# Patient Record
Sex: Male | Born: 1945
Health system: Southern US, Community
[De-identification: ages and names within clinical notes are randomized; demographics above are authoritative.]

## PROBLEM LIST (undated history)

## (undated) DIAGNOSIS — A0472 Enterocolitis due to Clostridium difficile, not specified as recurrent: Secondary | ICD-10-CM

## (undated) DIAGNOSIS — E785 Hyperlipidemia, unspecified: Secondary | ICD-10-CM

## (undated) DIAGNOSIS — K219 Gastro-esophageal reflux disease without esophagitis: Secondary | ICD-10-CM

## (undated) DIAGNOSIS — I739 Peripheral vascular disease, unspecified: Secondary | ICD-10-CM

## (undated) DIAGNOSIS — E039 Hypothyroidism, unspecified: Secondary | ICD-10-CM

## (undated) DIAGNOSIS — Z8619 Personal history of other infectious and parasitic diseases: Secondary | ICD-10-CM

## (undated) DIAGNOSIS — H9319 Tinnitus, unspecified ear: Secondary | ICD-10-CM

## (undated) DIAGNOSIS — R011 Cardiac murmur, unspecified: Secondary | ICD-10-CM

## (undated) DIAGNOSIS — C20 Malignant neoplasm of rectum: Secondary | ICD-10-CM

## (undated) DIAGNOSIS — K420 Umbilical hernia with obstruction, without gangrene: Secondary | ICD-10-CM

## (undated) DIAGNOSIS — M1990 Unspecified osteoarthritis, unspecified site: Secondary | ICD-10-CM

## (undated) DIAGNOSIS — C189 Malignant neoplasm of colon, unspecified: Secondary | ICD-10-CM

## (undated) DIAGNOSIS — D649 Anemia, unspecified: Secondary | ICD-10-CM

## (undated) DIAGNOSIS — K921 Melena: Secondary | ICD-10-CM

## (undated) DIAGNOSIS — B029 Zoster without complications: Secondary | ICD-10-CM

## (undated) DIAGNOSIS — R200 Anesthesia of skin: Secondary | ICD-10-CM

## (undated) DIAGNOSIS — IMO0001 Reserved for inherently not codable concepts without codable children: Secondary | ICD-10-CM

## (undated) DIAGNOSIS — I340 Nonrheumatic mitral (valve) insufficiency: Secondary | ICD-10-CM

## (undated) DIAGNOSIS — H9190 Unspecified hearing loss, unspecified ear: Secondary | ICD-10-CM

## (undated) DIAGNOSIS — I10 Essential (primary) hypertension: Secondary | ICD-10-CM

## (undated) DIAGNOSIS — I8290 Acute embolism and thrombosis of unspecified vein: Secondary | ICD-10-CM

## (undated) DIAGNOSIS — J329 Chronic sinusitis, unspecified: Secondary | ICD-10-CM

## (undated) DIAGNOSIS — IMO0002 Reserved for concepts with insufficient information to code with codable children: Secondary | ICD-10-CM

## (undated) DIAGNOSIS — Z932 Ileostomy status: Secondary | ICD-10-CM

## (undated) DIAGNOSIS — R351 Nocturia: Secondary | ICD-10-CM

## (undated) DIAGNOSIS — I00 Rheumatic fever without heart involvement: Secondary | ICD-10-CM

## (undated) HISTORY — DX: Cardiac murmur, unspecified: R01.1

## (undated) HISTORY — DX: Ileostomy status: Z93.2

## (undated) HISTORY — PX: TONSILLECTOMY: SUR1361

## (undated) HISTORY — DX: Enterocolitis due to Clostridium difficile, not specified as recurrent: A04.72

## (undated) HISTORY — DX: Malignant neoplasm of rectum: C20

## (undated) HISTORY — PX: INGUINAL HERNIA REPAIR: SUR1180

## (undated) HISTORY — DX: Nonrheumatic mitral (valve) insufficiency: I34.0

## (undated) HISTORY — DX: Essential (primary) hypertension: I10

## (undated) HISTORY — DX: Hyperlipidemia, unspecified: E78.5

---

## 2000-06-06 HISTORY — PX: CARPAL TUNNEL RELEASE: SHX101

## 2002-09-11 ENCOUNTER — Ambulatory Visit (HOSPITAL_COMMUNITY): Admission: RE | Admit: 2002-09-11 | Discharge: 2002-09-11 | Payer: Self-pay | Admitting: Cardiology

## 2003-10-08 ENCOUNTER — Ambulatory Visit (HOSPITAL_COMMUNITY): Admission: RE | Admit: 2003-10-08 | Discharge: 2003-10-08 | Payer: Self-pay | Admitting: Cardiology

## 2011-11-15 ENCOUNTER — Other Ambulatory Visit: Payer: Self-pay | Admitting: Cardiology

## 2012-07-11 ENCOUNTER — Encounter: Payer: Self-pay | Admitting: Cardiology

## 2012-07-27 ENCOUNTER — Encounter (INDEPENDENT_AMBULATORY_CARE_PROVIDER_SITE_OTHER): Payer: Self-pay

## 2012-07-27 ENCOUNTER — Encounter (INDEPENDENT_AMBULATORY_CARE_PROVIDER_SITE_OTHER): Payer: Self-pay | Admitting: Surgery

## 2012-07-27 ENCOUNTER — Telehealth: Payer: Self-pay | Admitting: Oncology

## 2012-07-27 ENCOUNTER — Ambulatory Visit (INDEPENDENT_AMBULATORY_CARE_PROVIDER_SITE_OTHER): Payer: Medicare Other | Admitting: Surgery

## 2012-07-27 VITALS — BP 128/78 | HR 80 | Resp 18 | Ht 70.0 in | Wt 187.0 lb

## 2012-07-27 DIAGNOSIS — R152 Fecal urgency: Secondary | ICD-10-CM | POA: Insufficient documentation

## 2012-07-27 DIAGNOSIS — C20 Malignant neoplasm of rectum: Secondary | ICD-10-CM

## 2012-07-27 DIAGNOSIS — I10 Essential (primary) hypertension: Secondary | ICD-10-CM

## 2012-07-27 DIAGNOSIS — R42 Dizziness and giddiness: Secondary | ICD-10-CM | POA: Insufficient documentation

## 2012-07-27 LAB — CBC WITH DIFFERENTIAL/PLATELET
Basophils Absolute: 0.1 10*3/uL (ref 0.0–0.1)
Basophils Relative: 1 % (ref 0–1)
Eosinophils Absolute: 0.5 10*3/uL (ref 0.0–0.7)
Lymphs Abs: 1.4 10*3/uL (ref 0.7–4.0)
MCH: 32.1 pg (ref 26.0–34.0)
MCHC: 34 g/dL (ref 30.0–36.0)
Neutrophils Relative %: 69 % (ref 43–77)
Platelets: 257 10*3/uL (ref 150–400)
RBC: 4.14 MIL/uL — ABNORMAL LOW (ref 4.22–5.81)
RDW: 14 % (ref 11.5–15.5)

## 2012-07-27 MED ORDER — PRAMOXINE HCL 1 % RE FOAM: RECTAL | Status: DC | PRN

## 2012-07-27 NOTE — Patient Instructions (Signed)
Work to get your x-ray and blood studies to help stage the rectal cancer better  We will discuss her case on the GI tumor board with the rest of the specialist.  Meet with the medical and radiation oncology cancer doctors.  Try Proctofoam for the rectal discomfort.  Colorectal Cancer Colorectal cancer is an abnormal growth of tissue (tumor) in the colon or rectum that is cancerous (malignant). Unlike noncancerous (benign) tumors, malignant tumors can spread to other parts of your body. The colon is the large bowel or large intestine. The rectum is the last several inches of the colon. CAUSES  The exact cause of colon cancer is unknown.  RISK FACTORS The majority of patients do not have identifiable risk factors, but the following factors may increase your chances of getting colon cancer:  Age. Most colorectal cancers occur in people older than 50 years.  Having abnormal growths (polyps) on the inner wall of the colon or rectum.  Diabetes.  Being African American.  Family history of hereditary nonpolyposis colon cancer. This condition is caused by changes in the genes that are responsible for repairing mismatched DNA.  Family history of familial adenomatous polyposis (FAP). This is a rare, inherited condition in which hundreds of polyps form in the colon and rectum. It is caused by a change in the APC gene. Unless FAP is treated, it usually leads to colorectal cancer by age 57.  Personal history of cancer. A person who has already had colorectal cancer may develop it a second time. Also, women with a history of ovarian, uterine, or breast cancer are at a somewhat higher risk of developing colorectal cancer.  Inflammatory bowel disease, including ulcerative colitis and Crohn's disease.  Being obese or eating a diet that is high in fat (especially animal fat) and low in fiber, fruits, and vegetables.  Smoking. A person who smokes cigarettes may be at increased risk of developing polyps  and colorectal cancer.  Heavy alcohol use. SYMPTOMS Early colorectal cancer often does not cause symptoms. As the cancer grows, symptoms may include:  Diarrhea.  Constipation.  Feeling like the bowel does not empty completely after a bowel movement.  Blood in the stool.  Stools that are narrower than usual.  Abdominal discomfort, pain, bloating, fullness, or cramps.  Unexplained weight loss.  Constant tiredness.  Nausea and vomiting. DIAGNOSIS  Your caregiver will ask about your medical history. He or she may also perform a number of procedures, such as:  A physical exam.  Blood tests. This may include a routine complete blood count and iron level testing. Your caregiver may also check for carcinoembryonic antigen (CEA) and other substances in the blood. Some people who have colorectal cancer have a high CEA level. These levels may be used to follow the activity of your colon cancer.  Chest X-rays, computed tomography (CT) scans, or magnetic resonance imaging (MRI).  Taking a tissue sample (biopsy) from the colon or rectum. The sample is examined under a microscope to look for cancer cells.  Sigmoidoscopy. With this test, your caregiver can see inside your colon. A thin flexible tube (sigmoidoscope) is placed into your rectum. This device has a light source and a tiny video camera in it. Your caregiver uses the sigmoidoscope to look at the last third of your colon.  Colonoscopy. This test is like sigmoidoscopy, but your caregiver looks at the entire colon. This test usually requires medicine that helps you relax (sedative).  Endorectal ultrasound. With this test, your caregiver can see  how deep a rectal tumor has grown and whether the cancer has spread to lymph nodes or other nearby tissues. A tool (probe) is inserted into the rectum. The probe sends out sound waves to the rectum and nearby tissues, and a computer uses the echoes to create a picture. Your cancer will be staged  to determine its severity and extent. Staging is a careful attempt to find out the size of the tumor, whether the cancer has spread, and if so, to what parts of the body. You may need to have more tests to determine the stage of your cancer. The test results will help determine what treatment plan is best for you. STAGES  Stage 0. The cancer is found only in the innermost lining of the colon or rectum.  Stage I. The cancer has grown into the inner wall of the colon or rectum. The cancer has not yet reached the outer wall of the colon.  Stage II. The cancer extends more deeply into or through the wall of the colon or rectum. It may have invaded nearby tissue, but cancer cells have not spread to the lymph nodes.  Stage III. The cancer has spread to nearby lymph nodes but not to other parts of the body.  Stage IV. The cancer has spread to other parts of the body, such as the liver or lungs. Your caregiver may tell you the detailed stage of your cancer. In that case, the stage will include both a number and a letter. TREATMENT  Depending on the type and stage, colorectal cancer may be treated with surgery, radiation therapy, or chemotherapy. Some patients have a combination of these therapies.  Surgery may be done to remove the polyps from your colon. In early stages, your caregiver may be able to do this during a colonoscopy. In later stages, surgery may be done to remove part of your colon.  Radiation therapy uses high-energy rays to kill cancer cells. This is usually recommended for patients with rectal cancer.  Chemotherapy is the use of drugs to kill cancer cells. Caregivers also give chemotherapy to help reduce pain and other problems caused by colorectal cancer. This may be done even if the cancer is not curable. HOME CARE INSTRUCTIONS   Only take over-the-counter or prescription medicines for pain, discomfort, or fever as directed by your caregiver.  Maintain a healthy diet.  Consider  joining a support group. This may help you learn to cope with the stress of having colorectal cancer.  Seek advice to help you manage treatment side effects.  Keep all follow-up appointments as directed by your caregiver.  Inform your cancer specialist if you are admitted to the hospital. SEEK IMMEDIATE MEDICAL CARE IF:   Your diarrhea or constipation does not go away.  You have alternating constipation and diarrhea.  You have blood in your stools.  Your abdominal pain gets worse.  You lose weight without trying.  You notice new fatigue or weakness.  You develop a fever during chemotherapy treatment. Document Released: 05/23/2005 Document Revised: 08/15/2011 Document Reviewed: 05/10/2011 [facility] Patient Information 2013 [place], [place].  Proctitis Proctitis is the swelling and soreness (inflammation) of the lining of the rectum. The rectum is at the end of the large intestine and is attached to the anus. The inflammation causes pain and discomfort. It may be short-term (acute) or long-lasting (chronic). CAUSES Inflammation in the rectum can be caused by many things, such as:  Sexually transmitted diseases (STDs).  Infection.  Anal-rectal trauma or injury.  Ulcerative colitis or Crohn's disease.  Radiation therapy directed near the rectum.  Antibiotic therapy. SYMPTOMS  Sudden, uncomfortable, and frequent urge to have a bowel movement.  Anal or rectal pain.  Abdominal cramping or pain.  Sensation that the rectum is full.  Rectal bleeding.  Pus or mucus discharge from anus.  Diarrhea or frequent soft, loose stools. DIAGNOSIS Diagnosis may include the following:  A history and physical exam.  An STD test.  Blood tests.  Stool tests.  Rectal culture.  A procedure to evaluate the anal canal (anoscopy).  Procedures to look at part, or the entire large bowel (sigmoidoscopy, colonoscopy). TREATMENT Treatment of proctitis depends on the cause. Reducing  the symptoms of inflammation and eliminating infection are the main goals of treatment. Treatment may include:  Home remedies and lifestyle, such as sitz baths and avoiding food right before bedtime.  Topical ointments, foams, suppositories, or enemas, such as corticosteroids or anti-inflammatories.  Antibiotic or antiviral medicines to treat infection or to control harmful bacteria.  Medicines to control diarrhea, soften stools, and reduce pain.  Medicines to suppress the immune system.  Avoiding the activity that caused rectal trauma.  Nutritional, dietary, or herbal supplements.  Heat or laser therapy for persistent bleeding.  A dilation procedure to enlarge a narrowed rectum.  Surgery, though rare, may be necessary to repair damaged rectal lining. HOME CARE INSTRUCTIONS Only take medicines that are recommended or approved by your caregiver.Do not take anti-diarrhea medicine without your caregiver's approval. SEEK MEDICAL CARE IF:  You often experience one or more of the symptoms noted above.  You keep experiencing symptoms after treatment.  You have questions or concerns about your symptoms or treatment plan. MAKE SURE YOU:  Understand these instructions.  Will watch your condition.  Will get help right away if you are not doing well or get worse. FOR MORE INFORMATION National Institute of Diabetes and Digestive and Kidney Disease (NIDDK): www.digestive.https://bradley.com/ Document Released: 05/12/2011 Document Revised: 08/15/2011 Document Reviewed: 05/12/2011 [facility] Patient Information 2013 [place], [place].  GETTING TO GOOD BOWEL HEALTH. Irregular bowel habits such as constipation and diarrhea can lead to many problems over time.  Having one soft bowel movement a day is the most important way to prevent further problems.  The anorectal canal is designed to handle stretching and feces to safely manage our ability to get rid of solid waste (feces, poop, stool) out of our  body.  BUT, hard constipated stools can act like ripping concrete bricks and diarrhea can be a burning fire to this very sensitive area of our body, causing inflamed hemorrhoids, anal fissures, increasing risk is perirectal abscesses, abdominal pain/bloating, an making irritable bowel worse.     The goal: ONE SOFT BOWEL MOVEMENT A DAY!  To have soft, regular bowel movements:    Drink at least 8 tall glasses of water a day.     Take plenty of fiber.  Fiber is the undigested part of plant food that passes into the colon, acting s "natures broom" to encourage bowel motility and movement.  Fiber can absorb and hold large amounts of water. This results in a larger, bulkier stool, which is soft and easier to pass. Work gradually over several weeks up to 6 servings a day of fiber (25g a day even more if needed) in the form of: o Vegetables -- Root (potatoes, carrots, turnips), leafy green (lettuce, salad greens, celery, spinach), or cooked high residue (cabbage, broccoli, etc) o Fruit -- Fresh (unpeeled skin & pulp), Dried (  prunes, apricots, cherries, etc ),  or stewed ( applesauce)  o Whole grain breads, pasta, etc (whole wheat)  o Bran cereals    Bulking Agents -- This type of water-retaining fiber generally is easily obtained each day by one of the following:  o Psyllium bran -- The psyllium plant is remarkable because its ground seeds can retain so much water. This product is available as Metamucil, Konsyl, Effersyllium, Per Diem Fiber, or the less expensive generic preparation in drug and health food stores. Although labeled a laxative, it really is not a laxative.  o Methylcellulose -- This is another fiber derived from wood which also retains water. It is available as Citrucel. o Polyethylene Glycol - and "artificial" fiber commonly called Miralax or Glycolax.  It is helpful for people with gassy or bloated feelings with regular fiber o Flax Seed - a less gassy fiber than psyllium   No reading or other  relaxing activity while on the toilet. If bowel movements take longer than 5 minutes, you are too constipated   AVOID CONSTIPATION.  High fiber and water intake usually takes care of this.  Sometimes a laxative is needed to stimulate more frequent bowel movements, but    Laxatives are not a good long-term solution as it can wear the colon out. o Osmotics (Milk of Magnesia, Fleets phosphosoda, Magnesium citrate, MiraLax, GoLytely) are safer than  o Stimulants (Senokot, Castor Oil, Dulcolax, Ex Lax)    o Do not take laxatives for more than 7days in a row.    IF SEVERELY CONSTIPATED, try a Bowel Retraining Program: o Do not use laxatives.  o Eat a diet high in roughage, such as bran cereals and leafy vegetables.  o Drink six (6) ounces of prune or apricot juice each morning.  o Eat two (2) large servings of stewed fruit each day.  o Take one (1) heaping tablespoon of a psyllium-based bulking agent twice a day. Use sugar-free sweetener when possible to avoid excessive calories.  o Eat a normal breakfast.  o Set aside 15 minutes after breakfast to sit on the toilet, but do not strain to have a bowel movement.  o If you do not have a bowel movement by the third day, use an enema and repeat the above steps.    Controlling diarrhea o Switch to liquids and simpler foods for a few days to avoid stressing your intestines further. o Avoid dairy products (especially milk & ice cream) for a short time.  The intestines often can lose the ability to digest lactose when stressed. o Avoid foods that cause gassiness or bloating.  Typical foods include beans and other legumes, cabbage, broccoli, and dairy foods.  Every person has some sensitivity to other foods, so listen to our body and avoid those foods that trigger problems for you. o Adding fiber (Citrucel, Metamucil, psyllium, Miralax) gradually can help thicken stools by absorbing excess fluid and retrain the intestines to act more normally.  Slowly increase  the dose over a few weeks.  Too much fiber too soon can backfire and cause cramping & bloating. o Probiotics (such as active yogurt, Align, etc) may help repopulate the intestines and colon with normal bacteria and calm down a sensitive digestive tract.  Most studies show it to be of mild help, though, and such products can be costly. o Medicines:   Bismuth subsalicylate (ex. Kayopectate, Pepto Bismol) every 30 minutes for up to 6 doses can help control diarrhea.  Avoid if pregnant.  Loperamide (Immodium) can slow down diarrhea.  Start with two tablets (4mg  total) first and then try one tablet every 6 hours.  Avoid if you are having fevers or severe pain.  If you are not better or start feeling worse, stop all medicines and call your doctor for advice o Call your doctor if you are getting worse or not better.  Sometimes further testing (cultures, endoscopy, X-ray studies, bloodwork, etc) may be needed to help diagnose and treat the cause of the diarrhea.

## 2012-07-27 NOTE — Telephone Encounter (Signed)
np appt. With Dr. Truett Perna 08/03/12@1 :30 Referring Dr Michaell Cowing Dx- Rectal Mailed np appt. Almira Coaster called pt

## 2012-07-27 NOTE — Progress Notes (Signed)
Subjective:     Patient ID: Tyler Diaz, male   DOB: January 28, 1946, 67 y.o.   MRN: [id number]  HPI  Tyler Diaz  24-Jul-1945 [id number]  Patient Care Team: Horald Pollen, PA as PCP - General (Physician Assistant) Ernestina Penna, MD as Attending Physician (Family Medicine) Ardeth Sportsman, MD as Consulting Physician (Colon and Rectal Surgery) Shirley Friar, MD as Consulting Physician (Gastroenterology)  This patient is a 67 y.o.male who presents today for surgical evaluation at the request of Dr. Bosie Clos.   Reason for visit: Newly diagnosed rectal cancer  Pleasant gentleman with a history of a "leaky valve.  Followed by Dr. Donnie Aho.  Normally has a bowel movement every two days.  Noticed a change in caliber of her stools and rectal bleeding over this past month.  Has been on aspirin and nonsteroidals.  Stop them.  It persisted.  More fecal urgency and discomfort.  Was sent to gastroenterology.  Dr. Bosie Clos performed colonoscopy noted a large bulky mass in the rectum.  15 cm long.  Coming down to the anus.  Biopsy consistent with cancer.  Patient does yard work and is rather active.  Claims to walk 20 minutes without much problem.  Mother died of some unknown abdominal cancer.  Was not resectable do too numerous organs involved (?  Ovarian).  Sister with liver cancer.  No personal nor family history of GI/colon cancer for certain, inflammatory bowel disease, irritable bowel syndrome, allergy such as Celiac Sprue, dietary/dairy problems, colitis, ulcers nor gastritis.  No recent sick contacts/gastroenteritis.  No travel outside the country.  No changes in diet.    Patient Active Problem List  Diagnosis  . Rectal cancer  . HTN (hypertension)  . Vertigo    Past Medical History  Diagnosis Date  . Heart murmur   . HTN (hypertension)   . Headache   . Vertigo     Past Surgical History  Procedure Laterality Date  . Inguinal hernia repair      left side  . Hand surgery  2002     History   Social History  . Marital Status: Married    Spouse Name: N/A    Number of Children: N/A  . Years of Education: N/A   Occupational History  . Not on file.   Social History Main Topics  . Smoking status: Former Smoker    Quit date: 07/27/1972  . Smokeless tobacco: Not on file  . Alcohol Use: No  . Drug Use: No  . Sexually Active: Not on file   Other Topics Concern  . Not on file   Social History Narrative  . No narrative on file    History reviewed. No pertinent family history.  Current Outpatient Prescriptions  Medication Sig Dispense Refill  . hydrochlorothiazide (HYDRODIURIL) 25 MG tablet Take 25 mg by mouth daily.      Marland Kitchen lisinopril (PRINIVIL,ZESTRIL) 20 MG tablet Take 20 mg by mouth daily.      . pravastatin (PRAVACHOL) 20 MG tablet Take 20 mg by mouth daily.       No current facility-administered medications for this visit.     Not on File  BP 128/78  Pulse 80  Resp 18  Ht 5\' 10"  (1.778 m)  Wt 187 lb (84.823 kg)  BMI 26.83 kg/m2  No results found.   Review of Systems  Constitutional: Positive for fatigue. Negative for fever, chills and diaphoresis.  HENT: Positive for hearing loss. Negative for nosebleeds, sore throat,  facial swelling, mouth sores, trouble swallowing and ear discharge.   Eyes: Negative for photophobia, discharge and visual disturbance.  Respiratory: Negative for choking, chest tightness, shortness of breath and stridor.   Cardiovascular: Negative for chest pain and palpitations.  Gastrointestinal: Positive for anal bleeding and rectal pain. Negative for nausea, vomiting, abdominal pain, diarrhea, constipation, blood in stool and abdominal distention.  Endocrine: Negative for cold intolerance and heat intolerance.  Genitourinary: Negative for dysuria, urgency, difficulty urinating and testicular pain.  Musculoskeletal: Positive for arthralgias. Negative for myalgias, back pain and gait problem.  Skin: Negative for color  change, pallor, rash and wound.  Allergic/Immunologic: Negative for environmental allergies and food allergies.  Neurological: Negative for dizziness, speech difficulty, weakness, numbness and headaches.  Hematological: Negative for adenopathy. Bruises/bleeds easily.  Psychiatric/Behavioral: Negative for hallucinations, confusion and agitation.       Objective:   Physical Exam  Constitutional: He is oriented to person, place, and time. He appears well-developed and well-nourished. No distress.  HENT:  Head: Normocephalic.  Mouth/Throat: Oropharynx is clear and moist. No oropharyngeal exudate.  Eyes: Conjunctivae and EOM are normal. Pupils are equal, round, and reactive to light. No scleral icterus.  Neck: Normal range of motion. Neck supple. No tracheal deviation present.  Cardiovascular: Normal rate, regular rhythm and intact distal pulses.   Pulmonary/Chest: Effort normal and breath sounds normal. No respiratory distress.  Abdominal: Soft. He exhibits no distension. There is no tenderness. Hernia confirmed negative in the right inguinal area and confirmed negative in the left inguinal area.  Genitourinary:  Exam done with assistance of male Medical Assistant in the room.  Perianal skin clean with good hygiene.  No pruritis.  Posterior midline anal skin tag.  No other external skin tags / hemorrhoids of significance.  No pilonidal disease.  No fissure.  No abscess/fistula.    Tolerates digital rectal exam.  Normal sphincter tone.   Large bulky tumor involving >50% circumference left anterior to posterior midline.  Somewhat fixed.  3 cm from anal verge.  Within 1 cm of the anal sphincters  Musculoskeletal: Normal range of motion. He exhibits no tenderness.  Lymphadenopathy:    He has no cervical adenopathy.       Right: No inguinal adenopathy present.       Left: No inguinal adenopathy present.  Neurological: He is alert and oriented to person, place, and time. No cranial nerve  deficit. He exhibits normal muscle tone. Coordination normal.  Skin: Skin is warm and dry. No rash noted. He is not diaphoretic. No erythema. No pallor.  Psychiatric: He has a normal mood and affect. His behavior is normal. Judgment and thought content normal.       Assessment:     Large bulky rectal cancer coming close to the sphincters.  Most likely at least T3.     Plan:     I had a long discussion with the patient and his family.  They had many good, appropriate questions.  Try sitz baths and Proctofoam to help with the fecal urgency/proctitis.  Evaluation of local extension:    Given how bulky, uncomfortable/painful, and distal this is, I think an MRI of the pelvis would provide best information.  I do not think he can tolerate an EUS.  Evaluation for distant disease:  CEA, CT chest abdomen pelvis.  GI tumor board evaluation once that information is fair.  Medical and radiation oncology evaluations for neoadjuvant chemoradiation therapy.  He is hoping to have it done up in  more had hospital/[place].  We can coordinate that later.  However, I recommended he get discussion of care at our multidisciplinary conference in [place].  He and his family agree.  This will require proctectomy.  Borderline of sphincter preservation will be possible.  Reasonable to start with neoadjuvant chemoradiation therapy and see if he can be downstaged and provide a little more distance between the sphincters.  Would need at least protective loop ileostomy with coloanal handsewn anastomosis vs. Abdominoperineal resection.  Preoperative ostomy marking will be needed.  The anatomy & physiology of the digestive tract was discussed.  The pathophysiology was discussed.  Natural history risks without surgery was discussed.   I worked to give an overview of the disease and the frequent need to have multispecialty involvement.  I feel the risks of no intervention will lead to serious problems that outweigh the  operative risks; therefore, I recommended a partial proctocolectomy to remove the pathology.  Laparoscopic & open techniques were discussed.  We will work to preserve anal & pelvic floor function without sacrificing cure.  Risks such as bleeding, infection, abscess, leak, reoperation, possible ostomy, hernia, heart attack, death, and other risks were discussed.  I noted a good likelihood this will help address the problem.   Goals of post-operative recovery were discussed as well.  We will work to minimize complications.  An educational handout on the pathology was given as well.  Questions were answered.    The patient expresses understanding & wishes to proceed with surgery.

## 2012-07-28 LAB — COMPREHENSIVE METABOLIC PANEL
ALT: 16 U/L (ref 0–53)
Albumin: 4.3 g/dL (ref 3.5–5.2)
Alkaline Phosphatase: 66 U/L (ref 39–117)
CO2: 28 mEq/L (ref 19–32)
Glucose, Bld: 103 mg/dL — ABNORMAL HIGH (ref 70–99)
Potassium: 4.2 mEq/L (ref 3.5–5.3)
Sodium: 138 mEq/L (ref 135–145)
Total Protein: 6.4 g/dL (ref 6.0–8.3)

## 2012-07-30 ENCOUNTER — Telehealth: Payer: Self-pay | Admitting: *Deleted

## 2012-07-30 ENCOUNTER — Ambulatory Visit (INDEPENDENT_AMBULATORY_CARE_PROVIDER_SITE_OTHER): Payer: Self-pay | Admitting: Surgery

## 2012-07-30 ENCOUNTER — Encounter: Payer: Self-pay | Admitting: Cardiology

## 2012-07-30 ENCOUNTER — Telehealth (INDEPENDENT_AMBULATORY_CARE_PROVIDER_SITE_OTHER): Payer: Self-pay | Admitting: *Deleted

## 2012-07-30 DIAGNOSIS — I340 Nonrheumatic mitral (valve) insufficiency: Secondary | ICD-10-CM | POA: Insufficient documentation

## 2012-07-30 DIAGNOSIS — E781 Pure hyperglyceridemia: Secondary | ICD-10-CM | POA: Insufficient documentation

## 2012-07-30 DIAGNOSIS — E785 Hyperlipidemia, unspecified: Secondary | ICD-10-CM | POA: Insufficient documentation

## 2012-07-30 NOTE — Progress Notes (Signed)
Patient ID: [name], male   DOB: Apr 12, 1946, 67 y.o.   MRN: [id number]    DATE: 07/30/2012         Maanav Dougher              [address]              [place], [place]  [address]      PATIENT: Tyler Diaz, Tyler Diaz ACCOUNT: [id number] Date of Birth: 1945-12-10      MAY PROCEED WITH SURGERY from a cardiac viewpoint on the above named patient. If you have any other concerns please contact us at the number above.  He should be monitored on telemetry following surgery.     Georga Hacking,  M.D.   FACC

## 2012-07-30 NOTE — Telephone Encounter (Signed)
Spoke with patient by phone and confirmed appointments with Dr.Sherrill and Dr. Mitzi Hansen for 08/03/12.  Patient stated he wanted to get the opinions of the physicians in [place], but would be interested in receiving care in [place] at [facility].  Contact names and phone numbers were provided.  Patient was without questions.

## 2012-07-30 NOTE — Progress Notes (Signed)
Patient ID: AIVAN FILLINGIM, male   DOB: [birth year], 67 y.o.   MRN: [id number]  Jeb, Schloemer  Date of visit:  07/11/2012 DOB:  1945-06-29    Age:  67 yrs. Medical record number:  [id number]     Account number:  [id number] Primary Care Provider: Rudi Heap ____________________________ CURRENT DIAGNOSES  1. Mitral Valve-Regurgitation  2. Hypertension-Essential (Benign)  3. Hyperlipidemia ____________________________ ALLERGIES  iodine  Shellfish ____________________________ MEDICATIONS  1. Afrin 0.025%, PRN  2. Centrum Silver tablet, 1 p.o. daily  3. lisinopril 20 mg tablet, 2 p.o. daily  4. pravastatin 20 mg tablet, 1 p.o. daily  5. hydrochlorothiazide 25 mg tablet, 1 p.o. daily ____________________________ CHIEF COMPLAINTS  Followup of Hypertension-Essential (Benign)  Followup of Mitral Valve-Regurgitation ____________________________ HISTORY OF PRESENT ILLNESS  Patient seen for cardiac followup. He tells me he has been having some hematochezia and tenesmus and has seen his primary physician. He is due to have a colonoscopy coming up that is trying to be scheduled. He does note some fatigue but does not have any PND or orthopnea. He does not have claudication. He has been able to do recently vigorous work. He does have a known partially flail posterior mitral valve leaflet.____________________________ PAST HISTORY  Past Medical Illnesses:  hypertension, asthma, rheumatic fever;  Cardiovascular Illnesses:  mitral regurgitation due to partially torn leaflet;  Surgical Procedures:  carpal tunnel release, inguinal herniorrhaphy-left, tonsillectomy;  Cardiology Procedures-Invasive:  no previous interventional or invasive cardiology procedures;  Cardiology Procedures-Noninvasive:  echocardiogram December 2012, echocardiogram February 2014;  LVEF of 60% documented via echocardiogram on 07/11/2012 ____________________________ CARDIO-PULMONARY TEST DATES EKG Date:  11/18/2010;  Echocardiography  Date: 07/11/2012;   ____________________________ SOCIAL HISTORY Alcohol Use:  no alcohol use;  Smoking:  used to smoke but quit Prior to 1980;  Diet:  regular diet without modifications;  Lifestyle:  married;  Exercise:  no regular exercise;  Occupation:  retired Development worker, international aid;  Residence:  lives with wife and son also at home;   ____________________________ REVIEW OF SYSTEMS General:  denies recent weight change, fatique or change in exercise tolerance.  Ears, Nose, Throat, Mouth:  partial hearing loss, tinnitus, seasonal sinusitis  Respiratory:  denies dyspnea, cough, wheezing or hemoptysis.  Cardiovascular:  please review HPI  Abdominal:  rectal bleeding  Genitourinary-Male:  nocturia  Musculoskeletal:  arthritis of the hands, arthritis of the left knee   ____________________________ PHYSICAL EXAMINATION VITAL SIGNS  Blood Pressure:  100/60 Sitting, Left arm, regular cuff  , 104/62 Standing, Left arm and regular cuff   Pulse:  120/min. Weight:  188.00 lbs. Height:  70"BMI: 27  Constitutional:  pleasant white male in no acute distress Skin:  warm and dry to touch, no apparent skin lesions, or masses noted. Head:  normocephalic, balding male hair pattern ENT:  ears, nose and throat reveal no gross abnormalities.  Dentition good. Neck:  supple, no masses, thyromegaly, JVD. Carotid pulses are full and equal bilaterally without bruits. Chest:  normal symmetry, clear to auscultation and percussion. Cardiac:  regular rhythm, grade 3/6 systolic murmur left sternal border radiating to the apex radiating to the base Peripheral Pulses:  the femoral,dorsalis pedis, and posterior tibial pulses are full and equal bilaterally with no bruits auscultated. Extremities & Back:  no deformities, clubbing, cyanosis, erythema or edema observed. Normal muscle strength and tone. Neurological:  no gross motor or sensory deficits noted, affect appropriate, oriented x3. ____________________________ MOST  RECENT LIPID PANEL 11/14/11  CHOL TOTL 165 mg/dl, LDL 99 calc,  HDL 31 mg/dl, TRIGLYCER 161 mg/dl and CHOL/HDL 5.3 (Calc) ____________________________ IMPRESSIONS/PLAN  1. Mitral regurgitation due to partially torn posterior mitral valve leaflet 2. Hypertension controlled 3. Recent hematochezia  Recommendations:  He is in the process of having his hematochezia evaluated. My recommendations are for him to get this out of the way and taken care of. We discussed what to do about his partially flail posterior leaflet. On echocardiogram today his ventricular dimensions are preserved. He is somewhat more fatigued than he was previously and his atrial size is not huge. He would need to have probable surgical repair of this but is really not that symptomatic. He really wanted to get the other thing taken care of first and  we will visit this at his appointment down the road. If he needs surgery to address the hematochezia, he would be an acceptable candidate for this.  ____________________________ TODAYS ORDERS  1. Return Visit: 6 months  2. 12 Lead EKG: 6 months                       ____________________________ Cardiology Physician:  Darden Palmer MD [facility]

## 2012-07-30 NOTE — Telephone Encounter (Signed)
Patient called to state prescription for Pramoxine 1% is $83 and he can not afford prescription.  Spoke to pharmacist who recommended Hydrocortisone 2.5% cream every 4 hours as needed for hemorrhoids.  Use for rectal pain.  Spoke to Prince MD who approved change in prescription.  Patient informed of new prescription and will go to pick up.

## 2012-07-31 ENCOUNTER — Encounter (INDEPENDENT_AMBULATORY_CARE_PROVIDER_SITE_OTHER): Payer: Self-pay

## 2012-08-02 ENCOUNTER — Ambulatory Visit (HOSPITAL_COMMUNITY)
Admission: RE | Admit: 2012-08-02 | Discharge: 2012-08-02 | Disposition: A | Discharge status: Home / Self Care | Payer: Medicare Other | Source: Ambulatory Visit | Attending: Surgery | Admitting: Surgery

## 2012-08-02 ENCOUNTER — Encounter: Payer: Self-pay | Admitting: Radiation Oncology

## 2012-08-02 ENCOUNTER — Encounter (HOSPITAL_COMMUNITY): Payer: Self-pay

## 2012-08-02 ENCOUNTER — Ambulatory Visit (HOSPITAL_COMMUNITY): Payer: Medicare Other

## 2012-08-02 DIAGNOSIS — C20 Malignant neoplasm of rectum: Secondary | ICD-10-CM | POA: Insufficient documentation

## 2012-08-02 DIAGNOSIS — K8020 Calculus of gallbladder without cholecystitis without obstruction: Secondary | ICD-10-CM | POA: Insufficient documentation

## 2012-08-02 DIAGNOSIS — R42 Dizziness and giddiness: Secondary | ICD-10-CM

## 2012-08-02 DIAGNOSIS — I10 Essential (primary) hypertension: Secondary | ICD-10-CM

## 2012-08-02 DIAGNOSIS — Q619 Cystic kidney disease, unspecified: Secondary | ICD-10-CM | POA: Insufficient documentation

## 2012-08-02 DIAGNOSIS — C775 Secondary and unspecified malignant neoplasm of intrapelvic lymph nodes: Secondary | ICD-10-CM | POA: Insufficient documentation

## 2012-08-02 DIAGNOSIS — R911 Solitary pulmonary nodule: Secondary | ICD-10-CM | POA: Insufficient documentation

## 2012-08-02 DIAGNOSIS — D3500 Benign neoplasm of unspecified adrenal gland: Secondary | ICD-10-CM | POA: Insufficient documentation

## 2012-08-02 MED ORDER — IOHEXOL 300 MG/ML  SOLN
100.0000 mL | Freq: Once | INTRAMUSCULAR | Status: AC | PRN
  Administered 2012-08-02: 100 mL via INTRAVENOUS

## 2012-08-02 MED ORDER — GADOBENATE DIMEGLUMINE 529 MG/ML IV SOLN
17.0000 mL | Freq: Once | INTRAVENOUS | Status: AC | PRN
  Administered 2012-08-02: 17 mL via INTRAVENOUS

## 2012-08-02 NOTE — Progress Notes (Signed)
New Consult Rectal Cancer Bx 05/17/13= 15 cm length,  Invasive Adenocarcinoma with extracellular mucin Married,1 son,Retired Scientist, research (physical sciences)  Allergies: Shellfish  No history Radiation No history of a Pacemaker

## 2012-08-03 ENCOUNTER — Ambulatory Visit (HOSPITAL_BASED_OUTPATIENT_CLINIC_OR_DEPARTMENT_OTHER): Payer: Medicare Other | Admitting: Oncology

## 2012-08-03 ENCOUNTER — Ambulatory Visit (HOSPITAL_BASED_OUTPATIENT_CLINIC_OR_DEPARTMENT_OTHER): Payer: Medicare Other

## 2012-08-03 ENCOUNTER — Encounter: Payer: Self-pay | Admitting: Radiation Oncology

## 2012-08-03 ENCOUNTER — Ambulatory Visit
Admission: RE | Admit: 2012-08-03 | Discharge: 2012-08-03 | Disposition: A | Discharge status: Home / Self Care | Payer: Medicare Other | Source: Ambulatory Visit | Attending: Radiation Oncology | Admitting: Radiation Oncology

## 2012-08-03 ENCOUNTER — Other Ambulatory Visit: Payer: Self-pay | Admitting: *Deleted

## 2012-08-03 ENCOUNTER — Encounter: Payer: Self-pay | Admitting: Oncology

## 2012-08-03 VITALS — BP 139/80 | HR 96 | Temp 97.6°F | Resp 18 | Ht 69.0 in | Wt 187.3 lb

## 2012-08-03 VITALS — BP 139/80 | HR 96 | Temp 97.4°F | Resp 18 | Ht 70.75 in | Wt 188.3 lb

## 2012-08-03 DIAGNOSIS — R911 Solitary pulmonary nodule: Secondary | ICD-10-CM

## 2012-08-03 DIAGNOSIS — C20 Malignant neoplasm of rectum: Secondary | ICD-10-CM | POA: Insufficient documentation

## 2012-08-03 DIAGNOSIS — Z79899 Other long term (current) drug therapy: Secondary | ICD-10-CM | POA: Insufficient documentation

## 2012-08-03 DIAGNOSIS — K6289 Other specified diseases of anus and rectum: Secondary | ICD-10-CM

## 2012-08-03 DIAGNOSIS — I10 Essential (primary) hypertension: Secondary | ICD-10-CM | POA: Insufficient documentation

## 2012-08-03 DIAGNOSIS — E785 Hyperlipidemia, unspecified: Secondary | ICD-10-CM | POA: Insufficient documentation

## 2012-08-03 DIAGNOSIS — Z87891 Personal history of nicotine dependence: Secondary | ICD-10-CM | POA: Insufficient documentation

## 2012-08-03 DIAGNOSIS — R970 Elevated carcinoembryonic antigen [CEA]: Secondary | ICD-10-CM

## 2012-08-03 DIAGNOSIS — C775 Secondary and unspecified malignant neoplasm of intrapelvic lymph nodes: Secondary | ICD-10-CM | POA: Insufficient documentation

## 2012-08-03 HISTORY — DX: Rheumatic fever without heart involvement: I00

## 2012-08-03 HISTORY — DX: Chronic sinusitis, unspecified: J32.9

## 2012-08-03 HISTORY — DX: Unspecified osteoarthritis, unspecified site: M19.90

## 2012-08-03 HISTORY — DX: Tinnitus, unspecified ear: H93.19

## 2012-08-03 HISTORY — DX: Unspecified hearing loss, unspecified ear: H91.90

## 2012-08-03 HISTORY — DX: Melena: K92.1

## 2012-08-03 HISTORY — DX: Malignant neoplasm of colon, unspecified: C18.9

## 2012-08-03 MED ORDER — POLYETHYLENE GLYCOL 3350 17 GM/SCOOP PO POWD: 17.0000 g | Freq: Every day | ORAL | Status: DC

## 2012-08-03 MED ORDER — TRAMADOL HCL 50 MG PO TABS: 50.0000 mg | ORAL_TABLET | Freq: Four times a day (QID) | ORAL | Status: DC | PRN

## 2012-08-03 NOTE — Progress Notes (Signed)
Checked in new patient. No financial issues. °

## 2012-08-03 NOTE — Progress Notes (Signed)
See progress note under physician encounter. 

## 2012-08-03 NOTE — Progress Notes (Signed)
[facility] New Patient Consult   Referring MD: Zidan Helget 67 y.o.  1945/08/03    Reason for Referral:  Rectal cancer      HPI: He noticed rectal urgency approximately 4 months ago. He developed rectal bleeding 2 months ago. He was referred to Dr. Bosie Clos and underwent a colonoscopy on 07/18/2012. Nodular mucosa was palpated on rectal exam and a fungating mass lesion was noted in the rectum. The mass measured 15 cm in length and appeared to be near the dentate line. A biopsy was obtained. A sessile polyp was noted in the cecum. A sessile polyp was found in the proximal a sending colon. 2 small polyps were noted in the ascending colon, resection not attempted.  The pathology from the rectum biopsy confirmed invasive adenocarcinoma. The cecal and ascending colon polyps were tubular adenomas.  He was referred to Dr. Michaell Cowing and was noted to have a large tumor in the rectum at 3 cm from the anal verge within 1 cm of the anal sphincters.  He is referred to consider neoadjuvant therapy.  CTs of the chest, abdomen, and pelvis on 08/02/2012 revealed small bilateral pulmonary nodules measuring up to 6 mm. The liver appeared normal. Rectal mass with suspected infiltration of the surrounding perirectal fat. Associated perirectal lymph node metastases. 10 mm short axis gastrohepatic node.  An MRI of the pelvis on 08/02/2012 confirmed a bulky rectal mass abutting the posterior margin of the prostate gland. Perirectal lymphadenopathy was seen bilaterally with the largest node measuring 11 mm.   He complains of rectal pain that is partially relieved with Tylenol.  Past Medical History  Diagnosis Date  . Mitral regurgitation due to partially flail posterior mitral valve leaflet   . Rectal cancer  07/18/2012       . Hyperlipidemia           . Hypertension     essential (benign)  . Rheumatic fever  childhood     hx  . Hearing loss     partial greater on left    . Tinnitus   . Sinusitis     seasonal  .        . Arthritis     hands/knees  .        Marland Kitchen      Past Surgical History  Procedure Laterality Date  . Inguinal hernia repair  1982    left side  . Carpal tunnel release  2002  . Tonsillectomy      as child    Family History  Problem Relation Age of Onset  . Cancer Mother      "stomach "  . Cancer Sister     liver cancer    Current outpatient prescriptions:hydrochlorothiazide (HYDRODIURIL) 25 MG tablet, Take 25 mg by mouth daily., Disp: , Rfl: ;  hydrocortisone 2.5 % cream, , Disp: , Rfl: ;  lisinopril (PRINIVIL,ZESTRIL) 20 MG tablet, Take 20 mg by mouth 2 (two) times daily. , Disp: , Rfl: ;  pravastatin (PRAVACHOL) 20 MG tablet, Take 20 mg by mouth daily., Disp: , Rfl:  polyethylene glycol powder (GLYCOLAX/MIRALAX) powder, Take 17 g by mouth daily. Use 17 g in water and drink every day, Disp: 527 g, Rfl: 1;  traMADol (ULTRAM) 50 MG tablet, Take 1 tablet (50 mg total) by mouth every 6 (six) hours as needed for pain., Disp: 30 tablet, Rfl: 2  Allergies:  Allergies  Allergen Reactions  . Shellfish Allergy  Social History: He lives in [place], he is retired from a Arts development officer. He quit smoking cigarettes 35 years ago. No alcohol use. No transfusion history. No risk factors for HIV or hepatitis.  ROS:   Positives include: Recent "cold ", rectal pain, rectal bleeding, rectal urgency, pruritus at the left lateral chest wall and right leg  A complete ROS was otherwise negative.  Physical Exam:  Blood pressure 139/80, pulse 96, temperature 97.4 F (36.3 C), temperature source Oral, resp. rate 18, height 5' 10.75" (1.797 m), weight 188 lb 4.8 oz (85.412 kg).  HEENT: Oropharynx without visible mass, neck without mass Lungs: Clear bilaterally Cardiac: Regular rate and rhythm, 2/6 systolic murmur heard throughout the precordium and best at the apex Abdomen: No hepatomegaly, nontender, no mass GU: Testes without mass  Vascular: No  leg edema Lymph nodes: No cervical, supra-clavicular, axillary, or inguinal nodes Neurologic: Alert and oriented, the motor exam appears intact in the upper and lower extremities Skin: No rash   LAB:  CBC  Lab Results  Component Value Date   WBC 10.3 07/27/2012   HGB 13.3 07/27/2012   HCT 39.1 07/27/2012   MCV 94.4 07/27/2012   PLT 257 07/27/2012   ANC 7.2  CMP      Component Value Date/Time   NA 138 07/27/2012 1425   K 4.2 07/27/2012 1425   CL 100 07/27/2012 1425   CO2 28 07/27/2012 1425   GLUCOSE 103* 07/27/2012 1425   BUN 23 07/27/2012 1425   CREATININE 1.25 07/27/2012 1425   CALCIUM 9.5 07/27/2012 1425   PROT 6.4 07/27/2012 1425   ALBUMIN 4.3 07/27/2012 1425   AST 13 07/27/2012 1425   ALT 16 07/27/2012 1425   ALKPHOS 66 07/27/2012 1425   BILITOT 0.6 07/27/2012 1425   CEA-12.9  Radiology: As per history of present illness, I reviewed the 08/02/2012 CT images with Mr. Jain and his family    Assessment/Plan:   1. Rectal cancer, locally advanced-clinical stage III disease based on CT/pelvic MRI evaluations 2. Small bilateral lung nodules and mildly enlarged gastrohepatic node-nonspecific 3. Elevated CEA 4. Rectal pain/urgency/bleeding 5. Mitral regurgitation 6. Hypertension 7. History of rheumatic fever   Disposition:   He has been diagnosed with locally advanced rectal cancer. I discussed the diagnosis and treatment options with Mr. Dansereau and his family. He appears to have locally advanced disease based on the CT/MRI staging studies. I recommend neoadjuvant radiation and concurrent Xeloda.  I reviewed the potential toxicities associated with Xeloda including a chance for mucositis, diarrhea, and hematologic toxicity. We discussed the skin rash, hyperpigmentation, and hand/foot syndrome associated with Xeloda.  He is scheduled to see Dr. Mitzi Hansen later today.  Mr. Dubray indicated he would like to receive this treatment in [place]. We will make a referral to Dr. Ubaldo Glassing. Dr. Mitzi Hansen  will arrange for a radiation oncology referral.  We prescribed tramadol to use as needed for pain. He will continue MiraLAX. Mr. Harewood is not scheduled for a followup appointment at the [facility]. I will be glad to see him in the future as needed.  Lauro Manlove 08/03/2012, 5:40 PM

## 2012-08-03 NOTE — Progress Notes (Signed)
Patient here with wife and 2 sons for radiation consultation of newly diagnosed rectal cancer.Plan is for concurrent chemo/radiation (xeloda)Patient want radiation to be done in [place] as this is closer to where he lives.

## 2012-08-04 NOTE — Progress Notes (Signed)
Radiation Oncology         ([phone]) [phone] ________________________________  Name: Tyler Diaz MRN: [id number]  Date: 08/03/2012  DOB: November 08, 1945  JY:NWGNF,AOZHYQ C., PA  Michaell Cowing Salvatore Decent., MD   Lance Bosch, MD   Earma Reading, MD  REFERRING PHYSICIAN: Michaell Cowing Salvatore Decent., MD   DIAGNOSIS: The encounter diagnosis was Rectal cancer.   HISTORY OF PRESENT ILLNESS::Tyler Diaz is a 67 y.o. male who is seen for an initial consultation visit. The patient indicates that he had an episode of rectal urgency approximately 4 months ago. Since that time he has noticed some rectal bleeding and a change in the caliber of his stools. This has been present for the last couple of months. This prompted further workup and the patient underwent a colonoscopy on 07/18/2012. An ulcerated partially obstructing large mass was found within the rectum. This was partially circumferential and the mass measured 15 cm in length. This appeared to be near the dentate line and extend to 15 cm from the anus. Biopsies were performed and these have returned positive for an invasive adenocarcinoma with extracellular mucin. A cecal polyp as well as a colon polyp from the ACE in the: Both did not display malignant features.  The patient's workup has further included a CT scan of the chest abdomen and pelvis. Some small nonspecific subcentimeter bilateral pulmonary nodules were present. A rectal mass was seen with suspected infiltration of the surrounding perirectal fat within the pelvis. There was also associated perirectal nodal metastases measuring up to 10 mm short axis. There was an additional 10 mm short axis nonspecific gastrohepatic node. Further workup has included an MRI scan of the pelvis. This showed a bulky carcinoma involving the distal rectum with a length of approximately 7 cm and extension to the anorectal junction. There is also bilateral peri-rectal lymphadenopathy.  The patient has seen Dr. Michaell Cowing who has examined the  patient and feels that neoadjuvant chemoradiotherapy would be an appropriate consideration. The patient has seen Dr. Truett Perna in medical oncology earlier today and I been asked to see the patient as well in this regard.  The patient is currently taking MiraLax and also has been given some stronger pain medicine. He does complain of some irritation and occasional more severe pain in the pelvic region.   PREVIOUS RADIATION THERAPY: No   PAST MEDICAL HISTORY:  has a past medical history of Mitral regurgitation due to partially flail posterior mitral valve leaflet; Rectal cancer; Bronchitis; Hyperlipidemia; Colon cancer (07/18/12 bx); Hypertension; Rheumatic fever; Hearing loss; Tinnitus; Sinusitis; Rectal bleeding; Arthritis; Hematochezia; and Heart murmur.     PAST SURGICAL HISTORY: Past Surgical History  Procedure Laterality Date  . Inguinal hernia repair  1982    left side  . Carpal tunnel release  2002  . Tonsillectomy      as child     FAMILY HISTORY: family history includes Cancer in his mother and sister.   SOCIAL HISTORY:  reports that he quit smoking about 40 years ago. His smoking use included Cigarettes. He has a 30 pack-year smoking history. He has never used smokeless tobacco. He reports that he does not drink alcohol or use illicit drugs.   ALLERGIES: Shellfish allergy   MEDICATIONS:  Current Outpatient Prescriptions  Medication Sig Dispense Refill  . hydrochlorothiazide (HYDRODIURIL) 25 MG tablet Take 25 mg by mouth daily.      . hydrocortisone 2.5 % cream       . lisinopril (PRINIVIL,ZESTRIL) 20 MG tablet Take 20 mg  by mouth 2 (two) times daily.       . polyethylene glycol powder (GLYCOLAX/MIRALAX) powder Take 17 g by mouth daily. Use 17 g in water and drink every day  527 g  1  . pravastatin (PRAVACHOL) 20 MG tablet Take 20 mg by mouth daily.      . traMADol (ULTRAM) 50 MG tablet Take 1 tablet (50 mg total) by mouth every 6 (six) hours as needed for pain.  30  tablet  2   No current facility-administered medications for this encounter.     REVIEW OF SYSTEMS:  A 15 point review of systems is documented in the electronic medical record. This was obtained by the nursing staff. However, I reviewed this with the patient to discuss relevant findings and make appropriate changes.  Pertinent items are noted in HPI.    PHYSICAL EXAM:  height is 5\' 9"  (1.753 m) and weight is 187 lb 4.8 oz (84.959 kg). His oral temperature is 97.6 F (36.4 C). His blood pressure is 139/80 and his pulse is 96. His respiration is 18.   General: Well-developed, in no acute distress HEENT: Normocephalic, atraumatic; oral cavity clear Neck: Supple without any lymphadenopathy Cardiovascular: Regular rate and rhythm Respiratory: Clear to auscultation bilaterally GI: Soft, nontender, normal bowel sounds Extremities: No edema present Neuro: No focal deficits Rectal: deferred at patient's request    LABORATORY DATA:  Lab Results  Component Value Date   WBC 10.3 07/27/2012   HGB 13.3 07/27/2012   HCT 39.1 07/27/2012   MCV 94.4 07/27/2012   PLT 257 07/27/2012   Lab Results  Component Value Date   NA 138 07/27/2012   K 4.2 07/27/2012   CL 100 07/27/2012   CO2 28 07/27/2012   Lab Results  Component Value Date   ALT 16 07/27/2012   AST 13 07/27/2012   ALKPHOS 66 07/27/2012   BILITOT 0.6 07/27/2012      RADIOGRAPHY: Ct Chest W Contrast  08/02/2012  *RADIOLOGY REPORT*  Clinical Data:  Newly diagnosed rectal cancer  CT CHEST, ABDOMEN AND PELVIS WITH CONTRAST  Technique:  Multidetector CT imaging of the chest, abdomen and pelvis was performed following the standard protocol during bolus administration of intravenous contrast.  Contrast: OMNIPAQUE IOHEXOL 300 MG/ML  SOLN  Comparison:  None.  CT CHEST  Findings:  Small bilateral pulmonary nodules, including: --2 mm nodule in the right upper lobe (series 5/image 16) --4 mm nodule in the posterior right upper lobe (series 5/image  19) --3 mm nodule in the left upper lobe (series 5/image 27) --6 x 5 mm nodule in the right lower lobe (series 5/image 32) --3 mm nodule in the right lower lobe (series 5/image 38)  No pleural effusion or pneumothorax.  The visualized thyroid is unremarkable.  The heart is normal in size.  No pericardial effusion.  Small mediastinal lymph nodes which do not meet pathologic CT size criteria.  No suspicious hilar or axillary lymphadenopathy.  Mild degenerative changes of the thoracic spine.  IMPRESSION: Small bilateral pulmonary nodules, measuring up to 6 mm in the right lower lobe, as described above.  While nonspecific, metastases are not excluded.  Attention on follow-up is suggested.  CT ABDOMEN AND PELVIS  Findings:  Small hiatal hernia.  Liver, spleen, pancreas, and left adrenal gland are within normal limits.  9 mm right adrenal myelolipoma (series 2/image 58).  Numerous gallstones (series 2/image 58), without associated inflammatory changes.  No intrahepatic or extrahepatic ductal dilatation.  7 mm  right renal cortical cyst (series 4/image 13).  Additional right renal sinus cysts. Left kidney is within normal limits.  No hydronephrosis.  No evidence of bowel obstruction.  Normal appendix.  Eccentric rectal mass, measuring at least 2.5 cm in thickness (series 2/image 119), compatible with known rectal cancer. Suspected infiltration of the surrounding perirectal fat (series 2/image 120).  Associated perirectal nodal metastases measuring up to 10 mm short axis (series 2/image 113).  These are within 4 mm of the mesorectal fascia.  10 mm short axis gastrohepatic node (series 2/image 55).  No evidence of abdominal aortic aneurysm.  No abdominopelvic ascites.  Prostatomegaly, measuring 5.9 cm in transverse dimension. Although adjacent to the primary rectal mass, there is no definite direct invasion.  Degenerative changes of the lumbar spine.  IMPRESSION: Rectal mass, corresponding to known rectal cancer, with  suspected infiltration of the surrounding perirectal fat.  Associated perirectal nodal metastases measuring up to 10 mm short axis, within 4 mm of the mesorectal fascia.  10 mm short-axis gastrohepatic node, nonspecific.  Attention on follow-up is suggested.  Cholelithiasis, without associated inflammatory changes.  9 mm right adrenal myelolipoma, benign.   Original Report Authenticated By: Charline Bills, M.D.    Mr Pelvis W Wo Contrast  08/02/2012  *RADIOLOGY REPORT*  Clinical Data: Newly diagnosed rectal carcinoma.  Staging.  MRI PELVIS WITHOUT AND WITH CONTRAST  Technique:  Multiplanar multisequence MR imaging of the pelvis was performed both before and after administration of intravenous contrast.  Contrast: 17mL MULTIHANCE GADOBENATE DIMEGLUMINE 529 MG/ML IV SOLN  Comparison: CT on 08/02/2012  Findings: A bulky rectal soft tissue mass is seen which measures approximately 7 cm in length and extends to the anorectal junction. This mass measures approximately 4.8 x 6.5 cm in maximum AP transverse dimensions and abuts the posterior margin of the prostate gland.  Perirectal lymphadenopathy is seen bilaterally, with largest lymph nodes measuring 11 mm in short axis and abutting the circumferential resection margin in the right lateral and posterolateral perirectal space. No other lymphadenopathy seen within the pelvis.  Mildly enlarged prostate gland is seen.  Seminal vesicles are symmetric. Moderate diverticulosis of the sigmoid colon is noted, however there is no evidence of diverticulitis.  IMPRESSION:  1.  Bulky carcinoma involving the distal rectum, with length of approximately 7 cm and extension to the anorectal junction. 2.  Bilateral perirectal lymphadenopathy, with largest lymph nodes measuring 11 mm and abutting the circumferential resection margin in the right lateral and posterolateral perirectal space. 3.  Mildly enlarged prostate.  The rectal carcinoma abuts the posterior aspect the prostate,  without evidence of invasion. 4.  Moderate sigmoid diverticulosis, without evidence of diverticulitis.   Original Report Authenticated By: Myles Rosenthal, M.D.    Ct Abdomen Pelvis W Contrast  08/02/2012  *RADIOLOGY REPORT*  Clinical Data:  Newly diagnosed rectal cancer  CT CHEST, ABDOMEN AND PELVIS WITH CONTRAST  Technique:  Multidetector CT imaging of the chest, abdomen and pelvis was performed following the standard protocol during bolus administration of intravenous contrast.  Contrast: OMNIPAQUE IOHEXOL 300 MG/ML  SOLN  Comparison:  None.  CT CHEST  Findings:  Small bilateral pulmonary nodules, including: --2 mm nodule in the right upper lobe (series 5/image 16) --4 mm nodule in the posterior right upper lobe (series 5/image 19) --3 mm nodule in the left upper lobe (series 5/image 27) --6 x 5 mm nodule in the right lower lobe (series 5/image 32) --3 mm nodule in the right lower lobe (series 5/image  38)  No pleural effusion or pneumothorax.  The visualized thyroid is unremarkable.  The heart is normal in size.  No pericardial effusion.  Small mediastinal lymph nodes which do not meet pathologic CT size criteria.  No suspicious hilar or axillary lymphadenopathy.  Mild degenerative changes of the thoracic spine.  IMPRESSION: Small bilateral pulmonary nodules, measuring up to 6 mm in the right lower lobe, as described above.  While nonspecific, metastases are not excluded.  Attention on follow-up is suggested.  CT ABDOMEN AND PELVIS  Findings:  Small hiatal hernia.  Liver, spleen, pancreas, and left adrenal gland are within normal limits.  9 mm right adrenal myelolipoma (series 2/image 58).  Numerous gallstones (series 2/image 58), without associated inflammatory changes.  No intrahepatic or extrahepatic ductal dilatation.  7 mm right renal cortical cyst (series 4/image 13).  Additional right renal sinus cysts. Left kidney is within normal limits.  No hydronephrosis.  No evidence of bowel obstruction.  Normal  appendix.  Eccentric rectal mass, measuring at least 2.5 cm in thickness (series 2/image 119), compatible with known rectal cancer. Suspected infiltration of the surrounding perirectal fat (series 2/image 120).  Associated perirectal nodal metastases measuring up to 10 mm short axis (series 2/image 113).  These are within 4 mm of the mesorectal fascia.  10 mm short axis gastrohepatic node (series 2/image 55).  No evidence of abdominal aortic aneurysm.  No abdominopelvic ascites.  Prostatomegaly, measuring 5.9 cm in transverse dimension. Although adjacent to the primary rectal mass, there is no definite direct invasion.  Degenerative changes of the lumbar spine.  IMPRESSION: Rectal mass, corresponding to known rectal cancer, with suspected infiltration of the surrounding perirectal fat.  Associated perirectal nodal metastases measuring up to 10 mm short axis, within 4 mm of the mesorectal fascia.  10 mm short-axis gastrohepatic node, nonspecific.  Attention on follow-up is suggested.  Cholelithiasis, without associated inflammatory changes.  9 mm right adrenal myelolipoma, benign.   Original Report Authenticated By: Charline Bills, M.D.        IMPRESSION: The patient is a pleasant 67 year old male who is presenting with locally advanced rectal cancer. On his workup thus far this appears to represent a least a T3 tumor with nodal involvement. I believe that the patient is a good candidate for neoadjuvant chemoradiotherapy followed by definitive surgical resection. No evidence of distant disease although there were a couple of findings for which further attention was recommended on future scans. The patient has seen Dr. Truett Perna who believes that concurrent xeloda would be appropriate for him. I discussed with the patient a potential 5-1/2 week course of radiotherapy. I did discuss with him in general the side effects and risks of treatment. We discussed in detail the rationale of this treatment in terms of  improvement in local control as well as giving [place] the best chance possible for a more limited surgical procedure. All of the patient's questions were answered.   PLAN: [place] if possible. I certainly think that this would be appropriate for him. I will therefore make a request for the patient to be seen by Dr. Thersa Salt and Dr. Truett Perna is also making a request for the patient to be seen by medical oncology.   The patient knows to contact our office if we can be of any further assistance.    I spent 60 minutes minutes face to face with the patient and more than 50% of that time was spent in counseling and/or  coordination of care.    ________________________________   Radene Gunning, MD, PhD

## 2012-08-04 NOTE — Addendum Note (Signed)
Encounter addended by: Jonna Coup, MD on: 08/04/2012  5:33 PM<BR>     Documentation filed: Problem List, Visit Diagnoses, Flowsheet VN, Notes Section

## 2012-08-06 ENCOUNTER — Telehealth: Payer: Self-pay | Admitting: *Deleted

## 2012-08-06 NOTE — Telephone Encounter (Signed)
Called patient to inform of appt. With Dr. Wynonia Hazard on March 7, arrival time - 10:15 am, spoke with patient's wife Corrie Dandy and she is aware of this appt.

## 2012-08-13 DIAGNOSIS — C20 Malignant neoplasm of rectum: Secondary | ICD-10-CM

## 2012-08-20 ENCOUNTER — Encounter: Payer: Medicare Other | Admitting: Internal Medicine

## 2012-08-20 DIAGNOSIS — C20 Malignant neoplasm of rectum: Secondary | ICD-10-CM

## 2012-08-24 ENCOUNTER — Encounter (INDEPENDENT_AMBULATORY_CARE_PROVIDER_SITE_OTHER): Payer: Self-pay | Admitting: Surgery

## 2012-09-03 ENCOUNTER — Encounter (INDEPENDENT_AMBULATORY_CARE_PROVIDER_SITE_OTHER): Payer: Medicare Other | Admitting: Internal Medicine

## 2012-09-03 DIAGNOSIS — C20 Malignant neoplasm of rectum: Secondary | ICD-10-CM

## 2012-10-01 ENCOUNTER — Encounter (INDEPENDENT_AMBULATORY_CARE_PROVIDER_SITE_OTHER): Payer: Medicare Other | Admitting: Internal Medicine

## 2012-10-01 DIAGNOSIS — C20 Malignant neoplasm of rectum: Secondary | ICD-10-CM

## 2012-10-25 ENCOUNTER — Other Ambulatory Visit (INDEPENDENT_AMBULATORY_CARE_PROVIDER_SITE_OTHER): Payer: Self-pay | Admitting: Surgery

## 2012-10-31 ENCOUNTER — Ambulatory Visit (INDEPENDENT_AMBULATORY_CARE_PROVIDER_SITE_OTHER): Payer: Medicare Other | Admitting: Surgery

## 2012-10-31 ENCOUNTER — Encounter (INDEPENDENT_AMBULATORY_CARE_PROVIDER_SITE_OTHER): Payer: Self-pay | Admitting: Surgery

## 2012-10-31 VITALS — BP 140/78 | HR 96 | Temp 97.4°F | Resp 16 | Ht 70.0 in | Wt 189.0 lb

## 2012-10-31 DIAGNOSIS — C20 Malignant neoplasm of rectum: Secondary | ICD-10-CM

## 2012-10-31 MED ORDER — NEOMYCIN SULFATE 500 MG PO TABS: 1000.0000 mg | ORAL_TABLET | ORAL | Status: DC

## 2012-10-31 MED ORDER — METRONIDAZOLE 500 MG PO TABS: 500.0000 mg | ORAL_TABLET | ORAL | Status: DC

## 2012-10-31 NOTE — Progress Notes (Signed)
Subjective:     Patient ID: Tyler Diaz, male   DOB: 11-03-1945, 67 y.o.   MRN: [id number]  HPI  Tyler Diaz  09/16/1945 [id number]  Patient Care Team: Ernestina Penna, MD as PCP - General (Family Medicine) Ernestina Penna, MD as Attending Physician (Family Medicine) Ardeth Sportsman, MD as Consulting Physician (Colon and Rectal Surgery) Shirley Friar, MD as Consulting Physician (Gastroenterology) Michae Kava, MD as Consulting Physician (Medical Oncology)  This patient is a 67 y.o.male who presents today for surgical evaluation.   Reason for visit: Followup after neoadjuvant chemoradiation therapy for rectal cancer  Patient returns today doing relatively well.  The razor blade burning feeling has faded away.  He completed chemotherapy and radiation therapy on April 24.  No incontinence to flatus or stool.  Some irregular bowel movements.  Alternating between using MiraLAX and Tylenol 3s.  No fevers or chills.  He comes today with his wife.  He is feeling a little stronger.  No new events.  Can walk 20 minutes straight  Patient Active Problem List   Diagnosis Date Noted  . Colon cancer   . Mitral regurgitation due to partially flail posterior mitral valve leaflet 07/30/2012  . Hyperlipidemia   . Rectal cancer 07/27/2012  . Hypertension   . Vertigo     Past Medical History  Diagnosis Date  . Mitral regurgitation due to partially flail posterior mitral valve leaflet   . Rectal cancer   . Bronchitis   . Hyperlipidemia   . Colon cancer 07/18/12 bx    rectum=Invasive adenocarcinoma w/ extracellular mucin,polyp=benign  . Hypertension     essential (benign)  . Rheumatic fever     hx  . Hearing loss     partial greater on left  . Tinnitus   . Sinusitis     seasonal  . Rectal bleeding     last month hx  . Arthritis     hands/knees  . Hematochezia     recent  . Heart murmur     Past Surgical History  Procedure Laterality Date  . Inguinal hernia repair  1982     left side  . Carpal tunnel release  2002  . Tonsillectomy      as child    History   Social History  . Marital Status: Married    Spouse Name: N/A    Number of Children: 2  . Years of Education: N/A   Occupational History  . Retired     Development worker, [employer]   Social History Main Topics  . Smoking status: Former Smoker -- 1.50 packs/day for 20 years    Types: Cigarettes    Quit date: 07/27/1972  . Smokeless tobacco: Never Used  . Alcohol Use: No  . Drug Use: No     Comment: quit smoking 35 years ago  . Sexually Active: Not on file   Other Topics Concern  . 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  Married    Family History  Problem Relation Age of Onset  . Cancer Mother     abdominal ca ?ovarian?  . Cancer Sister     liver cancer    Current Outpatient Prescriptions  Medication Sig Dispense Refill  . acetaminophen-codeine (TYLENOL #3) 300-30 MG per tablet       . hydrochlorothiazide (HYDRODIURIL) 25 MG tablet Take 25 mg by mouth daily.      Marland Kitchen  hydrocortisone 2.5 % cream       . lisinopril (PRINIVIL,ZESTRIL) 20 MG tablet Take 20 mg by mouth 2 (two) times daily.       . polyethylene glycol powder (GLYCOLAX/MIRALAX) powder Take 17 g by mouth daily. Use 17 g in water and drink every day  527 g  1  . pravastatin (PRAVACHOL) 20 MG tablet Take 20 mg by mouth daily.      . promethazine (PHENERGAN) 25 MG tablet       . traMADol (ULTRAM) 50 MG tablet Take 1 tablet (50 mg total) by mouth every 6 (six) hours as needed for pain.  30 tablet  2   No current facility-administered medications for this visit.     Allergies  Allergen Reactions  . Shellfish Allergy     BP 140/78  Pulse 96  Temp(Src) 97.4 F (36.3 C) (Temporal)  Resp 16  Ht 5\' 10"  (1.778 m)  Wt 189 lb (85.73 kg)  BMI 27.12 kg/m2  No results found. Danae Orleans, MD Fri Aug 24, 2012 9:20:35 AM EDT       **ADDENDUM** CREATED: 08/24/2012 09:10:10   TNM stage by MR is T4a, N2b, Mx. As previously noted, the rectal  carcinoma extends all the way to the anorectal junction. The  enlarged perirectal lymph nodes extend to (directly abut) the  circumferential resection margin, so the circumferential resection  margin is 0.  **END ADDENDUM** SIGNED BY: Jonny Ruiz A. Eppie Gibson, M.D.      Study Result    *RADIOLOGY REPORT*  Clinical Data: Newly diagnosed rectal carcinoma. Staging.  MRI PELVIS WITHOUT AND WITH CONTRAST  Technique: Multiplanar multisequence MR imaging of the pelvis was  performed both before and after administration of intravenous  contrast.  Contrast: 17mL MULTIHANCE GADOBENATE DIMEGLUMINE 529 MG/ML IV SOLN  Comparison: CT on 08/02/2012  Findings: A bulky rectal soft tissue mass is seen which measures  approximately 7 cm in length and extends to the anorectal junction.  This mass measures approximately 4.8 x 6.5 cm in maximum AP  transverse dimensions and abuts the posterior margin of the  prostate gland.  Perirectal lymphadenopathy is seen bilaterally, with largest lymph  nodes measuring 11 mm in short axis and abutting the  circumferential resection margin in the right lateral and  posterolateral perirectal space. No other lymphadenopathy seen  within the pelvis.  Mildly enlarged prostate gland is seen. Seminal vesicles are  symmetric. Moderate diverticulosis of the sigmoid colon is noted,  however there is no evidence of diverticulitis.  IMPRESSION:  1. Bulky carcinoma involving the distal rectum, with length of  approximately 7 cm and extension to the anorectal junction.  2. Bilateral perirectal lymphadenopathy, with largest lymph nodes  measuring 11 mm and abutting the circumferential resection margin  in the right lateral and posterolateral perirectal space.  3. Mildly enlarged prostate. The rectal carcinoma abuts the  posterior aspect the prostate, without evidence of invasion.  4. Moderate sigmoid diverticulosis,  without evidence of  diverticulitis.      Review of Systems  Constitutional: Negative for fever, chills and diaphoresis.  HENT: Negative for sore throat, trouble swallowing and neck pain.   Eyes: Negative for photophobia and visual disturbance.  Respiratory: Negative for choking and shortness of breath.   Cardiovascular: Negative for chest pain and palpitations.  Gastrointestinal: Negative for nausea, vomiting, abdominal distention and rectal pain.  Genitourinary: Negative for dysuria, urgency, difficulty urinating and testicular pain.  Musculoskeletal: Negative for myalgias, arthralgias and gait problem.  Skin: Negative for color change and rash.  Neurological: Negative for dizziness, speech difficulty, weakness and numbness.  Hematological: Negative for adenopathy.  Psychiatric/Behavioral: Negative for hallucinations, confusion and agitation.       Objective:   Physical Exam  Constitutional: He is oriented to person, place, and time. He appears well-developed and well-nourished. No distress.  HENT:  Head: Normocephalic.  Mouth/Throat: Oropharynx is clear and moist. No oropharyngeal exudate.  Eyes: Conjunctivae and EOM are normal. Pupils are equal, round, and reactive to light. No scleral icterus.  Neck: Normal range of motion. No tracheal deviation present.  Cardiovascular: Normal rate, normal heart sounds and intact distal pulses.   Pulmonary/Chest: Effort normal. No respiratory distress.  Abdominal: Soft. He exhibits no distension. There is no tenderness. Hernia confirmed negative in the right inguinal area and confirmed negative in the left inguinal area.  Genitourinary:  Exam done with assistance of male Medical Assistant in the room.  Perianal skin clean with good hygiene.  No pruritis.  No external skin tags / hemorrhoids of significance.  No pilonidal disease.  No fissure.  No abscess/fistula.  Tolerates digital anoscopic rectal exam.  Normal sphincter tone.    Bulky  tumor now with distal extention at posterior midline.  Somewhat mobile.  5 cm from anal verge is distal Hoby Kawai margin  Musculoskeletal: Normal range of motion. He exhibits no tenderness.  Neurological: He is alert and oriented to person, place, and time. No cranial nerve deficit. He exhibits normal muscle tone. Coordination normal.  Skin: Skin is warm and dry. No rash noted. He is not diaphoretic.  Psychiatric: He has a normal mood and affect. His behavior is normal.       Assessment:     Rectal cancer very distal status 4 weeks post neoadjuvant chemotherapy radiation therapy with some shrinkage.  Need for proctectomy.     Plan:     I am hopeful that we have a better chance at sphincter preservation.  However I stressed to him I would not sacrifice cure for that.  He and his wife understand.  They understand the need for an ostomy regardless of low anterior resection vs. APR.  Hopefully just temporary loop ileostomy.  I discussed in detail management of that.  I also gave and ostomy skills kit and Information on resources in the community.  We will get preoperative ileostomy marking.  Plan at 10 weeks = mid July.   I discussed the procedure of laparoscopic resection in detail:  The anatomy & physiology of the digestive tract was discussed.  The pathophysiology was discussed.  Natural history risks without surgery was discussed.   I worked to give an overview of the disease and the frequent need to have multispecialty involvement.  I feel the risks of no intervention will lead to serious problems that outweigh the operative risks; therefore, I recommended a partial proctocolectomy to remove the pathology.  Laparoscopic & open techniques were discussed.  We will work to preserve anal & pelvic floor function without sacrificing cure.  Risks such as bleeding, infection, abscess, leak, reoperation, possible ostomy, hernia, heart attack, death, and other risks were discussed.  I noted a good likelihood  this will help address the problem.   Goals of post-operative recovery were discussed as well.  We will work to minimize complications.  An educational handout on the pathology was given as well.  Questions were answered.    The patient expresses understanding & wishes to proceed with surgery.  He has cardiac  clearance from February.  No new event.  I think it still applies.      r

## 2012-10-31 NOTE — Patient Instructions (Addendum)
See the Handout(s) we gave you.  Consider surgery.  Please call our office at [phone] if you wish to schedule surgery or if you have further questions / concerns.   Colorectal Cancer Colorectal cancer is an abnormal growth of tissue (tumor) in the colon or rectum that is cancerous (malignant). Unlike noncancerous (benign) tumors, malignant tumors can spread to other parts of your body. The colon is the large bowel or large intestine. The rectum is the last several inches of the colon. CAUSES  The exact cause of colon cancer is unknown.  RISK FACTORS The majority of patients do not have identifiable risk factors, but the following factors may increase your chances of getting colon cancer:  Age. Most colorectal cancers occur in people older than 50 years.  Having abnormal growths (polyps) on the inner wall of the colon or rectum.  Diabetes.  Being African American.  Family history of hereditary nonpolyposis colon cancer. This condition is caused by changes in the genes that are responsible for repairing mismatched DNA.  Family history of familial adenomatous polyposis (FAP). This is a rare, inherited condition in which hundreds of polyps form in the colon and rectum. It is caused by a change in the APC gene. Unless FAP is treated, it usually leads to colorectal cancer by age 45.  Personal history of cancer. A person who has already had colorectal cancer may develop it a second time. Also, women with a history of ovarian, uterine, or breast cancer are at a somewhat higher risk of developing colorectal cancer.  Inflammatory bowel disease, including ulcerative colitis and Crohn's disease.  Being obese or eating a diet that is high in fat (especially animal fat) and low in fiber, fruits, and vegetables.  Smoking. A person who smokes cigarettes may be at increased risk of developing polyps and colorectal cancer.  Heavy alcohol use. SYMPTOMS Early colorectal cancer often does not  cause symptoms. As the cancer grows, symptoms may include:  Diarrhea.  Constipation.  Feeling like the bowel does not empty completely after a bowel movement.  Blood in the stool.  Stools that are narrower than usual.  Abdominal discomfort, pain, bloating, fullness, or cramps.  Unexplained weight loss.  Constant tiredness.  Nausea and vomiting. DIAGNOSIS  Your caregiver will ask about your medical history. He or she may also perform a number of procedures, such as:  A physical exam.  Blood tests. This may include a routine complete blood count and iron level testing. Your caregiver may also check for carcinoembryonic antigen (CEA) and other substances in the blood. Some people who have colorectal cancer have a high CEA level. These levels may be used to follow the activity of your colon cancer.  Chest X-rays, computed tomography (CT) scans, or magnetic resonance imaging (MRI).  Taking a tissue sample (biopsy) from the colon or rectum. The sample is examined under a microscope to look for cancer cells.  Sigmoidoscopy. With this test, your caregiver can see inside your colon. A thin flexible tube (sigmoidoscope) is placed into your rectum. This device has a light source and a tiny video camera in it. Your caregiver uses the sigmoidoscope to look at the last third of your colon.  Colonoscopy. This test is like sigmoidoscopy, but your caregiver looks at the entire colon. This test usually requires medicine that helps you relax (sedative).  Endorectal ultrasound. With this test, your caregiver can see how deep a rectal tumor has grown and whether the cancer has spread to lymph nodes or  other nearby tissues. A tool (probe) is inserted into the rectum. The probe sends out sound waves to the rectum and nearby tissues, and a computer uses the echoes to create a picture. Your cancer will be staged to determine its severity and extent. Staging is a careful attempt to find out the size of the  tumor, whether the cancer has spread, and if so, to what parts of the body. You may need to have more tests to determine the stage of your cancer. The test results will help determine what treatment plan is best for you. STAGES  Stage 0. The cancer is found only in the innermost lining of the colon or rectum.  Stage I. The cancer has grown into the inner wall of the colon or rectum. The cancer has not yet reached the outer wall of the colon.  Stage II. The cancer extends more deeply into or through the wall of the colon or rectum. It may have invaded nearby tissue, but cancer cells have not spread to the lymph nodes.  Stage III. The cancer has spread to nearby lymph nodes but not to other parts of the body.  Stage IV. The cancer has spread to other parts of the body, such as the liver or lungs. Your caregiver may tell you the detailed stage of your cancer. In that case, the stage will include both a number and a letter. TREATMENT  Depending on the type and stage, colorectal cancer may be treated with surgery, radiation therapy, or chemotherapy. Some patients have a combination of these therapies.  Surgery may be done to remove the polyps from your colon. In early stages, your caregiver may be able to do this during a colonoscopy. In later stages, surgery may be done to remove part of your colon.  Radiation therapy uses high-energy rays to kill cancer cells. This is usually recommended for patients with rectal cancer.  Chemotherapy is the use of drugs to kill cancer cells. Caregivers also give chemotherapy to help reduce pain and other problems caused by colorectal cancer. This may be done even if the cancer is not curable. HOME CARE INSTRUCTIONS   Only take over-the-counter or prescription medicines for pain, discomfort, or fever as directed by your caregiver.  Maintain a healthy diet.  Consider joining a support group. This may help you learn to cope with the stress of having colorectal  cancer.  Seek advice to help you manage treatment side effects.  Keep all follow-up appointments as directed by your caregiver.  Inform your cancer specialist if you are admitted to the hospital. SEEK IMMEDIATE MEDICAL CARE IF:   Your diarrhea or constipation does not go away.  You have alternating constipation and diarrhea.  You have blood in your stools.  Your abdominal pain gets worse.  You lose weight without trying.  You notice new fatigue or weakness.  You develop a fever during chemotherapy treatment. Document Released: 05/23/2005 Document Revised: 08/15/2011 Document Reviewed: 05/10/2011 [facility] Patient Information 2014 [place], [place].   COLON PREP INSTRUCTIONS for lower/distal colectomy:   Obtain what you need at a pharmacy of your choice:      Prescriptions for your oral antibiotics (Neomycin & Metronidazole)     A bottle of Milk of Magnesia    2 Fleet enemas (generic form OK to use)   DAY PRIOR TO SURGERY:    1:00pm    o Take 2 oz (4 tablespoons) Milk of Magnesia. o Take 2 Neomycin 500mg  tablets & 2 Metronidazole 500mg  tablets  3:00pm:    o Take 2 Neomycin 500mg  tablets & 2 Metronidazole 500mg  tablets     Bedtime (~10:00pm)  o Take 2 Neomycin 500mg  tablets & 2 Metronidazole 500mg  tablets    Midnight:  Do not eat or drink anything after midnight the night before your surgery.   MORNING OF PROCEDURE:    Remember to not to drink or eat anything that morning    Upon waking up, take the 2 Fleet enemas.  o Use at least 1 hour before leaving house o Try to retain each enema for 5-10 minutes before expelling it.  This should clean your lower colon sufficiently   If you have questions or problems, please call [facility] [phone] speak to someone in the clinic department at our office    ABDOMINAL SURGERY: POST OP INSTRUCTIONS  1. DIET: Follow a light bland diet the first 24 hours after arrival home, such as soup, liquids,  crackers, etc.  Be sure to include lots of fluids daily.  Avoid fast food or heavy meals as your are more likely to get nauseated.  Eat a low fat the next few days after surgery.   2. Take your usually prescribed home medications unless otherwise directed. 3. PAIN CONTROL: a. Pain is best controlled by a usual combination of three different methods TOGETHER: i. Ice/Heat ii. Over the counter pain medication iii. Prescription pain medication b. Most patients will experience some swelling and bruising around the incisions.  Ice packs or heating pads (30-60 minutes up to 6 times a day) will help. Use ice for the first few days to help decrease swelling and bruising, then switch to heat to help relax tight/sore spots and speed recovery.  Some people prefer to use ice alone, heat alone, alternating between ice & heat.  Experiment to what works for you.  Swelling and bruising can take several weeks to resolve.   c. It is helpful to take an over-the-counter pain medication regularly for the first few weeks.  Choose one of the following that works best for you: i. Naproxen (Aleve, etc)  Two 220mg  tabs twice a day ii. Ibuprofen (Advil, etc) Three 200mg  tabs four times a day (every meal & bedtime) iii. Acetaminophen (Tylenol, etc) 500-650mg  four times a day (every meal & bedtime) d. A  prescription for pain medication (such as oxycodone, hydrocodone, etc) should be given to you upon discharge.  Take your pain medication as prescribed.  i. If you are having problems/concerns with the prescription medicine (does not control pain, nausea, vomiting, rash, itching, etc), please call us [phone] to see if we need to switch you to a different pain medicine that will work better for you and/or control your side effect better. ii. If you need a refill on your pain medication, please contact your pharmacy.  They will contact our office to request authorization. Prescriptions will not be filled after 5 pm or on  week-ends. 4. Avoid getting constipated.  Between the surgery and the pain medications, it is common to experience some constipation.  Increasing fluid intake and taking a fiber supplement (such as Metamucil, Citrucel, FiberCon, MiraLax, etc) 1-2 times a day regularly will usually help prevent this problem from occurring.  A mild laxative (prune juice, Milk of Magnesia, MiraLax, etc) should be taken according to package directions if there are no bowel movements after 48 hours.   5. Watch out for diarrhea.  If you have many loose bowel movements, simplify your diet to bland foods &  liquids for a few days.  Stop any stool softeners and decrease your fiber supplement.  Switching to mild anti-diarrheal medications (Kayopectate, Pepto Bismol) can help.  If this worsens or does not improve, please call us. 6. Wash / shower every day.  You may shower over the incision / wound.  Avoid baths until the skin is fully healed.  Continue to shower over incision(s) after the dressing is off. 7. Remove your waterproof bandages 5 days after surgery.  You may leave the incision open to air.  You may replace a dressing/Band-Aid to cover the incision for comfort if you wish. 8. ACTIVITIES as tolerated:   a. You may resume regular (light) daily activities beginning the next day-such as daily self-care, walking, climbing stairs-gradually increasing activities as tolerated.  If you can walk 30 minutes without difficulty, it is safe to try more intense activity such as jogging, treadmill, bicycling, low-impact aerobics, swimming, etc. b. Save the most intensive and strenuous activity for last such as sit-ups, heavy lifting, contact sports, etc  Refrain from any heavy lifting or straining until you are off narcotics for pain control.   c. DO NOT PUSH THROUGH PAIN.  Let pain be your guide: If it hurts to do something, don't do it.  Pain is your body warning you to avoid that activity for another week until the pain goes  down. d. You may drive when you are no longer taking prescription pain medication, you can comfortably wear a seatbelt, and you can safely maneuver your car and apply brakes. e. Bonita Quin may have sexual intercourse when it is comfortable.  9. FOLLOW UP in our office a. Please call [facility] at [phone] to set up an appointment to see your surgeon in the office for a follow-up appointment approximately 1-2 weeks after your surgery. b. Make sure that you call for this appointment the day you arrive home to insure a convenient appointment time. 10. IF YOU HAVE DISABILITY OR FAMILY LEAVE FORMS, BRING THEM TO THE OFFICE FOR PROCESSING.  DO NOT GIVE THEM TO YOUR DOCTOR.   WHEN TO CALL us [phone]: 1. Poor pain control 2. Reactions / problems with new medications (rash/itching, nausea, etc)  3. Fever over 101.5 F (38.5 C) 4. Inability to urinate 5. Nausea and/or vomiting 6. Worsening swelling or bruising 7. Continued bleeding from incision. 8. Increased pain, redness, or drainage from the incision  The clinic staff is available to answer your questions during regular business hours (8:30am-5pm).  Please don't hesitate to call and ask to speak to one of our nurses for clinical concerns.   A surgeon from [facility] is always on call at the hospitals   If you have a medical emergency, go to the nearest emergency room or call 911.    [facility] [address], [address], [place], [place]  [address] ? MAIN: [phone] ? TOLL FREE: [phone] ? FAX [phone] [email or web address]   Ostomy Support Information  Yes, you've heard that people get along just fine with only one of their eyes, or one of their lungs, or one of their kidneys. But you also know that you have only one intestine and only one bladder, and that leaves you feeling awfully empty, both physically and emotionally: You think no other people go around without part of their  intestine with the ends of their intestines sticking out through their abdominal walls.  Well, you are wrong! There are nearly three quarters of a  million people in the [place] who have an ostomy; people who have had surgery to remove all or part of their colons or bladders. There is even a national association, the [place] [employer] of [place] with over 350 local affiliated support groups that are organized by volunteers who provide peer support and counseling. Cheral Marker has a toll free telephone num-ber, (517)245-0575 and an educational,  interactive website, www.ostomy.org   An ostomy is an opening in the belly (abdominal wall) made by surgery. Ostomates are people who have had this procedure. The opening (stoma) allows the kidney or bowel to discharge waste. An external pouch covers the stoma to collect waste. Pouches are are a simple bag and are odor free. Different companies have disposable or reusable pouches to fit one's lifestyle. An ostomy can either be temporary or permanent.  THERE ARE THREE MAIN TYPES OF OSTOMIES  Colostomy. A colostomy is a surgically created opening in the large intestine (colon).  Ileostomy. An ileostomy is a surgically created opening in the small intestine.  Urostomy. A urostomy is a surgically created opening to divert urine away from the bladder. FREQUENTLY ASKED QUESTIONS   Why haven't you met any of these folks who have an ostomy?  Well, maybe you have! You just did not recognize them because an ostomy doesn't show. It can be kept secret if you wish. Why, maybe some of your best friends, office associates or neighbors have an ostomy ... you never can tell.   People facing ostomy surgery have many quality-of-life questions like:  Will you bulge? Smell? Make noises? Will you feel waste leaving your body? Will you be a captive of the toilet? Will you starve? Be a social outcast? Get/stay married? Have babies? Easily bathe, go swimming, bend over?  OK, let's look  at what you can expect:  Will you bulge?  Remember, without part of the intestine or bladder, and its contents, you should have a flatter tummy than before. You can expect to wear, with little exception, what you wore before surgery ... and this in-cludes tight clothing and bathing suits.  Will you smell?  Today, thanks to modern odor proof pouching systems, you can walk into an ostomy support group meeting and not smell anything that is foul or offensive. And, for those with an ileostomy or colostomy who are concerned about odor when emptying their pouch, there are in-pouch deodorants that can be used to eliminate any waste odors that may exist.  Will you make noises?  Everyone produces gas, especially if they are an air-swallower. But intestinal sounds that occur from time to time are no differ-ent than a gurgling tummy, and quite often your clothing will muffle any sounds.   Will you feel the waste discharges?  For those with a colostomy or ileostomy there might be a slight pressure when waste leaves your body, but understand that the intestines have no nerve endings, so there will be no unpleasant sensations. Those with a urostomy will probably be unaware of any kidney drainage.  Will you be a captive of the toilet?  Immediately post-op you will spend more time in the bathroom than you will after your body recovers from surgery. Every person is different, but on average those with an ileostomy or urostomy may empty their pouches 4 to 6 times a day; a little  less if you have a colostomy. The average wear time between pouch system changes is 3 to 5 days and the changing process should take less than 30  minutes.  Will I need to be on a special diet? Most people return to their normal diet when they have recovered from surgery. Be sure to chew your food well, eat a well-balanced diet and drink plenty of fluids. If you experience problems with a certain food, wait a couple of weeks and try it  again. Will there be odor and noises? Pouching systems are designed to be odor-proof or odor-resistant. There are deodorants that can be used in the pouch. Medications are also available to help reduce odor. Limit gas-producing foods and carbonated beverages. You will experience less gas and fewer noises as you heal from surgery. How much time will it take to care for my ostomy? At first, you may spend a lot of time learning about your ostomy and how to take care of it. As you become more comfortable and skilled at changing the pouching system, it will take very little time to care for it.  Will I be able to return to work? People with ostomies can perform most jobs. As soon as you have healed from surgery, you should be able to return to work. Heavy lifting (more than 10 pounds) may be discouraged.  What about intimacy? Sexual relationships and intimacy are important and fulfilling aspects of your life. They should continue after ostomy surgery. Intimacy-related concerns should be discussed openly between you and your partner.  Can I wear regular clothing? You do not need to wear special clothing. Ostomy pouches are fairly flat and barely noticeable. Elastic undergarments will not hurt the stoma or prevent the ostomy from functioning.  Can I participate in sports? An ostomy should not limit your involvement in sports. Many people with ostomies are runners, skiers, swimmers or participate in other active lifestyles. Talk with your caregiver first before doing heavy physical activity.  Will you starve?  Not if you follow doctor's orders at each stage of your post-op adjustment. There is no such thing as an "ostomy diet". Some people with an ostomy will be able to eat and tolerate anything; others may find diffi-culty with some foods. Each person is an individual and must determine, by trial, what is best for them. A good practice for all is to drink plenty of water.  Will you be a social outcast?   Have you met anyone who has an ostomy and is a social outcast? Why should you be the first? Only your attitude and self image will effect how you are treated. No confi-dent person is an Investment banker, corporate.   PROFESSIONAL HELP  Resources are available if you need help or have questions about your ostomy.    Specially trained nurses called Wound, Ostomy Continence Nurses (WOCN) are available for consultation in most major medical centers.   Consider getting an ostomy consult with Page Spiro at [facility] to help troubleshoot collagen fittings and other issues with your ostomy: [phone]   The [employer] Ostomy Association (UOA) is a group made up of many local chapters throughout the [place]. These local groups hold meetings and provide support to prospective and existing ostomates. They sponsor educational events and have qualified visitors to make personal or telephone visits. Contact the UOA for the chapter nearest you and for other educational publications.  More detailed information can be found in Colostomy Guide, a publication of the [employer] (UOA). Contact UOA at 1-(708)725-1861 or visit their web site at YellowSpecialist.at. The website contains links to other sites, suppliers and resources. Document Released: 05/26/2003 Document Revised: 08/15/2011  Document Reviewed: 09/24/2008 [facility] Patient Information 2013 [place], [place].

## 2012-11-01 NOTE — Telephone Encounter (Signed)
We called this rx refill in for the pt to [facility] pharmacy for 3 refills per Dr Michaell Cowing.

## 2012-11-27 ENCOUNTER — Telehealth (INDEPENDENT_AMBULATORY_CARE_PROVIDER_SITE_OTHER): Payer: Self-pay

## 2012-11-27 NOTE — Telephone Encounter (Signed)
Pre op medications called to [facility] per pt request.  E-prescribe not on record.

## 2012-12-17 ENCOUNTER — Encounter (HOSPITAL_COMMUNITY): Payer: Self-pay | Admitting: Pharmacy Technician

## 2012-12-20 ENCOUNTER — Other Ambulatory Visit (HOSPITAL_COMMUNITY): Payer: Self-pay | Admitting: Surgery

## 2012-12-20 NOTE — Patient Instructions (Addendum)
20 Tyler Diaz  12/20/2012   Your procedure is scheduled on: Thursday 12-27-2012  Report to [facility] at 530 AM.  Call this number if you have problems the morning of surgery [phone]   Remember:   Do not eat food or drink liquids :After Midnight.     Take these medicines the morning of surgery with A SIP OF WATER: tyneol # 3 if needed                                SEE [facility] PREPARING FOR SURGERY SHEET   Do not wear jewelry, make-up or nail polish.  Do not wear lotions, powders, or perfumes. You may wear deodorant.   Men may shave face and neck.  Do not bring valuables to the hospital. [facility] IS NOT RESPONSIBLE FOR VALUEABLES.  Contacts, dentures or bridgework may not be worn into surgery.  Leave suitcase in the car. After surgery it may be brought to your room.  For patients admitted to the hospital, checkout time is 11:00 AM the day of discharge.   Patients discharged the day of surgery will not be allowed to drive home.  Name and phone number of your driver:  Special Instructions: N/A   Please read over the following fact sheets that you were given: MRSA Information, blood fact sheet  Call Cain Sieve RN pre op nurse if needed [phone]    FAILURE TO FOLLOW THESE INSTRUCTIONS MAY RESULT IN THE CANCELLATION OF YOUR SURGERY.  PATIENT SIGNATURE___________________________________________  NURSE SIGNATURE_____________________________________________

## 2012-12-21 ENCOUNTER — Encounter: Payer: Self-pay | Admitting: *Deleted

## 2012-12-21 ENCOUNTER — Encounter (HOSPITAL_COMMUNITY)
Admission: RE | Admit: 2012-12-21 | Discharge: 2012-12-21 | Disposition: A | Discharge status: Home / Self Care | Payer: Medicare Other | Source: Ambulatory Visit | Attending: Surgery | Admitting: Surgery

## 2012-12-21 ENCOUNTER — Encounter (HOSPITAL_COMMUNITY): Payer: Self-pay

## 2012-12-21 DIAGNOSIS — C20 Malignant neoplasm of rectum: Secondary | ICD-10-CM | POA: Insufficient documentation

## 2012-12-21 DIAGNOSIS — Z01812 Encounter for preprocedural laboratory examination: Secondary | ICD-10-CM | POA: Insufficient documentation

## 2012-12-21 HISTORY — DX: Hypothyroidism, unspecified: E03.9

## 2012-12-21 LAB — BASIC METABOLIC PANEL WITH GFR
BUN: 22 mg/dL (ref 6–23)
CO2: 28 meq/L (ref 19–32)
Calcium: 9.6 mg/dL (ref 8.4–10.5)
Chloride: 102 meq/L (ref 96–112)
Creatinine, Ser: 0.96 mg/dL (ref 0.50–1.35)
GFR calc Af Amer: >90 mL/min
GFR calc non Af Amer: 83 mL/min — ABNORMAL LOW
Glucose, Bld: 98 mg/dL (ref 70–99)
Potassium: 4.2 meq/L (ref 3.5–5.1)
Sodium: 139 meq/L (ref 135–145)

## 2012-12-21 LAB — CBC
HCT: 37.5 % — ABNORMAL LOW (ref 39.0–52.0)
Hemoglobin: 12.6 g/dL — ABNORMAL LOW (ref 13.0–17.0)
MCH: 33.4 pg (ref 26.0–34.0)
MCHC: 33.6 g/dL (ref 30.0–36.0)
MCV: 99.5 fL (ref 78.0–100.0)
Platelets: 220 K/uL (ref 150–400)
RBC: 3.77 MIL/uL — ABNORMAL LOW (ref 4.22–5.81)
RDW: 13.8 % (ref 11.5–15.5)
WBC: 6.4 K/uL (ref 4.0–10.5)

## 2012-12-21 NOTE — Progress Notes (Signed)
12/21/12 1109  OBSTRUCTIVE SLEEP APNEA  Have you ever been diagnosed with sleep apnea through a sleep study? No  Do you snore loudly (loud enough to be heard through closed doors)?  1  Do you often feel tired, fatigued, or sleepy during the daytime? 1  Has anyone observed you stop breathing during your sleep? 1  Do you have, or are you being treated for high blood pressure? 1  BMI more than 35 kg/m2? 0  Age over 69 years old? 1  Gender: 1  Obstructive Sleep Apnea Score 6  Score 4 or greater  Results sent to PCP

## 2012-12-21 NOTE — Consult Note (Signed)
WOC consult for preoperative stoma site marking. Thorough discussing with pt, wife and his son. He has been told by his surgeon he may have an ileostomy or a colostomy and this may be temporary or permanent based on the surgeons findings once in the surgery.   I have explained briefly the difference and that we would go over in more detail after the surgery once we know what he has.  I have shown the patient the different pouching opinions.   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  He has a wife at the beside and a son who seem very supportive. Answered some question on the coverage of ostomy supplies by his insurance.   Viewed abdomen with the patient lying, sitting, and standing. He wears his pants very low, therefore I was forced to go above his belt line for the stoma sites. Marked two sites one on the right and one on the left within his visual field, even though he does have to lift up on his belly a bit when sitting to see, I think this will allow him to still place the lower portion of the pouch into his pants.  Marks placed within his rectus muscle and avoided and abdominal contour issues.   Covered sites with thin film to keep intact until surgery July 24. I have explained the role of the WOC nurse and that my partner would probably be the one to see him since she is traditionally on the [facility] campus.  Marked for colostomy 3cm below and 4 cm to the left of the umbilicus Marked for ilesotomy 2cm below and 6cm to the right of the umblicus.  Provided pt with marking pen, educational DVD and materials, as well as some examples of pouches.  Saidah Kempton [place] RN,CWOCN [phone]

## 2012-12-26 MED ORDER — BUPIVACAINE 0.25 % ON-Q PUMP DUAL CATH 300 ML
300.0000 mL | INJECTION | Status: DC
  Filled 2012-12-26: qty 300

## 2012-12-26 MED ORDER — DEXTROSE 5 % IV SOLN
2.0000 g | INTRAVENOUS | Status: AC
  Administered 2012-12-27: 2 g via INTRAVENOUS
  Filled 2012-12-26 (×2): qty 2

## 2012-12-26 MED ORDER — GENTAMICIN SULFATE 40 MG/ML IJ SOLN
INTRAMUSCULAR | Status: DC
  Filled 2012-12-26: qty 6

## 2012-12-27 ENCOUNTER — Inpatient Hospital Stay (HOSPITAL_COMMUNITY): Payer: Medicare Other | Admitting: Anesthesiology

## 2012-12-27 ENCOUNTER — Encounter (HOSPITAL_COMMUNITY): Payer: Self-pay | Admitting: Anesthesiology

## 2012-12-27 ENCOUNTER — Encounter (HOSPITAL_COMMUNITY)
Admission: RE | Disposition: A | Discharge status: Home Health | Payer: Self-pay | Source: Ambulatory Visit | Attending: Surgery

## 2012-12-27 ENCOUNTER — Inpatient Hospital Stay (HOSPITAL_COMMUNITY)
Admission: RE | Admit: 2012-12-27 | Discharge: 2013-01-03 | DRG: 330 | Disposition: A | Discharge status: Home Health | Payer: Medicare Other | Source: Ambulatory Visit | Attending: Surgery | Admitting: Surgery

## 2012-12-27 ENCOUNTER — Encounter (HOSPITAL_COMMUNITY): Payer: Self-pay | Admitting: *Deleted

## 2012-12-27 DIAGNOSIS — C20 Malignant neoplasm of rectum: Secondary | ICD-10-CM | POA: Insufficient documentation

## 2012-12-27 DIAGNOSIS — K420 Umbilical hernia with obstruction, without gangrene: Secondary | ICD-10-CM

## 2012-12-27 DIAGNOSIS — M129 Arthropathy, unspecified: Secondary | ICD-10-CM | POA: Diagnosis present

## 2012-12-27 DIAGNOSIS — I059 Rheumatic mitral valve disease, unspecified: Secondary | ICD-10-CM | POA: Diagnosis present

## 2012-12-27 DIAGNOSIS — K4090 Unilateral inguinal hernia, without obstruction or gangrene, not specified as recurrent: Secondary | ICD-10-CM | POA: Diagnosis present

## 2012-12-27 DIAGNOSIS — K4030 Unilateral inguinal hernia, with obstruction, without gangrene, not specified as recurrent: Secondary | ICD-10-CM | POA: Diagnosis present

## 2012-12-27 DIAGNOSIS — I10 Essential (primary) hypertension: Secondary | ICD-10-CM | POA: Diagnosis present

## 2012-12-27 DIAGNOSIS — K560 Paralytic ileus: Secondary | ICD-10-CM | POA: Diagnosis not present

## 2012-12-27 DIAGNOSIS — C772 Secondary and unspecified malignant neoplasm of intra-abdominal lymph nodes: Secondary | ICD-10-CM | POA: Diagnosis present

## 2012-12-27 DIAGNOSIS — N5089 Other specified disorders of the male genital organs: Secondary | ICD-10-CM | POA: Diagnosis not present

## 2012-12-27 DIAGNOSIS — R12 Heartburn: Secondary | ICD-10-CM | POA: Diagnosis not present

## 2012-12-27 DIAGNOSIS — R42 Dizziness and giddiness: Secondary | ICD-10-CM | POA: Diagnosis not present

## 2012-12-27 DIAGNOSIS — Z87891 Personal history of nicotine dependence: Secondary | ICD-10-CM

## 2012-12-27 DIAGNOSIS — Z01812 Encounter for preprocedural laboratory examination: Secondary | ICD-10-CM

## 2012-12-27 DIAGNOSIS — C189 Malignant neoplasm of colon, unspecified: Secondary | ICD-10-CM

## 2012-12-27 DIAGNOSIS — N4889 Other specified disorders of penis: Secondary | ICD-10-CM | POA: Diagnosis not present

## 2012-12-27 DIAGNOSIS — E785 Hyperlipidemia, unspecified: Secondary | ICD-10-CM | POA: Diagnosis present

## 2012-12-27 DIAGNOSIS — R011 Cardiac murmur, unspecified: Secondary | ICD-10-CM | POA: Diagnosis present

## 2012-12-27 DIAGNOSIS — K4020 Bilateral inguinal hernia, without obstruction or gangrene, not specified as recurrent: Secondary | ICD-10-CM

## 2012-12-27 HISTORY — PX: UMBILICAL HERNIA REPAIR: SHX196

## 2012-12-27 HISTORY — PX: ILEOSTOMY: SHX1783

## 2012-12-27 HISTORY — PX: LAPAROSCOPIC LOW ANTERIOR RESCECTION WITH COLOANAL ANASTOMOSIS: SHX5903

## 2012-12-27 HISTORY — PX: LAPAROSCOPIC LOW ANTERIOR RESECTION: SHX5904

## 2012-12-27 LAB — TYPE AND SCREEN: ABO/RH(D): A POS

## 2012-12-27 SURGERY — RESECTION, RECTUM, LOW ANTERIOR, LAPAROSCOPIC
Anesthesia: General | Site: Abdomen | Wound class: Contaminated

## 2012-12-27 MED ORDER — DIPHENHYDRAMINE HCL 12.5 MG/5ML PO ELIX: 12.5000 mg | ORAL_SOLUTION | Freq: Four times a day (QID) | ORAL | Status: DC | PRN

## 2012-12-27 MED ORDER — KCL IN DEXTROSE-NACL 40-5-0.9 MEQ/L-%-% IV SOLN
INTRAVENOUS | Status: DC
  Administered 2012-12-27: 19:00:00 via INTRAVENOUS
  Administered 2012-12-28: 50 mL via INTRAVENOUS
  Administered 2012-12-29: 15:00:00 via INTRAVENOUS
  Filled 2012-12-27 (×7): qty 1000

## 2012-12-27 MED ORDER — SODIUM CHLORIDE 0.9 % IV SOLN
INTRAVENOUS | Status: DC | PRN
  Administered 2012-12-27: 10:00:00 via INTRAPERITONEAL

## 2012-12-27 MED ORDER — MIDAZOLAM HCL 5 MG/5ML IJ SOLN
INTRAMUSCULAR | Status: DC | PRN
  Administered 2012-12-27: 2 mg via INTRAVENOUS

## 2012-12-27 MED ORDER — LIDOCAINE HCL (PF) 2 % IJ SOLN
INTRAMUSCULAR | Status: DC | PRN
  Administered 2012-12-27: 80 mg

## 2012-12-27 MED ORDER — GLYCOPYRROLATE 0.2 MG/ML IJ SOLN
INTRAMUSCULAR | Status: DC | PRN
  Administered 2012-12-27: .6 mg via INTRAVENOUS

## 2012-12-27 MED ORDER — BUPIVACAINE 0.25 % ON-Q PUMP DUAL CATH 300 ML
INJECTION | Status: DC | PRN
  Administered 2012-12-27: 300 mL

## 2012-12-27 MED ORDER — ALVIMOPAN 12 MG PO CAPS
12.0000 mg | ORAL_CAPSULE | Freq: Once | ORAL | Status: AC
  Administered 2012-12-27: 12 mg via ORAL
  Filled 2012-12-27: qty 1

## 2012-12-27 MED ORDER — DEXTROSE 5 % IV SOLN
2.0000 g | Freq: Two times a day (BID) | INTRAVENOUS | Status: AC
  Administered 2012-12-27: 2 g via INTRAVENOUS
  Filled 2012-12-27: qty 2

## 2012-12-27 MED ORDER — BUPIVACAINE LIPOSOME 1.3 % IJ SUSP
INTRAMUSCULAR | Status: DC | PRN
  Administered 2012-12-27: 20 mL

## 2012-12-27 MED ORDER — LACTATED RINGERS IV BOLUS (SEPSIS): 1000.0000 mL | Freq: Three times a day (TID) | INTRAVENOUS | Status: AC | PRN

## 2012-12-27 MED ORDER — LISINOPRIL 10 MG PO TABS
10.0000 mg | ORAL_TABLET | Freq: Every day | ORAL | Status: DC
  Filled 2012-12-27 (×2): qty 1

## 2012-12-27 MED ORDER — FENTANYL CITRATE 0.05 MG/ML IJ SOLN
INTRAMUSCULAR | Status: DC | PRN
  Administered 2012-12-27 (×2): 50 ug via INTRAVENOUS

## 2012-12-27 MED ORDER — SACCHAROMYCES BOULARDII 250 MG PO CAPS
250.0000 mg | ORAL_CAPSULE | Freq: Two times a day (BID) | ORAL | Status: DC
  Administered 2012-12-27 – 2013-01-03 (×14): 250 mg via ORAL
  Filled 2012-12-27 (×15): qty 1

## 2012-12-27 MED ORDER — HEPARIN SODIUM (PORCINE) 5000 UNIT/ML IJ SOLN
5000.0000 [IU] | Freq: Once | INTRAMUSCULAR | Status: AC
  Administered 2012-12-27: 5000 [IU] via SUBCUTANEOUS
  Filled 2012-12-27: qty 1

## 2012-12-27 MED ORDER — METOPROLOL TARTRATE 1 MG/ML IV SOLN
5.0000 mg | Freq: Four times a day (QID) | INTRAVENOUS | Status: DC | PRN
  Filled 2012-12-27: qty 5

## 2012-12-27 MED ORDER — DIPHENHYDRAMINE HCL 50 MG/ML IJ SOLN
12.5000 mg | Freq: Four times a day (QID) | INTRAMUSCULAR | Status: DC | PRN
  Administered 2012-12-31: 12.5 mg via INTRAVENOUS
  Filled 2012-12-27: qty 1

## 2012-12-27 MED ORDER — CISATRACURIUM BESYLATE (PF) 10 MG/5ML IV SOLN
INTRAVENOUS | Status: DC | PRN
  Administered 2012-12-27 (×2): 2 mg via INTRAVENOUS
  Administered 2012-12-27: 4 mg via INTRAVENOUS
  Administered 2012-12-27: 10 mg via INTRAVENOUS
  Administered 2012-12-27 (×3): 4 mg via INTRAVENOUS
  Administered 2012-12-27: 6 mg via INTRAVENOUS
  Administered 2012-12-27: 4 mg via INTRAVENOUS

## 2012-12-27 MED ORDER — ACETAMINOPHEN 500 MG PO TABS
1000.0000 mg | ORAL_TABLET | Freq: Three times a day (TID) | ORAL | Status: DC
  Administered 2012-12-27 – 2013-01-03 (×21): 1000 mg via ORAL
  Filled 2012-12-27 (×26): qty 2

## 2012-12-27 MED ORDER — LACTATED RINGERS IV SOLN: INTRAVENOUS | Status: DC

## 2012-12-27 MED ORDER — BUPIVACAINE-EPINEPHRINE 0.25% -1:200000 IJ SOLN
INTRAMUSCULAR | Status: DC | PRN
  Administered 2012-12-27: 15 mL

## 2012-12-27 MED ORDER — HEPARIN SODIUM (PORCINE) 5000 UNIT/ML IJ SOLN
5000.0000 [IU] | Freq: Three times a day (TID) | INTRAMUSCULAR | Status: DC
  Administered 2012-12-28 – 2013-01-03 (×19): 5000 [IU] via SUBCUTANEOUS
  Filled 2012-12-27 (×22): qty 1

## 2012-12-27 MED ORDER — RINGERS IRRIGATION IR SOLN
Status: DC | PRN
  Administered 2012-12-27: 3000 mL

## 2012-12-27 MED ORDER — BUPIVACAINE ON-Q PAIN PUMP (FOR ORDER SET NO CHG)
INJECTION | Status: DC
  Filled 2012-12-27: qty 1

## 2012-12-27 MED ORDER — ONDANSETRON HCL 4 MG/2ML IJ SOLN
INTRAMUSCULAR | Status: DC | PRN
  Administered 2012-12-27: 4 mg via INTRAVENOUS

## 2012-12-27 MED ORDER — 0.9 % SODIUM CHLORIDE (POUR BTL) OPTIME
TOPICAL | Status: DC | PRN
  Administered 2012-12-27: 2000 mL

## 2012-12-27 MED ORDER — BIOTENE DRY MOUTH MT LIQD
15.0000 mL | Freq: Two times a day (BID) | OROMUCOSAL | Status: DC
  Administered 2012-12-27 – 2013-01-03 (×12): 15 mL via OROMUCOSAL

## 2012-12-27 MED ORDER — ZOLPIDEM TARTRATE 5 MG PO TABS: 5.0000 mg | ORAL_TABLET | Freq: Every evening | ORAL | Status: DC | PRN

## 2012-12-27 MED ORDER — PROMETHAZINE HCL 25 MG/ML IJ SOLN: 6.2500 mg | INTRAMUSCULAR | Status: DC | PRN

## 2012-12-27 MED ORDER — ALUM & MAG HYDROXIDE-SIMETH 200-200-20 MG/5ML PO SUSP
30.0000 mL | Freq: Four times a day (QID) | ORAL | Status: DC | PRN
  Administered 2012-12-29: 30 mL via ORAL
  Filled 2012-12-27: qty 30

## 2012-12-27 MED ORDER — HYDROMORPHONE HCL PF 1 MG/ML IJ SOLN
INTRAMUSCULAR | Status: DC | PRN
  Administered 2012-12-27 (×2): 1 mg via INTRAVENOUS

## 2012-12-27 MED ORDER — PNEUMOCOCCAL VAC POLYVALENT 25 MCG/0.5ML IJ INJ
0.5000 mL | INJECTION | INTRAMUSCULAR | Status: DC | PRN
  Filled 2012-12-27 (×2): qty 0.5

## 2012-12-27 MED ORDER — ALVIMOPAN 12 MG PO CAPS
12.0000 mg | ORAL_CAPSULE | Freq: Two times a day (BID) | ORAL | Status: DC
  Administered 2012-12-28 (×2): 12 mg via ORAL
  Filled 2012-12-27 (×4): qty 1

## 2012-12-27 MED ORDER — LIP MEDEX EX OINT
1.0000 "application " | TOPICAL_OINTMENT | Freq: Two times a day (BID) | CUTANEOUS | Status: DC
  Administered 2012-12-27 – 2013-01-03 (×13): 1 via TOPICAL
  Filled 2012-12-27 (×2): qty 7

## 2012-12-27 MED ORDER — KETOROLAC TROMETHAMINE 30 MG/ML IJ SOLN
INTRAMUSCULAR | Status: DC | PRN
  Administered 2012-12-27: 30 mg via INTRAVENOUS

## 2012-12-27 MED ORDER — PROPOFOL 10 MG/ML IV BOLUS
INTRAVENOUS | Status: DC | PRN
  Administered 2012-12-27: 150 mg via INTRAVENOUS

## 2012-12-27 MED ORDER — SUFENTANIL CITRATE 50 MCG/ML IV SOLN
INTRAVENOUS | Status: DC | PRN
  Administered 2012-12-27 (×3): 5 ug via INTRAVENOUS
  Administered 2012-12-27: 10 ug via INTRAVENOUS
  Administered 2012-12-27 (×8): 5 ug via INTRAVENOUS
  Administered 2012-12-27 (×2): 10 ug via INTRAVENOUS
  Administered 2012-12-27: 5 ug via INTRAVENOUS
  Administered 2012-12-27: 10 ug via INTRAVENOUS

## 2012-12-27 MED ORDER — ADULT MULTIVITAMIN W/MINERALS CH
1.0000 | ORAL_TABLET | Freq: Every day | ORAL | Status: DC
  Administered 2012-12-28 – 2013-01-03 (×7): 1 via ORAL
  Filled 2012-12-27 (×8): qty 1

## 2012-12-27 MED ORDER — MAGIC MOUTHWASH
15.0000 mL | Freq: Four times a day (QID) | ORAL | Status: DC | PRN
  Filled 2012-12-27: qty 15

## 2012-12-27 MED ORDER — HYDROMORPHONE HCL PF 1 MG/ML IJ SOLN
0.2500 mg | INTRAMUSCULAR | Status: DC | PRN
  Administered 2012-12-27 (×2): 0.5 mg via INTRAVENOUS

## 2012-12-27 MED ORDER — BUPIVACAINE LIPOSOME 1.3 % IJ SUSP
20.0000 mL | Freq: Once | INTRAMUSCULAR | Status: DC
  Filled 2012-12-27: qty 20

## 2012-12-27 MED ORDER — TRAMADOL HCL 50 MG PO TABS: 50.0000 mg | ORAL_TABLET | Freq: Four times a day (QID) | ORAL | Status: DC | PRN

## 2012-12-27 MED ORDER — PROMETHAZINE HCL 25 MG/ML IJ SOLN
6.2500 mg | Freq: Four times a day (QID) | INTRAMUSCULAR | Status: DC | PRN
  Administered 2012-12-30 – 2012-12-31 (×2): 6.25 mg via INTRAVENOUS
  Filled 2012-12-27 (×2): qty 1

## 2012-12-27 MED ORDER — LACTATED RINGERS IV SOLN
INTRAVENOUS | Status: DC | PRN
  Administered 2012-12-27: 07:00:00 via INTRAVENOUS

## 2012-12-27 MED ORDER — HYDROMORPHONE HCL PF 1 MG/ML IJ SOLN
0.5000 mg | INTRAMUSCULAR | Status: DC | PRN
  Administered 2012-12-27 – 2012-12-28 (×3): 1 mg via INTRAVENOUS
  Administered 2012-12-28 (×2): 2 mg via INTRAVENOUS
  Administered 2012-12-28: 1 mg via INTRAVENOUS
  Administered 2012-12-28: 2 mg via INTRAVENOUS
  Administered 2012-12-29: 1 mg via INTRAVENOUS
  Administered 2012-12-29 (×4): 2 mg via INTRAVENOUS
  Administered 2012-12-29 – 2012-12-30 (×3): 1 mg via INTRAVENOUS
  Administered 2012-12-30: 2 mg via INTRAVENOUS
  Administered 2012-12-30: 1 mg via INTRAVENOUS
  Administered 2012-12-30 – 2012-12-31 (×4): 2 mg via INTRAVENOUS
  Filled 2012-12-27: qty 1
  Filled 2012-12-27: qty 2
  Filled 2012-12-27 (×3): qty 1
  Filled 2012-12-27: qty 2
  Filled 2012-12-27: qty 1
  Filled 2012-12-27 (×2): qty 2
  Filled 2012-12-27: qty 1
  Filled 2012-12-27: qty 2
  Filled 2012-12-27: qty 1
  Filled 2012-12-27: qty 2
  Filled 2012-12-27: qty 1
  Filled 2012-12-27: qty 2
  Filled 2012-12-27: qty 1
  Filled 2012-12-27 (×2): qty 2
  Filled 2012-12-27: qty 1
  Filled 2012-12-27 (×2): qty 2
  Filled 2012-12-27: qty 1

## 2012-12-27 MED ORDER — NEOSTIGMINE METHYLSULFATE 1 MG/ML IJ SOLN
INTRAMUSCULAR | Status: DC | PRN
  Administered 2012-12-27: 4 mg via INTRAVENOUS

## 2012-12-27 MED ORDER — LACTATED RINGERS IV SOLN
INTRAVENOUS | Status: DC | PRN
  Administered 2012-12-27 (×2): via INTRAVENOUS

## 2012-12-27 SURGICAL SUPPLY — 107 items
APPLIER CLIP ROT 10 11.4 M/L (STAPLE)
APR CLP MED LRG 11.4X10 (STAPLE)
BLADE HEX COATED 2.75 (ELECTRODE) ×6 IMPLANT
BLADE SURG 15 STRL LF DISP TIS (BLADE) ×6 IMPLANT
BLADE SURG 15 STRL SS (BLADE) ×8
BLADE SURG ROTATE 9660 (MISCELLANEOUS) ×2 IMPLANT
CANISTER SUCTION 2500CC (MISCELLANEOUS) ×4 IMPLANT
CATH KIT ON Q 7.5IN SLV (PAIN MANAGEMENT) ×4 IMPLANT
CELLS DAT CNTRL 66122 CELL SVR (MISCELLANEOUS) IMPLANT
CHLORAPREP W/TINT 26ML (MISCELLANEOUS) ×4 IMPLANT
CLIP APPLIE ROT 10 11.4 M/L (STAPLE) IMPLANT
CLOTH BEACON ORANGE TIMEOUT ST (SAFETY) ×4 IMPLANT
COUNTER NEEDLE 20 DBL MAG RED (NEEDLE) ×2 IMPLANT
COVER SURGICAL LIGHT HANDLE (MISCELLANEOUS) ×2 IMPLANT
DECANTER SPIKE VIAL GLASS SM (MISCELLANEOUS) ×6 IMPLANT
DISSECTOR BLUNT TIP ENDO 5MM (MISCELLANEOUS) IMPLANT
DRAIN CHANNEL RND F F (WOUND CARE) ×2 IMPLANT
DRAPE LAPAROSCOPIC ABDOMINAL (DRAPES) ×2 IMPLANT
DRAPE LAPAROTOMY T 102X78X121 (DRAPES) IMPLANT
DRAPE LG THREE QUARTER DISP (DRAPES) ×4 IMPLANT
DRAPE SURG IRRIG POUCH 19X23 (DRAPES) ×2 IMPLANT
DRAPE UTILITY XL STRL (DRAPES) ×6 IMPLANT
DRAPE WARM FLUID 44X44 (DRAPE) ×4 IMPLANT
DRSG OPSITE POSTOP 4X8 (GAUZE/BANDAGES/DRESSINGS) ×2 IMPLANT
DRSG PAD ABDOMINAL 8X10 ST (GAUZE/BANDAGES/DRESSINGS) IMPLANT
DRSG TEGADERM 2-3/8X2-3/4 SM (GAUZE/BANDAGES/DRESSINGS) IMPLANT
DRSG TEGADERM 4X4.75 (GAUZE/BANDAGES/DRESSINGS) IMPLANT
ELECT REM PT RETURN 9FT ADLT (ELECTROSURGICAL) ×4
ELECTRODE REM PT RTRN 9FT ADLT (ELECTROSURGICAL) ×3 IMPLANT
EVACUATOR SILICONE 100CC (DRAIN) ×2 IMPLANT
GAUZE SPONGE 4X4 16PLY XRAY LF (GAUZE/BANDAGES/DRESSINGS) ×2 IMPLANT
GLOVE BIOGEL PI IND STRL 6.5 (GLOVE) ×2 IMPLANT
GLOVE BIOGEL PI IND STRL 7.0 (GLOVE) ×6 IMPLANT
GLOVE BIOGEL PI INDICATOR 6.5 (GLOVE)
GLOVE BIOGEL PI INDICATOR 7.0 (GLOVE) ×4
GLOVE ECLIPSE 8.0 STRL XLNG CF (GLOVE) ×10 IMPLANT
GLOVE INDICATOR 8.0 STRL GRN (GLOVE) ×8 IMPLANT
GOWN STRL NON-REIN LRG LVL3 (GOWN DISPOSABLE) ×4 IMPLANT
GOWN STRL REIN XL XLG (GOWN DISPOSABLE) ×16 IMPLANT
KIT BASIN OR (CUSTOM PROCEDURE TRAY) ×6 IMPLANT
LEGGING LITHOTOMY PAIR STRL (DRAPES) ×2 IMPLANT
LIGASURE IMPACT 36 18CM CVD LR (INSTRUMENTS) IMPLANT
NDL SAFETY ECLIPSE 18X1.5 (NEEDLE) IMPLANT
NEEDLE HYPO 18GX1.5 SHARP (NEEDLE)
NEEDLE HYPO 22GX1.5 SAFETY (NEEDLE) ×4 IMPLANT
NS IRRIG 1000ML POUR BTL (IV SOLUTION) ×8 IMPLANT
PACK BASIC VI WITH GOWN DISP (CUSTOM PROCEDURE TRAY) ×2 IMPLANT
PACK GENERAL/GYN (CUSTOM PROCEDURE TRAY) ×2 IMPLANT
PENCIL BUTTON HOLSTER BLD 10FT (ELECTRODE) ×6 IMPLANT
POUCH OSTOMY 2 PC DRNBL 2.25 (WOUND CARE) ×1 IMPLANT
POUCH OSTOMY DRNBL 2 1/4 (WOUND CARE) ×4
RETRACTOR LONE STAR DISPOSABLE (INSTRUMENTS) ×6 IMPLANT
RETRACTOR STAY HOOK 5MM (MISCELLANEOUS) ×2 IMPLANT
RETRACTOR WND ALEXIS 18 MED (MISCELLANEOUS) IMPLANT
RTRCTR WOUND ALEXIS 18CM MED (MISCELLANEOUS)
SCALPEL HARMONIC ACE (MISCELLANEOUS) IMPLANT
SCISSORS LAP 5X35 DISP (ENDOMECHANICALS) ×2 IMPLANT
SEALER TISSUE G2 CVD JAW 35 (ENDOMECHANICALS) ×1 IMPLANT
SEALER TISSUE G2 CVD JAW 45CM (ENDOMECHANICALS) ×1
SET IRRIG TUBING LAPAROSCOPIC (IRRIGATION / IRRIGATOR) ×2 IMPLANT
SHEARS HARMONIC 9CM CVD (BLADE) ×2 IMPLANT
SLEEVE ENDOPATH XCEL 5M (ENDOMECHANICALS) ×2 IMPLANT
SLEEVE SURGEON STRL (DRAPES) ×2 IMPLANT
SLEEVE XCEL OPT CAN 5 100 (ENDOMECHANICALS) ×8 IMPLANT
SPONGE GAUZE 4X4 12PLY (GAUZE/BANDAGES/DRESSINGS) ×2 IMPLANT
SPONGE LAP 18X18 X RAY DECT (DISPOSABLE) ×4 IMPLANT
SPONGE SURGIFOAM ABS GEL 12-7 (HEMOSTASIS) IMPLANT
STAPLER PROXIMATE 75MM BLUE (STAPLE) ×2 IMPLANT
STAPLER VISISTAT 35W (STAPLE) ×4 IMPLANT
SUCTION POOLE TIP (SUCTIONS) ×4 IMPLANT
SUT CHROMIC 2 0 SH (SUTURE) IMPLANT
SUT CHROMIC 3 0 SH 27 (SUTURE) IMPLANT
SUT PDS AB 1 CTX 36 (SUTURE) ×4 IMPLANT
SUT PROLENE 0 CT 2 (SUTURE) ×2 IMPLANT
SUT PROLENE 2 0 CT2 30 (SUTURE) IMPLANT
SUT PROLENE 2 0 KS (SUTURE) IMPLANT
SUT SILK 2 0 (SUTURE) ×4
SUT SILK 2 0 SH (SUTURE) ×2 IMPLANT
SUT SILK 2 0 SH CR/8 (SUTURE) ×4 IMPLANT
SUT SILK 2-0 18XBRD TIE 12 (SUTURE) ×3 IMPLANT
SUT SILK 3 0 (SUTURE) ×4
SUT SILK 3 0 SH CR/8 (SUTURE) ×4 IMPLANT
SUT SILK 3-0 18XBRD TIE 12 (SUTURE) ×3 IMPLANT
SUT VIC AB 2-0 SH 18 (SUTURE) ×6 IMPLANT
SUT VIC AB 2-0 SH 27 (SUTURE)
SUT VIC AB 2-0 SH 27X BRD (SUTURE) IMPLANT
SUT VIC AB 2-0 UR6 27 (SUTURE) ×4 IMPLANT
SUT VIC AB 3-0 SH 18 (SUTURE) ×2 IMPLANT
SUT VIC AB 4-0 SH 18 (SUTURE) IMPLANT
SUT VICRYL 0 UR6 27IN ABS (SUTURE) ×4 IMPLANT
SYR BULB IRRIGATION 50ML (SYRINGE) ×4 IMPLANT
SYS LAPSCP GELPORT 120MM (MISCELLANEOUS) ×4
SYSTEM LAPSCP GELPORT 120MM (MISCELLANEOUS) ×1 IMPLANT
TAPE UMBILICAL COTTON 1/8X30 (MISCELLANEOUS) ×4 IMPLANT
TOWEL OR 17X26 10 PK STRL BLUE (TOWEL DISPOSABLE) ×8 IMPLANT
TRAY FOLEY CATH 14FRSI W/METER (CATHETERS) ×2 IMPLANT
TRAY LAP CHOLE (CUSTOM PROCEDURE TRAY) ×4 IMPLANT
TROCAR XCEL 12X100 BLDLESS (ENDOMECHANICALS) IMPLANT
TROCAR XCEL BLUNT TIP 100MML (ENDOMECHANICALS) ×2 IMPLANT
TROCAR XCEL NON-BLD 11X100MML (ENDOMECHANICALS) IMPLANT
TROCAR XCEL NON-BLD 5MMX100MML (ENDOMECHANICALS) ×6 IMPLANT
TROCAR Z-THREAD BLADED 5X100MM (TROCAR) ×2 IMPLANT
TUBING FILTER THERMOFLATOR (ELECTROSURGICAL) ×4 IMPLANT
TUNNELER SHEATH ON-Q 16GX12 DP (PAIN MANAGEMENT) ×4 IMPLANT
WATER STERILE IRR 1000ML POUR (IV SOLUTION) ×2 IMPLANT
YANKAUER SUCT BULB TIP 10FT TU (MISCELLANEOUS) ×6 IMPLANT
YANKAUER SUCT BULB TIP NO VENT (SUCTIONS) ×4 IMPLANT

## 2012-12-27 NOTE — H&P (Signed)
Tyler Diaz  Sep 25, 1945 [id number]  CARE TEAM:  PCP: Rudi Heap, MD  Outpatient Care Team: Patient Care Team: Ernestina Penna, MD as PCP - General (Family Medicine) Ernestina Penna, MD as Attending Physician (Family Medicine) Ardeth Sportsman, MD as Consulting Physician (Colon and Rectal Surgery) Shirley Friar, MD as Consulting Physician (Gastroenterology) Michae Kava, MD as Consulting Physician (Medical Oncology)  Inpatient Treatment Team: Treatment Team: Attending Provider: Ardeth Sportsman, MD  This patient is a 67 y.o.male who presents today for surgical evaluation.   Reason for visit: Followup after neoadjuvant chemoradiation therapy for rectal cancer   Patient returns today doing relatively well. The razor blade burning feeling remains gone. He completed chemotherapy and radiation therapy on April 24. No incontinence to flatus or stool. Some irregular bowel movements. No fevers or chills.  He is feeling a little stronger. No new events. Can walk better.    Past Medical History  Diagnosis Date  . Mitral regurgitation due to partially flail posterior mitral valve leaflet   . Rectal cancer   . Hyperlipidemia   . Colon cancer 07/18/12 bx    rectum=Invasive adenocarcinoma w/ extracellular mucin,polyp=benign  . Hypertension     essential (benign)  . Rheumatic fever as child    hx  . Hearing loss     partial greater on left  . Tinnitus     left ear  . Sinusitis     seasonal  . Rectal bleeding     last month hx  . Arthritis     hands/knees  . Hematochezia     recent  . Heart murmur   . Bronchitis   . Hypothyroidism as child    hx of    Past Surgical History  Procedure Laterality Date  . Carpal tunnel release Right 2002  . Tonsillectomy      as child  . Inguinal hernia repair  1982    left side    History   Social History  . Marital Status: Married    Spouse Name: N/A    Number of Children: 2  . Years of Education: N/A   Occupational History   . Retired     Development worker, international aid   Social History Main Topics  . Smoking status: Former Smoker -- 1.50 packs/day for 20 years    Types: Cigarettes    Quit date: 07/27/1972  . Smokeless tobacco: Never Used  . Alcohol Use: No  . Drug Use: No     Comment: quit smoking 35 years ago  . Sexually Active: Not on file   Other Topics Concern  . 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  Married    Family History  Problem Relation Age of Onset  . Cancer Mother     abdominal ca ?ovarian?  . Cancer Sister     liver cancer    Current Facility-Administered Medications  Medication Dose Route Frequency Provider Last Rate Last Dose  . bupivacaine 0.25 % ON-Q pump DUAL CATH 300 mL  300 mL Other Continuous Ardeth Sportsman, MD      . cefoTEtan (CEFOTAN) 2 g in dextrose 5 % 50 mL IVPB  2 g Intravenous On Call to OR Ardeth Sportsman, MD      . clindamycin (CLEOCIN) 900 mg, gentamicin (GARAMYCIN) 240 mg in sodium chloride 0.9 % 1,000 mL for intraperitoneal lavage   Intraperitoneal To OR Ardeth Sportsman,  MD         Allergies  Allergen Reactions  . Shrimp (Shellfish Allergy) Other (See Comments)    Whelps and sick     BP 131/75  Pulse 94  Temp(Src) 98 F (36.7 C) (Oral)  Resp 20  SpO2 99% Review of Systems  Constitutional: Negative for fever, chills and diaphoresis.  HENT: Negative for sore throat, trouble swallowing and neck pain.  Eyes: Negative for photophobia and visual disturbance.  Respiratory: Negative for choking and shortness of breath.  Cardiovascular: Negative for chest pain and palpitations.  Gastrointestinal: Negative for nausea, vomiting, abdominal distention and rectal pain.  Genitourinary: Negative for dysuria, urgency, difficulty urinating and testicular pain.  Musculoskeletal: Negative for myalgias, arthralgias and gait problem.  Skin: Negative for color change and rash.  Neurological: Negative for dizziness,  speech difficulty, weakness and numbness.  Hematological: Negative for adenopathy.  Psychiatric/Behavioral: Negative for hallucinations, confusion and agitation.   Objective:   Physical Exam  Constitutional: He is oriented to person, place, and time. He appears well-developed and well-nourished. No distress.  HENT:  Head: Normocephalic.  Mouth/Throat: Oropharynx is clear and moist. No oropharyngeal exudate.  Eyes: Conjunctivae and EOM are normal. Pupils are equal, round, and reactive to light. No scleral icterus.  Neck: Normal range of motion. No tracheal deviation present.  Cardiovascular: Normal rate, normal heart sounds and intact distal pulses.  Pulmonary/Chest: Effort normal. No respiratory distress.  Abdominal: Soft. He exhibits no distension. There is no tenderness. Hernia confirmed negative in the right inguinal area and confirmed negative in the left inguinal area.  Musculoskeletal: Normal range of motion. He exhibits no tenderness.  Neurological: He is alert and oriented to person, place, and time. No cranial nerve deficit. He exhibits normal muscle tone. Coordination normal.  Skin: Skin is warm and dry. No rash noted. He is not diaphoretic.  Psychiatric: He has a normal mood and affect. His behavior is normal.    Results:   Labs: No results found for this or any previous visit (from the past 48 hour(s)).  Imaging / Studies: No results found.  Medications / Allergies: per chart  Antibiotics: Anti-infectives   Start     Dose/Rate Route Frequency Ordered Stop   12/27/12 0600  cefoTEtan (CEFOTAN) 2 g in dextrose 5 % 50 mL IVPB     2 g 100 mL/hr over 30 Minutes Intravenous On call to O.R. 12/26/12 1902 12/28/12 0559   12/26/12 1915  clindamycin (CLEOCIN) 900 mg, gentamicin (GARAMYCIN) 240 mg in sodium chloride 0.9 % 1,000 mL for intraperitoneal lavage      Intraperitoneal To Surgery 12/26/12 1902 12/27/12 1915      Assessment  [name]  67 y.o. male  Day of  Surgery  Procedure(s): LAPAROSCOPIC (POSSIBLE OPEN) LOW ANTERIOR RESECTION, OSTOMY, POSSIBLE APR   RIGID PROCTOSCOPY  Problem List:  Principal Problem:   Rectal cancer   Assessment:   Rectal cancer very distal status 11 weeks post neoadjuvant chemotherapy radiation therapy with some shrinkage. Need for proctectomy.   Plan:   I am hopeful that we have a better chance at sphincter preservation. However I stressed to him I would not sacrifice cure for that.  He understands the need for an ostomy regardless of low anterior resection vs. APR. Hopefully just temporary loop ileostomy.  I discussed the procedure of laparoscopic resection in detail:   The anatomy & physiology of the digestive tract was discussed.  The pathophysiology was discussed.  Natural history risks without surgery  was discussed.   I worked to give an overview of the disease and the frequent need to have multispecialty involvement.  I feel the risks of no intervention will lead to serious problems that outweigh the operative risks; therefore, I recommended a partial proctocolectomy to remove the pathology.  Laparoscopic & open techniques were discussed.  We will work to preserve anal & pelvic floor function without sacrificing cure.  Risks such as bleeding, infection, abscess, leak, reoperation, possible ostomy, hernia, heart attack, death, and other risks were discussed.  I noted a good likelihood this will help address the problem.   Goals of post-operative recovery were discussed as well.  We will work to minimize complications.  An educational handout on the pathology was given as well.  Questions were answered.    The patient expresses understanding & wishes to proceed with surgery.        Ardeth Sportsman, M.D., F.A.C.S. Gastrointestinal and Minimally Invasive Surgery [facility] [address] [place], [place] [address] [phone] Main / Paging   12/27/2012

## 2012-12-27 NOTE — Op Note (Addendum)
12/27/2012  1:22 PM  PATIENT:  Tyler Diaz  67 y.o. male  Patient Care Team: Ernestina Penna, MD as PCP - General (Family Medicine) Ernestina Penna, MD as Attending Physician (Family Medicine) Ardeth Sportsman, MD as Consulting Physician (Colon and Rectal Surgery) Shirley Friar, MD as Consulting Physician (Gastroenterology) Michae Kava, MD as Consulting Physician (Medical Oncology)  PRE-OPERATIVE DIAGNOSIS:  very distal rectal cancer  POST-OPERATIVE DIAGNOSIS:  very distal rectal cancer Umbilical incarcerated hernia LIH - incarcerated RIH   PROCEDURE:  Procedure(s): LAPAROSCOPIC LOW ANTERIOR RESECTION,  COLO-ANAL ANASTOMOSIS,  DIVERTING LOOP ILEOSTOMY, SPLENIC LEXURE MOBILIZATION,  PRIMARY INCARCERATED UMBILICAL HERNIA REPAIR  SURGEON:  Surgeon(s): Ardeth Sportsman, MD Romie Levee, MD - Asst  ANESTHESIA:   local and general  EBL:  Total I/O In: 4000 [I.V.:4000] Out: 930 [Urine:280; Blood:650]  Delay start of Pharmacological VTE agent (>24hrs) due to surgical blood loss or risk of bleeding:  no  DRAINS: (19Fr) Blake drain(s) in the pelvis   SPECIMEN:  Source of Specimen:  rectosigmoid colon - silk distal margin  DISPOSITION OF SPECIMEN:  PATHOLOGY  COUNTS:  YES  PLAN OF CARE: Admit to inpatient   PATIENT DISPOSITION:  PACU - hemodynamically stable.  INDICATION:    Pleasant gentleman with very low rectal cancer. He underwent neoadjuvant chemotherapy therapy and radiation. He's had some tumor shrinkage. I offered sphincter sparing surgery with possible need for abdominal perineal resection. Ultimately to treat the cancer, I recommended segmental resection:  The anatomy & physiology of the digestive tract was discussed.  The pathophysiology was discussed.  Natural history risks without surgery was discussed.   I worked to give an overview of the disease and the frequent need to have multispecialty involvement.  I feel the risks of no intervention will lead  to serious problems that outweigh the operative risks; therefore, I recommended a partial proctocolectomy to remove the pathology.  Laparoscopic & open techniques were discussed.  We will work to preserve anal & pelvic floor function without sacrificing cure.  Risks such as bleeding, infection, abscess, leak, reoperation, possible ostomy, hernia, heart attack, death, and other risks were discussed.  I noted a good likelihood this will help address the problem.   Goals of post-operative recovery were discussed as well.  We will work to minimize complications.  An educational handout on the pathology was given as well.  Questions were answered.    The patient expresses understanding & wishes to proceed with surgery.   OR FINDINGS:   Patient had Shrinkage and scarring of his rectal cancer. It involved the distal and mid rectum. More circumferential proximally. More distally, it seemed more prominent in the left lateral and posterior lateral aspect. The most distal margin around 5:00, 2 cm from the anal verge.  No obvious metastatic disease on visceral parietal peritoneum or liver.  He had a 1 cm umbilical hernia incarcerated with omentum.  He had a moderate indirect left inguinal hernia incarcerated with sigmoid colon epiploica appendages but not colon. He had a small indirect right inguinal hernia not incarcerated.  The anastomosis rests Close to the anal verge  DESCRIPTION:   Informed consent was confirmed.  The patient underwent general anaesthesia without difficulty.  The patient was positioned appropriately.  VTE prevention in place.  The patient's abdomen was clipped, prepped, & draped in a sterile fashion.  Surgical timeout confirmed our plan.  The patient was positioned in reverse Trendelenburg.  Abdominal entry was gained using optical entry technique  in the right upper abdomen.  Entry was clean.  I induced carbon dioxide insufflation.  Camera inspection revealed no injury.  Extra ports were  carefully placed under direct laparoscopic visualization.  I reduce some omentum out of an incarcerated umbilical hernia.  I reflected the greater omentum and the upper abdomen the small bowel in the upper abdomen. I scored the base of peritoneum of the right side of the mesentery of the left colon from the ligament of Treitz to the peritoneal rectal reflection.   The patient had The sigmoid colon with a moderate volume of the lateral and anterior suprapubic adhesions. These were carefully freed off.  Reduced out some epiploic appendages off a moderate left indirect inguinal hernia.  We noticed a smaller right inguinal hernia that was not incarcerated.  I elevated the sigmoid mesentery and got into the retro-mesenteric plane. We were able to identify the left ureter and gonadal vessels. We kept those posterior within the retroperitoneum and elevated the left colon mesentery off that.   I skeletonized the lymph nodes off the inferior mesenteric artery pedicle.  I went down to its takeoff from the aorta.  I isolated the inferior mesenteric vein off of the ligament of Treitz just cephalad to that as well.  After confirming the left ureter was out of the way, I went ahead and ligated the inferior mesenteric artery pedicle with bipolar EnSeal just near its takeoff from the aorta.  I did ligate the inferior mesenteric vein in a similar fashion.  We ensured hemostasis.  I then focused on pelvic dissection. Elevated the proximal reason rectum off the sacral promontory. I worked to preserve the nerves.   I continued distally and got into the avascular plane posterior to the mesorectum. This allowed me to help mobilize the rectum as well by freeing the mesorectum off the sacrum.  I mobilized along the lateral walls as well.   I mobilized the peritoneal coverings to the peritoneal reflection on both the right and left sides of the rectum.  I could see the right and left ureters and stayed away from them.  Continued  dissection to free the posterior mesorectum off the sacrum and coccyx all the way down to the levators. I primarily used cautery and careful blunt dissection. Used focused bipolar with the Enseal as well.  With that, I could help free the lateral stalk attachments. Indigo more anteriorly. He actually had a little more fat in the plane between the prostate and rectum. We carefully skeletonized and freed that off.    I placed a GelPort has a wound protector through a Pfannenstiel incision in the suprapubic region, taking care to avoid bladder injury.  We palpated to isolate the final few bands and then transected them laparoscopically until we had good circumferential dissection down to the levators all the way around.  We then did splenic flexure mobilization. I mobilized the proximal descending colon in a lateral to medial fashion freeing it off the left kidney and inferior pancreatic rim. Came anteriorly and freed the greater omentum off this as well as there were some adhesions to the spleen and we wished not to irritate that. We came around all the way to the mid transverse colon to get good mobilization. The middle colic on the left branch was the only thing remaining holding things out. I could get the splenic flexure to reach down to the suprapubic region. We felt that was good splenic flexure mobilization  I then transitioned down to the  perineum. I placed a 2-0 silk pursestring stitch distal to the margin and 1 cm. I then transected through the anoderm in the posterior circumference. Unable to come over the coccyx and enter into the true pelvis. I then was able to free off the proximal and anal canal off the pelvis to help complete transection. I did use a scalpel to remove the left posterior most distal margin of anoderm. We sent that off for frozen. He came back negative. We decided to proceed with sphincter sparing surgery and coloanal anastomosis.  Dr. Maisie Fus Found a spot in the descending colon  that would reach well. We transected back with a stapler. We took the mesentery really, incorporating the IMA and IMV pedicles with the specimen. We sent the specimen off. I brought up a Babcock clamp through the anus and she attached the descending colon to that. We brought that down carefully to make sure the mesentery came down straight and not twisted. It would reach in and into fashion. I therefore proceeded to do an end descending colon to anoderm anastomosis. I used 2-0 Vicryl horizontal interrupted mattress stitches to get a result. I did incorporate bites of the levators and sphincters with that to help secure it. I did at x12 stitches. Mucosa had some duskiness but was viable. Dr. Maisie Fus placed a drain as noted above with the tip down in the very distal pelvis.  She found the he'll cecal region and brought up a loop of terminal ileum through a cruciate incision in the right paramedian region at a premarked area by the ostomy nurses. Brought that up as a loop. The a few Vicryl stitches to the fascia to help hold it in place.  She did a final irrigation of antibiotic irrigation (clindamycin/gentamicin) these. Inspection revealed no bleeding. We removed all ports and the wound protector the GelPort.  We changed gown and gloves.  We aspirated the antibiotic irrigation.  Hemostasis was good.   Ureters & bowel uninjured.  The anastomosis looked healthy.  I placed On-Q catheter and sheaths into the preperitoneal space under direct palpation.  I removed CO2 gas out through the ports.  I closed the 5mm port sites using Monocryl stitch and sterile dressing.  I closed the Pfannenstiel wound using a 0 Vicryl vertical peritoneal closure and a #1 PDS transverse anterior rectal fascial closure. I closed the umbilical hernia through the supraumbilical curvilinear incision used to place a port. I used a 0 Vicryl interrupted transverse stitches to good result the  I closed the skin with some interrupted Monocryl stitches.  I placed antibiotic-soaked wicks into the closure at the corners & centrally x4 between those areas. I placed a sterile dressing.  OnQ catheters placed & sheaths peeled away.  Sterile dressings were placed.    I matured the loop ileostomy using 3-0 Vicryl interrupted stitches to good result. Finger could easily intubate the cephalad and caudad limbs. We placed an ostomy appliance.  Patient is being extubated go to recovery room. I discussed postop care with the patient in detail the office & in the holding area. Instructions are written. I'm about to locate family and discuss it with them as well.

## 2012-12-27 NOTE — Transfer of Care (Signed)
Immediate Anesthesia Transfer of Care Note  Patient: Tyler Diaz  Procedure(s) Performed: Procedure(s): LAPAROSCOPIC LOW ANTERIOR RESECTION, COLO-ANAL ANASTOMOSIS, DIVERTING LOOP ILEOSTOMY,SPLENIC LEXURE MOBILIZATION, PRIMARY INCARCERATED UMBILICAL HERNIA REPAIR (N/A)  Patient Location: PACU  Anesthesia Type:General  Level of Consciousness: awake and patient cooperative  Airway & Oxygen Therapy: Patient Spontanous Breathing and Patient connected to face mask oxygen  Post-op Assessment: Report given to PACU RN, Post -op Vital signs reviewed and stable and Patient moving all extremities X 4  Post vital signs: Reviewed and stable  Complications: No apparent anesthesia complications

## 2012-12-27 NOTE — Anesthesia Preprocedure Evaluation (Signed)
Anesthesia Evaluation  Patient identified by MRN, date of birth, ID band Patient awake    Reviewed: Allergy & Precautions, H&P , NPO status , Patient's Chart, lab work & pertinent test results  Airway Mallampati: II TM Distance: >3 FB Neck ROM: Full    Dental  (+) Teeth Intact and Dental Advisory Given   Pulmonary neg pulmonary ROS, asthma , former smoker,  breath sounds clear to auscultation  Pulmonary exam normal       Cardiovascular hypertension, Pt. on medications negative cardio ROS  + Valvular Problems/Murmurs MR Rhythm:Regular Rate:Normal + Systolic murmurs    Neuro/Psych negative neurological ROS  negative psych ROS   GI/Hepatic negative GI ROS, Neg liver ROS, Neoplasm rectum   Endo/Other  Hypothyroidism   Renal/GU negative Renal ROS  negative genitourinary   Musculoskeletal negative musculoskeletal ROS (+)   Abdominal   Peds  Hematology negative hematology ROS (+)   Anesthesia Other Findings   Reproductive/Obstetrics                           Anesthesia Physical Anesthesia Plan  ASA: III  Anesthesia Plan: General   Post-op Pain Management:    Induction: Intravenous  Airway Management Planned: Oral ETT  Additional Equipment:   Intra-op Plan:   Post-operative Plan: Extubation in OR  Informed Consent: I have reviewed the patients History and Physical, chart, labs and discussed the procedure including the risks, benefits and alternatives for the proposed anesthesia with the patient or authorized representative who has indicated his/her understanding and acceptance.   Dental advisory given  Plan Discussed with: CRNA  Anesthesia Plan Comments:         Anesthesia Quick Evaluation

## 2012-12-28 ENCOUNTER — Encounter (HOSPITAL_COMMUNITY): Payer: Self-pay | Admitting: Surgery

## 2012-12-28 LAB — CBC
HCT: 28.8 % — ABNORMAL LOW (ref 39.0–52.0)
MCHC: 33 g/dL (ref 30.0–36.0)
Platelets: 175 10*3/uL (ref 150–400)
RDW: 14.1 % (ref 11.5–15.5)
WBC: 12.7 10*3/uL — ABNORMAL HIGH (ref 4.0–10.5)

## 2012-12-28 LAB — BASIC METABOLIC PANEL
BUN: 17 mg/dL (ref 6–23)
Calcium: 8.2 mg/dL — ABNORMAL LOW (ref 8.4–10.5)
Creatinine, Ser: 1.11 mg/dL (ref 0.50–1.35)
GFR calc Af Amer: 76 mL/min — ABNORMAL LOW (ref >90)
GFR calc non Af Amer: 66 mL/min — ABNORMAL LOW (ref >90)

## 2012-12-28 MED ORDER — FERROUS SULFATE 325 (65 FE) MG PO TABS
325.0000 mg | ORAL_TABLET | Freq: Three times a day (TID) | ORAL | Status: DC
  Administered 2012-12-28 – 2012-12-30 (×9): 325 mg via ORAL
  Filled 2012-12-28 (×13): qty 1

## 2012-12-28 MED ORDER — METOPROLOL TARTRATE 12.5 MG HALF TABLET
12.5000 mg | ORAL_TABLET | Freq: Two times a day (BID) | ORAL | Status: DC
  Administered 2012-12-28 – 2012-12-30 (×6): 12.5 mg via ORAL
  Filled 2012-12-28 (×8): qty 1

## 2012-12-28 MED ORDER — SIMVASTATIN 10 MG PO TABS
10.0000 mg | ORAL_TABLET | Freq: Every day | ORAL | Status: DC
  Administered 2012-12-28 – 2012-12-30 (×3): 10 mg via ORAL
  Filled 2012-12-28 (×4): qty 1

## 2012-12-28 MED ORDER — BUPIVACAINE ON-Q PAIN PUMP (FOR ORDER SET NO CHG)
INJECTION | Status: AC
  Filled 2012-12-28: qty 1

## 2012-12-28 MED ORDER — BISMUTH SUBSALICYLATE 262 MG/15ML PO SUSP
30.0000 mL | Freq: Three times a day (TID) | ORAL | Status: DC | PRN
  Filled 2012-12-28: qty 236

## 2012-12-28 MED ORDER — MAGNESIUM SULFATE 40 MG/ML IJ SOLN
2.0000 g | Freq: Once | INTRAMUSCULAR | Status: AC
  Administered 2012-12-28: 2 g via INTRAVENOUS
  Filled 2012-12-28: qty 50

## 2012-12-28 MED ORDER — LISINOPRIL 5 MG PO TABS
5.0000 mg | ORAL_TABLET | Freq: Every day | ORAL | Status: DC
  Administered 2012-12-28 – 2013-01-01 (×5): 5 mg via ORAL
  Filled 2012-12-28 (×5): qty 1

## 2012-12-28 MED ORDER — LOPERAMIDE HCL 2 MG PO CAPS: 2.0000 mg | ORAL_CAPSULE | Freq: Three times a day (TID) | ORAL | Status: DC | PRN

## 2012-12-28 MED ORDER — BUPIVACAINE 0.25 % ON-Q PUMP DUAL CATH 300 ML
300.0000 mL | INJECTION | Status: DC
  Filled 2012-12-28: qty 300

## 2012-12-28 MED ORDER — HYDROCORTISONE 2.5 % RE CREA
1.0000 "application " | TOPICAL_CREAM | Freq: Four times a day (QID) | RECTAL | Status: DC | PRN
  Filled 2012-12-28: qty 28.35

## 2012-12-28 NOTE — Progress Notes (Signed)
ROSHAD HACK [id number] [birth year]  CARE TEAM:  PCP: Rudi Heap, MD  Outpatient Care Team: Patient Care Team: Ernestina Penna, MD as PCP - General (Family Medicine) Ernestina Penna, MD as Attending Physician (Family Medicine) Ardeth Sportsman, MD as Consulting Physician (Colon and Rectal Surgery) Shirley Friar, MD as Consulting Physician (Gastroenterology) Michae Kava, MD as Consulting Physician (Medical Oncology)  Inpatient Treatment Team: Treatment Team: Attending Provider: Ardeth Sportsman, MD; Registered Nurse: Georgeanne Nim Debrew, RN   Subjective:  Sore Pain medicines help but trying to wait for every 3-4 hours Using Sitz baths Wife & RN in room  Objective:  Vital signs:  Filed Vitals:   12/27/12 1746 12/27/12 2154 12/28/12 0209 12/28/12 0512  BP: 119/71 128/67 126/69 114/69  Pulse: 92 103 106 108  Temp: 97.4 F (36.3 C) 97.9 F (36.6 C) 97.5 F (36.4 C) 97.7 F (36.5 C)  TempSrc: Oral Oral Oral Oral  Resp: 16 18 18 18   Height:      Weight:      SpO2: 99% 99% 99% 100%    Last BM Date: 12/27/12  Intake/Output   Yesterday:  07/24 0701 - 07/25 0700 In: 6107.5 [I.V.:6107.5] Out: 2010 [Urine:860; Drains:500; Blood:650] This shift:  Total I/O In: 957.5 [I.V.:957.5] Out: 630 [Urine:450; Drains:180]  Bowel function:  Flatus: n  BM: n  Drain: Serosanguinous  Physical Exam:  General: Pt awake/alert/oriented x4 in no acute distress Eyes: PERRL, normal EOM.  Sclera clear.  No icterus Neuro: CN II-XII intact w/o focal sensory/motor deficits. Lymph: No head/neck/groin lymphadenopathy Psych:  No delerium/psychosis/paranoia HENT: Normocephalic, Mucus membranes moist.  No thrush Neck: Supple, No tracheal deviation Chest: No chest wall pain w good excursion CV:  Pulses intact.  Regular rhythm MS: Normal AROM mjr joints.  No obvious deformity Abdomen: Soft.  Nondistended.  Mildly tender at incisions only.  No evidence of peritonitis.  No  incarcerated hernias.  Ileostomy dusky pink Ext:  SCDs BLE.  No mjr edema.  No cyanosis Skin: No petechiae / purpura   Problem List:   Principal Problem:   Rectal cancer   Assessment  Tyler Diaz  67 y.o. male  1 Day Post-Op  Procedure(s): LAPAROSCOPIC LOW ANTERIOR RESECTION, COLO-ANAL ANASTOMOSIS, DIVERTING LOOP ILEOSTOMY,SPLENIC FLEXURE MOBILIZATION, PRIMARY INCARCERATED UMBILICAL HERNIA REPAIR LAPAROSCOPIC LOW ANTERIOR RESCECTION WITH COLOANAL ANASTOMOSIS DIVERTING LOOP ILEOSTOMY PRIMARY REPAIR INCARCERATED UMBILICAL HERNIA  Sore but stable  Plan:  -use Dilaudid more often - d/w pt -cont ice/tylenol/OnQ/sitz baths -ileostomy training -f/u path -follow HTN -VTE prophylaxis- SCDs, etc -mobilize as tolerated to help recovery  Ardeth Sportsman, M.D., F.A.C.S. Gastrointestinal and Minimally Invasive Surgery [facility] [address] [place], [place] [address] [phone] Main / Paging   12/28/2012   Results:   Labs: Results for orders placed during the hospital encounter of 12/27/12 (from the past 48 hour(s))  BASIC METABOLIC PANEL     Status: Abnormal   Collection Time    12/28/12  4:21 AM      Result Value Range   Sodium 136  135 - 145 mEq/L   Potassium 4.8  3.5 - 5.1 mEq/L   Chloride 103  96 - 112 mEq/L   CO2 25  19 - 32 mEq/L   Glucose, Bld 128 (*) 70 - 99 mg/dL   BUN 17  6 - 23 mg/dL   Creatinine, Ser 3.87  0.50 - 1.35 mg/dL   Calcium 8.2 (*) 8.4 - 10.5  mg/dL   GFR calc non Af Amer 66 (*) >90 mL/min   GFR calc Af Amer 76 (*) >90 mL/min   Comment:            The eGFR has been calculated     using the CKD EPI equation.     This calculation has not been     validated in all clinical     situations.     eGFR's persistently     <90 mL/min signify     possible Chronic Kidney Disease.  CBC     Status: Abnormal   Collection Time    12/28/12  4:21 AM      Result Value Range   WBC 12.7 (*) 4.0 - 10.5 K/uL   RBC  2.90 (*) 4.22 - 5.81 MIL/uL   Hemoglobin 9.5 (*) 13.0 - 17.0 g/dL   HCT 40.9 (*) 81.1 - 91.4 %   MCV 99.3  78.0 - 100.0 fL   MCH 32.8  26.0 - 34.0 pg   MCHC 33.0  30.0 - 36.0 g/dL   RDW 78.2  95.6 - 21.3 %   Platelets 175  150 - 400 K/uL  MAGNESIUM     Status: None   Collection Time    12/28/12  4:21 AM      Result Value Range   Magnesium 1.5  1.5 - 2.5 mg/dL    Imaging / Studies: No results found.  Medications / Allergies: per chart  Antibiotics: Anti-infectives   Start     Dose/Rate Route Frequency Ordered Stop   12/27/12 2000  cefoTEtan (CEFOTAN) 2 g in dextrose 5 % 50 mL IVPB     2 g 100 mL/hr over 30 Minutes Intravenous Every 12 hours 12/27/12 1515 12/27/12 2148   12/27/12 1025  clindamycin (CLEOCIN) 900 mg, gentamicin (GARAMYCIN) 240 mg in sodium chloride 0.9 % 1,000 mL for intraperitoneal lavage  Status:  Discontinued       As needed 12/27/12 1025 12/27/12 1331   12/27/12 0600  cefoTEtan (CEFOTAN) 2 g in dextrose 5 % 50 mL IVPB     2 g 100 mL/hr over 30 Minutes Intravenous On call to O.R. 12/26/12 1902 12/27/12 0800   12/26/12 1915  clindamycin (CLEOCIN) 900 mg, gentamicin (GARAMYCIN) 240 mg in sodium chloride 0.9 % 1,000 mL for intraperitoneal lavage  Status:  Discontinued      Intraperitoneal To Surgery 12/26/12 1902 12/27/12 1452

## 2012-12-28 NOTE — Anesthesia Postprocedure Evaluation (Signed)
Anesthesia Post Note  Patient: Tyler Diaz  Procedure(s) Performed: Procedure(s) (LRB): LAPAROSCOPIC LOW ANTERIOR RESECTION, COLO-ANAL ANASTOMOSIS, DIVERTING LOOP ILEOSTOMY,SPLENIC FLEXURE MOBILIZATION, PRIMARY INCARCERATED UMBILICAL HERNIA REPAIR (N/A) LAPAROSCOPIC LOW ANTERIOR RESCECTION WITH COLOANAL ANASTOMOSIS (N/A) DIVERTING LOOP ILEOSTOMY (N/A) PRIMARY REPAIR INCARCERATED UMBILICAL HERNIA  Anesthesia type: General  Patient location: PACU  Post pain: Pain level controlled  Post assessment: Post-op Vital signs reviewed  Last Vitals:  Filed Vitals:   12/28/12 1003  BP: 134/65  Pulse: 105  Temp: 36.9 C  Resp: 20    Post vital signs: Reviewed  Level of consciousness: sedated  Complications: No apparent anesthesia complications

## 2012-12-29 LAB — CREATININE, SERUM: Creatinine, Ser: 1.04 mg/dL (ref 0.50–1.35)

## 2012-12-29 LAB — POTASSIUM: Potassium: 4.6 mEq/L (ref 3.5–5.1)

## 2012-12-29 LAB — HEMOGLOBIN: Hemoglobin: 8.4 g/dL — ABNORMAL LOW (ref 13.0–17.0)

## 2012-12-29 NOTE — Consult Note (Signed)
WOC ostomy consult: First post op visit.  Wife present Stoma type/location: RLQ ileostomy Stomal assessment/size: round, moist1 and 1/2 inches, dark red, functioning, slightly budded, os at 12 o'clock Peristomal assessment: intact in the immediate parastomal area, medical adhesive related skin injury (MARSI) from 2-3 o'clock and from 7-11 o'clock. Unroofed blister reveals, partial thickness tissue injury. Treatment options for stomal/peristomal skin: I will dust the affected areas with pectin powder and seal with no-sting barrier film. Output Thin green/brown stool Ostomy pouching: 2pc. Skin barrier (cut to avoid denuded areas of MARSI) [place] #[id number], Pouch #234 Education provided: Patient and wife instructed on pouch emptying; patient accompanied this writer into bathroom for demonstration of emptying and cleaning bottom of tail closure with toilet paper "wicks".  Assisted back to bed for demonstration of pouch removal, stoma sizing, preparation of pouching system and application of pouching system.  Patient able to give return demonstration of pouch closure.  Noted to still be somewhat weak and tentative, but asking appropriate questions.  I will continue teaching over the next few days, but recommend consideration of [facility] services for continued support during the early days at home as stomas changes size and patient returns to activities of daily living.  If you agree, please order. I will follow along with you. Thanks, Ladona Mow, MSN, RN, [facility], CWOCN [phone])

## 2012-12-29 NOTE — Progress Notes (Signed)
2 Days Post-Op  Subjective: Coughing up phlegm but maxing out IS.  Ambulatory and tolerating full liquids without nausea or vomiting  Objective: Vital signs in last 24 hours: Temp:  [98.3 F (36.8 C)-99.3 F (37.4 C)] 98.4 F (36.9 C) (07/26 0533) Pulse Rate:  [73-111] 110 (07/26 0533) Resp:  [18-20] 20 (07/26 0533) BP: (120-136)/(58-68) 126/66 mmHg (07/26 0533) SpO2:  [95 %-98 %] 98 % (07/26 0533) FiO2 (%):  [2 %] 2 % (07/25 1003) Last BM Date: 12/29/12  Intake/Output from previous day: 07/25 0701 - 07/26 0700 In: 1733.3 [P.O.:480; I.V.:1253.3] Out: 1870 [Urine:1150; Drains:145; Stool:575] Intake/Output this shift: Total I/O In: -  Out: 200 [Stool:200]  General appearance: alert, cooperative and no distress Resp: nonlabored, max IS and good effort Cardio: tachy GI: soft, appropriate tenderness, he does have some mild distension, slight erythema around one of the lower wounds ?bruising, JP thin bloody drainage, no peritoneal signs.  ostomy functioning well  Lab Results:   Recent Labs  12/28/12 0421 12/29/12 0426  WBC 12.7*  --   HGB 9.5* 8.4*  HCT 28.8*  --   PLT 175  --    BMET  Recent Labs  12/28/12 0421 12/29/12 0426  NA 136  --   K 4.8 4.6  CL 103  --   CO2 25  --   GLUCOSE 128*  --   BUN 17  --   CREATININE 1.11 1.04  CALCIUM 8.2*  --    PT/INR No results found for this basename: LABPROT, INR,  in the last 72 hours ABG No results found for this basename: PHART, PCO2, PO2, HCO3,  in the last 72 hours  Studies/Results: No results found.  Anti-infectives: Anti-infectives   Start     Dose/Rate Route Frequency Ordered Stop   12/27/12 2000  cefoTEtan (CEFOTAN) 2 g in dextrose 5 % 50 mL IVPB     2 g 100 mL/hr over 30 Minutes Intravenous Every 12 hours 12/27/12 1515 12/27/12 2148   12/27/12 1025  clindamycin (CLEOCIN) 900 mg, gentamicin (GARAMYCIN) 240 mg in sodium chloride 0.9 % 1,000 mL for intraperitoneal lavage  Status:  Discontinued       As  needed 12/27/12 1025 12/27/12 1331   12/27/12 0600  cefoTEtan (CEFOTAN) 2 g in dextrose 5 % 50 mL IVPB     2 g 100 mL/hr over 30 Minutes Intravenous On call to O.R. 12/26/12 1902 12/27/12 0800   12/26/12 1915  clindamycin (CLEOCIN) 900 mg, gentamicin (GARAMYCIN) 240 mg in sodium chloride 0.9 % 1,000 mL for intraperitoneal lavage  Status:  Discontinued      Intraperitoneal To Surgery 12/26/12 1902 12/27/12 1452      Assessment/Plan: s/p Procedure(s): LAPAROSCOPIC LOW ANTERIOR RESECTION, COLO-ANAL ANASTOMOSIS, DIVERTING LOOP ILEOSTOMY,SPLENIC FLEXURE MOBILIZATION, PRIMARY INCARCERATED UMBILICAL HERNIA REPAIR (N/A) LAPAROSCOPIC LOW ANTERIOR RESCECTION WITH COLOANAL ANASTOMOSIS (N/A) DIVERTING LOOP ILEOSTOMY (N/A) PRIMARY REPAIR INCARCERATED UMBILICAL HERNIA continue to mobilize and pulmonary toilet.  He looks pretty good today but I do not plan on advancing diet because he has some mild distension though his ostomy is working well.    LOS: 2 days    Lodema Pilot DAVID 12/29/2012

## 2012-12-30 LAB — CREATININE, SERUM
GFR calc Af Amer: 79 mL/min — ABNORMAL LOW (ref >90)
GFR calc non Af Amer: 68 mL/min — ABNORMAL LOW (ref >90)

## 2012-12-30 MED ORDER — HYDROCODONE-ACETAMINOPHEN 5-325 MG PO TABS
1.0000 | ORAL_TABLET | ORAL | Status: DC | PRN
  Filled 2012-12-30: qty 1

## 2012-12-30 NOTE — Progress Notes (Signed)
3 Days Post-Op  Subjective: No nausea or vomiting.  Tolerating full liquids. Pain about the same  Objective: Vital signs in last 24 hours: Temp:  [98.1 F (36.7 C)-98.2 F (36.8 C)] 98.1 F (36.7 C) (07/26 2225) Pulse Rate:  [98-111] 98 (07/27 1031) Resp:  [18] 18 (07/26 2225) BP: (110-120)/(62-74) 120/70 mmHg (07/27 1031) SpO2:  [96 %-97 %] 96 % (07/26 2225) Weight:  [193 lb 2 oz (87.6 kg)] 193 lb 2 oz (87.6 kg) (07/27 0650) Last BM Date: 12/29/12 (r quad colosomty)  Intake/Output from previous day: 07/26 0701 - 07/27 0700 In: 1396.7 [P.O.:240; I.V.:1156.7] Out: 1990 [Urine:1100; Drains:90; Stool:800] Intake/Output this shift: Total I/O In: -  Out: 100 [Urine:100]  General appearance: alert, cooperative and no distress Resp: nonlabored Cardio: normal rate GI: soft, appropriate tenderness, nondistended today, wounds okay, wicks removed, ONQ empty and removed.  he has some penile and scrotal swelling, JP ss. Ostomy working well, skin lateral to ileostomy irritated from prior blistering but no infection.  Lab Results:   Recent Labs  12/28/12 0421 12/29/12 0426 12/30/12 0406  WBC 12.7*  --   --   HGB 9.5* 8.4* 8.2*  HCT 28.8*  --   --   PLT 175  --   --    BMET  Recent Labs  12/28/12 0421 12/29/12 0426 12/30/12 0406  NA 136  --   --   K 4.8 4.6 4.7  CL 103  --   --   CO2 25  --   --   GLUCOSE 128*  --   --   BUN 17  --   --   CREATININE 1.11 1.04 1.08  CALCIUM 8.2*  --   --    PT/INR No results found for this basename: LABPROT, INR,  in the last 72 hours ABG No results found for this basename: PHART, PCO2, PO2, HCO3,  in the last 72 hours  Studies/Results: No results found.  Anti-infectives: Anti-infectives   Start     Dose/Rate Route Frequency Ordered Stop   12/27/12 2000  cefoTEtan (CEFOTAN) 2 g in dextrose 5 % 50 mL IVPB     2 g 100 mL/hr over 30 Minutes Intravenous Every 12 hours 12/27/12 1515 12/27/12 2148   12/27/12 1025  clindamycin  (CLEOCIN) 900 mg, gentamicin (GARAMYCIN) 240 mg in sodium chloride 0.9 % 1,000 mL for intraperitoneal lavage  Status:  Discontinued       As needed 12/27/12 1025 12/27/12 1331   12/27/12 0600  cefoTEtan (CEFOTAN) 2 g in dextrose 5 % 50 mL IVPB     2 g 100 mL/hr over 30 Minutes Intravenous On call to O.R. 12/26/12 1902 12/27/12 0800   12/26/12 1915  clindamycin (CLEOCIN) 900 mg, gentamicin (GARAMYCIN) 240 mg in sodium chloride 0.9 % 1,000 mL for intraperitoneal lavage  Status:  Discontinued      Intraperitoneal To Surgery 12/26/12 1902 12/27/12 1452      Assessment/Plan: s/p Procedure(s): LAPAROSCOPIC LOW ANTERIOR RESECTION, COLO-ANAL ANASTOMOSIS, DIVERTING LOOP ILEOSTOMY,SPLENIC FLEXURE MOBILIZATION, PRIMARY INCARCERATED UMBILICAL HERNIA REPAIR (N/A) LAPAROSCOPIC LOW ANTERIOR RESCECTION WITH COLOANAL ANASTOMOSIS (N/A) DIVERTING LOOP ILEOSTOMY (N/A) PRIMARY REPAIR INCARCERATED UMBILICAL HERNIA He looks okay.  not much change from yesterday but continues to tolerate full liquids.  ostomy working well.  ONQ removed today.  scrotal and penile swelling from fluid but able to void . Will try advancing diet.  Ostomy teaching and wound care.    LOS: 3 days    Tyler Diaz DAVID 12/30/2012

## 2012-12-31 ENCOUNTER — Encounter (INDEPENDENT_AMBULATORY_CARE_PROVIDER_SITE_OTHER): Payer: Self-pay | Admitting: Surgery

## 2012-12-31 DIAGNOSIS — I10 Essential (primary) hypertension: Secondary | ICD-10-CM

## 2012-12-31 DIAGNOSIS — D649 Anemia, unspecified: Secondary | ICD-10-CM

## 2012-12-31 LAB — CBC
HCT: 26.5 % — ABNORMAL LOW (ref 39.0–52.0)
MCHC: 32.5 g/dL (ref 30.0–36.0)
MCV: 100.4 fL — ABNORMAL HIGH (ref 78.0–100.0)
RDW: 14.3 % (ref 11.5–15.5)

## 2012-12-31 LAB — COMPREHENSIVE METABOLIC PANEL
Albumin: 2.3 g/dL — ABNORMAL LOW (ref 3.5–5.2)
BUN: 12 mg/dL (ref 6–23)
Chloride: 101 mEq/L (ref 96–112)
Creatinine, Ser: 1.05 mg/dL (ref 0.50–1.35)
Total Bilirubin: 0.5 mg/dL (ref 0.3–1.2)
Total Protein: 5 g/dL — ABNORMAL LOW (ref 6.0–8.3)

## 2012-12-31 LAB — CREATININE, SERUM
Creatinine, Ser: 0.99 mg/dL (ref 0.50–1.35)
GFR calc non Af Amer: 82 mL/min — ABNORMAL LOW (ref >90)

## 2012-12-31 LAB — HEMOGLOBIN: Hemoglobin: 9.2 g/dL — ABNORMAL LOW (ref 13.0–17.0)

## 2012-12-31 MED ORDER — LACTATED RINGERS IV BOLUS (SEPSIS)
1000.0000 mL | Freq: Once | INTRAVENOUS | Status: AC
  Administered 2012-12-31: 1000 mL via INTRAVENOUS

## 2012-12-31 MED ORDER — LACTATED RINGERS IV SOLN: INTRAVENOUS | Status: DC

## 2012-12-31 MED ORDER — FENTANYL CITRATE 0.05 MG/ML IJ SOLN
25.0000 ug | INTRAMUSCULAR | Status: DC | PRN
  Administered 2012-12-31 – 2013-01-01 (×9): 50 ug via INTRAVENOUS
  Filled 2012-12-31 (×9): qty 2

## 2012-12-31 MED ORDER — PROMETHAZINE HCL 25 MG/ML IJ SOLN: 12.5000 mg | Freq: Four times a day (QID) | INTRAMUSCULAR | Status: DC | PRN

## 2012-12-31 MED ORDER — KCL IN DEXTROSE-NACL 20-5-0.45 MEQ/L-%-% IV SOLN
INTRAVENOUS | Status: DC
  Administered 2012-12-31: 22:00:00 via INTRAVENOUS
  Administered 2012-12-31: 1000 mL via INTRAVENOUS
  Filled 2012-12-31 (×3): qty 1000

## 2012-12-31 MED ORDER — METOPROLOL TARTRATE 1 MG/ML IV SOLN
5.0000 mg | Freq: Four times a day (QID) | INTRAVENOUS | Status: DC
  Administered 2012-12-31 – 2013-01-02 (×8): 5 mg via INTRAVENOUS
  Filled 2012-12-31 (×13): qty 5

## 2012-12-31 MED ORDER — ONDANSETRON HCL 4 MG/2ML IJ SOLN
4.0000 mg | Freq: Four times a day (QID) | INTRAMUSCULAR | Status: DC
  Administered 2012-12-31 – 2013-01-03 (×12): 4 mg via INTRAVENOUS
  Filled 2012-12-31 (×13): qty 2

## 2012-12-31 MED ORDER — PROMETHAZINE HCL 25 MG/ML IJ SOLN
6.2500 mg | Freq: Four times a day (QID) | INTRAMUSCULAR | Status: DC | PRN
  Administered 2012-12-31: 12.5 mg via INTRAVENOUS
  Filled 2012-12-31: qty 1

## 2012-12-31 MED ORDER — METOCLOPRAMIDE HCL 5 MG/ML IJ SOLN
5.0000 mg | Freq: Four times a day (QID) | INTRAMUSCULAR | Status: DC | PRN
  Administered 2012-12-31: 10 mg via INTRAVENOUS
  Filled 2012-12-31: qty 2

## 2012-12-31 NOTE — Progress Notes (Signed)
INITIAL NUTRITION ASSESSMENT  DOCUMENTATION CODES Per approved criteria  -Not Applicable   INTERVENTION: - Diet advancement per MD - Used teach back method to educate pt and wife on diet for ileostomy, handouts and RD contact information provided, both expressed understanding - expect good compliance - Will continue to monitor   NUTRITION DIAGNOSIS: Altered GI function related to nausea/vomiting as evidenced by pt report.   Goal: Advance diet as tolerated to low fiber diet  Monitor:  Weights, labs, diet advancement, nausea/vomiting  Reason for Assessment: Consult for education   67 y.o. male  Admitting Dx: Rectal cancer  ASSESSMENT: Pt with rectal CA s/p chemoradiation. POD# 4 low anterior resection, colo-anal anastomosis, diverting loop ileostomy, umbilical hernia repair. Noted pt with nausea/vomiting since earlier this morning and c/o nausea still, notified RN. Current diet order is NPO. Labs and medications reviewed. Sodium slightly low. Pt was eating well PTA, 3 meals/day with good appetite and stable weight.   Had detailed discussion with pt and wife regarding diet for new ileostomy. Discussed importance of low fiber foods for the first 6 weeks and importance of adequate hydration. Encouraged small frequent meals. Sample meal plan discussed. Foods that cause odors as well as foods that can thin/thicken stool discussed.    Height: Ht Readings from Last 1 Encounters:  12/27/12 5\' 10"  (1.778 m)    Weight: Wt Readings from Last 1 Encounters:  12/30/12 193 lb 2 oz (87.6 kg)    Ideal Body Weight: 166 lb  % Ideal Body Weight: 116%  Wt Readings from Last 10 Encounters:  12/30/12 193 lb 2 oz (87.6 kg)  12/30/12 193 lb 2 oz (87.6 kg)  12/21/12 187 lb (84.823 kg)  10/31/12 189 lb (85.73 kg)  08/03/12 187 lb 4.8 oz (84.959 kg)  08/03/12 188 lb 4.8 oz (85.412 kg)  07/27/12 187 lb (84.823 kg)    Usual Body Weight: 187 lb  % Usual Body Weight: 103%  BMI:  Body mass  index is 27.71 kg/(m^2).  Estimated Nutritional Needs: Kcal: 1800-2000 Protein: 85-105g Fluid: 1.8-2L/day  Skin: Abdominal and perineum incision, edema in scrotum and penis per WOC RN  Diet Order: NPO  EDUCATION NEEDS: -Education needs addressed - educated pt and wife on diet for new ileostomy    Intake/Output Summary (Last 24 hours) at 12/31/12 1341 Last data filed at 12/31/12 1005  Gross per 24 hour  Intake    270 ml  Output   2325 ml  Net  -2055 ml    Last BM: 7/28 from ileostomy  Labs:   Recent Labs Lab 12/28/12 0421  12/30/12 0406 12/31/12 0350 12/31/12 0820  NA 136  --   --   --  134*  K 4.8  < > 4.7 4.4 4.3  CL 103  --   --   --  101  CO2 25  --   --   --  25  BUN 17  --   --   --  12  CREATININE 1.11  < > 1.08 0.99 1.05  CALCIUM 8.2*  --   --   --  9.0  MG 1.5  --   --   --   --   GLUCOSE 128*  --   --   --  99  < > = values in this interval not displayed.  CBG (last 3)  No results found for this basename: GLUCAP,  in the last 72 hours  Scheduled Meds: . acetaminophen  1,000 mg Oral TID  .  antiseptic oral rinse  15 mL Mouth Rinse BID  . heparin subcutaneous  5,000 Units Subcutaneous Q8H  . lip balm  1 application Topical BID  . lisinopril  5 mg Oral Daily  . metoprolol  5 mg Intravenous Q6H  . multivitamin with minerals  1 tablet Oral Daily  . ondansetron  4 mg Intravenous Q6H  . saccharomyces boulardii  250 mg Oral BID    Continuous Infusions: . dextrose 5 % and 0.45 % NaCl with KCl 20 mEq/L 1,000 mL (12/31/12 1033)    Past Medical History  Diagnosis Date  . Mitral regurgitation due to partially flail posterior mitral valve leaflet   . Rectal cancer   . Hyperlipidemia   . Colon cancer 07/18/12 bx    rectum=Invasive adenocarcinoma w/ extracellular mucin,polyp=benign  . Hypertension     essential (benign)  . Rheumatic fever as child    hx  . Hearing loss     partial greater on left  . Tinnitus     left ear  . Sinusitis     seasonal   . Rectal bleeding     last month hx  . Arthritis     hands/knees  . Hematochezia     recent  . Heart murmur   . Bronchitis   . Hypothyroidism as child    hx of    Past Surgical History  Procedure Laterality Date  . Carpal tunnel release Right 2002  . Tonsillectomy      as child  . Inguinal hernia repair  1982    left side  . Laparoscopic low anterior resection N/A 12/27/2012    Procedure: LAPAROSCOPIC LOW ANTERIOR RESECTION, COLO-ANAL ANASTOMOSIS, DIVERTING LOOP ILEOSTOMY,SPLENIC FLEXURE MOBILIZATION, PRIMARY INCARCERATED UMBILICAL HERNIA REPAIR;  Surgeon: Ardeth Sportsman, MD;  Location: [facility];  Service: General;  Laterality: N/A;  . Laparoscopic low anterior rescection with coloanal anastomosis N/A 12/27/2012    Procedure: LAPAROSCOPIC LOW ANTERIOR RESCECTION WITH COLOANAL ANASTOMOSIS;  Surgeon: Ardeth Sportsman, MD;  Location: [facility];  Service: General;  Laterality: N/A;  . Ileostomy N/A 12/27/2012    Procedure: DIVERTING LOOP ILEOSTOMY;  Surgeon: Ardeth Sportsman, MD;  Location: [facility];  Service: General;  Laterality: N/A;  . Umbilical hernia repair  12/27/2012    Procedure: PRIMARY REPAIR INCARCERATED UMBILICAL HERNIA;  Surgeon: Ardeth Sportsman, MD;  Location: [facility];  Service: General;;     Levon Hedger MS, RD, LDN [phone] Pager [phone] After Hours Pager

## 2012-12-31 NOTE — Care Management Note (Signed)
    Page 1 of 2   01/02/2013     1:14:33 PM   CARE MANAGEMENT NOTE 01/02/2013  Patient:  Tyler Diaz, Tyler Diaz   Account Number:  [id number]  Date Initiated:  12/28/2012  Documentation initiated by:  Lorenda Ishihara  Subjective/Objective Assessment:   67 yo male admitted s/p LAR, ileostomy, hernia. PTA lived at home with spouse.     Action/Plan:   Home when stable   Anticipated DC Date:  01/03/2013   Anticipated DC Plan:  HOME W HOME HEALTH SERVICES      DC Planning Services  CM consult      [facility] Choice  HOME HEALTH   Choice offered to / List presented to:  C-3 Spouse        HH arranged  HH-1 RN      [facility] agency  [facility]   Status of service:  Completed, signed off Medicare Important Message given?   (If response is "NO", the following Medicare IM given date fields will be blank) Date Medicare IM given:   Date Additional Medicare IM given:    Discharge Disposition:  HOME W HOME HEALTH SERVICES  Per UR Regulation:  Reviewed for med. necessity/level of care/duration of stay  If discussed at Long Length of Stay Meetings, dates discussed:    Comments:  01-02-13 Lorenda Ishihara RN CM 1300 Follow up with patient and spouse regarding [facility] choice. Patient request [facility]. Contacted [facility] to arrange RN for ostomy care. Will also need IVF 3x/week for the next 4 weeks. [facility] will assist with this as well, arranged with Pam from [facility]. Patient will need PICC placed prior to d/c.  12-31-12 Lorenda Ishihara RN CM 1200 Spoke with patient and wife at bedside. Discussed likely need for [facility] RN to assist with ostomy care. Provided wife with a list for choice. Will f/u with her later.

## 2012-12-31 NOTE — Consult Note (Signed)
WOC ostomy consult  Abbreviated visit today as patient is feeling tired and ill from events of the early morning (nausea and vomiting, very edematous scrotum and penis).  He has been up in the chair for several hours and is going to get back in the bed momentarily.  He is independent with pouch emptying now and for his convenience, I have changed only the pouch (not the skin barrier today.  I will return either later today or tomorrow am to change the skin barrier.  Blistered area is improving and I will be able to cover with pouching system with next pouch change.  Patient established with [facility] post acute sampling program today. Thanks, Ladona Mow, MSN, RN, [facility], CWOCN [phone])

## 2012-12-31 NOTE — Progress Notes (Signed)
Pt with c/o nausea beginning around 2200, but dozed off; called RN for Phenergan at 2am. Administered IV Phenergan, and pt vomited shortly after administration. Large amount of watery but bright green fluid. Applied cool wet cloth to pts forehead, changed gown/linen, and encouraged pt to allow IV med to kick in. Currently, one hour later, pt without complaints of n/v. Will continue to monitor. Tambra Muller [place], [place] 3:09 AM 12/31/2012

## 2012-12-31 NOTE — Progress Notes (Addendum)
Tyler Diaz [id number] 1946/01/27  CARE TEAM:  PCP: Tyler Heap, MD  Outpatient Care Team: Patient Care Team: Tyler Penna, MD as PCP - General (Family Medicine) Tyler Sportsman, MD as Consulting Physician (Colon and Rectal Surgery) Tyler Friar, MD as Consulting Physician (Gastroenterology) Tyler Kava, MD as Consulting Physician (Medical Oncology) Tyler Bosch, MD as Consulting Physician (Radiation Oncology)  Inpatient Treatment Team: Treatment Team: Attending Provider: Ardeth Sportsman, MD; Registered Nurse: Tyler Bellows, RN; Technician: Tyler Diaz, NT; Registered Nurse: Tyler Axon, RN; Technician: Tyler Diaz, NT; Registered Nurse: Tyler Lynder Parents, RN   Subjective:  Vomited last night Feels distended & nauseated Pain controlled - only brief nausea w Dilaudid Walked a little Wife in room  Objective:  Vital signs:  Filed Vitals:   12/30/12 1031 12/30/12 1400 12/30/12 2010 12/31/12 0538  BP: 120/70 116/73 141/72 142/76  Pulse: 98 94 99 108  Temp:  97.8 F (36.6 C) 97.8 F (36.6 C) 98.7 F (37.1 C)  TempSrc:  Oral Oral Oral  Resp:  18 16 18   Height:      Weight:      SpO2:  97% 99% 98%    Last BM Date: 12/31/12 (ostomy)  Intake/Output   Yesterday:  07/27 0701 - 07/28 0700 In: 480 [P.O.:480] Out: 1985 [Urine:1300; Emesis/NG output:350; Drains:145; Stool:190] This shift:     Bowel function:  Flatus: In ileostomy bag  BM: Thick succus in bag  Drain: Seroous  Physical Exam:  General: Pt awake/alert/oriented x4 in mild distress.  Tired but not toxic Eyes: PERRL, normal EOM.  Sclera clear.  No icterus Neuro: CN II-XII intact w/o focal sensory/motor deficits. Lymph: No head/neck/groin lymphadenopathy Psych:  No delerium/psychosis/paranoia HENT: Normocephalic, Mucus membranes moist.  No thrush Neck: Supple, No tracheal deviation Chest: No chest wall pain w good excursion CV:  Pulses intact.   Regular rhythm MS: Normal AROM mjr joints.  No obvious deformity Abdomen: Soft.  Moderately distended.  Mildly tender at incisions only.  No evidence of peritonitis.  No incarcerated hernias.  Ileostomy dusky pink.  Incisions closed GU: Scrotal edema moderate Ext:  SCDs BLE.  1-2+ edema.  No cyanosis Skin: No petechiae / purpura   Problem List:   Principal Problem:   Rectal cancer - distal rT4a,rN2brM0 by MRI Active Problems:   Hypertension   Vertigo   Assessment  Tyler Diaz  67 y.o. male  4 Days Post-Op   LAPAROSCOPIC LOW ANTERIOR RESCECTION WITH COLOANAL ANASTOMOSIS  DIVERTING LOOP ILEOSTOMY  PRIMARY REPAIR INCARCERATED UMBILICAL HERNIA   Ileus but stable  Plan:  -aggressive nausea control -PT/OT eval - get him up more -cont ice/tylenol/sitz baths.  Switch to fentanyl for pain -ileostomy training -stop PRN antidiarrheals & iron until better -check CBC -f/u path -improve HTN control - IV meds for now -VTE prophylaxis- SCDs, hepatin -mobilize as tolerated to help recovery  Tyler Diaz, M.D., F.A.C.S. Gastrointestinal and Minimally Invasive [facility] [address], [address] [place], [place] [address] [phone] Main / Paging   12/31/2012   Results:   Labs: Results for orders placed during the hospital encounter of 12/27/12 (from the past 48 hour(s))  HEMOGLOBIN     Status: Abnormal   Collection Time    12/30/12  4:06 AM      Result Value Range   Hemoglobin 8.2 (*) 13.0 - 17.0 g/dL  POTASSIUM     Status: None   Collection  Time    12/30/12  4:06 AM      Result Value Range   Potassium 4.7  3.5 - 5.1 mEq/L  CREATININE, SERUM     Status: Abnormal   Collection Time    12/30/12  4:06 AM      Result Value Range   Creatinine, Ser 1.08  0.50 - 1.35 mg/dL   GFR calc non Af Amer 68 (*) >90 mL/min   GFR calc Af Amer 79 (*) >90 mL/min   Comment:            The eGFR has been calculated     using the CKD EPI  equation.     This calculation has not been     validated in all clinical     situations.     eGFR's persistently     <90 mL/min signify     possible Chronic Kidney Disease.  HEMOGLOBIN     Status: Abnormal   Collection Time    12/31/12  3:50 AM      Result Value Range   Hemoglobin 9.2 (*) 13.0 - 17.0 g/dL  POTASSIUM     Status: None   Collection Time    12/31/12  3:50 AM      Result Value Range   Potassium 4.4  3.5 - 5.1 mEq/L  CREATININE, SERUM     Status: Abnormal   Collection Time    12/31/12  3:50 AM      Result Value Range   Creatinine, Ser 0.99  0.50 - 1.35 mg/dL   GFR calc non Af Amer 82 (*) >90 mL/min   GFR calc Af Amer >90  >90 mL/min   Comment:            The eGFR has been calculated     using the CKD EPI equation.     This calculation has not been     validated in all clinical     situations.     eGFR's persistently     <90 mL/min signify     possible Chronic Kidney Disease.    Imaging / Studies: No results found.  Medications / Allergies: per chart  Antibiotics: Anti-infectives   Start     Dose/Rate Route Frequency Ordered Stop   12/27/12 2000  cefoTEtan (CEFOTAN) 2 g in dextrose 5 % 50 mL IVPB     2 g 100 mL/hr over 30 Minutes Intravenous Every 12 hours 12/27/12 1515 12/27/12 2148   12/27/12 1025  clindamycin (CLEOCIN) 900 mg, gentamicin (GARAMYCIN) 240 mg in sodium chloride 0.9 % 1,000 mL for intraperitoneal lavage  Status:  Discontinued       As needed 12/27/12 1025 12/27/12 1331   12/27/12 0600  cefoTEtan (CEFOTAN) 2 g in dextrose 5 % 50 mL IVPB     2 g 100 mL/hr over 30 Minutes Intravenous On call to O.R. 12/26/12 1902 12/27/12 0800   12/26/12 1915  clindamycin (CLEOCIN) 900 mg, gentamicin (GARAMYCIN) 240 mg in sodium chloride 0.9 % 1,000 mL for intraperitoneal lavage  Status:  Discontinued      Intraperitoneal To Surgery 12/26/12 1902 12/27/12 1452

## 2013-01-01 ENCOUNTER — Telehealth (INDEPENDENT_AMBULATORY_CARE_PROVIDER_SITE_OTHER): Payer: Self-pay

## 2013-01-01 LAB — CBC
MCV: 99.3 fL (ref 78.0–100.0)
Platelets: 288 10*3/uL (ref 150–400)
RBC: 3 MIL/uL — ABNORMAL LOW (ref 4.22–5.81)
WBC: 9.9 10*3/uL (ref 4.0–10.5)

## 2013-01-01 LAB — BASIC METABOLIC PANEL
CO2: 26 mEq/L (ref 19–32)
Chloride: 100 mEq/L (ref 96–112)
Creatinine, Ser: 1.16 mg/dL (ref 0.50–1.35)
Potassium: 3.9 mEq/L (ref 3.5–5.1)

## 2013-01-01 MED ORDER — OXYCODONE HCL 5 MG PO TABS
5.0000 mg | ORAL_TABLET | ORAL | Status: DC | PRN
  Administered 2013-01-01 – 2013-01-02 (×2): 5 mg via ORAL
  Administered 2013-01-02 – 2013-01-03 (×4): 10 mg via ORAL
  Filled 2013-01-01: qty 1
  Filled 2013-01-01 (×4): qty 2
  Filled 2013-01-01: qty 1

## 2013-01-01 MED ORDER — TRAMADOL HCL 50 MG PO TABS
50.0000 mg | ORAL_TABLET | Freq: Four times a day (QID) | ORAL | Status: DC | PRN
  Administered 2013-01-01: 50 mg via ORAL
  Administered 2013-01-02 – 2013-01-03 (×2): 100 mg via ORAL
  Filled 2013-01-01: qty 2
  Filled 2013-01-01 (×3): qty 1

## 2013-01-01 MED ORDER — ENSURE COMPLETE PO LIQD
237.0000 mL | Freq: Three times a day (TID) | ORAL | Status: DC
  Administered 2013-01-01 – 2013-01-03 (×6): 237 mL via ORAL

## 2013-01-01 MED ORDER — LISINOPRIL 5 MG PO TABS
5.0000 mg | ORAL_TABLET | Freq: Every day | ORAL | Status: DC
  Filled 2013-01-01: qty 1

## 2013-01-01 MED ORDER — FAMOTIDINE 20 MG PO TABS
20.0000 mg | ORAL_TABLET | Freq: Every day | ORAL | Status: DC
  Administered 2013-01-01 – 2013-01-03 (×3): 20 mg via ORAL
  Filled 2013-01-01 (×3): qty 1

## 2013-01-01 MED ORDER — MORPHINE SULFATE 2 MG/ML IJ SOLN: 2.0000 mg | INTRAMUSCULAR | Status: DC | PRN

## 2013-01-01 NOTE — Progress Notes (Signed)
Patient assisted to bathroom to empty colostomy pouch.  Noted patients colostomy bag leaking and coming apart from the skin.  Emptied colostomy bag and assisted patient back to bed.  Surrounding area cleaned with warm water and soap.  Right side of bag is a red, skin tear area without drainage noted, cleaned area with water and saline.  Patted dry, applied new wafer and appliance and cut wafer away from red area.  Applied powder to red area.  Ostomy intact and wnl to straight drain.

## 2013-01-01 NOTE — Progress Notes (Signed)
PT Cancellation Note  Patient Details Name: Tyler Diaz MRN: [id number] DOB: 1946-03-06   Cancelled Treatment:    Reason Eval/Treat Not Completed: PT screened, no needs identified, will sign off   Rada Hay 01/01/2013, 4:42 PM

## 2013-01-01 NOTE — Progress Notes (Signed)
Patients penis and scrotum remain swollen.  Patient complains of stream with urination difficult and burning at times.  Patients UOP wnl.  Elevated scrotum and penis on towel, ice pack applied.  Bladder scanned patient to ensure patient not retaining urine.  Bladder scanned showed 87cc urine.  Patient assisted to bathroom to attempt urination.  Patient instructed to call if difficulty and note left for MD to evaluate area this am.  Patient and wife verbalized understanding. Call light within reach.

## 2013-01-01 NOTE — Progress Notes (Signed)
Tyler Diaz [id number] 19-Aug-1945  CARE TEAM:  PCP: Rudi Heap, MD  Outpatient Care Team: Patient Care Team: Ernestina Penna, MD as PCP - General (Family Medicine) Ardeth Sportsman, MD as Consulting Physician (Colon and Rectal Surgery) Shirley Friar, MD as Consulting Physician (Gastroenterology) Michae Kava, MD as Consulting Physician (Medical Oncology) Lance Bosch, MD as Consulting Physician (Radiation Oncology)  Inpatient Treatment Team: Treatment Team: Attending Provider: Ardeth Sportsman, MD; Registered Nurse: Jonathon Bellows, RN; Technician: Lynden Ang, NT; Registered Nurse: Sydell Axon, RN; Technician: Oren Bracket [place], NT; Dietitian: Lavena Bullion, RD; Registered Nurse: Dina Rich, RN; Technician: Burnard Bunting, [place]; Physical Therapist: Rada Hay, PT   Subjective:  Less nauseated Heartburn less but present - uses Pepcid at home Pain controlled but loopy on fentanyl per wife Walked more in hallways Still w significant penile/scrotal edema Wife in room  Objective:  Vital signs:  Filed Vitals:   12/31/12 1621 12/31/12 2136 01/01/13 0619 01/01/13 0622  BP: 121/75 131/73 117/78   Pulse:  102 100   Temp:  97.9 F (36.6 C) 98 F (36.7 C)   TempSrc:  Oral Oral   Resp:  18 18   Height:      Weight:    189 lb 3.2 oz (85.821 kg)  SpO2:  98% 94%     Last BM Date: 12/31/12 (rt side colostomy)  Intake/Output   Yesterday:  07/28 0701 - 07/29 0700 In: 1602.5 [P.O.:120; I.V.:1482.5] Out: 4380 [Urine:1525; Drains:205; Stool:2650] This shift:     Bowel function:  Flatus: In ileostomy bag  BM: >2L succus in bag  Drain: Serous  Physical Exam:  General: Pt awake/alert/oriented x4 in mild distress.  More alert/calm - not toxic Eyes: PERRL, normal EOM.  Sclera clear.  No icterus Neuro: CN II-XII intact w/o focal sensory/motor deficits. Lymph: No head/neck/groin lymphadenopathy Psych:  No  delerium/psychosis/paranoia HENT: Normocephalic, Mucus membranes moist.  No thrush Neck: Supple, No tracheal deviation Chest: No chest wall pain w good excursion CV:  Pulses intact.  Regular rhythm MS: Normal AROM mjr joints.  No obvious deformity Abdomen: Soft.  Much less distended.  Mildly tender at incisions only.  No evidence of peritonitis.  No incarcerated hernias.  Ileostomy less dusky & more pink.  Incisions closed GU: Scrotal edema severe Ext:  SCDs BLE. No edema.  No cyanosis Skin: No petechiae / purpura   Problem List:   Principal Problem:   Rectal cancer - distal ypT3ypN1bM0. Active Problems:   Hypertension   Vertigo   Assessment  Tyler Diaz  67 y.o. male  5 Days Post-Op   LAPAROSCOPIC LOW ANTERIOR RESCECTION WITH COLOANAL ANASTOMOSIS  DIVERTING LOOP ILEOSTOMY  PRIMARY REPAIR INCARCERATED UMBILICAL HERNIA   Ileus resolving  Plan:  -retry PO control -PT/OT eval - get him up more -cont ice/tylenol/sitz baths.  Switch to morphine for pain.  Try tramadol PRN -ileostomy training -allow -I&O for now to allow swelling to decrease.  Follow off IVFs but watch lytes closely -no PRN antidiarrheals & iron until better -check CBC -f/u path -HTN control - IV meds for now.  Hold lisinopril to PRN -VTE prophylaxis- SCDs, hepatin -mobilize as tolerated to help recovery  Ardeth Sportsman, M.D., F.A.C.S. Gastrointestinal and Minimally Invasive Surgery [facility] [address], [address] [place], [place] [address] [phone] Main / Paging   01/01/2013   Results:   Labs: Results for orders placed during the hospital  encounter of 12/27/12 (from the past 48 hour(s))  HEMOGLOBIN     Status: Abnormal   Collection Time    12/31/12  3:50 AM      Result Value Range   Hemoglobin 9.2 (*) 13.0 - 17.0 g/dL  POTASSIUM     Status: None   Collection Time    12/31/12  3:50 AM      Result Value Range   Potassium 4.4  3.5 - 5.1 mEq/L   CREATININE, SERUM     Status: Abnormal   Collection Time    12/31/12  3:50 AM      Result Value Range   Creatinine, Ser 0.99  0.50 - 1.35 mg/dL   GFR calc non Af Amer 82 (*) >90 mL/min   GFR calc Af Amer >90  >90 mL/min   Comment:            The eGFR has been calculated     using the CKD EPI equation.     This calculation has not been     validated in all clinical     situations.     eGFR's persistently     <90 mL/min signify     possible Chronic Kidney Disease.  CBC     Status: Abnormal   Collection Time    12/31/12  8:20 AM      Result Value Range   WBC 11.1 (*) 4.0 - 10.5 K/uL   RBC 2.64 (*) 4.22 - 5.81 MIL/uL   Hemoglobin 8.6 (*) 13.0 - 17.0 g/dL   HCT 19.1 (*) 47.8 - 29.5 %   MCV 100.4 (*) 78.0 - 100.0 fL   MCH 32.6  26.0 - 34.0 pg   MCHC 32.5  30.0 - 36.0 g/dL   RDW 62.1  30.8 - 65.7 %   Platelets 198  150 - 400 K/uL  COMPREHENSIVE METABOLIC PANEL     Status: Abnormal   Collection Time    12/31/12  8:20 AM      Result Value Range   Sodium 134 (*) 135 - 145 mEq/L   Potassium 4.3  3.5 - 5.1 mEq/L   Chloride 101  96 - 112 mEq/L   CO2 25  19 - 32 mEq/L   Glucose, Bld 99  70 - 99 mg/dL   BUN 12  6 - 23 mg/dL   Creatinine, Ser 8.46  0.50 - 1.35 mg/dL   Calcium 9.0  8.4 - 96.2 mg/dL   Total Protein 5.0 (*) 6.0 - 8.3 g/dL   Albumin 2.3 (*) 3.5 - 5.2 g/dL   AST 15  0 - 37 U/L   ALT 11  0 - 53 U/L   Alkaline Phosphatase 44  39 - 117 U/L   Total Bilirubin 0.5  0.3 - 1.2 mg/dL   GFR calc non Af Amer 70 (*) >90 mL/min   GFR calc Af Amer 82 (*) >90 mL/min   Comment:            The eGFR has been calculated     using the CKD EPI equation.     This calculation has not been     validated in all clinical     situations.     eGFR's persistently     <90 mL/min signify     possible Chronic Kidney Disease.  CBC     Status: Abnormal   Collection Time    01/01/13  4:01 AM      Result Value Range  WBC 9.9  4.0 - 10.5 K/uL   RBC 3.00 (*) 4.22 - 5.81 MIL/uL   Hemoglobin  9.8 (*) 13.0 - 17.0 g/dL   HCT 16.1 (*) 09.6 - 04.5 %   MCV 99.3  78.0 - 100.0 fL   MCH 32.7  26.0 - 34.0 pg   MCHC 32.9  30.0 - 36.0 g/dL   RDW 40.9  81.1 - 91.4 %   Platelets 288  150 - 400 K/uL   Comment: RESULT REPEATED AND VERIFIED     DELTA CHECK NOTED  BASIC METABOLIC PANEL     Status: Abnormal   Collection Time    01/01/13  4:01 AM      Result Value Range   Sodium 137  135 - 145 mEq/L   Potassium 3.9  3.5 - 5.1 mEq/L   Chloride 100  96 - 112 mEq/L   CO2 26  19 - 32 mEq/L   Glucose, Bld 109 (*) 70 - 99 mg/dL   BUN 11  6 - 23 mg/dL   Creatinine, Ser 7.82  0.50 - 1.35 mg/dL   Calcium 9.5  8.4 - 95.6 mg/dL   GFR calc non Af Amer 62 (*) >90 mL/min   GFR calc Af Amer 72 (*) >90 mL/min   Comment:            The eGFR has been calculated     using the CKD EPI equation.     This calculation has not been     validated in all clinical     situations.     eGFR's persistently     <90 mL/min signify     possible Chronic Kidney Disease.    Imaging / Studies: No results found.  Medications / Allergies: per chart  Antibiotics: Anti-infectives   Start     Dose/Rate Route Frequency Ordered Stop   12/27/12 2000  cefoTEtan (CEFOTAN) 2 g in dextrose 5 % 50 mL IVPB     2 g 100 mL/hr over 30 Minutes Intravenous Every 12 hours 12/27/12 1515 12/27/12 2148   12/27/12 1025  clindamycin (CLEOCIN) 900 mg, gentamicin (GARAMYCIN) 240 mg in sodium chloride 0.9 % 1,000 mL for intraperitoneal lavage  Status:  Discontinued       As needed 12/27/12 1025 12/27/12 1331   12/27/12 0600  cefoTEtan (CEFOTAN) 2 g in dextrose 5 % 50 mL IVPB     2 g 100 mL/hr over 30 Minutes Intravenous On call to O.R. 12/26/12 1902 12/27/12 0800   12/26/12 1915  clindamycin (CLEOCIN) 900 mg, gentamicin (GARAMYCIN) 240 mg in sodium chloride 0.9 % 1,000 mL for intraperitoneal lavage  Status:  Discontinued      Intraperitoneal To Surgery 12/26/12 1902 12/27/12 1452

## 2013-01-01 NOTE — Telephone Encounter (Signed)
Called to add pt to GI cancer conference for 01/02/13 per Dr Michaell Cowing request.

## 2013-01-02 MED ORDER — LISINOPRIL 10 MG PO TABS: 10.0000 mg | ORAL_TABLET | Freq: Every day | ORAL | Status: DC

## 2013-01-02 MED ORDER — LOPERAMIDE HCL 2 MG PO CAPS: 2.0000 mg | ORAL_CAPSULE | Freq: Three times a day (TID) | ORAL | Status: DC | PRN

## 2013-01-02 MED ORDER — LISINOPRIL 20 MG PO TABS: 20.0000 mg | ORAL_TABLET | Freq: Two times a day (BID) | ORAL | Status: DC

## 2013-01-02 MED ORDER — MORPHINE SULFATE 2 MG/ML IJ SOLN: 2.0000 mg | INTRAMUSCULAR | Status: DC | PRN

## 2013-01-02 MED ORDER — FERROUS SULFATE 325 (65 FE) MG PO TABS: 325.0000 mg | ORAL_TABLET | Freq: Two times a day (BID) | ORAL | Status: DC

## 2013-01-02 MED ORDER — LOPERAMIDE HCL 2 MG PO CAPS
2.0000 mg | ORAL_CAPSULE | Freq: Every day | ORAL | Status: DC
  Administered 2013-01-02: 2 mg via ORAL
  Filled 2013-01-02 (×2): qty 1

## 2013-01-02 MED ORDER — LISINOPRIL 10 MG PO TABS
10.0000 mg | ORAL_TABLET | Freq: Every day | ORAL | Status: DC
  Administered 2013-01-02 – 2013-01-03 (×2): 10 mg via ORAL
  Filled 2013-01-02 (×2): qty 1

## 2013-01-02 MED ORDER — FERROUS SULFATE 325 (65 FE) MG PO TABS
325.0000 mg | ORAL_TABLET | Freq: Every day | ORAL | Status: DC
  Administered 2013-01-02 – 2013-01-03 (×2): 325 mg via ORAL
  Filled 2013-01-02 (×3): qty 1

## 2013-01-02 NOTE — Progress Notes (Signed)
Tyler Diaz [id number] 1945-06-27  CARE TEAM:  PCP: Rudi Heap, MD  Outpatient Care Team: Patient Care Team: Ernestina Penna, MD as PCP - General (Family Medicine) Ardeth Sportsman, MD as Consulting Physician (Colon and Rectal Surgery) Shirley Friar, MD as Consulting Physician (Gastroenterology) Michae Kava, MD as Consulting Physician (Medical Oncology) Lance Bosch, MD as Consulting Physician (Radiation Oncology)  Inpatient Treatment Team: Treatment Team: Attending Provider: Ardeth Sportsman, MD; Technician: Lynden Ang, NT; Registered Nurse: Sydell Axon, RN; Technician: Oren Bracket [place], NT; Dietitian: Lavena Bullion, RD; Registered Nurse: Dina Rich, RN   Subjective:  No nausea but appetite poor Walked more in hallways Much less penile/scrotal edema Wife in room  Objective:  Vital signs:  Filed Vitals:   01/01/13 0622 01/01/13 1459 01/01/13 2244 01/02/13 0620  BP:  120/58 119/51 125/75  Pulse:  93 90 98  Temp:  99.5 F (37.5 C) 99 F (37.2 C) 98.1 F (36.7 C)  TempSrc:  Oral Oral Oral  Resp:  16 16 16   Height:      Weight: 189 lb 3.2 oz (85.821 kg)     SpO2:  98% 96% 96%    Last BM Date: 01/01/13  Intake/Output   Yesterday:  07/29 0701 - 07/30 0700 In: 1176.3 [P.O.:960; I.V.:216.3] Out: 1065 [Drains:165; Stool:900] This shift:  Total I/O In: 480 [P.O.:480] Out: 375 [Drains:25; Stool:350]  Bowel function:  Flatus: In ileostomy bag  BM: 1.2L succus yest.  syrupy  Drain: Serous  Physical Exam:  General: Pt awake/alert/oriented x4 in mild distress.  Alert, talkative Eyes: PERRL, normal EOM.  Sclera clear.  No icterus Neuro: CN II-XII intact w/o focal sensory/motor deficits. Lymph: No head/neck/groin lymphadenopathy Psych:  No delerium/psychosis/paranoia HENT: Normocephalic, Mucus membranes moist.  No thrush Neck: Supple, No tracheal deviation Chest: No chest wall pain w good excursion CV:  Pulses  intact.  Regular rhythm MS: Normal AROM mjr joints.  No obvious deformity Abdomen: Soft.  Much less distended.  Mildly tender at incisions only.  No evidence of peritonitis.  No incarcerated hernias.  Ileostomy pink.  Incisions closed GU: Scrotal edema milder - much improved Ext:  SCDs BLE. No edema.  No cyanosis Skin: No petechiae / purpura   Problem List:   Principal Problem:   Rectal cancer - distal ypT3ypN1bM0. Active Problems:   Hypertension   Vertigo   Assessment  Tyler Diaz  67 y.o. male  6 Days Post-Op   LAPAROSCOPIC LOW ANTERIOR RESCECTION WITH COLOANAL ANASTOMOSIS  DIVERTING LOOP ILEOSTOMY  PRIMARY REPAIR INCARCERATED UMBILICAL HERNIA   Ileus resolving  Plan:  -Adv diet -PT/OT eval - get him up more -cont ice/tylenol/sitz baths.  Tramadol/Morphine for pain.  -ileostomy training -allow -I&O for now to allow swelling to decrease.  Follow off IVFs but watch lytes closely.  I think he would benefit from home IV crystalloid infusions three times a week to avoid severe dehydration/renal failure/readmission.  I will see if this can be set up at home.  Has not had luck with the cancer center doing that. -slowly restart PRN antidiarrheals & iron until better -path with negative margins -HTN control 1/2 dose lisinopril for now -VTE prophylaxis- SCDs, hepatin -mobilize as tolerated to help recovery  D/C patient from hospital when patient meets criteria (anticipate in 1-2 day(s)):  Tolerating oral intake well Ambulating in walkways Adequate pain control without IV medications Urinating  Having flatus Ostomy training coordinated   Ardeth Sportsman, M.D., F.A.C.S.  Gastrointestinal and Minimally Invasive Surgery [facility] [address], [address] [place], [place] [address] [phone] Main / Paging   01/02/2013   Results:   Labs: Results for orders placed during the hospital encounter of 12/27/12 (from the past 48 hour(s))   CBC     Status: Abnormal   Collection Time    12/31/12  8:20 AM      Result Value Range   WBC 11.1 (*) 4.0 - 10.5 K/uL   RBC 2.64 (*) 4.22 - 5.81 MIL/uL   Hemoglobin 8.6 (*) 13.0 - 17.0 g/dL   HCT 82.9 (*) 56.2 - 13.0 %   MCV 100.4 (*) 78.0 - 100.0 fL   MCH 32.6  26.0 - 34.0 pg   MCHC 32.5  30.0 - 36.0 g/dL   RDW 86.5  78.4 - 69.6 %   Platelets 198  150 - 400 K/uL  COMPREHENSIVE METABOLIC PANEL     Status: Abnormal   Collection Time    12/31/12  8:20 AM      Result Value Range   Sodium 134 (*) 135 - 145 mEq/L   Potassium 4.3  3.5 - 5.1 mEq/L   Chloride 101  96 - 112 mEq/L   CO2 25  19 - 32 mEq/L   Glucose, Bld 99  70 - 99 mg/dL   BUN 12  6 - 23 mg/dL   Creatinine, Ser 2.95  0.50 - 1.35 mg/dL   Calcium 9.0  8.4 - 28.4 mg/dL   Total Protein 5.0 (*) 6.0 - 8.3 g/dL   Albumin 2.3 (*) 3.5 - 5.2 g/dL   AST 15  0 - 37 U/L   ALT 11  0 - 53 U/L   Alkaline Phosphatase 44  39 - 117 U/L   Total Bilirubin 0.5  0.3 - 1.2 mg/dL   GFR calc non Af Amer 70 (*) >90 mL/min   GFR calc Af Amer 82 (*) >90 mL/min   Comment:            The eGFR has been calculated     using the CKD EPI equation.     This calculation has not been     validated in all clinical     situations.     eGFR's persistently     <90 mL/min signify     possible Chronic Kidney Disease.  CBC     Status: Abnormal   Collection Time    01/01/13  4:01 AM      Result Value Range   WBC 9.9  4.0 - 10.5 K/uL   RBC 3.00 (*) 4.22 - 5.81 MIL/uL   Hemoglobin 9.8 (*) 13.0 - 17.0 g/dL   HCT 13.2 (*) 44.0 - 10.2 %   MCV 99.3  78.0 - 100.0 fL   MCH 32.7  26.0 - 34.0 pg   MCHC 32.9  30.0 - 36.0 g/dL   RDW 72.5  36.6 - 44.0 %   Platelets 288  150 - 400 K/uL   Comment: RESULT REPEATED AND VERIFIED     DELTA CHECK NOTED  BASIC METABOLIC PANEL     Status: Abnormal   Collection Time    01/01/13  4:01 AM      Result Value Range   Sodium 137  135 - 145 mEq/L   Potassium 3.9  3.5 - 5.1 mEq/L   Chloride 100  96 - 112 mEq/L   CO2 26   19 - 32 mEq/L   Glucose, Bld  109 (*) 70 - 99 mg/dL   BUN 11  6 - 23 mg/dL   Creatinine, Ser 6.29  0.50 - 1.35 mg/dL   Calcium 9.5  8.4 - 52.8 mg/dL   GFR calc non Af Amer 62 (*) >90 mL/min   GFR calc Af Amer 72 (*) >90 mL/min   Comment:            The eGFR has been calculated     using the CKD EPI equation.     This calculation has not been     validated in all clinical     situations.     eGFR's persistently     <90 mL/min signify     possible Chronic Kidney Disease.  POTASSIUM     Status: None   Collection Time    01/02/13  3:37 AM      Result Value Range   Potassium 3.9  3.5 - 5.1 mEq/L  CREATININE, SERUM     Status: Abnormal   Collection Time    01/02/13  3:37 AM      Result Value Range   Creatinine, Ser 1.17  0.50 - 1.35 mg/dL   GFR calc non Af Amer 62 (*) >90 mL/min   GFR calc Af Amer 72 (*) >90 mL/min   Comment:            The eGFR has been calculated     using the CKD EPI equation.     This calculation has not been     validated in all clinical     situations.     eGFR's persistently     <90 mL/min signify     possible Chronic Kidney Disease.    Imaging / Studies: No results found.  Medications / Allergies: per chart  Antibiotics: Anti-infectives   Start     Dose/Rate Route Frequency Ordered Stop   12/27/12 2000  cefoTEtan (CEFOTAN) 2 g in dextrose 5 % 50 mL IVPB     2 g 100 mL/hr over 30 Minutes Intravenous Every 12 hours 12/27/12 1515 12/27/12 2148   12/27/12 1025  clindamycin (CLEOCIN) 900 mg, gentamicin (GARAMYCIN) 240 mg in sodium chloride 0.9 % 1,000 mL for intraperitoneal lavage  Status:  Discontinued       As needed 12/27/12 1025 12/27/12 1331   12/27/12 0600  cefoTEtan (CEFOTAN) 2 g in dextrose 5 % 50 mL IVPB     2 g 100 mL/hr over 30 Minutes Intravenous On call to O.R. 12/26/12 1902 12/27/12 0800   12/26/12 1915  clindamycin (CLEOCIN) 900 mg, gentamicin (GARAMYCIN) 240 mg in sodium chloride 0.9 % 1,000 mL for intraperitoneal lavage  Status:   Discontinued      Intraperitoneal To Surgery 12/26/12 1902 12/27/12 1452

## 2013-01-02 NOTE — Progress Notes (Signed)
[facility]  Patient Status:   New pt to [facility] services.  [facility] is providing the following services:   Pt will have HH RN and [facility] services for IV hydration at home (LR 1000 ml IV Q Mon, Wed, Fri) starting 01/04/13.  [facility] hospital team will follow pt and support DC to home.  If patient discharges after hours, please call [phone].   Sedalia Muta 01/02/2013, 1:49 PM

## 2013-01-02 NOTE — Consult Note (Signed)
WOC ostomy consult: Followup and discharge teaching Stoma type/location: RUQ ileostomy Stomal assessment/size: 1 and 1/2 inches, moist, dark red, round. Functioning os at 12 o'clock Peristomal assessment: intact, clear in the immediate peristomal area.  Previously noted blister is reepithelialized, but fragile. Treatment options for stomal/peristomal skin: old crusted material removed.  Light dusting of pectin powder applied ad topped with a no-sting barrier film. Output soft brown stool.  Thickening as patient's diet increases. Ostomy pouching: 2pc. 2 and 1/4 inch system applied.  Demonstrated removal, sizing and tracing of stom pattern onto back to skin barrier.  Wife observing how to cut skin barrier.  Wife and younger son observing how to place pouching system.  A spare pouching system is provided ready for use as well as several supplies to get started. Education provided: Diet tips reviewed.  Emptying routine reviewed, patient is independent in this skill.  Pouch change schedule reviewed and it is advised that next pouch change occur in the presence of the [facility], if possible.  Patient is ready for discharge to his home and the care of competent caregivers under the supervision of his surgeon. Thanks, Ladona Mow, MSN, RN, [facility], CWOCN [phone])

## 2013-01-03 DIAGNOSIS — K420 Umbilical hernia with obstruction, without gangrene: Secondary | ICD-10-CM

## 2013-01-03 DIAGNOSIS — K4020 Bilateral inguinal hernia, without obstruction or gangrene, not specified as recurrent: Secondary | ICD-10-CM

## 2013-01-03 HISTORY — DX: Umbilical hernia with obstruction, without gangrene: K42.0

## 2013-01-03 LAB — CREATININE, SERUM: GFR calc non Af Amer: 64 mL/min — ABNORMAL LOW (ref >90)

## 2013-01-03 MED ORDER — LISINOPRIL 20 MG PO TABS: 20.0000 mg | ORAL_TABLET | Freq: Every day | ORAL | Status: DC

## 2013-01-03 MED ORDER — SODIUM CHLORIDE 0.9 % IJ SOLN
10.0000 mL | INTRAMUSCULAR | Status: DC | PRN
  Administered 2013-01-03: 10 mL

## 2013-01-03 MED ORDER — TRAMADOL HCL 50 MG PO TABS: 50.0000 mg | ORAL_TABLET | Freq: Four times a day (QID) | ORAL | Status: DC | PRN

## 2013-01-03 MED ORDER — HEPARIN SOD (PORK) LOCK FLUSH 100 UNIT/ML IV SOLN
250.0000 [IU] | INTRAVENOUS | Status: AC | PRN
  Administered 2013-01-03: 500 [IU]

## 2013-01-03 MED ORDER — OXYCODONE HCL 5 MG PO TABS: 5.0000 mg | ORAL_TABLET | ORAL | Status: DC | PRN

## 2013-01-03 NOTE — Progress Notes (Signed)
Peripherally Inserted Central Catheter/Midline Placement  The IV Nurse has discussed with the patient and/or persons authorized to consent for the patient, the purpose of this procedure and the potential benefits and risks involved with this procedure.  The benefits include less needle sticks, lab draws from the catheter and patient may be discharged home with the catheter.  Risks include, but not limited to, infection, bleeding, blood clot (thrombus formation), and puncture of an artery; nerve damage and irregular heat beat.  Alternatives to this procedure were also discussed.  PICC/Midline Placement Documentation        Tyler Diaz 01/03/2013, 12:50 PM

## 2013-01-03 NOTE — Discharge Summary (Signed)
Physician Discharge Summary  Patient ID: Tyler Diaz MRN: [id number] DOB/AGE: [birth year] 67 y.o.  Admit date: 12/27/2012 Discharge date: 01/03/2013  Admission Diagnoses: Principal Problem:   Rectal cancer - distal  Active Problems:   Hypertension   Vertigo   Umbilical hernia, incarcerated    Bilateral inguinal hernia (BIH)  Discharge Diagnoses:  Principal Problem:   Rectal cancer - distal s/p lap LAR/coloanal July 2014 - [id number]. Active Problems:   Hypertension   Vertigo   Umbilical hernia, incarcerated - s/p primary repair Jult 2014   Bilateral inguinal hernia (BIH)   Discharged Condition: good  Hospital Course: Pleasant gentleman it was bulky distal rectal cancer.  Underwent neoadjuvant chemotherapy radiation therapy.  Underwent surgical resection the day of surgery.  Postoperatively, the patient was placed on an anti-ileus protocol.  Initially bowel opened up but then he developed an ileus a few days later.  Rehydrated.  Ileus resolved.The patient mobilized and advanced to a solid diet gradually.  Pain was well-controlled and transitioned off IV medications.    By the time of discharge, the patient was walking well the hallways, eating food well, having flatus.  Pain was-controlled on an oral regimen.  He received ileostomy care training.  Home health has been set up for IV fluids three times a week to avoid dehydration as well as ostomy care/training.  Based on meeting DC criteria and recovering well, I felt it was safe for the patient to be discharged home with close followup.  Instructions were discussed in detail.  They are written as well.     Consults: wound ostomy care nursing  Significant Diagnostic Studies:   Results for orders placed during the hospital encounter of 12/27/12 (from the past 72 hour(s))  CBC     Status: Abnormal   Collection Time    12/31/12  8:20 AM      Result Value Range   WBC 11.1 (*) 4.0 - 10.5 K/uL   RBC 2.64 (*) 4.22 - 5.81 MIL/uL    Hemoglobin 8.6 (*) 13.0 - 17.0 g/dL   HCT 65.7 (*) 84.6 - 96.2 %   MCV 100.4 (*) 78.0 - 100.0 fL   MCH 32.6  26.0 - 34.0 pg   MCHC 32.5  30.0 - 36.0 g/dL   RDW 95.2  84.1 - 32.4 %   Platelets 198  150 - 400 K/uL  COMPREHENSIVE METABOLIC PANEL     Status: Abnormal   Collection Time    12/31/12  8:20 AM      Result Value Range   Sodium 134 (*) 135 - 145 mEq/L   Potassium 4.3  3.5 - 5.1 mEq/L   Chloride 101  96 - 112 mEq/L   CO2 25  19 - 32 mEq/L   Glucose, Bld 99  70 - 99 mg/dL   BUN 12  6 - 23 mg/dL   Creatinine, Ser 4.01  0.50 - 1.35 mg/dL   Calcium 9.0  8.4 - 02.7 mg/dL   Total Protein 5.0 (*) 6.0 - 8.3 g/dL   Albumin 2.3 (*) 3.5 - 5.2 g/dL   AST 15  0 - 37 U/L   ALT 11  0 - 53 U/L   Alkaline Phosphatase 44  39 - 117 U/L   Total Bilirubin 0.5  0.3 - 1.2 mg/dL   GFR calc non Af Amer 70 (*) >90 mL/min   GFR calc Af Amer 82 (*) >90 mL/min   Comment:  The eGFR has been calculated     using the CKD EPI equation.     This calculation has not been     validated in all clinical     situations.     eGFR's persistently     <90 mL/min signify     possible Chronic Kidney Disease.  CBC     Status: Abnormal   Collection Time    01/01/13  4:01 AM      Result Value Range   WBC 9.9  4.0 - 10.5 K/uL   RBC 3.00 (*) 4.22 - 5.81 MIL/uL   Hemoglobin 9.8 (*) 13.0 - 17.0 g/dL   HCT 16.1 (*) 09.6 - 04.5 %   MCV 99.3  78.0 - 100.0 fL   MCH 32.7  26.0 - 34.0 pg   MCHC 32.9  30.0 - 36.0 g/dL   RDW 40.9  81.1 - 91.4 %   Platelets 288  150 - 400 K/uL   Comment: RESULT REPEATED AND VERIFIED     DELTA CHECK NOTED  BASIC METABOLIC PANEL     Status: Abnormal   Collection Time    01/01/13  4:01 AM      Result Value Range   Sodium 137  135 - 145 mEq/L   Potassium 3.9  3.5 - 5.1 mEq/L   Chloride 100  96 - 112 mEq/L   CO2 26  19 - 32 mEq/L   Glucose, Bld 109 (*) 70 - 99 mg/dL   BUN 11  6 - 23 mg/dL   Creatinine, Ser 7.82  0.50 - 1.35 mg/dL   Calcium 9.5  8.4 - 95.6 mg/dL   GFR  calc non Af Amer 62 (*) >90 mL/min   GFR calc Af Amer 72 (*) >90 mL/min   Comment:            The eGFR has been calculated     using the CKD EPI equation.     This calculation has not been     validated in all clinical     situations.     eGFR's persistently     <90 mL/min signify     possible Chronic Kidney Disease.  POTASSIUM     Status: None   Collection Time    01/02/13  3:37 AM      Result Value Range   Potassium 3.9  3.5 - 5.1 mEq/L  CREATININE, SERUM     Status: Abnormal   Collection Time    01/02/13  3:37 AM      Result Value Range   Creatinine, Ser 1.17  0.50 - 1.35 mg/dL   GFR calc non Af Amer 62 (*) >90 mL/min   GFR calc Af Amer 72 (*) >90 mL/min   Comment:            The eGFR has been calculated     using the CKD EPI equation.     This calculation has not been     validated in all clinical     situations.     eGFR's persistently     <90 mL/min signify     possible Chronic Kidney Disease.  POTASSIUM     Status: None   Collection Time    01/03/13  3:43 AM      Result Value Range   Potassium 4.3  3.5 - 5.1 mEq/L  CREATININE, SERUM     Status: Abnormal   Collection Time    01/03/13  3:43 AM  Result Value Range   Creatinine, Ser 1.13  0.50 - 1.35 mg/dL   GFR calc non Af Amer 64 (*) >90 mL/min   GFR calc Af Amer 75 (*) >90 mL/min   Comment:            The eGFR has been calculated     using the CKD EPI equation.     This calculation has not been     validated in all clinical     situations.     eGFR's persistently     <90 mL/min signify     possible Chronic Kidney Disease.     Treatments:   POST-OPERATIVE DIAGNOSIS: very distal rectal cancer  Umbilical incarcerated hernia  LIH - incarcerated  RIH  PROCEDURE: Procedure(s):  LAPAROSCOPIC LOW ANTERIOR RESECTION,  COLO-ANAL ANASTOMOSIS,  DIVERTING LOOP ILEOSTOMY,  SPLENIC LEXURE MOBILIZATION,  PRIMARY INCARCERATED UMBILICAL HERNIA REPAIR  SURGEON: Surgeon(s):  Ardeth Sportsman, MD  Romie Levee, MD - Asst       Discharge Exam: Blood pressure 143/68, pulse 104, temperature 98 F (36.7 C), temperature source Oral, resp. rate 18, height 5\' 10"  (1.778 m), weight 189 lb 3.2 oz (85.821 kg), SpO2 99.00%.  General: Pt awake/alert/oriented x4 in mild distress. Alert, talkative  Eyes: PERRL, normal EOM. Sclera clear. No icterus  Neuro: CN II-XII intact w/o focal sensory/motor deficits.  Lymph: No head/neck/groin lymphadenopathy  Psych: No delerium/psychosis/paranoia  HENT: Normocephalic, Mucus membranes moist. No thrush  Neck: Supple, No tracheal deviation  Chest: No chest wall pain w good excursion  CV: Pulses intact. Regular rhythm  MS: Normal AROM mjr joints. No obvious deformity  Abdomen: Soft. Nondistended. Mildly tender at incisions only. No evidence of peritonitis. No incarcerated hernias. Ileostomy pink. Incisions closed  GU: Scrotal edema mild - much improved  Ext: SCDs BLE. No edema. No cyanosis  Skin: No petechiae / purpura      Disposition: Final discharge disposition not confirmed  Discharge Orders   Future Appointments Provider Department Dept Phone   01/14/2013 10:45 AM Ardeth Sportsman, MD [facility], [place] [phone]   Future Orders Complete By Expires     Call MD for:  extreme fatigue  As directed     Call MD for:  hives  As directed     Call MD for:  persistant nausea and vomiting  As directed     Call MD for:  redness, tenderness, or signs of infection (pain, swelling, redness, odor or green/yellow discharge around incision site)  As directed     Call MD for:  severe uncontrolled pain  As directed     Call MD for:  As directed     Comments:      Temperature > 101.18F    Diet - low sodium heart healthy  As directed     Discharge instructions  As directed     Comments:      Please see discharge instruction sheets.  Also refer to handout given an office.  Please call our office if you have any questions or concerns [phone]     Discharge wound care:  As directed     Comments:      If you have closed incisions, shower and bathe over these incisions with soap and water every day.  Remove all surgical dressings on postoperative day #3.  You do not need to replace dressings over the closed incisions unless you feel more comfortable with a Band-Aid covering it.   If you  have an open wound that requires packing, please see wound care instructions.  In general, remove all dressings, wash wound with soap and water and then replace with saline moistened gauze.  Do the dressing change at least every day.  Please call our office [phone] if you have further questions.    Driving Restrictions  As directed     Comments:      No driving until off narcotics and can safely swerve away without pain during an emergency    Increase activity slowly  As directed     Comments:      Walk an hour a day.  Use 20-30 minute walks.  When you can walk 30 minutes without difficulty, increase to low impact/moderate activities such as biking, jogging, swimming, sexual activity..  Eventually can increase to unrestricted activity when not feeling pain.  If you feel pain: STOP!Marland Kitchen   Let pain protect you from overdoing it.  Use ice/heat/over-the-counter pain medications to help minimize his soreness.  Use pain prescriptions as needed to remain active.  It is better to take extra pain medications and be more active than to stay bedridden to avoid all pain medications.    Lifting restrictions  As directed     Comments:      Avoid heavy lifting initially.  Do not push through pain.  You have no specific weight limit.  Coughing and sneezing or four more stressful to your incision than any lifting you will do. Pain will protect you from injury.  Therefore, avoid intense activity until off all narcotic pain medications.  Coughing and sneezing or four more stressful to your incision than any lifting he will do.    May shower / Bathe  As directed     May walk up  steps  As directed     Sexual Activity Restrictions  As directed     Comments:      Sexual activity as tolerated.  Do not push through pain.  Pain will protect you from injury.    Walk with assistance  As directed     Comments:      Walk over an hour a day.  May use a walker/cane/companion to help with balance and stamina.        Medication List    STOP taking these medications       hydrochlorothiazide 25 MG tablet  Commonly known as:  HYDRODIURIL     polyethylene glycol powder powder  Commonly known as:  GLYCOLAX/MIRALAX      TAKE these medications       acetaminophen 500 MG tablet  Commonly known as:  TYLENOL  Take 500 mg by mouth every 6 (six) hours as needed for pain.     acetaminophen-codeine 300-30 MG per tablet  Commonly known as:  TYLENOL #3  Take 1 tablet by mouth every 6 (six) hours as needed.     ferrous sulfate 325 (65 FE) MG tablet  Take 1 tablet (325 mg total) by mouth 2 (two) times daily at 8 am and 10 pm.     hydrocortisone 2.5 % rectal cream  Commonly known as:  ANUSOL-HC  Place 1 application rectally 3 (three) times daily as needed for hemorrhoids.     lisinopril 20 MG tablet  Commonly known as:  PRINIVIL,ZESTRIL  Take 1 tablet (20 mg total) by mouth daily.     loperamide 2 MG capsule  Commonly known as:  IMODIUM  Take 1-2 capsules (2-4 mg total) by mouth every  8 (eight) hours as needed for diarrhea or loose stools (Use if >2 BM every 8 hours).     multivitamin with minerals Tabs  Take 1 tablet by mouth daily.     pravastatin 20 MG tablet  Commonly known as:  PRAVACHOL  Take 20 mg by mouth every morning.     promethazine 25 MG tablet  Commonly known as:  PHENERGAN  Take 25 mg by mouth every 6 (six) hours as needed for nausea.     traMADol 50 MG tablet  Commonly known as:  ULTRAM  Take 1 tablet (50 mg total) by mouth every 6 (six) hours as needed for pain.           Follow-up Information   Follow up with Laverne Hursey C., MD. Schedule  an appointment as soon as possible for a visit in 2 weeks.   Contact information:   [address] [place] [address] [phone]       Signed: Ardeth Sportsman. 01/03/2013, 7:50 AM

## 2013-01-03 NOTE — Progress Notes (Signed)
Thanks for the help.

## 2013-01-07 ENCOUNTER — Other Ambulatory Visit: Payer: Self-pay | Admitting: *Deleted

## 2013-01-07 ENCOUNTER — Telehealth (INDEPENDENT_AMBULATORY_CARE_PROVIDER_SITE_OTHER): Payer: Self-pay | Admitting: General Surgery

## 2013-01-07 NOTE — Telephone Encounter (Signed)
Spoke with pt's wife to see how her husband is doing.  She explained that he has good and bad days but overall he is doing well.  She explained he was hooked up to IV today and is still urinating.  They are also giving him ensure, boost, and gatorade to keep him well hydrated.  I informed her to call me if she sees that anything is changing.  She explained she would.  I also reminded her of their appt on 01/14/13 w/ Dr. Michaell Cowing.

## 2013-01-08 ENCOUNTER — Telehealth: Payer: Self-pay | Admitting: *Deleted

## 2013-01-08 ENCOUNTER — Telehealth: Payer: Self-pay | Admitting: Oncology

## 2013-01-08 NOTE — Telephone Encounter (Signed)
Added appt for 8/15 @ 3:30pm per 8/4 pof. Not able to reach pt or lm. Schedule mailed [facility] informed.

## 2013-01-08 NOTE — Telephone Encounter (Signed)
Spoke with patient and his wife by phone.  They stated things are going pretty well today.  He is eating and drinking and feeling stronger each day.  Patient would like to see Dr. Truett Perna for next steps regarding treatment.  He is aware of appointments with Dr. Truett Perna and Dr. Michaell Cowing.  He has the phone numbers of both offices and verbalized understanding to call for any problems.

## 2013-01-08 NOTE — Telephone Encounter (Signed)
Confirmed appointment with Dr. Truett Perna for 01/18/13.

## 2013-01-14 ENCOUNTER — Ambulatory Visit (INDEPENDENT_AMBULATORY_CARE_PROVIDER_SITE_OTHER): Payer: Medicare Other | Admitting: Surgery

## 2013-01-14 ENCOUNTER — Encounter (INDEPENDENT_AMBULATORY_CARE_PROVIDER_SITE_OTHER): Payer: Self-pay | Admitting: Surgery

## 2013-01-14 VITALS — BP 102/60 | HR 104 | Resp 14 | Ht 70.0 in | Wt 181.2 lb

## 2013-01-14 DIAGNOSIS — Z932 Ileostomy status: Secondary | ICD-10-CM

## 2013-01-14 DIAGNOSIS — D630 Anemia in neoplastic disease: Secondary | ICD-10-CM

## 2013-01-14 DIAGNOSIS — K420 Umbilical hernia with obstruction, without gangrene: Secondary | ICD-10-CM

## 2013-01-14 DIAGNOSIS — I10 Essential (primary) hypertension: Secondary | ICD-10-CM

## 2013-01-14 DIAGNOSIS — C20 Malignant neoplasm of rectum: Secondary | ICD-10-CM

## 2013-01-14 HISTORY — DX: Ileostomy status: Z93.2

## 2013-01-14 LAB — CBC
HCT: 30.3 % — ABNORMAL LOW (ref 39.0–52.0)
Hemoglobin: 10.1 g/dL — ABNORMAL LOW (ref 13.0–17.0)
MCH: 31.8 pg (ref 26.0–34.0)
MCHC: 33.3 g/dL (ref 30.0–36.0)
MCV: 95.3 fL (ref 78.0–100.0)
RDW: 14.6 % (ref 11.5–15.5)

## 2013-01-14 LAB — BASIC METABOLIC PANEL
BUN: 21 mg/dL (ref 6–23)
Creat: 1.13 mg/dL (ref 0.50–1.35)

## 2013-01-14 MED ORDER — HYDROCORTISONE 2.5 % RE CREA: TOPICAL_CREAM | Freq: Two times a day (BID) | RECTAL | Status: DC

## 2013-01-14 NOTE — Progress Notes (Signed)
Thanks for the help.

## 2013-01-14 NOTE — Progress Notes (Signed)
Subjective:     Patient ID: Tyler Diaz, male   DOB: 04/25/1946, 67 y.o.   MRN: [id number]  HPI  Tyler Diaz  Oct 27, 1945 [id number]  Patient Care Team: Ernestina Penna, MD as PCP - General (Family Medicine) Ardeth Sportsman, MD as Consulting Physician (Colon and Rectal Surgery) Shirley Friar, MD as Consulting Physician (Gastroenterology) Michae Kava, MD as Consulting Physician (Medical Oncology) Lance Bosch, MD as Consulting Physician (Radiation Oncology) Cira Rue, RN as Registered Nurse (Oncology)  This patient is a 67 y.o.male who presents today for surgical evaluation DOS: 12/27/2012   POST-OPERATIVE DIAGNOSIS: very distal rectal cancer  Umbilical incarcerated hernia  LIH - incarcerated  RIH  PROCEDURE: Procedure(s):  LAPAROSCOPIC LOW ANTERIOR RESECTION,  COLO-ANAL ANASTOMOSIS,  DIVERTING LOOP ILEOSTOMY,  SPLENIC LEXURE MOBILIZATION,  PRIMARY INCARCERATED UMBILICAL HERNIA REPAIR  SURGEON: Surgeon(s):  Ardeth Sportsman, MD  Romie Levee, MD - Asst   The patient comes in today feeling stronger.  Appetite better.  Emptying bag six times a day.  Consistency usually like "cow manure".  No severe diarrhea.   Using Ensure.  Still getting IV fluid infusion three times a week.  Urinating fine.  No lightheadedness or dizziness.  A couple episodes of weeks on the seal of the ileostomy but otherwise rather tolerable.  Energy level coming back.  He comes today with his wife and son.  Still has perianal soreness but that is improved as well.  No bleeding.  No fevers or chills.  No nausea or vomiting.  Patient Active Problem List   Diagnosis Date Noted  . Ileostomy - diverting loop - in place [id number] 01/14/2013  . Anemia in neoplastic disease 01/14/2013  . Umbilical hernia, incarcerated - s/p primary repair Jult 2014 01/03/2013  . Bilateral inguinal hernia (BIH) 01/03/2013  . Mitral regurgitation due to partially flail posterior mitral valve leaflet 07/30/2012  .  Hyperlipidemia   . Rectal cancer - distal s/p lap LAR/coloanal July 2014 - [id number]. 07/27/2012  . Hypertension   . Vertigo     Past Medical History  Diagnosis Date  . Mitral regurgitation due to partially flail posterior mitral valve leaflet   . Rectal cancer   . Hyperlipidemia   . Colon cancer 07/18/12 bx    rectum=Invasive adenocarcinoma w/ extracellular mucin,polyp=benign  . Hypertension     essential (benign)  . Rheumatic fever as child    hx  . Hearing loss     partial greater on left  . Tinnitus     left ear  . Sinusitis     seasonal  . Rectal bleeding     last month hx  . Arthritis     hands/knees  . Hematochezia     recent  . Heart murmur   . Bronchitis   . Hypothyroidism as child    hx of    Past Surgical History  Procedure Laterality Date  . Carpal tunnel release Right 2002  . Tonsillectomy      as child  . Inguinal hernia repair  1982    left side  . Laparoscopic low anterior resection N/A 12/27/2012    Procedure: LAPAROSCOPIC LOW ANTERIOR RESECTION, COLO-ANAL ANASTOMOSIS, DIVERTING LOOP ILEOSTOMY,SPLENIC FLEXURE MOBILIZATION, PRIMARY INCARCERATED UMBILICAL HERNIA REPAIR;  Surgeon: Ardeth Sportsman, MD;  Location: [facility];  Service: General;  Laterality: N/A;  . Laparoscopic low anterior rescection with coloanal anastomosis N/A 12/27/2012    Procedure: LAPAROSCOPIC LOW ANTERIOR RESCECTION WITH COLOANAL ANASTOMOSIS;  Surgeon: Salvatore Decent.  Aolanis Crispen, MD;  Location: [facility];  Service: General;  Laterality: N/A;  . Ileostomy N/A 12/27/2012    Procedure: DIVERTING LOOP ILEOSTOMY;  Surgeon: Ardeth Sportsman, MD;  Location: [facility];  Service: General;  Laterality: N/A;  . Umbilical hernia repair  12/27/2012    Procedure: PRIMARY REPAIR INCARCERATED UMBILICAL HERNIA;  Surgeon: Ardeth Sportsman, MD;  Location: [facility];  Service: General;;    History   Social History  . Marital Status: Married    Spouse Name: N/A    Number of Children: 2  . Years of Education: N/A    Occupational History  . Retired     Development worker, international aid   Social History Main Topics  . Smoking status: Former Smoker -- 1.50 packs/day for 20 years    Types: Cigarettes    Quit date: 07/27/1972  . Smokeless tobacco: Never Used  . Alcohol Use: No  . Drug Use: No     Comment: quit smoking 35 years ago  . Sexually Active: Not on file   Other Topics Concern  . 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  Married    Family History  Problem Relation Age of Onset  . Cancer Mother     abdominal ca ?ovarian?  . Cancer Sister     liver cancer    Current Outpatient Prescriptions  Medication Sig Dispense Refill  . acetaminophen (TYLENOL) 500 MG tablet Take 500 mg by mouth every 6 (six) hours as needed for pain.      Marland Kitchen acetaminophen-codeine (TYLENOL #3) 300-30 MG per tablet Take 1 tablet by mouth every 6 (six) hours as needed.       . ferrous sulfate 325 (65 FE) MG tablet Take 1 tablet (325 mg total) by mouth 2 (two) times daily at 8 am and 10 pm.  40 tablet  2  . hydrocortisone 2.5 % cream       . lactated ringers infusion       . lisinopril (PRINIVIL,ZESTRIL) 20 MG tablet Take 1 tablet (20 mg total) by mouth daily.  30 tablet  1  . loperamide (IMODIUM) 2 MG capsule Take 1-2 capsules (2-4 mg total) by mouth every 8 (eight) hours as needed for diarrhea or loose stools (Use if >2 BM every 8 hours).  40 capsule  2  . Multiple Vitamin (MULTIVITAMIN WITH MINERALS) TABS Take 1 tablet by mouth daily.      Marland Kitchen neomycin (MYCIFRADIN) 500 MG tablet       . polyethylene glycol powder (GLYCOLAX/MIRALAX) powder       . pravastatin (PRAVACHOL) 20 MG tablet Take 20 mg by mouth every morning.       . promethazine (PHENERGAN) 25 MG tablet Take 25 mg by mouth every 6 (six) hours as needed for nausea.       . traMADol (ULTRAM) 50 MG tablet       . hydrocortisone (PROCTOZONE-HC) 2.5 % rectal cream Place rectally 2 (two) times daily.  30 g  0   No  current facility-administered medications for this visit.     Allergies  Allergen Reactions  . Shrimp (Shellfish Allergy) Other (See Comments)    Whelps and sick    BP 102/60  Pulse 104  Resp 14  Ht 5\' 10"  (1.778 m)  Wt 181 lb 3.2 oz (82.192 kg)  BMI 26 kg/m2  No results found.   Review of Systems  Constitutional: Negative for  fever, chills, diaphoresis, activity change and fatigue.  HENT: Negative for sore throat, trouble swallowing and neck pain.   Eyes: Negative for photophobia and visual disturbance.  Respiratory: Negative for choking and shortness of breath.   Cardiovascular: Negative for chest pain and palpitations.  Gastrointestinal: Positive for rectal pain. Negative for nausea, vomiting, constipation, blood in stool, abdominal distention and anal bleeding.  Genitourinary: Negative for dysuria, urgency, frequency, difficulty urinating and testicular pain.  Musculoskeletal: Negative for myalgias, arthralgias and gait problem.  Skin: Negative for color change and rash.  Neurological: Negative for dizziness, tremors, syncope, speech difficulty, light-headedness and numbness.  Hematological: Negative for adenopathy.  Psychiatric/Behavioral: Negative for hallucinations, confusion and agitation. The patient is not nervous/anxious.        Objective:   Physical Exam  Constitutional: He is oriented to person, place, and time. He appears well-developed and well-nourished. No distress.  HENT:  Head: Normocephalic.  Mouth/Throat: Oropharynx is clear and moist. No oropharyngeal exudate.  Eyes: Conjunctivae and EOM are normal. Pupils are equal, round, and reactive to light. No scleral icterus.  Neck: Normal range of motion. No tracheal deviation present.  Cardiovascular: Normal rate, normal heart sounds and intact distal pulses.   Pulmonary/Chest: Effort normal. No respiratory distress.  Abdominal: Soft. He exhibits no distension. There is no tenderness. Hernia confirmed  negative in the right inguinal area and confirmed negative in the left inguinal area.    Incisions clean with normal healing ridges.  No hernias  Genitourinary:     Musculoskeletal: Normal range of motion. He exhibits no tenderness.  Neurological: He is alert and oriented to person, place, and time. No cranial nerve deficit. He exhibits normal muscle tone. Coordination normal.  Skin: Skin is warm and dry. No rash noted. He is not diaphoretic.  Psychiatric: He has a normal mood and affect. His behavior is normal.       Assessment:     Recovering rather well only 2-1/2 weeks status post laparoscopic.  Low anterior resection with coloanal handsewn anastomosis and diverting loop ileostomy for bulky ypT3ypN0 cancer     Plan:     Continue three times a week IV fluid infusions to avoid dehydration.  Check electrolytes.  Continue iron for anemia.  Check CBC.  Keep appointment with medical oncology.  He is due to see Dr. Truett Perna later this week.  Keep appointment with cardiology.  Lower lisinopril down to 10 mg (half pill) a day.  Eventually, will need to increase lisinopril back up to 20 BID.  Continue to hold the diuretic.  Increase activity as tolerated to regular activity.  Low impact exercise such as walking an hour a day at least ideal.  Do not push through pain.  Ostomy care.  They have the video/care box.  They have contact information for [facility] medical supply if needed.  Diet as tolerated.  Low fat high fiber diet ideal.  Bowel regimen with 30 g fiber a day and fiber supplement as needed to avoid problems.  Continue bowel regimen with iron and Imodium when necessary.  Return to clinic in 2-3 weeks.  Instructions discussed.  Followup with primary care physician for other health issues as would normally be done.  Questions answered.  The patient expressed understanding and appreciation

## 2013-01-14 NOTE — Patient Instructions (Addendum)
Please keep your appointment with medical oncology (Dr. Mancel Bale)  Please keep the appointment with your cardiologist, Dr. Donnie Aho.  Be sure to remind him your blood pressure.  Take half of a lisinopril a day.  Please get blood work to make sure your anemia is improved and not having issues with renal failure or electrolyte abnormalities.  Call us for the results in a few days.  Ostomy Support Information  Yes, you've heard that people get along just fine with only one of their eyes, or one of their lungs, or one of their kidneys. But you also know that you have only one intestine and only one bladder, and that leaves you feeling awfully empty, both physically and emotionally: You think no other people go around without part of their intestine with the ends of their intestines sticking out through their abdominal walls.  Well, you are wrong! There are nearly three quarters of a million people in the [place] who have an ostomy; people who have had surgery to remove all or part of their colons or bladders. There is even a national association, the [place] [employer] [place] with over 350 local affiliated support groups that are organized by volunteers who provide peer support and counseling. Cheral Marker has a toll free telephone num-ber, [phone] and an educational,  interactive website, www.ostomy.org   An ostomy is an opening in the belly (abdominal wall) made by surgery. Ostomates are people who have had this procedure. The opening (stoma) allows the kidney or bowel to discharge waste. An external pouch covers the stoma to collect waste. Pouches are are a simple bag and are odor free. Different companies have disposable or reusable pouches to fit one's lifestyle. An ostomy can either be temporary or permanent.  THERE ARE THREE MAIN TYPES OF OSTOMIES  Colostomy. A colostomy is a surgically created opening in the large intestine (colon).  Ileostomy. An ileostomy is a surgically created opening  in the small intestine.  Urostomy. A urostomy is a surgically created opening to divert urine away from the bladder. FREQUENTLY ASKED QUESTIONS   Why haven't you met any of these folks who have an ostomy?  Well, maybe you have! You just did not recognize them because an ostomy doesn't show. It can be kept secret if you wish. Why, maybe some of your best friends, office associates or neighbors have an ostomy ... you never can tell.   People facing ostomy surgery have many quality-of-life questions like:  Will you bulge? Smell? Make noises? Will you feel waste leaving your body? Will you be a captive of the toilet? Will you starve? Be a social outcast? Get/stay married? Have babies? Easily bathe, go swimming, bend over?  OK, let's look at what you can expect:  Will you bulge?  Remember, without part of the intestine or bladder, and its contents, you should have a flatter tummy than before. You can expect to wear, with little exception, what you wore before surgery ... and this in-cludes tight clothing and bathing suits.  Will you smell?  Today, thanks to modern odor proof pouching systems, you can walk into an ostomy support group meeting and not smell anything that is foul or offensive. And, for those with an ileostomy or colostomy who are concerned about odor when emptying their pouch, there are in-pouch deodorants that can be used to eliminate any waste odors that may exist.  Will you make noises?  Everyone produces gas, especially if they are an air-swallower. But intestinal sounds  that occur from time to time are no differ-ent than a gurgling tummy, and quite often your clothing will muffle any sounds.   Will you feel the waste discharges?  For those with a colostomy or ileostomy there might be a slight pressure when waste leaves your body, but understand that the intestines have no nerve endings, so there will be no unpleasant sensations. Those with a urostomy will probably be unaware of any  kidney drainage.  Will you be a captive of the toilet?  Immediately post-op you will spend more time in the bathroom than you will after your body recovers from surgery. Every person is different, but on average those with an ileostomy or urostomy may empty their pouches 4 to 6 times a day; a little  less if you have a colostomy. The average wear time between pouch system changes is 3 to 5 days and the changing process should take less than 30 minutes.  Will I need to be on a special diet? Most people return to their normal diet when they have recovered from surgery. Be sure to chew your food well, eat a well-balanced diet and drink plenty of fluids. If you experience problems with a certain food, wait a couple of weeks and try it again. Will there be odor and noises? Pouching systems are designed to be odor-proof or odor-resistant. There are deodorants that can be used in the pouch. Medications are also available to help reduce odor. Limit gas-producing foods and carbonated beverages. You will experience less gas and fewer noises as you heal from surgery. How much time will it take to care for my ostomy? At first, you may spend a lot of time learning about your ostomy and how to take care of it. As you become more comfortable and skilled at changing the pouching system, it will take very little time to care for it.  Will I be able to return to work? People with ostomies can perform most jobs. As soon as you have healed from surgery, you should be able to return to work. Heavy lifting (more than 10 pounds) may be discouraged.  What about intimacy? Sexual relationships and intimacy are important and fulfilling aspects of your life. They should continue after ostomy surgery. Intimacy-related concerns should be discussed openly between you and your partner.  Can I wear regular clothing? You do not need to wear special clothing. Ostomy pouches are fairly flat and barely noticeable. Elastic undergarments  will not hurt the stoma or prevent the ostomy from functioning.  Can I participate in sports? An ostomy should not limit your involvement in sports. Many people with ostomies are runners, skiers, swimmers or participate in other active lifestyles. Talk with your caregiver first before doing heavy physical activity.  Will you starve?  Not if you follow doctor's orders at each stage of your post-op adjustment. There is no such thing as an "ostomy diet". Some people with an ostomy will be able to eat and tolerate anything; others may find diffi-culty with some foods. Each person is an individual and must determine, by trial, what is best for them. A good practice for all is to drink plenty of water.  Will you be a social outcast?  Have you met anyone who has an ostomy and is a social outcast? Why should you be the first? Only your attitude and self image will effect how you are treated. No confi-dent person is an Investment banker, corporate.   PROFESSIONAL HELP  Resources are available if you  need help or have questions about your ostomy.    Specially trained nurses called Wound, Ostomy Continence Nurses (WOCN) are available for consultation in most major medical centers.   Consider getting an ostomy consult with Page Spiro at [facility] to help troubleshoot collagen fittings and other issues with your ostomy: [phone]   The [employer] Association (UOA) is a group made up of many local chapters throughout the [place]. These local groups hold meetings and provide support to prospective and existing ostomates. They sponsor educational events and have qualified visitors to make personal or telephone visits. Contact the UOA for the chapter nearest you and for other educational publications.  More detailed information can be found in Colostomy Guide, a publication of the [employer] (UOA). Contact UOA at 1-727 081 9199 or visit their web site at YellowSpecialist.at. The website contains  links to other sites, suppliers and resources. Document Released: 05/26/2003 Document Revised: 08/15/2011 Document Reviewed: 09/24/2008 [facility] Patient Information 2013 [place], [place].

## 2013-01-15 ENCOUNTER — Encounter (INDEPENDENT_AMBULATORY_CARE_PROVIDER_SITE_OTHER): Payer: Self-pay

## 2013-01-15 ENCOUNTER — Telehealth (INDEPENDENT_AMBULATORY_CARE_PROVIDER_SITE_OTHER): Payer: Self-pay

## 2013-01-15 NOTE — Telephone Encounter (Signed)
Called Tyler Diaz to notify him that his labs are back and are normal per Dr Michaell Cowing. The Tyler Diaz advised that his hgb is a 10 which means the anemia improved but the Tyler Diaz needs to continue the iron supplements for 2 more months per Dr Michaell Cowing. The Tyler Diaz asked about using an ice pad over the picc line site b/c it was a little swollen today and I agreed to the ice pad. The Tyler Diaz understands.

## 2013-01-16 ENCOUNTER — Telehealth (INDEPENDENT_AMBULATORY_CARE_PROVIDER_SITE_OTHER): Payer: Self-pay | Admitting: General Surgery

## 2013-01-16 NOTE — Telephone Encounter (Signed)
Herbert Seta, nurse with [facility], called and was given a verbal order for weekly PICC line care [facility].

## 2013-01-18 ENCOUNTER — Ambulatory Visit (HOSPITAL_BASED_OUTPATIENT_CLINIC_OR_DEPARTMENT_OTHER): Payer: Medicare Other | Admitting: Oncology

## 2013-01-18 ENCOUNTER — Telehealth (INDEPENDENT_AMBULATORY_CARE_PROVIDER_SITE_OTHER): Payer: Self-pay | Admitting: General Surgery

## 2013-01-18 ENCOUNTER — Other Ambulatory Visit: Payer: Self-pay | Admitting: *Deleted

## 2013-01-18 ENCOUNTER — Telehealth: Payer: Self-pay | Admitting: Oncology

## 2013-01-18 VITALS — BP 122/69 | HR 112 | Temp 97.2°F | Resp 18 | Ht 70.0 in | Wt 182.9 lb

## 2013-01-18 DIAGNOSIS — I10 Essential (primary) hypertension: Secondary | ICD-10-CM

## 2013-01-18 DIAGNOSIS — I82401 Acute embolism and thrombosis of unspecified deep veins of right lower extremity: Secondary | ICD-10-CM

## 2013-01-18 DIAGNOSIS — C20 Malignant neoplasm of rectum: Secondary | ICD-10-CM

## 2013-01-18 DIAGNOSIS — I82609 Acute embolism and thrombosis of unspecified veins of unspecified upper extremity: Secondary | ICD-10-CM

## 2013-01-18 MED ORDER — ENOXAPARIN SODIUM 80 MG/0.8ML ~~LOC~~ SOLN
80.0000 mg | Freq: Once | SUBCUTANEOUS | Status: AC
  Administered 2013-01-18: 80 mg via SUBCUTANEOUS
  Filled 2013-01-18: qty 0.8

## 2013-01-18 NOTE — Progress Notes (Signed)
DVT RUE probable per Dr. Truett Perna : Will give Lovenox 80 mg today and 120 mg to be given on 8/16 and 8/17.

## 2013-01-18 NOTE — Telephone Encounter (Signed)
Pt's wife called to ask for husband's lab results from 01/14/13 be forwarded to Dr. Donnie Aho, at [facility] [phone].  This was done (including CBC and BMP.)

## 2013-01-18 NOTE — Patient Instructions (Addendum)
Enoxaparin injection What is this medicine? ENOXAPARIN (ee nox a PA rin) is used after knee, hip, or abdominal surgeries to prevent blood clotting. It is also used to treat existing blood clots in the lungs or in the veins. This medicine may be used for other purposes; ask your health care provider or pharmacist if you have questions. What should I tell my health care provider before I take this medicine? They need to know if you have any of these conditions: -bleeding disorders, hemorrhage, or hemophilia -infection of the heart or heart valves -kidney or liver disease -previous stroke -prosthetic heart valve -recent surgery or delivery of a baby -ulcer in the stomach or intestine, diverticulitis, or other bowel disease -an unusual or allergic reaction to enoxaparin, heparin, pork or pork products, other medicines, foods, dyes, or preservatives -pregnant or trying to get pregnant -breast-feeding How should I use this medicine? This medicine is for injection under the skin. It is usually given by a health-care professional. You or a family member may be trained on how to give the injections. If you are to give yourself injections, make sure you understand how to use the syringe, measure the dose if necessary, and give the injection. To avoid bruising, do not rub the site where this medicine has been injected. Do not take your medicine more often than directed. Do not stop taking except on the advice of your doctor or health care professional. Make sure you receive a puncture-resistant container to dispose of the needles and syringes once you have finished with them. Do not reuse these items. Return the container to your doctor or health care professional for proper disposal. Talk to your pediatrician regarding the use of this medicine in children. Special care may be needed. Overdosage: If you think you have taken too much of this medicine contact a poison control center or emergency  room at once. NOTE: This medicine is only for you. Do not share this medicine with others. What if I miss a dose? If you miss a dose, take it as soon as you can. If it is almost time for your next dose, take only that dose. Do not take double or extra doses. What may interact with this medicine? Do not take this medicine with any of the following medications: -aspirin and aspirin-like medicines -heparin -mifepristone -warfarin This medicine may also interact with the following medications: -cilostazol -clopidogrel -dipyridamole -NSAIDs, medicines for pain and inflammation, like ibuprofen or naproxen -sulfinpyrazone -ticlopidine This list may not describe all possible interactions. Give your health care provider a list of all the medicines, herbs, non-prescription drugs, or dietary supplements you use. Also tell them if you smoke, drink alcohol, or use illegal drugs. Some items may interact with your medicine. What should I watch for while using this medicine? Visit your doctor or health care professional for regular checks on your progress. Your condition will be monitored carefully while you are receiving this medicine. Contact your doctor or health care professional and seek emergency treatment if you develop increased difficulty in breathing, chest pain, dizziness, shortness of breath, swelling in the legs or arms, abdominal pain, decreased vision, pain when walking, or pain and warmth of the arms or legs. These can be signs that your condition has gotten worse. Monitor your skin closely for easy bruising or red spots, which can be signs of bleeding. If you notice easy bruising or minor bleeding from the nose, gums/teeth, in your urine, or stool, contact your  doctor or health care professional right away. The dose of your medicine may need to be changed. If you are going to have surgery, tell your doctor or health care professional that you are taking this medicine. Try to avoid injury while  you are using this medicine. Be careful when brushing or flossing your teeth, shaving, cutting your fingernails or toenails, or when using sharp objects. Report any injuries to your doctor or health care professional. What side effects may I notice from receiving this medicine? Side effects that you should report to your doctor or health care professional as soon as possible: -allergic reactions like skin rash, itching or hives, swelling of the face, lips, or tongue -black, tarry stools -breathing problems -dark urine -feeling faint or lightheaded, falls -fever -heavy menstrual bleeding -unusual bruising or bleeding Side effects that usually do not require medical attention (report to your doctor or health care professional if they continue or are bothersome): -pain or irritation at the injection site This list may not describe all possible side effects. Call your doctor for medical advice about side effects. You may report side effects to FDA at 1-800-FDA-1088. Where should I keep my medicine? Keep out of the reach of children. Store at room temperature between 15 and 30 degrees C (59 and 86 degrees F). Do not freeze. If your injections have been specially prepared, you may need to store them in the refrigerator. Ask your pharmacist. Throw away any unused medicine after the expiration date. NOTE: This sheet is a summary. It may not cover all possible information. If you have questions about this medicine, talk to your doctor, pharmacist, or health care provider.  2013, [employer]vier/[employer] Standard. (08/03/2007 4:47:37 PM)  Deep Vein Thrombosis A deep vein thrombosis (DVT) is a blood clot that develops in a deep vein. A DVT is a clot in the deep, larger veins of the leg, arm, or pelvis. These are more dangerous than clots that might form in veins near the surface of the body. A DVT can lead to complications if the clot breaks off and travels in the bloodstream to the lungs.  A DVT can damage the  valves in your leg veins, so that instead of flowing upwards, the blood pools in the lower leg. This is called post-thrombotic syndrome, and can result in pain, swelling, discoloration, and sores on the leg. Once identified, a DVT can be treated. It can also be prevented in some circumstances. Once you have had a DVT, you may be at increased risk for a DVT in the future. CAUSES Blood clots form in a vein for different reasons. Usually several things contribute to blood clots. Contributing factors include:  The flow of blood slows down.  The inside of the vein is damaged in some way.  The person has a condition that makes blood clot more easily. Some people are more likely than others to develop blood clots. That is because they have more factors that make clots likely. These are called risk factors. Risk factors include:   Older age, especially over 79 years old.  Having a history of blood clots. This means you have had one before. Or, it means that someone else in your family has had blood clots. You may have a genetic tendency to form clots.  Having major or lengthy surgery. This is especially true for surgery on the hip, knee, or belly (abdomen). Hip surgery is particularly high risk.  Breaking a hip or leg.  Sitting or lying still for a  long time. This includes long distance travel, paralysis, or recovery from an illness or surgery.  Cancer, or cancer treatment.  Having a long, thin tube (catheter) placed inside a vein during a medical procedure.  Being overweight (obese).  Pregnancy and childbirth. Hormone changes make the blood clot more easily during pregnancy. The fetus puts pressure on the veins of the pelvis. There is also risk of injury to veins during delivery or a caesarean. The risk is at its highest just after childbirth.  Medicines with the male hormone estrogen. This includes birth control pills and hormone replacement therapy.  Smoking.  Other circulation or  heart problems. SYMPTOMS When a clot forms, it can either partially or totally block the blood flow in that vein. Symptoms of a DVT can include:  Swelling of the leg or arm, especially if one side is much worse.  Warmth and redness of the leg or arm, especially if one side is much worse.  Pain in an arm or leg. If the clot is in the leg, symptoms may be more noticeable or worse when standing or walking. The symptoms of a DVT that has traveled to the lungs (pulmonary embolism, PE) usually start suddenly, and include:  Shortness of breath.  Coughing.  Coughing up blood or blood-tinged phlegm.  Chest pain. The chest pain is often worse with deep breaths.  Rapid heartbeat. Anyone with these symptoms should get emergency medical treatment right away. Call your local emergency services (911 in [place] if you have these symptoms. DIAGNOSIS If a DVT is suspected, your caregiver will take a full medical history and carry out a physical exam. Tests that also may be required include:  Blood tests, including studies of the clotting properties of the blood.  Ultrasonography to see if you have clots in your legs or lungs.  X-rays to show the flow of blood when dye is injected into the veins (venography).  Studies of your lungs, if you have any chest symptoms. PREVENTION  Exercise the legs regularly. Take a brisk 30 minute walk every day.  Maintain a weight that is appropriate for your height.  Avoid sitting or lying in bed for long periods of time without moving your legs.  Women, particularly those over the age of 24, should consider the risks and benefits of taking estrogen medicines, including birth control pills.  Do not smoke, especially if you take estrogen medicines.  Long distance travel can increase your risk of DVT. You should exercise your legs by walking or pumping the muscles every hour.  In-hospital prevention:  Many of the risk factors above relate to situations that  exist with hospitalization, either for illness, injury, or elective surgery.  Your caregiver will assess you for the need for venous thromboembolism prophylaxis when you are admitted to the hospital. If you are having surgery, your surgeon will assess you the day of or day after surgery.  Prevention may include medical and nonmedical measures. TREATMENT Treatment for DVT helps prevent death and disability. The most common treatment for DVT is blood thinning (anticoagulant) medicine, which reduces the blood's tendency to clot. Anticoagulants can stop new blood clots from forming and old ones from growing. They cannot dissolve existing clots. Your body does this by itself over time. Anticoagulants can be given by mouth, by intravenous (IV) access, or by injection. Your caregiver will determine the best program for you.  Heparin or related medicines (low molecular weight heparin) are usually the first treatment for a blood clot. They act  quickly. However, they cannot be taken orally.  Heparin can cause a fall in a component of blood that stops bleeding and forms blood clots (platelets). You will be monitored with blood tests to be sure this does not occur.  Warfarin is an anticoagulant that can be swallowed (taken orally). It takes a few days to start working, so usually heparin or related medicines are used in combination. Once warfarin is working, heparin is usually stopped.  Less commonly, clot dissolving drugs (thrombolytics) are used to dissolve a DVT. They carry a high risk of bleeding, so they are used mainly in severe cases, where a life or limb is threatened.  Very rarely, a blood clot in the leg needs to be removed surgically.  If you are unable to take anticoagulants, your caregiver may arrange for you to have a filter placed in a main vein in your belly (abdomen). This filter prevents clots from traveling to your lungs. HOME CARE INSTRUCTIONS  Take all medicines prescribed by your  caregiver. Follow the directions carefully.  Warfarin. Most people will continue taking warfarin after hospital discharge. Your caregiver will advise you on the length of treatment (usually 3 6 months, sometimes lifelong).  Too much and too little warfarin are both dangerous. Too much warfarin increases the risk of bleeding. Too little warfarin continues to allow the risk for blood clots. While taking warfarin, you will need to have regular blood tests to measure your blood clotting time. These blood tests usually include both the prothrombin time (PT) and international normalized ratio (INR) tests. The PT and INR results allow your caregiver to adjust your dose of warfarin. The dose can change for many reasons. It is critically important that you take warfarin exactly as prescribed, and that you have your PT and INR levels drawn exactly as directed.  Many foods, especially foods high in vitamin K can interfere with warfarin and affect the PT and INR results. Foods high in vitamin K include spinach, kale, broccoli, cabbage, collard and turnip greens, brussels sprouts, peas, cauliflower, seaweed, and parsley as well as beef and pork liver, green tea, and soybean oil. You should eat a consistent amount of foods high in vitamin K. Avoid major changes in your diet, or notify your caregiver before changing your diet. Arrange a visit with a dietitian to answer your questions.  Many medicines can interfere with warfarin and affect the PT and INR results. You must tell your caregiver about any and all medicines you take, this includes all vitamins and supplements. Be especially cautious with aspirin and anti-inflammatory medicines. Ask your caregiver before taking these. Do not take or discontinue any prescribed or over-the-counter medicine except on the advice of your caregiver or pharmacist.  Warfarin can have side effects, primarily excessive bruising or bleeding. You will need to hold pressure over cuts for  longer than usual. Your caregiver or pharmacist will discuss other potential side effects.  Alcohol can change the body's ability to handle warfarin. It is best to avoid alcoholic drinks or consume only very small amounts while taking warfarin. Notify your caregiver if you change your alcohol intake.  Notify your dentist or other caregivers before procedures.  Activity. Ask your caregiver how soon you can go back to normal activities. It is important to stay active to prevent blood clots. If you are on anticoagulant medicine, avoid contact sports.  Exercise. It is very important to exercise. This is especially important while traveling, sitting or standing for long periods of time. Exercise  your legs by walking or by pumping the muscles frequently. Take frequent walks.  Compression stockings. These are tight elastic stockings that apply pressure to the lower legs. This pressure can help keep the blood in the legs from clotting. You may need to wear compressions stockings at home to help prevent a DVT.  Smoking. If you smoke, quit. Ask your caregiver for help with quitting smoking.  Learn as much as you can about DVT. Knowing more about the condition should help you keep it from coming back.  Wear a medical alert bracelet or carry a medical alert card. SEEK MEDICAL CARE IF:  You notice a rapid heartbeat.  You feel weaker or more tired than usual.  You feel faint.  You notice increased bruising.  You feel your symptoms are not getting better in the time expected.  You believe you are having side effects of medicine. SEEK IMMEDIATE MEDICAL CARE IF:  You have chest pain.  You have trouble breathing.  You have new or increased swelling or pain in one leg.  You cough up blood.  You notice blood in vomit, in a bowel movement, or in urine. MAKE SURE YOU:  Understand these instructions.  Will watch your condition.  Will get help right away if you are not doing well or get  worse. Document Released: 05/23/2005 Document Revised: 02/15/2012 Document Reviewed: 07/15/2010 [facility] Patient Information 2014 [place], [place].  It is probable that you have a blood clot in your right arm : Give Lovenox 120 mg subcutaneous to abdomen on 8/16 and again on 8/17. You will be scheduled for for venous doppler on Monday. Return to [facility] on Saturday for injection.

## 2013-01-18 NOTE — Telephone Encounter (Signed)
gave pt appt for injections, and MD for 8/18 , emailed Susan Moore regarding chemo on 8/27

## 2013-01-18 NOTE — Progress Notes (Signed)
Educated and witnessed pt's son, Vonna Kotyk given the lovenox injection to patient in the abdomen.  Pt tolerated well.  Educational information and supplies (sharps container, gauze, bandaids) given to patient.  SLJ

## 2013-01-18 NOTE — Progress Notes (Signed)
Met with patient and family to assess for barriers to care.  They had questions re: ostomy care and supplies.  His son stated he has been caring for his father's ostomy.  He would like further information.  This RN provided him with information from [place] on supplies and the toll free customer care number.  This RN also gave name, number, and directions for [employer]. They will make an appointment to see her next week when they come back to [place].  Patient denied other barriers to care at present time.  Will continue to follow as needed.

## 2013-01-19 ENCOUNTER — Ambulatory Visit (HOSPITAL_BASED_OUTPATIENT_CLINIC_OR_DEPARTMENT_OTHER): Payer: Medicare Other

## 2013-01-19 VITALS — BP 103/65 | HR 109 | Temp 97.8°F

## 2013-01-19 DIAGNOSIS — I82B19 Acute embolism and thrombosis of unspecified subclavian vein: Secondary | ICD-10-CM

## 2013-01-19 DIAGNOSIS — I82629 Acute embolism and thrombosis of deep veins of unspecified upper extremity: Secondary | ICD-10-CM

## 2013-01-19 DIAGNOSIS — I82A19 Acute embolism and thrombosis of unspecified axillary vein: Secondary | ICD-10-CM

## 2013-01-19 MED ORDER — ENOXAPARIN SODIUM 120 MG/0.8ML ~~LOC~~ SOLN
120.0000 mg | Freq: Once | SUBCUTANEOUS | Status: AC
  Administered 2013-01-19: 120 mg via SUBCUTANEOUS

## 2013-01-19 NOTE — Progress Notes (Signed)
Cancer Center    OFFICE PROGRESS NOTE   INTERVAL HISTORY:   I saw Mr. Laurel when he was diagnosed with locally advanced rectal cancer earlier this year. He completed neoadjuvant Xeloda and radiation at the [facility]. He reports tolerating the neoadjuvant therapy well.  He was taken to the operating room by Dr. Michaell Cowing on 12/27/2012 for a laparoscopic low anterior resection and coloanal anastomosis. A diverting loop ileostomy was created.tumor was noted at the distal and mid rectum. No metastatic disease on the peritoneum or liver. The pathology confirmed a well differentiated adenocarcinoma extending into the perirectal connective tissue. 3 lymph nodes contained metastatic carcinoma.there were also 2 tumor deposits  Identified. The tumor returned microsatellite stable with no loss of mismatch repair proteins. The resection margins were negative.  He is recovering from surgery. He is receiving 3 days per week IV fluids via a right arm PICC. The right arm has become swollen and painful over the past week.   Objective:  Vital signs in last 24 hours:  Blood pressure 122/69, pulse 112, temperature 97.2 F (36.2 C), temperature source Oral, resp. rate 18, height 5\' 10"  (1.778 m), weight 182 lb 14.4 oz (82.963 kg).    HEENT: neck without mass Lymphatics: no cervical, supraclavicular, axillary, or inguinal nodes Resp: lungs clear bilaterally Cardio: regular rate and rhythm GI: no hepatomegaly, right lower quadrant ileostomy, healed surgical incisions Vascular: the right arm is edematous. There is venous engorgement over the right anterior chest wall with slight swelling in the right low neck/supraclavicular fossa. Trace edema at the right lower leg.   Portacath/PICC-without erythema  Lab Results:  Lab Results  Component Value Date   WBC 9.6 01/14/2013   HGB 10.1* 01/14/2013   HCT 30.3* 01/14/2013   MCV 95.3 01/14/2013   PLT 341 01/14/2013      Medications: I  have reviewed the patient's current medications.  Assessment/Plan:  1.stage III rectal cancer, presentation with a locally advanced rectal tumor-CT/MRI evidence of perirectal lymphadenopathy, status post neoadjuvant Xeloda and radiation -Status post a low anterior resection and coloanal anastomosis 07/24/2014with the pathology confirming a ypT3,ypN1b tumor, microsatellite stable  2. Small bilateral lung nodules and a mildly enlarged gastrohepatic node on the initial staging CT scans 08/02/2012  3. Elevated pretreatment CEA  4. mitral regurgitation  5. hypertension  6. History of rheumatic fever Disposition:  7. Pain and swelling at the right arm, trace edema the right lower leg-probable right upper extremity DVT    Mr. Woolever underwent a low anterior resection on 12/27/2012. He has been diagnosed with stage III rectal cancer. I discussed the pathology report and adjuvant treatment options with Mr. Volkert and his family. He is at high-risk of developing recurrent colon cancer based on the initial clinical staging and pathologic findings. I recommend adjuvant chemotherapy.  We discussed the data supporting a benefit with oxaliplatin given in addition to 5-fluorouracil in patients with resected stage III colon cancer. He understands the lack of data to support the use of oxaliplatin in patients with rectal cancer. However he has a high risk of developing recurrent rectal cancer based on the multiple involved lymph nodes and tumor deposits. I recommend adjuvant CAPOX.  We reviewed the potential toxicities associated with this regimen including the chance for nausea/vomiting, mucositis, diarrhea, and hematologic toxicity. We discussed the skin rash, hyperpigmentation, and hand/foot syndrome associated with capecitabine. We reviewed the various types of neuropathy seen with oxaliplatin.  He agrees to proceed with adjuvant CAPOX.  Mr. Schalk appears to have a right upper extremity deep vein  thrombosis today. He will be started on Lovenox anticoagulation and we will arrange for Doppler evaluation as of the right upper extremity and right leg. Mr. Latner will return for an office visit and  Cycle 1 of CAPOX on 01/30/2013.the plan is to deliver 5 cycles of adjuvant CAPOX.  We will also refer him for a restaging CT of the chest to follow up on the lung nodules. We will obtain a CEA when he returns for Lovenox next week.  Approximately 45 minutes were spent with the patient and his family today. The majority of the time was spent in counseling/coordination of care.   Thornton Papas, MD  01/19/2013  9:37 AM

## 2013-01-20 ENCOUNTER — Other Ambulatory Visit: Payer: Self-pay | Admitting: Oncology

## 2013-01-21 ENCOUNTER — Other Ambulatory Visit (HOSPITAL_BASED_OUTPATIENT_CLINIC_OR_DEPARTMENT_OTHER): Payer: Medicare Other | Admitting: Lab

## 2013-01-21 ENCOUNTER — Ambulatory Visit (HOSPITAL_BASED_OUTPATIENT_CLINIC_OR_DEPARTMENT_OTHER): Payer: Medicare Other

## 2013-01-21 ENCOUNTER — Ambulatory Visit (HOSPITAL_COMMUNITY)
Admission: RE | Admit: 2013-01-21 | Discharge: 2013-01-21 | Disposition: A | Discharge status: Home / Self Care | Payer: Medicare Other | Source: Ambulatory Visit | Attending: Oncology | Admitting: Oncology

## 2013-01-21 ENCOUNTER — Other Ambulatory Visit: Payer: Self-pay | Admitting: *Deleted

## 2013-01-21 ENCOUNTER — Telehealth: Payer: Self-pay | Admitting: *Deleted

## 2013-01-21 ENCOUNTER — Ambulatory Visit (HOSPITAL_COMMUNITY): Payer: Medicare Other

## 2013-01-21 DIAGNOSIS — C20 Malignant neoplasm of rectum: Secondary | ICD-10-CM

## 2013-01-21 DIAGNOSIS — M79609 Pain in unspecified limb: Secondary | ICD-10-CM

## 2013-01-21 DIAGNOSIS — I82401 Acute embolism and thrombosis of unspecified deep veins of right lower extremity: Secondary | ICD-10-CM

## 2013-01-21 DIAGNOSIS — M7989 Other specified soft tissue disorders: Secondary | ICD-10-CM

## 2013-01-21 DIAGNOSIS — I82409 Acute embolism and thrombosis of unspecified deep veins of unspecified lower extremity: Secondary | ICD-10-CM

## 2013-01-21 DIAGNOSIS — R609 Edema, unspecified: Secondary | ICD-10-CM | POA: Insufficient documentation

## 2013-01-21 LAB — COMPREHENSIVE METABOLIC PANEL (CC13)
ALT: 107 U/L — ABNORMAL HIGH (ref 0–55)
AST: 40 U/L — ABNORMAL HIGH (ref 5–34)
Alkaline Phosphatase: 144 U/L (ref 40–150)
Creatinine: 0.9 mg/dL (ref 0.7–1.3)
Total Bilirubin: 0.3 mg/dL (ref 0.20–1.20)

## 2013-01-21 LAB — CBC WITH DIFFERENTIAL/PLATELET
BASO%: 1 % (ref 0.0–2.0)
Basophils Absolute: 0.1 10*3/uL (ref 0.0–0.1)
EOS%: 5 % (ref 0.0–7.0)
HGB: 10.2 g/dL — ABNORMAL LOW (ref 13.0–17.1)
MCH: 32.1 pg (ref 27.2–33.4)
RBC: 3.17 10*6/uL — ABNORMAL LOW (ref 4.20–5.82)
RDW: 14.2 % (ref 11.0–14.6)
lymph#: 0.7 10*3/uL — ABNORMAL LOW (ref 0.9–3.3)

## 2013-01-21 LAB — CEA: CEA: 1.1 ng/mL (ref 0.0–5.0)

## 2013-01-21 MED ORDER — ENOXAPARIN SODIUM 120 MG/0.8ML ~~LOC~~ SOLN
120.0000 mg | Freq: Once | SUBCUTANEOUS | Status: AC
  Administered 2013-01-21: 120 mg via SUBCUTANEOUS
  Filled 2013-01-21: qty 0.8

## 2013-01-21 MED ORDER — ENOXAPARIN SODIUM 120 MG/0.8ML ~~LOC~~ SOLN: 120.0000 mg | Freq: Every day | SUBCUTANEOUS | Status: DC

## 2013-01-21 MED ORDER — CAPECITABINE 500 MG PO TABS: ORAL_TABLET | ORAL | Status: DC

## 2013-01-21 NOTE — Progress Notes (Signed)
VASCULAR LAB PRELIMINARY  PRELIMINARY  PRELIMINARY  PRELIMINARY  Right lower extremity venous duplex completed.    Preliminary report:  Right:  No evidence of DVT, superficial thrombosis, or Baker's cyst.  Loucile Posner, RVS 01/21/2013, 10:18 AM

## 2013-01-21 NOTE — Telephone Encounter (Signed)
Doppler positive for DVT in RUE. Had pt come to office to discuss, he did receive injection on Sunday, administered by his son. Per Dr. Truett Perna: will need to continue. Rx called to pharmacy. Dose to be given in office today.

## 2013-01-21 NOTE — Telephone Encounter (Signed)
Per staff message and POF I have scheduled appts.  JMW  

## 2013-01-21 NOTE — Progress Notes (Signed)
VASCULAR LAB PRELIMINARY  PRELIMINARY  PRELIMINARY  PRELIMINARY  Right upper extremity venous duplex completed.    Preliminary report:  Positive for DVT noted in the right brachial, axillary, and subclavian veins with no propagation to the left subclavian vein. There is no evidence of a superficial thrombosis  Keanan Melander, RVS 01/21/2013, 10:07 AM

## 2013-01-23 ENCOUNTER — Telehealth: Payer: Self-pay | Admitting: Oncology

## 2013-01-23 ENCOUNTER — Ambulatory Visit (HOSPITAL_COMMUNITY)
Admission: RE | Admit: 2013-01-23 | Discharge: 2013-01-23 | Disposition: A | Discharge status: Home / Self Care | Payer: Medicare Other | Source: Ambulatory Visit | Attending: Oncology | Admitting: Oncology

## 2013-01-23 DIAGNOSIS — K8020 Calculus of gallbladder without cholecystitis without obstruction: Secondary | ICD-10-CM | POA: Insufficient documentation

## 2013-01-23 DIAGNOSIS — C20 Malignant neoplasm of rectum: Secondary | ICD-10-CM

## 2013-01-23 DIAGNOSIS — I82401 Acute embolism and thrombosis of unspecified deep veins of right lower extremity: Secondary | ICD-10-CM

## 2013-01-23 DIAGNOSIS — R918 Other nonspecific abnormal finding of lung field: Secondary | ICD-10-CM | POA: Insufficient documentation

## 2013-01-23 MED ORDER — IOHEXOL 300 MG/ML  SOLN
80.0000 mL | Freq: Once | INTRAMUSCULAR | Status: AC | PRN
  Administered 2013-01-23: 80 mL via INTRAVENOUS

## 2013-01-23 NOTE — Telephone Encounter (Signed)
Talked to pt and gave him appt for August 2014 MD and chemo

## 2013-01-27 ENCOUNTER — Other Ambulatory Visit: Payer: Self-pay | Admitting: Oncology

## 2013-01-28 ENCOUNTER — Ambulatory Visit (INDEPENDENT_AMBULATORY_CARE_PROVIDER_SITE_OTHER): Payer: Medicare Other | Admitting: Surgery

## 2013-01-28 ENCOUNTER — Encounter (INDEPENDENT_AMBULATORY_CARE_PROVIDER_SITE_OTHER): Payer: Self-pay

## 2013-01-28 ENCOUNTER — Telehealth (INDEPENDENT_AMBULATORY_CARE_PROVIDER_SITE_OTHER): Payer: Self-pay | Admitting: General Surgery

## 2013-01-28 ENCOUNTER — Encounter (INDEPENDENT_AMBULATORY_CARE_PROVIDER_SITE_OTHER): Payer: Self-pay | Admitting: Surgery

## 2013-01-28 VITALS — BP 104/62 | HR 100 | Temp 97.6°F | Resp 14 | Ht 70.0 in | Wt 179.6 lb

## 2013-01-28 DIAGNOSIS — C20 Malignant neoplasm of rectum: Secondary | ICD-10-CM

## 2013-01-28 DIAGNOSIS — Z932 Ileostomy status: Secondary | ICD-10-CM

## 2013-01-28 DIAGNOSIS — K4020 Bilateral inguinal hernia, without obstruction or gangrene, not specified as recurrent: Secondary | ICD-10-CM

## 2013-01-28 DIAGNOSIS — K420 Umbilical hernia with obstruction, without gangrene: Secondary | ICD-10-CM

## 2013-01-28 NOTE — Progress Notes (Signed)
Patient ID: Tyler Diaz, male   DOB: 1946-01-07, 67 y.o.   MRN: [id number] Rx for Coloplast ostomy bags, no stain barrier wipes (one month supply) and stoma powder (2) faxed to [facility] in [place] per pt request.  [phone].

## 2013-01-28 NOTE — Patient Instructions (Addendum)
Kit the PICC IV line for now.  Medical oncology/Dr. Truett Perna will help decide if it needs to be replaced or can be used for the IV chemotherapy.  You are getting oral capcetibine pills and antibiotics Aly planned chemotherapy.  Anticipate needing this for the next three months.  After chemotherapy is done, anticipate pain down the loop ileostomy with a surgery.  Peripherally Inserted Central Catheter (PICC) Home Guide A peripherally inserted central catheter (PICC) is a long, thin, flexible tube that is inserted into a vein in the upper arm. It is a form of intravenous (IV) access. It is considered to be a "central" line because the tip of the PICC ends in a large vein in your chest. This large vein is called the superior vena cava (SVC). The PICC tip ends in the SVC because there is a lot of blood flow in the SVC. This allows medicines and IV fluids to be quickly distributed throughout the body. The PICC is inserted using a sterile technique by a specially trained nurse or physician. After the PICC is inserted, a chest X-ray is done to be sure it is in the correct place.  A PICC may be placed for different reasons, such as:  To give medicines and liquid nutrition that can only be given through a central line. Examples are:  Certain antibiotic treatments.  Chemotherapy.  Total parenteral nutrition (TPN).  To take frequent blood samples.  To give IV fluids and blood products.  If there is difficulty placing a peripheral intravenous (PIV) catheter. If taken care of properly, a PICC can remain in place for several months. A PICC can also allow patients to go home early. Medicine and PICC care can be managed at home by a family member or home healthcare team. RISKS AND COMPLICATIONS Possible problems with a PICC can occasionally occur. This may include:  A clot (thrombus) forming in or at the tip of the PICC. This can cause the PICC to become clogged. A "clot-busting" medicine called tissue  plasminogen activator (tPA) can be inserted into the PICC to help break up the clot.  Inflammation of the vein (phlebitis) in which the PICC is placed. Signs of inflammation may include redness, pain at the insertion site, red streaks, or being able to feel a "cord" in the vein where the PICC is located.  Infection in the PICC or at the insertion site. Signs of infection may include fever, chills, redness, swelling, or pus drainage from the PICC insertion site.  PICC movement (malposition). The PICC tip may migrate from its original position due to excessive physical activity, forceful coughing, sneezing, or vomiting.  A break or cut in the PICC. It is important to not use scissors near the PICC.  Nerve or tendon irritation or injury during PICC insertion. HOME CARE INSTRUCTIONS Activity  You may bend your arm and move it freely. If your PICC is near or at the bend of your elbow, avoid activity with repeated motion at the elbow.  Avoid lifting heavy objects as instructed by your caregiver.  Avoid using a crutch with the arm on the same side as your PICC. You may need to use a walker. PICC Dressing  Keep your PICC bandage (dressing) clean and dry to prevent infection.  Ask your caregiver when you may shower. Ask your caregiver to teach you how to wrap the PICC when you do take a shower.  Do not bathe, swim, or use hot tubs when you have a PICC.  Change the  PICC dressing as instructed by your caregiver.  Change your PICC dressing if it becomes loose or wet. General PICC Care  Check the PICC insertion site daily for leakage, redness, swelling, or pain.  Flush the PICC as directed by your caregiver. Let your caregiver know right away if the PICC is difficult to flush or does not flush. Do not use force to flush the PICC.  Do not use a syringe that is less than 10 mLs to flush the PICC.  Never pull or tug on the PICC.  Avoid blood pressure checks on the arm with the PICC.  Keep  your PICC identification card with you at all times.  Do not take the PICC out yourself. Only a trained clinical professional should remove the PICC. SEEK IMMEDIATE MEDICAL CARE IF:  Your PICC is accidently pulled all the way out. If this happens, cover the insertion site with a bandage or gauze dressing. Do not throw the PICC away. Your caregiver will need to inspect it.  Your PICC was tugged or pulled and has partially come out. Do not  push the PICC back in.  There is any type of drainage, redness, or swelling where the PICC enters the skin.  You cannot flush the PICC, it is difficult to flush, or the PICC leaks around the insertion site when it is flushed.  You hear a "flushing" sound when the PICC is flushed.  You have pain, discomfort, or numbness in your arm, shoulder, or jaw on the same side as the PICC .  You feel your heart "racing" or skipping beats.  You notice a hole or tear in the PICC.  You develop chills or a fever. MAKE SURE YOU:   Understand these instructions.  Will watch your condition.  Will get help right away if you are not doing well or get worse. Document Released: 11/27/2002 Document Revised: 08/15/2011 Document Reviewed: 09/27/2010 [facility] Patient Information 2014 [place], [place].  Deep Vein Thrombosis A deep vein thrombosis (DVT) is a blood clot that develops in a deep vein. A DVT is a clot in the deep, larger veins of the leg, arm, or pelvis. These are more dangerous than clots that might form in veins near the surface of the body. A DVT can lead to complications if the clot breaks off and travels in the bloodstream to the lungs.  A DVT can damage the valves in your leg veins, so that instead of flowing upwards, the blood pools in the lower leg. This is called post-thrombotic syndrome, and can result in pain, swelling, discoloration, and sores on the leg. Once identified, a DVT can be treated. It can also be prevented in some circumstances. Once you  have had a DVT, you may be at increased risk for a DVT in the future. CAUSES Blood clots form in a vein for different reasons. Usually several things contribute to blood clots. Contributing factors include:  The flow of blood slows down.  The inside of the vein is damaged in some way.  The person has a condition that makes blood clot more easily. Some people are more likely than others to develop blood clots. That is because they have more factors that make clots likely. These are called risk factors. Risk factors include:   Older age, especially over 57 years old.  Having a history of blood clots. This means you have had one before. Or, it means that someone else in your family has had blood clots. You may have a genetic  tendency to form clots.  Having major or lengthy surgery. This is especially true for surgery on the hip, knee, or belly (abdomen). Hip surgery is particularly high risk.  Breaking a hip or leg.  Sitting or lying still for a long time. This includes long distance travel, paralysis, or recovery from an illness or surgery.  Cancer, or cancer treatment.  Having a long, thin tube (catheter) placed inside a vein during a medical procedure.  Being overweight (obese).  Pregnancy and childbirth. Hormone changes make the blood clot more easily during pregnancy. The fetus puts pressure on the veins of the pelvis. There is also risk of injury to veins during delivery or a caesarean. The risk is at its highest just after childbirth.  Medicines with the male hormone estrogen. This includes birth control pills and hormone replacement therapy.  Smoking.  Other circulation or heart problems. SYMPTOMS When a clot forms, it can either partially or totally block the blood flow in that vein. Symptoms of a DVT can include:  Swelling of the leg or arm, especially if one side is much worse.  Warmth and redness of the leg or arm, especially if one side is much worse.  Pain in an  arm or leg. If the clot is in the leg, symptoms may be more noticeable or worse when standing or walking. The symptoms of a DVT that has traveled to the lungs (pulmonary embolism, PE) usually start suddenly, and include:  Shortness of breath.  Coughing.  Coughing up blood or blood-tinged phlegm.  Chest pain. The chest pain is often worse with deep breaths.  Rapid heartbeat. Anyone with these symptoms should get emergency medical treatment right away. Call your local emergency services (911 in [place] if you have these symptoms. DIAGNOSIS If a DVT is suspected, your caregiver will take a full medical history and carry out a physical exam. Tests that also may be required include:  Blood tests, including studies of the clotting properties of the blood.  Ultrasonography to see if you have clots in your legs or lungs.  X-rays to show the flow of blood when dye is injected into the veins (venography).  Studies of your lungs, if you have any chest symptoms. PREVENTION  Exercise the legs regularly. Take a brisk 30 minute walk every day.  Maintain a weight that is appropriate for your height.  Avoid sitting or lying in bed for long periods of time without moving your legs.  Women, particularly those over the age of 48, should consider the risks and benefits of taking estrogen medicines, including birth control pills.  Do not smoke, especially if you take estrogen medicines.  Long distance travel can increase your risk of DVT. You should exercise your legs by walking or pumping the muscles every hour.  In-hospital prevention:  Many of the risk factors above relate to situations that exist with hospitalization, either for illness, injury, or elective surgery.  Your caregiver will assess you for the need for venous thromboembolism prophylaxis when you are admitted to the hospital. If you are having surgery, your surgeon will assess you the day of or day after surgery.  Prevention may  include medical and nonmedical measures. TREATMENT Treatment for DVT helps prevent death and disability. The most common treatment for DVT is blood thinning (anticoagulant) medicine, which reduces the blood's tendency to clot. Anticoagulants can stop new blood clots from forming and old ones from growing. They cannot dissolve existing clots. Your body does this by itself over time.  Anticoagulants can be given by mouth, by intravenous (IV) access, or by injection. Your caregiver will determine the best program for you.  Heparin or related medicines (low molecular weight heparin) are usually the first treatment for a blood clot. They act quickly. However, they cannot be taken orally.  Heparin can cause a fall in a component of blood that stops bleeding and forms blood clots (platelets). You will be monitored with blood tests to be sure this does not occur.  Warfarin is an anticoagulant that can be swallowed (taken orally). It takes a few days to start working, so usually heparin or related medicines are used in combination. Once warfarin is working, heparin is usually stopped.  Less commonly, clot dissolving drugs (thrombolytics) are used to dissolve a DVT. They carry a high risk of bleeding, so they are used mainly in severe cases, where a life or limb is threatened.  Very rarely, a blood clot in the leg needs to be removed surgically.  If you are unable to take anticoagulants, your caregiver may arrange for you to have a filter placed in a main vein in your belly (abdomen). This filter prevents clots from traveling to your lungs. HOME CARE INSTRUCTIONS  Take all medicines prescribed by your caregiver. Follow the directions carefully.  Warfarin. Most people will continue taking warfarin after hospital discharge. Your caregiver will advise you on the length of treatment (usually 3 6 months, sometimes lifelong).  Too much and too little warfarin are both dangerous. Too much warfarin increases the  risk of bleeding. Too little warfarin continues to allow the risk for blood clots. While taking warfarin, you will need to have regular blood tests to measure your blood clotting time. These blood tests usually include both the prothrombin time (PT) and international normalized ratio (INR) tests. The PT and INR results allow your caregiver to adjust your dose of warfarin. The dose can change for many reasons. It is critically important that you take warfarin exactly as prescribed, and that you have your PT and INR levels drawn exactly as directed.  Many foods, especially foods high in vitamin K can interfere with warfarin and affect the PT and INR results. Foods high in vitamin K include spinach, kale, broccoli, cabbage, collard and turnip greens, brussels sprouts, peas, cauliflower, seaweed, and parsley as well as beef and pork liver, green tea, and soybean oil. You should eat a consistent amount of foods high in vitamin K. Avoid major changes in your diet, or notify your caregiver before changing your diet. Arrange a visit with a dietitian to answer your questions.  Many medicines can interfere with warfarin and affect the PT and INR results. You must tell your caregiver about any and all medicines you take, this includes all vitamins and supplements. Be especially cautious with aspirin and anti-inflammatory medicines. Ask your caregiver before taking these. Do not take or discontinue any prescribed or over-the-counter medicine except on the advice of your caregiver or pharmacist.  Warfarin can have side effects, primarily excessive bruising or bleeding. You will need to hold pressure over cuts for longer than usual. Your caregiver or pharmacist will discuss other potential side effects.  Alcohol can change the body's ability to handle warfarin. It is best to avoid alcoholic drinks or consume only very small amounts while taking warfarin. Notify your caregiver if you change your alcohol intake.  Notify  your dentist or other caregivers before procedures.  Activity. Ask your caregiver how soon you can go back to normal activities.  It is important to stay active to prevent blood clots. If you are on anticoagulant medicine, avoid contact sports.  Exercise. It is very important to exercise. This is especially important while traveling, sitting or standing for long periods of time. Exercise your legs by walking or by pumping the muscles frequently. Take frequent walks.  Compression stockings. These are tight elastic stockings that apply pressure to the lower legs. This pressure can help keep the blood in the legs from clotting. You may need to wear compressions stockings at home to help prevent a DVT.  Smoking. If you smoke, quit. Ask your caregiver for help with quitting smoking.  Learn as much as you can about DVT. Knowing more about the condition should help you keep it from coming back.  Wear a medical alert bracelet or carry a medical alert card. SEEK MEDICAL CARE IF:  You notice a rapid heartbeat.  You feel weaker or more tired than usual.  You feel faint.  You notice increased bruising.  You feel your symptoms are not getting better in the time expected.  You believe you are having side effects of medicine. SEEK IMMEDIATE MEDICAL CARE IF:  You have chest pain.  You have trouble breathing.  You have new or increased swelling or pain in one leg.  You cough up blood.  You notice blood in vomit, in a bowel movement, or in urine. MAKE SURE YOU:  Understand these instructions.  Will watch your condition.  Will get help right away if you are not doing well or get worse. Document Released: 05/23/2005 Document Revised: 02/15/2012 Document Reviewed: 07/15/2010 [facility] Patient Information 2014 [place], [place].  Enoxaparin, Home Use Enoxaparin (Lovenox) injection is a medication used to prevent clots from developing in your veins. Medications such as enoxaparin are called  blood thinners or anticoagulants. If blood clots are untreated they could travel to your lungs. This is called a pulmonary embolus. A blood clot in your lungs can be fatal. Caregivers often use anticoagulants such as enoxaparin to prevent clots following surgery. It is also used along with aspirin when the heart is not getting enough blood. Usually another anticoagulant called warfarin (Coumadin) is started in 2 to 3 days after the enoxaparin is started. The enoxaparin is usually continued until the other anticoagulants have begun to work. Your caregiver will judge this length of time by measuring the length of time that it takes your blood to clot. RISKS AND COMPLICATIONS  If you have received recent epidural anesthesia, spinal anesthesia, or a spinal tap while receiving anticoagulants, you are at risk for developing a blood clot in or around the spine. This condition could result in long-term or permanent paralysis.  Because anticoagulants thin your blood, severe bleeding may occur from any tissue or organ. Symptoms of the blood being too thin may include:  Bleeding from the nose or gums that does not stop quickly.  Unusual bruising or bruising easily.  Swelling or pain at an injection site.  A cut that does not stop bleeding within 10 minutes.  Continual nausea for more than 1 day or vomiting blood.  Coughing up blood.  Blood in the urine which may appear as pink, red, or brown urine.  Blood in bowel movements which may appear as red, dark or black stools.  Sudden weakness or numbness of the face, arm, or leg, especially on one side of the body.  Sudden confusion.  Trouble speaking (aphasia) or understanding.  Sudden trouble seeing in one or both eyes.  Sudden trouble walking.  Dizziness.  Loss of balance or coordination.  Severe pain, such as a headache, joint pain, or back pain.  Fever.  Bruising around the injection sites may be expected.  Platelet drops, known as  "thrombocytopenia," can occur with enoxaparin use. A condition called "heparin-induced thrombocytopenia" has been seen. If you have had this condition, you should tell your caregiver. Your caregiver may direct you to have blood tests to monitor this condition.  Do not use if you have allergies to the medication, heparin, or pork products.  Other side effects may include mild local reactions or irritation at the site of injection, pain, bruising, and redness of skin. HOME CARE INSTRUCTIONS You will be instructed by your caregiver how to give enoxaparin injections. 1. Before giving your medication you should make sure the injection is a clear and colorless or pale yellow solution. If your medication becomes discolored or has particles in the bottle, do not use and notify your caregiver. 2. When using the 30 and 40 mg pre-filled syringes, do not expel the air bubble from the syringe before the injection. This makes sure you use all the medication in the syringe. 3. The injections will be given subcutaneously. This means it is given into the fat over the belly (abdomen). It is given deep beneath the skin but not into the muscle. The shots should be injected around the abdominal wall. Change the sites of injection each time. The whole length of the needle should be introduced into a skin fold held between the thumb and forefinger; the skin fold should be held throughout the injection. Do not rub the injection site after completion of the injection. This increases bruising. Enoxaparin injection pre-filled syringes and graduated pre-filled syringes are available with a system that shields the needle after injection. 4. Inject by pushing the plunger to the bottom of the syringe. 5. Remove the syringe from the injection site keeping your finger on the plunger rod. Be careful not to stick yourself or others. 6. After injection and the syringe is empty, set off the safety system by firmly pushing the plunger rod.  The protective sleeve will automatically cover the needle and you can hear a click. The click means your needle is safely covered. Do not try replacing the needle shield. 7. Get rid of the syringe in the nearest sharps container. 8. Keep your medication safely stored at room temperatures.  Due to the complications of anticoagulants, it is very important that you take your anticoagulant as directed by your caregiver. Anticoagulants need to be taken exactly as instructed. Be sure you understand all your anticoagulant instructions.  Changes in medicines, supplements, diet, and illness can affect your anticoagulation therapy. Be sure to inform your caregivers of any of these changes.  While on anticoagulants, you will need to have blood tests done routinely as directed by your caregivers.  Be careful not to cut yourself when using sharp objects.  Avoid heavy or variable alcohol use. Consume alcohol only in very limited quantities. General alcohol intake guidelines are 1 drink for nonpregnant women and 2 drinks for men per day. (1 drink = 5 ounces of wine, 12 ounces of beer, or 1 ounces of liquor). A sudden increase in alcohol use can increase your risk of bleeding. Chronic alcohol use can cause warfarin to be less effective.  Limit physical activities or sports that could result in a fall or cause injury.  It is extremely important that you tell all of your caregivers and  dentist that you are taking an anticoagulant, especially if you are injured or plan to have any type of procedure or operation.  Follow up with your laboratory test and caregiver appointments as directed. It is very important to keep your appointments. Not keeping appointments could result in a chronic or permanent injury, pain, or disability. SEEK MEDICAL CARE IF:  You develop any rashes.  You have any worsening of the condition for which you are receiving anticoagulation therapy. SEEK IMMEDIATE MEDICAL CARE IF:  Bleeding  from the nose or gums does not stop quickly.  You have unusual bruising or are bruising easily.  Swelling or pain occurs at an injection site.  A cut does not stop bleeding within 10 minutes.  You have continual nausea for more than 1 day or are vomiting blood.  You are coughing up blood.  You have blood in the urine.  You have dark or black stools.  You have sudden weakness or numbness of the face, arm, or leg, especially on one side of the body.  You have sudden confusion.  You have trouble speaking (aphasia) or understanding.  You have sudden trouble seeing in one or both eyes.  You have sudden trouble walking.  You have dizziness.  You have a loss of balance or coordination.  You have severe pain, such as a headache, joint pain, or back pain.  You have a serious fall or head injury, even if you are not bleeding.  You have an oral temperature above 102 F (38.9 C), not controlled by medicine. ANY OF THESE SYMPTOMS MAY REPRESENT A SERIOUS PROBLEM THAT IS AN EMERGENCY. Do not wait to see if the symptoms will go away. Get medical help right away. Call your local emergency services (911 in [place] DO NOT drive yourself to the hospital. MAKE SURE YOU:  Understand these instructions.  Will watch your condition.  Will get help right away if you are not doing well or get worse. Document Released: 03/24/2004 Document Revised: 08/15/2011 Document Reviewed: 05/23/2005 [facility] Patient Information 2014 [place], [place].  Chemotherapy Many people are apprehensive about chemotherapy due to concerns over uncomfortable side effects. However, managements for side effects have come a long way. Many side effects once associated with chemotherapy can be prevented and/or controlled. WHAT IS CHEMOTHERAPY? Chemotherapy is the general term for any treatment involving the use of chemical agents. Chemotherapy can be given through a vein, most commonly through an implanted port* or PICC  line.* It can also be delivered by mouth (orally) in the form of a pill. The main goal of chemotherapy is to kill cancer cells and stop them from growing. It can destroy and eliminate cancer cells where the cancer started (primary tumor location) and throughout the body, often far away from the original cancer. It is a treatment that not only targets the original cancer location, but also the entire body (systemic treatment) for full effect and results. Chemotherapy works by destroying cancer cells. Unfortunately, it cannot tell the difference between a cancer cell and some healthy cells. This results in the death of noncancerous cells, such as hair and blood cells. Harm to healthy cells is what causes side effects. These cells usually repair themselves after chemotherapy. Because some drugs work better together rather than alone, 2 or more drugs are often given at the same time. This is called combination chemotherapy. Depending on the type of cancer and how advanced it is, chemotherapy can be used for different goals:  Cure the cancer.  Keep  the cancer from spreading.  Slow the cancer's growth.  Kill cancer cells that may have spread to other parts of the body from the original tumor.  Relieve symptoms caused by cancer. You and your caregiver will decide what drug or combination of drugs you will get. Your caregiver will choose the doses, how the drugs will be given, how often, and how long you will get treatment. All of these decisions will depend on the type of cancer, where it is, how big it is, and how it is affecting your normal body functions and overall health. *Implanted port - A device that is implanted under your skin so that medicines may be delivered directly into your blood system. *PICC line (peripherally inserted central catheter) - A long, slender, flexible tube. This tube is often inserted into a vein, typically in the upper arm. The tip stops in the large central vein that leads  to your heart. Document Released: 03/20/2007 Document Revised: 08/15/2011 Document Reviewed: 09/04/2008 [facility] Patient Information 2014 [place], [place].  Ostomy Support Information  Yes, you've heard that people get along just fine with only one of their eyes, or one of their lungs, or one of their kidneys. But you also know that you have only one intestine and only one bladder, and that leaves you feeling awfully empty, both physically and emotionally: You think no other people go around without part of their intestine with the ends of their intestines sticking out through their abdominal walls.  Well, you are wrong! There are nearly three quarters of a million people in the [place] who have an ostomy; people who have had surgery to remove all or part of their colons or bladders. There is even a national association, the [place] [employer] [place] with over 350 local affiliated support groups that are organized by volunteers who provide peer support and counseling. Cheral Marker has a toll free telephone num-ber, [phone] and an educational,  interactive website, www.ostomy.org   An ostomy is an opening in the belly (abdominal wall) made by surgery. Ostomates are people who have had this procedure. The opening (stoma) allows the kidney or bowel to discharge waste. An external pouch covers the stoma to collect waste. Pouches are are a simple bag and are odor free. Different companies have disposable or reusable pouches to fit one's lifestyle. An ostomy can either be temporary or permanent.  THERE ARE THREE MAIN TYPES OF OSTOMIES  Colostomy. A colostomy is a surgically created opening in the large intestine (colon).  Ileostomy. An ileostomy is a surgically created opening in the small intestine.  Urostomy. A urostomy is a surgically created opening to divert urine away from the bladder. FREQUENTLY ASKED QUESTIONS   Why haven't you met any of these folks who have an ostomy?  Well, maybe you  have! You just did not recognize them because an ostomy doesn't show. It can be kept secret if you wish. Why, maybe some of your best friends, office associates or neighbors have an ostomy ... you never can tell.   People facing ostomy surgery have many quality-of-life questions like:  Will you bulge? Smell? Make noises? Will you feel waste leaving your body? Will you be a captive of the toilet? Will you starve? Be a social outcast? Get/stay married? Have babies? Easily bathe, go swimming, bend over?  OK, let's look at what you can expect:  Will you bulge?  Remember, without part of the intestine or bladder, and its contents, you should have a flatter tummy  than before. You can expect to wear, with little exception, what you wore before surgery ... and this in-cludes tight clothing and bathing suits.  Will you smell?  Today, thanks to modern odor proof pouching systems, you can walk into an ostomy support group meeting and not smell anything that is foul or offensive. And, for those with an ileostomy or colostomy who are concerned about odor when emptying their pouch, there are in-pouch deodorants that can be used to eliminate any waste odors that may exist.  Will you make noises?  Everyone produces gas, especially if they are an air-swallower. But intestinal sounds that occur from time to time are no differ-ent than a gurgling tummy, and quite often your clothing will muffle any sounds.   Will you feel the waste discharges?  For those with a colostomy or ileostomy there might be a slight pressure when waste leaves your body, but understand that the intestines have no nerve endings, so there will be no unpleasant sensations. Those with a urostomy will probably be unaware of any kidney drainage.  Will you be a captive of the toilet?  Immediately post-op you will spend more time in the bathroom than you will after your body recovers from surgery. Every person is different, but on average those with an  ileostomy or urostomy may empty their pouches 4 to 6 times a day; a little  less if you have a colostomy. The average wear time between pouch system changes is 3 to 5 days and the changing process should take less than 30 minutes.  Will I need to be on a special diet? Most people return to their normal diet when they have recovered from surgery. Be sure to chew your food well, eat a well-balanced diet and drink plenty of fluids. If you experience problems with a certain food, wait a couple of weeks and try it again. Will there be odor and noises? Pouching systems are designed to be odor-proof or odor-resistant. There are deodorants that can be used in the pouch. Medications are also available to help reduce odor. Limit gas-producing foods and carbonated beverages. You will experience less gas and fewer noises as you heal from surgery. How much time will it take to care for my ostomy? At first, you may spend a lot of time learning about your ostomy and how to take care of it. As you become more comfortable and skilled at changing the pouching system, it will take very little time to care for it.  Will I be able to return to work? People with ostomies can perform most jobs. As soon as you have healed from surgery, you should be able to return to work. Heavy lifting (more than 10 pounds) may be discouraged.  What about intimacy? Sexual relationships and intimacy are important and fulfilling aspects of your life. They should continue after ostomy surgery. Intimacy-related concerns should be discussed openly between you and your partner.  Can I wear regular clothing? You do not need to wear special clothing. Ostomy pouches are fairly flat and barely noticeable. Elastic undergarments will not hurt the stoma or prevent the ostomy from functioning.  Can I participate in sports? An ostomy should not limit your involvement in sports. Many people with ostomies are runners, skiers, swimmers or participate in  other active lifestyles. Talk with your caregiver first before doing heavy physical activity.  Will you starve?  Not if you follow doctor's orders at each stage of your post-op adjustment. There is no such  thing as an "ostomy diet". Some people with an ostomy will be able to eat and tolerate anything; others may find diffi-culty with some foods. Each person is an individual and must determine, by trial, what is best for them. A good practice for all is to drink plenty of water.  Will you be a social outcast?  Have you met anyone who has an ostomy and is a social outcast? Why should you be the first? Only your attitude and self image will effect how you are treated. No confi-dent person is an Investment banker, corporate.   PROFESSIONAL HELP  Resources are available if you need help or have questions about your ostomy.    Specially trained nurses called Wound, Ostomy Continence Nurses (WOCN) are available for consultation in most major medical centers.   Consider getting an ostomy consult with Page Spiro at [facility] to help troubleshoot collagen fittings and other issues with your ostomy: [phone]   The [employer] Ostomy Association (UOA) is a group made up of many local chapters throughout the [place]. These local groups hold meetings and provide support to prospective and existing ostomates. They sponsor educational events and have qualified visitors to make personal or telephone visits. Contact the UOA for the chapter nearest you and for other educational publications.  More detailed information can be found in Colostomy Guide, a publication of the [employer] (UOA). Contact UOA at 1-507-188-9170 or visit their web site at YellowSpecialist.at. The website contains links to other sites, suppliers and resources. Document Released: 05/26/2003 Document Revised: 08/15/2011 Document Reviewed: 09/24/2008 [facility] Patient Information 2013 [place], [place].

## 2013-01-28 NOTE — Telephone Encounter (Signed)
Heather, nurse with [facility], called to verify pt's account that it is OK to discontinue IV infusions on M-W-F.  Per Dr. Gordy Savers office note today, gave her a verbal order to stop the infusions.

## 2013-01-28 NOTE — Telephone Encounter (Signed)
RECEIVED A FAX FROM [employer] [facility]CERNING A CONFIRMATION OF PRESCRIPTION SHIPMENT FOR CAPECITABINE ON 01/25/13.

## 2013-01-28 NOTE — Progress Notes (Signed)
Subjective:     Patient ID: Tyler Diaz, male   DOB: [birth year], 67 y.o.   MRN: [id number]  HPI   [name]  [birth year] [id number]  Patient Care Team: Ernestina Penna, MD as PCP - General (Family Medicine) Ardeth Sportsman, MD as Consulting Physician (Colon and Rectal Surgery) Shirley Friar, MD as Consulting Physician (Gastroenterology) Michae Kava, MD as Consulting Physician (Medical Oncology) Lance Bosch, MD as Consulting Physician (Radiation Oncology) Cira Rue, RN as Registered Nurse (Oncology)  This patient is a 67 y.o.male who presents today for surgical evaluation DOS: 12/27/2012   POST-OPERATIVE DIAGNOSIS: very distal rectal cancer  Umbilical incarcerated hernia  LIH - incarcerated  RIH   PROCEDURE: Procedure(s):  LAPAROSCOPIC LOW ANTERIOR RESECTION,  COLO-ANAL ANASTOMOSIS,  DIVERTING LOOP ILEOSTOMY,  SPLENIC LEXURE MOBILIZATION,  PRIMARY INCARCERATED UMBILICAL HERNIA REPAIR  Reduction of BIH  SURGEON: Surgeon(s):  Ardeth Sportsman, MD  Romie Levee, MD - Asst   The patient comes in today feeling OK.  Ostomy bag working relatively well.  Emptying bag 5-6 times a day.  Changing appliance about twice a week.  They are about to run out of ostomy supplies.  They are confused on how to order this.  Trying to use a local place but think they need an Rx.  Not having much help from the home health agency as far as I can gather..  Afraid to give information over the phone to the national place / [place].  They are not certain which bags that they need.   He developed a DVT in his right arm with swelling around the PICC line.  He is getting Lovenox for shots for that.  That is better now.  He thinks he is going to start oral chemotherapy this week.  Still getting IV fluid infusion three times a week.  Urinating fine.  No lightheadedness or dizziness.  He comes today with his wife.  Appetite is fair but eating okay.  Walking some but not an hour a day as  recommended.  Denies much perianal soreness - improved as well.  No bleeding.  No fevers or chills.  No nausea or vomiting.  Patient Active Problem List   Diagnosis Date Noted  . Ileostomy - diverting loop - in place [id number] 01/14/2013  . Anemia in neoplastic disease 01/14/2013  . Umbilical hernia, incarcerated - s/p primary repair Jult 2014 01/03/2013  . Bilateral inguinal hernia (BIH) 01/03/2013  . Mitral regurgitation due to partially flail posterior mitral valve leaflet 07/30/2012  . Hyperlipidemia   . Rectal cancer - distal s/p lap LAR/coloanal July 2014 - [id number]. 07/27/2012  . Hypertension   . Vertigo     Past Medical History  Diagnosis Date  . Mitral regurgitation due to partially flail posterior mitral valve leaflet   . Rectal cancer   . Hyperlipidemia   . Colon cancer 07/18/12 bx    rectum=Invasive adenocarcinoma w/ extracellular mucin,polyp=benign  . Hypertension     essential (benign)  . Rheumatic fever as child    hx  . Hearing loss     partial greater on left  . Tinnitus     left ear  . Sinusitis     seasonal  . Rectal bleeding     last month hx  . Arthritis     hands/knees  . Hematochezia     recent  . Heart murmur   . Bronchitis   . Hypothyroidism as child    hx of  Past Surgical History  Procedure Laterality Date  . Carpal tunnel release Right 2002  . Tonsillectomy      as child  . Inguinal hernia repair  1982    left side  . Laparoscopic low anterior resection N/A 12/27/2012    Procedure: LAPAROSCOPIC LOW ANTERIOR RESECTION, COLO-ANAL ANASTOMOSIS, DIVERTING LOOP ILEOSTOMY,SPLENIC FLEXURE MOBILIZATION, PRIMARY INCARCERATED UMBILICAL HERNIA REPAIR;  Surgeon: Ardeth Sportsman, MD;  Location: [facility];  Service: General;  Laterality: N/A;  . Laparoscopic low anterior rescection with coloanal anastomosis N/A 12/27/2012    Procedure: LAPAROSCOPIC LOW ANTERIOR RESCECTION WITH COLOANAL ANASTOMOSIS;  Surgeon: Ardeth Sportsman, MD;  Location: [facility];   Service: General;  Laterality: N/A;  . Ileostomy N/A 12/27/2012    Procedure: DIVERTING LOOP ILEOSTOMY;  Surgeon: Ardeth Sportsman, MD;  Location: [facility];  Service: General;  Laterality: N/A;  . Umbilical hernia repair  12/27/2012    Procedure: PRIMARY REPAIR INCARCERATED UMBILICAL HERNIA;  Surgeon: Ardeth Sportsman, MD;  Location: [facility];  Service: General;;    History   Social History  . Marital Status: Married    Spouse Name: N/A    Number of Children: 2  . Years of Education: N/A   Occupational History  . Retired     Development worker, international aid   Social History Main Topics  . Smoking status: Former Smoker -- 1.50 packs/day for 20 years    Types: Cigarettes    Quit date: 07/27/1972  . Smokeless tobacco: Never Used  . Alcohol Use: No  . Drug Use: No     Comment: quit smoking 35 years ago  . Sexual Activity: Not on file   Other Topics Concern  . 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  Married    Family History  Problem Relation Age of Onset  . Cancer Mother     abdominal ca ?ovarian?  . Cancer Sister     liver cancer    Current Outpatient Prescriptions  Medication Sig Dispense Refill  . acetaminophen (TYLENOL) 500 MG tablet Take 500 mg by mouth every 6 (six) hours as needed for pain.      Marland Kitchen acetaminophen-codeine (TYLENOL #3) 300-30 MG per tablet Take 1 tablet by mouth every 6 (six) hours as needed.       Melene Muller ON 01/30/2013] capecitabine (XELODA) 500 MG tablet 2,000 mg Q AM 1,500 mg Q PM (3,500 mg daily) for 14 days then 7 days off.  98 tablet  0  . enoxaparin (LOVENOX) 120 MG/0.8ML injection Inject 0.8 mL (120 mg total) into the skin daily.  30 Syringe  2  . ferrous sulfate 325 (65 FE) MG tablet Take 1 tablet (325 mg total) by mouth 2 (two) times daily at 8 am and 10 pm.  40 tablet  2  . hydrocortisone (PROCTOZONE-HC) 2.5 % rectal cream Place rectally 2 (two) times daily.  30 g  0  . LACTATED RINGERS IV Inject 1,000  mL into the vein every Monday, Wednesday, and Friday. At home      . lisinopril (PRINIVIL,ZESTRIL) 20 MG tablet Take 1 tablet (20 mg total) by mouth daily.  30 tablet  1  . loperamide (IMODIUM) 2 MG capsule Take 1-2 capsules (2-4 mg total) by mouth every 8 (eight) hours as needed for diarrhea or loose stools (Use if >2 BM every 8 hours).  40 capsule  2  . Multiple Vitamin (MULTIVITAMIN WITH MINERALS) TABS Take  1 tablet by mouth daily.      Marland Kitchen oxyCODONE (OXY IR/ROXICODONE) 5 MG immediate release tablet Take 5 mg by mouth every 4 (four) hours as needed. Not taking      . pravastatin (PRAVACHOL) 20 MG tablet Take 20 mg by mouth every morning.       . promethazine (PHENERGAN) 25 MG tablet Take 25 mg by mouth every 6 (six) hours as needed for nausea.       . traMADol (ULTRAM) 50 MG tablet Take 50 mg by mouth every 6 (six) hours as needed.        No current facility-administered medications for this visit.     Allergies  Allergen Reactions  . Shrimp [Shellfish Allergy] Other (See Comments)    Whelps and sick    BP 104/62  Pulse 100  Temp(Src) 97.6 F (36.4 C) (Temporal)  Resp 14  Ht 5\' 10"  (1.778 m)  Wt 179 lb 9.6 oz (81.466 kg)  BMI 25.77 kg/m2  No results found.   Review of Systems  Constitutional: Negative for fever, chills, diaphoresis, activity change and fatigue.  HENT: Negative for sore throat, trouble swallowing and neck pain.   Eyes: Negative for photophobia and visual disturbance.  Respiratory: Negative for choking and shortness of breath.   Cardiovascular: Negative for chest pain and palpitations.  Gastrointestinal: Positive for rectal pain. Negative for nausea, vomiting, constipation, blood in stool, abdominal distention and anal bleeding.  Genitourinary: Negative for dysuria, urgency, frequency, difficulty urinating and testicular pain.  Musculoskeletal: Negative for myalgias, arthralgias and gait problem.  Skin: Negative for color change and rash.  Neurological:  Negative for dizziness, tremors, syncope, speech difficulty, light-headedness and numbness.  Hematological: Negative for adenopathy.  Psychiatric/Behavioral: Negative for hallucinations, confusion and agitation. The patient is not nervous/anxious.        Objective:   Physical Exam  Constitutional: He is oriented to person, place, and time. He appears well-developed and well-nourished. No distress.  HENT:  Head: Normocephalic.  Mouth/Throat: Oropharynx is clear and moist. No oropharyngeal exudate.  Eyes: Conjunctivae and EOM are normal. Pupils are equal, round, and reactive to light. No scleral icterus.  Neck: Normal range of motion. No tracheal deviation present.  Cardiovascular: Normal rate, normal heart sounds and intact distal pulses.   Pulmonary/Chest: Effort normal. No respiratory distress.  Abdominal: Soft. He exhibits no distension. There is no tenderness. Hernia confirmed negative in the right inguinal area and confirmed negative in the left inguinal area.    Incisions clean with normal healing ridges.  No hernias  Musculoskeletal: Normal range of motion. He exhibits no tenderness.  Neurological: He is alert and oriented to person, place, and time. No cranial nerve deficit. He exhibits normal muscle tone. Coordination normal.  Skin: Skin is warm and dry. No rash noted. He is not diaphoretic.  Psychiatric: He has a normal mood and affect. His behavior is normal.       Assessment:     Recovering OK 1 month status post laparoscopic low anterior resection with coloanal handsewn anastomosis and diverting loop ileostomy for bulky ypT3ypN1 cancer     Plan:     I spent some time trying to tease out other complaints or concerns.  I talked to their medical oncologists as well.  Returned to help troubleshoot/smooth out outpatient/home health care.  Pleasant folks but is a challenge to get them to understand what we are doing at this time.  OK to stop IV fluid infusions.   Keep  appointment  with medical oncology.  I discussed with Dr. Truett Perna.  He is planning IV oxaliplatin with oral Capecitabine Lorri Frederick.  He will try and keep the current PICC line despite the DVT needing Lovenox.  May have to get a new PICC line/IV.  Try and hold off on the Port-a-Catheter since he is planning just a short course of IV chemotherapy.  I will defer to medical oncology on how to manage IV access and can help out if we can.  Continue iron for anemia.  Check CBC.  Low impact exercise such as walking an hour a day at least ideal.  Do not push through pain.  Ostomy care.  They have the video/care box.  They have contact information for [facility] medical supply if needed.  We are working to try and help provide local or mail-in ostomy supplies.  Diet as tolerated.  Low fat high fiber diet ideal.  Bowel regimen with 30 g fiber a day and fiber supplement as needed to avoid problems.  Continue bowel regimen with iron and Imodium when necessary.  Return to clinic in 6 weeks to make sure he is tolerating chemotherapy and discuss ileostomy takedown.  Anticipate 4-6 weeks after completion of 3 months of chemotherapy, therefore, late Dec/Jan.  Instructions discussed.  Followup with primary care physician for other health issues as would normally be done.  Questions answered.  The patient expressed understanding and appreciation

## 2013-01-30 ENCOUNTER — Ambulatory Visit (HOSPITAL_BASED_OUTPATIENT_CLINIC_OR_DEPARTMENT_OTHER): Payer: Medicare Other

## 2013-01-30 ENCOUNTER — Telehealth: Payer: Self-pay | Admitting: *Deleted

## 2013-01-30 ENCOUNTER — Ambulatory Visit (HOSPITAL_BASED_OUTPATIENT_CLINIC_OR_DEPARTMENT_OTHER): Payer: Medicare Other | Admitting: Oncology

## 2013-01-30 ENCOUNTER — Telehealth: Payer: Self-pay | Admitting: Oncology

## 2013-01-30 ENCOUNTER — Other Ambulatory Visit: Payer: Self-pay | Admitting: *Deleted

## 2013-01-30 VITALS — BP 123/64 | HR 113 | Temp 98.1°F | Resp 18 | Ht 70.0 in | Wt 178.9 lb

## 2013-01-30 DIAGNOSIS — I82409 Acute embolism and thrombosis of unspecified deep veins of unspecified lower extremity: Secondary | ICD-10-CM

## 2013-01-30 DIAGNOSIS — R918 Other nonspecific abnormal finding of lung field: Secondary | ICD-10-CM

## 2013-01-30 DIAGNOSIS — Z5111 Encounter for antineoplastic chemotherapy: Secondary | ICD-10-CM

## 2013-01-30 DIAGNOSIS — C20 Malignant neoplasm of rectum: Secondary | ICD-10-CM

## 2013-01-30 DIAGNOSIS — R970 Elevated carcinoembryonic antigen [CEA]: Secondary | ICD-10-CM

## 2013-01-30 MED ORDER — NORMAL SALINE FLUSH 0.9 % IV SOLN: 10.0000 mL | INTRAVENOUS | Status: DC

## 2013-01-30 MED ORDER — HEPARIN SOD (PORK) LOCK FLUSH 100 UNIT/ML IV SOLN
500.0000 [IU] | Freq: Once | INTRAVENOUS | Status: AC | PRN
  Administered 2013-01-30: 500 [IU]
  Filled 2013-01-30: qty 5

## 2013-01-30 MED ORDER — ONDANSETRON 8 MG/50ML IVPB (CHCC)
8.0000 mg | Freq: Once | INTRAVENOUS | Status: AC
  Administered 2013-01-30: 8 mg via INTRAVENOUS

## 2013-01-30 MED ORDER — DEXTROSE 5 % IV SOLN
100.0000 mg/m2 | Freq: Once | INTRAVENOUS | Status: AC
  Administered 2013-01-30: 200 mg via INTRAVENOUS
  Filled 2013-01-30: qty 40

## 2013-01-30 MED ORDER — DEXAMETHASONE SODIUM PHOSPHATE 10 MG/ML IJ SOLN
10.0000 mg | Freq: Once | INTRAMUSCULAR | Status: AC
  Administered 2013-01-30: 10 mg via INTRAVENOUS

## 2013-01-30 MED ORDER — DEXTROSE 5 % IV SOLN
Freq: Once | INTRAVENOUS | Status: AC
  Administered 2013-01-30: 10:00:00 via INTRAVENOUS

## 2013-01-30 MED ORDER — HEPARIN LOCK FLUSH 100 UNIT/ML IV SOLN: 250.0000 [IU] | INTRAVENOUS | Status: DC

## 2013-01-30 MED ORDER — SODIUM CHLORIDE 0.9 % IJ SOLN
10.0000 mL | INTRAMUSCULAR | Status: DC | PRN
  Administered 2013-01-30: 10 mL
  Filled 2013-01-30: qty 10

## 2013-01-30 NOTE — Telephone Encounter (Signed)
Per staff message and POF I have scheduled appts.  JMW  

## 2013-01-30 NOTE — Patient Instructions (Signed)
[facility] Discharge Instructions for Patients Receiving Chemotherapy  Today you received the following chemotherapy agents Oxaliplatin  To help prevent nausea and vomiting after your treatment, we encourage you to take your nausea medication as prescribed.  If you develop nausea and vomiting that is not controlled by your nausea medication, call the clinic.   BELOW ARE SYMPTOMS THAT SHOULD BE REPORTED IMMEDIATELY:  *FEVER GREATER THAN 100.5 F  *CHILLS WITH OR WITHOUT FEVER  NAUSEA AND VOMITING THAT IS NOT CONTROLLED WITH YOUR NAUSEA MEDICATION  *UNUSUAL SHORTNESS OF BREATH  *UNUSUAL BRUISING OR BLEEDING  TENDERNESS IN MOUTH AND THROAT WITH OR WITHOUT PRESENCE OF ULCERS  *URINARY PROBLEMS  *BOWEL PROBLEMS  UNUSUAL RASH Items with * indicate a potential emergency and should be followed up as soon as possible.  Feel free to call the clinic you have any questions or concerns. The clinic phone number is [phone].    Oxaliplatin Injection What is this medicine? OXALIPLATIN (ox AL i PLA tin) is a chemotherapy drug. It targets fast dividing cells, like cancer cells, and causes these cells to die. This medicine is used to treat cancers of the colon and rectum, and many other cancers. This medicine may be used for other purposes; ask your health care provider or pharmacist if you have questions. What should I tell my health care provider before I take this medicine? They need to know if you have any of these conditions: -kidney disease -an unusual or allergic reaction to oxaliplatin, other chemotherapy, other medicines, foods, dyes, or preservatives -pregnant or trying to get pregnant -breast-feeding How should I use this medicine? This drug is given as an infusion into a vein. It is administered in a hospital or clinic by a specially trained health care professional. Talk to your pediatrician regarding the use of this medicine in children. Special care may be  needed. Overdosage: If you think you have taken too much of this medicine contact a poison control center or emergency room at once. NOTE: This medicine is only for you. Do not share this medicine with others. What if I miss a dose? It is important not to miss a dose. Call your doctor or health care professional if you are unable to keep an appointment. What may interact with this medicine? -medicines to increase blood counts like filgrastim, pegfilgrastim, sargramostim -probenecid -some antibiotics like amikacin, gentamicin, neomycin, polymyxin B, streptomycin, tobramycin -zalcitabine Talk to your doctor or health care professional before taking any of these medicines: -acetaminophen -aspirin -ibuprofen -ketoprofen -naproxen This list may not describe all possible interactions. Give your health care provider a list of all the medicines, herbs, non-prescription drugs, or dietary supplements you use. Also tell them if you smoke, drink alcohol, or use illegal drugs. Some items may interact with your medicine. What should I watch for while using this medicine? Your condition will be monitored carefully while you are receiving this medicine. You will need important blood work done while you are taking this medicine. This medicine can make you more sensitive to cold. Do not drink cold drinks or use ice. Cover exposed skin before coming in contact with cold temperatures or cold objects. When out in cold weather wear warm clothing and cover your mouth and nose to warm the air that goes into your lungs. Tell your doctor if you get sensitive to the cold. This drug may make you feel generally unwell. This is not uncommon, as chemotherapy can affect healthy cells as well as cancer cells.  Report any side effects. Continue your course of treatment even though you feel ill unless your doctor tells you to stop. In some cases, you may be given additional medicines to help with side effects. Follow all  directions for their use. Call your doctor or health care professional for advice if you get a fever, chills or sore throat, or other symptoms of a cold or flu. Do not treat yourself. This drug decreases your body's ability to fight infections. Try to avoid being around people who are sick. This medicine may increase your risk to bruise or bleed. Call your doctor or health care professional if you notice any unusual bleeding. Be careful brushing and flossing your teeth or using a toothpick because you may get an infection or bleed more easily. If you have any dental work done, tell your dentist you are receiving this medicine. Avoid taking products that contain aspirin, acetaminophen, ibuprofen, naproxen, or ketoprofen unless instructed by your doctor. These medicines may hide a fever. Do not become pregnant while taking this medicine. Women should inform their doctor if they wish to become pregnant or think they might be pregnant. There is a potential for serious side effects to an unborn child. Talk to your health care professional or pharmacist for more information. Do not breast-feed an infant while taking this medicine. Call your doctor or health care professional if you get diarrhea. Do not treat yourself. What side effects may I notice from receiving this medicine? Side effects that you should report to your doctor or health care professional as soon as possible: -allergic reactions like skin rash, itching or hives, swelling of the face, lips, or tongue -low blood counts - This drug may decrease the number of white blood cells, red blood cells and platelets. You may be at increased risk for infections and bleeding. -signs of infection - fever or chills, cough, sore throat, pain or difficulty passing urine -signs of decreased platelets or bleeding - bruising, pinpoint red spots on the skin, black, tarry stools, nosebleeds -signs of decreased red blood cells - unusually weak or tired, fainting  spells, lightheadedness -breathing problems -chest pain, pressure -cough -diarrhea -jaw tightness -mouth sores -nausea and vomiting -pain, swelling, redness or irritation at the injection site -pain, tingling, numbness in the hands or feet -problems with balance, talking, walking -redness, blistering, peeling or loosening of the skin, including inside the mouth -trouble passing urine or change in the amount of urine Side effects that usually do not require medical attention (report to your doctor or health care professional if they continue or are bothersome): -changes in vision -constipation -hair loss -loss of appetite -metallic taste in the mouth or changes in taste -stomach pain This list may not describe all possible side effects. Call your doctor for medical advice about side effects. You may report side effects to FDA at 1-800-FDA-1088. Where should I keep my medicine? This drug is given in a hospital or clinic and will not be stored at home. NOTE: This sheet is a summary. It may not cover all possible information. If you have questions about this medicine, talk to your doctor, pharmacist, or health care provider.  2013, [employer]. (12/18/2007 5:22:47 PM)

## 2013-01-30 NOTE — Progress Notes (Signed)
   [facility]    OFFICE PROGRESS NOTE   INTERVAL HISTORY:   He returns as scheduled. The right arm discomfort has improved. A Doppler of the right upper extremity on 01/21/2013 confirmed a deep vein thrombosis involving the right subclavian, right axillary, and right brachial veins. A right leg Doppler was negative.  He is maintained on daily Lovenox. He has received capecitabine and is scheduled to begin the first cycle of CAPOX today.  Objective:  Vital signs in last 24 hours:  Blood pressure 123/64, pulse 113, temperature 98.1 F (36.7 C), temperature source Oral, resp. rate 18, height 5\' 10"  (1.778 m), weight 178 lb 14.4 oz (81.149 kg), SpO2 99.00%.    Resp: Lungs clear bilaterally Cardio: Regular rate and rhythm GI: No hepatomegaly, nontender, healed surgical incisions, right lower quadrant ileostomy Vascular: No leg edema, no right arm edema or erythema  Skin: Small ecchymoses over the abdominal wall   Portacath/PICC-without erythema  Lab Results:  Lab Results  Component Value Date   WBC 8.2 01/21/2013   HGB 10.2* 01/21/2013   HCT 30.0* 01/21/2013   MCV 94.7 01/21/2013   PLT 322 01/21/2013   ANC 5.9 CEA 1.1 Creatinine 0.9, alk phosphatase 144, AST 40, ALT 107, bilirubin 0.3  Medications: I have reviewed the patient's current medications.  Assessment/Plan: 1.stage III rectal cancer, presentation with a locally advanced rectal tumor-CT/MRI evidence of perirectal lymphadenopathy, status post neoadjuvant Xeloda and radiation  -Status post a low anterior resection and coloanal anastomosis 07/24/2014with the pathology confirming a ypT3,ypN1b tumor, microsatellite stable  2. Small bilateral lung nodules and a mildly enlarged gastrohepatic node on the initial staging CT scans 08/02/2012  -CT chest on 01/23/2013 with stable bilateral pulmonary nodules 3. Elevated pretreatment CEA  4. mitral regurgitation  5. hypertension  6. History of rheumatic fever    Disposition:  7. Pain and swelling at the right arm on 01/18/2013 with a Doppler confirming a right upper treatment a deep vein thrombosis, daily Lovenox started on 01/18/2013    Disposition:  The right upper extremity appears improved after starting Lovenox. He will continue daily Lovenox. We will arrange for PICC care since he is no longer receiving home IV fluids.  Mr. Hemstreet will begin a first cycle of adjuvant CAPOX today. He will return for an office visit in 3 weeks.   Thornton Papas, MD  01/30/2013  10:23 AM

## 2013-01-30 NOTE — Progress Notes (Signed)
Called [facility] : They will not be able to provide PICC flush supplies after his IVF orders expire today. Will need to be ordered through his local pharmacy. Weekly dressing changes to be done by [facility].  Ordered saline/heparin flushes to determine out of pocket cost for patient. Will call patient with this information and determine schedule for dressing changes.

## 2013-01-30 NOTE — Progress Notes (Signed)
Takes 8-9 Tylenol # 3/day for pain. Apprehensive about taking oxycodone due to past dose caused "bad dreams". Ostomy output controlled with 3-4 Imodium/day. Eating well. Says his swelling in right arm has decreased. Son is administering the Lovenox daily. Reports his daily home infusion of IV fluids will complete on Friday.

## 2013-01-30 NOTE — Telephone Encounter (Signed)
Pt aware of appt on september 2014, lab Md and chemo

## 2013-01-31 ENCOUNTER — Telehealth: Payer: Self-pay | Admitting: *Deleted

## 2013-01-31 DIAGNOSIS — C20 Malignant neoplasm of rectum: Secondary | ICD-10-CM

## 2013-01-31 MED ORDER — HEPARIN LOCK FLUSH 100 UNIT/ML IV SOLN: 250.0000 [IU] | INTRAVENOUS | Status: DC

## 2013-01-31 MED ORDER — NORMAL SALINE FLUSH 0.9 % IV SOLN: 10.0000 mL | INTRAVENOUS | Status: DC

## 2013-01-31 NOTE — Telephone Encounter (Signed)
Called patient to follow up on 1st time Oxaliplatin. Pt denies any N/V or diarrhea. States he tolerated it well. Denies fever. Will call if has any problems.

## 2013-01-31 NOTE — Telephone Encounter (Signed)
[facility] not able to fill the saline/heparin scripts. Left message with family to see if he wants this forwarded to [facility] or [facility] and they have not responded. Called son and he suggests trying [facility] since this is where he gets his ostomy supplies. E-scribed the flushes to this pharmacy. Will check on cost tomorrow. Last PICC line dressing change was on 01/28/13 according to the son by [facility] nurse.

## 2013-02-01 ENCOUNTER — Telehealth: Payer: Self-pay | Admitting: *Deleted

## 2013-02-01 NOTE — Telephone Encounter (Addendum)
Home health nurse called to report patient is scheduled for one last visit on 02/04/13 and she will change his PICC dressing and cap and flush. She will check with her supervisor to see if they can continue to service him weekly for the dressing change and will call back. If so, she will write order for weekly care. Reviewed to flush with 10 cc NS and 2.5 cc heparin (100u/ml) on MWF only. Return call from [facility]. Will only be allowed to see him last time on Monday for dressing change. After this, his charge would be $150 each visit and he would have to purchase his own dressing supplies also. Any future home health needs or questions call her supervisor, Laney Potash at [phone] ext [phone]. Wife notified.

## 2013-02-01 NOTE — Telephone Encounter (Addendum)
PICC supplies at [facility] priced at $77.70 for #12 syringes of heparin & saline (1 month supply),after insurance.  Sent request to [facility] outpatient pharmacy requesting price comparison at patient request. According to wife, he still has #9 syringes on hand of saline/heparin. Made her aware he only needs to flush on MWF now since he is not getting IV fluids. Documentation shows he does not have a Power PICC. Made patient's wife aware he needs to have PICC dressing changed with cap change weekly by RN (son is not qualified to do this). They are asking to have dressing changed at [facility] clinic on week he is not at [facility] to save him driving. Left voice mail with nursing line [facility] Torrie) making this request. 1st dressing change due 02/04/13, but could have him come in today or on 02/05/13. Requested call back. Per [facility] pharmacy: 1 month supplies cost total of $48.55 there. Wife prefers to use this pharmacy for his PICC supplies. Will e-scribe couple days prior to his 9/17 office visit to pick up that day. Faxed request to [facility] requesting dressing/cap change there on 9/8 & 9/15.

## 2013-02-01 NOTE — Telephone Encounter (Signed)
Tyler Diaz with [facility] clinic called to report they are willing to do his weekly dressing changes when not in [place] clinic. They will call him for time on 9/8 and 9/15.

## 2013-02-03 ENCOUNTER — Telehealth (INDEPENDENT_AMBULATORY_CARE_PROVIDER_SITE_OTHER): Payer: Self-pay | Admitting: General Surgery

## 2013-02-03 NOTE — Telephone Encounter (Signed)
He called saying that he was "feeling bad".  He vomited once yesterday but none today.  He has been taking 60 oz of water today and 2 ensures and he says that he has been drinking about as much as is coming from his ostomy.  He says that he is urinating well and denies fevers or chills or distension.  He is complaining of back pain but not abdominal pain.  I explained that I could not be sure what was causing his symptoms without evaluating him and that I could only do that if he came to the ER.  I also suggested that he contact the on call oncologist to see if this could be side effect from his chemo. He said that he was not feeling bad enough to come to the ER but agreed to do that if this continued or worsened.

## 2013-02-05 ENCOUNTER — Telehealth: Payer: Self-pay | Admitting: Oncology

## 2013-02-05 ENCOUNTER — Telehealth: Payer: Self-pay | Admitting: *Deleted

## 2013-02-05 ENCOUNTER — Encounter: Payer: Self-pay | Admitting: General Practice

## 2013-02-05 ENCOUNTER — Ambulatory Visit (INDEPENDENT_AMBULATORY_CARE_PROVIDER_SITE_OTHER): Payer: Medicare Other | Admitting: General Practice

## 2013-02-05 ENCOUNTER — Telehealth: Payer: Self-pay | Admitting: Family Medicine

## 2013-02-05 VITALS — BP 115/70 | HR 115 | Temp 97.4°F | Ht 70.0 in | Wt 176.0 lb

## 2013-02-05 DIAGNOSIS — B029 Zoster without complications: Secondary | ICD-10-CM

## 2013-02-05 MED ORDER — VALACYCLOVIR HCL 1 G PO TABS: 1000.0000 mg | ORAL_TABLET | Freq: Three times a day (TID) | ORAL | Status: DC

## 2013-02-05 NOTE — Telephone Encounter (Signed)
Rash on side. Appt scheduled for this afternoon. Patient aware.

## 2013-02-05 NOTE — Progress Notes (Signed)
  Subjective:    Patient ID: Tyler Diaz, male    DOB: 1945-07-21, 67 y.o.   MRN: [id number]  Rash This is a new problem. The current episode started in the past 7 days. The problem has been gradually worsening since onset. The affected locations include the back and abdomen (right back and abdomen area). The rash is characterized by redness and swelling. He was exposed to nothing. Pertinent negatives include no congestion, cough, fever or shortness of breath. Past treatments include nothing (Lotion).  Patient denies pain from rash, but reports taking narcotic for pain     Review of Systems  Constitutional: Negative for fever and chills.  HENT: Negative for congestion.   Respiratory: Negative for cough, chest tightness and shortness of breath.   Cardiovascular: Negative for chest pain.  Gastrointestinal: Negative for abdominal pain.       Colostomy noted to right abdomen  Skin: Positive for rash.       Right back to right abdomen raised rash  Neurological: Negative for dizziness, weakness and headaches.       Objective:   Physical Exam  Constitutional: He is oriented to person, place, and time. He appears well-developed.  Cardiovascular: Normal rate, regular rhythm and normal heart sounds.   Pulmonary/Chest: Effort normal and breath sounds normal.  Neurological: He is alert and oriented to person, place, and time.  Skin: Skin is warm and dry. Rash noted. There is erythema.  Erythematous, clustered maculopapular, vesicle rash noted to right mid back to right abdomen  Psychiatric: He has a normal mood and affect.          Assessment & Plan:  1. Shingles rash - valACYclovir (VALTREX) 1000 MG tablet; Take 1 tablet (1,000 mg total) by mouth every 8 (eight) hours.  Dispense: 21 tablet; Refill: 0 -information sheet on shingles provided and discussed -discussed prevention of spreading shingles -discussed low blood pressure and importance of checking blood pressure at home,  maintaining diary -discussed seeking emergency medical treatment if blood pressure low or symptomatic -RTO if shingles rash worsens or unresolved -Patient and wife verbalized understanding -Coralie Keens, FNP-C

## 2013-02-05 NOTE — Telephone Encounter (Signed)
Faxed pt medical records to Dr. Dondra Prader

## 2013-02-05 NOTE — Patient Instructions (Addendum)
Shingles Shingles (herpes zoster) is an infection that is caused by the same virus that causes chickenpox (varicella). The infection causes a painful skin rash and fluid-filled blisters, which eventually break open, crust over, and heal. It may occur in any area of the body, but it usually affects only one side of the body or face. The pain of shingles usually lasts about 1 month. However, some people with shingles may develop long-term (chronic) pain in the affected area of the body. Shingles often occurs many years after the person had chickenpox. It is more common:  In people older than 50 years.  In people with weakened immune systems, such as those with HIV, AIDS, or cancer.  In people taking medicines that weaken the immune system, such as transplant medicines.  In people under great stress. CAUSES  Shingles is caused by the varicella zoster virus (VZV), which also causes chickenpox. After a person is infected with the virus, it can remain in the person's body for years in an inactive state (dormant). To cause shingles, the virus reactivates and breaks out as an infection in a nerve root. The virus can be spread from person to person (contagious) through contact with open blisters of the shingles rash. It will only spread to people who have not had chickenpox. When these people are exposed to the virus, they may develop chickenpox. They will not develop shingles. Once the blisters scab over, the person is no longer contagious and cannot spread the virus to others. SYMPTOMS  Shingles shows up in stages. The initial symptoms may be pain, itching, and tingling in an area of the skin. This pain is usually described as burning, stabbing, or throbbing.In a few days or weeks, a painful red rash will appear in the area where the pain, itching, and tingling were felt. The rash is usually on one side of the body in a band or belt-like pattern. Then, the rash usually turns into fluid-filled blisters. They  will scab over and dry up in approximately 2 3 weeks. Flu-like symptoms may also occur with the initial symptoms, the rash, or the blisters. These may include:  Fever.  Chills.  Headache.  Upset stomach. DIAGNOSIS  Your caregiver will perform a skin exam to diagnose shingles. Skin scrapings or fluid samples may also be taken from the blisters. This sample will be examined under a microscope or sent to a lab for further testing. TREATMENT  There is no specific cure for shingles. Your caregiver will likely prescribe medicines to help you manage the pain, recover faster, and avoid long-term problems. This may include antiviral drugs, anti-inflammatory drugs, and pain medicines. HOME CARE INSTRUCTIONS   Take a cool bath or apply cool compresses to the area of the rash or blisters as directed. This may help with the pain and itching.   Only take over-the-counter or prescription medicines as directed by your caregiver.   Rest as directed by your caregiver.  Keep your rash and blisters clean with mild soap and cool water or as directed by your caregiver.  Do not pick your blisters or scratch your rash. Apply an anti-itch cream or numbing creams to the affected area as directed by your caregiver.  Keep your shingles rash covered with a loose bandage (dressing).  Avoid skin contact with:  Babies.   Pregnant women.   Children with eczema.   Elderly people with transplants.   People with chronic illnesses, such as leukemia or AIDS.   Wear loose-fitting clothing to help ease   the pain of material rubbing against the rash.  Keep all follow-up appointments with your caregiver.If the area involved is on your face, you may receive a referral for follow-up to a specialist, such as an eye doctor (ophthalmologist) or an ear, nose, and throat (ENT) doctor. Keeping all follow-up appointments will help you avoid eye complications, chronic pain, or disability.  SEEK IMMEDIATE MEDICAL  CARE IF:   You have facial pain, pain around the eye area, or loss of feeling on one side of your face.  You have ear pain or ringing in your ear.  You have loss of taste.  Your pain is not relieved with prescribed medicines.   Your redness or swelling spreads.   You have more pain and swelling.  Your condition is worsening or has changed.   You have a feveror persistent symptoms for more than 2 3 days.  You have a fever and your symptoms suddenly get worse. MAKE SURE YOU:  Understand these instructions.  Will watch your condition.  Will get help right away if you are not doing well or get worse. Document Released: 05/23/2005 Document Revised: 02/15/2012 Document Reviewed: 01/05/2012 [facility] Patient Information 2014 [place], [place].  Fall Prevention and Home Safety Falls cause injuries and can affect all age groups. It is possible to use preventive measures to significantly decrease the likelihood of falls. There are many simple measures which can make your home safer and prevent falls. OUTDOORS  Repair cracks and edges of walkways and driveways.  Remove high doorway thresholds.  Trim shrubbery on the main path into your home.  Have good outside lighting.  Clear walkways of tools, rocks, debris, and clutter.  Check that handrails are not broken and are securely fastened. Both sides of steps should have handrails.  Have leaves, snow, and ice cleared regularly.  Use sand or salt on walkways during winter months.  In the garage, clean up grease or oil spills. BATHROOM  Install night lights.  Install grab bars by the toilet and in the tub and shower.  Use non-skid mats or decals in the tub or shower.  Place a plastic non-slip stool in the shower to sit on, if needed.  Keep floors dry and clean up all water on the floor immediately.  Remove soap buildup in the tub or shower on a regular basis.  Secure bath mats with non-slip, double-sided rug  tape.  Remove throw rugs and tripping hazards from the floors. BEDROOMS  Install night lights.  Make sure a bedside light is easy to reach.  Do not use oversized bedding.  Keep a telephone by your bedside.  Have a firm chair with side arms to use for getting dressed.  Remove throw rugs and tripping hazards from the floor. KITCHEN  Keep handles on pots and pans turned toward the center of the stove. Use back burners when possible.  Clean up spills quickly and allow time for drying.  Avoid walking on wet floors.  Avoid hot utensils and knives.  Position shelves so they are not too high or low.  Place commonly used objects within easy reach.  If necessary, use a sturdy step stool with a grab bar when reaching.  Keep electrical cables out of the way.  Do not use floor polish or wax that makes floors slippery. If you must use wax, use non-skid floor wax.  Remove throw rugs and tripping hazards from the floor. STAIRWAYS  Never leave objects on stairs.  Place handrails on both sides of  stairways and use them. Fix any loose handrails. Make sure handrails on both sides of the stairways are as long as the stairs.  Check carpeting to make sure it is firmly attached along stairs. Make repairs to worn or loose carpet promptly.  Avoid placing throw rugs at the top or bottom of stairways, or properly secure the rug with carpet tape to prevent slippage. Get rid of throw rugs, if possible.  Have an electrician put in a light switch at the top and bottom of the stairs. OTHER FALL PREVENTION TIPS  Wear low-heel or rubber-soled shoes that are supportive and fit well. Wear closed toe shoes.  When using a stepladder, make sure it is fully opened and both spreaders are firmly locked. Do not climb a closed stepladder.  Add color or contrast paint or tape to grab bars and handrails in your home. Place contrasting color strips on first and last steps.  Learn and use mobility aids as  needed. Install an electrical emergency response system.  Turn on lights to avoid dark areas. Replace light bulbs that burn out immediately. Get light switches that glow.  Arrange furniture to create clear pathways. Keep furniture in the same place.  Firmly attach carpet with non-skid or double-sided tape.  Eliminate uneven floor surfaces.  Select a carpet pattern that does not visually hide the edge of steps.  Be aware of all pets. OTHER HOME SAFETY TIPS  Set the water temperature for 120 F (48.8 C).  Keep emergency numbers on or near the telephone.  Keep smoke detectors on every level of the home and near sleeping areas. Document Released: 05/13/2002 Document Revised: 11/22/2011 Document Reviewed: 08/12/2011 [facility] Patient Information 2014 [place], [place].

## 2013-02-05 NOTE — Telephone Encounter (Signed)
Returned call to pt, he reports he is taking 6-8 Tylenol 500 mg daily for his abdominal pain. Recommended he try Tramadol to see if this is more effective. He agrees to try this. He reports after speaking with home health RN his stools are slightly more firm. Understands to take Imodium PRN. "Whelps" on R side remain unchanged. Denies itching or nerve pain in this area. Does not seem to correlate with injection sites. Had one episode of emesis on 8/30. Nausea otherwise relieved with Phenergan.  Reviewed above with Dr. Truett Perna: Recommend he see PCP to evaluate rash. Returned call to pt, he states he will call Dr. Kathi Der office to be worked in for rash.

## 2013-02-05 NOTE — Telephone Encounter (Addendum)
PT. HAS "WHELPS AROUND HIS SIDE" AND HIS ANKLES HAVE "BROKEN OUT". PT.'S STOMACH IS HURTING ALL THE TIME AT A SCALE OF EIGHT. HE TOOK TYLENOL #3 WHICH BRINGS THE PAIN TO A FOUR. PT. IS TAKING FLUIDS [64 OUNCES IN THE PAST 24 HOURS] BUT IS NOT EATING. HE VOMITED ONCE ON Saturday. PT. HAS HAD DIARRHEA IN HIS COLOSTOMY. HE TOOK IMODIUM WHICH HAS NOT HELPED UNTIL THIS MORNING THE STOOL IS FIRM. NO FEVER. THIS NOTE TO DR.SHERRILL'S NURSE, TANYA WHITLOCK,RN.

## 2013-02-06 ENCOUNTER — Telehealth: Payer: Self-pay | Admitting: *Deleted

## 2013-02-06 MED ORDER — ALPRAZOLAM 0.25 MG PO TABS: 0.2500 mg | ORAL_TABLET | Freq: Three times a day (TID) | ORAL | Status: DC | PRN

## 2013-02-06 NOTE — Telephone Encounter (Signed)
Call from pt's wife. She reports pt has been started on Valtrex for shingles. States pt is very anxious and unable to eat because of this. Pt is requesting "something for his nerves". Wife reports the colostomy, PICC line and Lovenox injections seem to be overwhelming for pt, he is asking for something to calm him down. Will review with MD.

## 2013-02-06 NOTE — Telephone Encounter (Signed)
Returned call to Mrs. Keeler, informed her Rx for Xanax has been called to pharmacy. Teaching reviewed to include: Do not drive while taking this medicine. She voiced understanding. Asks if pt should resume IVF through [facility]? Recommended he push PO fluids, call office if appetite does not improve over next day or two. She voiced understanding.

## 2013-02-08 ENCOUNTER — Telehealth: Payer: Self-pay | Admitting: *Deleted

## 2013-02-08 ENCOUNTER — Other Ambulatory Visit: Payer: Self-pay | Admitting: *Deleted

## 2013-02-08 MED ORDER — ONDANSETRON HCL 8 MG PO TABS: 8.0000 mg | ORAL_TABLET | Freq: Three times a day (TID) | ORAL | Status: DC | PRN

## 2013-02-08 NOTE — Telephone Encounter (Signed)
Returned call to pt's wife, she reports pt took Phenergan 45 min prior to eating half a sandwich and vomited. Seems to be getting weaker. Denies diarrhea. Xanax called in on 9/3 has helped his anxiety, pt's main complaint is that he feels weak. Pt and wife request to resume IV Fluids at home. Reviewed with Dr. Truett Perna, orders received. Called [facility] [place] with orders to resume IV Fluids. Normal saline 1 liter over 4 hours daily. Zofran Rx sent to pharmacy.

## 2013-02-08 NOTE — Telephone Encounter (Signed)
Spoke with Barrie Dunker, PharmD at [facility]. Dx code for Zofran is chemo induce nausea and vomiting. She stated insurance approved Zofran without prior auth.

## 2013-02-08 NOTE — Addendum Note (Signed)
Addended by: Caleb Popp on: 02/08/2013 02:14 PM   Modules accepted: Orders

## 2013-02-08 NOTE — Telephone Encounter (Signed)
PT. HAS HAD 48 OUNCES OF FLUIDS IN THE PAST 24 HOURS. HE IS EATING VERY LITTLE. PT.'S WIFE MENTIONED IV FLUIDS SINCE HER HUSBAND IS SO WEAK. THIS NOTE TO DR.SHERRILL'S NURSE, TANYA WHITLOCK,RN.

## 2013-02-08 NOTE — Telephone Encounter (Signed)
Prior authorization request received from [facility] in [place], [place]. [phone]) f or zofran.  Pharmacist reports this will not go through under [facility] part [facility].  Given diagnosis code for [facility].  Rectal cancer code 154.1 given but came up as an "invalid code".    Request to Managed Care.

## 2013-02-08 NOTE — Telephone Encounter (Signed)
Chemo-induced nausea and vomiting code given to pharmacist and this order went through per collaborative nurse.

## 2013-02-08 NOTE — Telephone Encounter (Signed)
[facility] Help Desk number is [phone].

## 2013-02-11 ENCOUNTER — Other Ambulatory Visit: Payer: Self-pay | Admitting: *Deleted

## 2013-02-11 DIAGNOSIS — C20 Malignant neoplasm of rectum: Secondary | ICD-10-CM

## 2013-02-11 NOTE — Telephone Encounter (Signed)
THIS REFILL REQUEST FOR CAPECITABINE WAS GIVEN TO DR.SHERRILL'S NURSE, AMY HORTON,RN. 

## 2013-02-12 ENCOUNTER — Telehealth (INDEPENDENT_AMBULATORY_CARE_PROVIDER_SITE_OTHER): Payer: Self-pay

## 2013-02-12 ENCOUNTER — Other Ambulatory Visit: Payer: Self-pay | Admitting: General Practice

## 2013-02-12 DIAGNOSIS — C20 Malignant neoplasm of rectum: Secondary | ICD-10-CM

## 2013-02-12 MED ORDER — CAPECITABINE 500 MG PO TABS: ORAL_TABLET | ORAL | Status: DC

## 2013-02-12 NOTE — Addendum Note (Signed)
Addended by: Arvilla Meres on: 02/12/2013 11:21 AM   Modules accepted: Orders

## 2013-02-12 NOTE — Telephone Encounter (Signed)
Called pt to check on him per the request of Dr Michaell Cowing. The pt stated that he has not been feeling well at all b/c he was diagnosed with shingles a few weeks a go. The pt has had lots of blisters and pain with the shingles. The pt is on medicine for the shingles and just now today starting to feel a little better. The pt has had no appetite but today he feels he a little hungrier. The pt has been drinking plenty of liquids along with getting 7 days of IV fluids per Dr Truett Perna. The pt was trying to get his Tylenol 3 filled thru Dr Vickii Penna office but he is no longer there and was wondering if your would refill the Tylenol 3. Please advise.

## 2013-02-13 MED ORDER — ACETAMINOPHEN-CODEINE #3 300-30 MG PO TABS: 1.0000 | ORAL_TABLET | Freq: Four times a day (QID) | ORAL | Status: DC | PRN

## 2013-02-13 NOTE — Telephone Encounter (Signed)
Called pt to notify him that we did ok the refill of the Tylenol #3 per Dr Michaell Cowing. I called the script into [facility] and left it on the voicemail for Tylenol 3 #40 x 1Rf per Dr Michaell Cowing. The pt understands.

## 2013-02-13 NOTE — Telephone Encounter (Signed)
It is okay to fill his Tylenol No. 3 up to 90 days postoperatively.  Then, I would feel better if his primary care physician manage that if this is a chronic issue.Tyler Diaz  1-2 by mouth every 6 hours when necessary pain.  #40 with one refill.

## 2013-02-19 ENCOUNTER — Other Ambulatory Visit: Payer: Self-pay | Admitting: Oncology

## 2013-02-20 ENCOUNTER — Ambulatory Visit (HOSPITAL_BASED_OUTPATIENT_CLINIC_OR_DEPARTMENT_OTHER): Payer: Medicare Other | Admitting: Nurse Practitioner

## 2013-02-20 ENCOUNTER — Other Ambulatory Visit: Payer: Self-pay | Admitting: *Deleted

## 2013-02-20 ENCOUNTER — Ambulatory Visit (HOSPITAL_BASED_OUTPATIENT_CLINIC_OR_DEPARTMENT_OTHER): Payer: Medicare Other

## 2013-02-20 ENCOUNTER — Other Ambulatory Visit (HOSPITAL_BASED_OUTPATIENT_CLINIC_OR_DEPARTMENT_OTHER): Payer: Medicare Other | Admitting: Lab

## 2013-02-20 ENCOUNTER — Encounter: Payer: Self-pay | Admitting: *Deleted

## 2013-02-20 ENCOUNTER — Ambulatory Visit: Payer: Medicare Other | Admitting: Nutrition

## 2013-02-20 VITALS — BP 108/67 | HR 121 | Temp 98.5°F | Resp 20 | Ht 70.0 in | Wt 174.7 lb

## 2013-02-20 DIAGNOSIS — C20 Malignant neoplasm of rectum: Secondary | ICD-10-CM

## 2013-02-20 DIAGNOSIS — Z5111 Encounter for antineoplastic chemotherapy: Secondary | ICD-10-CM

## 2013-02-20 LAB — CBC WITH DIFFERENTIAL/PLATELET
BASO%: 0.7 % (ref 0.0–2.0)
LYMPH%: 19 % (ref 14.0–49.0)
MONO#: 1.4 10*3/uL — ABNORMAL HIGH (ref 0.1–0.9)
MONO%: 19.1 % — ABNORMAL HIGH (ref 0.0–14.0)
NEUT#: 4 10*3/uL (ref 1.5–6.5)
RBC: 3.44 10*6/uL — ABNORMAL LOW (ref 4.20–5.82)
RDW: 18.9 % — ABNORMAL HIGH (ref 11.0–14.6)
WBC: 7.3 10*3/uL (ref 4.0–10.3)
lymph#: 1.4 10*3/uL (ref 0.9–3.3)
nRBC: 0 % (ref 0–0)

## 2013-02-20 LAB — COMPREHENSIVE METABOLIC PANEL (CC13)
ALT: 39 U/L (ref 0–55)
AST: 30 U/L (ref 5–34)
Alkaline Phosphatase: 81 U/L (ref 40–150)
BUN: 14.5 mg/dL (ref 7.0–26.0)
Calcium: 9.7 mg/dL (ref 8.4–10.4)
Chloride: 99 mEq/L (ref 98–109)
Creatinine: 0.9 mg/dL (ref 0.7–1.3)

## 2013-02-20 MED ORDER — SODIUM CHLORIDE 0.9 % IJ SOLN
10.0000 mL | INTRAMUSCULAR | Status: DC | PRN
  Administered 2013-02-20: 10 mL
  Filled 2013-02-20: qty 10

## 2013-02-20 MED ORDER — HEPARIN SOD (PORK) LOCK FLUSH 100 UNIT/ML IV SOLN
250.0000 [IU] | Freq: Once | INTRAVENOUS | Status: AC
  Administered 2013-02-20: 250 [IU] via INTRAVENOUS
  Filled 2013-02-20: qty 5

## 2013-02-20 MED ORDER — DEXTROSE 5 % IV SOLN
Freq: Once | INTRAVENOUS | Status: AC
  Administered 2013-02-20: 13:00:00 via INTRAVENOUS

## 2013-02-20 MED ORDER — DEXAMETHASONE SODIUM PHOSPHATE 10 MG/ML IJ SOLN
INTRAMUSCULAR | Status: AC
  Filled 2013-02-20: qty 1

## 2013-02-20 MED ORDER — PALONOSETRON HCL INJECTION 0.25 MG/5ML
INTRAVENOUS | Status: AC
  Filled 2013-02-20: qty 5

## 2013-02-20 MED ORDER — OXALIPLATIN CHEMO INJECTION 100 MG/20ML
100.0000 mg/m2 | Freq: Once | INTRAVENOUS | Status: AC
  Administered 2013-02-20: 200 mg via INTRAVENOUS
  Filled 2013-02-20: qty 40

## 2013-02-20 MED ORDER — HYDROCODONE-ACETAMINOPHEN 5-325 MG PO TABS: 1.0000 | ORAL_TABLET | ORAL | Status: DC | PRN

## 2013-02-20 MED ORDER — PALONOSETRON HCL INJECTION 0.25 MG/5ML
0.2500 mg | Freq: Once | INTRAVENOUS | Status: AC
  Administered 2013-02-20: 0.25 mg via INTRAVENOUS

## 2013-02-20 MED ORDER — DEXAMETHASONE SODIUM PHOSPHATE 10 MG/ML IJ SOLN
10.0000 mg | Freq: Once | INTRAMUSCULAR | Status: AC
  Administered 2013-02-20: 10 mg via INTRAVENOUS

## 2013-02-20 MED ORDER — HEPARIN SOD (PORK) LOCK FLUSH 100 UNIT/ML IV SOLN
500.0000 [IU] | Freq: Once | INTRAVENOUS | Status: AC | PRN
  Filled 2013-02-20: qty 5

## 2013-02-20 NOTE — Progress Notes (Signed)
OFFICE PROGRESS NOTE  Interval history:  Tyler Diaz is a 67 year old man with stage III rectal cancer on active treatment with adjuvant CAPOX. He completed cycle 1 beginning 01/30/2013. He is seen today prior to proceeding with cycle 2.  He had some nausea with 2 episodes of vomiting. No mouth sores. No hand or foot pain or redness. He has noted no increase in baseline watery output from the ostomy. The diarrhea is controlled with Imodium. He avoided cold exposure following the oxaliplatin. He occasionally notes tingling in the right hand. Appetite is marginal. He feels he is taking in enough fluids by mouth. He is taking IV fluids every day via the PICC. Energy level is poor. He continues daily Lovenox injections.  He was diagnosed with shingles at the right abdomen/back since his last visit. He completed a one-week course of valacyclovir. He is having pain associated with the shingles. Tylenol #3 has been partially effective. He would like to try a different pain medication.   Objective: Blood pressure 108/67, pulse 121, temperature 98.5 F (36.9 C), temperature source Oral, resp. rate 20, height 5\' 10"  (1.778 m), weight 174 lb 11.2 oz (79.243 kg).  Oropharynx is without ulceration. Mild white coating over tongue. Mucous membranes appear moist. Lungs are clear. Regular cardiac rhythm. Question faint systolic murmur. Abdomen is soft and nontender. No hepatomegaly. Ileostomy; collection bag is empty. Extremities without edema. Right upper extremity PICC site is without erythema. Palms are without erythema. Healing zoster rash at the right abdomen/back.  Lab Results: Lab Results  Component Value Date   WBC 7.3 02/20/2013   HGB 11.1* 02/20/2013   HCT 33.4* 02/20/2013   MCV 97.1 02/20/2013   PLT 200 02/20/2013    Chemistry:    Chemistry      Component Value Date/Time   NA 139 01/21/2013 0857   NA 135 01/14/2013 1155   K 4.5 01/21/2013 0857   K 4.8 01/14/2013 1155   CL 99 01/14/2013 1155   CO2 27  01/21/2013 0857   CO2 26 01/14/2013 1155   BUN 14.0 01/21/2013 0857   BUN 21 01/14/2013 1155   CREATININE 0.9 01/21/2013 0857   CREATININE 1.13 01/14/2013 1155   CREATININE 1.13 01/03/2013 0343      Component Value Date/Time   CALCIUM 9.6 01/21/2013 0857   CALCIUM 9.4 01/14/2013 1155   ALKPHOS 144 01/21/2013 0857   ALKPHOS 44 12/31/2012 0820   AST 40* 01/21/2013 0857   AST 15 12/31/2012 0820   ALT 107* 01/21/2013 0857   ALT 11 12/31/2012 0820   BILITOT 0.30 01/21/2013 0857   BILITOT 0.5 12/31/2012 0820       Studies/Results: Ct Chest W Contrast  01/23/2013   CLINICAL DATA:  Rectal cancer.  EXAM: CT CHEST WITH CONTRAST  TECHNIQUE: Multidetector CT imaging of the chest was performed during intravenous contrast administration.  CONTRAST:  80mL OMNIPAQUE IOHEXOL 300 MG/ML  SOLN  COMPARISON:  08/02/2012  FINDINGS: Multiple small pulmonary nodules as follows:  Right upper lobe 2 mm nodule on image 21, stable.  3 mm posterior right upper lobe nodule on image 23, stable.  3 mm nodule in the peripheral left upper lobe on image 31, stable.  8 mm right lower lobe nodule on image 36. I suspect this apparent slight change in size is related to scan planes. I feel this is unchanged.  3 mm posterior medial right lower lobe nodule on image 42, stable.  Scarring in the left lower lobe at the left lung  base on image 50 is stable. Probable scarring in the right middle lobe anteriorly on image 38, stable. No new or enlarging pulmonary nodules. No pleural effusions.  Heart is normal size. Aorta is normal caliber. No coronary artery calcifications. No mediastinal, hilar, or axillary adenopathy. Right PICC line is in place with the tip in the SVC. Visualized thyroid and chest wall soft tissues unremarkable.  Imaging into the upper abdomen shows no acute findings. Multiple gallstones fill the gallbladder.  No acute or focal bony abnormality.  IMPRESSION: Multiple small scattered bilateral pulmonary nodules. The short-term stability  is encouraging, but additional follow-up is recommended. Recommend attention on followup imaging in 12-18 months.  Cholelithiasis.   Electronically Signed   By: Charlett Nose   On: 01/23/2013 12:15    Medications: I have reviewed the patient's current medications.  Assessment/Plan:  1. Stage III rectal cancer, presenting with a locally advanced rectal tumor.   CT/MRI evidence of perirectal lymphadenopathy.   Status post neoadjuvant Xeloda and radiation.   Status post low anterior resection and coloanal anastomosis 12/27/2012 with pathology confirming a ypT3, ypN1b tumor, microsatellite stable.   Initiation of adjuvant CAPOX chemotherapy 01/30/2013. 2. Small bilateral lung nodules and a mildly enlarged gastrohepatic node on the initial staging CT scans 08/02/2012.   CT chest on 01/23/2013 with stable bilateral pulmonary nodules. 3. Elevated pretreatment CEA.   Repeat CEA 1.1 on 01/21/2013. 4. Mitral regurgitation. 5. Hypertension. 6. History of rheumatic fever. 7. Pain and swelling at the right arm on 01/18/2013.  Doppler confirmed a right upper extremity deep vein thrombosis.   Daily Lovenox started on 01/18/2013. 8. Acute herpes zoster affecting right thoracic dermatomes September 2014. He completed a course of valacyclovir. 9. Pain related to herpes zoster. 10. Delayed nausea following cycle 1 CAPOX.  Disposition-he appears stable. Following cycle 1 CAPOX he had delayed nausea. Plan to proceed with cycle 2 adjuvant CAPOX today as scheduled with Aloxi to be added to the premedication regimen.  He has pain related to an acute herpes zoster infection. He was given a prescription for Vicodin 5/325 one tablet every 4 hours as needed for pain. He will contact the office if this is not effective.  He will return for a followup visit and cycle 3 CAPOX in 3 weeks. He will contact the office in the interim as outlined above or with any other problems.   Plan reviewed with Dr.  Truett Perna.   Lonna Cobb ANP/GNP-BC

## 2013-02-20 NOTE — Progress Notes (Signed)
Patient is a 67 year old male diagnosed with rectal cancer.  He is a patient of Dr. Truett Perna.  Past medical history includes hyperlipidemia, hypothyroidism, and arthritis.  Medications include Xeloda, ferrous sulfate, Imodium, multivitamin, and Phenergan.  Labs include albumin 2.8.  Height: 5 feet 10 inches. Weight: 174.7 pounds. Usual body weight 192 pounds per patient. BMI: 25.07.    Patient is one month status post lap, low anterior resection.  He does have an ileostomy.  He denies problems with his ileostomy.  He reports he drinks a lot of Gatorade and a lot of fluids.  He is consuming small amounts of food throughout the day.  He is avoiding foods such as corn and cabbage, that potentially could cause a blockage.  He has good baseline knowledge of nutrition education.  Nutrition diagnosis: Unintended weight loss related to diagnosis of rectal cancer and associated treatments as evidenced by 9% weight loss from usual body weight.  Intervention: I reinforced the importance of adequate calories and protein in small amounts throughout the day.  I've encouraged patient to chew his food well and avoid foods that could potentially cause blockage of his ileostomy.  We have discussed problematic foods that may cause more gas.  I have educated him on high fiber foods that should be well tolerated. I have provided fact sheets for him.  Teach back method used.  Contact information given.  Questions were answered.  Monitoring, evaluation, goals: Patient will tolerate adequate calories and protein to minimize further weight loss throughout treatment.  He will tolerate oral diet to avoid blockage.  Next visit: Wednesday, October 8, during chemotherapy.

## 2013-02-21 ENCOUNTER — Encounter: Payer: Self-pay | Admitting: *Deleted

## 2013-02-21 NOTE — Progress Notes (Signed)
RECEIVED A FAX FROM [employer] CONCERNING A CONFIRMATION OF PRESCRIPTION SHIPMENT FOR CAPECITABINE ON 02/20/13. 

## 2013-02-22 ENCOUNTER — Telehealth: Payer: Self-pay | Admitting: *Deleted

## 2013-02-22 NOTE — Telephone Encounter (Signed)
sw pt wife gv appts for 03/13/13 w/ labs @ 11:45am, ov @ 12:15pm, tx to follow and Beff will come in the tx area. gve appts for 04/03/13. i emailed MW to added the tx's....td

## 2013-02-22 NOTE — Telephone Encounter (Signed)
Message from pt's wife reporting Lovenox for this month will be $1,000 this month. They are unable to afford this. Clarified with [employer], pt is in his "donut hole" and has to pay a percentage of cost for Lovenox. Request to managed care to see if medication can be obtained with pt assistance program. Requested managed care check coverage for Xarelto in case pt is unable to continue on Lovenox, per [facility]-- will need Rx to check coverage. There may be an assistance program for this drug.

## 2013-02-22 NOTE — Telephone Encounter (Signed)
Per staff message and POF I have scheduled appts. I also adjusted 10/2 appt JMW

## 2013-02-22 NOTE — Telephone Encounter (Addendum)
Was notified by managed care department that free drug is no longer available through the company for insured-only for uninsured. She will call her insurance company to determine if the next fill will be less. If not, this will be a financial hardship on them. MD will be made aware of issue. Provided #7 samples of Lovenox 120 mg and wife will pick these up today or tomorrow.

## 2013-02-22 NOTE — Telephone Encounter (Signed)
Unable to afford this #30 day supply of enoxaparin 120 mg. Out of pocket cost = [phone]. Insurance paid $800.00. Did not have to pay anything last month. Will not be able to afford this. Will see if pharmacy has some samples to use and get managed care to begin enrollment process in patient assistance program.

## 2013-02-22 NOTE — Telephone Encounter (Signed)
And emailed MW to adjust tx for 10/8...td

## 2013-02-25 ENCOUNTER — Telehealth (INDEPENDENT_AMBULATORY_CARE_PROVIDER_SITE_OTHER): Payer: Self-pay

## 2013-02-25 ENCOUNTER — Telehealth: Payer: Self-pay | Admitting: *Deleted

## 2013-02-25 ENCOUNTER — Other Ambulatory Visit: Payer: Self-pay | Admitting: *Deleted

## 2013-02-25 DIAGNOSIS — C20 Malignant neoplasm of rectum: Secondary | ICD-10-CM

## 2013-02-25 DIAGNOSIS — I82409 Acute embolism and thrombosis of unspecified deep veins of unspecified lower extremity: Secondary | ICD-10-CM

## 2013-02-25 DIAGNOSIS — I82401 Acute embolism and thrombosis of unspecified deep veins of right lower extremity: Secondary | ICD-10-CM

## 2013-02-25 MED ORDER — ENOXAPARIN SODIUM 120 MG/0.8ML ~~LOC~~ SOLN: 120.0000 mg | Freq: Every day | SUBCUTANEOUS | Status: DC

## 2013-02-25 MED ORDER — RIVAROXABAN 20 MG PO TABS: 20.0000 mg | ORAL_TABLET | Freq: Every day | ORAL | Status: DC

## 2013-02-25 NOTE — Telephone Encounter (Signed)
Made patient/wife aware that MD wants him to stay on blood thinner for at least 3 months-to complete his chemo treatments w/PICC line. He decided to stay with the Lovenox and pay the $800 out of pocket for this. Notified Psychologist, forensic.

## 2013-02-25 NOTE — Telephone Encounter (Signed)
Patient cost for Xarelto is $139/month. Made patient aware. He is not sure which way to go--pay the $800 for injection to get through the donut hole or get the pill. Asking how long will he be on blood thinner? This will help him with his decision.

## 2013-02-25 NOTE — Telephone Encounter (Signed)
Tonya from [facility] called stating patient is having trouble with ostomy with the rash he has. She is requesting a prescription for an Eakin ring be faxed to [facility] pharmacy [phone] attn Tan. Tan will call patient once prescription is received.

## 2013-02-25 NOTE — Progress Notes (Signed)
Per Dr. Truett Perna : May try Xarelto 20 mg daily to see if this is more affordable for him. Script sent to pharmacy to be processed.

## 2013-02-25 NOTE — Telephone Encounter (Signed)
Make it so

## 2013-02-26 ENCOUNTER — Other Ambulatory Visit (HOSPITAL_COMMUNITY): Payer: Self-pay | Admitting: Surgery

## 2013-02-26 NOTE — Telephone Encounter (Signed)
Make it so

## 2013-02-26 NOTE — Telephone Encounter (Signed)
Faxed to [facility] [phone] attn:Tan per Dr Renaldo Harrison. Rx for Eakin ring for ileostomy/stoma.

## 2013-02-27 ENCOUNTER — Telehealth (INDEPENDENT_AMBULATORY_CARE_PROVIDER_SITE_OTHER): Payer: Self-pay | Admitting: General Surgery

## 2013-02-27 ENCOUNTER — Other Ambulatory Visit: Payer: Self-pay | Admitting: Oncology

## 2013-02-27 DIAGNOSIS — C20 Malignant neoplasm of rectum: Secondary | ICD-10-CM

## 2013-02-27 NOTE — Telephone Encounter (Signed)
Son  Cower) call for order to be placed for the following ostomy supplies to [facility] in [place] [phone]):  (1) 9" Marlen ultramax convex pouches #53500 1 box of 5;  (2) Welland adhesive remover #(MO) WAD 050 1 box of 50; (3) Welland skin barrier #(MO) WBF 050.  Called pharmacy and they are unable to order/get these specific brands for her.  Discussed with son, who is going to check with [facility] in [place] for these brands.  He may call [facility] back and fill their needs with what they supplied previously and have them call us for the Rx refill.

## 2013-03-01 ENCOUNTER — Other Ambulatory Visit: Payer: Self-pay | Admitting: Oncology

## 2013-03-03 ENCOUNTER — Other Ambulatory Visit: Payer: Self-pay | Admitting: Oncology

## 2013-03-04 ENCOUNTER — Other Ambulatory Visit: Payer: Self-pay | Admitting: Nurse Practitioner

## 2013-03-04 ENCOUNTER — Telehealth (INDEPENDENT_AMBULATORY_CARE_PROVIDER_SITE_OTHER): Payer: Self-pay | Admitting: *Deleted

## 2013-03-04 NOTE — Telephone Encounter (Signed)
Tyler Diaz with [facility] called to update [place] that they are extending chemo.  So they wanted to let Dr. Michaell Cowing know that they will be extending the order for PICC dressing changes for another 60 days to cover for the extension.  Explained that a message will be sent to him to give him this update.

## 2013-03-05 ENCOUNTER — Other Ambulatory Visit: Payer: Self-pay | Admitting: *Deleted

## 2013-03-05 ENCOUNTER — Encounter: Payer: Self-pay | Admitting: *Deleted

## 2013-03-05 DIAGNOSIS — C20 Malignant neoplasm of rectum: Secondary | ICD-10-CM

## 2013-03-05 MED ORDER — HYDROCODONE-ACETAMINOPHEN 5-325 MG PO TABS: 1.0000 | ORAL_TABLET | Freq: Four times a day (QID) | ORAL | Status: DC | PRN

## 2013-03-05 MED ORDER — ALPRAZOLAM 0.5 MG PO TABS: ORAL_TABLET | ORAL | Status: DC

## 2013-03-05 NOTE — Progress Notes (Signed)
RECEIVED A FAX FROM [facility] CONCERNING A PRIOR AUTHORIZATION FOR ONDANSETRON. THIS REQUEST WAS PLACED IN THE MANAGED CARE BIN. 

## 2013-03-06 ENCOUNTER — Encounter: Payer: Self-pay | Admitting: Oncology

## 2013-03-06 NOTE — Progress Notes (Signed)
Faxed ondansetron pa form to [employer] °

## 2013-03-07 ENCOUNTER — Encounter: Payer: Self-pay | Admitting: Oncology

## 2013-03-07 NOTE — Progress Notes (Signed)
[employer] approved ondansetron 8mg  from 03/06/13-03/06/14

## 2013-03-10 ENCOUNTER — Other Ambulatory Visit: Payer: Self-pay | Admitting: Oncology

## 2013-03-11 ENCOUNTER — Ambulatory Visit (INDEPENDENT_AMBULATORY_CARE_PROVIDER_SITE_OTHER): Payer: Medicare Other | Admitting: Surgery

## 2013-03-11 ENCOUNTER — Encounter (INDEPENDENT_AMBULATORY_CARE_PROVIDER_SITE_OTHER): Payer: Self-pay | Admitting: Surgery

## 2013-03-11 VITALS — BP 104/64 | HR 108 | Temp 97.4°F | Resp 14 | Ht 70.0 in | Wt 167.6 lb

## 2013-03-11 DIAGNOSIS — B029 Zoster without complications: Secondary | ICD-10-CM

## 2013-03-11 DIAGNOSIS — D630 Anemia in neoplastic disease: Secondary | ICD-10-CM

## 2013-03-11 DIAGNOSIS — C20 Malignant neoplasm of rectum: Secondary | ICD-10-CM

## 2013-03-11 DIAGNOSIS — Z932 Ileostomy status: Secondary | ICD-10-CM

## 2013-03-11 HISTORY — DX: Zoster without complications: B02.9

## 2013-03-11 MED ORDER — ACETAMINOPHEN-CODEINE #3 300-30 MG PO TABS: 1.0000 | ORAL_TABLET | Freq: Four times a day (QID) | ORAL | Status: DC | PRN

## 2013-03-11 MED ORDER — FERROUS SULFATE 325 (65 FE) MG PO TABS: 325.0000 mg | ORAL_TABLET | Freq: Three times a day (TID) | ORAL | Status: DC

## 2013-03-11 NOTE — Patient Instructions (Addendum)
Increase iron to three times a day.  Continue Imodium two pills four times a day to control diarrhea.  Take extra 1Liter bag of crystalloid IV fluids today to avoid dehydration.  Keep appointment to see medical oncology / Dr. Truett Perna on Wednesday.  They are planning to order blood work.  GETTING TO GOOD BOWEL HEALTH. Irregular bowel habits such as constipation and diarrhea can lead to many problems over time.  Having one soft bowel movement a day is the most important way to prevent further problems.  The anorectal canal is designed to handle stretching and feces to safely manage our ability to get rid of solid waste (feces, poop, stool) out of our body.  BUT, hard constipated stools can act like ripping concrete bricks and diarrhea can be a burning fire to this very sensitive area of our body, causing inflamed hemorrhoids, anal fissures, increasing risk is perirectal abscesses, abdominal pain/bloating, an making irritable bowel worse.     The goal: ONE SOFT BOWEL MOVEMENT A DAY!  To have soft, regular bowel movements:    Drink at least 8 tall glasses of water a day.     Take plenty of fiber.  Fiber is the undigested part of plant food that passes into the colon, acting s "natures broom" to encourage bowel motility and movement.  Fiber can absorb and hold large amounts of water. This results in a larger, bulkier stool, which is soft and easier to pass. Work gradually over several weeks up to 6 servings a day of fiber (25g a day even more if needed) in the form of: o Vegetables -- Root (potatoes, carrots, turnips), leafy green (lettuce, salad greens, celery, spinach), or cooked high residue (cabbage, broccoli, etc) o Fruit -- Fresh (unpeeled skin & pulp), Dried (prunes, apricots, cherries, etc ),  or stewed ( applesauce)  o Whole grain breads, pasta, etc (whole wheat)  o Bran cereals    Bulking Agents -- This type of water-retaining fiber generally is easily obtained each day by one of the following:   o Psyllium bran -- The psyllium plant is remarkable because its ground seeds can retain so much water. This product is available as Metamucil, Konsyl, Effersyllium, Per Diem Fiber, or the less expensive generic preparation in drug and health food stores. Although labeled a laxative, it really is not a laxative.  o Methylcellulose -- This is another fiber derived from wood which also retains water. It is available as Citrucel. o Polyethylene Glycol - and "artificial" fiber commonly called Miralax or Glycolax.  It is helpful for people with gassy or bloated feelings with regular fiber o Flax Seed - a less gassy fiber than psyllium   No reading or other relaxing activity while on the toilet. If bowel movements take longer than 5 minutes, you are too constipated   AVOID CONSTIPATION.  High fiber and water intake usually takes care of this.  Sometimes a laxative is needed to stimulate more frequent bowel movements, but    Laxatives are not a good long-term solution as it can wear the colon out. o Osmotics (Milk of Magnesia, Fleets phosphosoda, Magnesium citrate, MiraLax, GoLytely) are safer than  o Stimulants (Senokot, Castor Oil, Dulcolax, Ex Lax)    o Do not take laxatives for more than 7days in a row.    IF SEVERELY CONSTIPATED, try a Bowel Retraining Program: o Do not use laxatives.  o Eat a diet high in roughage, such as bran cereals and leafy vegetables.  o Drink six (  6) ounces of prune or apricot juice each morning.  o Eat two (2) large servings of stewed fruit each day.  o Take one (1) heaping tablespoon of a psyllium-based bulking agent twice a day. Use sugar-free sweetener when possible to avoid excessive calories.  o Eat a normal breakfast.  o Set aside 15 minutes after breakfast to sit on the toilet, but do not strain to have a bowel movement.  o If you do not have a bowel movement by the third day, use an enema and repeat the above steps.    Controlling diarrhea o Switch to liquids and  simpler foods for a few days to avoid stressing your intestines further. o Avoid dairy products (especially milk & ice cream) for a short time.  The intestines often can lose the ability to digest lactose when stressed. o Avoid foods that cause gassiness or bloating.  Typical foods include beans and other legumes, cabbage, broccoli, and dairy foods.  Every person has some sensitivity to other foods, so listen to our body and avoid those foods that trigger problems for you. o Adding fiber (Citrucel, Metamucil, psyllium, Miralax) gradually can help thicken stools by absorbing excess fluid and retrain the intestines to act more normally.  Slowly increase the dose over a few weeks.  Too much fiber too soon can backfire and cause cramping & bloating. o Probiotics (such as active yogurt, Align, etc) may help repopulate the intestines and colon with normal bacteria and calm down a sensitive digestive tract.  Most studies show it to be of mild help, though, and such products can be costly. o Medicines:   Bismuth subsalicylate (ex. Kayopectate, Pepto Bismol) every 30 minutes for up to 6 doses can help control diarrhea.  Avoid if pregnant.   Loperamide (Immodium) can slow down diarrhea.  Start with two tablets (4mg  total) 6 hours.  Avoid if you are having fevers or severe pain.  If you are not better or start feeling worse, stop all medicines and call your doctor for advice o Call your doctor if you are getting worse or not better.  Sometimes further testing (cultures, endoscopy, X-ray studies, bloodwork, etc) may be needed to help diagnose and treat the cause of the diarrhea.  Ostomy Support Information  Yes, you've heard that people get along just fine with only one of their eyes, or one of their lungs, or one of their kidneys. But you also know that you have only one intestine and only one bladder, and that leaves you feeling awfully empty, both physically and emotionally: You think no other people go around  without part of their intestine with the ends of their intestines sticking out through their abdominal walls.  Well, you are wrong! There are nearly three quarters of a million people in the [place] who have an ostomy; people who have had surgery to remove all or part of their colons or bladders. There is even a national association, the [place] [employer] of [place] with over 350 local affiliated support groups that are organized by volunteers who provide peer support and counseling. Cheral Marker has a toll free telephone num-ber, [phone] and an educational,  interactive website, www.ostomy.org   An ostomy is an opening in the belly (abdominal wall) made by surgery. Ostomates are people who have had this procedure. The opening (stoma) allows the kidney or bowel to discharge waste. An external pouch covers the stoma to collect waste. Pouches are are a simple bag and are odor free. Different companies have  disposable or reusable pouches to fit one's lifestyle. An ostomy can either be temporary or permanent.  THERE ARE THREE MAIN TYPES OF OSTOMIES  Colostomy. A colostomy is a surgically created opening in the large intestine (colon).  Ileostomy. An ileostomy is a surgically created opening in the small intestine.  Urostomy. A urostomy is a surgically created opening to divert urine away from the bladder. FREQUENTLY ASKED QUESTIONS   Why haven't you met any of these folks who have an ostomy?  Well, maybe you have! You just did not recognize them because an ostomy doesn't show. It can be kept secret if you wish. Why, maybe some of your best friends, office associates or neighbors have an ostomy ... you never can tell.   People facing ostomy surgery have many quality-of-life questions like:  Will you bulge? Smell? Make noises? Will you feel waste leaving your body? Will you be a captive of the toilet? Will you starve? Be a social outcast? Get/stay married? Have babies? Easily bathe, go swimming, bend  over?  OK, let's look at what you can expect:  Will you bulge?  Remember, without part of the intestine or bladder, and its contents, you should have a flatter tummy than before. You can expect to wear, with little exception, what you wore before surgery ... and this in-cludes tight clothing and bathing suits.  Will you smell?  Today, thanks to modern odor proof pouching systems, you can walk into an ostomy support group meeting and not smell anything that is foul or offensive. And, for those with an ileostomy or colostomy who are concerned about odor when emptying their pouch, there are in-pouch deodorants that can be used to eliminate any waste odors that may exist.  Will you make noises?  Everyone produces gas, especially if they are an air-swallower. But intestinal sounds that occur from time to time are no differ-ent than a gurgling tummy, and quite often your clothing will muffle any sounds.   Will you feel the waste discharges?  For those with a colostomy or ileostomy there might be a slight pressure when waste leaves your body, but understand that the intestines have no nerve endings, so there will be no unpleasant sensations. Those with a urostomy will probably be unaware of any kidney drainage.  Will you be a captive of the toilet?  Immediately post-op you will spend more time in the bathroom than you will after your body recovers from surgery. Every person is different, but on average those with an ileostomy or urostomy may empty their pouches 4 to 6 times a day; a little  less if you have a colostomy. The average wear time between pouch system changes is 3 to 5 days and the changing process should take less than 30 minutes.  Will I need to be on a special diet? Most people return to their normal diet when they have recovered from surgery. Be sure to chew your food well, eat a well-balanced diet and drink plenty of fluids. If you experience problems with a certain food, wait a couple of  weeks and try it again. Will there be odor and noises? Pouching systems are designed to be odor-proof or odor-resistant. There are deodorants that can be used in the pouch. Medications are also available to help reduce odor. Limit gas-producing foods and carbonated beverages. You will experience less gas and fewer noises as you heal from surgery. How much time will it take to care for my ostomy? At first, you may  spend a lot of time learning about your ostomy and how to take care of it. As you become more comfortable and skilled at changing the pouching system, it will take very little time to care for it.  Will I be able to return to work? People with ostomies can perform most jobs. As soon as you have healed from surgery, you should be able to return to work. Heavy lifting (more than 10 pounds) may be discouraged.  What about intimacy? Sexual relationships and intimacy are important and fulfilling aspects of your life. They should continue after ostomy surgery. Intimacy-related concerns should be discussed openly between you and your partner.  Can I wear regular clothing? You do not need to wear special clothing. Ostomy pouches are fairly flat and barely noticeable. Elastic undergarments will not hurt the stoma or prevent the ostomy from functioning.  Can I participate in sports? An ostomy should not limit your involvement in sports. Many people with ostomies are runners, skiers, swimmers or participate in other active lifestyles. Talk with your caregiver first before doing heavy physical activity.  Will you starve?  Not if you follow doctor's orders at each stage of your post-op adjustment. There is no such thing as an "ostomy diet". Some people with an ostomy will be able to eat and tolerate anything; others may find diffi-culty with some foods. Each person is an individual and must determine, by trial, what is best for them. A good practice for all is to drink plenty of water.  Will you be a  social outcast?  Have you met anyone who has an ostomy and is a social outcast? Why should you be the first? Only your attitude and self image will effect how you are treated. No confi-dent person is an Investment banker, corporate.   PROFESSIONAL HELP  Resources are available if you need help or have questions about your ostomy.    Specially trained nurses called Wound, Ostomy Continence Nurses (WOCN) are available for consultation in most major medical centers.   Consider getting an ostomy consult with Page Spiro at [facility] to help troubleshoot collagen fittings and other issues with your ostomy: [phone]   The [employer] Ostomy Association (UOA) is a group made up of many local chapters throughout the [place]. These local groups hold meetings and provide support to prospective and existing ostomates. They sponsor educational events and have qualified visitors to make personal or telephone visits. Contact the UOA for the chapter nearest you and for other educational publications.  More detailed information can be found in Colostomy Guide, a publication of the [employer] (UOA). Contact UOA at 1-(937) 662-3163 or visit their web site at YellowSpecialist.at. The website contains links to other sites, suppliers and resources. Document Released: 05/26/2003 Document Revised: 08/15/2011 Document Reviewed: 09/24/2008 [facility] Patient Information 2013 [place], [place].

## 2013-03-11 NOTE — Progress Notes (Signed)
Subjective:     Patient ID: Tyler Diaz, male   DOB: 1946-02-27, 67 y.o.   MRN: [id number]  HPI  Tyler Diaz  04-21-1946 [id number]  Patient Care Team: Ernestina Penna, MD as PCP - General (Family Medicine) Ardeth Sportsman, MD as Consulting Physician (Colon and Rectal Surgery) Shirley Friar, MD as Consulting Physician (Gastroenterology) Lance Bosch, MD as Consulting Physician (Radiation Oncology) Cira Rue, RN as Registered Nurse (Oncology) Ladene Artist, MD as Consulting Physician (Oncology)  Procedure (Date: 12/27/2012):  POST-OPERATIVE DIAGNOSIS: very distal rectal cancer  Umbilical incarcerated hernia  LIH - incarcerated  RIH   PROCEDURE: Procedure(s):  LAPAROSCOPIC LOW ANTERIOR RESECTION,  COLO-ANAL ANASTOMOSIS,  DIVERTING LOOP ILEOSTOMY,  SPLENIC LEXURE MOBILIZATION,  PRIMARY INCARCERATED UMBILICAL HERNIA REPAIR   This patient returns for surgical re-evaluation.  He has been feeling worse the past two days.  Increase diarrhea.  Or tired.  He can still get out and walk but does not have as much energy.  No nausea or vomiting.  Tolerating solids.  No fevers or chills.  Using Imodium and iron rather regularly.  Right arm swelling much improved status post Lovenox for DVT in PICC line.  Getting IV fluid infusions several times a week.  Developed sharp back and flank pain and found to have shingles/zoster.  On antiviral.  Needing narcotic pain medicines.  Been trying hydrocodone and tramadol.  They do not work as welTylenol 3 seems to be the best tolerated, but almost run out.  Has been trying either,  More coming out of the ileostomy.  Urinating okay.  Feels a little weak and shaky at times.  Not dizziness.  No syncope.  l.  Son concerned.  Due to see medical oncology in two days.    Patient Active Problem List   Diagnosis Date Noted  . Herpes zoster - R flank - with severe pain 03/11/2013  . Ileostomy - diverting loop - in place [id number] 01/14/2013  . Anemia in  neoplastic disease 01/14/2013  . Umbilical hernia, incarcerated - s/p primary repair July 2014 01/03/2013  . Bilateral inguinal hernia (BIH) 01/03/2013  . Mitral regurgitation due to partially flail posterior mitral valve leaflet 07/30/2012  . Hyperlipidemia   . Rectal cancer - distal s/p lap LAR/coloanal July 2014 - [id number]. 07/27/2012  . Hypertension   . Vertigo     Past Medical History  Diagnosis Date  . Mitral regurgitation due to partially flail posterior mitral valve leaflet   . Rectal cancer   . Hyperlipidemia   . Colon cancer 07/18/12 bx    rectum=Invasive adenocarcinoma w/ extracellular mucin,polyp=benign  . Hypertension     essential (benign)  . Rheumatic fever as child    hx  . Hearing loss     partial greater on left  . Tinnitus     left ear  . Sinusitis     seasonal  . Rectal bleeding     last month hx  . Arthritis     hands/knees  . Hematochezia     recent  . Heart murmur   . Bronchitis   . Hypothyroidism as child    hx of    Past Surgical History  Procedure Laterality Date  . Carpal tunnel release Right 2002  . Tonsillectomy      as child  . Inguinal hernia repair  1982    left side  . Laparoscopic low anterior resection N/A 12/27/2012    Procedure: LAPAROSCOPIC LOW ANTERIOR RESECTION, COLO-ANAL  ANASTOMOSIS, DIVERTING LOOP ILEOSTOMY,SPLENIC FLEXURE MOBILIZATION, PRIMARY INCARCERATED UMBILICAL HERNIA REPAIR;  Surgeon: Ardeth Sportsman, MD;  Location: [facility];  Service: General;  Laterality: N/A;  . Laparoscopic low anterior rescection with coloanal anastomosis N/A 12/27/2012    Procedure: LAPAROSCOPIC LOW ANTERIOR RESCECTION WITH COLOANAL ANASTOMOSIS;  Surgeon: Ardeth Sportsman, MD;  Location: [facility];  Service: General;  Laterality: N/A;  . Ileostomy N/A 12/27/2012    Procedure: DIVERTING LOOP ILEOSTOMY;  Surgeon: Ardeth Sportsman, MD;  Location: [facility];  Service: General;  Laterality: N/A;  . Umbilical hernia repair  12/27/2012    Procedure: PRIMARY  REPAIR INCARCERATED UMBILICAL HERNIA;  Surgeon: Ardeth Sportsman, MD;  Location: [facility];  Service: General;;    History   Social History  . Marital Status: Married    Spouse Name: N/A    Number of Children: 2  . Years of Education: N/A   Occupational History  . Retired     Development worker, international aid   Social History Main Topics  . Smoking status: Former Smoker -- 1.50 packs/day for 20 years    Types: Cigarettes    Quit date: 07/27/1972  . Smokeless tobacco: Never Used  . Alcohol Use: No  . Drug Use: No     Comment: quit smoking 35 years ago  . Sexual Activity: Not on file   Other Topics Concern  . 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  Married    Family History  Problem Relation Age of Onset  . Cancer Mother     abdominal ca ?ovarian?  . Cancer Sister     liver cancer    Current Outpatient Prescriptions  Medication Sig Dispense Refill  . acetaminophen (TYLENOL) 500 MG tablet Take 500 mg by mouth every 6 (six) hours as needed for pain.      Marland Kitchen acetaminophen-codeine (TYLENOL #3) 300-30 MG per tablet Take 1-2 tablets by mouth every 6 (six) hours as needed.  50 tablet  1  . ALPRAZolam (XANAX) 0.5 MG tablet TAKE ONE TABLET TWICE DAILY PRN  30 tablet  0  . capecitabine (XELODA) 500 MG tablet 2,000 mg Q AM 1,500 mg Q PM (3,500 mg daily) for 14 days then 7 days off.  98 tablet  1  . enoxaparin (LOVENOX) 120 MG/0.8ML injection Inject 0.8 mLs (120 mg total) into the skin daily.  30 Syringe  2  . ferrous sulfate 325 (65 FE) MG tablet Take 1 tablet (325 mg total) by mouth 3 (three) times daily with meals.  60 tablet  2  . Heparin Lock Flush (HEPARIN FLUSH, PORCINE,) 100 UNIT/ML injection 2.5 mLs (250 Units total) by Intracatheter route every Monday, Wednesday, and Friday. Use 1/2 of 5 ml syringe and flush PICC line every MWF  12 Syringe  2  . hydrocortisone (PROCTOZONE-HC) 2.5 % rectal cream Place rectally 2 (two) times daily.  30  g  0  . LACTATED RINGERS IV Inject 1,000 mLs into the vein daily. At home      . lisinopril (PRINIVIL,ZESTRIL) 20 MG tablet Take 1 tablet (20 mg total) by mouth daily.  30 tablet  1  . loperamide (IMODIUM) 2 MG capsule TAKE ONE TO TWO CAPSULES BY MOUTH EVERY 8 HOURS AS NEEDED FOR DIARRHEA OR LOOSE STOOLS (USE IF >2 BOWEL MOVEMENTS EVERY 8 HOURS)  40 capsule  2  . Multiple Vitamin (MULTIVITAMIN WITH MINERALS) TABS Take 1 tablet by mouth daily.      Marland Kitchen  ondansetron (ZOFRAN) 8 MG tablet Take 1 tablet (8 mg total) by mouth every 8 (eight) hours as needed for nausea.  30 tablet  2  . OXALIPLATIN IV Inject into the vein.      . pravastatin (PRAVACHOL) 20 MG tablet Take 20 mg by mouth every morning.       . promethazine (PHENERGAN) 25 MG tablet Take 25 mg by mouth every 6 (six) hours as needed for nausea.       . Sodium Chloride Flush (NORMAL SALINE FLUSH) 0.9 % SOLN Inject 10 mLs into the vein every Monday, Wednesday, and Friday. Inject into PICC line  12 Syringe  3  . valACYclovir (VALTREX) 1000 MG tablet        No current facility-administered medications for this visit.     Allergies  Allergen Reactions  . Shrimp [Shellfish Allergy] Other (See Comments)    Whelps and sick    BP 104/64  Pulse 108  Temp(Src) 97.4 F (36.3 C) (Temporal)  Resp 14  Ht 5\' 10"  (1.778 m)  Wt 167 lb 9.6 oz (76.023 kg)  BMI 24.05 kg/m2  No results found.   Review of Systems  Constitutional: Positive for activity change and fatigue. Negative for fever, chills, diaphoresis, appetite change and unexpected weight change.  HENT: Negative for sore throat, trouble swallowing and neck pain.   Eyes: Negative for photophobia and visual disturbance.  Respiratory: Negative for choking and shortness of breath.   Cardiovascular: Negative for chest pain, palpitations and leg swelling.  Gastrointestinal: Positive for diarrhea. Negative for nausea, vomiting, abdominal pain, constipation, blood in stool, abdominal distention,  anal bleeding and rectal pain.  Genitourinary: Negative for dysuria, urgency, frequency, enuresis, difficulty urinating and testicular pain.  Musculoskeletal: Positive for myalgias and back pain. Negative for arthralgias and gait problem.  Skin: Positive for wound. Negative for color change and rash.  Neurological: Negative for dizziness, speech difficulty, weakness and numbness.  Hematological: Negative for adenopathy.  Psychiatric/Behavioral: Negative for hallucinations, confusion and agitation.       Objective:   Physical Exam  Constitutional: He is oriented to person, place, and time. He appears well-developed and well-nourished. No distress.  HENT:  Head: Normocephalic.  Mouth/Throat: Oropharynx is clear and moist. No oropharyngeal exudate.  Eyes: Conjunctivae and EOM are normal. Pupils are equal, round, and reactive to light. No scleral icterus.  Neck: Normal range of motion. No tracheal deviation present.  Cardiovascular: Normal rate, normal heart sounds and intact distal pulses.   Pulmonary/Chest: Effort normal. No respiratory distress.  Abdominal: Soft. He exhibits no distension. There is no tenderness. There is no rigidity, no rebound, no guarding, no tenderness at McBurney's point and negative Murphy's sign. No hernia. Hernia confirmed negative in the right inguinal area and confirmed negative in the left inguinal area.    Incisions clean with normal healing ridges.  No hernias  Musculoskeletal: Normal range of motion. He exhibits no tenderness.  Neurological: He is alert and oriented to person, place, and time. No cranial nerve deficit or sensory deficit. He exhibits normal muscle tone. Coordination normal. GCS eye subscore is 4. GCS verbal subscore is 5. GCS motor subscore is 6.  Had mild tremor of left arm, and then resolved.  Skin: Skin is warm and dry. No rash noted. He is not diaphoretic.     Psychiatric: His behavior is normal. Judgment and thought content normal. His  mood appears not anxious. His affect is not labile. His speech is not slurred. Cognition and memory  are normal. He exhibits a depressed mood. He is communicative.       Assessment:     Struggling with numerous issues     Plan:     I long discussion with the patient.  Also with his son in the room.  Discussed with the medical oncologist as well.  Questions answered.  They expressed understanding and appreciation.  Continue treatment for shingles.  Continue pain control for this.  This seems to be the main source of pain.  Hydrocodone does not agree with him.  Tramadol does not work.  He does prefer if the Tylenol 3, but is running out.  I renewed some.  Diarrhea of uncertain etiology.  Perhaps related to keep capecitabine oral chemotherapy.  Increase Imodium to two q. A.c./each bedtime.  Increase iron to 3 times a day.  Consider pectin-based Tx (Pepto/kayopectate as well.  Is getting markedly worse, switch to paregoric.  He did not struggled like this at the beginning stages of his ileostomy, supple full is just a temporary annoyance.  Follow closely  Increase oral fluid intake.  Increase IV fluid bolus.  Give extra liter IVF bolus today.  I discussed the patient with his medical oncologist, Dr. Truett Perna.  They are planning to see him in two days.  Will obtain blood work.  If the patient has worsening, call us again.  May require readmission.  He does not look that toxic to me, Blood certainly his recovery has stagnated.  Continue chemotherapy with capecitobene / oxalipatin as tolerated.  Soon to startl third course of five.  He may not be able to tolerate it.  I would wait several weeks after completion of chemotherapy for attempting ileostomy takedown.  He could benefit from pelvic floor PT/training, but will hold off on that until recovers from more recent episode.

## 2013-03-13 ENCOUNTER — Encounter (INDEPENDENT_AMBULATORY_CARE_PROVIDER_SITE_OTHER): Payer: Self-pay

## 2013-03-13 ENCOUNTER — Other Ambulatory Visit: Payer: Self-pay | Admitting: *Deleted

## 2013-03-13 ENCOUNTER — Other Ambulatory Visit (HOSPITAL_BASED_OUTPATIENT_CLINIC_OR_DEPARTMENT_OTHER): Payer: Medicare Other | Admitting: Lab

## 2013-03-13 ENCOUNTER — Encounter: Payer: Self-pay | Admitting: *Deleted

## 2013-03-13 ENCOUNTER — Ambulatory Visit (HOSPITAL_BASED_OUTPATIENT_CLINIC_OR_DEPARTMENT_OTHER): Payer: Medicare Other | Admitting: Nurse Practitioner

## 2013-03-13 ENCOUNTER — Encounter: Payer: Medicare Other | Admitting: Nutrition

## 2013-03-13 ENCOUNTER — Ambulatory Visit (HOSPITAL_BASED_OUTPATIENT_CLINIC_OR_DEPARTMENT_OTHER): Payer: Medicare Other

## 2013-03-13 ENCOUNTER — Ambulatory Visit: Payer: Medicare Other | Admitting: Nutrition

## 2013-03-13 VITALS — BP 98/58 | HR 107 | Temp 97.5°F | Resp 18 | Ht 70.0 in | Wt 172.6 lb

## 2013-03-13 DIAGNOSIS — C20 Malignant neoplasm of rectum: Secondary | ICD-10-CM

## 2013-03-13 DIAGNOSIS — R197 Diarrhea, unspecified: Secondary | ICD-10-CM

## 2013-03-13 DIAGNOSIS — I82409 Acute embolism and thrombosis of unspecified deep veins of unspecified lower extremity: Secondary | ICD-10-CM

## 2013-03-13 DIAGNOSIS — R911 Solitary pulmonary nodule: Secondary | ICD-10-CM

## 2013-03-13 DIAGNOSIS — Z5111 Encounter for antineoplastic chemotherapy: Secondary | ICD-10-CM

## 2013-03-13 DIAGNOSIS — I10 Essential (primary) hypertension: Secondary | ICD-10-CM

## 2013-03-13 LAB — CBC WITH DIFFERENTIAL/PLATELET
Basophils Absolute: 0.1 10*3/uL (ref 0.0–0.1)
EOS%: 5.7 % (ref 0.0–7.0)
Eosinophils Absolute: 0.4 10*3/uL (ref 0.0–0.5)
HCT: 29.1 % — ABNORMAL LOW (ref 38.4–49.9)
HGB: 10.1 g/dL — ABNORMAL LOW (ref 13.0–17.1)
NEUT#: 3.4 10*3/uL (ref 1.5–6.5)
NEUT%: 54.6 % (ref 39.0–75.0)
RDW: 24.7 % — ABNORMAL HIGH (ref 11.0–14.6)
WBC: 6.2 10*3/uL (ref 4.0–10.3)
lymph#: 0.9 10*3/uL (ref 0.9–3.3)

## 2013-03-13 LAB — COMPREHENSIVE METABOLIC PANEL (CC13)
ALT: 41 U/L (ref 0–55)
AST: 32 U/L (ref 5–34)
Albumin: 3.4 g/dL — ABNORMAL LOW (ref 3.5–5.0)
Anion Gap: 9 mEq/L (ref 3–11)
CO2: 21 mEq/L — ABNORMAL LOW (ref 22–29)
Calcium: 9.3 mg/dL (ref 8.4–10.4)
Chloride: 102 mEq/L (ref 98–109)
Creatinine: 1.1 mg/dL (ref 0.7–1.3)
Potassium: 4.4 mEq/L (ref 3.5–5.1)
Sodium: 133 mEq/L — ABNORMAL LOW (ref 136–145)
Total Bilirubin: 0.42 mg/dL (ref 0.20–1.20)
Total Protein: 6.5 g/dL (ref 6.4–8.3)

## 2013-03-13 MED ORDER — DIPHENOXYLATE-ATROPINE 2.5-0.025 MG PO TABS: 1.0000 | ORAL_TABLET | Freq: Four times a day (QID) | ORAL | Status: DC | PRN

## 2013-03-13 MED ORDER — DEXAMETHASONE SODIUM PHOSPHATE 10 MG/ML IJ SOLN
INTRAMUSCULAR | Status: AC
  Filled 2013-03-13: qty 1

## 2013-03-13 MED ORDER — PALONOSETRON HCL INJECTION 0.25 MG/5ML
INTRAVENOUS | Status: AC
  Filled 2013-03-13: qty 5

## 2013-03-13 MED ORDER — PALONOSETRON HCL INJECTION 0.25 MG/5ML
0.2500 mg | Freq: Once | INTRAVENOUS | Status: AC
  Administered 2013-03-13: 0.25 mg via INTRAVENOUS

## 2013-03-13 MED ORDER — SODIUM CHLORIDE 0.9 % IJ SOLN
10.0000 mL | INTRAMUSCULAR | Status: DC | PRN
  Administered 2013-03-13: 10 mL
  Filled 2013-03-13: qty 10

## 2013-03-13 MED ORDER — DEXTROSE 5 % IV SOLN
Freq: Once | INTRAVENOUS | Status: AC
  Administered 2013-03-13: 14:00:00 via INTRAVENOUS

## 2013-03-13 MED ORDER — CAPECITABINE 500 MG PO TABS: ORAL_TABLET | ORAL | Status: DC

## 2013-03-13 MED ORDER — ALPRAZOLAM 0.5 MG PO TABS: ORAL_TABLET | ORAL | Status: DC

## 2013-03-13 MED ORDER — DEXAMETHASONE SODIUM PHOSPHATE 10 MG/ML IJ SOLN
10.0000 mg | Freq: Once | INTRAMUSCULAR | Status: AC
  Administered 2013-03-13: 10 mg via INTRAVENOUS

## 2013-03-13 MED ORDER — HEPARIN SOD (PORK) LOCK FLUSH 100 UNIT/ML IV SOLN
500.0000 [IU] | Freq: Once | INTRAVENOUS | Status: AC | PRN
  Administered 2013-03-13: 500 [IU]
  Filled 2013-03-13: qty 5

## 2013-03-13 MED ORDER — OXALIPLATIN CHEMO INJECTION 100 MG/20ML
100.0000 mg/m2 | Freq: Once | INTRAVENOUS | Status: AC
  Administered 2013-03-13: 200 mg via INTRAVENOUS
  Filled 2013-03-13: qty 40

## 2013-03-13 NOTE — Progress Notes (Addendum)
OFFICE PROGRESS NOTE  Interval history:   Tyler Diaz is a 67 year old man with stage III rectal cancer on active treatment with adjuvant CAPOX. He completed cycle 1 beginning 01/30/2013. He completed cycle 2 beginning 02/20/2013. He is seen today prior to proceeding with cycle 3.  Tyler Diaz reports developing significant diarrhea last week. He estimates emptying the ileostomy collection bag 17-18 times per day for 2 days when it was half full. He took Imodium and more recently began Pepto-Bismol. The diarrhea is much better. He notes that the stools are thicker. He denies any hand or foot pain or redness. No mouth sores. He has mild tingling/numbness in the fingertips. This does not interfere with activity. He continues to have pain related to shingles. He tried Vicodin but noticed nausea after a dose. He prefers Tylenol No. 3. Appetite has improved over the past several days. He continues IV fluids every other day. No shortness of breath, fever or cough. He continues Lovenox. He denies bleeding.  Objective: Blood pressure 98/58, pulse 107, temperature 97.5 F (36.4 C), temperature source Oral, resp. rate 18, height 5\' 10"  (1.778 m), weight 172 lb 9.6 oz (78.291 kg).  Oropharynx is without thrush or ulceration. Mucous membranes are moist. Lungs are clear. Regular cardiac rhythm. Question faint systolic murmur. Abdomen is soft and nontender. No hepatomegaly. Ileostomy collection bag is empty. No leg edema. Right upper extremity PICC site is without erythema. Palms are without erythema. Healing zoster rash at the right abdomen/back.  Lab Results: Lab Results  Component Value Date   WBC 6.2 03/13/2013   HGB 10.1* 03/13/2013   HCT 29.1* 03/13/2013   MCV 100.7* 03/13/2013   PLT 232 03/13/2013    Chemistry:    Chemistry      Component Value Date/Time   NA 135* 02/20/2013 0940   NA 135 01/14/2013 1155   K 4.4 02/20/2013 0940   K 4.8 01/14/2013 1155   CL 99 01/14/2013 1155   CO2 27 02/20/2013 0940   CO2  26 01/14/2013 1155   BUN 14.5 02/20/2013 0940   BUN 21 01/14/2013 1155   CREATININE 0.9 02/20/2013 0940   CREATININE 1.13 01/14/2013 1155   CREATININE 1.13 01/03/2013 0343      Component Value Date/Time   CALCIUM 9.7 02/20/2013 0940   CALCIUM 9.4 01/14/2013 1155   ALKPHOS 81 02/20/2013 0940   ALKPHOS 44 12/31/2012 0820   AST 30 02/20/2013 0940   AST 15 12/31/2012 0820   ALT 39 02/20/2013 0940   ALT 11 12/31/2012 0820   BILITOT 0.68 02/20/2013 0940   BILITOT 0.5 12/31/2012 0820       Studies/Results: No results found.  Medications: I have reviewed the patient's current medications.  Assessment/Plan:  1. Stage III rectal cancer, presenting with a locally advanced rectal tumor.  CT/MRI evidence of perirectal lymphadenopathy.  Status post neoadjuvant Xeloda and radiation.  Status post low anterior resection and coloanal anastomosis 12/27/2012 with pathology confirming a ypT3, ypN1b tumor, microsatellite stable.  Initiation of adjuvant CAPOX chemotherapy 01/30/2013. Cycle 2 adjuvant CAPOX 02/20/2013. 2. Small bilateral lung nodules and a mildly enlarged gastrohepatic node on the initial staging CT scans 08/02/2012.  CT chest on 01/23/2013 with stable bilateral pulmonary nodules. 3. Elevated pretreatment CEA.  Repeat CEA 1.1 on 01/21/2013. 4. Mitral regurgitation. 5. Hypertension. 6. History of rheumatic fever. 7. Pain and swelling at the right arm on 01/18/2013. Doppler confirmed a right upper extremity deep vein thrombosis.  Daily Lovenox started on 01/18/2013. 8. Acute herpes  zoster affecting right thoracic dermatomes September 2014. He completed a course of valacyclovir. 9. Pain related to herpes zoster. 10. Delayed nausea following cycle 1 CAPOX. Aloxi added to the premedication regimen beginning with cycle 2. 11. Diarrhea following cycle 2 CAPOX.  Disposition-Tyler Diaz has completed 2 cycles of adjuvant CAPOX. He developed significant diarrhea following cycle 2 likely a combination  of high output ileostomy and Xeloda. The diarrhea is better. Plan to proceed with cycle 3 adjuvant CAPOX today as scheduled.  Dr. Truett Perna recommends decreasing the Xeloda dose to 1500 mg every morning and 1000 mg every afternoon for 14 days followed by a 7 day break beginning with cycle 3.  Tyler Diaz and his son understand to discontinue Xeloda and contact the office with recurrent diarrhea. He has Imodium at home. We gave him a prescription for Lomotil 1 tablet 4 times daily as needed for diarrhea.  He was mildly hypotensive in the office today. He reports taking lisinopril as well as hydrochlorothiazide. We instructed him to discontinue both.  He will return for a followup visit and cycle 4 CAPOX in 3 weeks. He will contact the office in the interim as outlined above or with any other problems.   Patient seen with Dr. Truett Perna.  30 minutes of today's visit were spent face-to-face with the patient with greater than 50% of that time spent in counseling/coordination of care.    Lonna Cobb ANP/GNP-BC   This was a shared visit with Lonna Cobb.Tyler Diaz developed increased output from the ileostomy with the most recent cycle of chemotherapy. It is not clear whether the ileostomy output is related to chemotherapy or a "high output "ileostomy. He was instructed on the use of antidiarrheal agents and will contact us for recurrent diarrhea or hand/foot pain.  Mancel Bale, M.D.

## 2013-03-13 NOTE — Progress Notes (Signed)
Patient reports severe diarrhea after last cycle of chemotherapy.  His chemotherapy has been adjusted by his physician.  He reports improvement with medication and stool thickening foods such as applesauce.  He drinks approximately 6 bottles of water daily. Weight documented as 172.6 pounds on Oct 8 decreased from 174.7 pounds on Sept 17.  Nutrition Diagnosis:  Unintended weight loss continues.  Intervention:  Patient was encouraged to continue small amounts of high calorie, high protein foods daily.  I reviewed foods that will help thicken his stool.  Patient to continue increased hydration.  Teach back method used.  Monitoring, evaluation, goals: Patient will tolerate adequate calories and fluids to promote weight maintenance and avoid blockage.  Next visit: Wednesday, October 29, during chemotherapy.

## 2013-03-13 NOTE — Patient Instructions (Signed)
STOP LISINOPRIL AND HCTZ DECREASE XELODA TO 3 TABS EVERY MORNING AND 2 TABS EVERY EVENING FOR 14 DAYS

## 2013-03-13 NOTE — Telephone Encounter (Signed)
Refill request for capecitabine to collaborative nurse desk. Patient has appointment today with NP.

## 2013-03-13 NOTE — Addendum Note (Signed)
Addended by: Wandalee Ferdinand on: 03/13/2013 02:25 PM   Modules accepted: Orders

## 2013-03-18 NOTE — Telephone Encounter (Signed)
RECEIVED A FAX FROM [facility] CONCERNING A CONFIRMATION OF PRESCRIPTION SHIPMENT FOR CAPECITABINE ON 03/15/13.

## 2013-03-21 ENCOUNTER — Other Ambulatory Visit: Payer: Self-pay | Admitting: Nurse Practitioner

## 2013-03-29 ENCOUNTER — Other Ambulatory Visit: Payer: Self-pay | Admitting: *Deleted

## 2013-03-29 DIAGNOSIS — C20 Malignant neoplasm of rectum: Secondary | ICD-10-CM

## 2013-03-29 MED ORDER — CAPECITABINE 500 MG PO TABS: ORAL_TABLET | ORAL | Status: DC

## 2013-03-29 NOTE — Telephone Encounter (Signed)
RECEIVED A FAX FROM [facility] CONCERNING A CONFIRMATION OF FACSIMILE RECEIPT FOR PT. REFERRAL. 

## 2013-03-29 NOTE — Telephone Encounter (Signed)
THIS REFILL REQUEST FOR CAPECITABINE WAS PLACED ON DR.SHERRILL'S DESK. 

## 2013-03-29 NOTE — Addendum Note (Signed)
Addended by: Arvilla Meres on: 03/29/2013 12:33 PM   Modules accepted: Orders

## 2013-03-31 ENCOUNTER — Other Ambulatory Visit: Payer: Self-pay | Admitting: Oncology

## 2013-04-03 ENCOUNTER — Other Ambulatory Visit (HOSPITAL_BASED_OUTPATIENT_CLINIC_OR_DEPARTMENT_OTHER): Payer: Medicare Other | Admitting: Lab

## 2013-04-03 ENCOUNTER — Ambulatory Visit: Payer: Medicare Other | Admitting: Nutrition

## 2013-04-03 ENCOUNTER — Telehealth: Payer: Self-pay | Admitting: Oncology

## 2013-04-03 ENCOUNTER — Other Ambulatory Visit: Payer: Self-pay | Admitting: *Deleted

## 2013-04-03 ENCOUNTER — Ambulatory Visit (HOSPITAL_BASED_OUTPATIENT_CLINIC_OR_DEPARTMENT_OTHER): Payer: Medicare Other | Admitting: Oncology

## 2013-04-03 ENCOUNTER — Encounter (INDEPENDENT_AMBULATORY_CARE_PROVIDER_SITE_OTHER): Payer: Self-pay

## 2013-04-03 ENCOUNTER — Ambulatory Visit (HOSPITAL_BASED_OUTPATIENT_CLINIC_OR_DEPARTMENT_OTHER): Payer: Medicare Other

## 2013-04-03 VITALS — BP 127/65 | HR 109 | Temp 97.8°F | Resp 18 | Ht 70.0 in | Wt 179.3 lb

## 2013-04-03 DIAGNOSIS — C20 Malignant neoplasm of rectum: Secondary | ICD-10-CM

## 2013-04-03 DIAGNOSIS — I82409 Acute embolism and thrombosis of unspecified deep veins of unspecified lower extremity: Secondary | ICD-10-CM

## 2013-04-03 DIAGNOSIS — R918 Other nonspecific abnormal finding of lung field: Secondary | ICD-10-CM

## 2013-04-03 DIAGNOSIS — L270 Generalized skin eruption due to drugs and medicaments taken internally: Secondary | ICD-10-CM

## 2013-04-03 DIAGNOSIS — Z5111 Encounter for antineoplastic chemotherapy: Secondary | ICD-10-CM

## 2013-04-03 LAB — COMPREHENSIVE METABOLIC PANEL (CC13)
ALT: 35 U/L (ref 0–55)
AST: 38 U/L — ABNORMAL HIGH (ref 5–34)
Albumin: 3.2 g/dL — ABNORMAL LOW (ref 3.5–5.0)
CO2: 23 mEq/L (ref 22–29)
Calcium: 9.4 mg/dL (ref 8.4–10.4)
Chloride: 108 mEq/L (ref 98–109)
Potassium: 4 mEq/L (ref 3.5–5.1)
Sodium: 140 mEq/L (ref 136–145)
Total Protein: 6.5 g/dL (ref 6.4–8.3)

## 2013-04-03 LAB — CBC WITH DIFFERENTIAL/PLATELET
BASO%: 1.2 % (ref 0.0–2.0)
Basophils Absolute: 0.1 10*3/uL (ref 0.0–0.1)
HCT: 29 % — ABNORMAL LOW (ref 38.4–49.9)
HGB: 9.3 g/dL — ABNORMAL LOW (ref 13.0–17.1)
MCV: 103.6 fL — ABNORMAL HIGH (ref 79.3–98.0)
MONO#: 1.6 10*3/uL — ABNORMAL HIGH (ref 0.1–0.9)
NEUT#: 3 10*3/uL (ref 1.5–6.5)
NEUT%: 50.1 % (ref 39.0–75.0)
lymph#: 0.8 10*3/uL — ABNORMAL LOW (ref 0.9–3.3)

## 2013-04-03 MED ORDER — SODIUM CHLORIDE 0.9 % IJ SOLN
10.0000 mL | INTRAMUSCULAR | Status: DC | PRN
  Administered 2013-04-03: 10 mL
  Filled 2013-04-03: qty 10

## 2013-04-03 MED ORDER — PALONOSETRON HCL INJECTION 0.25 MG/5ML
0.2500 mg | Freq: Once | INTRAVENOUS | Status: AC
  Administered 2013-04-03: 0.25 mg via INTRAVENOUS

## 2013-04-03 MED ORDER — PALONOSETRON HCL INJECTION 0.25 MG/5ML
INTRAVENOUS | Status: AC
  Filled 2013-04-03: qty 5

## 2013-04-03 MED ORDER — DEXAMETHASONE SODIUM PHOSPHATE 10 MG/ML IJ SOLN
INTRAMUSCULAR | Status: AC
  Filled 2013-04-03: qty 1

## 2013-04-03 MED ORDER — DEXTROSE 5 % IV SOLN
Freq: Once | INTRAVENOUS | Status: AC
  Administered 2013-04-03: 12:00:00 via INTRAVENOUS

## 2013-04-03 MED ORDER — ACETAMINOPHEN-CODEINE #3 300-30 MG PO TABS: 1.0000 | ORAL_TABLET | Freq: Four times a day (QID) | ORAL | Status: DC | PRN

## 2013-04-03 MED ORDER — DEXAMETHASONE SODIUM PHOSPHATE 10 MG/ML IJ SOLN
10.0000 mg | Freq: Once | INTRAMUSCULAR | Status: AC
  Administered 2013-04-03: 10 mg via INTRAVENOUS

## 2013-04-03 MED ORDER — OXALIPLATIN CHEMO INJECTION 100 MG/20ML
80.0000 mg/m2 | Freq: Once | INTRAVENOUS | Status: AC
  Administered 2013-04-03: 160 mg via INTRAVENOUS
  Filled 2013-04-03: qty 32

## 2013-04-03 MED ORDER — HEPARIN SOD (PORK) LOCK FLUSH 100 UNIT/ML IV SOLN
250.0000 [IU] | Freq: Once | INTRAVENOUS | Status: AC | PRN
  Administered 2013-04-03: 250 [IU]
  Filled 2013-04-03: qty 5

## 2013-04-03 NOTE — Telephone Encounter (Signed)
Pharmacy able to provide a box of #10 syringes of Lovenox 120 mg. Given to patient.

## 2013-04-03 NOTE — Progress Notes (Signed)
   [facility]    OFFICE PROGRESS NOTE   INTERVAL HISTORY:   He returns for scheduled followup visit. He reports an improved appetite. The diarrhea has slowed since he has increased the use of Imodium and Lomotil. He is taking Lomotil twice daily and up to 8 Imodium tablets per day.  He completed another cycle of CAPOX beginning on 03/13/2013. He reports numbness in the fingers. He has developed pain and erythema at the soles. Erythema at the right greater than left hand. He reports some difficulty buttoning his shirt. He continues Lovenox anticoagulation. The zoster pain has improved, but he continues to take codeine.  Objective:  Vital signs in last 24 hours:  Blood pressure 127/65, pulse 109, temperature 97.8 F (36.6 C), temperature source Oral, resp. rate 18, height 5\' 10"  (1.778 m), weight 179 lb 4.8 oz (81.33 kg), SpO2 100.00%.    HEENT: No thrush or ulcers Resp: Lungs clear bilaterally Cardio: Regular rate and rhythm, tachycardia GI: No hepatomegaly, right lower quadrant ileostomy Vascular: Trace edema at the right lower leg and ankle Neuro: Moderate loss of vibratory sense at the fingertips bilaterally, he was able to button his shirt and pick up a penny off of the counter top.  Skin: Erythema at the right greater than left palm. Erythema at the soles bilaterally with superficial desquamation at the right sole.   Portacath/PICC-without erythema  Lab Results:  Lab Results  Component Value Date   WBC 6.0 04/03/2013   HGB 9.3* 04/03/2013   HCT 29.0* 04/03/2013   MCV 103.6* 04/03/2013   PLT 202 04/03/2013   ANC 3.0   Medications: I have reviewed the patient's current medications.  Assessment/Plan: 1. Stage III rectal cancer, presenting with a locally advanced rectal tumor.  CT/MRI evidence of perirectal lymphadenopathy.  Status post neoadjuvant Xeloda and radiation.  Status post low anterior resection and coloanal anastomosis 12/27/2012 with  pathology confirming a ypT3, ypN1b tumor, microsatellite stable.  Initiation of adjuvant CAPOX chemotherapy 01/30/2013.  Cycle 2 adjuvant CAPOX 02/20/2013. Cycle 3 adjuvant CAPOX 03/13/2013 2. Small bilateral lung nodules and a mildly enlarged gastrohepatic node on the initial staging CT scans 08/02/2012.  CT chest on 01/23/2013 with stable bilateral pulmonary nodules. 3. Elevated pretreatment CEA.  Repeat CEA 1.1 on 01/21/2013. 4. Mitral regurgitation. 5. Hypertension. 6. History of rheumatic fever. 7. Pain and swelling at the right arm on 01/18/2013. Doppler confirmed a right upper extremity deep vein thrombosis.  Daily Lovenox started on 01/18/2013. 8. Acute herpes zoster affecting right thoracic dermatomes September 2014. He completed a course of valacyclovir. 9. Pain related to herpes zoster. Improved 10. Delayed nausea following cycle 1 CAPOX. Aloxi added to the premedication regimen beginning with cycle 2. 11. Diarrhea following cycle 2 CAPOX. 12. Hand/foot syndrome secondary to Xeloda-we will reduce the Xeloda dose with cycle 4 CAPOX.   Disposition:  Mr. Klabunde has completed 3 cycles of adjuvant CAPOX. The diarrhea has improved with an adjustment in his medical regimen. His overall performance status appears improved. He has developed hand/foot syndrome. We decreased the Xeloda to 1000 mg in the morning and 1000 mg in the evening beginning with cycle 4 CAPOX.  We will dose reduce the oxaliplatin secondary to neuropathy symptoms.  Mr. Garber will return for an office visit and chemotherapy in 3 weeks.   Thornton Papas, MD  04/03/2013  12:01 PM

## 2013-04-03 NOTE — Progress Notes (Signed)
Patient reports an improvement in stool output in ileostomy.  He is taking 6-8 Imodium, daily, and is following a diet to improve output.  Weight has increased to 179.3 pounds from 172.6 pounds October 8.  Patient denies nutrition issues at this time.  Nutrition diagnosis: Unintended weight loss resolved.  Patient educated to continue small frequent meals with adequate calories and protein as tolerated.  He was encouraged to continue plenty of fluids.  Teach back method used.  No followup scheduled.  Patient has my phone number for questions or concerns.

## 2013-04-03 NOTE — Patient Instructions (Addendum)
Regarding your Xeloda dosing: Reduce dose to 1000 mg twice daily X 14 days (per Dr. Truett Perna).[facility]  Discharge Instructions for Patients Receiving Chemotherapy  Today you received the following chemotherapy agents Oxaliplatin.  To help prevent nausea and vomiting after your treatment, we encourage you to take your nausea medication Zofran or Phenergan as directed.  If you develop nausea and vomiting that is not controlled by your nausea medication, call the clinic.   BELOW ARE SYMPTOMS THAT SHOULD BE REPORTED IMMEDIATELY:  *FEVER GREATER THAN 100.5 F  *CHILLS WITH OR WITHOUT FEVER  NAUSEA AND VOMITING THAT IS NOT CONTROLLED WITH YOUR NAUSEA MEDICATION  *UNUSUAL SHORTNESS OF BREATH  *UNUSUAL BRUISING OR BLEEDING  TENDERNESS IN MOUTH AND THROAT WITH OR WITHOUT PRESENCE OF ULCERS  *URINARY PROBLEMS  *BOWEL PROBLEMS  UNUSUAL RASH Items with * indicate a potential emergency and should be followed up as soon as possible.  Feel free to call the clinic you have any questions or concerns. The clinic phone number is [phone].

## 2013-04-03 NOTE — Telephone Encounter (Signed)
Gave pt appt for lab and MD for November , eamiled Michelle regarding chemo

## 2013-04-04 ENCOUNTER — Other Ambulatory Visit: Payer: Self-pay | Admitting: *Deleted

## 2013-04-04 DIAGNOSIS — C20 Malignant neoplasm of rectum: Secondary | ICD-10-CM

## 2013-04-04 MED ORDER — CAPECITABINE 500 MG PO TABS: 1000.0000 mg | ORAL_TABLET | Freq: Two times a day (BID) | ORAL | Status: DC

## 2013-04-04 NOTE — Telephone Encounter (Signed)
RECEIVED A FAX FROM [employer] WHICH STATES IN THE PROCESS OF VERIFICATION FOR PT.'S INSURANCE.

## 2013-04-05 ENCOUNTER — Telehealth: Payer: Self-pay | Admitting: Oncology

## 2013-04-05 NOTE — Telephone Encounter (Signed)
Talked to pt and gave her appt for lab,md and chemo for November 2014 °

## 2013-04-08 NOTE — Telephone Encounter (Signed)
RECEIVED A FAX FROM [employer] CONCERNING A CONFIRMATION OF PRESCRIPTION SHIPMENT FOR CAPECITABINE ON 04/05/13.

## 2013-04-10 ENCOUNTER — Telehealth (INDEPENDENT_AMBULATORY_CARE_PROVIDER_SITE_OTHER): Payer: Self-pay

## 2013-04-10 DIAGNOSIS — C20 Malignant neoplasm of rectum: Secondary | ICD-10-CM

## 2013-04-10 MED ORDER — LOPERAMIDE HCL 2 MG PO CAPS: ORAL_CAPSULE | ORAL | Status: DC

## 2013-04-10 NOTE — Telephone Encounter (Signed)
Pt wife calling to check up on refill request. She states she called pharmacy for a refill of Imodium and they faxed the request twice now. I checked with Elease Hashimoto and looked in Dr Michaell Cowing' folder and we do not see the requests. Will send message to Dr Michaell Cowing for the okay to refill Imodium and call patient back once we hear from him.

## 2013-04-10 NOTE — Addendum Note (Signed)
Addended by: Ethlyn Gallery on: 04/10/2013 02:16 PM   Modules accepted: Orders

## 2013-04-10 NOTE — Telephone Encounter (Signed)
Called pt to let him know that Dr Michaell Cowing ok'd refill on the Imodium 2mg  #60 x 5 add. Refills to the [facility]. The pt understands.

## 2013-04-10 NOTE — Telephone Encounter (Signed)
OK to refill imodium 2-4mg  PO q6H PRN diarrhea #60 refill = 5

## 2013-04-16 ENCOUNTER — Telehealth: Payer: Self-pay | Admitting: *Deleted

## 2013-04-16 NOTE — Telephone Encounter (Signed)
Wife called to request samples of Lovenox for husband. Confirmed with Dr. Truett Perna, that the Lovenox will be discontinued after his treatment on 04/24/13 and PICC is removed. Was able to get a box of #10 syringes of Lovenox 120 mg for patient.  Wife reports they will pick them up on Wednesday when they are in town seeing Dr. Michaell Cowing.

## 2013-04-17 ENCOUNTER — Telehealth: Payer: Self-pay | Admitting: *Deleted

## 2013-04-17 ENCOUNTER — Encounter (INDEPENDENT_AMBULATORY_CARE_PROVIDER_SITE_OTHER): Payer: Self-pay | Admitting: Surgery

## 2013-04-17 ENCOUNTER — Ambulatory Visit (INDEPENDENT_AMBULATORY_CARE_PROVIDER_SITE_OTHER): Payer: Medicare Other | Admitting: Surgery

## 2013-04-17 VITALS — BP 114/68 | HR 96 | Resp 16 | Ht 70.0 in | Wt 182.2 lb

## 2013-04-17 DIAGNOSIS — Z932 Ileostomy status: Secondary | ICD-10-CM

## 2013-04-17 DIAGNOSIS — C20 Malignant neoplasm of rectum: Secondary | ICD-10-CM

## 2013-04-17 DIAGNOSIS — K624 Stenosis of anus and rectum: Secondary | ICD-10-CM

## 2013-04-17 NOTE — Telephone Encounter (Signed)
04/17/13 1421 telephone message entered in error.  Please disregard.

## 2013-04-17 NOTE — Progress Notes (Signed)
Subjective:     Patient ID: Tyler Diaz, male   DOB: [birth year], 67 y.o.   MRN: [id number]  HPI   Tyler Diaz  1946-03-10 [id number]  Patient Care Team: Ernestina Penna, MD as PCP - General (Family Medicine) Ardeth Sportsman, MD as Consulting Physician (Colon and Rectal Surgery) Shirley Friar, MD as Consulting Physician (Gastroenterology) Lance Bosch, MD as Consulting Physician (Radiation Oncology) Cira Rue, RN as Registered Nurse (Oncology) Ladene Artist, MD as Consulting Physician (Oncology)  Procedure (Date: 12/27/2012):  POST-OPERATIVE DIAGNOSIS: very distal rectal cancer  Umbilical incarcerated hernia  LIH - incarcerated  RIH   PROCEDURE: Procedure(s):  LAPAROSCOPIC LOW ANTERIOR RESECTION,  COLO-ANAL ANASTOMOSIS,  DIVERTING LOOP ILEOSTOMY,  SPLENIC LEXURE MOBILIZATION,  PRIMARY INCARCERATED UMBILICAL HERNIA REPAIR   This patient returns for surgical re-evaluation.  He again comes with his son.  He started to feel better over the past 2 weeks.  Diarrhea controlled with Imodium, iron, and Lomotil.  He is due to get another course of IV oxaliplatin next week.  He thinks that his last dose.  Continues on Xeloda.  He can still get out and walk more.  No nausea or vomiting.  Tolerating solids.  No fevers or chills.    Continues on Lovenox for his DVT.  Plan is to pull PICC line out when completing oxaliplatin.  He thinks that his November 19.  Ileostomy output under control.  No leaking of bag.  Tolerating pouching better.    Patient Active Problem List   Diagnosis Date Noted  . Herpes zoster - R flank - with severe pain 03/11/2013  . Ileostomy - diverting loop - in place [id number] 01/14/2013  . Anemia in neoplastic disease 01/14/2013  . Umbilical hernia, incarcerated - s/p primary repair July 2014 01/03/2013  . Bilateral inguinal hernia (BIH) 01/03/2013  . Mitral regurgitation due to partially flail posterior mitral valve leaflet 07/30/2012  . Hyperlipidemia     . Rectal cancer - distal s/p lap LAR/coloanal July 2014 - [id number]. 07/27/2012  . Hypertension   . Vertigo     Past Medical History  Diagnosis Date  . Mitral regurgitation due to partially flail posterior mitral valve leaflet   . Rectal cancer   . Hyperlipidemia   . Colon cancer 07/18/12 bx    rectum=Invasive adenocarcinoma w/ extracellular mucin,polyp=benign  . Hypertension     essential (benign)  . Rheumatic fever as child    hx  . Hearing loss     partial greater on left  . Tinnitus     left ear  . Sinusitis     seasonal  . Rectal bleeding     last month hx  . Arthritis     hands/knees  . Hematochezia     recent  . Heart murmur   . Bronchitis   . Hypothyroidism as child    hx of    Past Surgical History  Procedure Laterality Date  . Carpal tunnel release Right 2002  . Tonsillectomy      as child  . Inguinal hernia repair  1982    left side  . Laparoscopic low anterior resection N/A 12/27/2012    Procedure: LAPAROSCOPIC LOW ANTERIOR RESECTION, COLO-ANAL ANASTOMOSIS, DIVERTING LOOP ILEOSTOMY,SPLENIC FLEXURE MOBILIZATION, PRIMARY INCARCERATED UMBILICAL HERNIA REPAIR;  Surgeon: Ardeth Sportsman, MD;  Location: [facility];  Service: General;  Laterality: N/A;  . Laparoscopic low anterior rescection with coloanal anastomosis N/A 12/27/2012    Procedure: LAPAROSCOPIC LOW ANTERIOR RESCECTION WITH  COLOANAL ANASTOMOSIS;  Surgeon: Ardeth Sportsman, MD;  Location: [facility];  Service: General;  Laterality: N/A;  . Ileostomy N/A 12/27/2012    Procedure: DIVERTING LOOP ILEOSTOMY;  Surgeon: Ardeth Sportsman, MD;  Location: [facility];  Service: General;  Laterality: N/A;  . Umbilical hernia repair  12/27/2012    Procedure: PRIMARY REPAIR INCARCERATED UMBILICAL HERNIA;  Surgeon: Ardeth Sportsman, MD;  Location: [facility];  Service: General;;    History   Social History  . Marital Status: Married    Spouse Name: N/A    Number of Children: 2  . Years of Education: N/A   Occupational History   . Retired     Development worker, [employer]   Social History Main Topics  . Smoking status: Former Smoker -- 1.50 packs/day for 20 years    Types: Cigarettes    Quit date: 07/27/1972  . Smokeless tobacco: Never Used  . Alcohol Use: No  . Drug Use: No     Comment: quit smoking 35 years ago  . Sexual Activity: Not on file   Other Topics Concern  . 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  Married    Family History  Problem Relation Age of Onset  . Cancer Mother     abdominal ca ?ovarian?  . Cancer Sister     liver cancer    Current Outpatient Prescriptions  Medication Sig Dispense Refill  . acetaminophen (TYLENOL) 500 MG tablet Take 500 mg by mouth every 6 (six) hours as needed for pain.      Marland Kitchen acetaminophen-codeine (TYLENOL #3) 300-30 MG per tablet Take 1-2 tablets by mouth every 6 (six) hours as needed for pain.  60 tablet  0  . ALPRAZolam (XANAX) 0.5 MG tablet TAKE ONE TABLET TWICE DAILY PRN  30 tablet  0  . capecitabine (XELODA) 500 MG tablet Take 2 tablets (1,000 mg total) by mouth 2 (two) times daily after a meal. for 14 days, then 7 days off.  56 tablet  0  . diphenoxylate-atropine (LOMOTIL) 2.5-0.025 MG per tablet TAKE ONE TABLET BY MOUTH 4 TIMES DAILY AS NEEDED FOR  DIARRHEA  OR  LOOSE  STOOLS  60 tablet  1  . enoxaparin (LOVENOX) 120 MG/0.8ML injection Inject 0.8 mLs (120 mg total) into the skin daily.  30 Syringe  2  . ferrous sulfate 325 (65 FE) MG tablet Take 1 tablet (325 mg total) by mouth 3 (three) times daily with meals.  60 tablet  2  . Heparin Lock Flush (HEPARIN FLUSH, PORCINE,) 100 UNIT/ML injection 2.5 mLs (250 Units total) by Intracatheter route every Monday, Wednesday, and Friday. Use 1/2 of 5 ml syringe and flush PICC line every MWF  12 Syringe  2  . hydrocortisone (PROCTOZONE-HC) 2.5 % rectal cream Place rectally 2 (two) times daily.  30 g  0  . LACTATED RINGERS IV Inject 1,000 mLs into the vein daily. At  home      . loperamide (IMODIUM) 2 MG capsule TAKE ONE TO TWO CAPSULES BY MOUTH EVERY 8 HOURS AS NEEDED FOR DIARRHEA OR LOOSE STOOLS (USE IF >2 BOWEL MOVEMENTS EVERY 8 HOURS)  60 capsule  5  . Multiple Vitamin (MULTIVITAMIN WITH MINERALS) TABS Take 1 tablet by mouth daily.      . ondansetron (ZOFRAN) 8 MG tablet Take 1 tablet (8 mg total) by mouth every 8 (eight) hours as needed for nausea.  30 tablet  2  . OXALIPLATIN IV Inject into the vein.      . pravastatin (PRAVACHOL) 20 MG tablet Take 20 mg by mouth every morning.       . promethazine (PHENERGAN) 25 MG tablet Take 25 mg by mouth every 6 (six) hours as needed for nausea.       . Sodium Chloride Flush (NORMAL SALINE FLUSH) 0.9 % SOLN Inject 10 mLs into the vein every Monday, Wednesday, and Friday. Inject into PICC line  12 Syringe  3   No current facility-administered medications for this visit.     Allergies  Allergen Reactions  . Shrimp [Shellfish Allergy] Other (See Comments)    Whelps and sick    BP 114/68  Pulse 96  Resp 16  Ht 5\' 10"  (1.778 m)  Wt 182 lb 3.2 oz (82.645 kg)  BMI 26.14 kg/m2  No results found.   Review of Systems  Constitutional: Negative for fever, chills, diaphoresis, activity change, appetite change, fatigue and unexpected weight change.  HENT: Negative for sore throat and trouble swallowing.   Eyes: Negative for photophobia and visual disturbance.  Respiratory: Negative for choking and shortness of breath.   Cardiovascular: Negative for chest pain, palpitations and leg swelling.  Gastrointestinal: Positive for diarrhea. Negative for nausea, vomiting, abdominal pain, constipation, blood in stool, abdominal distention, anal bleeding and rectal pain.  Genitourinary: Negative for dysuria, urgency, frequency, enuresis, difficulty urinating and testicular pain.  Musculoskeletal: Negative for arthralgias, back pain, gait problem, myalgias and neck pain.  Skin: Negative for color change, rash and wound.    Neurological: Negative for dizziness, speech difficulty, weakness and numbness.  Hematological: Negative for adenopathy.  Psychiatric/Behavioral: Negative for hallucinations, confusion and agitation.       Objective:   Physical Exam  Constitutional: He is oriented to person, place, and time. He appears well-developed and well-nourished. No distress.  HENT:  Head: Normocephalic.  Mouth/Throat: Oropharynx is clear and moist. No oropharyngeal exudate.  Eyes: Conjunctivae and EOM are normal. Pupils are equal, round, and reactive to light. No scleral icterus.  Neck: Normal range of motion. No tracheal deviation present.  Cardiovascular: Normal rate, normal heart sounds and intact distal pulses.   Pulmonary/Chest: Effort normal. No respiratory distress.  Abdominal: Soft. He exhibits no distension. There is no tenderness. There is no rigidity, no rebound, no guarding, no tenderness at McBurney's point and negative Murphy's sign. No hernia. Hernia confirmed negative in the right inguinal area and confirmed negative in the left inguinal area.    Incisions clean with normal healing ridges.  No hernias  Genitourinary:  Exam done with assistance of male Medical Assistant in the room.  Perianal skin clean with good hygiene.  No pruritis.  No pilonidal disease.  No fissure.  No abscess/fistula.    No external skin tags / hemorrhoids of significance.  Tolerates digital rectal exam.  Normal sphincter tone.  No rectal masses.    Stricture at coloanal anastomosis 2-3 cm from anal verge.  I dilated up w finger.  Encountered soft mucus balls just above the anastomosis.  Musculoskeletal: Normal range of motion. He exhibits no tenderness.  Neurological: He is alert and oriented to person, place, and time. No cranial nerve deficit or sensory deficit. He exhibits normal muscle tone. Coordination normal. GCS eye subscore is 4. GCS verbal subscore is 5. GCS motor subscore is 6.  Had mild tremor of left arm,  and then resolved.  Skin: Skin is warm and dry. No rash noted. He is  not diaphoretic.  Psychiatric: His behavior is normal. Judgment and thought content normal. His mood appears not anxious. His affect is not labile. His speech is not slurred. Cognition and memory are normal. He exhibits a depressed mood. He is communicative.       Assessment:     Gradually improving.     Plan:     Continue antidiarrheals while he is on the capecitabine oral chemotherapy.  He has not struggled like this at the beginning stages of his ileostomy, supple full is just a temporary annoyance.  Follow closely  Increased oral fluid intake.    Continue chemotherapy with capecitobene / oxalipatin as tolerated.  Soon to start fiith course of ?five.  I would wait 4-6 weeks after completion of chemotherapy for attempting ileostomy takedown.    The anatomy & physiology of the digestive tract was discussed.  The pathophysiology was discussed.  Possibility of remaining with an ostomy permanently was discussed.  I offered ostomy takedown.  Laparoscopic & open techniques were discussed.   Risks such as bleeding, infection, abscess, leak, reoperation, possible re-ostomy, injury to other organs, hernia, heart attack, death, and other risks were discussed.   I noted a good likelihood this will help address the problem.  Goals of post-operative recovery were discussed as well.  We will work to minimize complications.  Questions were answered.  The patient expresses understanding & wishes to proceed with surgery.  He could benefit from pelvic floor PT/training, but will hold off on that until recovers from more recent episode.   Please continue his Kegel pelvic floor exercises.

## 2013-04-17 NOTE — Telephone Encounter (Signed)
Spoke with patient by phone. Confirmed appointments with Dr. Sherrill and Dr. Moody. Contact names, phone numbers, and directions were provided to patient. Patient exptressed appreciation for the call and was without questions.       

## 2013-04-17 NOTE — Patient Instructions (Signed)
Complete chemotherapy.  Once that is done, anticipate taking down the ileostomy 4-6 weeks afterwards.  Ostomy Support Information  Yes, you've heard that people get along just fine with only one of their eyes, or one of their lungs, or one of their kidneys. But you also know that you have only one intestine and only one bladder, and that leaves you feeling awfully empty, both physically and emotionally: You think no other people go around without part of their intestine with the ends of their intestines sticking out through their abdominal walls.  Well, you are wrong! There are nearly three quarters of a million people in the [place] who have an ostomy; people who have had surgery to remove all or part of their colons or bladders. There is even a national association, the [place] [employer] [place] with over 350 local affiliated support groups that are organized by volunteers who provide peer support and counseling. Cheral Marker has a toll free telephone num-ber, 9382335480 and an educational,  interactive website, www.ostomy.org   An ostomy is an opening in the belly (abdominal wall) made by surgery. Ostomates are people who have had this procedure. The opening (stoma) allows the kidney or bowel to discharge waste. An external pouch covers the stoma to collect waste. Pouches are are a simple bag and are odor free. Different companies have disposable or reusable pouches to fit one's lifestyle. An ostomy can either be temporary or permanent.  THERE ARE THREE MAIN TYPES OF OSTOMIES  Colostomy. A colostomy is a surgically created opening in the large intestine (colon).  Ileostomy. An ileostomy is a surgically created opening in the small intestine.  Urostomy. A urostomy is a surgically created opening to divert urine away from the bladder. FREQUENTLY ASKED QUESTIONS   Why haven't you met any of these folks who have an ostomy?  Well, maybe you have! You just did not recognize them because an ostomy  doesn't show. It can be kept secret if you wish. Why, maybe some of your best friends, office associates or neighbors have an ostomy ... you never can tell.   People facing ostomy surgery have many quality-of-life questions like:  Will you bulge? Smell? Make noises? Will you feel waste leaving your body? Will you be a captive of the toilet? Will you starve? Be a social outcast? Get/stay married? Have babies? Easily bathe, go swimming, bend over?  OK, let's look at what you can expect:  Will you bulge?  Remember, without part of the intestine or bladder, and its contents, you should have a flatter tummy than before. You can expect to wear, with little exception, what you wore before surgery ... and this in-cludes tight clothing and bathing suits.  Will you smell?  Today, thanks to modern odor proof pouching systems, you can walk into an ostomy support group meeting and not smell anything that is foul or offensive. And, for those with an ileostomy or colostomy who are concerned about odor when emptying their pouch, there are in-pouch deodorants that can be used to eliminate any waste odors that may exist.  Will you make noises?  Everyone produces gas, especially if they are an air-swallower. But intestinal sounds that occur from time to time are no differ-ent than a gurgling tummy, and quite often your clothing will muffle any sounds.   Will you feel the waste discharges?  For those with a colostomy or ileostomy there might be a slight pressure when waste leaves your body, but understand that the intestines  have no nerve endings, so there will be no unpleasant sensations. Those with a urostomy will probably be unaware of any kidney drainage.  Will you be a captive of the toilet?  Immediately post-op you will spend more time in the bathroom than you will after your body recovers from surgery. Every person is different, but on average those with an ileostomy or urostomy may empty their pouches 4 to 6  times a day; a little  less if you have a colostomy. The average wear time between pouch system changes is 3 to 5 days and the changing process should take less than 30 minutes.  Will I need to be on a special diet? Most people return to their normal diet when they have recovered from surgery. Be sure to chew your food well, eat a well-balanced diet and drink plenty of fluids. If you experience problems with a certain food, wait a couple of weeks and try it again. Will there be odor and noises? Pouching systems are designed to be odor-proof or odor-resistant. There are deodorants that can be used in the pouch. Medications are also available to help reduce odor. Limit gas-producing foods and carbonated beverages. You will experience less gas and fewer noises as you heal from surgery. How much time will it take to care for my ostomy? At first, you may spend a lot of time learning about your ostomy and how to take care of it. As you become more comfortable and skilled at changing the pouching system, it will take very little time to care for it.  Will I be able to return to work? People with ostomies can perform most jobs. As soon as you have healed from surgery, you should be able to return to work. Heavy lifting (more than 10 pounds) may be discouraged.  What about intimacy? Sexual relationships and intimacy are important and fulfilling aspects of your life. They should continue after ostomy surgery. Intimacy-related concerns should be discussed openly between you and your partner.  Can I wear regular clothing? You do not need to wear special clothing. Ostomy pouches are fairly flat and barely noticeable. Elastic undergarments will not hurt the stoma or prevent the ostomy from functioning.  Can I participate in sports? An ostomy should not limit your involvement in sports. Many people with ostomies are runners, skiers, swimmers or participate in other active lifestyles. Talk with your caregiver first  before doing heavy physical activity.  Will you starve?  Not if you follow doctor's orders at each stage of your post-op adjustment. There is no such thing as an "ostomy diet". Some people with an ostomy will be able to eat and tolerate anything; others may find diffi-culty with some foods. Each person is an individual and must determine, by trial, what is best for them. A good practice for all is to drink plenty of water.  Will you be a social outcast?  Have you met anyone who has an ostomy and is a social outcast? Why should you be the first? Only your attitude and self image will effect how you are treated. No confi-dent person is an Investment banker, corporate.   PROFESSIONAL HELP  Resources are available if you need help or have questions about your ostomy.    Specially trained nurses called Wound, Ostomy Continence Nurses (WOCN) are available for consultation in most major medical centers.   Consider getting an ostomy consult with Page Spiro at [facility] to help troubleshoot collagen fittings and other issues with your ostomy:  [phone]   The United Ostomy Association (UOA) is a group made up of many local chapters throughout the [place]. These local groups hold meetings and provide support to prospective and existing ostomates. They sponsor educational events and have qualified visitors to make personal or telephone visits. Contact the UOA for the chapter nearest you and for other educational publications.  More detailed information can be found in Colostomy Guide, a publication of the [employer] (UOA). Contact UOA at 1-2013352314 or visit their web site at YellowSpecialist.at. The website contains links to other sites, suppliers and resources. Document Released: 05/26/2003 Document Revised: 08/15/2011 Document Reviewed: 09/24/2008 [facility] Patient Information 2013 [place], [place].  GETTING TO GOOD BOWEL HEALTH. Irregular bowel habits such as constipation and diarrhea  can lead to many problems over time.  Having one soft bowel movement a day is the most important way to prevent further problems.  The anorectal canal is designed to handle stretching and feces to safely manage our ability to get rid of solid waste (feces, poop, stool) out of our body.  BUT, hard constipated stools can act like ripping concrete bricks and diarrhea can be a burning fire to this very sensitive area of our body, causing inflamed hemorrhoids, anal fissures, increasing risk is perirectal abscesses, abdominal pain/bloating, an making irritable bowel worse.     The goal: ONE SOFT BOWEL MOVEMENT A DAY!  To have soft, regular bowel movements:    Drink at least 8 tall glasses of water a day.     Take plenty of fiber.  Fiber is the undigested part of plant food that passes into the colon, acting s "natures broom" to encourage bowel motility and movement.  Fiber can absorb and hold large amounts of water. This results in a larger, bulkier stool, which is soft and easier to pass. Work gradually over several weeks up to 6 servings a day of fiber (25g a day even more if needed) in the form of: o Vegetables -- Root (potatoes, carrots, turnips), leafy green (lettuce, salad greens, celery, spinach), or cooked high residue (cabbage, broccoli, etc) o Fruit -- Fresh (unpeeled skin & pulp), Dried (prunes, apricots, cherries, etc ),  or stewed ( applesauce)  o Whole grain breads, pasta, etc (whole wheat)  o Bran cereals    Bulking Agents -- This type of water-retaining fiber generally is easily obtained each day by one of the following:  o Psyllium bran -- The psyllium plant is remarkable because its ground seeds can retain so much water. This product is available as Metamucil, Konsyl, Effersyllium, Per Diem Fiber, or the less expensive generic preparation in drug and health food stores. Although labeled a laxative, it really is not a laxative.  o Methylcellulose -- This is another fiber derived from wood which  also retains water. It is available as Citrucel. o Polyethylene Glycol - and "artificial" fiber commonly called Miralax or Glycolax.  It is helpful for people with gassy or bloated feelings with regular fiber o Flax Seed - a less gassy fiber than psyllium   No reading or other relaxing activity while on the toilet. If bowel movements take longer than 5 minutes, you are too constipated   AVOID CONSTIPATION.  High fiber and water intake usually takes care of this.  Sometimes a laxative is needed to stimulate more frequent bowel movements, but    Laxatives are not a good long-term solution as it can wear the colon out. o Osmotics (Milk of Magnesia, Fleets phosphosoda, Magnesium citrate,  MiraLax, GoLytely) are safer than  o Stimulants (Senokot, Castor Oil, Dulcolax, Ex Lax)    o Do not take laxatives for more than 7days in a row.    IF SEVERELY CONSTIPATED, try a Bowel Retraining Program: o Do not use laxatives.  o Eat a diet high in roughage, such as bran cereals and leafy vegetables.  o Drink six (6) ounces of prune or apricot juice each morning.  o Eat two (2) large servings of stewed fruit each day.  o Take one (1) heaping tablespoon of a psyllium-based bulking agent twice a day. Use sugar-free sweetener when possible to avoid excessive calories.  o Eat a normal breakfast.  o Set aside 15 minutes after breakfast to sit on the toilet, but do not strain to have a bowel movement.  o If you do not have a bowel movement by the third day, use an enema and repeat the above steps.    Controlling diarrhea o Switch to liquids and simpler foods for a few days to avoid stressing your intestines further. o Avoid dairy products (especially milk & ice cream) for a short time.  The intestines often can lose the ability to digest lactose when stressed. o Avoid foods that cause gassiness or bloating.  Typical foods include beans and other legumes, cabbage, broccoli, and dairy foods.  Every person has some  sensitivity to other foods, so listen to our body and avoid those foods that trigger problems for you. o Adding fiber (Citrucel, Metamucil, psyllium, Miralax) gradually can help thicken stools by absorbing excess fluid and retrain the intestines to act more normally.  Slowly increase the dose over a few weeks.  Too much fiber too soon can backfire and cause cramping & bloating. o Probiotics (such as active yogurt, Align, etc) may help repopulate the intestines and colon with normal bacteria and calm down a sensitive digestive tract.  Most studies show it to be of mild help, though, and such products can be costly. o Medicines:   Bismuth subsalicylate (ex. Kayopectate, Pepto Bismol) every 30 minutes for up to 6 doses can help control diarrhea.  Avoid if pregnant.   Loperamide (Immodium) can slow down diarrhea.  Start with two tablets (4mg  total) first and then try one tablet every 6 hours.  Avoid if you are having fevers or severe pain.  If you are not better or start feeling worse, stop all medicines and call your doctor for advice o Call your doctor if you are getting worse or not better.  Sometimes further testing (cultures, endoscopy, X-ray studies, bloodwork, etc) may be needed to help diagnose and treat the cause of the diarrhea.  Pelvic floor muscle training exercises  can help strengthen the muscles under the uterus, bladder, and bowel (large intestine). They can help both men and women who have problems with urine leakage or bowel control.  A pelvic floor muscle training exercise is like pretending that you have to urinate, and then holding it. You relax and tighten the muscles that control urine flow. It's important to find the right muscles to tighten.  The next time you have to urinate, start to go and then stop. Feel the muscles in your vagina, bladder, or anus get tight and move up. These are the pelvic floor muscles. If you feel them tighten, you've done the exercise right. If you are  still not sure whether you are tightening the right muscles, keep in mind that all of the muscles of the pelvic floor relax and  contract at the same time. Because these muscles control the bladder, rectum, and vagina, the following tips may help: Women: Insert a finger into your vagina. Tighten the muscles as if you are holding in your urine, then let go. You should feel the muscles tighten and move up and down.  Men: Insert a finger into your rectum. Tighten the muscles as if you are holding in your urine, then let go. You should feel the muscles tighten and move up and down. These are the same muscles you would tighten if you were trying to prevent yourself from passing gas.  It is very important that you keep the following muscles relaxed while doing pelvic floor muscle training exercises: Abdominal  Buttocks (the deeper, anal sphincter muscle should contract)  Thigh   A woman can also strengthen these muscles by using a vaginal cone, which is a weighted device that is inserted into the vagina. Then you try to tighten the pelvic floor muscles to hold the device in place. If you are unsure whether you are doing the pelvic floor muscle training correctly, you can use biofeedback and electrical stimulation to help find the correct muscle group to work. Biofeedback is a method of positive reinforcement. Electrodes are placed on the abdomen and along the anal area. Some therapists place a sensor in the vagina in women or anus in men to monitor the contraction of pelvic floor muscles.  A monitor will display a graph showing which muscles are contracting and which are at rest. The therapist can help find the right muscles for performing pelvic floor muscle training exercises.   PERFORMING PELVIC FLOOR EXERCISES: 1. Begin by emptying your bladder. 2. Tighten the pelvic floor muscles and hold for a count of 10. 3. Relax the muscles completely for a count of 10. 4. Do 10 repititions, 3 to 5 times a day  (morning, afternoon, and night). You can do these exercises at any time and any place. Most people prefer to do the exercises while lying down or sitting in a chair. After 4 - 6 weeks, most people notice some improvement. It may take as long as 3 months to see a major change. After a couple of weeks, you can also try doing a single pelvic floor contraction at times when you are likely to leak (for example, while getting out of a chair). A word of caution: Some people feel that they can speed up the progress by increasing the number of repetitions and the frequency of exercises. However, over-exercising can instead cause muscle fatigue and increase urine leakage. If you feel any discomfort in your abdomen or back while doing these exercises, you are probably doing them wrong. Breathe deeply and relax your body when you are doing these exercises. Make sure you are not tightening your stomach, thigh, buttock, or chest muscles. When done the right way, pelvic floor muscle exercises have been shown to be very effective at improving urinary continence. Alternative Names Kegel exercises

## 2013-04-18 ENCOUNTER — Other Ambulatory Visit: Payer: Self-pay | Admitting: *Deleted

## 2013-04-18 DIAGNOSIS — C20 Malignant neoplasm of rectum: Secondary | ICD-10-CM

## 2013-04-18 NOTE — Telephone Encounter (Signed)
THIS REFILL REQUEST FOR CAPECITABINE WAS GIVEN TO DR.SHERRILL'S NURSE, SUSAN COWARD,RN. 

## 2013-04-19 MED ORDER — CAPECITABINE 500 MG PO TABS: 1000.0000 mg | ORAL_TABLET | Freq: Two times a day (BID) | ORAL | Status: DC

## 2013-04-19 NOTE — Addendum Note (Signed)
Addended by: Arvilla Meres on: 04/19/2013 01:03 PM   Modules accepted: Orders

## 2013-04-19 NOTE — Telephone Encounter (Signed)
RECEIVED A FAX FROM [facility] CONCERNING A CONFIRMATION OF FACSIMILE RECEIPT FOR PT. REFERRAL. 

## 2013-04-21 ENCOUNTER — Other Ambulatory Visit: Payer: Self-pay | Admitting: Oncology

## 2013-04-24 ENCOUNTER — Telehealth: Payer: Self-pay | Admitting: Oncology

## 2013-04-24 ENCOUNTER — Ambulatory Visit (HOSPITAL_BASED_OUTPATIENT_CLINIC_OR_DEPARTMENT_OTHER): Payer: Medicare Other | Admitting: Nurse Practitioner

## 2013-04-24 ENCOUNTER — Other Ambulatory Visit (HOSPITAL_BASED_OUTPATIENT_CLINIC_OR_DEPARTMENT_OTHER): Payer: Medicare Other | Admitting: Lab

## 2013-04-24 ENCOUNTER — Ambulatory Visit (HOSPITAL_BASED_OUTPATIENT_CLINIC_OR_DEPARTMENT_OTHER): Payer: Medicare Other

## 2013-04-24 VITALS — BP 136/78 | HR 97 | Temp 96.8°F | Resp 18 | Ht 70.0 in | Wt 184.7 lb

## 2013-04-24 DIAGNOSIS — L270 Generalized skin eruption due to drugs and medicaments taken internally: Secondary | ICD-10-CM

## 2013-04-24 DIAGNOSIS — R918 Other nonspecific abnormal finding of lung field: Secondary | ICD-10-CM

## 2013-04-24 DIAGNOSIS — C20 Malignant neoplasm of rectum: Secondary | ICD-10-CM

## 2013-04-24 DIAGNOSIS — I82629 Acute embolism and thrombosis of deep veins of unspecified upper extremity: Secondary | ICD-10-CM

## 2013-04-24 DIAGNOSIS — Z5111 Encounter for antineoplastic chemotherapy: Secondary | ICD-10-CM

## 2013-04-24 LAB — CBC WITH DIFFERENTIAL/PLATELET
BASO%: 1.3 % (ref 0.0–2.0)
Basophils Absolute: 0.1 10*3/uL (ref 0.0–0.1)
EOS%: 12.6 % — ABNORMAL HIGH (ref 0.0–7.0)
HCT: 31 % — ABNORMAL LOW (ref 38.4–49.9)
LYMPH%: 13.3 % — ABNORMAL LOW (ref 14.0–49.0)
MCH: 37.1 pg — ABNORMAL HIGH (ref 27.2–33.4)
MCHC: 33.2 g/dL (ref 32.0–36.0)
NEUT#: 3.3 10*3/uL (ref 1.5–6.5)
NEUT%: 53.5 % (ref 39.0–75.0)
Platelets: 206 10*3/uL (ref 140–400)
RBC: 2.77 10*6/uL — ABNORMAL LOW (ref 4.20–5.82)

## 2013-04-24 LAB — COMPREHENSIVE METABOLIC PANEL (CC13)
AST: 33 U/L (ref 5–34)
Alkaline Phosphatase: 98 U/L (ref 40–150)
BUN: 11.3 mg/dL (ref 7.0–26.0)
Calcium: 9.6 mg/dL (ref 8.4–10.4)
Chloride: 106 mEq/L (ref 98–109)
Creatinine: 0.8 mg/dL (ref 0.7–1.3)
Glucose: 102 mg/dl (ref 70–140)
Potassium: 4 mEq/L (ref 3.5–5.1)

## 2013-04-24 MED ORDER — ACETAMINOPHEN-CODEINE #3 300-30 MG PO TABS: 1.0000 | ORAL_TABLET | Freq: Four times a day (QID) | ORAL | Status: DC | PRN

## 2013-04-24 MED ORDER — PALONOSETRON HCL INJECTION 0.25 MG/5ML
0.2500 mg | Freq: Once | INTRAVENOUS | Status: AC
  Administered 2013-04-24: 0.25 mg via INTRAVENOUS

## 2013-04-24 MED ORDER — PALONOSETRON HCL INJECTION 0.25 MG/5ML
INTRAVENOUS | Status: AC
  Filled 2013-04-24: qty 5

## 2013-04-24 MED ORDER — SODIUM CHLORIDE 0.9 % IV SOLN
INTRAVENOUS | Status: DC
  Administered 2013-04-24: 13:00:00 via INTRAVENOUS

## 2013-04-24 MED ORDER — SODIUM CHLORIDE 0.9 % IJ SOLN
10.0000 mL | INTRAMUSCULAR | Status: DC | PRN
  Administered 2013-04-24: 10 mL
  Filled 2013-04-24: qty 10

## 2013-04-24 MED ORDER — OXALIPLATIN CHEMO INJECTION 100 MG/20ML
80.0000 mg/m2 | Freq: Once | INTRAVENOUS | Status: AC
  Administered 2013-04-24: 160 mg via INTRAVENOUS
  Filled 2013-04-24: qty 32

## 2013-04-24 MED ORDER — DEXTROSE 5 % IV SOLN
Freq: Once | INTRAVENOUS | Status: AC
  Administered 2013-04-24: 13:00:00 via INTRAVENOUS

## 2013-04-24 MED ORDER — HEPARIN SOD (PORK) LOCK FLUSH 100 UNIT/ML IV SOLN
250.0000 [IU] | Freq: Once | INTRAVENOUS | Status: AC | PRN
  Administered 2013-04-24: 250 [IU]
  Filled 2013-04-24: qty 5

## 2013-04-24 MED ORDER — DEXAMETHASONE SODIUM PHOSPHATE 10 MG/ML IJ SOLN
10.0000 mg | Freq: Once | INTRAMUSCULAR | Status: AC
  Administered 2013-04-24: 10 mg via INTRAVENOUS

## 2013-04-24 MED ORDER — DEXAMETHASONE SODIUM PHOSPHATE 10 MG/ML IJ SOLN
INTRAMUSCULAR | Status: AC
  Filled 2013-04-24: qty 1

## 2013-04-24 MED ORDER — HEPARIN SOD (PORK) LOCK FLUSH 100 UNIT/ML IV SOLN
500.0000 [IU] | Freq: Once | INTRAVENOUS | Status: DC | PRN
  Filled 2013-04-24: qty 5

## 2013-04-24 NOTE — Progress Notes (Signed)
OFFICE PROGRESS NOTE  Interval history:  Tyler Diaz returns for followup of rectal cancer. He completed cycle 4 CAPOX beginning 04/03/2013. Xeloda was dose reduced beginning with cycle 4 due to hand foot syndrome.  He denies nausea/vomiting. Diarrhea is controlled with Lomotil/Imodium. He continues IV fluids 3 times a week. He denies mouth sores. He notes tingling in the hands. No difficulty with activities. He notes mild redness over the palms. No associated pain or skin breakdown. Feet are "better".   Objective: Blood pressure 136/78, pulse 97, temperature 96.8 F (36 C), temperature source Oral, resp. rate 18, height 5\' 10"  (1.778 m), weight 184 lb 11.2 oz (83.779 kg).  Oropharynx is without thrush or ulceration. Lungs are clear. Regular cardiac rhythm. Abdomen is soft. No hepatomegaly. Ileostomy. No leg edema. Moderate decrease in vibratory sense over the fingertips per tuning fork exam. Mild erythema over the palms and soles with no skin breakdown. PICC site is without erythema.  Lab Results: Lab Results  Component Value Date   WBC 6.3 04/24/2013   HGB 10.3* 04/24/2013   HCT 31.0* 04/24/2013   MCV 112.0* 04/24/2013   PLT 206 04/24/2013    Chemistry:    Chemistry      Component Value Date/Time   NA 140 04/03/2013 0935   NA 135 01/14/2013 1155   K 4.0 04/03/2013 0935   K 4.8 01/14/2013 1155   CL 99 01/14/2013 1155   CO2 23 04/03/2013 0935   CO2 26 01/14/2013 1155   BUN 8.7 04/03/2013 0935   BUN 21 01/14/2013 1155   CREATININE 0.8 04/03/2013 0935   CREATININE 1.13 01/14/2013 1155   CREATININE 1.13 01/03/2013 0343      Component Value Date/Time   CALCIUM 9.4 04/03/2013 0935   CALCIUM 9.4 01/14/2013 1155   ALKPHOS 89 04/03/2013 0935   ALKPHOS 44 12/31/2012 0820   AST 38* 04/03/2013 0935   AST 15 12/31/2012 0820   ALT 35 04/03/2013 0935   ALT 11 12/31/2012 0820   BILITOT 0.70 04/03/2013 0935   BILITOT 0.5 12/31/2012 0820       Studies/Results: No results  found.  Medications: I have reviewed the patient's current medications.  Assessment/Plan:  1. Stage III rectal cancer, presenting with a locally advanced rectal tumor.  CT/MRI evidence of perirectal lymphadenopathy.  Status post neoadjuvant Xeloda and radiation.  Status post low anterior resection and coloanal anastomosis 12/27/2012 with pathology confirming a ypT3, ypN1b tumor, microsatellite stable.  Initiation of adjuvant CAPOX chemotherapy 01/30/2013.  Cycle 2 adjuvant CAPOX 02/20/2013.  Cycle 3 adjuvant CAPOX 03/13/2013. Cycle 4 adjuvant CAPOX 04/03/2013 (oxaliplatin dose reduced with cycle 4 due to neuropathy, Xeloda dose reduced with cycle 4 due to hand-foot syndrome). 2. Small bilateral lung nodules and a mildly enlarged gastrohepatic node on the initial staging CT scans 08/02/2012.  CT chest on 01/23/2013 with stable bilateral pulmonary nodules. 3. Elevated pretreatment CEA.  Repeat CEA 1.1 on 01/21/2013. 4. Mitral regurgitation. 5. Hypertension. 6. History of rheumatic fever. 7. Pain and swelling at the right arm on 01/18/2013. Doppler confirmed a right upper extremity deep vein thrombosis.  Daily Lovenox started on 01/18/2013. 8. Acute herpes zoster affecting right thoracic dermatomes September 2014. He completed a course of valacyclovir. 9. Pain related to herpes zoster. Improved. 10. Delayed nausea following cycle 1 CAPOX. Aloxi added to the premedication regimen beginning with cycle 2. 11. Diarrhea following cycle 2 CAPOX. 12. Hand/foot syndrome secondary to Xeloda-Xeloda dose reduced with cycle 4 CAPOX.  Disposition-Tyler Diaz appears stable. He  has completed 4 cycles of adjuvant CAPOX. Plan to proceed with the fifth and final cycle today as scheduled.   He continues to require IV fluids 3 days per week. The PICC line will be left in place until he undergoes ileostomy reversal. He understands he needs to continue Lovenox until the PICC line is removed. He is having  difficulty obtaining Lovenox due to the cost. We provided him with some samples today.  We will let Dr. Michaell Cowing know that he is beginning the final cycle of chemotherapy today.  He will return for a followup visit in 3-4 weeks. He will contact the office in the interim with any problems.  Plan reviewed with Dr. Truett Perna.  Lonna Cobb ANP/GNP-BC

## 2013-04-24 NOTE — Telephone Encounter (Signed)
Gave pt apppt for lab and MD for December 2014, pt will see Dr. Michaell Cowing 11/15th

## 2013-04-24 NOTE — Patient Instructions (Signed)
[facility] Discharge Instructions for Patients Receiving Chemotherapy  Today you received the following chemotherapy agents: Oxaliplatin  To help prevent nausea and vomiting after your treatment, we encourage you to take your nausea medication as prescribed.    If you develop nausea and vomiting that is not controlled by your nausea medication, call the clinic.   BELOW ARE SYMPTOMS THAT SHOULD BE REPORTED IMMEDIATELY:  *FEVER GREATER THAN 100.5 F  *CHILLS WITH OR WITHOUT FEVER  NAUSEA AND VOMITING THAT IS NOT CONTROLLED WITH YOUR NAUSEA MEDICATION  *UNUSUAL SHORTNESS OF BREATH  *UNUSUAL BRUISING OR BLEEDING  TENDERNESS IN MOUTH AND THROAT WITH OR WITHOUT PRESENCE OF ULCERS  *URINARY PROBLEMS  *BOWEL PROBLEMS  UNUSUAL RASH Items with * indicate a potential emergency and should be followed up as soon as possible.  Feel free to call the clinic you have any questions or concerns. The clinic phone number is [phone].    

## 2013-04-29 ENCOUNTER — Encounter (HOSPITAL_COMMUNITY): Payer: Self-pay | Admitting: Pharmacy Technician

## 2013-05-01 ENCOUNTER — Telehealth: Payer: Self-pay | Admitting: *Deleted

## 2013-05-01 NOTE — Telephone Encounter (Signed)
Need to renew IVF orders of lactated ringers 1 liter IV 3 days/week-current orders expire 05/02/13. Gave verbal order to continue till 05/24/13.

## 2013-05-07 ENCOUNTER — Telehealth: Payer: Self-pay | Admitting: *Deleted

## 2013-05-07 NOTE — Telephone Encounter (Signed)
Call from pt requesting Lovenox samples. Has # 5 syringes left. Pharmacy will provide #20 day supply of Lovenox. Pt will come to office to pick up Lovenox.

## 2013-05-09 ENCOUNTER — Other Ambulatory Visit: Payer: Self-pay | Admitting: Nurse Practitioner

## 2013-05-09 ENCOUNTER — Inpatient Hospital Stay (HOSPITAL_COMMUNITY): Admission: RE | Admit: 2013-05-09 | Payer: Medicare Other | Source: Ambulatory Visit

## 2013-05-09 ENCOUNTER — Other Ambulatory Visit: Payer: Self-pay | Admitting: Oncology

## 2013-05-09 ENCOUNTER — Telehealth: Payer: Self-pay | Admitting: *Deleted

## 2013-05-09 DIAGNOSIS — C20 Malignant neoplasm of rectum: Secondary | ICD-10-CM

## 2013-05-09 MED ORDER — ACETAMINOPHEN-CODEINE #3 300-30 MG PO TABS: 1.0000 | ORAL_TABLET | Freq: Four times a day (QID) | ORAL | Status: DC | PRN

## 2013-05-09 NOTE — Telephone Encounter (Signed)
Called patient to notify him of new regulations for schedule II medications.  Tylenol with codeine and the xanax scripts are ready for pick up.  He will send his son Zyen Triggs. To pick these up next Tuesday.  Instructed with his next visit to sign a H.I.P.A.A. release form.

## 2013-05-10 ENCOUNTER — Other Ambulatory Visit: Payer: Self-pay | Admitting: *Deleted

## 2013-05-10 ENCOUNTER — Inpatient Hospital Stay: Admit: 2013-05-10 | Payer: Self-pay | Admitting: Surgery

## 2013-05-10 ENCOUNTER — Other Ambulatory Visit: Payer: Self-pay | Admitting: Oncology

## 2013-05-10 DIAGNOSIS — C20 Malignant neoplasm of rectum: Secondary | ICD-10-CM

## 2013-05-10 SURGERY — CLOSURE, ILEOSTOMY
Anesthesia: General

## 2013-05-10 NOTE — Telephone Encounter (Signed)
Faxed note to [facility]: Pt completed Xeloda course.

## 2013-05-10 NOTE — Telephone Encounter (Signed)
THIS REFILL REQUEST FOR CAPECITABINE WAS GIVEN TO DR.SHERRILL'S NURSE, TANYA WHITLOCK,RN. 

## 2013-05-11 ENCOUNTER — Other Ambulatory Visit: Payer: Self-pay | Admitting: Oncology

## 2013-05-19 ENCOUNTER — Other Ambulatory Visit: Payer: Self-pay | Admitting: Nurse Practitioner

## 2013-05-20 ENCOUNTER — Encounter (INDEPENDENT_AMBULATORY_CARE_PROVIDER_SITE_OTHER): Payer: Medicare Other | Admitting: Surgery

## 2013-05-21 ENCOUNTER — Telehealth: Payer: Self-pay | Admitting: Oncology

## 2013-05-21 ENCOUNTER — Ambulatory Visit (HOSPITAL_BASED_OUTPATIENT_CLINIC_OR_DEPARTMENT_OTHER): Payer: Medicare Other

## 2013-05-21 ENCOUNTER — Ambulatory Visit (HOSPITAL_BASED_OUTPATIENT_CLINIC_OR_DEPARTMENT_OTHER): Payer: Medicare Other | Admitting: Oncology

## 2013-05-21 ENCOUNTER — Other Ambulatory Visit (HOSPITAL_BASED_OUTPATIENT_CLINIC_OR_DEPARTMENT_OTHER): Payer: Medicare Other

## 2013-05-21 VITALS — BP 125/76 | HR 101 | Temp 97.8°F | Resp 18 | Ht 70.0 in | Wt 184.0 lb

## 2013-05-21 DIAGNOSIS — C20 Malignant neoplasm of rectum: Secondary | ICD-10-CM

## 2013-05-21 DIAGNOSIS — I82409 Acute embolism and thrombosis of unspecified deep veins of unspecified lower extremity: Secondary | ICD-10-CM

## 2013-05-21 DIAGNOSIS — Z23 Encounter for immunization: Secondary | ICD-10-CM

## 2013-05-21 LAB — COMPREHENSIVE METABOLIC PANEL (CC13)
ALT: 47 U/L (ref 0–55)
AST: 51 U/L — ABNORMAL HIGH (ref 5–34)
Anion Gap: 10 mEq/L (ref 3–11)
CO2: 28 mEq/L (ref 22–29)
Calcium: 9.8 mg/dL (ref 8.4–10.4)
Chloride: 105 mEq/L (ref 98–109)
Creatinine: 0.9 mg/dL (ref 0.7–1.3)
Sodium: 142 mEq/L (ref 136–145)
Total Protein: 6.7 g/dL (ref 6.4–8.3)

## 2013-05-21 LAB — CBC WITH DIFFERENTIAL/PLATELET
EOS%: 17.3 % — ABNORMAL HIGH (ref 0.0–7.0)
HCT: 32.5 % — ABNORMAL LOW (ref 38.4–49.9)
MCH: 39 pg — ABNORMAL HIGH (ref 27.2–33.4)
MCHC: 33.6 g/dL (ref 32.0–36.0)
MONO#: 0.8 10*3/uL (ref 0.1–0.9)
NEUT#: 2.9 10*3/uL (ref 1.5–6.5)
NEUT%: 53.8 % (ref 39.0–75.0)
Platelets: 184 10*3/uL (ref 140–400)
RBC: 2.8 10*6/uL — ABNORMAL LOW (ref 4.20–5.82)
RDW: 20 % — ABNORMAL HIGH (ref 11.0–14.6)
WBC: 5.4 10*3/uL (ref 4.0–10.3)
lymph#: 0.7 10*3/uL — ABNORMAL LOW (ref 0.9–3.3)

## 2013-05-21 MED ORDER — ACETAMINOPHEN-CODEINE #3 300-30 MG PO TABS: 1.0000 | ORAL_TABLET | Freq: Four times a day (QID) | ORAL | Status: DC | PRN

## 2013-05-21 MED ORDER — DIAZEPAM 2 MG PO TABS: 2.0000 mg | ORAL_TABLET | Freq: Every evening | ORAL | Status: DC | PRN

## 2013-05-21 MED ORDER — INFLUENZA VAC SPLIT QUAD 0.5 ML IM SUSP
0.5000 mL | Freq: Once | INTRAMUSCULAR | Status: AC
  Administered 2013-05-21: 0.5 mL via INTRAMUSCULAR
  Filled 2013-05-21: qty 0.5

## 2013-05-21 MED ORDER — PNEUMOCOCCAL VAC POLYVALENT 25 MCG/0.5ML IJ INJ
0.5000 mL | INJECTION | Freq: Once | INTRAMUSCULAR | Status: AC
  Administered 2013-05-21: 0.5 mL via INTRAMUSCULAR
  Filled 2013-05-21: qty 0.5

## 2013-05-21 NOTE — Patient Instructions (Signed)
Peripherally Inserted Central Catheter (PICC) Removal and Care After A peripherally inserted catheter (PICC) is removed when it is no longer needed, when it is clotted, or when it may be infected.  PROCEDURE  The removal of a PICC is usually painless. Removing the tape that holds the PICC in place may be the most discomfort you have.  A physicians order needs to be obtained to have the PICC removed.  A PICC can be removed in the hospital or in an outpatient setting.  Never remove or take out the PICC yourself. Only a trained clinical professional, such as a PICC nurse, should remove the PICC.  If a PICC is suspected to be infected, the PICC tip is sent to the lab for culture. HOME CARE INSTRUCTIONS  When the PICC is out, pressure is applied at the insertion site to prevent bleeding. An antibiotic ointment may be applied to the insertion site. A dry, sterile gauze is then taped over the insertion site. This dressing should stay on for 24 hours.  After the 24 hours is up, the dressing may be removed. The PICC insertion site is very small. A small scab may develop over the insertion site. It is okay to wash the site gently with soap and water. Be careful to not remove or pick the scab off. After washing, gently pat the site dry. You do not need to put another dressing over the insertion site after you wash it.  Avoid heavy, strenuous physical activity for 24 hours after the PICC is removed. This includes things like:  Weight lifting.  Strenuous yard work.  Any physical activity with repetitive arm movement. SEEK MEDICAL CARE IF:  Call or see your caregiver as soon as possible if you develop the following conditions in the arm in which the PICC was inserted:  Swelling or puffiness.  Increasing tenderness or pain. SEEK IMMEDIATE MEDICAL CARE IF:  You develop any of the following conditions in the arm that had the PICC:  Numbness or tingling in your fingers, hand, or arm.  You arm has  a bluish color and it is cold to the touch.  Redness around the insertion site or a red-streak that goes up your arm.  Any type of drainage from the PICC insertion site. This includes drainage such as:  Bleeding from the insertion site. (If this happens, apply firm, direct pressure to the PICC insertion site with a clean towel.)  Drainage that is yellow or tan in color.  You have an oral temperature above 102 F (38.9 C), not controlled by medicine. Document Released: 11/10/2009 Document Revised: 08/15/2011 Document Reviewed: 11/10/2009 [employer] Patient Information 2014 [employer].  

## 2013-05-21 NOTE — Progress Notes (Signed)
1340- Patient placed in supine position for PICC removal. PICC removed, uneventful. Pressure held for 5 mins. PICC measuring at 43cm and confirmed under doc flowsheets, tip clean and cut. Patient to remain in supine position x 30 mins, voices understanding.

## 2013-05-21 NOTE — Progress Notes (Signed)
   [facility]    OFFICE PROGRESS NOTE    INTERVAL HISTORY:   Tyler Diaz returns for scheduled followup of rectal cancer. He completed a final cycle of CAPOX beginning on 04/24/2013. He reports a tingling sensation at the fingers. The ostomy output has improved with Imodium, Lomotil, and iron. He is drinking today a radiates, 2 bottles of water, a soda, and 2 cans of ensure daily. He continues Lovenox anticoagulation. No swelling at the right arm. He noted "peeling" at the feet.  Objective:  Vital signs in last 24 hours:  Blood pressure 125/76, pulse 101, temperature 97.8 F (36.6 C), temperature source Oral, resp. rate 18, height 5\' 10"  (1.778 m), weight 184 lb (83.462 kg).    HEENT: No thrush or ulcers Lymphatics: No cervical, supraclavicular, axillary, or inguinal nodes Resp: Lungs clear bilaterally Cardio: Regular rate and rhythm, 2/6 systolic murmur GI: No hepatomegaly, right abdomen ileostomy Vascular: No leg edema. No arm edema. Very mild venous engorgement the right upper anterior chest. Neuro: Moderate decrease in vibratory sense at the fingertips bilaterally  Skin: No erythema at the hands or feet.   Portacath/PICC-without erythema  Lab Results:  Lab Results  Component Value Date   WBC 5.4 05/21/2013   HGB 10.9* 05/21/2013   HCT 32.5* 05/21/2013   MCV 116.0* 05/21/2013   PLT 184 05/21/2013   ANC 2.9  Potassium 3.9, BUN 11.1, creatinine 0.9  Medications: I have reviewed the patient's current medications.  Assessment/Plan: 1. Stage III rectal cancer, presenting with a locally advanced rectal tumor.  CT/MRI evidence of perirectal lymphadenopathy.  Status post neoadjuvant Xeloda and radiation.  Status post low anterior resection and coloanal anastomosis 12/27/2012 with pathology confirming a ypT3, ypN1b tumor, microsatellite stable.  Initiation of adjuvant CAPOX chemotherapy 01/30/2013.  Cycle 2 adjuvant CAPOX 02/20/2013.  Cycle 3 adjuvant CAPOX  03/13/2013.  Cycle 4 adjuvant CAPOX 04/03/2013 (oxaliplatin dose reduced with cycle 4 due to neuropathy, Xeloda dose reduced with cycle 4 due to hand-foot syndrome). Cycle 5 adjuvant CAPOX 04/24/2013 2. Small bilateral lung nodules and a mildly enlarged gastrohepatic node on the initial staging CT scans 08/02/2012.  CT chest on 01/23/2013 with stable bilateral pulmonary nodules. 3. Elevated pretreatment CEA.  Repeat CEA 1.1 on 01/21/2013. 4. Mitral regurgitation. 5. Hypertension. 6. History of rheumatic fever. 7. Pain and swelling at the right arm on 01/18/2013. Doppler confirmed a right upper extremity deep vein thrombosis.  Daily Lovenox started on 01/18/2013. PICC and Lovenox discontinued after an office visit pulse 60,014 8. Acute herpes zoster affecting right thoracic dermatomes September 2014. He completed a course of valacyclovir. 9. Pain related to herpes zoster. Improved. 10. Delayed nausea following cycle 1 CAPOX. Aloxi added to the premedication regimen beginning with cycle 2. 11. Diarrhea following cycle 2 CAPOX. 12. Hand/foot syndrome secondary to Xeloda-Xeloda dose reduced with cycle 4 CAPOX. Improved.    Disposition:  [name] has completed adjuvant chemotherapy. He is scheduled for an ileostomy takedown procedure in January.  He appears to be well hydrated. The ostomy output has improved with a current antidiarrheal regimen. We decided to discontinue the PICC and IV fluids. He has been maintained on Lovenox since August. He will take Lovenox today and tomorrow and then stop.  He will return for an office visit in 2 months. He will contact us for symptoms of dehydration.  He received an influenza vaccine and pneumococcal vaccine today.   Tyler Papas, MD  05/21/2013  12:29 PM

## 2013-05-21 NOTE — Telephone Encounter (Signed)
Gave pt appt for lab and ML for february 2015

## 2013-05-22 LAB — CEA: CEA: 1.2 ng/mL (ref 0.0–5.0)

## 2013-06-19 ENCOUNTER — Encounter (HOSPITAL_COMMUNITY): Payer: Self-pay | Admitting: Pharmacy Technician

## 2013-06-19 ENCOUNTER — Other Ambulatory Visit: Payer: Self-pay | Admitting: Oncology

## 2013-06-19 ENCOUNTER — Telehealth: Payer: Self-pay | Admitting: *Deleted

## 2013-06-19 NOTE — Telephone Encounter (Signed)
Called and left voicemail that patient orders/recertification were wrongly sent to Dr Jana Hakim as attending. Called and gave information on Dr Benay Spice and fax number and asked for MD to be corrected and re-faxed.

## 2013-06-24 ENCOUNTER — Encounter (INDEPENDENT_AMBULATORY_CARE_PROVIDER_SITE_OTHER): Payer: Self-pay

## 2013-06-24 ENCOUNTER — Other Ambulatory Visit: Payer: Self-pay | Admitting: *Deleted

## 2013-06-24 ENCOUNTER — Encounter (HOSPITAL_COMMUNITY): Payer: Self-pay

## 2013-06-24 ENCOUNTER — Encounter (HOSPITAL_COMMUNITY)
Admission: RE | Admit: 2013-06-24 | Discharge: 2013-06-24 | Disposition: A | Discharge status: Home / Self Care | Payer: Medicare Other | Source: Ambulatory Visit | Attending: Surgery | Admitting: Surgery

## 2013-06-24 DIAGNOSIS — Z01812 Encounter for preprocedural laboratory examination: Secondary | ICD-10-CM | POA: Insufficient documentation

## 2013-06-24 DIAGNOSIS — Z01818 Encounter for other preprocedural examination: Secondary | ICD-10-CM | POA: Insufficient documentation

## 2013-06-24 DIAGNOSIS — C20 Malignant neoplasm of rectum: Secondary | ICD-10-CM

## 2013-06-24 HISTORY — DX: Reserved for concepts with insufficient information to code with codable children: IMO0002

## 2013-06-24 HISTORY — DX: Reserved for inherently not codable concepts without codable children: IMO0001

## 2013-06-24 HISTORY — DX: Personal history of other infectious and parasitic diseases: Z86.19

## 2013-06-24 HISTORY — DX: Gastro-esophageal reflux disease without esophagitis: K21.9

## 2013-06-24 HISTORY — DX: Nocturia: R35.1

## 2013-06-24 HISTORY — DX: Anesthesia of skin: R20.0

## 2013-06-24 HISTORY — DX: Acute embolism and thrombosis of unspecified vein: I82.90

## 2013-06-24 LAB — CBC
HEMATOCRIT: 32.9 % — AB (ref 39.0–52.0)
Hemoglobin: 11.6 g/dL — ABNORMAL LOW (ref 13.0–17.0)
MCH: 37.7 pg — ABNORMAL HIGH (ref 26.0–34.0)
MCHC: 35.3 g/dL (ref 30.0–36.0)
MCV: 106.8 fL — AB (ref 78.0–100.0)
Platelets: 205 10*3/uL (ref 150–400)
RBC: 3.08 MIL/uL — AB (ref 4.22–5.81)
RDW: 14.2 % (ref 11.5–15.5)
WBC: 6.3 10*3/uL (ref 4.0–10.5)

## 2013-06-24 LAB — BASIC METABOLIC PANEL WITH GFR
BUN: 15 mg/dL (ref 6–23)
CO2: 26 meq/L (ref 19–32)
Calcium: 9 mg/dL (ref 8.4–10.5)
Chloride: 103 meq/L (ref 96–112)
Creatinine, Ser: 1.11 mg/dL (ref 0.50–1.35)
GFR calc Af Amer: 76 mL/min — ABNORMAL LOW
GFR calc non Af Amer: 66 mL/min — ABNORMAL LOW
Glucose, Bld: 96 mg/dL (ref 70–99)
Potassium: 4.5 meq/L (ref 3.7–5.3)
Sodium: 141 meq/L (ref 137–147)

## 2013-06-24 MED ORDER — ACETAMINOPHEN-CODEINE #3 300-30 MG PO TABS: 1.0000 | ORAL_TABLET | Freq: Four times a day (QID) | ORAL | Status: DC | PRN

## 2013-06-24 NOTE — Patient Instructions (Signed)
Tyler Diaz  06/24/2013                           YOUR PROCEDURE IS SCHEDULED ON: 06/28/13               PLEASE REPORT TO SHORT STAY CENTER AT :  5:15 AM               CALL THIS NUMBER IF ANY PROBLEMS THE DAY OF SURGERY :               [phone]                      REMEMBER:   Do not eat food or drink liquids AFTER MIDNIGHT   Take these medicines the morning of surgery with A SIP OF WATER:NONE   Do not wear jewelry, make-up   Do not wear lotions, powders, or perfumes.   Do not shave legs or underarms 12 hrs. before surgery (men may shave face)  Do not bring valuables to the hospital.  Contacts, dentures or bridgework may not be worn into surgery.  Leave suitcase in the car. After surgery it may be brought to your room.  For patients admitted to the hospital more than one night, checkout time is 11:00                          The day of discharge.   Patients discharged the day of surgery will not be allowed to drive home                             If going home same day of surgery, must have someone stay with you first                           24 hrs at home and arrange for some one to drive you home from hospital.    Special Instructions:   Please read over the following fact sheets that you were given:               1. STOP VITAMINS 5 DAYS PREOP                     2. [place]                                                X_____________________________________________________________________        Failure to follow these instructions may result in cancellation of your surgery

## 2013-06-24 NOTE — Progress Notes (Signed)
06/24/13 1022  OBSTRUCTIVE SLEEP APNEA  Have you ever been diagnosed with sleep apnea through a sleep study? No  Do you snore loudly (loud enough to be heard through closed doors)?  1  Do you often feel tired, fatigued, or sleepy during the daytime? 1  Has anyone observed you stop breathing during your sleep? 1  Do you have, or are you being treated for high blood pressure? 0  BMI more than 35 kg/m2? 0  Age over 70 years old? 1  Neck circumference greater than 40 cm/18 inches? 0  Gender: 1  Obstructive Sleep Apnea Score 5  Score 4 or greater  Results sent to PCP

## 2013-06-27 NOTE — Anesthesia Preprocedure Evaluation (Addendum)
Anesthesia Evaluation  Patient identified by MRN, date of birth, ID band Patient awake    Reviewed: Allergy & Precautions, H&P , NPO status , Patient's Chart, lab work & pertinent test results  Airway Mallampati: II TM Distance: >3 FB Neck ROM: Full    Dental  (+) Teeth Intact, Dental Advisory Given and Caps   Pulmonary neg pulmonary ROS, asthma , former smoker,  breath sounds clear to auscultation  Pulmonary exam normal       Cardiovascular hypertension, Pt. on medications negative cardio ROS  + Valvular Problems/Murmurs MR Rhythm:Regular Rate:Normal + Systolic murmurs    Neuro/Psych negative neurological ROS  negative psych ROS   GI/Hepatic negative GI ROS, Neg liver ROS, GERD-  ,Neoplasm rectum   Endo/Other  Hypothyroidism   Renal/GU negative Renal ROS  negative genitourinary   Musculoskeletal negative musculoskeletal ROS (+)   Abdominal   Peds negative pediatric ROS (+)  Hematology negative hematology ROS (+) anemia ,   Anesthesia Other Findings Bridge upper incisors.  Reproductive/Obstetrics negative OB ROS                         Anesthesia Physical Anesthesia Plan  ASA: III  Anesthesia Plan: General   Post-op Pain Management:    Induction: Intravenous  Airway Management Planned: Oral ETT  Additional Equipment:   Intra-op Plan:   Post-operative Plan: Extubation in OR  Informed Consent: I have reviewed the patients History and Physical, chart, labs and discussed the procedure including the risks, benefits and alternatives for the proposed anesthesia with the patient or authorized representative who has indicated his/her understanding and acceptance.   Dental advisory given  Plan Discussed with: CRNA  Anesthesia Plan Comments:         Anesthesia Quick Evaluation

## 2013-06-28 ENCOUNTER — Encounter (HOSPITAL_COMMUNITY): Payer: Self-pay

## 2013-06-28 ENCOUNTER — Encounter (HOSPITAL_COMMUNITY): Payer: Medicare Other | Admitting: Anesthesiology

## 2013-06-28 ENCOUNTER — Encounter (HOSPITAL_COMMUNITY)
Admission: RE | Disposition: A | Discharge status: Home / Self Care | Payer: Self-pay | Source: Ambulatory Visit | Attending: Surgery

## 2013-06-28 ENCOUNTER — Inpatient Hospital Stay (HOSPITAL_COMMUNITY): Payer: Medicare Other | Admitting: Anesthesiology

## 2013-06-28 ENCOUNTER — Inpatient Hospital Stay (HOSPITAL_COMMUNITY)
Admission: RE | Admit: 2013-06-28 | Discharge: 2013-07-01 | DRG: 345 | Disposition: A | Discharge status: Home / Self Care | Payer: Medicare Other | Source: Ambulatory Visit | Attending: Surgery | Admitting: Surgery

## 2013-06-28 DIAGNOSIS — Z79899 Other long term (current) drug therapy: Secondary | ICD-10-CM

## 2013-06-28 DIAGNOSIS — Z923 Personal history of irradiation: Secondary | ICD-10-CM

## 2013-06-28 DIAGNOSIS — K5669 Other intestinal obstruction: Secondary | ICD-10-CM | POA: Diagnosis present

## 2013-06-28 DIAGNOSIS — T451X5A Adverse effect of antineoplastic and immunosuppressive drugs, initial encounter: Secondary | ICD-10-CM | POA: Diagnosis present

## 2013-06-28 DIAGNOSIS — E785 Hyperlipidemia, unspecified: Secondary | ICD-10-CM | POA: Diagnosis present

## 2013-06-28 DIAGNOSIS — K624 Stenosis of anus and rectum: Secondary | ICD-10-CM | POA: Diagnosis present

## 2013-06-28 DIAGNOSIS — Z8619 Personal history of other infectious and parasitic diseases: Secondary | ICD-10-CM

## 2013-06-28 DIAGNOSIS — Z9221 Personal history of antineoplastic chemotherapy: Secondary | ICD-10-CM

## 2013-06-28 DIAGNOSIS — K219 Gastro-esophageal reflux disease without esophagitis: Secondary | ICD-10-CM | POA: Diagnosis present

## 2013-06-28 DIAGNOSIS — E039 Hypothyroidism, unspecified: Secondary | ICD-10-CM | POA: Diagnosis present

## 2013-06-28 DIAGNOSIS — C20 Malignant neoplasm of rectum: Secondary | ICD-10-CM | POA: Diagnosis present

## 2013-06-28 DIAGNOSIS — I059 Rheumatic mitral valve disease, unspecified: Secondary | ICD-10-CM | POA: Diagnosis present

## 2013-06-28 DIAGNOSIS — Z432 Encounter for attention to ileostomy: Secondary | ICD-10-CM

## 2013-06-28 DIAGNOSIS — Z932 Ileostomy status: Secondary | ICD-10-CM

## 2013-06-28 DIAGNOSIS — I10 Essential (primary) hypertension: Secondary | ICD-10-CM | POA: Diagnosis present

## 2013-06-28 DIAGNOSIS — R42 Dizziness and giddiness: Secondary | ICD-10-CM | POA: Diagnosis present

## 2013-06-28 DIAGNOSIS — R209 Unspecified disturbances of skin sensation: Secondary | ICD-10-CM | POA: Diagnosis present

## 2013-06-28 DIAGNOSIS — D630 Anemia in neoplastic disease: Secondary | ICD-10-CM | POA: Diagnosis present

## 2013-06-28 HISTORY — PX: ILEOSTOMY CLOSURE: SHX1784

## 2013-06-28 HISTORY — DX: Umbilical hernia with obstruction, without gangrene: K42.0

## 2013-06-28 HISTORY — DX: Zoster without complications: B02.9

## 2013-06-28 LAB — TYPE AND SCREEN
ABO/RH(D): A POS
Antibody Screen: NEGATIVE

## 2013-06-28 SURGERY — CLOSURE, ILEOSTOMY
Anesthesia: General | Site: Abdomen

## 2013-06-28 MED ORDER — ACETAMINOPHEN 500 MG PO TABS: 500.0000 mg | ORAL_TABLET | Freq: Four times a day (QID) | ORAL | Status: DC | PRN

## 2013-06-28 MED ORDER — KETOROLAC TROMETHAMINE 30 MG/ML IJ SOLN
INTRAMUSCULAR | Status: AC
  Filled 2013-06-28: qty 1

## 2013-06-28 MED ORDER — DEXTROSE 5 % IV SOLN
2.0000 g | Freq: Two times a day (BID) | INTRAVENOUS | Status: AC
  Administered 2013-06-28: 2 g via INTRAVENOUS
  Filled 2013-06-28: qty 2

## 2013-06-28 MED ORDER — HYDROMORPHONE HCL PF 1 MG/ML IJ SOLN
INTRAMUSCULAR | Status: AC
  Administered 2013-06-28: 1 mg
  Filled 2013-06-28: qty 1

## 2013-06-28 MED ORDER — PROPOFOL 10 MG/ML IV BOLUS
INTRAVENOUS | Status: DC | PRN
  Administered 2013-06-28: 140 mg via INTRAVENOUS

## 2013-06-28 MED ORDER — FENTANYL CITRATE 0.05 MG/ML IJ SOLN
INTRAMUSCULAR | Status: AC
  Filled 2013-06-28: qty 2

## 2013-06-28 MED ORDER — GLYCOPYRROLATE 0.2 MG/ML IJ SOLN
INTRAMUSCULAR | Status: DC | PRN
  Administered 2013-06-28: 0.6 mg via INTRAVENOUS

## 2013-06-28 MED ORDER — BUPIVACAINE-EPINEPHRINE PF 0.25-1:200000 % IJ SOLN
INTRAMUSCULAR | Status: AC
  Filled 2013-06-28: qty 30

## 2013-06-28 MED ORDER — METOPROLOL TARTRATE 1 MG/ML IV SOLN
5.0000 mg | Freq: Four times a day (QID) | INTRAVENOUS | Status: DC | PRN
  Filled 2013-06-28: qty 5

## 2013-06-28 MED ORDER — LACTATED RINGERS IV SOLN: INTRAVENOUS | Status: DC

## 2013-06-28 MED ORDER — HYDROMORPHONE HCL PF 1 MG/ML IJ SOLN
0.5000 mg | INTRAMUSCULAR | Status: DC | PRN
  Administered 2013-06-28 – 2013-06-30 (×11): 1 mg via INTRAVENOUS
  Filled 2013-06-28 (×12): qty 1

## 2013-06-28 MED ORDER — HEPARIN SODIUM (PORCINE) 5000 UNIT/ML IJ SOLN
5000.0000 [IU] | Freq: Three times a day (TID) | INTRAMUSCULAR | Status: DC
  Administered 2013-06-28 – 2013-07-01 (×8): 5000 [IU] via SUBCUTANEOUS
  Filled 2013-06-28 (×11): qty 1

## 2013-06-28 MED ORDER — BUPIVACAINE-EPINEPHRINE 0.25% -1:200000 IJ SOLN
INTRAMUSCULAR | Status: AC
  Filled 2013-06-28: qty 1

## 2013-06-28 MED ORDER — HYDROMORPHONE HCL PF 1 MG/ML IJ SOLN
INTRAMUSCULAR | Status: AC
  Filled 2013-06-28: qty 1

## 2013-06-28 MED ORDER — ROCURONIUM BROMIDE 100 MG/10ML IV SOLN
INTRAVENOUS | Status: DC | PRN
  Administered 2013-06-28 (×2): 10 mg via INTRAVENOUS
  Administered 2013-06-28: 20 mg via INTRAVENOUS

## 2013-06-28 MED ORDER — ALVIMOPAN 12 MG PO CAPS
12.0000 mg | ORAL_CAPSULE | Freq: Once | ORAL | Status: AC
  Administered 2013-06-28: 12 mg via ORAL
  Filled 2013-06-28: qty 1

## 2013-06-28 MED ORDER — DIPHENHYDRAMINE HCL 12.5 MG/5ML PO ELIX: 12.5000 mg | ORAL_SOLUTION | Freq: Four times a day (QID) | ORAL | Status: DC | PRN

## 2013-06-28 MED ORDER — HYDROCORTISONE 2.5 % RE CREA
1.0000 "application " | TOPICAL_CREAM | Freq: Two times a day (BID) | RECTAL | Status: DC | PRN
  Filled 2013-06-28: qty 28.35

## 2013-06-28 MED ORDER — HEPARIN SODIUM (PORCINE) 5000 UNIT/ML IJ SOLN
5000.0000 [IU] | Freq: Once | INTRAMUSCULAR | Status: AC
  Administered 2013-06-28: 5000 [IU] via SUBCUTANEOUS
  Filled 2013-06-28: qty 1

## 2013-06-28 MED ORDER — HYDROMORPHONE HCL PF 1 MG/ML IJ SOLN
0.2500 mg | INTRAMUSCULAR | Status: DC | PRN
  Administered 2013-06-28 (×4): 0.5 mg via INTRAVENOUS

## 2013-06-28 MED ORDER — LACTATED RINGERS IV SOLN
INTRAVENOUS | Status: DC | PRN
  Administered 2013-06-28 (×2): via INTRAVENOUS

## 2013-06-28 MED ORDER — DEXAMETHASONE SODIUM PHOSPHATE 10 MG/ML IJ SOLN
INTRAMUSCULAR | Status: DC | PRN
  Administered 2013-06-28: 10 mg via INTRAVENOUS

## 2013-06-28 MED ORDER — SACCHAROMYCES BOULARDII 250 MG PO CAPS
250.0000 mg | ORAL_CAPSULE | Freq: Two times a day (BID) | ORAL | Status: DC
  Administered 2013-06-28 – 2013-07-01 (×6): 250 mg via ORAL
  Filled 2013-06-28 (×8): qty 1

## 2013-06-28 MED ORDER — NEOSTIGMINE METHYLSULFATE 1 MG/ML IJ SOLN
INTRAMUSCULAR | Status: AC
  Filled 2013-06-28: qty 10

## 2013-06-28 MED ORDER — PROMETHAZINE HCL 25 MG/ML IJ SOLN: 6.2500 mg | INTRAMUSCULAR | Status: DC | PRN

## 2013-06-28 MED ORDER — DIPHENHYDRAMINE HCL 50 MG/ML IJ SOLN: 12.5000 mg | Freq: Four times a day (QID) | INTRAMUSCULAR | Status: DC | PRN

## 2013-06-28 MED ORDER — DIAZEPAM 2 MG PO TABS: 2.0000 mg | ORAL_TABLET | Freq: Every evening | ORAL | Status: DC | PRN

## 2013-06-28 MED ORDER — DEXAMETHASONE SODIUM PHOSPHATE 10 MG/ML IJ SOLN
INTRAMUSCULAR | Status: AC
  Filled 2013-06-28: qty 1

## 2013-06-28 MED ORDER — ONDANSETRON HCL 4 MG/2ML IJ SOLN
INTRAMUSCULAR | Status: DC | PRN
  Administered 2013-06-28: 4 mg via INTRAVENOUS

## 2013-06-28 MED ORDER — ALVIMOPAN 12 MG PO CAPS
12.0000 mg | ORAL_CAPSULE | Freq: Two times a day (BID) | ORAL | Status: DC
  Administered 2013-06-29 – 2013-06-30 (×3): 12 mg via ORAL
  Filled 2013-06-28 (×4): qty 1

## 2013-06-28 MED ORDER — KETOROLAC TROMETHAMINE 30 MG/ML IJ SOLN
INTRAMUSCULAR | Status: DC | PRN
  Administered 2013-06-28: 30 mg via INTRAVENOUS

## 2013-06-28 MED ORDER — PROPOFOL 10 MG/ML IV BOLUS
INTRAVENOUS | Status: AC
  Filled 2013-06-28: qty 20

## 2013-06-28 MED ORDER — ONDANSETRON HCL 4 MG/2ML IJ SOLN
INTRAMUSCULAR | Status: AC
  Filled 2013-06-28: qty 2

## 2013-06-28 MED ORDER — FERROUS SULFATE 325 (65 FE) MG PO TABS
325.0000 mg | ORAL_TABLET | Freq: Three times a day (TID) | ORAL | Status: DC
  Administered 2013-06-28 – 2013-07-01 (×8): 325 mg via ORAL
  Filled 2013-06-28 (×13): qty 1

## 2013-06-28 MED ORDER — ADULT MULTIVITAMIN W/MINERALS CH
1.0000 | ORAL_TABLET | Freq: Every day | ORAL | Status: DC
  Administered 2013-06-28 – 2013-07-01 (×4): 1 via ORAL
  Filled 2013-06-28 (×5): qty 1

## 2013-06-28 MED ORDER — PROMETHAZINE HCL 25 MG/ML IJ SOLN: 6.2500 mg | Freq: Four times a day (QID) | INTRAMUSCULAR | Status: DC | PRN

## 2013-06-28 MED ORDER — ACETAMINOPHEN 500 MG PO TABS
1000.0000 mg | ORAL_TABLET | Freq: Three times a day (TID) | ORAL | Status: DC
Start: 2013-06-28 — End: 2013-07-01
  Administered 2013-06-28 – 2013-07-01 (×8): 1000 mg via ORAL
  Filled 2013-06-28 (×11): qty 2

## 2013-06-28 MED ORDER — ROCURONIUM BROMIDE 100 MG/10ML IV SOLN
INTRAVENOUS | Status: AC
  Filled 2013-06-28: qty 1

## 2013-06-28 MED ORDER — FENTANYL CITRATE 0.05 MG/ML IJ SOLN
INTRAMUSCULAR | Status: AC
  Filled 2013-06-28: qty 5

## 2013-06-28 MED ORDER — DEXTROSE 5 % IV SOLN
2.0000 g | INTRAVENOUS | Status: AC
  Administered 2013-06-28: 2 g via INTRAVENOUS
  Filled 2013-06-28: qty 2

## 2013-06-28 MED ORDER — BISMUTH SUBSALICYLATE 262 MG/15ML PO SUSP
30.0000 mL | Freq: Three times a day (TID) | ORAL | Status: DC | PRN
  Administered 2013-06-29: 30 mL via ORAL
  Filled 2013-06-28: qty 236

## 2013-06-28 MED ORDER — ALPRAZOLAM 0.5 MG PO TABS: 0.5000 mg | ORAL_TABLET | Freq: Every evening | ORAL | Status: DC | PRN

## 2013-06-28 MED ORDER — ALUM & MAG HYDROXIDE-SIMETH 200-200-20 MG/5ML PO SUSP: 30.0000 mL | Freq: Four times a day (QID) | ORAL | Status: DC | PRN

## 2013-06-28 MED ORDER — 0.9 % SODIUM CHLORIDE (POUR BTL) OPTIME
TOPICAL | Status: DC | PRN
  Administered 2013-06-28: 2000 mL

## 2013-06-28 MED ORDER — NEOSTIGMINE METHYLSULFATE 1 MG/ML IJ SOLN
INTRAMUSCULAR | Status: DC | PRN
  Administered 2013-06-28: 4 mg via INTRAVENOUS

## 2013-06-28 MED ORDER — ZOLPIDEM TARTRATE 5 MG PO TABS: 5.0000 mg | ORAL_TABLET | Freq: Every evening | ORAL | Status: DC | PRN

## 2013-06-28 MED ORDER — GLYCOPYRROLATE 0.2 MG/ML IJ SOLN
INTRAMUSCULAR | Status: AC
  Filled 2013-06-28: qty 3

## 2013-06-28 MED ORDER — FENTANYL CITRATE 0.05 MG/ML IJ SOLN
INTRAMUSCULAR | Status: DC | PRN
  Administered 2013-06-28 (×5): 50 ug via INTRAVENOUS
  Administered 2013-06-28: 100 ug via INTRAVENOUS

## 2013-06-28 MED ORDER — SUCCINYLCHOLINE CHLORIDE 20 MG/ML IJ SOLN
INTRAMUSCULAR | Status: DC | PRN
  Administered 2013-06-28: 100 mg via INTRAVENOUS

## 2013-06-28 MED ORDER — KCL IN DEXTROSE-NACL 40-5-0.9 MEQ/L-%-% IV SOLN
INTRAVENOUS | Status: DC
  Administered 2013-06-28 – 2013-06-30 (×3): via INTRAVENOUS
  Filled 2013-06-28 (×4): qty 1000

## 2013-06-28 MED ORDER — BUPIVACAINE-EPINEPHRINE 0.25% -1:200000 IJ SOLN
INTRAMUSCULAR | Status: DC | PRN
  Administered 2013-06-28: 50 mL

## 2013-06-28 SURGICAL SUPPLY — 52 items
BLADE EXTENDED COATED 6.5IN (ELECTRODE) ×2 IMPLANT
BLADE HEX COATED 2.75 (ELECTRODE) ×6 IMPLANT
BLADE SURG SZ10 CARB STEEL (BLADE) ×3 IMPLANT
CANISTER SUCTION 2500CC (MISCELLANEOUS) ×1 IMPLANT
CATH KIT ON Q 7.5IN SLV (PAIN MANAGEMENT) IMPLANT
COUNTER NEEDLE 20 DBL MAG RED (NEEDLE) ×1 IMPLANT
COVER MAYO STAND STRL (DRAPES) ×4 IMPLANT
DRAIN CHANNEL 19F RND (DRAIN) IMPLANT
DRAPE LAPAROSCOPIC ABDOMINAL (DRAPES) ×3 IMPLANT
DRAPE LG THREE QUARTER DISP (DRAPES) ×7 IMPLANT
DRAPE UTILITY XL STRL (DRAPES) ×4 IMPLANT
DRAPE WARM FLUID 44X44 (DRAPE) ×3 IMPLANT
DRSG OPSITE POSTOP 4X10 (GAUZE/BANDAGES/DRESSINGS) IMPLANT
DRSG OPSITE POSTOP 4X6 (GAUZE/BANDAGES/DRESSINGS) ×2 IMPLANT
DRSG OPSITE POSTOP 4X8 (GAUZE/BANDAGES/DRESSINGS) IMPLANT
ELECT REM PT RETURN 9FT ADLT (ELECTROSURGICAL) ×3
ELECTRODE REM PT RTRN 9FT ADLT (ELECTROSURGICAL) ×1 IMPLANT
GLOVE ECLIPSE 8.0 STRL XLNG CF (GLOVE) ×8 IMPLANT
GLOVE INDICATOR 8.0 STRL GRN (GLOVE) ×6 IMPLANT
GOWN STRL REUS W/TWL XL LVL3 (GOWN DISPOSABLE) ×16 IMPLANT
KIT BASIN OR (CUSTOM PROCEDURE TRAY) ×3 IMPLANT
LEGGING LITHOTOMY PAIR STRL (DRAPES) ×3 IMPLANT
LIGASURE IMPACT 36 18CM CVD LR (INSTRUMENTS) IMPLANT
MANIFOLD NEPTUNE II (INSTRUMENTS) ×2 IMPLANT
PACK GENERAL/GYN (CUSTOM PROCEDURE TRAY) ×3 IMPLANT
PENCIL BUTTON HOLSTER BLD 10FT (ELECTRODE) ×1 IMPLANT
SPONGE GAUZE 4X4 12PLY (GAUZE/BANDAGES/DRESSINGS) ×1 IMPLANT
STAPLER GUN LINEAR PROX 60 (STAPLE) ×2 IMPLANT
STAPLER VISISTAT 35W (STAPLE) ×3 IMPLANT
SUCTION POOLE TIP (SUCTIONS) ×3 IMPLANT
SUT MNCRL AB 4-0 PS2 18 (SUTURE) ×2 IMPLANT
SUT NOV 1 T60/GS (SUTURE) IMPLANT
SUT NOVA NAB DX-16 0-1 5-0 T12 (SUTURE) IMPLANT
SUT NOVA T20/GS 25 (SUTURE) IMPLANT
SUT PDS AB 1 CTX 36 (SUTURE) ×4 IMPLANT
SUT SILK 2 0 (SUTURE) ×3
SUT SILK 2 0 SH CR/8 (SUTURE) ×2 IMPLANT
SUT SILK 2 0SH CR/8 30 (SUTURE) ×1 IMPLANT
SUT SILK 2-0 18XBRD TIE 12 (SUTURE) ×1 IMPLANT
SUT SILK 3 0 (SUTURE) ×3
SUT SILK 3 0 SH CR/8 (SUTURE) ×3 IMPLANT
SUT SILK 3-0 18XBRD TIE 12 (SUTURE) ×1 IMPLANT
SUT VIC AB 2-0 SH 18 (SUTURE) ×1 IMPLANT
SUT VIC AB 3-0 SH 18 (SUTURE) ×1 IMPLANT
SUT VICRYL 2 0 18  UND BR (SUTURE)
SUT VICRYL 2 0 18 UND BR (SUTURE) ×1 IMPLANT
TAPE UMBILICAL COTTON 1/8X30 (MISCELLANEOUS) ×2 IMPLANT
TOWEL OR 17X26 10 PK STRL BLUE (TOWEL DISPOSABLE) ×6 IMPLANT
TOWEL OR NON WOVEN STRL DISP B (DISPOSABLE) ×6 IMPLANT
TRAY FOLEY CATH 14FRSI W/METER (CATHETERS) ×1 IMPLANT
TUNNELER SHEATH ON-Q 16GX12 DP (PAIN MANAGEMENT) IMPLANT
YANKAUER SUCT BULB TIP 10FT TU (MISCELLANEOUS) ×3 IMPLANT

## 2013-06-28 NOTE — Transfer of Care (Signed)
Immediate Anesthesia Transfer of Care Note  Patient: Tyler Diaz  Procedure(s) Performed: Procedure(s): loop ILEOSTOMY TAKEDOWN examination of anesthesia  (N/A)  Patient Location: PACU  Anesthesia Type:General  Level of Consciousness: awake, oriented and patient cooperative  Airway & Oxygen Therapy: Patient Spontanous Breathing and Patient connected to face mask oxygen  Post-op Assessment: Report given to PACU RN and Post -op Vital signs reviewed and stable  Post vital signs: Reviewed and stable  Complications: No apparent anesthesia complications

## 2013-06-28 NOTE — Anesthesia Postprocedure Evaluation (Signed)
Anesthesia Post Note  Patient: Tyler Diaz  Procedure(s) Performed: Procedure(s) (LRB): loop ILEOSTOMY TAKEDOWN examination of anesthesia  (N/A)  Anesthesia type: General  Patient location: PACU  Post pain: Pain level controlled  Post assessment: Post-op Vital signs reviewed  Last Vitals:  Filed Vitals:   06/28/13 1011  BP: 149/75  Pulse: 90  Temp: 36.4 C  Resp: 15    Post vital signs: Reviewed  Level of consciousness: sedated  Complications: No apparent anesthesia complications

## 2013-06-28 NOTE — Preoperative (Signed)
Beta Blockers   Reason not to administer Beta Blockers:Not Applicable 

## 2013-06-28 NOTE — Op Note (Signed)
06/28/2013  9:27 AM  PATIENT:  Tyler Diaz  68 y.o. male  Patient Care Team: Chipper Herb, MD as PCP - General (Family Medicine) Adin Hector, MD as Consulting Physician (Colon and Rectal Surgery) Lear Ng, MD as Consulting Physician (Gastroenterology) Gatha Mayer, MD as Consulting Physician (Radiation Oncology) Carola Frost, RN as Registered Nurse (Oncology) [name], MD as Consulting Physician (Oncology)  PRE-OPERATIVE DIAGNOSIS:    Rectal cancer Status post low anterior resection, coloanal anastomosis, diverting loop ileostomy.  Stricture of coloanal anastomosis.  POST-OPERATIVE DIAGNOSIS:    Rectal cancer Status post low anterior resection, coloanal anastomosis, diverting loop ileostomy.  Stricture of coloanal anastomosis.  PROCEDURE:  Procedure(s): loop ILEOSTOMY TAKEDOWN Dilation of coloanal anastomosis Examination of anesthesia   SURGEON:  Surgeon(s): Adin Hector, MD Edward Jolly, MD - Asst  ANESTHESIA:   local and general  EBL:  Total I/O In: 1300 [I.V.:1300] Out: 300 [Urine:200; Blood:100]  Delay start of Pharmacological VTE agent (>24hrs) due to surgical blood loss or risk of bleeding:  no  DRAINS: none   SPECIMEN:  Source of Specimen:  loop ileostomy  DISPOSITION OF SPECIMEN:  PATHOLOGY  COUNTS:  YES  PLAN OF CARE: Admit to inpatient   PATIENT DISPOSITION:  PACU - guarded condition.  INDICATION:   Pleasant elderly gentleman with distal rectal cancer.  Status post neoadjuvant chemoradiation therapy.  Status post laparoscopic low anterior resection with coloanal anastomosis and diverting loop ileostomy July 2014.  Stage III.  Underwent post adjuvant Develop DVT.  Anticoagulated.  Now off anticoagulation.  He requested ileostomy takedown.  Mild stricturing coloanal anastomosis and able to be serially dilated and office.  I recommended takedown of ileostomy and examination under anesthesia:  The anatomy &  physiology of the digestive tract was discussed.  The pathophysiology was discussed.  Possibility of remaining with an ostomy permanently was discussed.  I offered ostomy takedown.  Laparoscopic & open techniques were discussed.   Risks such as bleeding, infection, abscess, leak, reoperation, possible re-ostomy, injury to other organs, hernia, heart attack, death, and other risks were discussed.   I noted a good likelihood this will help address the problem.  Goals of post-operative recovery were discussed as well.  We will work to minimize complications.  Questions were answered.  The patient expresses understanding & wishes to proceed with surgery.    OR FINDINGS: Mild narrow stricture at coloanal anastomosis easily dilated.  Anastomosis otherwise well healed.  Moderate adhesions of loop ileostomy to abdominal wall.  No parastomal hernia.  It is a side-to-side stapled ileoileal anastomosis close to the terminal ileum.  DESCRIPTION:   Informed consent was confirmed.   The patient received IV antibiotics and underwent general anesthesia without any difficulty. The patient was positioned in low litholtomy. Foley catheter was sterilely placed. SCDs were active during the entire case.  The abdomen was cprepped and draped in a sterile fashion.  A surgical timeout confirmed our plan.  I performed examination under anesthesia of the perineum.  Mild stricturing noted at coloanal anastomosis easily dilated with finger.  Digital inspection noted scant retained mucus.  No evidence of fistula leak or irritation.  Sphincter intact.  Anorectal block placed with local anesthetic (quarter percent bupivacaine x15 mL) these.  I changed gown/gloves.  Surgical timeout confirmed for ileostomy takedown.  I proceeded to make a circular incision around the loop ileostomy.  The loop ileostomy was freed from adhesions to the subcutaneous and fascial layers of the  abdominal wall using focused cautery and sharp dissection.   Was able to breech into the peritoneum.  No adhesions felt.  We could eviscerate the small bowel.  I did a side-to-side stapled anastomosis using a GIA-75 stapler.  Then used a TLX 60 to staple off the common defect.  Used interrupted 2-0 silk stitches to close the mesenteric defect and bury the TX staple line.  Hemostasis was excellent.  Ileostomy returned into the peritoneal cavity..  We changed gloves.  Wound irrigated.  Fascia closed using #1 PDS in a running fashion.  Skin excised for a more transverse elliptical wound to allow it to come together without dog-earing.  Closed with interrupted 4 Monocryl, umbilical tape wicks x3 , and sterile dressing.  Patient extubated and in recovery room in stable condition.   I discussed postoperative findings with the patient's family.  Questions answered.  They expressed understanding and appreciation.

## 2013-06-28 NOTE — H&P (Signed)
Tyler Diaz  1945-07-10 [id number]  CARE TEAM:  PCP: Redge Gainer, MD  Outpatient Care Team: Patient Care Team: Chipper Herb, MD as PCP - General (Family Medicine) Adin Hector, MD as Consulting Physician (Colon and Rectal Surgery) Lear Ng, MD as Consulting Physician (Gastroenterology) Gatha Mayer, MD as Consulting Physician (Radiation Oncology) Carola Frost, RN as Registered Nurse (Oncology) [name], MD as Consulting Physician (Oncology)  Inpatient Treatment Team: Treatment Team: Attending Provider: Adin Hector, MD  This patient is a 68 y.o.male who presents today for surgical evaluation.  Reason for evaluation: Consideration of takedown of ileostomy.  Patient status post resection of rectal cancer with very low coloanal anastomosis and diverting loop ileostomy.  Develop DVT.  Treated.  Post adjuvant chemotherapy.  Completed.  Wishes ileostomy takedown.  No leaking apparent.  Tolerating pouch.  Diarrhea controlled  Past Medical History  Diagnosis Date  . Mitral regurgitation due to partially flail posterior mitral valve leaflet   . Rectal cancer   . Hyperlipidemia   . Colon cancer 07/18/12 bx    rectum=Invasive adenocarcinoma w/ extracellular mucin,polyp=benign  . Rheumatic fever as child    hx  . Hearing loss     partial greater on left  . Tinnitus     left ear  . Sinusitis     seasonal  . Arthritis     hands/knees  . Hematochezia     recent  . Heart murmur   . Hypothyroidism as child    hx of  . Numbness     TOES / FINGERS DUE TO CHEMO  . History of shingles   . Blood clot in vein     RT ARM WITH PICC LINE   . GERD (gastroesophageal reflux disease)     OCCASIONAL  . Nocturia   . Radiation     FOR COLON CANCER    Past Surgical History  Procedure Laterality Date  . Carpal tunnel release Right 2002  . Tonsillectomy      as child  . Laparoscopic low anterior resection N/A 12/27/2012    Procedure: LAPAROSCOPIC LOW ANTERIOR  RESECTION, COLO-ANAL ANASTOMOSIS, DIVERTING LOOP ILEOSTOMY,SPLENIC FLEXURE MOBILIZATION, PRIMARY INCARCERATED UMBILICAL HERNIA REPAIR;  Surgeon: Adin Hector, MD;  Location: [facility];  Service: General;  Laterality: N/A;  . Laparoscopic low anterior rescection with coloanal anastomosis N/A 12/27/2012    Procedure: LAPAROSCOPIC LOW ANTERIOR RESCECTION WITH COLOANAL ANASTOMOSIS;  Surgeon: Adin Hector, MD;  Location: [facility];  Service: General;  Laterality: N/A;  . Ileostomy N/A 12/27/2012    Procedure: DIVERTING LOOP ILEOSTOMY;  Surgeon: Adin Hector, MD;  Location: [facility];  Service: General;  Laterality: N/A;  . Umbilical hernia repair  12/27/2012    Procedure: PRIMARY REPAIR INCARCERATED UMBILICAL HERNIA;  Surgeon: Adin Hector, MD;  Location: [facility];  Service: General;;  . Hernia repair      History   Social History  . Marital Status: Married    Spouse Name: N/A    Number of Children: 2  . Years of Education: N/A   Occupational History  . Retired     Chiropractor   Social History Main Topics  . Smoking status: Former Smoker -- 1.50 packs/day for 20 years    Types: Cigarettes    Quit date: 07/27/1972  . Smokeless tobacco: Never Used  . Alcohol Use: No  . Drug Use: No     Comment: quit smoking 35 years ago  .  Sexual Activity: Not on file   Other Topics Concern  . 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  Married    Family History  Problem Relation Age of Onset  . Cancer Mother     abdominal ca ?ovarian?  . Cancer Sister     liver cancer    Current Facility-Administered Medications  Medication Dose Route Frequency Provider Last Rate Last Dose  . cefoTEtan (CEFOTAN) 2 g in dextrose 5 % 50 mL IVPB  2 g Intravenous On Call to OR Adin Hector, MD       Facility-Administered Medications Ordered in Other Encounters  Medication Dose Route Frequency Provider Last Rate Last Dose  . lactated ringers  infusion    Continuous PRN Dione Booze, CRNA         Allergies  Allergen Reactions  . Shrimp [Shellfish Allergy] Other (See Comments)    Whelps and sick     BP 143/81  Pulse 87  Temp(Src) 97.8 F (36.6 C) (Oral)  Resp 18  SpO2 100%  No results found.  Review of Systems  Constitutional: Negative for fever, chills, diaphoresis, activity change, appetite change, fatigue and unexpected weight change.  HENT: Negative for sore throat and trouble swallowing.  Eyes: Negative for photophobia and visual disturbance.  Respiratory: Negative for choking and shortness of breath.  Cardiovascular: Negative for chest pain, palpitations and leg swelling.  Gastrointestinal: Positive for diarrhea. Negative for nausea, vomiting, abdominal pain, constipation, blood in stool, abdominal distention, anal bleeding and rectal pain.  Genitourinary: Negative for dysuria, urgency, frequency, enuresis, difficulty urinating and testicular pain.  Musculoskeletal: Negative for arthralgias, back pain, gait problem, myalgias and neck pain.  Skin: Negative for color change, rash and wound.  Neurological: Negative for dizziness, speech difficulty, weakness and numbness.  Hematological: Negative for adenopathy.  Psychiatric/Behavioral: Negative for hallucinations, confusion and agitation.  Objective:   Physical Exam  Constitutional: He is oriented to person, place, and time. He appears well-developed and well-nourished. No distress.  HENT:  Head: Normocephalic.  Mouth/Throat: Oropharynx is clear and moist. No oropharyngeal exudate.  Eyes: Conjunctivae and EOM are normal. Pupils are equal, round, and reactive to light. No scleral icterus.  Neck: Normal range of motion. No tracheal deviation present.  Cardiovascular: Normal rate, normal heart sounds and intact distal pulses.  Pulmonary/Chest: Effort normal. No respiratory distress.  Abdominal: Soft. He exhibits no distension. There is no tenderness. There is no  rigidity, no rebound, no guarding, no tenderness at McBurney's point and negative Murphy's sign. No hernia. Hernia confirmed negative in the right inguinal area and confirmed negative in the left inguinal area.    Incisions clean with normal healing ridges. No hernias  Genitourinary:  Exam done with assistance of male Medical Assistant in the room. Perianal skin clean with good hygiene. No pruritis. No pilonidal disease. No fissure. No abscess/fistula.   No external skin tags / hemorrhoids of significance. Tolerates digital rectal exam. Normal sphincter tone. No rectal masses.   Stricture at coloanal anastomosis 2-3 cm from anal verge. I dilated up w finger. Encountered soft mucus balls just above the anastomosis.  Musculoskeletal: Normal range of motion. He exhibits no tenderness.  Neurological: He is alert and oriented to person, place, and time. No cranial nerve deficit or sensory deficit. He exhibits normal muscle tone. Coordination normal. GCS eye subscore is 4. GCS verbal subscore is 5. GCS motor subscore is 6.  Had mild tremor of left  arm, and then resolved.  Skin: Skin is warm and dry. No rash noted. He is not diaphoretic.  Psychiatric: His behavior is normal. Judgment and thought content normal. His mood appears not anxious. His affect is not labile. His speech is not slurred. Cognition and memory are normal. He exhibits a depressed mood. He is communicative.  Results:   Labs: No results found for this or any previous visit (from the past 48 hour(s)).  Imaging / Studies: No results found.  Medications / Allergies: per chart  Antibiotics: Anti-infectives   Start     Dose/Rate Route Frequency Ordered Stop   06/28/13 0511  cefoTEtan (CEFOTAN) 2 g in dextrose 5 % 50 mL IVPB     2 g 100 mL/hr over 30 Minutes Intravenous On call to O.R. 06/28/13 0511 06/29/13 0559      Assessment  Tyler Diaz  67 y.o. male  Day of Surgery  Procedure(s): loop ILEOSTOMY TAKEDOWN examination  of anesthesia rigid proctoscopy   Problem List:  Active Problems:   * No active hospital problems. *   Gradually improving.   Plan:   Continue antidiarrheals as needed. Follow closely   Increase oral fluid intake.   Ileostomy takedown.   The anatomy & physiology of the digestive tract was discussed. The pathophysiology was discussed. Possibility of remaining with an ostomy permanently was discussed. I offered ostomy takedown. Laparoscopic & open techniques were discussed.  Risks such as bleeding, infection, abscess, leak, reoperation, possible re-ostomy, injury to other organs, hernia, heart attack, death, and other risks were discussed. I noted a good likelihood this will help address the problem. Goals of post-operative recovery were discussed as well. We will work to minimize complications. Questions were answered. The patient expresses understanding & wishes to proceed with surgery.   Please continue his Kegel pelvic floor exercises.  Adin Hector, M.D., F.A.C.S. Gastrointestinal and Minimally Invasive Surgery [facility] [address], [place], [place] [address] [phone] Main / Paging   06/28/2013  Note: This dictation was prepared with Dragon/digital dictation along with [facility] technology. Any transcriptional errors that result from this process are unintentional.

## 2013-06-29 LAB — BASIC METABOLIC PANEL
BUN: 17 mg/dL (ref 6–23)
CHLORIDE: 103 meq/L (ref 96–112)
CO2: 24 mEq/L (ref 19–32)
Calcium: 8.8 mg/dL (ref 8.4–10.5)
Creatinine, Ser: 0.91 mg/dL (ref 0.50–1.35)
GFR calc non Af Amer: 84 mL/min — ABNORMAL LOW (ref >90)
Glucose, Bld: 105 mg/dL — ABNORMAL HIGH (ref 70–99)
POTASSIUM: 4.6 meq/L (ref 3.7–5.3)
Sodium: 138 mEq/L (ref 137–147)

## 2013-06-29 LAB — CBC
HEMATOCRIT: 31.1 % — AB (ref 39.0–52.0)
Hemoglobin: 10.2 g/dL — ABNORMAL LOW (ref 13.0–17.0)
MCH: 35.1 pg — ABNORMAL HIGH (ref 26.0–34.0)
MCHC: 32.8 g/dL (ref 30.0–36.0)
MCV: 106.9 fL — ABNORMAL HIGH (ref 78.0–100.0)
Platelets: 203 10*3/uL (ref 150–400)
RBC: 2.91 MIL/uL — AB (ref 4.22–5.81)
RDW: 14.2 % (ref 11.5–15.5)
WBC: 13.1 10*3/uL — ABNORMAL HIGH (ref 4.0–10.5)

## 2013-06-29 LAB — MAGNESIUM: Magnesium: 1.7 mg/dL (ref 1.5–2.5)

## 2013-06-29 MED ORDER — LIP MEDEX EX OINT
TOPICAL_OINTMENT | CUTANEOUS | Status: AC
  Filled 2013-06-29: qty 7

## 2013-06-29 NOTE — Progress Notes (Signed)
1 Day Post-Op ileostomy take down Subjective: Did well overnight, min nausea this am but otherwise tolerating clears.  Pain controlled.  Ambulating.  Foley out.  Urinating well.   Objective: Vital signs in last 24 hours: Temp:  [97.3 F (36.3 C)-98.2 F (36.8 C)] 98.2 F (36.8 C) (01/24 1000) Pulse Rate:  [70-106] 90 (01/24 1000) Resp:  [16-18] 18 (01/24 1000) BP: (124-163)/(59-78) 124/62 mmHg (01/24 1000) SpO2:  [97 %-100 %] 97 % (01/24 1000) Weight:  [184 lb 3.2 oz (83.553 kg)] 184 lb 3.2 oz (83.553 kg) (01/24 0541)   Intake/Output from previous day: 01/23 0701 - 01/24 0700 In: 2563.3 [I.V.:2513.3; IV Piggyback:50] Out: 1300 [Urine:1200; Blood:100] Intake/Output this shift: Total I/O In: 205.8 [I.V.:205.8] Out: 300 [Urine:300]   General appearance: alert and cooperative GI: normal findings: soft, non-tender  Incision: slight bloody drainage  Lab Results:   Recent Labs  06/29/13 0530  WBC 13.1*  HGB 10.2*  HCT 31.1*  PLT 203   BMET  Recent Labs  06/29/13 0530  NA 138  K 4.6  CL 103  CO2 24  GLUCOSE 105*  BUN 17  CREATININE 0.91  CALCIUM 8.8   PT/INR No results found for this basename: LABPROT, INR,  in the last 72 hours ABG No results found for this basename: PHART, PCO2, PO2, HCO3,  in the last 72 hours  MEDS, Scheduled . acetaminophen  1,000 mg Oral TID  . alvimopan  12 mg Oral BID  . ferrous sulfate  325 mg Oral TID WC  . heparin subcutaneous  5,000 Units Subcutaneous Q8H  . lip balm      . multivitamin with minerals  1 tablet Oral Daily  . saccharomyces boulardii  250 mg Oral BID    Studies/Results: No results found.  Assessment: s/p Procedure(s): loop ILEOSTOMY TAKEDOWN examination of anesthesia  Patient Active Problem List   Diagnosis Date Noted  . Stricture of coloanal anastomosis 04/17/2013  . Ileostomy - diverting loop - s/p takedown 06/28/2013 01/14/2013  . Anemia in neoplastic disease 01/14/2013  . Bilateral inguinal hernia  (BIH) 01/03/2013  . Mitral regurgitation due to partially flail posterior mitral valve leaflet 07/30/2012  . Hyperlipidemia   . Rectal cancer - distal s/p lap LAR/coloanal July 2014 - [id number]. 07/27/2012  . Hypertension   . Vertigo     Doing well post operatively.   Plan: continue clears as tolerated Cont to ambulate   LOS: 1 day     .Tyler Diaz, [facility], [place]   06/29/2013 11:04 AM

## 2013-06-29 NOTE — Evaluation (Signed)
Physical Therapy Evaluation Patient Details Name: Tyler Diaz MRN: [id number] DOB: [birth year] Today's Date: 06/29/2013 Time: 8676-1950 PT Time Calculation (min): 22 min  PT Assessment / Plan / Recommendation History of Present Illness  Pt admitted to have colostomy reversed  Clinical Impression  Tyler Diaz is independent in bed mobility and gait without device.  He is having some abdominal pain with attempting steps and with increased speed of walking.  I encouraged him to conitinue glute setting and kegels exercise and to keep walking speed under slower and steady with good upper trunk posture.  He will have his son to help him go up the steps to enter home.  Feel he will be OK to walk the halls with family and distant nursing supervision.    PT Assessment  Patent does not need any further PT services    Follow Up Recommendations  No PT follow up    Does the patient have the potential to tolerate intense rehabilitation      Barriers to Discharge        Equipment Recommendations  None recommended by PT    Recommendations for Other Services     Frequency      Precautions / Restrictions     Pertinent Vitals/Pain Pain in abdomen and upper left shoulder/quadrant.  Noted pt with increased belching in upright position. Encouraged pt to move arm and change sitting to supine position every hour or so.      Mobility  Bed Mobility Overal bed mobility: Independent Transfers Overall transfer level: Independent Equipment used: None Ambulation/Gait Ambulation/Gait assistance: Modified independent (Device/Increase time) Ambulation Distance (Feet): 400 Feet Assistive device: None Gait Pattern/deviations: WFL(Within Functional Limits);Trunk flexed Gait velocity: Pt cued to slow pace down at times  Gait velocity interpretation: at or above normal speed for age/gender General Gait Details: Pt tends to keep shoulders forward;cues patient to keep chest higher and he says he normally  does Stairs: Yes Stairs assistance: Min assist Stair Management: One rail Right;Step to pattern Number of Stairs: 2 General stair comments: Pt with increased pain in abdomen with stair negotiation so did not push to do 4 steps Pt able to maintain balance despite pain    Exercises Other Exercises Other Exercises: reinforced glute sets, kegel exercises and lowe abdominal activation   PT Diagnosis:    PT Problem List:   PT Treatment Interventions:       PT Goals(Current goals can be found in the care plan section) Acute Rehab PT Goals PT Goal Formulation: No goals set, d/c therapy  Visit Information  Last PT Received On: 06/29/13 Assistance Needed: +1 History of Present Illness: Pt admitted to have colostomy reversed       Prior [place] expects to be discharged to:: Private residence Living Arrangements: Spouse/significant other Available Help at Discharge: Family Type of Home: House Home Access: Stairs to enter Technical brewer of Steps: 4 Entrance Stairs-Rails: None Home Layout: One level Home Equipment: None Prior Function Level of Independence: Independent Communication Communication: HOH Dominant Hand: Right    Cognition  Cognition Arousal/Alertness: Awake/alert Behavior During Therapy: WFL for tasks assessed/performed Overall Cognitive Status: Within Functional Limits for tasks assessed    Extremity/Trunk Assessment Lower Extremity Assessment Lower Extremity Assessment: Overall WFL for tasks assessed (pt reports numbness and tingling in tips of toes and fimgers from chemo) Cervical / Trunk Assessment Cervical / Trunk Assessment: Kyphotic   Balance Balance Overall balance assessment: Independent Standardized Balance Assessment Standardized Balance Assessment : Dynamic  Gait Index Dynamic Gait Index Level Surface: Normal Change in Gait Speed: Normal Gait with Horizontal Head Turns: Normal Gait with Vertical Head Turns:  Normal Gait and Pivot Turn: Normal Step Over Obstacle: Normal Step Around Obstacles: Normal Steps: Moderate Impairment Total Score: 22 General Comments General comments (skin integrity, edema, etc.): limited on steps due to pain in abdomen  End of Session PT - End of Session Activity Tolerance: Patient tolerated treatment well;Patient limited by pain Patient left: in chair;with family/visitor present;with call bell/phone within reach Nurse Communication: Mobility status  GP    Helene Kelp K. Owens Shark, [place] 06/29/2013, 9:02 AM

## 2013-06-30 MED ORDER — OXYCODONE HCL 5 MG PO TABS
5.0000 mg | ORAL_TABLET | ORAL | Status: DC | PRN
  Administered 2013-06-30 – 2013-07-01 (×7): 10 mg via ORAL
  Filled 2013-06-30 (×7): qty 2

## 2013-06-30 MED ORDER — OXYMETAZOLINE HCL 0.05 % NA SOLN
1.0000 | Freq: Two times a day (BID) | NASAL | Status: DC | PRN
  Filled 2013-06-30: qty 15

## 2013-06-30 NOTE — Progress Notes (Signed)
2 Days Post-Op ileostomy take down Subjective: Did well yesterday.  Pain controlled.  Ambulating.  Had a small BM and passing flatus.   Objective: Vital signs in last 24 hours: Temp:  [98.6 F (37 C)-99.1 F (37.3 C)] 98.7 F (37.1 C) (01/25 0427) Pulse Rate:  [91-98] 98 (01/25 0427) Resp:  [18] 18 (01/25 0427) BP: (132-140)/(63-72) 132/72 mmHg (01/25 0427) SpO2:  [95 %-100 %] 95 % (01/25 0427) Weight:  [184 lb 9.6 oz (83.734 kg)] 184 lb 9.6 oz (83.734 kg) (01/25 0500)   Intake/Output from previous day: 01/24 0701 - 01/25 0700 In: 1685.8 [P.O.:480; I.V.:1205.8] Out: 1550 [Urine:1550] Intake/Output this shift:   General appearance: alert and cooperative GI: normal findings: soft, non-tender  Incision: slight bloody drainage, stable, no erythema   Lab Results:   Recent Labs  06/29/13 0530  WBC 13.1*  HGB 10.2*  HCT 31.1*  PLT 203   BMET  Recent Labs  06/29/13 0530  NA 138  K 4.6  CL 103  CO2 24  GLUCOSE 105*  BUN 17  CREATININE 0.91  CALCIUM 8.8   PT/INR No results found for this basename: LABPROT, INR,  in the last 72 hours ABG No results found for this basename: PHART, PCO2, PO2, HCO3,  in the last 72 hours  MEDS, Scheduled . acetaminophen  1,000 mg Oral TID  . ferrous sulfate  325 mg Oral TID WC  . heparin subcutaneous  5,000 Units Subcutaneous Q8H  . multivitamin with minerals  1 tablet Oral Daily  . saccharomyces boulardii  250 mg Oral BID    Studies/Results: No results found.  Assessment: s/p Procedure(s): loop ILEOSTOMY TAKEDOWN examination of anesthesia  Patient Active Problem List   Diagnosis Date Noted  . Stricture of coloanal anastomosis 04/17/2013  . Ileostomy - diverting loop - s/p takedown 06/28/2013 01/14/2013  . Anemia in neoplastic disease 01/14/2013  . Bilateral inguinal hernia (BIH) 01/03/2013  . Mitral regurgitation due to partially flail posterior mitral valve leaflet 07/30/2012  . Hyperlipidemia   . Rectal cancer -  distal s/p lap LAR/coloanal July 2014 - [id number]. 07/27/2012  . Hypertension   . Vertigo     Doing well post operatively.   Plan: Low residue diet Cont to ambulate Saline lock IV PO narcotics Possible d/c tom   LOS: 2 days     .Rosario Adie, [facility], [place]   06/30/2013 10:46 AM

## 2013-07-01 ENCOUNTER — Encounter (HOSPITAL_COMMUNITY): Payer: Self-pay | Admitting: Surgery

## 2013-07-01 LAB — BASIC METABOLIC PANEL
BUN: 11 mg/dL (ref 6–23)
CHLORIDE: 101 meq/L (ref 96–112)
CO2: 25 meq/L (ref 19–32)
Calcium: 8.9 mg/dL (ref 8.4–10.5)
Creatinine, Ser: 0.95 mg/dL (ref 0.50–1.35)
GFR calc non Af Amer: 83 mL/min — ABNORMAL LOW (ref >90)
GLUCOSE: 108 mg/dL — AB (ref 70–99)
Potassium: 4 mEq/L (ref 3.7–5.3)
Sodium: 138 mEq/L (ref 137–147)

## 2013-07-01 LAB — CBC
HCT: 31.2 % — ABNORMAL LOW (ref 39.0–52.0)
Hemoglobin: 10.5 g/dL — ABNORMAL LOW (ref 13.0–17.0)
MCH: 35.8 pg — ABNORMAL HIGH (ref 26.0–34.0)
MCHC: 33.7 g/dL (ref 30.0–36.0)
MCV: 106.5 fL — ABNORMAL HIGH (ref 78.0–100.0)
PLATELETS: 185 10*3/uL (ref 150–400)
RBC: 2.93 MIL/uL — AB (ref 4.22–5.81)
RDW: 14.5 % (ref 11.5–15.5)
WBC: 6.7 10*3/uL (ref 4.0–10.5)

## 2013-07-01 MED ORDER — FERROUS SULFATE 325 (65 FE) MG PO TABS: 325.0000 mg | ORAL_TABLET | Freq: Two times a day (BID) | ORAL | Status: DC

## 2013-07-01 MED ORDER — ACETAMINOPHEN 500 MG PO TABS: 1000.0000 mg | ORAL_TABLET | Freq: Three times a day (TID) | ORAL | Status: DC

## 2013-07-01 MED ORDER — OXYCODONE HCL 5 MG PO TABS: 5.0000 mg | ORAL_TABLET | ORAL | Status: DC | PRN

## 2013-07-01 NOTE — Discharge Summary (Signed)
Physician Discharge Summary  Patient ID: Tyler Diaz MRN: [id number] DOB/AGE: 07-29-1945 68 y.o.  Admit date: 06/28/2013 Discharge date: 07/01/2013  Admission Diagnoses:  Discharge Diagnoses:  Principal Problem:   Ileostomy - diverting loop - s/p takedown 06/28/2013 Active Problems:   Rectal cancer - distal s/p lap LAR/coloanal July 2014 - [id number].   Hypertension   Vertigo   Anemia in neoplastic disease   Stricture of coloanal anastomosis  Discharged Condition: good  Hospital Course: Pleasant elderly patient with distal rectal cancer requiring resection and diverting loop ileostomy.  Recovered.  He had ileostomy taken down on day of admission.Postoperatively, the patient mobilized in the hallways and advanced to a solid diet gradually.  Pain was well-controlled and transitioned off IV medications.    By the time of discharge, the patient was walking well the hallways, eating food well, having flatus.  He did have some blood in his bowel movements.  Hemoglobin remained stable.  No evidence of hemorrhagic shock or tachycardia.  Pain was-controlled on an oral regimen.  Based on meeting DC criteria and recovering well, I felt it was safe for the patient to be discharged home with close followup.  Instructions were discussed in detail.  They are written as well.  Consults: None  Significant Diagnostic Studies:   Results for orders placed during the hospital encounter of 06/28/13 (from the past 72 hour(s))  BASIC METABOLIC PANEL     Status: Abnormal   Collection Time    06/29/13  5:30 AM      Result Value Range   Sodium 138  137 - 147 mEq/L   Potassium 4.6  3.7 - 5.3 mEq/L   Chloride 103  96 - 112 mEq/L   CO2 24  19 - 32 mEq/L   Glucose, Bld 105 (*) 70 - 99 mg/dL   BUN 17  6 - 23 mg/dL   Creatinine, Ser 6.96  0.50 - 1.35 mg/dL   Calcium 8.8  8.4 - 29.5 mg/dL   GFR calc non Af Amer 84 (*) >90 mL/min   GFR calc Af Amer >90  >90 mL/min   Comment: (NOTE)     The eGFR has been  calculated using the CKD EPI equation.     This calculation has not been validated in all clinical situations.     eGFR's persistently <90 mL/min signify possible Chronic Kidney     Disease.  CBC     Status: Abnormal   Collection Time    06/29/13  5:30 AM      Result Value Range   WBC 13.1 (*) 4.0 - 10.5 K/uL   RBC 2.91 (*) 4.22 - 5.81 MIL/uL   Hemoglobin 10.2 (*) 13.0 - 17.0 g/dL   HCT 28.4 (*) 13.2 - 44.0 %   MCV 106.9 (*) 78.0 - 100.0 fL   MCH 35.1 (*) 26.0 - 34.0 pg   MCHC 32.8  30.0 - 36.0 g/dL   RDW 10.2  72.5 - 36.6 %   Platelets 203  150 - 400 K/uL  MAGNESIUM     Status: None   Collection Time    06/29/13  5:30 AM      Result Value Range   Magnesium 1.7  1.5 - 2.5 mg/dL  CBC     Status: Abnormal   Collection Time    07/01/13  8:00 AM      Result Value Range   WBC 6.7  4.0 - 10.5 K/uL   RBC 2.93 (*) 4.22 - 5.81 MIL/uL  Hemoglobin 10.5 (*) 13.0 - 17.0 g/dL   HCT 40.9 (*) 81.1 - 91.4 %   MCV 106.5 (*) 78.0 - 100.0 fL   MCH 35.8 (*) 26.0 - 34.0 pg   MCHC 33.7  30.0 - 36.0 g/dL   RDW 78.2  95.6 - 21.3 %   Platelets 185  150 - 400 K/uL  BASIC METABOLIC PANEL     Status: Abnormal   Collection Time    07/01/13  8:00 AM      Result Value Range   Sodium 138  137 - 147 mEq/L   Potassium 4.0  3.7 - 5.3 mEq/L   Chloride 101  96 - 112 mEq/L   CO2 25  19 - 32 mEq/L   Glucose, Bld 108 (*) 70 - 99 mg/dL   BUN 11  6 - 23 mg/dL   Creatinine, Ser 0.86  0.50 - 1.35 mg/dL   Calcium 8.9  8.4 - 57.8 mg/dL   GFR calc non Af Amer 83 (*) >90 mL/min   GFR calc Af Amer >90  >90 mL/min   Comment: (NOTE)     The eGFR has been calculated using the CKD EPI equation.     This calculation has not been validated in all clinical situations.     eGFR's persistently <90 mL/min signify possible Chronic Kidney     Disease.    Treatments:   POST-OPERATIVE DIAGNOSIS:  Rectal cancer Status post low anterior resection, coloanal anastomosis, diverting loop ileostomy.  Stricture of coloanal  anastomosis.  PROCEDURE: Procedure(s):  loop ILEOSTOMY TAKEDOWN  Dilation of coloanal anastomosis  Examination of anesthesia  SURGEON: Surgeon(s):  Ardeth Sportsman, MD  Mariella Saa, MD - Asst   Discharge Exam: Blood pressure 134/71, pulse 106, temperature 98.1 F (36.7 C), temperature source Oral, resp. rate 18, height 5\' 10"  (1.778 m), weight 183 lb 1.6 oz (83.054 kg), SpO2 97.00%.  General: Pt awake/alert/oriented x4 in no major acute distress Eyes: PERRL, normal EOM. Sclera nonicteric Neuro: CN II-XII intact w/o focal sensory/motor deficits. Lymph: No head/neck/groin lymphadenopathy Psych:  No delerium/psychosis/paranoia.  Very curious.  Asks many questions.  Mildly pressured speech but correctable.  Inquisitive. HENT: Normocephalic, Mucus membranes moist.  No thrush Neck: Supple, No tracheal deviation Chest: No pain.  Good respiratory excursion. CV:  Pulses intact.  Regular rhythm MS: Normal AROM mjr joints.  No obvious deformity Abdomen: Soft, Nondistended.  Minimal tender in the right lower quadrant.  Ileostomy incision clean.  Dressing & wicks removed. No incarcerated hernias. Ext:  SCDs BLE.  No significant edema.  No cyanosis Skin: No petechiae / purpura   Disposition: 06-Home-Health Care Svc  Discharge Orders   Future Appointments Provider Department Dept Phone   07/22/2013 10:15 AM [facility] [phone]   07/22/2013 10:45 AM Rana Snare, NP [facility] [phone]   Future Orders Complete By Expires   Call MD for:  extreme fatigue  As directed    Call MD for:  hives  As directed    Call MD for:  persistant nausea and vomiting  As directed    Call MD for:  redness, tenderness, or signs of infection (pain, swelling, redness, odor or green/yellow discharge around incision site)  As directed    Call MD for:  severe uncontrolled pain  As directed    Call MD for:  As directed     Comments:     Temperature > 101.46F  Diet - low sodium heart healthy  As directed    Discharge instructions  As directed    Comments:     Please see discharge instruction sheets.  Also refer to handout given an office.  Please call our office if you have any questions or concerns [phone]   Discharge wound care:  As directed    Comments:     If you have closed incisions, shower and bathe over these incisions with soap and water every day.  Remove all surgical dressings on postoperative day #3.  You do not need to replace dressings over the closed incisions unless you feel more comfortable with a Band-Aid covering it.   If you have an open wound that requires packing, please see wound care instructions.  In general, remove all dressings, wash wound with soap and water and then replace with saline moistened gauze.  Do the dressing change at least every day.  Please call our office [phone] if you have further questions.   Driving Restrictions  As directed    Comments:     No driving until off narcotics and can safely swerve away without pain during an emergency   Increase activity slowly  As directed    Comments:     Walk an hour a day.  Use 20-30 minute walks.  When you can walk 30 minutes without difficulty, increase to low impact/moderate activities such as biking, jogging, swimming, sexual activity..  Eventually can increase to unrestricted activity when not feeling pain.  If you feel pain: STOP!Marland Kitchen   Let pain protect you from overdoing it.  Use ice/heat/over-the-counter pain medications to help minimize his soreness.  Use pain prescriptions as needed to remain active.  It is better to take extra pain medications and be more active than to stay bedridden to avoid all pain medications.   Lifting restrictions  As directed    Comments:     Avoid heavy lifting initially.  Do not push through pain.  You have no specific weight limit.  Coughing and sneezing or four more stressful to your  incision than any lifting you will do. Pain will protect you from injury.  Therefore, avoid intense activity until off all narcotic pain medications.  Coughing and sneezing or four more stressful to your incision than any lifting he will do.   May shower / Bathe  As directed    May walk up steps  As directed    Sexual Activity Restrictions  As directed    Comments:     Sexual activity as tolerated.  Do not push through pain.  Pain will protect you from injury.   Walk with assistance  As directed    Comments:     Walk over an hour a day.  May use a walker/cane/companion to help with balance and stamina.       Medication List         acetaminophen 500 MG tablet  Commonly known as:  TYLENOL  Take 500 mg by mouth every 6 (six) hours as needed for mild pain or moderate pain.     acetaminophen-codeine 300-30 MG per tablet  Commonly known as:  TYLENOL #3  Take 1-2 tablets by mouth every 6 (six) hours as needed for moderate pain or severe pain.     ALPRAZolam 0.5 MG tablet  Commonly known as:  XANAX  Take 0.5 mg by mouth at bedtime as needed for anxiety.     diazepam 2 MG tablet  Commonly known  as:  VALIUM  Take 2 mg by mouth at bedtime as needed for anxiety.     diphenoxylate-atropine 2.5-0.025 MG per tablet  Commonly known as:  LOMOTIL  Take by mouth 4 (four) times daily as needed for diarrhea or loose stools.     ferrous sulfate 325 (65 FE) MG tablet  Take 1 tablet (325 mg total) by mouth 2 (two) times daily with a meal.     hydrocortisone 2.5 % rectal cream  Commonly known as:  ANUSOL-HC  Place 1 application rectally 2 (two) times daily as needed for hemorrhoids.     loperamide 2 MG tablet  Commonly known as:  IMODIUM A-D  Take 2 mg by mouth 3 (three) times daily as needed for diarrhea or loose stools.     multivitamin with minerals Tabs tablet  Take 1 tablet by mouth daily.     oxyCODONE 5 MG immediate release tablet  Commonly known as:  Oxy IR/ROXICODONE  Take 1-2  tablets (5-10 mg total) by mouth every 4 (four) hours as needed for moderate pain, severe pain or breakthrough pain.     promethazine 25 MG tablet  Commonly known as:  PHENERGAN  Take 25 mg by mouth every 6 (six) hours as needed for nausea.           Follow-up Information   Follow up with Jacole Capley C., MD. Schedule an appointment as soon as possible for a visit in 2 weeks.   Specialty:  General Surgery   Contact information:   [address] [place] [address] [phone]       Signed: Ardeth Sportsman. 07/01/2013, 1:53 PM

## 2013-07-01 NOTE — Progress Notes (Signed)
Pt requested RN to see BM from this AM.  Pt stated that he had seen a small amount of blood in stool, but 'not this much.' Bowl did have significant amount of blood.  Bowl water was a dark red.  Will continue to monitor.  Roselind Rily

## 2013-07-01 NOTE — Discharge Instructions (Signed)
Managing Pain  Pain after surgery or related to activity is often due to strain/injury to muscle, tendon, nerves and/or incisions.  This pain is usually short-term and will improve in a few months.   Many people find it helpful to do the following things TOGETHER to help speed the process of healing and to get back to regular activity more quickly:  1. Avoid heavy physical activity a.  no lifting greater than 20 pounds b. Do not push through the pain.  Listen to your body and avoid positions and maneuvers than reproduce the pain c. Walking is okay as tolerated, but go slowly and stop when getting sore.  d. Remember: If it hurts to do it, then dont do it! 2. Take Anti-inflammatory medication  a. Take with food/snack around the clock for 1-2 weeks i. This helps the muscle and nerve tissues become less irritable and calm down faster ii. Choose Acetaminophen 500mg  tabs (Tylenol) 2 pills with every meal and just before bedtime 3. Use a Heating pad or Ice/Cold Pack a. 4-6 times a day b. May use warm bath/hottub  or showers 4. Try Gentle Massage and/or Stretching  a. at the area of pain many times a day b. stop if you feel pain - do not overdo it  Try these steps together to help you body heal faster and avoid making things get worse.  Doing just one of these things may not be enough.    If you are not getting better after two weeks or are noticing you are getting worse, contact our office for further advice; we may need to re-evaluate you & see what other things we can do to help.    ABDOMINAL SURGERY: POST OP INSTRUCTIONS  1. DIET: Follow a light bland diet the first 24 hours after arrival home, such as soup, liquids, crackers, etc.  Be sure to include lots of fluids daily.  Avoid fast food or heavy meals as your are more likely to get nauseated.  Eat a low fat the next few days after surgery.   2. Take your usually prescribed home medications unless otherwise directed. 3. PAIN  CONTROL: a. Pain is best controlled by a usual combination of three different methods TOGETHER: i. Ice/Heat ii. Over the counter pain medication iii. Prescription pain medication b. Most patients will experience some swelling and bruising around the incisions.  Ice packs or heating pads (30-60 minutes up to 6 times a day) will help. Use ice for the first few days to help decrease swelling and bruising, then switch to heat to help relax tight/sore spots and speed recovery.  Some people prefer to use ice alone, heat alone, alternating between ice & heat.  Experiment to what works for you.  Swelling and bruising can take several weeks to resolve.   c. It is helpful to take an over-the-counter pain medication regularly for the first few weeks.  Choose one of the following that works best for you: i. Naproxen (Aleve, etc)  Two 220mg  tabs twice a day ii. Ibuprofen (Advil, etc) Three 200mg  tabs four times a day (every meal & bedtime) iii. Acetaminophen (Tylenol, etc) 500-650mg  four times a day (every meal & bedtime) d. A  prescription for pain medication (such as oxycodone, hydrocodone, etc) should be given to you upon discharge.  Take your pain medication as prescribed.  i. If you are having problems/concerns with the prescription medicine (does not control pain, nausea, vomiting, rash, itching, etc), please call us [phone] to see  if we need to switch you to a different pain medicine that will work better for you and/or control your side effect better. ii. If you need a refill on your pain medication, please contact your pharmacy.  They will contact our office to request authorization. Prescriptions will not be filled after 5 pm or on week-ends. 4. Avoid getting constipated.  Between the surgery and the pain medications, it is common to experience some constipation.  Increasing fluid intake and taking a fiber supplement (such as Metamucil, Citrucel, FiberCon, MiraLax, etc) 1-2 times a day regularly  will usually help prevent this problem from occurring.  A mild laxative (prune juice, Milk of Magnesia, MiraLax, etc) should be taken according to package directions if there are no bowel movements after 48 hours.   5. Watch out for diarrhea.  If you have many loose bowel movements, simplify your diet to bland foods & liquids for a few days.  Stop any stool softeners and decrease your fiber supplement.  Switching to mild anti-diarrheal medications (Kayopectate, Pepto Bismol) can help.  If this worsens or does not improve, please call us. 6. Wash / shower every day.  You may shower over the incision / wound.  Avoid baths until the skin is fully healed.  Continue to shower over incision(s) after the dressing is off. 7. Remove your waterproof bandages 5 days after surgery.  You may leave the incision open to air.  You may replace a dressing/Band-Aid to cover the incision for comfort if you wish. 8. ACTIVITIES as tolerated:   a. You may resume regular (light) daily activities beginning the next day--such as daily self-care, walking, climbing stairs--gradually increasing activities as tolerated.  If you can walk 30 minutes without difficulty, it is safe to try more intense activity such as jogging, treadmill, bicycling, low-impact aerobics, swimming, etc. b. Save the most intensive and strenuous activity for last such as sit-ups, heavy lifting, contact sports, etc  Refrain from any heavy lifting or straining until you are off narcotics for pain control.   c. DO NOT PUSH THROUGH PAIN.  Let pain be your guide: If it hurts to do something, don't do it.  Pain is your body warning you to avoid that activity for another week until the pain goes down. d. You may drive when you are no longer taking prescription pain medication, you can comfortably wear a seatbelt, and you can safely maneuver your car and apply brakes. e. Dennis Bast may have sexual intercourse when it is comfortable.  9. FOLLOW UP in our office a. Please call  [facility] at [phone] to set up an appointment to see your surgeon in the office for a follow-up appointment approximately 1-2 weeks after your surgery. b. Make sure that you call for this appointment the day you arrive home to insure a convenient appointment time. 10. IF YOU HAVE DISABILITY OR FAMILY LEAVE FORMS, BRING THEM TO THE OFFICE FOR PROCESSING.  DO NOT GIVE THEM TO YOUR DOCTOR.   WHEN TO CALL us [phone]: 1. Poor pain control 2. Reactions / problems with new medications (rash/itching, nausea, etc)  3. Fever over 101.5 F (38.5 C) 4. Inability to urinate 5. Nausea and/or vomiting 6. Worsening swelling or bruising 7. Continued bleeding from incision. 8. Increased pain, redness, or drainage from the incision  The clinic staff is available to answer your questions during regular business hours (8:30am-5pm).  Please dont hesitate to call and ask to speak to one of our nurses for clinical concerns.  A surgeon from [facility] is always on call at the hospitals   If you have a medical emergency, go to the nearest emergency room or call 911.    [facility], [address], [address], [place], [place]  [address] ? MAIN: [phone] ? TOLL FREE: [phone] ? FAX [phone] [email or web address]  Pelvic floor muscle training exercises  can help strengthen the muscles under the uterus, bladder, and bowel (large intestine). They can help both men and women who have problems with urine leakage or bowel control.  A pelvic floor muscle training exercise is like pretending that you have to urinate, and then holding it. You relax and tighten the muscles that control urine flow. It's important to find the right muscles to tighten.  The next time you have to urinate, start to go and then stop. Feel the muscles in your vagina, bladder, or anus get tight and move up. These are the pelvic floor muscles. If you feel them tighten, you've  done the exercise right. If you are still not sure whether you are tightening the right muscles, keep in mind that all of the muscles of the pelvic floor relax and contract at the same time. Because these muscles control the bladder, rectum, and vagina, the following tips may help: Women: Insert a finger into your vagina. Tighten the muscles as if you are holding in your urine, then let go. You should feel the muscles tighten and move up and down.  Men: Insert a finger into your rectum. Tighten the muscles as if you are holding in your urine, then let go. You should feel the muscles tighten and move up and down. These are the same muscles you would tighten if you were trying to prevent yourself from passing gas.  It is very important that you keep the following muscles relaxed while doing pelvic floor muscle training exercises: Abdominal  Buttocks (the deeper, anal sphincter muscle should contract)  Thigh   A woman can also strengthen these muscles by using a vaginal cone, which is a weighted device that is inserted into the vagina. Then you try to tighten the pelvic floor muscles to hold the device in place. If you are unsure whether you are doing the pelvic floor muscle training correctly, you can use biofeedback and electrical stimulation to help find the correct muscle group to work. Biofeedback is a method of positive reinforcement. Electrodes are placed on the abdomen and along the anal area. Some therapists place a sensor in the vagina in women or anus in men to monitor the contraction of pelvic floor muscles.  A monitor will display a graph showing which muscles are contracting and which are at rest. The therapist can help find the right muscles for performing pelvic floor muscle training exercises.   PERFORMING PELVIC FLOOR EXERCISES: 1. Begin by emptying your bladder. 2. Tighten the pelvic floor muscles and hold for a count of 10. 3. Relax the muscles completely for a count of 10. 4. Do  10 repititions, 3 to 5 times a day (morning, afternoon, and night). You can do these exercises at any time and any place. Most people prefer to do the exercises while lying down or sitting in a chair. After 4 - 6 weeks, most people notice some improvement. It may take as long as 3 months to see a major change. After a couple of weeks, you can also try doing a single pelvic floor contraction at times when you are  likely to leak (for example, while getting out of a chair). A word of caution: Some people feel that they can speed up the progress by increasing the number of repetitions and the frequency of exercises. However, over-exercising can instead cause muscle fatigue and increase urine leakage. If you feel any discomfort in your abdomen or back while doing these exercises, you are probably doing them wrong. Breathe deeply and relax your body when you are doing these exercises. Make sure you are not tightening your stomach, thigh, buttock, or chest muscles. When done the right way, pelvic floor muscle exercises have been shown to be very effective at improving urinary continence. Alternative Names Kegel exercises  GETTING TO [place]. Irregular bowel habits such as constipation and diarrhea can lead to many problems over time.  Having one soft bowel movement a day is the most important way to prevent further problems.  The anorectal canal is designed to handle stretching and feces to safely manage our ability to get rid of solid waste (feces, poop, stool) out of our body.  BUT, hard constipated stools can act like ripping concrete bricks and diarrhea can be a burning fire to this very sensitive area of our body, causing inflamed hemorrhoids, anal fissures, increasing risk is perirectal abscesses, abdominal pain/bloating, an making irritable bowel worse.     The goal: ONE SOFT BOWEL MOVEMENT A DAY!  To have soft, regular bowel movements:    Drink at least 8 tall glasses of water a day.     Take  plenty of fiber.  Fiber is the undigested part of plant food that passes into the colon, acting s natures broom to encourage bowel motility and movement.  Fiber can absorb and hold large amounts of water. This results in a larger, bulkier stool, which is soft and easier to pass. Work gradually over several weeks up to 6 servings a day of fiber (25g a day even more if needed) in the form of: o Vegetables -- Root (potatoes, carrots, turnips), leafy green (lettuce, salad greens, celery, spinach), or cooked high residue (cabbage, broccoli, etc) o Fruit -- Fresh (unpeeled skin & pulp), Dried (prunes, apricots, cherries, etc ),  or stewed ( applesauce)  o Whole grain breads, pasta, etc (whole wheat)  o Bran cereals    Bulking Agents -- This type of water-retaining fiber generally is easily obtained each day by one of the following:  o Psyllium bran -- The psyllium plant is remarkable because its ground seeds can retain so much water. This product is available as Metamucil, Konsyl, Effersyllium, Per Diem Fiber, or the less expensive generic preparation in drug and health food stores. Although labeled a laxative, it really is not a laxative.  o Methylcellulose -- This is another fiber derived from wood which also retains water. It is available as Citrucel. o Polyethylene Glycol - and artificial fiber commonly called Miralax or Glycolax.  It is helpful for people with gassy or bloated feelings with regular fiber o Flax Seed - a less gassy fiber than psyllium   No reading or other relaxing activity while on the toilet. If bowel movements take longer than 5 minutes, you are too constipated   AVOID CONSTIPATION.  High fiber and water intake usually takes care of this.  Sometimes a laxative is needed to stimulate more frequent bowel movements, but    Laxatives are not a good long-term solution as it can wear the colon out. o Osmotics (Milk of Magnesia, Fleets phosphosoda, Magnesium citrate,  MiraLax, GoLytely)  are safer than  o Stimulants (Senokot, Castor Oil, Dulcolax, Ex Lax)    o Do not take laxatives for more than 7days in a row.    IF SEVERELY CONSTIPATED, try a Bowel Retraining Program: o Do not use laxatives.  o Eat a diet high in roughage, such as bran cereals and leafy vegetables.  o Drink six (6) ounces of prune or apricot juice each morning.  o Eat two (2) large servings of stewed fruit each day.  o Take one (1) heaping tablespoon of a psyllium-based bulking agent twice a day. Use sugar-free sweetener when possible to avoid excessive calories.  o Eat a normal breakfast.  o Set aside 15 minutes after breakfast to sit on the toilet, but do not strain to have a bowel movement.  o If you do not have a bowel movement by the third day, use an enema and repeat the above steps.    Controlling diarrhea o Switch to liquids and simpler foods for a few days to avoid stressing your intestines further. o Avoid dairy products (especially milk & ice cream) for a short time.  The intestines often can lose the ability to digest lactose when stressed. o Avoid foods that cause gassiness or bloating.  Typical foods include beans and other legumes, cabbage, broccoli, and dairy foods.  Every person has some sensitivity to other foods, so listen to our body and avoid those foods that trigger problems for you. o Adding fiber (Citrucel, Metamucil, psyllium, Miralax) gradually can help thicken stools by absorbing excess fluid and retrain the intestines to act more normally.  Slowly increase the dose over a few weeks.  Too much fiber too soon can backfire and cause cramping & bloating. o Probiotics (such as active yogurt, Align, etc) may help repopulate the intestines and colon with normal bacteria and calm down a sensitive digestive tract.  Most studies show it to be of mild help, though, and such products can be costly. o Medicines:   Bismuth subsalicylate (ex. Kayopectate, Pepto Bismol) every 30 minutes for up to 6  doses can help control diarrhea.  Avoid if pregnant.   Loperamide (Immodium) can slow down diarrhea.  Start with two tablets (4mg  total) first and then try one tablet every 6 hours.  Avoid if you are having fevers or severe pain.  If you are not better or start feeling worse, stop all medicines and call your doctor for advice o Call your doctor if you are getting worse or not better.  Sometimes further testing (cultures, endoscopy, X-ray studies, bloodwork, etc) may be needed to help diagnose and treat the cause of the diarrhea.  Colorectal Cancer Colorectal cancer is an abnormal growth of tissue (tumor) in the colon or rectum that is cancerous (malignant). Unlike noncancerous (benign) tumors, malignant tumors can spread to other parts of your body. The colon is the large bowel or large intestine. The rectum is the last several inches of the colon.  RISK FACTORS The exact cause of colorectal cancer is unknown. However, the following factors may increase your chances of getting colorectal cancer:   Age older than 96 years.   Abnormal growths (polyps) on the inner wall of the colon or rectum.   Diabetes.   African American race.   Family history of hereditary nonpolyposis colorectal cancer. This condition is caused by changes in the genes that are responsible for repairing mismatched DNA.   Personal history of cancer. A person who has already had colorectal cancer  may develop it a second time. Also, women with a history of ovarian, uterine, or breast cancer are at a somewhat higher risk of developing colorectal cancer.  Certain hereditary conditions.  Eating a diet that is high in fat (especially animal fat) and low in fiber, fruits, and vegetables.  Sedentary lifestyle.  Inflammatory bowel disease, including ulcerative colitis and Crohn disease.   Smoking.   Excessive alcohol use.  SYMPTOMS Early colorectal cancer often does not cause symptoms. As the cancer grows, symptoms  may include:   Changes in bowel habits.  Diarrhea.   Constipation.   Feeling like the bowel does not empty completely after a bowel movement.   Blood in the stool.   Stools that are narrower than usual.   Abdominal discomfort, pain, bloating, fullness, or cramps.  Frequent gas pain.   Unexplained weight loss.   Constant tiredness.   Nausea and vomiting.  DIAGNOSIS  Your health care provider will ask about your medical history. He or she may also perform a number of procedures, such as:   A physical exam.  A digital rectal exam.  A fecal occult blood test.  A barium enema.  Blood tests.   X-rays.   Imaging tests, such as CT scans or MRIs.   Taking a tissue sample (biopsy) from your colon or rectum to look for cancer cells.   A sigmoidoscopy to view the inside of the last part of your colon.   A colonoscopy to view the inside of your entire colon.   An endorectal ultrasound to see how deep a rectal tumor has grown and whether the cancer has spread to lymph nodes or other nearby tissues.  Your cancer will be staged to determine its severity and extent. Staging is a careful attempt to find out the size of the tumor, whether the cancer has spread, and if so, to what parts of the body. You may need to have more tests to determine the stage of your cancer. The test results will help determine what treatment plan is best for you.   Stage 0 The cancer is found only in the innermost lining of the colon or rectum.   Stage I The cancer has grown into the inner wall of the colon or rectum. The cancer has not yet reached the outer wall of the colon.   Stage II The cancer extends more deeply into or through the wall of the colon or rectum. It may have invaded nearby tissue, but cancer cells have not spread to the lymph nodes.   Stage III The cancer has spread to nearby lymph nodes but not to other parts of the body.   Stage IV The cancer has spread to  other parts of the body, such as the liver or lungs.  Your health care provider may tell you the detailed stage of your cancer, which includes both a number and a letter.  TREATMENT  Depending on the type and stage, colorectal cancer may be treated with surgery, radiation therapy, chemotherapy, targeted therapy, or radiofrequency ablation. Some people have a combination of these therapies. Surgery may be done to remove the polyps from your colon. In early stages, your health care provider may be able to do this during a colonoscopy. In later stages, surgery may be done to remove part of your colon.  HOME CARE INSTRUCTIONS   Only take over-the-counter or prescription medicines for pain, discomfort, or fever as directed by your health care provider.   Maintain a healthy diet.  Consider joining a support group. This may help you learn to cope with the stress of having colorectal cancer.   Seek advice to help you manage treatment of side effects.   Keep all follow-up appointments as directed by your health care provider.   Inform your cancer specialist if you are admitted to the hospital.  SEEK MEDICAL CARE IF:  Your diarrhea or constipation does not go away.   Your bowel habits change.  You have increased abdominal pain.   You notice new fatigue or weakness.  You lose weight. Document Released: 05/23/2005 Document Revised: 01/23/2013 Document Reviewed: 11/15/2012 [facility] Patient Information 2014 [place].   Loop Ileostomy Reversal A loop ileostomy reversal is surgery that reverses a temporary loop ileostomy. The small intestine is disconnected from the opening in the abdomen (stoma). It is then reconnected inside the body. A stoma and bag are no longer needed. Bowel movements can resume through the rectum. A reversal may be done after another part of your bowel has had time to heal and your caregiver feels that you are healthy enough to have surgery. LET YOUR  CAREGIVER KNOW ABOUT:  Allergies to food or medicine.  Medicines taken, including vitamins, health supplements, herbs, eyedrops, over-the-counter medicines, and creams.  Use of steroids (by mouth or creams).  Previous problems with anesthetics or numbing medicines.  History of bleeding problems or blood clots.  Previous surgery.  Other health problems, including diabetes and kidney problems.  Possibility of pregnancy, if this applies. RISKS AND COMPLICATIONS  Reaction to anesthesia.  Infection inside the abdominal cavity (abscess).  Wound infection where the stoma was.  Blood clot.  Bleeding.  Scarring.  Slow recovery of intestinal function (ileus).  Intestinal blockage.  Intestinal leakage.  Narrowing of the joined area. BEFORE THE PROCEDURE It is important to follow your caregiver's instructions prior to your procedure. This helps to avoid complications. Steps before your procedure may include:  A physical exam, rectal exam, X-rays, and other procedures.  Chemotherapy or radiation therapy if your ileostomy was done as part of a cancer surgery.  A review of the procedure, the anesthesia being used, and what to expect after the procedure. You may be asked to:  Stop taking certain medicines for several days prior to your procedure. This may include blood thinners (such as aspirin).  Take certain medicines, such as stool softeners.  Follow a special diet for several days prior to the procedure.  Avoid eating and drinking after midnight the night before the procedure. This will help you to avoid complications from the anesthesia.  Quit smoking. Smoking increases the chances of a healing problem after your procedure. PROCEDURE You will be given medicine that makes you sleep (general anesthetic).The surgeon will make a small cut (incision) around the stoma. The surgeon will then stitch or staple the 2 ends of the intestine back together, leaving the joined  intestine inside the abdominal cavity. AFTER THE PROCEDURE  You will be given pain medicine.  Your caregivers will slowly increase your diet and movement.  You can expect to be in the hospital for about 3 to 5 days.  You should arrange for someone to help you with activities at home while you recover. Document Released: 05/12/2011 Document Revised: 08/15/2011 Document Reviewed: 05/12/2011 [facility] Patient Information 2014 [place], [place].  Iron Deficiency Anemia, Adult Anemia is a condition in which there are less red blood cells or hemoglobin in the blood than normal. Hemoglobin is this part of red blood cells that carries  oxygen. Iron deficiency anemia is anemia caused by too little iron. It is the most common type of anemia. It may leave you tired and short of breath. CAUSES   Lack of iron in the diet.  Poor absorption of iron, as seen with intestinal disorders.  Intestinal bleeding.  Heavy periods. SIGNS AND SYMPTOMS  Mild anemia may not be noticeable. Symptoms may include:  Fatigue.  Headache.  Pale skin.  Weakness.  Tiredness.  Shortness of breath.  Dizziness.  Cold hands and feet.  Fast or irregular heartbeat. DIAGNOSIS  Diagnosis requires a thorough evaluation and physical exam by your health care provider. Blood tests are generally used to confirm iron deficiency anemia. Additional tests may be done to find the underlying cause of your anemia. These may include:  Testing for blood in the stool (fecal occult blood test).  A procedure to see inside the colon and rectum (colonoscopy).  A procedure to see inside the esophagus and stomach (endoscopy). TREATMENT  Iron deficiency anemia is treated by correcting the cause of the deficiency. Treatment may involve:  Adding iron-rich foods to your diet.  Taking iron supplements. Pregnant or breastfeeding women need to take extra iron, because their normal diet usually does not provide the required  amount.  Taking vitamins. Vitamin C improves the absorption of iron. Your health care provider may recommend taking your iron tablets with a glass of orange juice or vitamin C supplement.  Medicines to make heavy menstrual flow lighter.  Surgery. HOME CARE INSTRUCTIONS   Take iron as directed by your health care provider.  If you cannot tolerate taking iron supplements by mouth, talk to your health care provider about taking them through a vein (intravenously) or an injection into a muscle.  For the best iron absorption, iron supplements should be taken on an empty stomach. If you cannot tolerate them on an empty stomach, you may need to take them with food.  Do not drink milk or take antacids at the same time as your iron supplements. Milk and antacids may interfere with the absorption of iron.  Iron supplements can cause constipation. Make sure to include fiber in your diet to prevent constipation. A stool softener may also be recommended.  Take vitamins as directed by your health care provider.  Eat a diet rich in iron. Foods high in iron include liver, lean beef, whole-grain bread, eggs, dried fruit, and dark green, leafy vegetables. SEEK IMMEDIATE MEDICAL CARE IF:   You faint. If this happens, do not drive. Call your local emergency services (911 in [place] if no other help is available.  You have chest pain.  You feel nauseous or vomit.  You have severe or increased shortness of breath with activity.  You feel weak.  You have a rapid heartbeat.  You have unexplained sweating.  You become lightheaded when getting up from a chair or bed. MAKE SURE YOU:   Understand these instructions.  Will watch your condition.  Will get help right away if you are not doing well or get worse. Document Released: 05/20/2000 Document Revised: 03/13/2013 Document Reviewed: 01/28/2013 [facility] Patient Information 2014 [place].

## 2013-07-05 ENCOUNTER — Emergency Department (HOSPITAL_COMMUNITY)
Admission: EM | Admit: 2013-07-05 | Discharge: 2013-07-06 | Disposition: A | Discharge status: Home / Self Care | Payer: Medicare Other | Source: Home / Self Care | Attending: Emergency Medicine | Admitting: Emergency Medicine

## 2013-07-05 ENCOUNTER — Emergency Department (HOSPITAL_COMMUNITY): Payer: Medicare Other

## 2013-07-05 ENCOUNTER — Encounter (HOSPITAL_COMMUNITY): Payer: Self-pay | Admitting: Emergency Medicine

## 2013-07-05 DIAGNOSIS — R509 Fever, unspecified: Secondary | ICD-10-CM

## 2013-07-05 DIAGNOSIS — B349 Viral infection, unspecified: Secondary | ICD-10-CM

## 2013-07-05 LAB — CBC WITH DIFFERENTIAL/PLATELET
Basophils Absolute: 0 10*3/uL (ref 0.0–0.1)
Basophils Relative: 0 % (ref 0–1)
EOS ABS: 0.1 10*3/uL (ref 0.0–0.7)
Eosinophils Relative: 1 % (ref 0–5)
HCT: 29.1 % — ABNORMAL LOW (ref 39.0–52.0)
Hemoglobin: 9.9 g/dL — ABNORMAL LOW (ref 13.0–17.0)
LYMPHS PCT: 4 % — AB (ref 12–46)
Lymphs Abs: 0.4 10*3/uL — ABNORMAL LOW (ref 0.7–4.0)
MCH: 36.1 pg — ABNORMAL HIGH (ref 26.0–34.0)
MCHC: 34 g/dL (ref 30.0–36.0)
MCV: 106.2 fL — ABNORMAL HIGH (ref 78.0–100.0)
Monocytes Absolute: 0.9 10*3/uL (ref 0.1–1.0)
Monocytes Relative: 8 % (ref 3–12)
Neutro Abs: 9.7 10*3/uL — ABNORMAL HIGH (ref 1.7–7.7)
Neutrophils Relative %: 88 % — ABNORMAL HIGH (ref 43–77)
PLATELETS: 229 10*3/uL (ref 150–400)
RBC: 2.74 MIL/uL — ABNORMAL LOW (ref 4.22–5.81)
RDW: 14.4 % (ref 11.5–15.5)
WBC: 11 10*3/uL — AB (ref 4.0–10.5)

## 2013-07-05 LAB — COMPREHENSIVE METABOLIC PANEL
ALT: 31 U/L (ref 0–53)
AST: 25 U/L (ref 0–37)
Albumin: 3.4 g/dL — ABNORMAL LOW (ref 3.5–5.2)
Alkaline Phosphatase: 97 U/L (ref 39–117)
BILIRUBIN TOTAL: 0.4 mg/dL (ref 0.3–1.2)
BUN: 15 mg/dL (ref 6–23)
CO2: 27 meq/L (ref 19–32)
Calcium: 8.9 mg/dL (ref 8.4–10.5)
Chloride: 100 mEq/L (ref 96–112)
Creatinine, Ser: 1.13 mg/dL (ref 0.50–1.35)
GFR calc Af Amer: 75 mL/min — ABNORMAL LOW (ref >90)
GFR calc non Af Amer: 64 mL/min — ABNORMAL LOW (ref >90)
GLUCOSE: 116 mg/dL — AB (ref 70–99)
POTASSIUM: 4.9 meq/L (ref 3.7–5.3)
Sodium: 138 mEq/L (ref 137–147)
Total Protein: 6.6 g/dL (ref 6.0–8.3)

## 2013-07-05 LAB — URINALYSIS, ROUTINE W REFLEX MICROSCOPIC
Bilirubin Urine: NEGATIVE
Glucose, UA: NEGATIVE mg/dL
Hgb urine dipstick: NEGATIVE
Ketones, ur: NEGATIVE mg/dL
LEUKOCYTES UA: NEGATIVE
NITRITE: NEGATIVE
PH: 8 (ref 5.0–8.0)
Protein, ur: NEGATIVE mg/dL
SPECIFIC GRAVITY, URINE: 1.016 (ref 1.005–1.030)
UROBILINOGEN UA: 0.2 mg/dL (ref 0.0–1.0)

## 2013-07-05 MED ORDER — SODIUM CHLORIDE 0.9 % IV BOLUS (SEPSIS)
1000.0000 mL | Freq: Once | INTRAVENOUS | Status: AC
  Administered 2013-07-05: 1000 mL via INTRAVENOUS

## 2013-07-05 MED ORDER — ACETAMINOPHEN 500 MG PO TABS
1000.0000 mg | ORAL_TABLET | Freq: Once | ORAL | Status: AC
  Administered 2013-07-05: 1000 mg via ORAL
  Filled 2013-07-05: qty 2

## 2013-07-05 MED ORDER — KETOROLAC TROMETHAMINE 15 MG/ML IJ SOLN
15.0000 mg | Freq: Once | INTRAMUSCULAR | Status: AC
  Administered 2013-07-05: 15 mg via INTRAVENOUS
  Filled 2013-07-05: qty 1

## 2013-07-05 MED ORDER — OSELTAMIVIR PHOSPHATE 75 MG PO CAPS: 75.0000 mg | ORAL_CAPSULE | Freq: Two times a day (BID) | ORAL | Status: DC

## 2013-07-05 MED ORDER — KETOROLAC TROMETHAMINE 30 MG/ML IJ SOLN: 30.0000 mg | Freq: Once | INTRAMUSCULAR | Status: DC

## 2013-07-05 NOTE — ED Notes (Signed)
Bed: HL45 Expected date:  Expected time:  Means of arrival:  Comments: EMS/male with 107 temp/pt of Dr. Teodora Medici ileostomy reversal

## 2013-07-05 NOTE — ED Provider Notes (Addendum)
TIME SEEN: 9:43 PM  CHIEF COMPLAINT: Fever  HPI: Patient is a 68 year old male with a history of rectal cancer status post resection in November 2014, chemotherapy which finished in November 2014 and radiation, who recently underwent ileostomy takedown on 06/28/13 by Dr. gross who presents emergency department with fever that started tonight. Patient reports that he is having some body aches. He has a very mild frontal, throbbing headache. No neck pain or neck stiffness. No chest pain or shortness of breath. No cough. He reports his abdominal pain since surgery has been improving. No nausea, vomiting or diarrhea. No dysuria or hematuria. Denies any skin rash. No known sick contacts. He states he did have an influenza and pneumococcal vaccination in 2014.  ROS: See HPI Constitutional:  fever  Eyes: no drainage  ENT: no runny nose   Cardiovascular:  no chest pain  Resp: no SOB  GI: no vomiting GU: no dysuria Integumentary: no rash  Allergy: no hives  Musculoskeletal: no leg swelling  Neurological: no slurred speech ROS otherwise negative  PAST MEDICAL HISTORY/PAST SURGICAL HISTORY:  Past Medical History  Diagnosis Date  . Mitral regurgitation due to partially flail posterior mitral valve leaflet   . Rectal cancer   . Hyperlipidemia   . Colon cancer 07/18/12 bx    rectum=Invasive adenocarcinoma w/ extracellular mucin,polyp=benign  . Rheumatic fever as child    hx  . Hearing loss     partial greater on left  . Tinnitus     left ear  . Sinusitis     seasonal  . Arthritis     hands/knees  . Hematochezia     recent  . Heart murmur   . Hypothyroidism as child    hx of  . Numbness     TOES / FINGERS DUE TO CHEMO  . History of shingles   . Blood clot in vein     RT ARM WITH PICC LINE   . GERD (gastroesophageal reflux disease)     OCCASIONAL  . Nocturia   . Radiation     FOR COLON CANCER  . Umbilical hernia, incarcerated - s/p primary repair July 2014 01/03/2013  . Herpes  zoster - R flank - with severe pain 03/11/2013    MEDICATIONS:  Prior to Admission medications   Medication Sig Start Date End Date Taking? Authorizing Provider  acetaminophen (TYLENOL) 500 MG tablet Take 2 tablets (1,000 mg total) by mouth 4 (four) times daily -  with meals and at bedtime. 07/01/13   Adin Hector, MD  acetaminophen-codeine (TYLENOL #3) 300-30 MG per tablet Take 1-2 tablets by mouth every 4 (four) hours as needed. 06/26/13   Historical Provider, MD  ALPRAZolam Duanne Moron) 0.5 MG tablet Take 0.5 mg by mouth at bedtime as needed for anxiety.    Historical Provider, MD  diazepam (VALIUM) 2 MG tablet Take 2 mg by mouth at bedtime as needed for anxiety. 05/21/13   [name], MD  diphenoxylate-atropine (LOMOTIL) 2.5-0.025 MG per tablet Take by mouth 4 (four) times daily as needed for diarrhea or loose stools.    Historical Provider, MD  ferrous sulfate 325 (65 FE) MG tablet Take 1 tablet (325 mg total) by mouth 2 (two) times daily with a meal. 07/01/13   Adin Hector, MD  hydrocortisone (ANUSOL-HC) 2.5 % rectal cream Place 1 application rectally 2 (two) times daily as needed for hemorrhoids. 01/14/13   Adin Hector, MD  loperamide (IMODIUM A-D) 2 MG tablet Take 2 mg  by mouth 3 (three) times daily as needed for diarrhea or loose stools.    Historical Provider, MD  Multiple Vitamin (MULTIVITAMIN WITH MINERALS) TABS Take 1 tablet by mouth daily.    Historical Provider, MD  oxyCODONE (OXY IR/ROXICODONE) 5 MG immediate release tablet Take 1-2 tablets (5-10 mg total) by mouth every 4 (four) hours as needed for moderate pain, severe pain or breakthrough pain. 07/01/13   Adin Hector, MD  promethazine (PHENERGAN) 25 MG tablet Take 25 mg by mouth every 6 (six) hours as needed for nausea.  08/20/12   Historical Provider, MD    ALLERGIES:  Allergies  Allergen Reactions  . Shrimp [Shellfish Allergy] Other (See Comments)    [place]helps and sick    SOCIAL HISTORY:  History  Substance Use  Topics  . Smoking status: Former Smoker -- 1.50 packs/day for 20 years    Types: Cigarettes    Quit date: 07/27/1972  . Smokeless tobacco: Never Used  . Alcohol Use: No    FAMILY HISTORY: Family History  Problem Relation Age of Onset  . Cancer Mother     abdominal ca ?ovarian?  . Cancer Sister     liver cancer    EXAM: BP 151/60  Pulse 133  Temp(Src) 103.6 F (39.8 C) (Rectal)  Resp 18  SpO2 96% CONSTITUTIONAL: Alert and oriented and responds appropriately to questions. Well-appearing; well-nourished, nontoxic, pleasant HEAD: Normocephalic EYES: Conjunctivae clear, PERRL ENT: normal nose; no rhinorrhea; moist mucous membranes; pharynx without lesions noted NECK: Supple, no meningismus, no LAD  CARD: Tachycardic; S1 and S2 appreciated; no murmurs, no clicks, no rubs, no gallops RESP: Normal chest excursion without splinting or tachypnea; breath sounds clear and equal bilaterally; no wheezes, no rhonchi, no rales,  ABD/GI: Normal bowel sounds; non-distended; soft; well healing surgical scar in the right lower quadrant that is clean, dry and intact without purulent drainage or induration or erythema or warmth; patient is tender to palpation around his surgical incision but this seems appropriate after surgery, he is a nonsurgical abdomen, no peritoneal signs BACK:  The back appears normal and is non-tender to palpation, there is no CVA tenderness EXT: Normal ROM in all joints; non-tender to palpation; no edema; normal capillary refill; no cyanosis    SKIN: Normal color for age and race; warm NEURO: Moves all extremities equally PSYCH: The patient's mood and manner are appropriate. Grooming and personal hygiene are appropriate.  MEDICAL DECISION MAKING: Patient here with fever and body aches. He is mildly tender to palpation over his ileostomy site but this seems appropriate given his recent ileostomy takedown. No other focal signs of infection on exam. Will obtain labs, cultures,  x-ray. Will give Tylenol and IV fluids.  ED PROGRESS: Patient is still febrile and tachycardic. Will give second liter of IV fluids, Toradol. He still has no current complaints other than mild body aches. No meningismus on exam. Chest x-ray shows no pneumonia or obstruction. Urine shows no sign of infection. He has a mild leukocytosis with left shift but no obvious source. He is well-appearing, I feel he can be discharged home once his vital signs have improved. Flu swab is pending. Patient's heart rate has improved 140s to the 119. He is still normotensive. Dr. Roxanne Mins to follow up on repeat vitals.  Patient and family agree with plan to be discharged home.     [place], DO 07/05/13 [place], DO 07/06/13 [id number]

## 2013-07-05 NOTE — ED Notes (Signed)
Per EMS, pt took temperature at home, which was 107. Pt had take down of ileostomy on 1/23. Pt states he has not had bowel movement since yesterday.

## 2013-07-05 NOTE — Discharge Instructions (Signed)
Fever, Adult °A fever is a higher than normal body temperature. In an adult, an oral temperature around 98.6° F (37° C) is considered normal. A temperature of 100.4° F (38° C) or higher is generally considered a fever. Mild or moderate fevers generally have no long-term effects and often do not require treatment. Extreme fever (greater than or equal to 106° F or 41.1° C) can cause seizures. The sweating that may occur with repeated or prolonged fever may cause dehydration. Elderly people can develop confusion during a fever. °A measured temperature can vary with: °· Age. °· Time of day. °· Method of measurement (mouth, underarm, rectal, or ear). °The fever is confirmed by taking a temperature with a thermometer. Temperatures can be taken different ways. Some methods are accurate and some are not. °· An oral temperature is used most commonly. Electronic thermometers are fast and accurate. °· An ear temperature will only be accurate if the thermometer is positioned as recommended by the manufacturer. °· A rectal temperature is accurate and done for those adults who have a condition where an oral temperature cannot be taken. °· An underarm (axillary) temperature is not accurate and not recommended. °Fever is a symptom, not a disease.  °CAUSES  °· Infections commonly cause fever. °· Some noninfectious causes for fever include: °· Some arthritis conditions. °· Some thyroid or adrenal gland conditions. °· Some immune system conditions. °· Some types of cancer. °· A medicine reaction. °· High doses of certain street drugs such as methamphetamine. °· Dehydration. °· Exposure to high outside or room temperatures. °· Occasionally, the source of a fever cannot be determined. This is sometimes called a "fever of unknown origin" (FUO). °· Some situations may lead to a temporary rise in body temperature that may go away on its own. Examples are: °· Childbirth. °· Surgery. °· Intense exercise. °HOME CARE INSTRUCTIONS  °· Take  appropriate medicines for fever. Follow dosing instructions carefully. If you use acetaminophen to reduce the fever, be careful to avoid taking other medicines that also contain acetaminophen. Do not take aspirin for a fever if you are younger than age 19. There is an association with Reye's syndrome. Reye's syndrome is a rare but potentially deadly disease. °· If an infection is present and antibiotics have been prescribed, take them as directed. Finish them even if you start to feel better. °· Rest as needed. °· Maintain an adequate fluid intake. To prevent dehydration during an illness with prolonged or recurrent fever, you may need to drink extra fluid. Drink enough fluids to keep your urine clear or pale yellow. °· Sponging or bathing with room temperature water may help reduce body temperature. Do not use ice water or alcohol sponge baths. °· Dress comfortably, but do not over-bundle. °SEEK MEDICAL CARE IF:  °· You are unable to keep fluids down. °· You develop vomiting or diarrhea. °· You are not feeling at least partly better after 3 days. °· You develop new symptoms or problems. °SEEK IMMEDIATE MEDICAL CARE IF:  °· You have shortness of breath or trouble breathing. °· You develop excessive weakness. °· You are dizzy or you faint. °· You are extremely thirsty or you are making little or no urine. °· You develop new pain that was not there before (such as in the head, neck, chest, back, or abdomen). °· You have persistant vomiting and diarrhea for more than 1 to 2 days. °· You develop a stiff neck or your eyes become sensitive to light. °· You develop a   skin rash. °· You have a fever or persistent symptoms for more than 2 to 3 days. °· You have a fever and your symptoms suddenly get worse. °MAKE SURE YOU:  °· Understand these instructions. °· Will watch your condition. °· Will get help right away if you are not doing well or get worse. °Document Released: 11/16/2000 Document Revised: 08/15/2011 Document  Reviewed: 03/24/2011 °[employer]® Patient Information ©2014 [employer]. ° °Viral Infections °A viral infection can be caused by different types of viruses. Most viral infections are not serious and resolve on their own. However, some infections may cause severe symptoms and may lead to further complications. °SYMPTOMS °Viruses can frequently cause: °· Minor sore throat. °· Aches and pains. °· Headaches. °· Runny nose. °· Different types of rashes. °· Watery eyes. °· Tiredness. °· Cough. °· Loss of appetite. °· Gastrointestinal infections, resulting in nausea, vomiting, and diarrhea. °These symptoms do not respond to antibiotics because the infection is not caused by bacteria. However, you might catch a bacterial infection following the viral infection. This is sometimes called a "superinfection." Symptoms of such a bacterial infection may include: °· Worsening sore throat with pus and difficulty swallowing. °· Swollen neck glands. °· Chills and a high or persistent fever. °· Severe headache. °· Tenderness over the sinuses. °· Persistent overall ill feeling (malaise), muscle aches, and tiredness (fatigue). °· Persistent cough. °· Yellow, green, or brown mucus production with coughing. °HOME CARE INSTRUCTIONS  °· Only take over-the-counter or prescription medicines for pain, discomfort, diarrhea, or fever as directed by your caregiver. °· Drink enough water and fluids to keep your urine clear or pale yellow. Sports drinks can provide valuable electrolytes, sugars, and hydration. °· Get plenty of rest and maintain proper nutrition. Soups and broths with crackers or rice are fine. °SEEK IMMEDIATE MEDICAL CARE IF:  °· You have severe headaches, shortness of breath, chest pain, neck pain, or an unusual rash. °· You have uncontrolled vomiting, diarrhea, or you are unable to keep down fluids. °· You or your child has an oral temperature above 102° F (38.9° C), not controlled by medicine. °· Your baby is older than 3 months  with a rectal temperature of 102° F (38.9° C) or higher. °· Your baby is 3 months old or younger with a rectal temperature of 100.4° F (38° C) or higher. °MAKE SURE YOU:  °· Understand these instructions. °· Will watch your condition. °· Will get help right away if you are not doing well or get worse. °Document Released: 03/02/2005 Document Revised: 08/15/2011 Document Reviewed: 09/27/2010 °Exit[employer]® Patient Information ©2014 [employer]. ° °

## 2013-07-06 ENCOUNTER — Telehealth (HOSPITAL_BASED_OUTPATIENT_CLINIC_OR_DEPARTMENT_OTHER): Payer: Self-pay | Admitting: Emergency Medicine

## 2013-07-06 ENCOUNTER — Inpatient Hospital Stay (HOSPITAL_COMMUNITY)
Admission: AD | Admit: 2013-07-06 | Discharge: 2013-07-11 | DRG: 289 | Disposition: A | Discharge status: Home Health | Payer: Medicare Other | Source: Ambulatory Visit | Attending: Surgery | Admitting: Surgery

## 2013-07-06 ENCOUNTER — Encounter (HOSPITAL_COMMUNITY): Payer: Self-pay

## 2013-07-06 DIAGNOSIS — Z87891 Personal history of nicotine dependence: Secondary | ICD-10-CM

## 2013-07-06 DIAGNOSIS — Z9049 Acquired absence of other specified parts of digestive tract: Secondary | ICD-10-CM

## 2013-07-06 DIAGNOSIS — I059 Rheumatic mitral valve disease, unspecified: Secondary | ICD-10-CM | POA: Diagnosis present

## 2013-07-06 DIAGNOSIS — I340 Nonrheumatic mitral (valve) insufficiency: Secondary | ICD-10-CM | POA: Diagnosis present

## 2013-07-06 DIAGNOSIS — K219 Gastro-esophageal reflux disease without esophagitis: Secondary | ICD-10-CM | POA: Diagnosis present

## 2013-07-06 DIAGNOSIS — B0229 Other postherpetic nervous system involvement: Secondary | ICD-10-CM | POA: Diagnosis present

## 2013-07-06 DIAGNOSIS — R7881 Bacteremia: Secondary | ICD-10-CM

## 2013-07-06 DIAGNOSIS — E039 Hypothyroidism, unspecified: Secondary | ICD-10-CM | POA: Diagnosis present

## 2013-07-06 DIAGNOSIS — I330 Acute and subacute infective endocarditis: Principal | ICD-10-CM | POA: Diagnosis present

## 2013-07-06 DIAGNOSIS — Z8679 Personal history of other diseases of the circulatory system: Secondary | ICD-10-CM | POA: Diagnosis present

## 2013-07-06 DIAGNOSIS — Z932 Ileostomy status: Secondary | ICD-10-CM

## 2013-07-06 DIAGNOSIS — Z923 Personal history of irradiation: Secondary | ICD-10-CM

## 2013-07-06 DIAGNOSIS — Z86718 Personal history of other venous thrombosis and embolism: Secondary | ICD-10-CM

## 2013-07-06 DIAGNOSIS — E785 Hyperlipidemia, unspecified: Secondary | ICD-10-CM | POA: Diagnosis present

## 2013-07-06 DIAGNOSIS — I339 Acute and subacute endocarditis, unspecified: Secondary | ICD-10-CM

## 2013-07-06 DIAGNOSIS — C19 Malignant neoplasm of rectosigmoid junction: Secondary | ICD-10-CM | POA: Diagnosis present

## 2013-07-06 DIAGNOSIS — I10 Essential (primary) hypertension: Secondary | ICD-10-CM | POA: Diagnosis present

## 2013-07-06 DIAGNOSIS — B952 Enterococcus as the cause of diseases classified elsewhere: Secondary | ICD-10-CM | POA: Diagnosis present

## 2013-07-06 DIAGNOSIS — R6510 Systemic inflammatory response syndrome (SIRS) of non-infectious origin without acute organ dysfunction: Secondary | ICD-10-CM | POA: Diagnosis present

## 2013-07-06 DIAGNOSIS — C20 Malignant neoplasm of rectum: Secondary | ICD-10-CM | POA: Diagnosis present

## 2013-07-06 DIAGNOSIS — D638 Anemia in other chronic diseases classified elsewhere: Secondary | ICD-10-CM | POA: Diagnosis present

## 2013-07-06 DIAGNOSIS — K624 Stenosis of anus and rectum: Secondary | ICD-10-CM | POA: Diagnosis present

## 2013-07-06 DIAGNOSIS — Z9221 Personal history of antineoplastic chemotherapy: Secondary | ICD-10-CM

## 2013-07-06 DIAGNOSIS — D72829 Elevated white blood cell count, unspecified: Secondary | ICD-10-CM | POA: Diagnosis present

## 2013-07-06 LAB — CBC
HCT: 27.3 % — ABNORMAL LOW (ref 39.0–52.0)
Hemoglobin: 9 g/dL — ABNORMAL LOW (ref 13.0–17.0)
MCH: 35.4 pg — ABNORMAL HIGH (ref 26.0–34.0)
MCHC: 33 g/dL (ref 30.0–36.0)
MCV: 107.5 fL — ABNORMAL HIGH (ref 78.0–100.0)
Platelets: 190 10*3/uL (ref 150–400)
RBC: 2.54 MIL/uL — ABNORMAL LOW (ref 4.22–5.81)
RDW: 14.9 % (ref 11.5–15.5)
WBC: 9.4 10*3/uL (ref 4.0–10.5)

## 2013-07-06 LAB — INFLUENZA PANEL BY PCR (TYPE A & B)
H1N1FLUPCR: NOT DETECTED
Influenza A By PCR: NEGATIVE
Influenza B By PCR: NEGATIVE

## 2013-07-06 LAB — CREATININE, SERUM
CREATININE: 1.32 mg/dL (ref 0.50–1.35)
GFR calc Af Amer: 62 mL/min — ABNORMAL LOW (ref >90)
GFR calc non Af Amer: 53 mL/min — ABNORMAL LOW (ref >90)

## 2013-07-06 MED ORDER — SODIUM CHLORIDE 0.9 % IV SOLN
1500.0000 mg | Freq: Once | INTRAVENOUS | Status: AC
  Administered 2013-07-06: 1500 mg via INTRAVENOUS
  Filled 2013-07-06: qty 1500

## 2013-07-06 MED ORDER — PIPERACILLIN-TAZOBACTAM 3.375 G IVPB
3.3750 g | Freq: Three times a day (TID) | INTRAVENOUS | Status: DC
  Administered 2013-07-06 – 2013-07-08 (×5): 3.375 g via INTRAVENOUS
  Filled 2013-07-06 (×8): qty 50

## 2013-07-06 MED ORDER — IBUPROFEN 600 MG PO TABS
600.0000 mg | ORAL_TABLET | Freq: Four times a day (QID) | ORAL | Status: DC | PRN
Start: 2013-07-06 — End: 2013-07-08
  Filled 2013-07-06: qty 1

## 2013-07-06 MED ORDER — VANCOMYCIN HCL IN DEXTROSE 1-5 GM/200ML-% IV SOLN
1000.0000 mg | Freq: Two times a day (BID) | INTRAVENOUS | Status: DC
  Administered 2013-07-07 – 2013-07-08 (×3): 1000 mg via INTRAVENOUS
  Filled 2013-07-06 (×4): qty 200

## 2013-07-06 MED ORDER — ALPRAZOLAM 0.5 MG PO TABS
0.5000 mg | ORAL_TABLET | Freq: Every evening | ORAL | Status: DC | PRN
  Administered 2013-07-07 – 2013-07-10 (×4): 0.5 mg via ORAL
  Filled 2013-07-06 (×4): qty 1

## 2013-07-06 MED ORDER — PROMETHAZINE HCL 25 MG PO TABS: 25.0000 mg | ORAL_TABLET | Freq: Four times a day (QID) | ORAL | Status: DC | PRN

## 2013-07-06 MED ORDER — OXYCODONE HCL 5 MG PO TABS
5.0000 mg | ORAL_TABLET | ORAL | Status: DC | PRN
  Administered 2013-07-07: 10 mg via ORAL
  Administered 2013-07-07 (×2): 5 mg via ORAL
  Administered 2013-07-07: 10 mg via ORAL
  Administered 2013-07-08 (×3): 5 mg via ORAL
  Administered 2013-07-08 – 2013-07-11 (×12): 10 mg via ORAL
  Filled 2013-07-06 (×4): qty 2
  Filled 2013-07-06: qty 1
  Filled 2013-07-06 (×5): qty 2
  Filled 2013-07-06: qty 1
  Filled 2013-07-06 (×3): qty 2
  Filled 2013-07-06 (×2): qty 1
  Filled 2013-07-06 (×2): qty 2
  Filled 2013-07-06: qty 1

## 2013-07-06 MED ORDER — DIPHENOXYLATE-ATROPINE 2.5-0.025 MG PO TABS
1.0000 | ORAL_TABLET | Freq: Four times a day (QID) | ORAL | Status: DC | PRN
Start: 2013-07-06 — End: 2013-07-11

## 2013-07-06 MED ORDER — IBUPROFEN 200 MG PO TABS
400.0000 mg | ORAL_TABLET | Freq: Once | ORAL | Status: AC
  Administered 2013-07-06: 400 mg via ORAL
  Filled 2013-07-06: qty 2

## 2013-07-06 MED ORDER — ACETAMINOPHEN 325 MG PO TABS
650.0000 mg | ORAL_TABLET | Freq: Four times a day (QID) | ORAL | Status: DC | PRN
  Administered 2013-07-07 – 2013-07-08 (×2): 650 mg via ORAL
  Filled 2013-07-06 (×2): qty 2

## 2013-07-06 MED ORDER — ACETAMINOPHEN 650 MG RE SUPP: 650.0000 mg | Freq: Four times a day (QID) | RECTAL | Status: DC | PRN

## 2013-07-06 MED ORDER — KCL IN DEXTROSE-NACL 10-5-0.45 MEQ/L-%-% IV SOLN
INTRAVENOUS | Status: DC
  Administered 2013-07-06: 21:00:00 via INTRAVENOUS
  Administered 2013-07-07: 75 mL/h via INTRAVENOUS
  Administered 2013-07-08: 05:00:00 via INTRAVENOUS
  Filled 2013-07-06 (×5): qty 1000

## 2013-07-06 MED ORDER — DIPHENHYDRAMINE HCL 12.5 MG/5ML PO ELIX: 12.5000 mg | ORAL_SOLUTION | Freq: Four times a day (QID) | ORAL | Status: DC | PRN

## 2013-07-06 MED ORDER — HYDROCORTISONE 2.5 % RE CREA
1.0000 "application " | TOPICAL_CREAM | Freq: Two times a day (BID) | RECTAL | Status: DC | PRN
  Filled 2013-07-06: qty 28.35

## 2013-07-06 MED ORDER — DIPHENHYDRAMINE HCL 50 MG/ML IJ SOLN: 12.5000 mg | Freq: Four times a day (QID) | INTRAMUSCULAR | Status: DC | PRN

## 2013-07-06 MED ORDER — HEPARIN SODIUM (PORCINE) 5000 UNIT/ML IJ SOLN
5000.0000 [IU] | Freq: Three times a day (TID) | INTRAMUSCULAR | Status: DC
  Administered 2013-07-06 – 2013-07-08 (×6): 5000 [IU] via SUBCUTANEOUS
  Filled 2013-07-06 (×8): qty 1

## 2013-07-06 MED ORDER — FERROUS SULFATE 325 (65 FE) MG PO TABS
325.0000 mg | ORAL_TABLET | Freq: Two times a day (BID) | ORAL | Status: DC
  Administered 2013-07-07 – 2013-07-08 (×3): 325 mg via ORAL
  Filled 2013-07-06 (×5): qty 1

## 2013-07-06 MED ORDER — DIAZEPAM 2 MG PO TABS: 2.0000 mg | ORAL_TABLET | Freq: Every evening | ORAL | Status: DC | PRN

## 2013-07-06 MED ORDER — LISINOPRIL 10 MG PO TABS
10.0000 mg | ORAL_TABLET | Freq: Every morning | ORAL | Status: DC
  Administered 2013-07-07 – 2013-07-11 (×3): 10 mg via ORAL
  Filled 2013-07-06 (×5): qty 1

## 2013-07-06 MED ORDER — IBUPROFEN 200 MG PO TABS
400.0000 mg | ORAL_TABLET | Freq: Once | ORAL | Status: DC
  Filled 2013-07-06: qty 2

## 2013-07-06 NOTE — ED Notes (Signed)
VS updated Patient states that he is ready to go home Will make MD/PA aware

## 2013-07-06 NOTE — ED Notes (Signed)
Patient is alert and oriented x3.  He was given DC instructions and follow up visit instructions.  Patient gave verbal understanding.  He was DC via wheelchair and ambulatory under his own power to home.  V/S stable.  He was not showing any signs of distress on DC

## 2013-07-06 NOTE — Telephone Encounter (Signed)
Lab called + blood culture, gram positive cocci in pairs and chains in all four bottles. EDP Dr Leonides Schanz notified and instructed to contact patient to return to ED. Patient contacted, notified of + lab result and instructed to return to ED today.

## 2013-07-06 NOTE — ED Notes (Signed)
Lab called + blood culture, gram positive cocci in pairs and chains in all four bottles. EDP Dr Ward notified and instructed to contact patient to return to ED. Patient contacted, notified of + lab result and instructed to return to ED today. 

## 2013-07-06 NOTE — ED Notes (Signed)
MD at bedside. 

## 2013-07-06 NOTE — ED Notes (Signed)
Patient resting in position of comfort with eyes closed RR WNL--even and unlabored with equal rise and fall of chest Patient in NAD Side rails up, call bell in reach  

## 2013-07-06 NOTE — ED Provider Notes (Signed)
Initially seen and evaluated by Dr. Leonides Schanz. He presented with a fever and tachycardia. Fevers come down with acetaminophen and 10 tachycardia has improved with acetaminophen and IV fluids. Initial heart rate was 136 and is now down to 107. Patient was evaluated and he is resting comfortably and is nontoxic in appearance. It is felt that it is safe for him to go home. Clinically, he has influenza and is sent home with prescription for oseltamivir.  Delora Fuel, MD 70/01/74 [id number]

## 2013-07-06 NOTE — Progress Notes (Signed)
ANTIBIOTIC CONSULT NOTE - INITIAL  Pharmacy Consult for Vancomycin, Zosyn Indication: Bacteremia  Allergies  Allergen Reactions  . Shrimp [Shellfish Allergy] Hives, Nausea And Vomiting and Rash    Patient Measurements: Height: 5\' 10"  (177.8 cm) Weight: 185 lb 9.6 oz (84.188 kg) IBW/kg (Calculated) : 73  Vital Signs: Temp: 98 F (36.7 C) (01/31 1822) Temp src: Oral (01/31 1822) BP: 121/64 mmHg (01/31 1822) Pulse Rate: 110 (01/31 1822)  Labs:  Recent Labs  07/05/13 2225  WBC 11.0*  HGB 9.9*  PLT 229  CREATININE 1.13   Estimated Creatinine Clearance: 63.7 ml/min (by C-G formula based on Cr of 1.13).  Microbiology: Recent Results (from the past 720 hour(s))  CULTURE, BLOOD (ROUTINE X 2)     Status: None   Collection Time    07/05/13 10:25 PM      Result Value Range Status   Specimen Description BLOOD LEFT ARM   Final   Special Requests BOTTLES DRAWN AEROBIC AND ANAEROBIC 5CC   Final   Culture  Setup Time     Final   Value: 07/06/2013 04:46     Performed at [facility]   Culture     Final   Value: GRAM POSITIVE COCCI IN PAIRS AND CHAINS     Note: Gram Stain Report Called to,Read Back By and Verified With: Georgina Peer (flow mgr.) on 07/06/13 at 15:10 by Rise Mu     Performed at [facility]   Report Status PENDING   Incomplete  CULTURE, BLOOD (ROUTINE X 2)     Status: None   Collection Time    07/05/13 10:33 PM      Result Value Range Status   Specimen Description BLOOD RIGHT ARM   Final   Special Requests BOTTLES DRAWN AEROBIC AND ANAEROBIC 10CC   Final   Culture  Setup Time     Final   Value: 07/06/2013 04:46     Performed at [facility]   Culture     Final   Value: GRAM POSITIVE COCCI IN PAIRS AND CHAINS     Note: Gram Stain Report Called to,Read Back By and Verified With: Georgina Peer (flow mgr.) on 07/06/13 at 15:10 by Rise Mu     Performed at [facility]   Report Status PENDING   Incomplete    Medical  History: Past Medical History  Diagnosis Date  . Mitral regurgitation due to partially flail posterior mitral valve leaflet   . Rectal cancer   . Hyperlipidemia   . Colon cancer 07/18/12 bx    rectum=Invasive adenocarcinoma w/ extracellular mucin,polyp=benign  . Rheumatic fever as child    hx  . Hearing loss     partial greater on left  . Tinnitus     left ear  . Sinusitis     seasonal  . Arthritis     hands/knees  . Hematochezia     recent  . Heart murmur   . Hypothyroidism as child    hx of  . Numbness     TOES / FINGERS DUE TO CHEMO  . History of shingles   . Blood clot in vein     RT ARM WITH PICC LINE   . GERD (gastroesophageal reflux disease)     OCCASIONAL  . Nocturia   . Radiation     FOR COLON CANCER  . Umbilical hernia, incarcerated - s/p primary repair July 2014 01/03/2013  . Herpes zoster - R flank - with severe pain  03/11/2013    Medications:  Anti-infectives   None     Assessment: 75 yoM with PMH of rectal cancer s/p resection and finished chemo in Nov, 2014.  He was recently admitted for take down of ileostomy on 06/28/13 and returned to ED with fever and tachycardia on 1/30. Felt OK to discharge to home with tamiflu prescription for likely influenza and was not admitted.  Pt called 1/31 to come back to ED since 4/4 bottles of blood cxt + GPCs.  Pharmacy consulted to dose vancomycin and zosyn for bacteremia.  Tmax: 103.6  WBCs: 11  Renal: SCr 1.13, CrCl ~ 64 ml/min  Blood cultures in process   Goal of Therapy:  Vancomycin trough level 15-20 mcg/ml Appropriate abx dosing, eradication of infection.   Plan:   Zosyn 3.375 g IV Q8H infused over 4hrs.  Vancomycin 1500 mg IV x1, then 1g IV q12h.  Measure Vanc trough at steady state.  Follow up renal fxn and culture results.   Gretta Arab PharmD, BCPS Pager [phone] 07/06/2013 7:03 PM

## 2013-07-06 NOTE — H&P (Signed)
Tyler Diaz is an 68 y.o. male.   Chief Complaint: Bactermia HPI:  Patient is a 68 year old male who is status post ileostomy takedown. He went home around 5 days ago. He came the emergency room last night because of shaking chills and fevers between 103 and 107? At home. He had blood cultures drawn in the emergency department. He appeared clinically well once his temperature stabilized and he was discharged to home. He was called back today because of positive blood cultures.  His temperature has remained lower since he has been alternating ibuprofen and Tylenol.  He finally had a "big blowout" today and had "poop everywhere."  He denies any nausea or vomiting.  He has been eating well.  He denies drainage from his wound.  Past Medical History  Diagnosis Date  . Mitral regurgitation due to partially flail posterior mitral valve leaflet   . Rectal cancer   . Hyperlipidemia   . Colon cancer 07/18/12 bx    rectum=Invasive adenocarcinoma w/ extracellular mucin,polyp=benign  . Rheumatic fever as child    hx  . Hearing loss     partial greater on left  . Tinnitus     left ear  . Sinusitis     seasonal  . Arthritis     hands/knees  . Hematochezia     recent  . Heart murmur   . Hypothyroidism as child    hx of  . Numbness     TOES / FINGERS DUE TO CHEMO  . History of shingles   . Blood clot in vein     RT ARM WITH PICC LINE   . GERD (gastroesophageal reflux disease)     OCCASIONAL  . Nocturia   . Radiation     FOR COLON CANCER  . Umbilical hernia, incarcerated - s/p primary repair July 2014 01/03/2013  . Herpes zoster - R flank - with severe pain 03/11/2013    Past Surgical History  Procedure Laterality Date  . Carpal tunnel release Right 2002  . Tonsillectomy      as child  . Laparoscopic low anterior resection N/A 12/27/2012    Procedure: LAPAROSCOPIC LOW ANTERIOR RESECTION, COLO-ANAL ANASTOMOSIS, DIVERTING LOOP ILEOSTOMY,SPLENIC FLEXURE MOBILIZATION, PRIMARY INCARCERATED  UMBILICAL HERNIA REPAIR;  Surgeon: Adin Hector, MD;  Location: [facility];  Service: General;  Laterality: N/A;  . Laparoscopic low anterior rescection with coloanal anastomosis N/A 12/27/2012    Procedure: LAPAROSCOPIC LOW ANTERIOR RESCECTION WITH COLOANAL ANASTOMOSIS;  Surgeon: Adin Hector, MD;  Location: [facility];  Service: General;  Laterality: N/A;  . Ileostomy N/A 12/27/2012    Procedure: DIVERTING LOOP ILEOSTOMY;  Surgeon: Adin Hector, MD;  Location: [facility];  Service: General;  Laterality: N/A;  . Umbilical hernia repair  12/27/2012    Procedure: PRIMARY REPAIR INCARCERATED UMBILICAL HERNIA;  Surgeon: Adin Hector, MD;  Location: [facility];  Service: General;;  . Hernia repair    . Ileostomy closure N/A 06/28/2013    Procedure: loop ILEOSTOMY TAKEDOWN examination of anesthesia ;  Surgeon: Adin Hector, MD;  Location: [facility];  Service: General;  Laterality: N/A;    Family History  Problem Relation Age of Onset  . Cancer Mother     abdominal ca ?ovarian?  . Cancer Sister     liver cancer   Social History:  reports that he quit smoking about 40 years ago. His smoking use included Cigarettes. He has a 30 pack-year smoking history. He has never used smokeless tobacco. He reports  that he does not drink alcohol or use illicit drugs.  Allergies:  Allergies  Allergen Reactions  . Shrimp [Shellfish Allergy] Hives, Nausea And Vomiting and Rash    Medications Prior to Admission  Medication Sig Dispense Refill  . acetaminophen (TYLENOL) 325 MG tablet Take 650 mg by mouth every 6 (six) hours as needed for mild pain.      Marland Kitchen acetaminophen-codeine (TYLENOL #3) 300-30 MG per tablet Take 1-2 tablets by mouth every 4 (four) hours as needed for moderate pain.       . ferrous sulfate 325 (65 FE) MG tablet Take 1 tablet (325 mg total) by mouth 2 (two) times daily with a meal.  60 tablet  1  . hydrocortisone (ANUSOL-HC) 2.5 % rectal cream Place 1 application rectally 2 (two) times daily as needed  for hemorrhoids.      Marland Kitchen ibuprofen (ADVIL,MOTRIN) 200 MG tablet Take 400 mg by mouth every 6 (six) hours as needed.      Marland Kitchen oseltamivir (TAMIFLU) 75 MG capsule Take 1 capsule (75 mg total) by mouth every 12 (twelve) hours.  10 capsule  0  . oxyCODONE (OXY IR/ROXICODONE) 5 MG immediate release tablet Take 1-2 tablets (5-10 mg total) by mouth every 4 (four) hours as needed for moderate pain, severe pain or breakthrough pain.  60 tablet  0  . oxymetazoline (AFRIN) 0.05 % nasal spray Place 1 spray into both nostrils 2 (two) times daily as needed for congestion.      . Pediatric Multiple Vit-C-FA (PEDIATRIC MULTIVITAMIN) chewable tablet Chew 2 tablets by mouth daily.      . polyethylene glycol (MIRALAX / GLYCOLAX) packet Take 17 g by mouth daily.      . promethazine (PHENERGAN) 25 MG tablet Take 25 mg by mouth every 6 (six) hours as needed for nausea.       . Multiple Vitamin (MULTIVITAMIN WITH MINERALS) TABS Take 1 tablet by mouth every morning.         Results for orders placed during the hospital encounter of 07/05/13 (from the past 48 hour(s))  CBC WITH DIFFERENTIAL     Status: Abnormal   Collection Time    07/05/13 10:25 PM      Result Value Range   WBC 11.0 (*) 4.0 - 10.5 K/uL   RBC 2.74 (*) 4.22 - 5.81 MIL/uL   Hemoglobin 9.9 (*) 13.0 - 17.0 g/dL   HCT 29.1 (*) 39.0 - 52.0 %   MCV 106.2 (*) 78.0 - 100.0 fL   MCH 36.1 (*) 26.0 - 34.0 pg   MCHC 34.0  30.0 - 36.0 g/dL   RDW 14.4  11.5 - 15.5 %   Platelets 229  150 - 400 K/uL   Neutrophils Relative % 88 (*) 43 - 77 %   Neutro Abs 9.7 (*) 1.7 - 7.7 K/uL   Lymphocytes Relative 4 (*) 12 - 46 %   Lymphs Abs 0.4 (*) 0.7 - 4.0 K/uL   Monocytes Relative 8  3 - 12 %   Monocytes Absolute 0.9  0.1 - 1.0 K/uL   Eosinophils Relative 1  0 - 5 %   Eosinophils Absolute 0.1  0.0 - 0.7 K/uL   Basophils Relative 0  0 - 1 %   Basophils Absolute 0.0  0.0 - 0.1 K/uL  COMPREHENSIVE METABOLIC PANEL     Status: Abnormal   Collection Time    07/05/13 10:25  PM      Result Value Range   Sodium 138  137 - 147 mEq/L   Potassium 4.9  3.7 - 5.3 mEq/L   Chloride 100  96 - 112 mEq/L   CO2 27  19 - 32 mEq/L   Glucose, Bld 116 (*) 70 - 99 mg/dL   BUN 15  6 - 23 mg/dL   Creatinine, Ser 1.13  0.50 - 1.35 mg/dL   Calcium 8.9  8.4 - 10.5 mg/dL   Total Protein 6.6  6.0 - 8.3 g/dL   Albumin 3.4 (*) 3.5 - 5.2 g/dL   AST 25  0 - 37 U/L   ALT 31  0 - 53 U/L   Alkaline Phosphatase 97  39 - 117 U/L   Total Bilirubin 0.4  0.3 - 1.2 mg/dL   GFR calc non Af Amer 64 (*) >90 mL/min   GFR calc Af Amer 75 (*) >90 mL/min   Comment: (NOTE)     The eGFR has been calculated using the CKD EPI equation.     This calculation has not been validated in all clinical situations.     eGFR's persistently <90 mL/min signify possible Chronic Kidney     Disease.  CULTURE, BLOOD (ROUTINE X 2)     Status: None   Collection Time    07/05/13 10:25 PM      Result Value Range   Specimen Description BLOOD LEFT ARM     Special Requests BOTTLES DRAWN AEROBIC AND ANAEROBIC 5CC     Culture  Setup Time       Value: 07/06/2013 04:46     Performed at [facility]   Culture       Value: [place] IN PAIRS AND CHAINS     Note: Gram Stain Report Called to,Read Back By and Verified With: Georgina Peer (flow mgr.) on 07/06/13 at 15:10 by Rise Mu     Performed at [facility]   Report Status PENDING    CULTURE, BLOOD (ROUTINE X 2)     Status: None   Collection Time    07/05/13 10:33 PM      Result Value Range   Specimen Description BLOOD RIGHT ARM     Special Requests BOTTLES DRAWN AEROBIC AND ANAEROBIC 10CC     Culture  Setup Time       Value: 07/06/2013 04:46     Performed at [facility]   Culture       Value: [place] IN PAIRS AND CHAINS     Note: Gram Stain Report Called to,Read Back By and Verified With: Georgina Peer (flow mgr.) on 07/06/13 at 15:10 by Rise Mu     Performed at [facility]   Report Status PENDING     INFLUENZA PANEL BY PCR (TYPE A & B, H1N1)     Status: None   Collection Time    07/05/13 11:14 PM      Result Value Range   Influenza A By PCR NEGATIVE  NEGATIVE   Influenza B By PCR NEGATIVE  NEGATIVE   H1N1 flu by pcr NOT DETECTED  NOT DETECTED   Comment:            The Xpert Flu assay (FDA approved for     nasal aspirates or washes and     nasopharyngeal swab specimens), is     intended as an aid in the diagnosis of     influenza and should not be used as     a sole basis for treatment.  Performed at [place] [place] [facility]CROSCOPIC     Status: None   Collection Time    07/05/13 11:17 PM      Result Value Range   Color, Urine YELLOW  YELLOW   APPearance CLEAR  CLEAR   Specific Gravity, Urine 1.016  1.005 - 1.030   pH 8.0  5.0 - 8.0   Glucose, UA NEGATIVE  NEGATIVE mg/dL   Hgb urine dipstick NEGATIVE  NEGATIVE   Bilirubin Urine NEGATIVE  NEGATIVE   Ketones, ur NEGATIVE  NEGATIVE mg/dL   Protein, ur NEGATIVE  NEGATIVE mg/dL   Urobilinogen, UA 0.2  0.0 - 1.0 mg/dL   Nitrite NEGATIVE  NEGATIVE   Leukocytes, UA NEGATIVE  NEGATIVE   Comment: MICROSCOPIC NOT DONE ON URINES WITH NEGATIVE PROTEIN, BLOOD, LEUKOCYTES, NITRITE, OR GLUCOSE <1000 mg/dL.   Dg Abd Acute W/chest  07/05/2013   CLINICAL DATA:  Fever after recent ileostomy take-down  EXAM: ACUTE ABDOMEN SERIES (ABDOMEN 2 VIEW & CHEST 1 VIEW)  COMPARISON:  08/02/2012 chest CT  FINDINGS: Normal heart size and mediastinal contours. No acute infiltrate or edema. No effusion or pneumothorax. There is minimal atelectasis or scar at the peripheral left base. Previously noted pulmonary nodules are not visible. No acute osseous findings.  No gas dilated bowel. No abnormal fluid levels. Stool is present within the proximal colon, reassuring finding after ileostomy takedown. Negative for pneumoperitoneum.  IMPRESSION: Negative abdominal radiographs.  No evidence of pneumonia.   Electronically Signed   By:  Jorje Guild M.D.   On: 07/05/2013 23:28    Review of Systems  Constitutional: Positive for fever and chills.  HENT: Negative.   Eyes: Negative.   Respiratory: Negative.   Cardiovascular:       Tachycardia   Gastrointestinal: Negative.   Genitourinary: Negative.   Musculoskeletal: Negative.   Skin: Negative.   Neurological: Negative.   Endo/Heme/Allergies: Negative.   Psychiatric/Behavioral: Negative.     Blood pressure 121/64, pulse 110, temperature 98 F (36.7 C), temperature source Oral, resp. rate 18, height 5' 10"  (1.778 m), weight 185 lb 9.6 oz (84.188 kg), SpO2 98.00%. Physical Exam  Constitutional: He is oriented to person, place, and time. He appears well-developed and well-nourished. No distress.  HENT:  Head: Normocephalic and atraumatic.  Eyes: Conjunctivae are normal. Pupils are equal, round, and reactive to light. Right eye exhibits no discharge. Left eye exhibits no discharge. No scleral icterus.  Neck: Normal range of motion. No thyromegaly present.  Cardiovascular: Normal rate, regular rhythm and intact distal pulses.   Tachycardic   Respiratory: Effort normal. No respiratory distress.  GI: Soft. He exhibits no distension and no mass. There is tenderness (approp tender at incision). There is no rebound and no guarding.  Musculoskeletal: He exhibits no edema.  Neurological: He is alert and oriented to person, place, and time.  Skin: Skin is warm and dry. He is not diaphoretic.  Psychiatric: He has a normal mood and affect. His behavior is normal. Judgment and thought content normal.     Assessment/Plan Bacteremia - vanc/zosyn CT scan Clears May need echo if staph aureus grows.   Will wait on final speciation and sensitivities.     Navin Dogan 07/06/2013, 7:14 PM

## 2013-07-07 ENCOUNTER — Inpatient Hospital Stay (HOSPITAL_COMMUNITY): Payer: Medicare Other

## 2013-07-07 LAB — CBC
HCT: 22.7 % — ABNORMAL LOW (ref 39.0–52.0)
HEMOGLOBIN: 7.7 g/dL — AB (ref 13.0–17.0)
MCH: 36 pg — ABNORMAL HIGH (ref 26.0–34.0)
MCHC: 33.9 g/dL (ref 30.0–36.0)
MCV: 106.1 fL — ABNORMAL HIGH (ref 78.0–100.0)
PLATELETS: 156 10*3/uL (ref 150–400)
RBC: 2.14 MIL/uL — ABNORMAL LOW (ref 4.22–5.81)
RDW: 14.8 % (ref 11.5–15.5)
WBC: 7.8 10*3/uL (ref 4.0–10.5)

## 2013-07-07 LAB — URINE CULTURE

## 2013-07-07 LAB — BASIC METABOLIC PANEL
BUN: 15 mg/dL (ref 6–23)
CHLORIDE: 101 meq/L (ref 96–112)
CO2: 23 meq/L (ref 19–32)
Calcium: 7.9 mg/dL — ABNORMAL LOW (ref 8.4–10.5)
Creatinine, Ser: 1.27 mg/dL (ref 0.50–1.35)
GFR calc Af Amer: 65 mL/min — ABNORMAL LOW (ref >90)
GFR calc non Af Amer: 56 mL/min — ABNORMAL LOW (ref >90)
GLUCOSE: 136 mg/dL — AB (ref 70–99)
Potassium: 4.2 mEq/L (ref 3.7–5.3)
Sodium: 134 mEq/L — ABNORMAL LOW (ref 137–147)

## 2013-07-07 MED ORDER — IOHEXOL 300 MG/ML  SOLN
50.0000 mL | Freq: Once | INTRAMUSCULAR | Status: AC | PRN
  Administered 2013-07-07: 50 mL via ORAL

## 2013-07-07 MED ORDER — IOHEXOL 300 MG/ML  SOLN
100.0000 mL | Freq: Once | INTRAMUSCULAR | Status: AC | PRN
  Administered 2013-07-07: 100 mL via INTRAVENOUS

## 2013-07-07 NOTE — Progress Notes (Signed)
Patient ID: Tyler Diaz, male   DOB: [birth year], 68 y.o.   MRN: [id number] [facility] Surgery Progress Note:   * No surgery found *  Subjective: Mental status is clear.  No complaints.   Objective: Vital signs in last 24 hours: Temp:  [98 F (36.7 C)-100.1 F (37.8 C)] 99.9 F (37.7 C) (02/01 0519) Pulse Rate:  [104-118] 104 (02/01 0519) Resp:  [18] 18 (02/01 0519) BP: (103-127)/(53-65) 103/59 mmHg (02/01 0519) SpO2:  [93 %-98 %] 93 % (02/01 0519) Weight:  [185 lb 9.6 oz (84.188 kg)] 185 lb 9.6 oz (84.188 kg) (01/31 1839)  Intake/Output from previous day: 01/31 0701 - 02/01 0700 In: 792.5 [I.V.:692.5; IV Piggyback:100] Out: 400 [Urine:400] Intake/Output this shift:    Physical Exam: Work of breathing is normal.  Not complaining of any pain but occasional sweats.    Lab Results:  Results for orders placed during the hospital encounter of 07/06/13 (from the past 48 hour(s))  CBC     Status: Abnormal   Collection Time    07/06/13  8:30 PM      Result Value Range   WBC 9.4  4.0 - 10.5 K/uL   RBC 2.54 (*) 4.22 - 5.81 MIL/uL   Hemoglobin 9.0 (*) 13.0 - 17.0 g/dL   HCT 27.3 (*) 39.0 - 52.0 %   MCV 107.5 (*) 78.0 - 100.0 fL   MCH 35.4 (*) 26.0 - 34.0 pg   MCHC 33.0  30.0 - 36.0 g/dL   RDW 14.9  11.5 - 15.5 %   Platelets 190  150 - 400 K/uL  CREATININE, SERUM     Status: Abnormal   Collection Time    07/06/13  8:30 PM      Result Value Range   Creatinine, Ser 1.32  0.50 - 1.35 mg/dL   GFR calc non Af Amer 53 (*) >90 mL/min   GFR calc Af Amer 62 (*) >90 mL/min   Comment: (NOTE)     The eGFR has been calculated using the CKD EPI equation.     This calculation has not been validated in all clinical situations.     eGFR's persistently <90 mL/min signify possible Chronic Kidney     Disease.  CBC     Status: Abnormal   Collection Time    07/07/13  5:26 AM      Result Value Range   WBC 7.8  4.0 - 10.5 K/uL   RBC 2.14 (*) 4.22 - 5.81 MIL/uL   Hemoglobin 7.7 (*) 13.0 -  17.0 g/dL   HCT 22.7 (*) 39.0 - 52.0 %   MCV 106.1 (*) 78.0 - 100.0 fL   MCH 36.0 (*) 26.0 - 34.0 pg   MCHC 33.9  30.0 - 36.0 g/dL   RDW 14.8  11.5 - 15.5 %   Platelets 156  150 - 400 K/uL  BASIC METABOLIC PANEL     Status: Abnormal   Collection Time    07/07/13  5:26 AM      Result Value Range   Sodium 134 (*) 137 - 147 mEq/L   Potassium 4.2  3.7 - 5.3 mEq/L   Chloride 101  96 - 112 mEq/L   CO2 23  19 - 32 mEq/L   Glucose, Bld 136 (*) 70 - 99 mg/dL   BUN 15  6 - 23 mg/dL   Creatinine, Ser 1.27  0.50 - 1.35 mg/dL   Calcium 7.9 (*) 8.4 - 10.5 mg/dL   GFR calc non Af  Amer 56 (*) >90 mL/min   GFR calc Af Amer 65 (*) >90 mL/min   Comment: (NOTE)     The eGFR has been calculated using the CKD EPI equation.     This calculation has not been validated in all clinical situations.     eGFR's persistently <90 mL/min signify possible Chronic Kidney     Disease.    Radiology/Results: Ct Abdomen Pelvis W Contrast  07/07/2013   CLINICAL DATA:  History of rectal carcinoma. Status post recent ileostomy take-down. Positive blood cultures. Normal white blood cell count. Patient was chills and fever. Patient has positive blood cultures.  EXAM: CT ABDOMEN AND PELVIS WITH CONTRAST  TECHNIQUE: Multidetector CT imaging of the abdomen and pelvis was performed using the standard protocol following bolus administration of intravenous contrast.  CONTRAST:  25m OMNIPAQUE IOHEXOL 300 MG/ML SOLN, 1031mOMNIPAQUE IOHEXOL 300 MG/ML SOLN  COMPARISON:  08/02/2012  FINDINGS: There is irregular fluid attenuation associated with hazy and reticular inflammatory change in the subcutaneous soft tissues of the right lower quadrant. The irregular fluid measures approximately 3.6 cm x 2.2 cm x 0.5 cm. This may reflect a small area of sterile postoperative fluid with associated edema. An early abscess is possible.  There is presacral edema and mild stranding in the fat of the pelvis that is all felt to be post surgical in origin.  There is no intra-abdominal collection/abscess.  The appendix is dilated to 9 mm. There is no significant associated inflammatory change. This could potentially reflect an early acute appendicitis, but is not conclusive.  The ileal anastomosis in the right lower quadrant is unremarkable. No bowel wall thickening or inflammatory change. No bowel dilation to suggest obstruction or significant ileus.  Minimal lung base subsegmental atelectasis. Lung bases are otherwise clear.  Liver and spleen are unremarkable.  Multiple gallstones. No evidence of acute cholecystitis. No bile duct dilation. Normal pancreas. No adrenal masses.  Small renal low-density lesions, likely cysts. Kidneys are otherwise unremarkable. Normal ureters. Normal bladder. Prostate is enlarged but stable.  No pathologically enlarged lymph nodes.  No ascites.  IMPRESSION: 1. There are 2 potential sources for positive blood cultures. 2. There is a small irregular fluid collection within the subcutaneous fat of the right lower quadrant that could reflect a poorly defined abscess with associated cellulitis. It may simply reflect sterile postoperative fluid and edema. 3. The appendix is dilated 9 mm. This could reflect an early acute appendicitis but is not conclusive. 4. There is no intra-abdominal or pelvic abscess. Mild pelvic fat stranding and presacral edema is consistent with the expected postsurgical change. 5. No evidence of bowel inflammation or obstruction. No adynamic ileus. 6. Other chronic findings as described are stable from the prior CT.   Electronically Signed   By: DaLajean Manes.D.   On: 07/07/2013 09:00   Dg Abd Acute W/chest  07/05/2013   CLINICAL DATA:  Fever after recent ileostomy take-down  EXAM: ACUTE ABDOMEN SERIES (ABDOMEN 2 VIEW & CHEST 1 VIEW)  COMPARISON:  08/02/2012 chest CT  FINDINGS: Normal heart size and mediastinal contours. No acute infiltrate or edema. No effusion or pneumothorax. There is minimal atelectasis or scar  at the peripheral left base. Previously noted pulmonary nodules are not visible. No acute osseous findings.  No gas dilated bowel. No abnormal fluid levels. Stool is present within the proximal colon, reassuring finding after ileostomy takedown. Negative for pneumoperitoneum.  IMPRESSION: Negative abdominal radiographs.  No evidence of pneumonia.   Electronically Signed  By: Jorje Guild M.D.   On: 07/05/2013 23:28    Anti-infectives: Anti-infectives   Start     Dose/Rate Route Frequency Ordered Stop   07/07/13 0800  vancomycin (VANCOCIN) IVPB 1000 mg/200 mL premix     1,000 mg 200 mL/hr over 60 Minutes Intravenous Every 12 hours 07/06/13 1910     07/06/13 1930  piperacillin-tazobactam (ZOSYN) IVPB 3.375 g     3.375 g 12.5 mL/hr over 240 Minutes Intravenous 3 times per day 07/06/13 1910     07/06/13 1930  vancomycin (VANCOCIN) 1,500 mg in sodium chloride 0.9 % 500 mL IVPB     1,500 mg 250 mL/hr over 120 Minutes Intravenous  Once 07/06/13 1910 07/06/13 2248      Assessment/Plan: Problem List: Patient Active Problem List   Diagnosis Date Noted  . Bacteremia 07/06/2013  . Stricture of coloanal anastomosis 04/17/2013  . Ileostomy - diverting loop - s/p takedown 06/28/2013 01/14/2013  . Anemia in neoplastic disease 01/14/2013  . Bilateral inguinal hernia (BIH) 01/03/2013  . Mitral regurgitation due to partially flail posterior mitral valve leaflet 07/30/2012  . Hyperlipidemia   . Rectal cancer - distal s/p lap LAR/coloanal July 2014 - [id number]. 07/27/2012  . Hypertension   . Vertigo     CT results pending.  Will look for drainable abscess that may be producing bacteremia.   * No surgery found *    LOS: 1 day   Matt B. Hassell Done, MD, [facility] [phone] beeper [phone]  07/07/2013 9:06 AM

## 2013-07-08 LAB — CULTURE, BLOOD (ROUTINE X 2)

## 2013-07-08 MED ORDER — FERROUS SULFATE 325 (65 FE) MG PO TABS
325.0000 mg | ORAL_TABLET | Freq: Three times a day (TID) | ORAL | Status: DC
  Administered 2013-07-08 – 2013-07-11 (×8): 325 mg via ORAL
  Filled 2013-07-08 (×11): qty 1

## 2013-07-08 MED ORDER — SODIUM CHLORIDE 0.9 % IJ SOLN
3.0000 mL | Freq: Two times a day (BID) | INTRAMUSCULAR | Status: DC
  Administered 2013-07-09: 3 mL via INTRAVENOUS

## 2013-07-08 MED ORDER — VITAMIN C 500 MG PO TABS
500.0000 mg | ORAL_TABLET | Freq: Two times a day (BID) | ORAL | Status: DC
  Administered 2013-07-08 – 2013-07-11 (×6): 500 mg via ORAL
  Filled 2013-07-08 (×8): qty 1

## 2013-07-08 MED ORDER — LIP MEDEX EX OINT
TOPICAL_OINTMENT | CUTANEOUS | Status: AC
  Administered 2013-07-08: 1 via TOPICAL
  Filled 2013-07-08: qty 7

## 2013-07-08 MED ORDER — ALUM & MAG HYDROXIDE-SIMETH 200-200-20 MG/5ML PO SUSP: 30.0000 mL | Freq: Four times a day (QID) | ORAL | Status: DC | PRN

## 2013-07-08 MED ORDER — SODIUM CHLORIDE 0.9 % IJ SOLN: 3.0000 mL | INTRAMUSCULAR | Status: DC | PRN

## 2013-07-08 MED ORDER — MAGIC MOUTHWASH
15.0000 mL | Freq: Four times a day (QID) | ORAL | Status: DC | PRN
  Filled 2013-07-08: qty 15

## 2013-07-08 MED ORDER — SODIUM CHLORIDE 0.9 % IV SOLN
3.0000 g | Freq: Four times a day (QID) | INTRAVENOUS | Status: DC
  Administered 2013-07-08 – 2013-07-10 (×8): 3 g via INTRAVENOUS
  Filled 2013-07-08 (×9): qty 3

## 2013-07-08 MED ORDER — LACTATED RINGERS IV BOLUS (SEPSIS): 1000.0000 mL | Freq: Three times a day (TID) | INTRAVENOUS | Status: AC | PRN

## 2013-07-08 MED ORDER — PROMETHAZINE HCL 25 MG/ML IJ SOLN: 6.2500 mg | Freq: Four times a day (QID) | INTRAMUSCULAR | Status: DC | PRN

## 2013-07-08 MED ORDER — ONDANSETRON HCL 4 MG PO TABS: 4.0000 mg | ORAL_TABLET | Freq: Four times a day (QID) | ORAL | Status: DC | PRN

## 2013-07-08 MED ORDER — HEPARIN SODIUM (PORCINE) 5000 UNIT/ML IJ SOLN
5000.0000 [IU] | Freq: Three times a day (TID) | INTRAMUSCULAR | Status: DC
  Administered 2013-07-08 – 2013-07-09 (×2): 5000 [IU] via SUBCUTANEOUS
  Filled 2013-07-08 (×5): qty 1

## 2013-07-08 MED ORDER — METOPROLOL TARTRATE 1 MG/ML IV SOLN
5.0000 mg | Freq: Four times a day (QID) | INTRAVENOUS | Status: DC | PRN
  Filled 2013-07-08: qty 5

## 2013-07-08 MED ORDER — SODIUM CHLORIDE 0.9 % IJ SOLN
3.0000 mL | Freq: Two times a day (BID) | INTRAMUSCULAR | Status: DC
  Administered 2013-07-08 – 2013-07-09 (×2): 3 mL via INTRAVENOUS

## 2013-07-08 MED ORDER — DIPHENHYDRAMINE HCL 50 MG/ML IJ SOLN: 12.5000 mg | Freq: Four times a day (QID) | INTRAMUSCULAR | Status: DC | PRN

## 2013-07-08 MED ORDER — LIP MEDEX EX OINT
1.0000 "application " | TOPICAL_OINTMENT | Freq: Two times a day (BID) | CUTANEOUS | Status: DC
  Administered 2013-07-08 – 2013-07-11 (×4): 1 via TOPICAL
  Filled 2013-07-08: qty 7

## 2013-07-08 NOTE — Progress Notes (Signed)
The patient feeling better.  Tolerating full liquids.  Having bowel movements.  Challenged to feel sensation.  Trying Koegel of the floor exercises.  General: Pt awake/alert/oriented x4 in no major acute distress Eyes: PERRL, normal EOM. Sclera nonicteric Neuro: CN II-XII intact w/o focal sensory/motor deficits. Lymph: No head/neck/groin lymphadenopathy Psych:  No delerium/psychosis/paranoia HENT: Normocephalic, Mucus membranes moist.  No thrush Neck: Supple, No tracheal deviation Chest: No pain.  Good respiratory excursion. CV:  Pulses intact.  Regular rhythm MS: Normal AROM mjr joints.  No obvious deformity Abdomen: Soft, Nondistended.  Ileostomy incision closed and well healed.  No evidence of cellulitis or pain. Nontender.  No incarcerated hernias. Ext:  SCDs BLE.  No significant edema.  No cyanosis Skin: No petechiae / purpura     Agree with workup for bacteremia.  Repeat blood cultures (just drawn again).  Evaluate to rule out endocarditis.  Patient concerned about having another PICC line placed since last one in RUE caused a DVT.  We will try to avoid that and look for alternatives.  We will discuss with infectious disease.  They probably cannot give final recommendations until workup is complete.

## 2013-07-08 NOTE — Care Management Note (Addendum)
    Page 1 of 1   07/10/2013     1:45:14 PM   CARE MANAGEMENT NOTE 07/10/2013  Patient:  Tyler Diaz   Account Number:  [id number]  Date Initiated:  07/08/2013  Documentation initiated by:  Sunday Spillers  Subjective/Objective Assessment:   68 yo male admitted with bacteremia. PTA lived at home with spouse.     Action/Plan:   Home when stable   Anticipated DC Date:  07/12/2013   Anticipated DC Plan:  [place]  CM consult      Choice offered to / List presented to:             Status of service:  Completed, signed off Medicare Important Message given?   (If response is "NO", the following Medicare IM given date fields will be blank) Date Medicare IM given:   Date Additional Medicare IM given:    Discharge Disposition:  HOME/SELF CARE  Per UR Regulation:  Reviewed for med. necessity/level of care/duration of stay  If discussed at [place] of Stay Meetings, dates discussed:    Comments:  07-10-13 [place] [phone] Spoke with patient and spouse at bedside after TEE. Patient has not decided on PICC and abx yet, wants to speak with Dr. Johney Maine and/or Dr. Benay Spice. Has used [facility] in the past for [facility] services and if decides to go forward with the IV abx and PICC placement he would like to use them again. Will f/u with patient once decision is made.

## 2013-07-08 NOTE — Progress Notes (Signed)
Patient ID: Tyler Diaz, male   DOB: [birth year], 68 y.o.   MRN: [id number]  Subjective: Pt reports dark tarry stools prior to admission, reports runny BM today.  No n.v.  C/o right sided abdominal pain, he was treated for zoster recently.   Objective:  Vital signs:  Filed Vitals:   07/07/13 1400 07/07/13 1800 07/07/13 2152 07/08/13 0520  BP: 95/59 98/57 103/56 93/52  Pulse: 90 92 92 86  Temp: 98.1 F (36.7 C) 98.6 F (37 C) 98.1 F (36.7 C) 97.9 F (36.6 C)  TempSrc: Oral Oral Oral Oral  Resp: 18 18 18 18   Height:      Weight:      SpO2: 95% 97% 96% 93%    Last BM Date: 07/06/13  Intake/Output   Yesterday:  02/01 0701 - 02/02 0700 In: 2830 [P.O.:480; I.V.:1800; IV Piggyback:550] Out: 3200 [Urine:3200] This shift:  Total I/O In: 740 [P.O.:240; I.V.:300; IV Piggyback:200] Out: 500 [Urine:500]    Physical Exam: General: Pt awake/alert/oriented x3 in no acute distress Chest: CTA.  No chest wall pain w good excursion CV:  s1s2 rrr 3/6 SEM best appreciated left sternal border 4th and 5th ICS. MS: Normal AROM mjr joints.  No obvious deformity Abdomen: Soft.  Nondistended.  Incision is without erythema, there is a palpable non fluctuant area at incision without drainage.  There is a healing rash to right abdomen and flank follows a dermatomal path. Ext:  SCDs BLE.  No mjr edema.  No cyanosis Skin: No petechiae / purpura   Problem List:   Active Problems:   Bacteremia    Results:   Labs: Results for orders placed during the hospital encounter of 07/06/13 (from the past 48 hour(s))  CBC     Status: Abnormal   Collection Time    07/06/13  8:30 PM      Result Value Range   WBC 9.4  4.0 - 10.5 K/uL   RBC 2.54 (*) 4.22 - 5.81 MIL/uL   Hemoglobin 9.0 (*) 13.0 - 17.0 g/dL   HCT 27.3 (*) 39.0 - 52.0 %   MCV 107.5 (*) 78.0 - 100.0 fL   MCH 35.4 (*) 26.0 - 34.0 pg   MCHC 33.0  30.0 - 36.0 g/dL   RDW 14.9  11.5 - 15.5 %   Platelets 190  150 - 400 K/uL  CREATININE,  SERUM     Status: Abnormal   Collection Time    07/06/13  8:30 PM      Result Value Range   Creatinine, Ser 1.32  0.50 - 1.35 mg/dL   GFR calc non Af Amer 53 (*) >90 mL/min   GFR calc Af Amer 62 (*) >90 mL/min   Comment: (NOTE)     The eGFR has been calculated using the CKD EPI equation.     This calculation has not been validated in all clinical situations.     eGFR's persistently <90 mL/min signify possible Chronic Kidney     Disease.  CBC     Status: Abnormal   Collection Time    07/07/13  5:26 AM      Result Value Range   WBC 7.8  4.0 - 10.5 K/uL   RBC 2.14 (*) 4.22 - 5.81 MIL/uL   Hemoglobin 7.7 (*) 13.0 - 17.0 g/dL   HCT 22.7 (*) 39.0 - 52.0 %   MCV 106.1 (*) 78.0 - 100.0 fL   MCH 36.0 (*) 26.0 - 34.0 pg   MCHC 33.9  30.0 -  36.0 g/dL   RDW 14.8  11.5 - 15.5 %   Platelets 156  150 - 400 K/uL  BASIC METABOLIC PANEL     Status: Abnormal   Collection Time    07/07/13  5:26 AM      Result Value Range   Sodium 134 (*) 137 - 147 mEq/L   Potassium 4.2  3.7 - 5.3 mEq/L   Chloride 101  96 - 112 mEq/L   CO2 23  19 - 32 mEq/L   Glucose, Bld 136 (*) 70 - 99 mg/dL   BUN 15  6 - 23 mg/dL   Creatinine, Ser 1.27  0.50 - 1.35 mg/dL   Calcium 7.9 (*) 8.4 - 10.5 mg/dL   GFR calc non Af Amer 56 (*) >90 mL/min   GFR calc Af Amer 65 (*) >90 mL/min   Comment: (NOTE)     The eGFR has been calculated using the CKD EPI equation.     This calculation has not been validated in all clinical situations.     eGFR's persistently <90 mL/min signify possible Chronic Kidney     Disease.    Imaging / Studies: Ct Abdomen Pelvis W Contrast  07/07/2013   CLINICAL DATA:  History of rectal carcinoma. Status post recent ileostomy take-down. Positive blood cultures. Normal white blood cell count. Patient was chills and fever. Patient has positive blood cultures.  EXAM: CT ABDOMEN AND PELVIS WITH CONTRAST  TECHNIQUE: Multidetector CT imaging of the abdomen and pelvis was performed using the standard protocol  following bolus administration of intravenous contrast.  CONTRAST:  53m OMNIPAQUE IOHEXOL 300 MG/ML SOLN, 1025mOMNIPAQUE IOHEXOL 300 MG/ML SOLN  COMPARISON:  08/02/2012  FINDINGS: There is irregular fluid attenuation associated with hazy and reticular inflammatory change in the subcutaneous soft tissues of the right lower quadrant. The irregular fluid measures approximately 3.6 cm x 2.2 cm x 0.5 cm. This may reflect a small area of sterile postoperative fluid with associated edema. An early abscess is possible.  There is presacral edema and mild stranding in the fat of the pelvis that is all felt to be post surgical in origin. There is no intra-abdominal collection/abscess.  The appendix is dilated to 9 mm. There is no significant associated inflammatory change. This could potentially reflect an early acute appendicitis, but is not conclusive.  The ileal anastomosis in the right lower quadrant is unremarkable. No bowel wall thickening or inflammatory change. No bowel dilation to suggest obstruction or significant ileus.  Minimal lung base subsegmental atelectasis. Lung bases are otherwise clear.  Liver and spleen are unremarkable.  Multiple gallstones. No evidence of acute cholecystitis. No bile duct dilation. Normal pancreas. No adrenal masses.  Small renal low-density lesions, likely cysts. Kidneys are otherwise unremarkable. Normal ureters. Normal bladder. Prostate is enlarged but stable.  No pathologically enlarged lymph nodes.  No ascites.  IMPRESSION: 1. There are 2 potential sources for positive blood cultures. 2. There is a small irregular fluid collection within the subcutaneous fat of the right lower quadrant that could reflect a poorly defined abscess with associated cellulitis. It may simply reflect sterile postoperative fluid and edema. 3. The appendix is dilated 9 mm. This could reflect an early acute appendicitis but is not conclusive. 4. There is no intra-abdominal or pelvic abscess. Mild pelvic  fat stranding and presacral edema is consistent with the expected postsurgical change. 5. No evidence of bowel inflammation or obstruction. No adynamic ileus. 6. Other chronic findings as described are stable from the prior  CT.   Electronically Signed   By: Lajean Manes M.D.   On: 07/07/2013 09:00    Medications / Allergies: per chart  Antibiotics: Anti-infectives   Start     Dose/Rate Route Frequency Ordered Stop   07/08/13 1400  Ampicillin-Sulbactam (UNASYN) 3 g in sodium chloride 0.9 % 100 mL IVPB     3 g 100 mL/hr over 60 Minutes Intravenous Every 6 hours 07/08/13 1250     07/07/13 0800  vancomycin (VANCOCIN) IVPB 1000 mg/200 mL premix  Status:  Discontinued     1,000 mg 200 mL/hr over 60 Minutes Intravenous Every 12 hours 07/06/13 1910 07/08/13 1250   07/06/13 1930  piperacillin-tazobactam (ZOSYN) IVPB 3.375 g  Status:  Discontinued     3.375 g 12.5 mL/hr over 240 Minutes Intravenous 3 times per day 07/06/13 1910 07/08/13 1250   07/06/13 1930  vancomycin (VANCOCIN) 1,500 mg in sodium chloride 0.9 % 500 mL IVPB     1,500 mg 250 mL/hr over 120 Minutes Intravenous  Once 07/06/13 1910 07/06/13 2248      Assessment/Plan Hx resection of rectal cancer with coloanal anastomosis and diverting loop ileostomy S/P ileostomy takedown Dr. Johney Maine 06/28/13 Enterococcal bacteremia  Change to unasyn per ID recommendations Repeat blood cultures(if PICC line cannot be placed if BCs are positive) Will discuss with Dr. Johney Maine regarding TEE(cardiologist is Dr. Wynonia Lawman) Will discuss with attending if subq collection needs to be aspirated Advance diet Repeat CBC in AM Mobilize Pain control VTE prophylaxis  Post herpetic neuralgia  -abdominal pain follows a dermatomal pattern and pattern of rash. Anemia -stable.  Continue to monitor  Erby Pian, [facility] Surgery Pager [phone] Office [phone]  07/08/2013 1:47 PM

## 2013-07-08 NOTE — Progress Notes (Signed)
Treatment for (amp S)enterococcal bacteremia:  - recommend to treat with ampicillin or unasyn for treatment. Can discontinue pip/tazo and vancomycin.  - recommend to repeat blood cultures to ensure clearance of bacteremia - recommend to get TTE to ensure no evidence of endocarditis - likely will need a minimum of 2 wks of abtx to treat bacteremia - will provide further recs through formal consult  Seaira Byus [facility] [place] [facility] [phone]

## 2013-07-09 LAB — TYPE AND SCREEN
ABO/RH(D): A POS
Antibody Screen: NEGATIVE

## 2013-07-09 LAB — CBC
HCT: 21.8 % — ABNORMAL LOW (ref 39.0–52.0)
HEMOGLOBIN: 7.2 g/dL — AB (ref 13.0–17.0)
MCH: 35 pg — AB (ref 26.0–34.0)
MCHC: 33 g/dL (ref 30.0–36.0)
MCV: 105.8 fL — ABNORMAL HIGH (ref 78.0–100.0)
Platelets: 184 10*3/uL (ref 150–400)
RBC: 2.06 MIL/uL — ABNORMAL LOW (ref 4.22–5.81)
RDW: 14.4 % (ref 11.5–15.5)
WBC: 4.8 10*3/uL (ref 4.0–10.5)

## 2013-07-09 LAB — POTASSIUM: POTASSIUM: 3.6 meq/L — AB (ref 3.7–5.3)

## 2013-07-09 LAB — CREATININE, SERUM
Creatinine, Ser: 1.07 mg/dL (ref 0.50–1.35)
GFR calc Af Amer: 80 mL/min — ABNORMAL LOW (ref >90)
GFR calc non Af Amer: 69 mL/min — ABNORMAL LOW (ref >90)

## 2013-07-09 MED ORDER — FOLIC ACID 1 MG PO TABS
1.0000 mg | ORAL_TABLET | Freq: Every day | ORAL | Status: DC
  Administered 2013-07-09 – 2013-07-11 (×2): 1 mg via ORAL
  Filled 2013-07-09 (×3): qty 1

## 2013-07-09 MED ORDER — VITAMIN B-12 1000 MCG PO TABS
1000.0000 ug | ORAL_TABLET | Freq: Every day | ORAL | Status: DC
  Administered 2013-07-09 – 2013-07-11 (×2): 1000 ug via ORAL
  Filled 2013-07-09 (×3): qty 1

## 2013-07-09 NOTE — Progress Notes (Signed)
Patient eating better.  Some melena but improving.  TEE delayed due to scheduling issues.  Hopefully we will be in the morning.  Blood cultures negative so far.  Oblique a transition to oral Augmentin for 2 weeks instead.  Awaiting final infectious disease consult.  D/C patient from hospital when patient meets criteria (anticipate in 1-2 day(s)):  Tolerating oral intake well Ambulating in walkways Adequate pain control without IV medications Urinating  Having flatus

## 2013-07-09 NOTE — Progress Notes (Signed)
Patient ID: Tyler Diaz, male   DOB: 1945-11-12, 68 y.o.   MRN: [id number]  CARE TEAM:  PCP: Redge Gainer, MD  Outpatient Care Team: Patient Care Team: Chipper Herb, MD as PCP - General (Family Medicine) Adin Hector, MD as Consulting Physician (Colon and Rectal Surgery) Lear Ng, MD as Consulting Physician (Gastroenterology) Gatha Mayer, MD as Consulting Physician (Radiation Oncology) Carola Frost, RN as Registered Nurse (Oncology) [name], MD as Consulting Physician (Oncology)  Inpatient Treatment Team: Treatment Team: Attending Provider: Adin Hector, MD; Technician: Rondel Jumbo, NT; Rounding Team: Nolon Nations, MD; Respiratory Therapist: Lissa Merlin, RRT; Registered Nurse: Roma Schanz, RN   Subjective: Reports "chicken broth stools with splashes of dark."  Hemoglobin and hematocrit are down.  He is afebrile.  White count remains normal.  Hemodynamically normal.  He is ambulating in hallways.  Has mild pain to right abdomen, no changes.  No n/v.  No hematochezia.    Objective:  Vital signs:  Filed Vitals:   07/08/13 0520 07/08/13 1412 07/08/13 2200 07/09/13 0600  BP: 93/52 102/60 106/64 103/62  Pulse: 86 80 87 86  Temp: 97.9 F (36.6 C) 97.3 F (36.3 C) 97.9 F (36.6 C) 98.1 F (36.7 C)  TempSrc: Oral Oral Oral Oral  Resp: 18 18 16 16   Height:      Weight:      SpO2: 93% 97% 97% 97%    Last BM Date: 07/08/13  Intake/Output   Yesterday:  02/02 0701 - 02/03 0700 In: 1640 [P.O.:840; I.V.:300; IV Piggyback:500] Out: 1850 [Urine:1850] This shift:    I/O last 3 completed shifts: In: 8366 [P.O.:1320; I.V.:1200; IV Piggyback:1050] Out: 3250 [Urine:3250]   Physical Exam:  General: Pt awake/alert/oriented x3 in no acute distress  Chest: CTA. No chest wall pain w good excursion  CV: s1s2 rrr 3/6 SEM best appreciated left sternal border 4th and 5th ICS.  MS: Normal AROM mjr joints. No obvious deformity  Abdomen: Soft.  Nondistended. Incision is without erythema, there is a palpable non fluctuant area at incision without drainage. There is a healing rash to right abdomen and flank follows a dermatomal path.  Ext: SCDs BLE. No mjr edema. No cyanosis  Skin: No petechiae / purpura  Problem List:   Active Problems:   Bacteremia    Results:   Labs: Results for orders placed during the hospital encounter of 07/06/13 (from the past 48 hour(s))  CULTURE, BLOOD (ROUTINE X 2)     Status: None   Collection Time    07/08/13  1:20 PM      Result Value Range   Specimen Description BLOOD RIGHT ARM     Special Requests BOTTLES DRAWN AEROBIC AND ANAEROBIC 10CC     Culture  Setup Time       Value: 07/08/2013 16:37     Performed at [facility]   Culture       Value:        BLOOD CULTURE RECEIVED NO GROWTH TO DATE CULTURE WILL BE HELD FOR 5 DAYS BEFORE ISSUING A FINAL NEGATIVE REPORT     Performed at [facility]   Report Status PENDING    CULTURE, BLOOD (ROUTINE X 2)     Status: None   Collection Time    07/08/13  1:25 PM      Result Value Range   Specimen Description BLOOD RIGHT HAND     Special Requests BOTTLES DRAWN AEROBIC AND ANAEROBIC  10CC     Culture  Setup Time       Value: 07/08/2013 16:37     Performed at [facility]   Culture       Value:        BLOOD CULTURE RECEIVED NO GROWTH TO DATE CULTURE WILL BE HELD FOR 5 DAYS BEFORE ISSUING A FINAL NEGATIVE REPORT     Performed at [facility]   Report Status PENDING    CBC     Status: Abnormal   Collection Time    07/09/13  5:05 AM      Result Value Range   WBC 4.8  4.0 - 10.5 K/uL   RBC 2.06 (*) 4.22 - 5.81 MIL/uL   Hemoglobin 7.2 (*) 13.0 - 17.0 g/dL   HCT 21.8 (*) 39.0 - 52.0 %   MCV 105.8 (*) 78.0 - 100.0 fL   MCH 35.0 (*) 26.0 - 34.0 pg   MCHC 33.0  30.0 - 36.0 g/dL   RDW 14.4  11.5 - 15.5 %   Platelets 184  150 - 400 K/uL  POTASSIUM     Status: Abnormal   Collection Time    07/09/13  5:05 AM       Result Value Range   Potassium 3.6 (*) 3.7 - 5.3 mEq/L  CREATININE, SERUM     Status: Abnormal   Collection Time    07/09/13  5:05 AM      Result Value Range   Creatinine, Ser 1.07  0.50 - 1.35 mg/dL   GFR calc non Af Amer 69 (*) >90 mL/min   GFR calc Af Amer 80 (*) >90 mL/min   Comment: (NOTE)     The eGFR has been calculated using the CKD EPI equation.     This calculation has not been validated in all clinical situations.     eGFR's persistently <90 mL/min signify possible Chronic Kidney     Disease.    Imaging / Studies: Ct Abdomen Pelvis W Contrast  07/07/2013   CLINICAL DATA:  History of rectal carcinoma. Status post recent ileostomy take-down. Positive blood cultures. Normal white blood cell count. Patient was chills and fever. Patient has positive blood cultures.  EXAM: CT ABDOMEN AND PELVIS WITH CONTRAST  TECHNIQUE: Multidetector CT imaging of the abdomen and pelvis was performed using the standard protocol following bolus administration of intravenous contrast.  CONTRAST:  21m OMNIPAQUE IOHEXOL 300 MG/ML SOLN, 1073mOMNIPAQUE IOHEXOL 300 MG/ML SOLN  COMPARISON:  08/02/2012  FINDINGS: There is irregular fluid attenuation associated with hazy and reticular inflammatory change in the subcutaneous soft tissues of the right lower quadrant. The irregular fluid measures approximately 3.6 cm x 2.2 cm x 0.5 cm. This may reflect a small area of sterile postoperative fluid with associated edema. An early abscess is possible.  There is presacral edema and mild stranding in the fat of the pelvis that is all felt to be post surgical in origin. There is no intra-abdominal collection/abscess.  The appendix is dilated to 9 mm. There is no significant associated inflammatory change. This could potentially reflect an early acute appendicitis, but is not conclusive.  The ileal anastomosis in the right lower quadrant is unremarkable. No bowel wall thickening or inflammatory change. No bowel dilation to  suggest obstruction or significant ileus.  Minimal lung base subsegmental atelectasis. Lung bases are otherwise clear.  Liver and spleen are unremarkable.  Multiple gallstones. No evidence of acute cholecystitis. No bile duct dilation. Normal pancreas. No adrenal masses.  Small renal low-density lesions, likely cysts. Kidneys are otherwise unremarkable. Normal ureters. Normal bladder. Prostate is enlarged but stable.  No pathologically enlarged lymph nodes.  No ascites.  IMPRESSION: 1. There are 2 potential sources for positive blood cultures. 2. There is a small irregular fluid collection within the subcutaneous fat of the right lower quadrant that could reflect a poorly defined abscess with associated cellulitis. It may simply reflect sterile postoperative fluid and edema. 3. The appendix is dilated 9 mm. This could reflect an early acute appendicitis but is not conclusive. 4. There is no intra-abdominal or pelvic abscess. Mild pelvic fat stranding and presacral edema is consistent with the expected postsurgical change. 5. No evidence of bowel inflammation or obstruction. No adynamic ileus. 6. Other chronic findings as described are stable from the prior CT.   Electronically Signed   By: Lajean Manes M.D.   On: 07/07/2013 09:00    Medications / Allergies: per chart  Antibiotics: Anti-infectives   Start     Dose/Rate Route Frequency Ordered Stop   07/08/13 1400  Ampicillin-Sulbactam (UNASYN) 3 g in sodium chloride 0.9 % 100 mL IVPB     3 g 100 mL/hr over 60 Minutes Intravenous Every 6 hours 07/08/13 1250     07/07/13 0800  vancomycin (VANCOCIN) IVPB 1000 mg/200 mL premix  Status:  Discontinued     1,000 mg 200 mL/hr over 60 Minutes Intravenous Every 12 hours 07/06/13 1910 07/08/13 1250   07/06/13 1930  piperacillin-tazobactam (ZOSYN) IVPB 3.375 g  Status:  Discontinued     3.375 g 12.5 mL/hr over 240 Minutes Intravenous 3 times per day 07/06/13 1910 07/08/13 1250   07/06/13 1930  vancomycin  (VANCOCIN) 1,500 mg in sodium chloride 0.9 % 500 mL IVPB     1,500 mg 250 mL/hr over 120 Minutes Intravenous  Once 07/06/13 1910 07/06/13 2248      Assessment/Plan  Hx resection of rectal cancer with coloanal anastomosis and diverting loop ileostomy  S/P ileostomy takedown Dr. Johney Maine 06/28/13  Enterococcal bacteremia  Changed to Unasyn 2/2 per ID recs for a minimum of 2 weeks.  He is against PICC line placement due to a DVT to extremity with last PICC line placement.  We will await TEE and final blood cultures to seek alternatives to PICC line with IDs assistance.  If no other options are available, will discuss with hematology regarding anticoagulation therapy while he has the PICC line.   Blood cultures repeated 2/2--negative to date TEE tomorrow.  Discussed with cardmaster who will schedule the patient for tomorrow at [facility]. Heart healthy diet, NPO p MN for TEE Mobilize  Pain control  Post herpetic neuralgia  -pain control Anemia  -stop heparin -type and screen -repeat H&H in AM.    Erby Pian, [facility] Surgery Pager [phone] Office [phone]  07/09/2013 8:48 AM

## 2013-07-10 ENCOUNTER — Encounter (HOSPITAL_COMMUNITY)
Admission: AD | Disposition: A | Discharge status: Home Health | Payer: Self-pay | Source: Ambulatory Visit | Attending: Surgery

## 2013-07-10 ENCOUNTER — Encounter (HOSPITAL_COMMUNITY): Payer: Self-pay | Admitting: *Deleted

## 2013-07-10 DIAGNOSIS — R7881 Bacteremia: Secondary | ICD-10-CM

## 2013-07-10 DIAGNOSIS — Z85048 Personal history of other malignant neoplasm of rectum, rectosigmoid junction, and anus: Secondary | ICD-10-CM

## 2013-07-10 DIAGNOSIS — I059 Rheumatic mitral valve disease, unspecified: Secondary | ICD-10-CM

## 2013-07-10 DIAGNOSIS — B952 Enterococcus as the cause of diseases classified elsewhere: Secondary | ICD-10-CM

## 2013-07-10 HISTORY — PX: TEE WITHOUT CARDIOVERSION: SHX5443

## 2013-07-10 LAB — CREATININE, SERUM
CREATININE: 1.11 mg/dL (ref 0.50–1.35)
GFR calc Af Amer: 76 mL/min — ABNORMAL LOW (ref >90)
GFR, EST NON AFRICAN AMERICAN: 66 mL/min — AB (ref >90)

## 2013-07-10 LAB — HEMOGLOBIN AND HEMATOCRIT, BLOOD
HCT: 24.7 % — ABNORMAL LOW (ref 39.0–52.0)
Hemoglobin: 8.2 g/dL — ABNORMAL LOW (ref 13.0–17.0)

## 2013-07-10 LAB — POTASSIUM: Potassium: 4.1 mEq/L (ref 3.7–5.3)

## 2013-07-10 SURGERY — ECHOCARDIOGRAM, TRANSESOPHAGEAL
Anesthesia: Moderate Sedation

## 2013-07-10 MED ORDER — GENTAMICIN IN SALINE 1.6-0.9 MG/ML-% IV SOLN
80.0000 mg | Freq: Three times a day (TID) | INTRAVENOUS | Status: DC
  Administered 2013-07-10 – 2013-07-11 (×4): 80 mg via INTRAVENOUS
  Filled 2013-07-10 (×5): qty 50

## 2013-07-10 MED ORDER — FENTANYL CITRATE 0.05 MG/ML IJ SOLN
INTRAMUSCULAR | Status: AC
  Filled 2013-07-10: qty 2

## 2013-07-10 MED ORDER — BUTAMBEN-TETRACAINE-BENZOCAINE 2-2-14 % EX AERO
INHALATION_SPRAY | CUTANEOUS | Status: DC | PRN
  Administered 2013-07-10: 2 via TOPICAL

## 2013-07-10 MED ORDER — MIDAZOLAM HCL 5 MG/ML IJ SOLN
INTRAMUSCULAR | Status: AC
  Filled 2013-07-10: qty 2

## 2013-07-10 MED ORDER — GENTAMICIN SULFATE 40 MG/ML IJ SOLN
1.0000 mg/kg | Freq: Three times a day (TID) | INTRAMUSCULAR | Status: DC
  Filled 2013-07-10 (×2): qty 2

## 2013-07-10 MED ORDER — AMPICILLIN SODIUM 2 G IJ SOLR
2.0000 g | INTRAMUSCULAR | Status: DC
  Administered 2013-07-10 – 2013-07-11 (×7): 2 g via INTRAVENOUS
  Filled 2013-07-10 (×15): qty 2000

## 2013-07-10 MED ORDER — SODIUM CHLORIDE 0.9 % IV SOLN
Freq: Once | INTRAVENOUS | Status: AC
  Administered 2013-07-10: 11:00:00 via INTRAVENOUS

## 2013-07-10 MED ORDER — FENTANYL CITRATE 0.05 MG/ML IJ SOLN
INTRAMUSCULAR | Status: DC | PRN
  Administered 2013-07-10 (×3): 25 ug via INTRAVENOUS

## 2013-07-10 MED ORDER — MIDAZOLAM HCL 10 MG/2ML IJ SOLN
INTRAMUSCULAR | Status: DC | PRN
  Administered 2013-07-10: 2 mg via INTRAVENOUS
  Administered 2013-07-10 (×3): 1 mg via INTRAVENOUS

## 2013-07-10 NOTE — Progress Notes (Signed)
ANTIBIOTIC CONSULT NOTE - INITIAL  Pharmacy Consult for Gentamicin Indication: Synergy for enterococcal endocarditis  Allergies  Allergen Reactions  . Shrimp [Shellfish Allergy] Hives, Nausea And Vomiting and Rash    Patient Measurements: Height: 5\' 10"  (177.8 cm) Weight: 185 lb 9.6 oz (84.188 kg) IBW/kg (Calculated) : 73  Vital Signs: Temp: 98 F (36.7 C) (02/04 1322) Temp src: Oral (02/04 1322) BP: 126/69 mmHg (02/04 1322) Pulse Rate: 84 (02/04 1322) Intake/Output from previous day: 02/03 0701 - 02/04 0700 In: 883 [P.O.:480; I.V.:3; IV Piggyback:400] Out: 3250 [Urine:3250] Intake/Output from this shift: Total I/O In: -  Out: 150 [Urine:150]  Labs:  Recent Labs  07/09/13 0505 07/10/13 0458  WBC 4.8  --   HGB 7.2* 8.2*  PLT 184  --   CREATININE 1.07 1.11   Estimated Creatinine Clearance: 64.9 ml/min (by C-G formula based on Cr of 1.11). No results found for this basename: VANCOTROUGH, Corlis Leak, VANCORANDOM, [place], GENTPEAK, GENTRANDOM, TOBRATROUGH, TOBRAPEAK, TOBRARND, AMIKACINPEAK, AMIKACINTROU, AMIKACIN,  in the last 72 hours   Microbiology: Recent Results (from the past 720 hour(s))  CULTURE, BLOOD (ROUTINE X 2)     Status: None   Collection Time    07/05/13 10:25 PM      Result Value Range Status   Specimen Description BLOOD LEFT ARM   Final   Special Requests BOTTLES DRAWN AEROBIC AND ANAEROBIC 5CC   Final   Culture  Setup Time     Final   Value: 07/06/2013 04:46     Performed at [facility]   Culture     Final   Value: ENTEROCOCCUS SPECIES     Note: COMBINATION THERAPY OF HIGH DOSE AMPICILLIN OR VANCOMYCIN, PLUS AN AMINOGLYCOSIDE, IS USUALLY INDICATED FOR SERIOUS ENTEROCOCCAL INFECTIONS.     Note: Gram Stain Report Called to,Read Back By and Verified With: Georgina Peer (flow mgr.) on 07/06/13 at 15:10 by Rise Mu     Performed at [facility]   Report Status 07/08/2013 FINAL   Final   Organism ID, Bacteria ENTEROCOCCUS  SPECIES   Final  CULTURE, BLOOD (ROUTINE X 2)     Status: None   Collection Time    07/05/13 10:33 PM      Result Value Range Status   Specimen Description BLOOD RIGHT ARM   Final   Special Requests BOTTLES DRAWN AEROBIC AND ANAEROBIC 10CC   Final   Culture  Setup Time     Final   Value: 07/06/2013 04:46     Performed at [facility]   Culture     Final   Value: ENTEROCOCCUS SPECIES     Note: SUSCEPTIBILITIES PERFORMED ON PREVIOUS CULTURE WITHIN THE LAST 5 DAYS.     Note: Gram Stain Report Called to,Read Back By and Verified With: Georgina Peer (flow mgr.) on 07/06/13 at 15:10 by Rise Mu     Performed at [facility]   Report Status 07/08/2013 FINAL   Final  URINE CULTURE     Status: None   Collection Time    07/05/13 11:17 PM      Result Value Range Status   Specimen Description URINE, CLEAN CATCH   Final   Special Requests NONE   Final   Culture  Setup Time     Final   Value: 07/06/2013 15:13     Performed at [facility]     Final   Value: 45,000 COLONIES/ML     Performed at [facility]   Culture     Final   Value: Multiple bacterial morphotypes present, none predominant. Suggest appropriate recollection if clinically indicated.     Performed at [facility]   Report Status 07/07/2013 FINAL   Final  CULTURE, BLOOD (ROUTINE X 2)     Status: None   Collection Time    07/08/13  1:20 PM      Result Value Range Status   Specimen Description BLOOD RIGHT ARM   Final   Special Requests BOTTLES DRAWN AEROBIC AND ANAEROBIC 10CC   Final   Culture  Setup Time     Final   Value: 07/08/2013 16:37     Performed at [facility]   Culture     Final   Value:        BLOOD CULTURE RECEIVED NO GROWTH TO DATE CULTURE WILL BE HELD FOR 5 DAYS BEFORE ISSUING A FINAL NEGATIVE REPORT     Performed at [facility]   Report Status PENDING   Incomplete  CULTURE, BLOOD (ROUTINE X 2)     Status: None   Collection Time     07/08/13  1:25 PM      Result Value Range Status   Specimen Description BLOOD RIGHT HAND   Final   Special Requests BOTTLES DRAWN AEROBIC AND ANAEROBIC 10CC   Final   Culture  Setup Time     Final   Value: 07/08/2013 16:37     Performed at [facility]   Culture     Final   Value:        BLOOD CULTURE RECEIVED NO GROWTH TO DATE CULTURE WILL BE HELD FOR 5 DAYS BEFORE ISSUING A FINAL NEGATIVE REPORT     Performed at [facility]   Report Status PENDING   Incomplete    Medical History: Past Medical History  Diagnosis Date  . Mitral regurgitation due to partially flail posterior mitral valve leaflet   . Rectal cancer   . Hyperlipidemia   . Colon cancer 07/18/12 bx    rectum=Invasive adenocarcinoma w/ extracellular mucin,polyp=benign  . Rheumatic fever as child    hx  . Hearing loss     partial greater on left  . Tinnitus     left ear  . Sinusitis     seasonal  . Arthritis     hands/knees  . Hematochezia     recent  . Heart murmur   . Hypothyroidism as child    hx of  . Numbness     TOES / FINGERS DUE TO CHEMO  . History of shingles   . Blood clot in vein     RT ARM WITH PICC LINE   . GERD (gastroesophageal reflux disease)     OCCASIONAL  . Nocturia   . Radiation     FOR COLON CANCER  . Umbilical hernia, incarcerated - s/p primary repair July 2014 01/03/2013  . Herpes zoster - R flank - with severe pain 03/11/2013   Assessment: 28 yoM with PMH of rectal cancer s/p resection and finished chemo in Nov, 2014. He was recently admitted for take down of ileostomy on 06/28/13 and returned to ED with fever and tachycardia on 1/30. Felt OK to discharge to home with tamiflu prescription for likely influenza and was not admitted. Pt called 1/31 to come back to ED since 4/4 bottles of blood cxt + GPCs.  Initially treated with vanc and zosyn, then narrowed to unasyn per ID recommendations following  culture sensitivities.  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  Pharmacy consulted to dose low-dose gentamicin for synergy.    IBW = 73kg, TBW <20% IBW   1/31 >> vanc >>2/2 1/31 >> zosyn >> 2/2 2/2 >> Unasyn >> 2/4 >> Gentamicin >>  Tmax: afeb WBCs: wnl Renal: SCr 1.07, CG 67  1/30 blood x2 >> enterococcus species (pansens) 1/30 Influenza PCR >> neg 1/30 urine >> 40K mult organisms 2/2 blood x 2 >> NGTD  Goal of Therapy:  Gentamicin peak 3-4, trough <1 Eradication of infection  Plan:  Gentamicin 80mg  IV q 8 hours (rounded to nearest 10mg  between actual and idea body weight).  F/u peak/trough at [facility].  Continue unasyn at current dose.    Ralene Bathe, PharmD, BCPS 07/10/2013, 1:48 PM  Pager: [phone]

## 2013-07-10 NOTE — Progress Notes (Signed)
Subjective: Pt feels okay.  No complaints other than dry mouth due to NPO.  Tolerating diet, no N/V.  [facility].  Pt continues to express concern regarding having PICC line at discharge for bacteremia due to h/o blood clots.  He's currently undecided about whether he wants to try without a PICC line at home with oral antibiotics if blood cultures come back positive or TEE is positive.   Objective: Vital signs in last 24 hours: Temp:  [97.9 F (36.6 C)-98.6 F (37 C)] 98 F (36.7 C) (02/04 0611) Pulse Rate:  [81-92] 81 (02/04 0611) Resp:  [16-18] 18 (02/04 0611) BP: (107-113)/(57-68) 113/68 mmHg (02/04 0611) SpO2:  [92 %-97 %] 92 % (02/04 0611) Last BM Date: 07/09/13  Intake/Output from previous day: 02/03 0701 - 02/04 0700 In: 883 [P.O.:480; I.V.:3; IV Piggyback:400] Out: 3250 [Urine:3250] Intake/Output this shift:    PE: Gen:  Alert, NAD, pleasant Card:  RRR, 3/6 SEM heard throughout Pulm:  CTA, no W/R/R Abd: Soft, NT/ND, +BS, no HSM, incision site with thin eschar well healed in left quadrant   Lab Results:   Recent Labs  07/09/13 0505 07/10/13 0458  WBC 4.8  --   HGB 7.2* 8.2*  HCT 21.8* 24.7*  PLT 184  --    BMET  Recent Labs  07/09/13 0505 07/10/13 0458  K 3.6* 4.1  CREATININE 1.07 1.11   PT/INR No results found for this basename: LABPROT, INR,  in the last 72 hours CMP     Component Value Date/Time   NA 134* 07/07/2013 0526   NA 142 05/21/2013 1139   K 4.1 07/10/2013 0458   K 3.9 05/21/2013 1139   CL 101 07/07/2013 0526   CO2 23 07/07/2013 0526   CO2 28 05/21/2013 1139   GLUCOSE 136* 07/07/2013 0526   GLUCOSE 105 05/21/2013 1139   BUN 15 07/07/2013 0526   BUN 11.1 05/21/2013 1139   CREATININE 1.11 07/10/2013 0458   CREATININE 0.9 05/21/2013 1139   CREATININE 1.13 01/14/2013 1155   CALCIUM 7.9* 07/07/2013 0526   CALCIUM 9.8 05/21/2013 1139   PROT 6.6 07/05/2013 2225   PROT 6.7 05/21/2013 1139   ALBUMIN 3.4* 07/05/2013 2225   ALBUMIN 3.6  05/21/2013 1139   AST 25 07/05/2013 2225   AST 51* 05/21/2013 1139   ALT 31 07/05/2013 2225   ALT 47 05/21/2013 1139   ALKPHOS 97 07/05/2013 2225   ALKPHOS 126 05/21/2013 1139   BILITOT 0.4 07/05/2013 2225   BILITOT 0.75 05/21/2013 1139   GFRNONAA 66* 07/10/2013 0458   GFRAA 76* 07/10/2013 0458   Lipase  No results found for this basename: lipase       Studies/Results: No results found.  Anti-infectives: Anti-infectives   Start     Dose/Rate Route Frequency Ordered Stop   07/08/13 1400  Ampicillin-Sulbactam (UNASYN) 3 g in sodium chloride 0.9 % 100 mL IVPB     3 g 100 mL/hr over 60 Minutes Intravenous Every 6 hours 07/08/13 1250     07/07/13 0800  vancomycin (VANCOCIN) IVPB 1000 mg/200 mL premix  Status:  Discontinued     1,000 mg 200 mL/hr over 60 Minutes Intravenous Every 12 hours 07/06/13 1910 07/08/13 1250   07/06/13 1930  piperacillin-tazobactam (ZOSYN) IVPB 3.375 g  Status:  Discontinued     3.375 g 12.5 mL/hr over 240 Minutes Intravenous 3 times per day 07/06/13 1910 07/08/13 1250   07/06/13 1930  vancomycin (VANCOCIN) 1,500 mg in sodium chloride 0.9 %  500 mL IVPB     1,500 mg 250 mL/hr over 120 Minutes Intravenous  Once 07/06/13 1910 07/06/13 2248       Assessment/Plan Hx resection of rectal cancer with coloanal anastomosis and diverting loop ileostomy  S/P ileostomy takedown Dr. Johney Maine 06/28/13  Enterococcal bacteremia  -Changed to Unasyn 2/2 per ID recs for a minimum of 2 weeks. He is against PICC line placement due to a DVT to extremity with last PICC line placement. We will await TEE and final blood cultures to seek alternatives to PICC line with IDs assistance. If no other options are available, will discuss with hematology regarding anticoagulation therapy while he has the PICC line.  -Blood cultures repeated 2/2--negative to date  -TEE today at [facility] -Heart healthy diet but, NPO currently for TEE  -Mobilize  -Pain control  Post herpetic neuralgia from  Shingles -pain control  Anemia  -holding heparin due to anemia, restart tomorrow or later today since Hgb stabilizing? -type and screen done -repeat H&H shows improvement to Hgb 8.2 from 7.2     LOS: 4 days    DORT, Yunuen Mordan 07/10/2013, 7:22 AM Pager: [phone]

## 2013-07-10 NOTE — Interval H&P Note (Signed)
History and Physical Interval Note:  07/10/2013 10:49 AM  Tyler Diaz  has presented today for surgery, with the diagnosis of bacteremia  The various methods of treatment have been discussed with the patient and family. After consideration of risks, benefits and other options for treatment, the patient has consented to  Procedure(s): TRANSESOPHAGEAL ECHOCARDIOGRAM (TEE) (N/A) as a surgical intervention .  The patient's history has been reviewed, patient examined, no change in status, stable for surgery.  I have reviewed the patient's chart and labs.  Questions were answered to the patient's satisfaction.     SKAINS, MARK

## 2013-07-10 NOTE — Consult Note (Addendum)
[facility]  Total days of antibiotics 5        Day 3 amp/sub               Reason for Consult: enterococcal native valve endocarditis   Referring Physician: gross  Active Problems:   Bacteremia    HPI: Tyler Diaz is a 68 y.o. male with history of stage 3 well-differentiated adenocarcinoma of rectum-[id number] s/p resection with very low coloanal anastomosis and diverting loop ileostomy in July 2014 s/p neoadjuvant xeloda and radiation c/b DVT. Recently admitted on 1/23-26 for ileostomy takedown. Tyler Diaz reported having acute onset of fevers, myalgia, headache on 1/30 for which Tyler Diaz went to the ED to be evaluated. Tyler Diaz was found to have SIRS with fever, tachycardia, and leukocytosis of 18K. Infectious work up revealed blood cx x 2 + GPC prs within 24hrs. They speciated to be amp S enterococcus species. Since Tyler Diaz had known history of mitral regurgitation due to posterior flail leaflet, Tyler Diaz underwent TEE on 2/4 for rule out of endocarditis. Based upon cardiologists evaluation, there was shaggy appearance on posterior leaflet that was unable to exclude small vegetations, thus recommended to treat for presumed NVE.  Past Medical History  Diagnosis Date  . Mitral regurgitation due to partially flail posterior mitral valve leaflet   . Rectal cancer   . Hyperlipidemia   . Colon cancer 07/18/12 bx    rectum=Invasive adenocarcinoma w/ extracellular mucin,polyp=benign  . Rheumatic fever as child    hx  . Hearing loss     partial greater on left  . Tinnitus     left ear  . Sinusitis     seasonal  . Arthritis     hands/knees  . Hematochezia     recent  . Heart murmur   . Hypothyroidism as child    hx of  . Numbness     TOES / FINGERS DUE TO CHEMO  . History of shingles   . Blood clot in vein     RT ARM WITH PICC LINE   . GERD (gastroesophageal reflux disease)     OCCASIONAL  . Nocturia   . Radiation     FOR COLON CANCER  . Umbilical hernia, incarcerated - s/p  primary repair July 2014 01/03/2013  . Herpes zoster - R flank - with severe pain 03/11/2013    Allergies:  Allergies  Allergen Reactions  . Shrimp [Shellfish Allergy] Hives, Nausea And Vomiting and Rash    . ampicillin (OMNIPEN) IV  2 g Intravenous Q4H  . ferrous sulfate  325 mg Oral TID WC  . folic acid  1 mg Oral Daily  . gentamicin  80 mg Intravenous Q8H  . lip balm  1 application Topical BID  . lisinopril  10 mg Oral q morning - 10a  . sodium chloride  3 mL Intravenous Q12H  . sodium chloride  3 mL Intravenous Q12H  . vitamin B-12  1,000 mcg Oral Daily  . vitamin C  500 mg Oral BID   History  Substance Use Topics  . Smoking status: Former Smoker -- 1.50 packs/day for 20 years    Types: Cigarettes    Quit date: 07/27/1972  . Smokeless tobacco: Never Used  . Alcohol Use: No    Family History  Problem Relation Age of Onset  . Cancer Mother     abdominal ca ?ovarian?  . Cancer Sister     liver cancer    Review of Systems  Constitutional: Negative for fever, chills, diaphoresis, activity change, appetite change, fatigue and unexpected weight change.  HENT: Negative for congestion, sore throat, rhinorrhea, sneezing, trouble swallowing and sinus pressure.  Eyes: Negative for photophobia and visual disturbance.  Respiratory: Negative for cough, chest tightness, shortness of breath, wheezing and stridor.  Cardiovascular: Negative for chest pain, palpitations and leg swelling.  Gastrointestinal: positive for diarrhea. Negative for nausea, vomiting, abdominal pain, constipation, blood in stool, abdominal distention and anal bleeding.  Genitourinary: Negative for dysuria, hematuria, flank pain and difficulty urinating.  Musculoskeletal: Negative for myalgias, back pain, joint swelling, arthralgias and gait problem.  Skin: Negative for color change, pallor, rash and wound.  Neurological: Negative for dizziness, tremors, weakness and light-headedness.  Hematological: Negative  for adenopathy. Does not bruise/bleed easily.  Psychiatric/Behavioral: Negative for behavioral problems, confusion, sleep disturbance, dysphoric mood, decreased concentration and agitation.     OBJECTIVE: Temp:  [98 F (36.7 C)-98.6 F (37 C)] 98.6 F (37 C) (02/04 1136) Pulse Rate:  [80-98] 91 (02/04 1200) Resp:  [7-18] 17 (02/04 1200) BP: (101-143)/(44-86) 101/46 mmHg (02/04 1200) SpO2:  [92 %-100 %] 100 % (02/04 1200) Physical Exam  Constitutional: Tyler Diaz is oriented to person, place, and time. Tyler Diaz appears well-developed and well-nourished. No distress.  HENT:  Mouth/Throat: Oropharynx is clear and moist. No oropharyngeal exudate.  Cardiovascular: Normal rate, regular rhythm and 3/6 SEM BH at apex radiating to axilla. Exam reveals no gallop and no friction rub.  No murmur heard.  Pulmonary/Chest: Effort normal and breath sounds normal. No respiratory distress. Tyler Diaz has no wheezes.  Abdominal: Soft. Bowel sounds are normal. Tyler Diaz exhibits no distension. There is no tenderness.  Lymphadenopathy:  Tyler Diaz has no cervical adenopathy.  Neurological: Tyler Diaz is alert and oriented to person, place, and time.  Skin: Skin is warm and dry. No rash noted. No erythema.  Psychiatric: Tyler Diaz has a normal mood and affect. His behavior is normal.    LABS: Results for orders placed during the hospital encounter of 07/06/13 (from the past 48 hour(s))  CULTURE, BLOOD (ROUTINE X 2)     Status: None   Collection Time    07/08/13  1:20 PM      Result Value Range   Specimen Description BLOOD RIGHT ARM     Special Requests BOTTLES DRAWN AEROBIC AND ANAEROBIC 10CC     Culture  Setup Time       Value: 07/08/2013 16:37     Performed at [facility]   Culture       Value:        BLOOD CULTURE RECEIVED NO GROWTH TO DATE CULTURE WILL BE HELD FOR 5 DAYS BEFORE ISSUING A FINAL NEGATIVE REPORT     Performed at [facility]   Report Status PENDING    CULTURE, BLOOD (ROUTINE X 2)     Status: None   Collection  Time    07/08/13  1:25 PM      Result Value Range   Specimen Description BLOOD RIGHT HAND     Special Requests BOTTLES DRAWN AEROBIC AND ANAEROBIC 10CC     Culture  Setup Time       Value: 07/08/2013 16:37     Performed at [facility]   Culture       Value:        BLOOD CULTURE RECEIVED NO GROWTH TO DATE CULTURE WILL BE HELD FOR 5 DAYS BEFORE ISSUING A FINAL NEGATIVE REPORT     Performed at [facility]  Report Status PENDING    CBC     Status: Abnormal   Collection Time    07/09/13  5:05 AM      Result Value Range   WBC 4.8  4.0 - 10.5 K/uL   RBC 2.06 (*) 4.22 - 5.81 MIL/uL   Hemoglobin 7.2 (*) 13.0 - 17.0 g/dL   HCT 21.8 (*) 39.0 - 52.0 %   MCV 105.8 (*) 78.0 - 100.0 fL   MCH 35.0 (*) 26.0 - 34.0 pg   MCHC 33.0  30.0 - 36.0 g/dL   RDW 14.4  11.5 - 15.5 %   Platelets 184  150 - 400 K/uL  POTASSIUM     Status: Abnormal   Collection Time    07/09/13  5:05 AM      Result Value Range   Potassium 3.6 (*) 3.7 - 5.3 mEq/L  CREATININE, SERUM     Status: Abnormal   Collection Time    07/09/13  5:05 AM      Result Value Range   Creatinine, Ser 1.07  0.50 - 1.35 mg/dL   GFR calc non Af Amer 69 (*) >90 mL/min   GFR calc Af Amer 80 (*) >90 mL/min   Comment: (NOTE)     The eGFR has been calculated using the CKD EPI equation.     This calculation has not been validated in all clinical situations.     eGFR's persistently <90 mL/min signify possible Chronic Kidney     Disease.  TYPE AND SCREEN     Status: None   Collection Time    07/09/13  9:29 AM      Result Value Range   ABO/RH(D) A POS     Antibody Screen NEG     Sample Expiration 07/12/2013    POTASSIUM     Status: None   Collection Time    07/10/13  4:58 AM      Result Value Range   Potassium 4.1  3.7 - 5.3 mEq/L  CREATININE, SERUM     Status: Abnormal   Collection Time    07/10/13  4:58 AM      Result Value Range   Creatinine, Ser 1.11  0.50 - 1.35 mg/dL   GFR calc non Af Amer 66 (*) >90 mL/min    GFR calc Af Amer 76 (*) >90 mL/min   Comment: (NOTE)     The eGFR has been calculated using the CKD EPI equation.     This calculation has not been validated in all clinical situations.     eGFR's persistently <90 mL/min signify possible Chronic Kidney     Disease.  HEMOGLOBIN AND HEMATOCRIT, BLOOD     Status: Abnormal   Collection Time    07/10/13  4:58 AM      Result Value Range   Hemoglobin 8.2 (*) 13.0 - 17.0 g/dL   HCT 24.7 (*) 39.0 - 52.0 %    MICRO: 2/2 blood cx ngtd 1/30 blood cx amp S enterococcus (gent S) 1/30 urine cx: mixed flora  TEE: 07/10/13 Partial flail P2, posterior mitral valve leaflet (present since 2003) with severe anterior directed mitral regurgitation.  Can not exclude small vegetation on the posterior mitral leaflet due to shaggy appearance of flail leaflet. Would suggest treatment for endocarditis.   Assessment/Plan:  68yo M with hx of rectal cancer s/p resection and chemo, recently underwent ileostomy take-down and readmitted on 1/30 for enterococcal bacteremia, TEE concerning for mitral valve endocarditis  - will change antibiotics  to ampicillin 2gm IV Q 4hr and discontinue amp/sub - will start low dose gentamicin. Pharmacy dosing and checking peak and trough after 3rd dose/before 4th. - can have picc line placed ( in Left arm) on Thursday to treat for a period of 4 wks with amp plus gent,  -  use 07/08/13 as day 1 of 28 day course of antibiotics - for home health, Tyler Diaz will need twice a week BMP to ensure that his kidney function remains intact while taking gentamicin - home health can arrange for continuous infusion of ampicillin - Tyler Diaz will need gent trough twice a week while on it - if patient has AKI from gent,  would switch him to ceftriaxone 2gm VI Q12 plus ampicillin.  -spent 60 min with greater than 50% spent on coordinating care and counseling treatment choices for this infection  Brunetta Newingham [facility]  D[facility] [phone] pager [phone] cell

## 2013-07-10 NOTE — Progress Notes (Signed)
Telephone order from [facility] that patient may resume his heart healthy diet, also informed her that TEE results were available Neta Mends RN 07-10-2012 15:13pm

## 2013-07-10 NOTE — Progress Notes (Signed)
  Echocardiogram Echocardiogram Transesophageal has been performed.  Mauricio Po 07/10/2013, 11:33 AM

## 2013-07-10 NOTE — CV Procedure (Signed)
Indications: Enterococcal bacteremia  Findings:  Partial flail P2, posterior mitral valve leaflet (present since 2003) with severe anterior directed mitral regurgitation.   Can not exclude small vegetation on the posterior mitral leaflet due to shaggy appearance of flail leaflet. Would suggest treatment for endocarditis.   No evidence of ring abscess.   Normal EF.   Spoke with Dr. Wynonia Lawman, his cardiologist, who has been following his mitral valve for greater than 10 years and partially flail posterior leaflet has been present through that time period. He has not desired surgery.

## 2013-07-10 NOTE — Progress Notes (Signed)
Patient vital signs stable.  Patient transferred to [place] for TEE and PA notified that results of TEE are available.  Patient tolerated his diet.  Wife at bedside and supportive. Will continue to monitor. Basilio Cairo, Student RN

## 2013-07-10 NOTE — Progress Notes (Signed)
Echocardiogram suspicious for endocarditis.  Discuss with Dr. Baxter Flattery with infectious disease.  She feels the patient requires IV antibiotics.  Discussed with the patient.  He is willing to have another PICC line placed in the left, other arm.  I will try and touch base with his hematologist/oncologist to see if any more aggressive anticoagulation needs to be done this time.  Hopefully he can go home tomorrow once the PICC line is done and antibiotics are arranged.

## 2013-07-11 ENCOUNTER — Encounter (HOSPITAL_COMMUNITY): Payer: Self-pay | Admitting: Cardiology

## 2013-07-11 DIAGNOSIS — D638 Anemia in other chronic diseases classified elsewhere: Secondary | ICD-10-CM

## 2013-07-11 DIAGNOSIS — Z86718 Personal history of other venous thrombosis and embolism: Secondary | ICD-10-CM

## 2013-07-11 DIAGNOSIS — R109 Unspecified abdominal pain: Secondary | ICD-10-CM

## 2013-07-11 DIAGNOSIS — I330 Acute and subacute infective endocarditis: Secondary | ICD-10-CM

## 2013-07-11 LAB — GENTAMICIN LEVEL, TROUGH: Gentamicin Trough: 1.5 ug/mL (ref 0.5–2.0)

## 2013-07-11 LAB — CREATININE, SERUM
CREATININE: 1.01 mg/dL (ref 0.50–1.35)
GFR calc non Af Amer: 74 mL/min — ABNORMAL LOW (ref >90)
GFR, EST AFRICAN AMERICAN: 86 mL/min — AB (ref >90)

## 2013-07-11 LAB — HEMOGLOBIN AND HEMATOCRIT, BLOOD
HEMATOCRIT: 22.8 % — AB (ref 39.0–52.0)
Hemoglobin: 7.6 g/dL — ABNORMAL LOW (ref 13.0–17.0)

## 2013-07-11 LAB — GENTAMICIN LEVEL, PEAK: Gentamicin Pk: 4.6 ug/mL — ABNORMAL LOW (ref 5.0–10.0)

## 2013-07-11 LAB — POTASSIUM: Potassium: 3.9 mEq/L (ref 3.7–5.3)

## 2013-07-11 MED ORDER — SODIUM CHLORIDE 0.9 % IV SOLN: 2.0000 g | INTRAVENOUS | Status: DC

## 2013-07-11 MED ORDER — ENOXAPARIN SODIUM 40 MG/0.4ML ~~LOC~~ SOLN
40.0000 mg | SUBCUTANEOUS | Status: DC
  Filled 2013-07-11: qty 0.4

## 2013-07-11 MED ORDER — SODIUM CHLORIDE 0.9 % IJ SOLN
10.0000 mL | INTRAMUSCULAR | Status: DC | PRN
  Administered 2013-07-11 (×2): 10 mL

## 2013-07-11 MED ORDER — HEPARIN SOD (PORK) LOCK FLUSH 100 UNIT/ML IV SOLN
250.0000 [IU] | INTRAVENOUS | Status: AC | PRN
  Administered 2013-07-11: 250 [IU]

## 2013-07-11 MED ORDER — ENOXAPARIN SODIUM 40 MG/0.4ML ~~LOC~~ SOLN: 40.0000 mg | SUBCUTANEOUS | Status: DC

## 2013-07-11 MED ORDER — GENTAMICIN IN SALINE 1.6-0.9 MG/ML-% IV SOLN: 80.0000 mg | Freq: Three times a day (TID) | INTRAVENOUS | Status: DC

## 2013-07-11 MED ORDER — ENOXAPARIN (LOVENOX) PATIENT EDUCATION KIT
PACK | Freq: Once | Status: DC
  Filled 2013-07-11: qty 1

## 2013-07-11 NOTE — Progress Notes (Signed)
Peripherally Inserted Central Catheter/Midline Placement  The IV Nurse has discussed with the patient and/or persons authorized to consent for the patient, the purpose of this procedure and the potential benefits and risks involved with this procedure.  The benefits include less needle sticks, lab draws from the catheter and patient may be discharged home with the catheter.  Risks include, but not limited to, infection, bleeding, blood clot (thrombus formation), and puncture of an artery; nerve damage and irregular heat beat.  Alternatives to this procedure were also discussed.  PICC/Midline Placement Documentation        Tyler Diaz 07/11/2013, 4:51 PM

## 2013-07-11 NOTE — Progress Notes (Signed)
IP PROGRESS NOTE  Subjective:   [name] is well-known to me with a history of rectal cancer. He underwent an ileostomy takedown procedure on 06/28/2013. He was readmitted on 07/06/2013 with enterococcus bacteremia. Infectious disease has recommended placement of a PICC for home IV antibiotics. [name] has a history of a right upper extremity DVT associated with a PICC in August of 2014. The PICC was removed 05/21/2013.  [name] reports soreness in the right abdomen. No other complaint.  Objective: Vital signs in last 24 hours: Blood pressure 116/71, pulse 83, temperature 98.1 F (36.7 C), temperature source Oral, resp. rate 16, height 5\' 10"  (1.778 m), weight 185 lb 9.6 oz (84.188 kg), SpO2 96.00%.  Intake/Output from previous day: 02/04 0701 - 02/05 0700 In: 950 [P.O.:600; IV Piggyback:350] Out: 2525 [Urine:2525]  Physical Exam:  HEENT: No thrush Lungs: Clear bilaterally Cardiac: Regular rate and rhythm, 2/6 systolic murmur heard throughout the precordium Abdomen: Mild induration surrounding the right lower quadrant scar, no hepatosplenomegaly Extremities: No leg edema. No right arm edema the    Lab Results:  Recent Labs  07/09/13 0505 07/10/13 0458 07/11/13 0435  WBC 4.8  --   --   HGB 7.2* 8.2* 7.6*  HCT 21.8* 24.7* 22.8*  PLT 184  --   --     BMET  Recent Labs  07/10/13 0458 07/11/13 0435  K 4.1 3.9  CREATININE 1.11 1.01    Studies/Results: No results found.  Medications: I have reviewed the patient's current medications.  Assessment/Plan:  1. Stage III rectal cancer, status post neoadjuvant Xeloda and radiation followed by a low anterior resection and coloanal anastomosis 12/27/2012, final cycle of adjuvant CAPOX beginning 04/24/2013  2. Enterococcus bacteremia, probable endocarditis-07/05/2013  3. Mitral regurgitation  4. History of rheumatic fever  5. Right arm DVT, take associated, 01/18/2013  6. Zoster-right thoracic dermatome September  2014  7. Postherpetic neuralgia  8. Anemia secondary to surgery, chronic disease, and bacteremia  [name] has been diagnosed with enterococcus bacteremia/endocarditis. He will need home IV antibiotics. He will need a PICC. I recommend Lovenox anticoagulation while the PICC is in place.  Recommendations:  1. Lovenox 40 mg daily while the PICC is in place 2. Followup hemoglobin as an outpatient 3. Followup as scheduled at the [facility] 07/22/2013   LOS: 5 days   [place]  07/11/2013, 11:56 AM

## 2013-07-11 NOTE — Progress Notes (Signed)
1 Day Post-Op  Subjective: Pt feels good.  No N/V/abdominal pain.  No CP/SOB.  Ambulating well.  Pending eval by Dr. Benay Spice and picc line for IV antibiotics.    Objective: Vital signs in last 24 hours: Temp:  [98 F (36.7 C)-98.6 F (37 C)] 98.1 F (36.7 C) (02/05 0600) Pulse Rate:  [80-98] 83 (02/05 0600) Resp:  [7-18] 16 (02/05 0600) BP: (101-143)/(44-86) 116/71 mmHg (02/05 0600) SpO2:  [90 %-100 %] 96 % (02/05 0600) Last BM Date: 07/10/13  Intake/Output from previous day: 02/04 0701 - 02/05 0700 In: 950 [P.O.:600; IV Piggyback:350] Out: 2525 [Urine:2525] Intake/Output this shift:    PE: Gen:  Alert, NAD, pleasant Card:  RRR, SEM murmur heard, no G/R Pulm:  CTA, no W/R/R Abd: Soft, NT/ND, +BS, no HSM, well healing abdominal scars noted Ext:  No erythema, edema, or tenderness  Lab Results:   Recent Labs  07/09/13 0505 07/10/13 0458 07/11/13 0435  WBC 4.8  --   --   HGB 7.2* 8.2* 7.6*  HCT 21.8* 24.7* 22.8*  PLT 184  --   --    BMET  Recent Labs  07/10/13 0458 07/11/13 0435  K 4.1 3.9  CREATININE 1.11 1.01   PT/INR No results found for this basename: LABPROT, INR,  in the last 72 hours CMP     Component Value Date/Time   NA 134* 07/07/2013 0526   NA 142 05/21/2013 1139   K 3.9 07/11/2013 0435   K 3.9 05/21/2013 1139   CL 101 07/07/2013 0526   CO2 23 07/07/2013 0526   CO2 28 05/21/2013 1139   GLUCOSE 136* 07/07/2013 0526   GLUCOSE 105 05/21/2013 1139   BUN 15 07/07/2013 0526   BUN 11.1 05/21/2013 1139   CREATININE 1.01 07/11/2013 0435   CREATININE 0.9 05/21/2013 1139   CREATININE 1.13 01/14/2013 1155   CALCIUM 7.9* 07/07/2013 0526   CALCIUM 9.8 05/21/2013 1139   PROT 6.6 07/05/2013 2225   PROT 6.7 05/21/2013 1139   ALBUMIN 3.4* 07/05/2013 2225   ALBUMIN 3.6 05/21/2013 1139   AST 25 07/05/2013 2225   AST 51* 05/21/2013 1139   ALT 31 07/05/2013 2225   ALT 47 05/21/2013 1139   ALKPHOS 97 07/05/2013 2225   ALKPHOS 126 05/21/2013 1139   BILITOT 0.4 07/05/2013  2225   BILITOT 0.75 05/21/2013 1139   GFRNONAA 74* 07/11/2013 0435   GFRAA 86* 07/11/2013 0435   Lipase  No results found for this basename: lipase       Studies/Results: No results found.  Anti-infectives: Anti-infectives   Start     Dose/Rate Route Frequency Ordered Stop   07/10/13 1430  gentamicin (GARAMYCIN) 80 mg in dextrose 5 % 50 mL IVPB  Status:  Discontinued     1 mg/kg  84.2 kg 104 mL/hr over 30 Minutes Intravenous Every 8 hours 07/10/13 1348 07/10/13 1354   07/10/13 1430  gentamicin (GARAMYCIN) IVPB 80 mg     80 mg 100 mL/hr over 30 Minutes Intravenous Every 8 hours 07/10/13 1354     07/10/13 1300  ampicillin (OMNIPEN) 2 g in sodium chloride 0.9 % 50 mL IVPB     2 g 150 mL/hr over 20 Minutes Intravenous 6 times per day 07/10/13 1211     07/08/13 1400  [MAR Hold]  Ampicillin-Sulbactam (UNASYN) 3 g in sodium chloride 0.9 % 100 mL IVPB  Status:  Discontinued     (On MAR Hold since 07/10/13 0930)   3 g 100 mL/hr over  60 Minutes Intravenous Every 6 hours 07/08/13 1250 07/10/13 1211   07/07/13 0800  vancomycin (VANCOCIN) IVPB 1000 mg/200 mL premix  Status:  Discontinued     1,000 mg 200 mL/hr over 60 Minutes Intravenous Every 12 hours 07/06/13 1910 07/08/13 1250   07/06/13 1930  piperacillin-tazobactam (ZOSYN) IVPB 3.375 g  Status:  Discontinued     3.375 g 12.5 mL/hr over 240 Minutes Intravenous 3 times per day 07/06/13 1910 07/08/13 1250   07/06/13 1930  vancomycin (VANCOCIN) 1,500 mg in sodium chloride 0.9 % 500 mL IVPB     1,500 mg 250 mL/hr over 120 Minutes Intravenous  Once 07/06/13 1910 07/06/13 2248       Assessment/Plan Hx resection of rectal cancer with coloanal anastomosis and diverting loop ileostomy  S/P ileostomy takedown Dr. Johney Maine 06/28/13  Enterococcal bacteremia  Bacterial endocarditis -Changed from Zosyn (3 days) Unasyn (3 days) Vanc (3 days) to Ampicillin and Gentamicin per ID's recommendation (4weeks with PICC) for the treatment of bacterial  endocarditis due to questionable vegetation on TEE -Insert PICC line -Talked to Dr. Benay Spice who will recommend we start DVT prophylaxis at discharge such as lovenox, coumadin, or xeralto, he will officially consult later today -Blood cultures repeated 2/2-- still negative to date  -Heart healthy diet but -Mobilize  -Pain control  Post herpetic neuralgia from Shingles  -pain control  Anemia  -holding heparin due to anemia -Hgt dropped to 7.6 from 8.2 yesterday -Unsure of reason for drop in Hgb after yesterdays improvement, no signs of active bleeding, asymptomatic, and not likely dilutional  He is agreeable to PICC line.  Ordered HH nursing for IV antibiotics through PICC, will need gent trough twice a week and BMET's twice a week to check kidney function per Dr. Baxter Flattery from [place].  Hopefully home today with [facility] after Dr. Benay Spice sees the patient and gives recommendations about anticoagulation given h/o DVT with last PICC line.     LOS: 5 days    Coralie Keens 07/11/2013, 8:42 AM Pager: [phone]

## 2013-07-11 NOTE — Progress Notes (Signed)
Assessment unchanged. Pt and sons verbalized understanding of dc instructions through teach back. Understands My Chart yet doesn't have a computer. Script for Lovenox given as provided by MD as well as instruction and Neurosurgeon. Son plans to administer injectable at home-"I've given (pt) those shots before" stated son, Corene Cornea. Pam with [facility] in to speak with pt and family briefly prior to dc. [place] nurse to meet pt at home around 8 pm this evening to continue antibiotic regimen as ordered. Discharged via wc to front entrance to meet awaiting vehicle to carry home. Accompanied by NT and sons x 2.

## 2013-07-11 NOTE — Care Management Note (Signed)
[facility] infusion rep Carolynn Sayers notified of pt discharge. IV ABX rx faxed to [facility] at [phone]. Confirmation received. Start of care scheduled for tonight 07/11/13 at 8pm. No other needs identified.      Venita Lick Jaydan Meidinger,MSN,RN [phone]

## 2013-07-11 NOTE — Progress Notes (Signed)
    [facility] for Infectious Disease    Date of Admission:  07/06/2013   Total days of antibiotics 6        Day 2 amp        Day 2 gent           ID: Tyler Diaz is a 68 y.o. male with hx of rectal ca s/p resection, ileostomy s/p ileostomy takedown who presented with SIRS found to have enterococcal bacteremia and TEE possible showing endocarditis Active Problems:   Rectal cancer - distal s/p lap LAR/coloanal July 2014 - [id number].   Hypertension   Mitral regurgitation due to partially flail posterior mitral valve leaflet   Ileostomy - diverting loop - s/p takedown 06/28/2013   Stricture of coloanal anastomosis   Bacteremia    Subjective: Afebrile.  Medications:  . ampicillin (OMNIPEN) IV  2 g Intravenous Q4H  . ferrous sulfate  325 mg Oral TID WC  . folic acid  1 mg Oral Daily  . gentamicin  80 mg Intravenous Q8H  . lip balm  1 application Topical BID  . lisinopril  10 mg Oral q morning - 10a  . sodium chloride  3 mL Intravenous Q12H  . sodium chloride  3 mL Intravenous Q12H  . vitamin B-12  1,000 mcg Oral Daily  . vitamin C  500 mg Oral BID    Objective: Vital signs in last 24 hours: Temp:  [98.1 F (36.7 C)-98.2 F (36.8 C)] 98.1 F (36.7 C) (02/05 0600) Pulse Rate:  [83-92] 83 (02/05 0600) Resp:  [16] 16 (02/05 0600) BP: (111-116)/(69-71) 116/71 mmHg (02/05 0600) SpO2:  [96 %-98 %] 96 % (02/05 0600)   Lab Results  Recent Labs  07/09/13 0505 07/10/13 0458 07/11/13 0435  WBC 4.8  --   --   HGB 7.2* 8.2* 7.6*  HCT 21.8* 24.7* 22.8*  K 3.6* 4.1 3.9  CREATININE 1.07 1.11 1.01    Microbiology: 2/2 blood cx ngtd  1/30 blood cx amp S enterococcus (gent S)  1/30 urine cx: mixed flora    Assessment/Plan: 68yo M with hx of rectal cancer s/p resection and chemo, recently underwent ileostomy take-down and readmitted on 1/30 for enterococcal bacteremia, TEE concerning for mitral valve endocarditis   - continue with ampicillin 2gm IV Q 4hr and  low  dose gentamicin. Pharmacy dosing and checking peak and trough after 3rd dose/before 4th. Gent trough due at 2:00 today - can have picc line placed ( in Left arm) today fpr IV antibiotics, ampicillin and gent, for a period of 4 wks  - use 07/08/13 as day 1 of 28 day course of antibiotics  - will get cbc with diff and bmp tomorrow  for home health,  - he will need twice a week BMP to ensure that his kidney function remains intact while taking gentamicin  - home health can arrange for continuous infusion of ampicillin  - he will need gent trough twice a week while on it  - if patient has AKI from gent, would switch him to ceftriaxone 2gm VI Q12 plus ampicillin. - he will need weekly picc line dressing changes  - per Dr. Gearldine Shown recommendation, patient to receive lovenox while on antibioticx x 25 days to prevent recurrence of DVT   Tyler Diaz, [facility] for Infectious Diseases Cell: [phone] Pager: [phone]  07/11/2013, 2:01 PM

## 2013-07-11 NOTE — Progress Notes (Signed)
Awaiting input about anticoagulation with PICC.  Probable Lovenox.

## 2013-07-12 ENCOUNTER — Telehealth: Payer: Self-pay | Admitting: *Deleted

## 2013-07-12 MED ORDER — ENOXAPARIN SODIUM 120 MG/0.8ML ~~LOC~~ SOLN: 60.0000 mg | SUBCUTANEOUS | Status: DC

## 2013-07-12 NOTE — Telephone Encounter (Signed)
Asking what to do about his Lovenox dosing? Has the 120 mg syringe in home from prior treatment (#12 syringes). Can't afford to purchase medication. Per Dr. Benay Spice, [place] to change dose to 60 mg daily and can waste 1/2 of the 120 mg syringe. Come 120mg /0.9ml--waste to 0.4 ml. Spoke with his son, Ulice Dash and gave him directions also. When he is here 2/16 for his follow up appointment, can check pharmacy for samples to get him through until his PICC line is removed.

## 2013-07-12 NOTE — Progress Notes (Signed)
PHARMACY NOTE:  GENTAMICIN  Yesterday's gentamicin peak/trough were 4.6 and 1.5 on 80mg  IV q8h.  Patient was discharged yesterday before these were addressed.  Based upon these results, recommend changing gentamicin to 80mg  IV q12h.  Notified [place].  Clayburn Pert, PharmD, BCPS Pager: [phone] 07/12/2013  8:52 AM

## 2013-07-14 LAB — CULTURE, BLOOD (ROUTINE X 2)
Culture: NO GROWTH
Culture: NO GROWTH

## 2013-07-16 ENCOUNTER — Telehealth (INDEPENDENT_AMBULATORY_CARE_PROVIDER_SITE_OTHER): Payer: Self-pay

## 2013-07-16 ENCOUNTER — Telehealth: Payer: Self-pay | Admitting: *Deleted

## 2013-07-16 NOTE — Telephone Encounter (Signed)
Pt is scheduled for hospital f/u on Thursday 2/12 9:00.  He gets weekly gentamycin labs/dose at that time - a 9:00 trough, 10:00 med dose, and 11:00 peak lab drawn each Thursday.  Patient is on a 24 hr/day drip of ampicillin that has a scheduled KVO time for the gentamycin administration/labs. Next available opening on Dr. Storm Frisk schedule is 2/23.  Please advise if he can be rescheduled to the 23rd, or where else he should be booked. Landis Gandy, RN

## 2013-07-16 NOTE — Telephone Encounter (Signed)
Called pt to check on him after surgery. The pt is doing ok. The pt states that he feels better now at home then he did in the hospital. The pt has home health coming out to the home to do the IV abx's with the Picc line and his son is helping him with the Lovenox injections. The pt has a f/u appt with Dr Johney Maine on 07/23/13. The pt told me he has an appt with Dr Benay Spice on 07/22/13. I advised pt to call us if any questions or concerns come up. The pt understands.

## 2013-07-16 NOTE — Telephone Encounter (Signed)
Patient has gent labs drawn Mondays and Thursdays.

## 2013-07-17 ENCOUNTER — Encounter (HOSPITAL_COMMUNITY): Payer: Self-pay

## 2013-07-17 NOTE — Telephone Encounter (Signed)
I meant to say is 2/24 okay since patient has Monday labs drawn Thanks [place]

## 2013-07-17 NOTE — Telephone Encounter (Signed)
Dr. Baxter Flattery, This patient cannot come tomorrow.  Is 2/23 okay to reschedule? Thanks Roselyn Reef

## 2013-07-17 NOTE — Telephone Encounter (Signed)
Sure 2/24

## 2013-07-18 ENCOUNTER — Ambulatory Visit: Payer: Medicare Other | Admitting: Internal Medicine

## 2013-07-19 ENCOUNTER — Other Ambulatory Visit (INDEPENDENT_AMBULATORY_CARE_PROVIDER_SITE_OTHER): Payer: Self-pay

## 2013-07-19 ENCOUNTER — Other Ambulatory Visit: Payer: Self-pay | Admitting: *Deleted

## 2013-07-19 ENCOUNTER — Telehealth (INDEPENDENT_AMBULATORY_CARE_PROVIDER_SITE_OTHER): Payer: Self-pay | Admitting: *Deleted

## 2013-07-19 MED ORDER — OXYCODONE HCL 5 MG PO TABS: 5.0000 mg | ORAL_TABLET | ORAL | Status: DC | PRN

## 2013-07-19 NOTE — Telephone Encounter (Signed)
OK to renew #[phone]

## 2013-07-19 NOTE — Telephone Encounter (Signed)
Patient called to ask for a refill of Oxycodone.  Patient states surgical related pain being the reason.  Patient denies any signs or symptoms of infection at this time.  Explained that I will send a message to Dr. Johney Maine to make him aware and as soon as we have a decision we will let him know.  Patient had a loop ileostomy takedown on 06/28/13.  Patient states understanding and agreeable at this time.

## 2013-07-19 NOTE — Telephone Encounter (Signed)
Called pt to let him know his prescription is up front for pick up.

## 2013-07-22 ENCOUNTER — Ambulatory Visit (HOSPITAL_BASED_OUTPATIENT_CLINIC_OR_DEPARTMENT_OTHER): Payer: Medicare Other | Admitting: Nurse Practitioner

## 2013-07-22 ENCOUNTER — Telehealth: Payer: Self-pay | Admitting: Oncology

## 2013-07-22 ENCOUNTER — Other Ambulatory Visit: Payer: Medicare Other

## 2013-07-22 VITALS — BP 138/71 | HR 82 | Temp 97.7°F | Resp 18 | Ht 70.0 in | Wt 178.9 lb

## 2013-07-22 DIAGNOSIS — R110 Nausea: Secondary | ICD-10-CM

## 2013-07-22 DIAGNOSIS — C20 Malignant neoplasm of rectum: Secondary | ICD-10-CM

## 2013-07-22 DIAGNOSIS — I10 Essential (primary) hypertension: Secondary | ICD-10-CM

## 2013-07-22 DIAGNOSIS — L270 Generalized skin eruption due to drugs and medicaments taken internally: Secondary | ICD-10-CM

## 2013-07-22 DIAGNOSIS — Z932 Ileostomy status: Secondary | ICD-10-CM

## 2013-07-22 DIAGNOSIS — G479 Sleep disorder, unspecified: Secondary | ICD-10-CM

## 2013-07-22 DIAGNOSIS — R911 Solitary pulmonary nodule: Secondary | ICD-10-CM

## 2013-07-22 DIAGNOSIS — I82409 Acute embolism and thrombosis of unspecified deep veins of unspecified lower extremity: Secondary | ICD-10-CM

## 2013-07-22 MED ORDER — LORAZEPAM 0.5 MG PO TABS: 0.5000 mg | ORAL_TABLET | Freq: Every evening | ORAL | Status: DC | PRN

## 2013-07-22 MED ORDER — OXYCODONE HCL 5 MG PO TABS: 5.0000 mg | ORAL_TABLET | ORAL | Status: DC | PRN

## 2013-07-22 NOTE — Progress Notes (Signed)
OFFICE PROGRESS NOTE  Interval history:  Mr. heard returns for scheduled followup. He underwent an ileostomy takedown procedure on 06/28/2013. He was readmitted on 07/06/2013 with enterococcus bacteremia. He is completing a course of home IV antibiotics.  Energy level is slowly improving. He reports a good appetite. No nausea or vomiting. Erratic bowel habits. He reports pain at the surgical incision and related to the previous herpes zoster infection. He is taking oxycodone about every 4 hours. He is having difficulty sleeping. He attributes this to napping during the day. He is taking Valium at bedtime with no improvement. He continues to note numbness/tingling in the hands and feet. Feet alternately feel hot and cold.   Objective: Filed Vitals:   07/22/13 1330  BP: 138/71  Pulse: 82  Temp: 97.7 F (36.5 C)  Resp: 18   No thrush or ulcerations. Lungs are clear. Regular cardiac rhythm. Abdomen is soft; mildly tender at the ileostomy reversal site. Incision appears well-healed. No surrounding erythema. No hepatomegaly. No leg edema. Left upper extremity PICC site is mildly pink. Nontender. No arm edema.   Lab Results: Lab Results  Component Value Date   WBC 4.8 07/09/2013   HGB 7.6* 07/11/2013   HCT 22.8* 07/11/2013   MCV 105.8* 07/09/2013   PLT 184 07/09/2013   NEUTROABS 9.7* 07/05/2013    Chemistry:    Chemistry      Component Value Date/Time   NA 134* 07/07/2013 0526   NA 142 05/21/2013 1139   K 3.9 07/11/2013 0435   K 3.9 05/21/2013 1139   CL 101 07/07/2013 0526   CO2 23 07/07/2013 0526   CO2 28 05/21/2013 1139   BUN 15 07/07/2013 0526   BUN 11.1 05/21/2013 1139   CREATININE 1.01 07/11/2013 0435   CREATININE 0.9 05/21/2013 1139   CREATININE 1.13 01/14/2013 1155      Component Value Date/Time   CALCIUM 7.9* 07/07/2013 0526   CALCIUM 9.8 05/21/2013 1139   ALKPHOS 97 07/05/2013 2225   ALKPHOS 126 05/21/2013 1139   AST 25 07/05/2013 2225   AST 51* 05/21/2013 1139   ALT 31 07/05/2013 2225    ALT 47 05/21/2013 1139   BILITOT 0.4 07/05/2013 2225   BILITOT 0.75 05/21/2013 1139       Studies/Results: Ct Abdomen Pelvis W Contrast  07/07/2013   CLINICAL DATA:  History of rectal carcinoma. Status post recent ileostomy take-down. Positive blood cultures. Normal white blood cell count. Patient was chills and fever. Patient has positive blood cultures.  EXAM: CT ABDOMEN AND PELVIS WITH CONTRAST  TECHNIQUE: Multidetector CT imaging of the abdomen and pelvis was performed using the standard protocol following bolus administration of intravenous contrast.  CONTRAST:  24m OMNIPAQUE IOHEXOL 300 MG/ML SOLN, 1042mOMNIPAQUE IOHEXOL 300 MG/ML SOLN  COMPARISON:  08/02/2012  FINDINGS: There is irregular fluid attenuation associated with hazy and reticular inflammatory change in the subcutaneous soft tissues of the right lower quadrant. The irregular fluid measures approximately 3.6 cm x 2.2 cm x 0.5 cm. This may reflect a small area of sterile postoperative fluid with associated edema. An early abscess is possible.  There is presacral edema and mild stranding in the fat of the pelvis that is all felt to be post surgical in origin. There is no intra-abdominal collection/abscess.  The appendix is dilated to 9 mm. There is no significant associated inflammatory change. This could potentially reflect an early acute appendicitis, but is not conclusive.  The ileal anastomosis in the right lower quadrant is unremarkable.  No bowel wall thickening or inflammatory change. No bowel dilation to suggest obstruction or significant ileus.  Minimal lung base subsegmental atelectasis. Lung bases are otherwise clear.  Liver and spleen are unremarkable.  Multiple gallstones. No evidence of acute cholecystitis. No bile duct dilation. Normal pancreas. No adrenal masses.  Small renal low-density lesions, likely cysts. Kidneys are otherwise unremarkable. Normal ureters. Normal bladder. Prostate is enlarged but stable.  No pathologically  enlarged lymph nodes.  No ascites.  IMPRESSION: 1. There are 2 potential sources for positive blood cultures. 2. There is a small irregular fluid collection within the subcutaneous fat of the right lower quadrant that could reflect a poorly defined abscess with associated cellulitis. It may simply reflect sterile postoperative fluid and edema. 3. The appendix is dilated 9 mm. This could reflect an early acute appendicitis but is not conclusive. 4. There is no intra-abdominal or pelvic abscess. Mild pelvic fat stranding and presacral edema is consistent with the expected postsurgical change. 5. No evidence of bowel inflammation or obstruction. No adynamic ileus. 6. Other chronic findings as described are stable from the prior CT.   Electronically Signed   By: Lajean Manes M.D.   On: 07/07/2013 09:00   Dg Abd Acute W/chest  07/05/2013   CLINICAL DATA:  Fever after recent ileostomy take-down  EXAM: ACUTE ABDOMEN SERIES (ABDOMEN 2 VIEW & CHEST 1 VIEW)  COMPARISON:  08/02/2012 chest CT  FINDINGS: Normal heart size and mediastinal contours. No acute infiltrate or edema. No effusion or pneumothorax. There is minimal atelectasis or scar at the peripheral left base. Previously noted pulmonary nodules are not visible. No acute osseous findings.  No gas dilated bowel. No abnormal fluid levels. Stool is present within the proximal colon, reassuring finding after ileostomy takedown. Negative for pneumoperitoneum.  IMPRESSION: Negative abdominal radiographs.  No evidence of pneumonia.   Electronically Signed   By: Jorje Guild M.D.   On: 07/05/2013 23:28    Medications: I have reviewed the patient's current medications.  Assessment/Plan: 1. Stage III rectal cancer, presenting with a locally advanced rectal tumor.  CT/MRI evidence of perirectal lymphadenopathy.  Status post neoadjuvant Xeloda and radiation.  Status post low anterior resection and coloanal anastomosis 12/27/2012 with pathology confirming a ypT3,  ypN1b tumor, microsatellite stable.  Initiation of adjuvant CAPOX chemotherapy 01/30/2013.  Cycle 2 adjuvant CAPOX 02/20/2013.  Cycle 3 adjuvant CAPOX 03/13/2013.  Cycle 4 adjuvant CAPOX 04/03/2013 (oxaliplatin dose reduced with cycle 4 due to neuropathy, Xeloda dose reduced with cycle 4 due to hand-foot syndrome).  Cycle 5 adjuvant CAPOX 04/24/2013. 2. Small bilateral lung nodules and a mildly enlarged gastrohepatic node on the initial staging CT scans 08/02/2012.  CT chest on 01/23/2013 with stable bilateral pulmonary nodules. 3. Elevated pretreatment CEA.  Repeat CEA 1.1 on 01/21/2013. 4. Mitral regurgitation. 5. Hypertension. 6. History of rheumatic fever. 7. Pain and swelling at the right arm on 01/18/2013. Doppler confirmed a right upper extremity deep vein thrombosis.  Daily Lovenox started on 01/18/2013.  8. PICC and Lovenox discontinued after an office visit 05/21/2013.  9. Acute herpes zoster affecting right thoracic dermatomes September 2014. He completed a course of valacyclovir. 10. Pain related to herpes zoster. Improved. 11. Delayed nausea following cycle 1 CAPOX. Aloxi added to the premedication regimen beginning with cycle 2. 12. Diarrhea following cycle 2 CAPOX. 13. History of hand/foot syndrome secondary to Xeloda. 14. Ileostomy takedown procedure 06/28/2013. 15. Enterococcus bacteremia, probable endocarditis 07/05/2013. He is completing a course of outpatient antibiotics under the  direction of infectious disease. 16. PICC line placement for IV antibiotics 07/11/2013. 17. Anticoagulation with Lovenox while PICC line in place. 18. Sleep disturbance. He will try to remain awake during the day. He will try Ativan 0.5 mg one to 2 tablets at bedtime as needed for sleep.   Dispositon-he appears stable. He is unsure when the course of IV antibiotics will be completed. He and his sons understand that he needs to continue the Lovenox injections until the PICC line has been  removed.   He will try to wean the pain medication as tolerated.  We will schedule a followup visit in 3 months with a CEA. He will contact the office in the interim with any problems.   25 minutes were spent face-to-face at today's visit with the majority of the time involved in counseling/coordination of care.   Ned Card ANP/GNP-BC

## 2013-07-22 NOTE — Telephone Encounter (Signed)
Gave pt appt for lab and MD on May 2015 °

## 2013-07-23 ENCOUNTER — Encounter (INDEPENDENT_AMBULATORY_CARE_PROVIDER_SITE_OTHER): Payer: Medicare Other | Admitting: Surgery

## 2013-07-24 ENCOUNTER — Ambulatory Visit (INDEPENDENT_AMBULATORY_CARE_PROVIDER_SITE_OTHER): Payer: Medicare Other | Admitting: Surgery

## 2013-07-24 ENCOUNTER — Encounter (INDEPENDENT_AMBULATORY_CARE_PROVIDER_SITE_OTHER): Payer: Self-pay | Admitting: Surgery

## 2013-07-24 VITALS — BP 118/62 | Resp 16 | Ht 70.0 in | Wt 179.0 lb

## 2013-07-24 DIAGNOSIS — K624 Stenosis of anus and rectum: Secondary | ICD-10-CM

## 2013-07-24 DIAGNOSIS — C20 Malignant neoplasm of rectum: Secondary | ICD-10-CM

## 2013-07-24 DIAGNOSIS — I330 Acute and subacute infective endocarditis: Secondary | ICD-10-CM

## 2013-07-24 DIAGNOSIS — Z932 Ileostomy status: Secondary | ICD-10-CM

## 2013-07-24 MED ORDER — OXYCODONE HCL 5 MG PO TABS: 5.0000 mg | ORAL_TABLET | ORAL | Status: DC | PRN

## 2013-07-24 NOTE — Progress Notes (Signed)
Subjective:     Patient ID: Tyler Diaz, male   DOB: Jul 26, 1945, 68 y.o.   MRN: [id number]  HPI  Note: This dictation was prepared with Dragon/digital dictation along with [employer]. Any transcriptional errors that result from this process are unintentional.       ZAHKI HOOGENDOORN  1946/05/04 [id number]  Patient Care Team: Chipper Herb, MD as PCP - General (Family Medicine) Adin Hector, MD as Consulting Physician (Colon and Rectal Surgery) Lear Ng, MD as Consulting Physician (Gastroenterology) Gatha Mayer, MD as Consulting Physician (Radiation Oncology) Carola Frost, RN as Registered Nurse (Oncology) [name], MD as Consulting Physician (Oncology) Carlyle Basques, MD as Consulting Physician (Infectious Diseases)  Procedure (Date: 06/28/2013):  POST-OPERATIVE DIAGNOSIS:  Rectal cancer Status post low anterior resection, coloanal anastomosis, diverting loop ileostomy.  Stricture of coloanal anastomosis.   PROCEDURE: Procedure(s):  loop ILEOSTOMY TAKEDOWN  Dilation of coloanal anastomosis  Examination of anesthesia   SURGEON: Surgeon(s):  Adin Hector, MD  Edward Jolly, MD - Asst   This patient returns for surgical re-evaluation.  He is gradually recovering.  He was readmitted for some concerns of dehydration and fever.  CT scan showed no abscess.  Small seroma.  No wound infection.  Positive blood cultures.  Echocardiogram suspicious for endocarditis.  Started on IV antibiotics.  Infectious disease consultation made.  Recommendation made for 4 weeks of IV antibiotics to finish 05 August 2013.  He has a PICC line.  He is receiving Lovenox given his history of DVT with the last PICC line.  He is eating okay.  Some fecal urgency but no incontinence.  Occasionally mild loose stools.  He comes today with his son.  Walking more.  Appetite okay.  Wondering if he can keep more regular foods.  No fevers or chills now.  No shingles or skin  problems.  No bleeding from his ostomy site.  Patient Active Problem List   Diagnosis Date Noted  . Bacterial endocarditis 07/06/2013  . Stricture of coloanal anastomosis 04/17/2013  . Anemia in neoplastic disease 01/14/2013  . Bilateral inguinal hernia (BIH) 01/03/2013  . Mitral regurgitation due to partially flail posterior mitral valve leaflet 07/30/2012  . Hyperlipidemia   . Rectal cancer - distal s/p lap LAR/coloanal July 2014 - [id number]. 07/27/2012  . Hypertension   . Vertigo     Past Medical History  Diagnosis Date  . Mitral regurgitation due to partially flail posterior mitral valve leaflet   . Rectal cancer   . Hyperlipidemia   . Colon cancer 07/18/12 bx    rectum=Invasive adenocarcinoma w/ extracellular mucin,polyp=benign  . Rheumatic fever as child    hx  . Hearing loss     partial greater on left  . Tinnitus     left ear  . Sinusitis     seasonal  . Arthritis     hands/knees  . Hematochezia     recent  . Heart murmur   . Hypothyroidism as child    hx of  . Numbness     TOES / FINGERS DUE TO CHEMO  . History of shingles   . Blood clot in vein     RT ARM WITH PICC LINE   . GERD (gastroesophageal reflux disease)     OCCASIONAL  . Nocturia   . Radiation     FOR COLON CANCER  . Umbilical hernia, incarcerated - s/p primary repair July 2014 01/03/2013  . Herpes zoster - R flank -  with severe pain 03/11/2013  . Ileostomy - diverting loop - s/p takedown 06/28/2013 01/14/2013    Past Surgical History  Procedure Laterality Date  . Carpal tunnel release Right 2002  . Tonsillectomy      as child  . Laparoscopic low anterior resection N/A 12/27/2012    Procedure: LAPAROSCOPIC LOW ANTERIOR RESECTION, COLO-ANAL ANASTOMOSIS, DIVERTING LOOP ILEOSTOMY,SPLENIC FLEXURE MOBILIZATION, PRIMARY INCARCERATED UMBILICAL HERNIA REPAIR;  Surgeon: Adin Hector, MD;  Location: [facility];  Service: General;  Laterality: N/A;  . Laparoscopic low anterior rescection with coloanal  anastomosis N/A 12/27/2012    Procedure: LAPAROSCOPIC LOW ANTERIOR RESCECTION WITH COLOANAL ANASTOMOSIS;  Surgeon: Adin Hector, MD;  Location: [facility];  Service: General;  Laterality: N/A;  . Ileostomy N/A 12/27/2012    Procedure: DIVERTING LOOP ILEOSTOMY;  Surgeon: Adin Hector, MD;  Location: [facility];  Service: General;  Laterality: N/A;  . Umbilical hernia repair  12/27/2012    Procedure: PRIMARY REPAIR INCARCERATED UMBILICAL HERNIA;  Surgeon: Adin Hector, MD;  Location: [facility];  Service: General;;  . Hernia repair    . Ileostomy closure N/A 06/28/2013    Procedure: loop ILEOSTOMY TAKEDOWN examination of anesthesia ;  Surgeon: Adin Hector, MD;  Location: [facility];  Service: General;  Laterality: N/A;  . Tee without cardioversion N/A 07/10/2013    Procedure: TRANSESOPHAGEAL ECHOCARDIOGRAM (TEE);  Surgeon: Candee Furbish, MD;  Location: [facility];  Service: Cardiovascular;  Laterality: N/A;    History   Social History  . Marital Status: Married    Spouse Name: N/A    Number of Children: 2  . Years of Education: N/A   Occupational History  . Retired     Chiropractor   Social History Main Topics  . Smoking status: Former Smoker -- 1.50 packs/day for 20 years    Types: Cigarettes    Quit date: 07/27/1972  . Smokeless tobacco: Never Used  . Alcohol Use: No  . Drug Use: No     Comment: quit smoking 35 years ago  . Sexual Activity: Not on file   Other Topics Concern  . 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  Married    Family History  Problem Relation Age of Onset  . Cancer Mother     abdominal ca ?ovarian?  . Cancer Sister     liver cancer    Current Outpatient Prescriptions  Medication Sig Dispense Refill  . acetaminophen (TYLENOL) 325 MG tablet Take 650 mg by mouth every 6 (six) hours as needed for mild pain.      Marland Kitchen acetaminophen-codeine (TYLENOL #3) 300-30 MG per tablet Take 1-2 tablets by mouth  every 4 (four) hours as needed for moderate pain.       Marland Kitchen ampicillin (OMNIPEN) 2 G injection       . diazepam (VALIUM) 2 MG tablet       . diphenoxylate-atropine (LOMOTIL) 2.5-0.025 MG per tablet       . enoxaparin (LOVENOX) 120 MG/0.8ML injection Inject 0.4 mLs (60 mg total) into the skin daily.  0 Syringe    . ferrous sulfate 325 (65 FE) MG tablet Take 1 tablet (325 mg total) by mouth 2 (two) times daily with a meal.  60 tablet  1  . gentamicin (GARAMYCIN) 1.6-0.9 MG/ML-% Inject 50 mLs (80 mg total) into the vein every 8 (eight) hours.  4200 mL  0  . hydrocortisone (ANUSOL-HC) 2.5 %  rectal cream Place 1 application rectally 2 (two) times daily as needed for hemorrhoids.      Marland Kitchen loperamide (IMODIUM) 2 MG capsule       . LORazepam (ATIVAN) 0.5 MG tablet Take 1-2 tablets (0.5-1 mg total) by mouth at bedtime as needed for anxiety or sleep.  30 tablet  0  . Multiple Vitamin (MULTIVITAMIN WITH MINERALS) TABS Take 1 tablet by mouth every morning.       Marland Kitchen oxymetazoline (AFRIN) 0.05 % nasal spray Place 1 spray into both nostrils 2 (two) times daily as needed for congestion.      . Pediatric Multiple Vit-C-FA (PEDIATRIC MULTIVITAMIN) chewable tablet Chew 2 tablets by mouth daily.      . polyethylene glycol (MIRALAX / GLYCOLAX) packet Take 17 g by mouth daily.      . promethazine (PHENERGAN) 25 MG tablet Take 25 mg by mouth every 6 (six) hours as needed for nausea.       . sodium chloride 0.9 % SOLN 50 mL with ampicillin 2 G SOLR 2 g Inject 2 g into the vein every 4 (four) hours.  112 each  0   No current facility-administered medications for this visit.     Allergies  Allergen Reactions  . Shrimp [Shellfish Allergy] Hives, Nausea And Vomiting and Rash    BP 118/62  Resp 16  Ht 5\' 10"  (1.778 m)  Wt 179 lb (81.194 kg)  BMI 25.68 kg/m2  Ct Abdomen Pelvis W Contrast  07/07/2013   CLINICAL DATA:  History of rectal carcinoma. Status post recent ileostomy take-down. Positive blood cultures. Normal  white blood cell count. Patient was chills and fever. Patient has positive blood cultures.  EXAM: CT ABDOMEN AND PELVIS WITH CONTRAST  TECHNIQUE: Multidetector CT imaging of the abdomen and pelvis was performed using the standard protocol following bolus administration of intravenous contrast.  CONTRAST:  75mL OMNIPAQUE IOHEXOL 300 MG/ML SOLN, 153mL OMNIPAQUE IOHEXOL 300 MG/ML SOLN  COMPARISON:  08/02/2012  FINDINGS: There is irregular fluid attenuation associated with hazy and reticular inflammatory change in the subcutaneous soft tissues of the right lower quadrant. The irregular fluid measures approximately 3.6 cm x 2.2 cm x 0.5 cm. This may reflect a small area of sterile postoperative fluid with associated edema. An early abscess is possible.  There is presacral edema and mild stranding in the fat of the pelvis that is all felt to be post surgical in origin. There is no intra-abdominal collection/abscess.  The appendix is dilated to 9 mm. There is no significant associated inflammatory change. This could potentially reflect an early acute appendicitis, but is not conclusive.  The ileal anastomosis in the right lower quadrant is unremarkable. No bowel wall thickening or inflammatory change. No bowel dilation to suggest obstruction or significant ileus.  Minimal lung base subsegmental atelectasis. Lung bases are otherwise clear.  Liver and spleen are unremarkable.  Multiple gallstones. No evidence of acute cholecystitis. No bile duct dilation. Normal pancreas. No adrenal masses.  Small renal low-density lesions, likely cysts. Kidneys are otherwise unremarkable. Normal ureters. Normal bladder. Prostate is enlarged but stable.  No pathologically enlarged lymph nodes.  No ascites.  IMPRESSION: 1. There are 2 potential sources for positive blood cultures. 2. There is a small irregular fluid collection within the subcutaneous fat of the right lower quadrant that could reflect a poorly defined abscess with associated  cellulitis. It may simply reflect sterile postoperative fluid and edema. 3. The appendix is dilated 9 mm. This could reflect an  early acute appendicitis but is not conclusive. 4. There is no intra-abdominal or pelvic abscess. Mild pelvic fat stranding and presacral edema is consistent with the expected postsurgical change. 5. No evidence of bowel inflammation or obstruction. No adynamic ileus. 6. Other chronic findings as described are stable from the prior CT.   Electronically Signed   By: Lajean Manes M.D.   On: 07/07/2013 09:00   Dg Abd Acute W/chest  07/05/2013   CLINICAL DATA:  Fever after recent ileostomy take-down  EXAM: ACUTE ABDOMEN SERIES (ABDOMEN 2 VIEW & CHEST 1 VIEW)  COMPARISON:  08/02/2012 chest CT  FINDINGS: Normal heart size and mediastinal contours. No acute infiltrate or edema. No effusion or pneumothorax. There is minimal atelectasis or scar at the peripheral left base. Previously noted pulmonary nodules are not visible. No acute osseous findings.  No gas dilated bowel. No abnormal fluid levels. Stool is present within the proximal colon, reassuring finding after ileostomy takedown. Negative for pneumoperitoneum.  IMPRESSION: Negative abdominal radiographs.  No evidence of pneumonia.   Electronically Signed   By: Jorje Guild M.D.   On: 07/05/2013 23:28    Review of Systems  Constitutional: Negative for fever, chills and diaphoresis.  HENT: Negative for sore throat and trouble swallowing.   Eyes: Negative for photophobia and visual disturbance.  Respiratory: Negative for choking and shortness of breath.   Cardiovascular: Negative for chest pain and palpitations.  Gastrointestinal: Negative for nausea, vomiting, abdominal distention, anal bleeding and rectal pain.  Genitourinary: Negative for dysuria, urgency, difficulty urinating and testicular pain.  Musculoskeletal: Negative for arthralgias, gait problem, myalgias and neck pain.  Skin: Negative for color change and rash.    Neurological: Negative for dizziness, speech difficulty, weakness and numbness.  Hematological: Negative for adenopathy.  Psychiatric/Behavioral: Negative for hallucinations, confusion and agitation.       Objective:   Physical Exam  Constitutional: He is oriented to person, place, and time. He appears well-developed and well-nourished. No distress.  HENT:  Head: Normocephalic.  Mouth/Throat: Oropharynx is clear and moist. No oropharyngeal exudate.  Eyes: Conjunctivae and EOM are normal. Pupils are equal, round, and reactive to light. No scleral icterus.  Neck: Normal range of motion. No tracheal deviation present.  Cardiovascular: Normal rate, normal heart sounds and intact distal pulses.   Pulmonary/Chest: Effort normal. No respiratory distress.  Abdominal: Soft. He exhibits no distension. There is no tenderness. Hernia confirmed negative in the right inguinal area and confirmed negative in the left inguinal area.    Incisions clean with normal healing ridges.  No hernias  Musculoskeletal: Normal range of motion. He exhibits no tenderness.       Arms: Neurological: He is alert and oriented to person, place, and time. No cranial nerve deficit. He exhibits normal muscle tone. Coordination normal.  Skin: Skin is warm and dry. No rash noted. He is not diaphoretic.  Psychiatric: He has a normal mood and affect. His behavior is normal.       Assessment:     Gradually recovering from ileostomy takedown complicated by endocarditis with bacteremia     Plan:     Continue IV antibiotics.  Complete four-week course per infectious disease.  Increase activity as tolerated to regular activity.  Low impact exercise such as walking an hour a day at least ideal.  Do not push through pain.  Diet as tolerated.  Low fat high fiber diet ideal.  Bowel regimen with 30 g fiber a day and fiber supplement as needed to  avoid problems.  Continue Kegel pelvic floor exercises to help strengthen  sphincter.  Consider pelvic floor physical therapy if not improving.  However, he is improving so we will hold off for now.  Keep it simple.  Return to clinic in 3 weeks to make sure that he is improving, sooner as needed.   Instructions discussed.  Followup with primary care physician for other health issues as would normally be done.  Consider screening for malignancies (breast, prostate, colon, melanoma, etc) as appropriate.  Questions answered.  The patient expressed understanding and appreciation

## 2013-07-24 NOTE — Patient Instructions (Signed)
GETTING TO GOOD BOWEL HEALTH. Irregular bowel habits such as constipation and diarrhea can lead to many problems over time.  Having one soft bowel movement a day is the most important way to prevent further problems.  The anorectal canal is designed to handle stretching and feces to safely manage our ability to get rid of solid waste (feces, poop, stool) out of our body.  BUT, hard constipated stools can act like ripping concrete bricks and diarrhea can be a burning fire to this very sensitive area of our body, causing inflamed hemorrhoids, anal fissures, increasing risk is perirectal abscesses, abdominal pain/bloating, an making irritable bowel worse.     The goal: ONE SOFT BOWEL MOVEMENT A DAY!  To have soft, regular bowel movements:    Drink at least 8 tall glasses of water a day.     Take plenty of fiber.  Fiber is the undigested part of plant food that passes into the colon, acting s "natures broom" to encourage bowel motility and movement.  Fiber can absorb and hold large amounts of water. This results in a larger, bulkier stool, which is soft and easier to pass. Work gradually over several weeks up to 6 servings a day of fiber (25g a day even more if needed) in the form of: o Vegetables -- Root (potatoes, carrots, turnips), leafy green (lettuce, salad greens, celery, spinach), or cooked high residue (cabbage, broccoli, etc) o Fruit -- Fresh (unpeeled skin & pulp), Dried (prunes, apricots, cherries, etc ),  or stewed ( applesauce)  o Whole grain breads, pasta, etc (whole wheat)  o Bran cereals    Bulking Agents -- This type of water-retaining fiber generally is easily obtained each day by one of the following:  o Psyllium bran -- The psyllium plant is remarkable because its ground seeds can retain so much water. This product is available as Metamucil, Konsyl, Effersyllium, Per Diem Fiber, or the less expensive generic preparation in drug and health food stores. Although labeled a laxative, it really  is not a laxative.  o Methylcellulose -- This is another fiber derived from wood which also retains water. It is available as Citrucel. o Polyethylene Glycol - and "artificial" fiber commonly called Miralax or Glycolax.  It is helpful for people with gassy or bloated feelings with regular fiber o Flax Seed - a less gassy fiber than psyllium   No reading or other relaxing activity while on the toilet. If bowel movements take longer than 5 minutes, you are too constipated   AVOID CONSTIPATION.  High fiber and water intake usually takes care of this.  Sometimes a laxative is needed to stimulate more frequent bowel movements, but    Laxatives are not a good long-term solution as it can wear the colon out. o Osmotics (Milk of Magnesia, Fleets phosphosoda, Magnesium citrate, MiraLax, GoLytely) are safer than  o Stimulants (Senokot, Castor Oil, Dulcolax, Ex Lax)    o Do not take laxatives for more than 7days in a row.    IF SEVERELY CONSTIPATED, try a Bowel Retraining Program: o Do not use laxatives.  o Eat a diet high in roughage, such as bran cereals and leafy vegetables.  o Drink six (6) ounces of prune or apricot juice each morning.  o Eat two (2) large servings of stewed fruit each day.  o Take one (1) heaping tablespoon of a psyllium-based bulking agent twice a day. Use sugar-free sweetener when possible to avoid excessive calories.  o Eat a normal breakfast.  o   Set aside 15 minutes after breakfast to sit on the toilet, but do not strain to have a bowel movement.  o If you do not have a bowel movement by the third day, use an enema and repeat the above steps.    Controlling diarrhea o Switch to liquids and simpler foods for a few days to avoid stressing your intestines further. o Avoid dairy products (especially milk & ice cream) for a short time.  The intestines often can lose the ability to digest lactose when stressed. o Avoid foods that cause gassiness or bloating.  Typical foods include  beans and other legumes, cabbage, broccoli, and dairy foods.  Every person has some sensitivity to other foods, so listen to our body and avoid those foods that trigger problems for you. o Adding fiber (Citrucel, Metamucil, psyllium, Miralax) gradually can help thicken stools by absorbing excess fluid and retrain the intestines to act more normally.  Slowly increase the dose over a few weeks.  Too much fiber too soon can backfire and cause cramping & bloating. o Probiotics (such as active yogurt, Align, etc) may help repopulate the intestines and colon with normal bacteria and calm down a sensitive digestive tract.  Most studies show it to be of mild help, though, and such products can be costly. o Medicines:   Bismuth subsalicylate (ex. Kayopectate, Pepto Bismol) every 30 minutes for up to 6 doses can help control diarrhea.  Avoid if pregnant.   Loperamide (Immodium) can slow down diarrhea.  Start with two tablets (4mg  total) first and then try one tablet every 6 hours.  Avoid if you are having fevers or severe pain.  If you are not better or start feeling worse, stop all medicines and call your doctor for advice o Call your doctor if you are getting worse or not better.  Sometimes further testing (cultures, endoscopy, X-ray studies, bloodwork, etc) may be needed to help diagnose and treat the cause of the diarrhea.  Pelvic floor muscle training exercises  can help strengthen the muscles under the uterus, bladder, and bowel (large intestine). They can help both men and women who have problems with urine leakage or bowel control.  A pelvic floor muscle training exercise is like pretending that you have to urinate, and then holding it. You relax and tighten the muscles that control urine flow. It's important to find the right muscles to tighten.  The next time you have to urinate, start to go and then stop. Feel the muscles in your vagina, bladder, or anus get tight and move up. These are the pelvic  floor muscles. If you feel them tighten, you've done the exercise right. If you are still not sure whether you are tightening the right muscles, keep in mind that all of the muscles of the pelvic floor relax and contract at the same time. Because these muscles control the bladder, rectum, and vagina, the following tips may help: Women: Insert a finger into your vagina. Tighten the muscles as if you are holding in your urine, then let go. You should feel the muscles tighten and move up and down.  Men: Insert a finger into your rectum. Tighten the muscles as if you are holding in your urine, then let go. You should feel the muscles tighten and move up and down. These are the same muscles you would tighten if you were trying to prevent yourself from passing gas.  It is very important that you keep the following muscles relaxed while doing pelvic  floor muscle training exercises: Abdominal  Buttocks (the deeper, anal sphincter muscle should contract)  Thigh   A woman can also strengthen these muscles by using a vaginal cone, which is a weighted device that is inserted into the vagina. Then you try to tighten the pelvic floor muscles to hold the device in place. If you are unsure whether you are doing the pelvic floor muscle training correctly, you can use biofeedback and electrical stimulation to help find the correct muscle group to work. Biofeedback is a method of positive reinforcement. Electrodes are placed on the abdomen and along the anal area. Some therapists place a sensor in the vagina in women or anus in men to monitor the contraction of pelvic floor muscles.  A monitor will display a graph showing which muscles are contracting and which are at rest. The therapist can help find the right muscles for performing pelvic floor muscle training exercises.   PERFORMING PELVIC FLOOR EXERCISES: 1. Begin by emptying your bladder. 2. Tighten the pelvic floor muscles and hold for a count of 10. 3. Relax  the muscles completely for a count of 10. 4. Do 10 repititions, 3 to 5 times a day (morning, afternoon, and night). You can do these exercises at any time and any place. Most people prefer to do the exercises while lying down or sitting in a chair. After 4 - 6 weeks, most people notice some improvement. It may take as long as 3 months to see a major change. After a couple of weeks, you can also try doing a single pelvic floor contraction at times when you are likely to leak (for example, while getting out of a chair). A word of caution: Some people feel that they can speed up the progress by increasing the number of repetitions and the frequency of exercises. However, over-exercising can instead cause muscle fatigue and increase urine leakage. If you feel any discomfort in your abdomen or back while doing these exercises, you are probably doing them wrong. Breathe deeply and relax your body when you are doing these exercises. Make sure you are not tightening your stomach, thigh, buttock, or chest muscles. When done the right way, pelvic floor muscle exercises have been shown to be very effective at improving urinary continence. Alternative Names Kegel exercises   ABDOMINAL SURGERY: POST OP INSTRUCTIONS  1. DIET: Follow a light bland diet the first 24 hours after arrival home, such as soup, liquids, crackers, etc.  Be sure to include lots of fluids daily.  Avoid fast food or heavy meals as your are more likely to get nauseated.  Eat a low fat the next few days after surgery.   2. Take your usually prescribed home medications unless otherwise directed. 3. PAIN CONTROL: a. Pain is best controlled by a usual combination of three different methods TOGETHER: i. Ice/Heat ii. Over the counter pain medication iii. Prescription pain medication b. Most patients will experience some swelling and bruising around the incisions.  Ice packs or heating pads (30-60 minutes up to 6 times a day) will help. Use ice  for the first few days to help decrease swelling and bruising, then switch to heat to help relax tight/sore spots and speed recovery.  Some people prefer to use ice alone, heat alone, alternating between ice & heat.  Experiment to what works for you.  Swelling and bruising can take several weeks to resolve.   c. It is helpful to take an over-the-counter pain medication regularly for the first few  weeks.  Choose one of the following that works best for you: i. Naproxen (Aleve, etc)  Two 220mg  tabs twice a day ii. Ibuprofen (Advil, etc) Three 200mg  tabs four times a day (every meal & bedtime) iii. Acetaminophen (Tylenol, etc) 500-650mg  four times a day (every meal & bedtime) d. A  prescription for pain medication (such as oxycodone, hydrocodone, etc) should be given to you upon discharge.  Take your pain medication as prescribed.  i. If you are having problems/concerns with the prescription medicine (does not control pain, nausea, vomiting, rash, itching, etc), please call us [phone] to see if we need to switch you to a different pain medicine that will work better for you and/or control your side effect better. ii. If you need a refill on your pain medication, please contact your pharmacy.  They will contact our office to request authorization. Prescriptions will not be filled after 5 pm or on week-ends. 4. Avoid getting constipated.  Between the surgery and the pain medications, it is common to experience some constipation.  Increasing fluid intake and taking a fiber supplement (such as Metamucil, Citrucel, FiberCon, MiraLax, etc) 1-2 times a day regularly will usually help prevent this problem from occurring.  A mild laxative (prune juice, Milk of Magnesia, MiraLax, etc) should be taken according to package directions if there are no bowel movements after 48 hours.   5. Watch out for diarrhea.  If you have many loose bowel movements, simplify your diet to bland foods & liquids for a few days.  Stop  any stool softeners and decrease your fiber supplement.  Switching to mild anti-diarrheal medications (Kayopectate, Pepto Bismol) can help.  If this worsens or does not improve, please call us. 6. Wash / shower every day.  You may shower over the incision / wound.  Avoid baths until the skin is fully healed.  Continue to shower over incision(s) after the dressing is off. 7. Remove your waterproof bandages 5 days after surgery.  You may leave the incision open to air.  You may replace a dressing/Band-Aid to cover the incision for comfort if you wish. 8. ACTIVITIES as tolerated:   a. You may resume regular (light) daily activities beginning the next day-such as daily self-care, walking, climbing stairs-gradually increasing activities as tolerated.  If you can walk 30 minutes without difficulty, it is safe to try more intense activity such as jogging, treadmill, bicycling, low-impact aerobics, swimming, etc. b. Save the most intensive and strenuous activity for last such as sit-ups, heavy lifting, contact sports, etc  Refrain from any heavy lifting or straining until you are off narcotics for pain control.   c. DO NOT PUSH THROUGH PAIN.  Let pain be your guide: If it hurts to do something, don't do it.  Pain is your body warning you to avoid that activity for another week until the pain goes down. d. You may drive when you are no longer taking prescription pain medication, you can comfortably wear a seatbelt, and you can safely maneuver your car and apply brakes. e. Dennis Bast may have sexual intercourse when it is comfortable.  9. FOLLOW UP in our office a. Please call [facility] at [phone] to set up an appointment to see your surgeon in the office for a follow-up appointment approximately 1-2 weeks after your surgery. b. Make sure that you call for this appointment the day you arrive home to insure a convenient appointment time. 10. IF YOU HAVE DISABILITY OR FAMILY LEAVE FORMS, BRING THEM  TO THE OFFICE FOR  PROCESSING.  DO NOT GIVE THEM TO YOUR DOCTOR.   WHEN TO CALL us [phone]: 1. Poor pain control 2. Reactions / problems with new medications (rash/itching, nausea, etc)  3. Fever over 101.5 F (38.5 C) 4. Inability to urinate 5. Nausea and/or vomiting 6. Worsening swelling or bruising 7. Continued bleeding from incision. 8. Increased pain, redness, or drainage from the incision  The clinic staff is available to answer your questions during regular business hours (8:30am-5pm).  Please don't hesitate to call and ask to speak to one of our nurses for clinical concerns.   A surgeon from [facility] is always on call at the hospitals   If you have a medical emergency, go to the nearest emergency room or call 911.    [facility], [address], [place], [place], [place]  [address] ? MAIN: [phone] ? TOLL FREE: [phone] ? FAX [phone] [email or web address]     Infective Endocarditis Infective endocarditis is an infection of the inner layer of heart (endocardium) or the heart valves. An endocarditis infection is caused by many things, including viruses, bacteria, or fungi that enter your bloodstream. If left untreated, endocarditis can cause other problems, which may include:  Heart valve damage.  Blood clots (embolism).  Irregular heartbeat (arrhythmia).  Heart failure. CAUSES  Surgery, dental work, and intravenous (IV) drug abuse can allow bacteria or fungi to enter the bloodstream. Once in the bloodstream, bacteria can attach to the inner lining of the heart or heart valves, causing infection. SYMPTOMS   Fever and chills.  Skin rashes.  Heart murmurs.  Spleen enlargement.  Unexplained weight loss.  Shortness of breath.  Blood in urine.  Lethargy and fatigue.  Sudden weakness in face, arms, legs (possible stroke).  Night sweats. DIAGNOSIS   Endocarditis may be suspected if your caregiver hears a  heart murmur, observes certain skin rashes, or there are changes in your eye exam.  Tests include:  Blood work.  Transthoracic echocardiogram (TTE). A TTE uses sound waves to produce images of the heart. A hand-held device is placed on the chest and transmits sound waves. These sound waves bounce off the heart to produce images that help your caregiver detect heart damage.  Transesophageal echocardiogram (TEE). For a TEE, a flexible tube is passed down your throat and into the esophagus. Because the esophagus is close to the heart, a TEE allows different images of the heart to be obtained. This type of procedure requires sedation. WHO IS AT RISK FOR INFECTIVE ENDOCARDITIS?  Those with a history of endocarditis.  People with artificial (prosthetic) heart valves.  Cardiac transplant patients with valve disease.  People with congenital heart disease, including:  Those with unrepaired congenital heart defects.  Those with a congenital heart defect repaired with prosthetic materials. Within the first 6 months of the procedure, you are still at risk for infection. After 6 months, talk to your caregiver about your special needs.  Those who have had surgery to correct the defect but with some remaining problems at the site of repair. PREVENTION  If you are at the highest risk for infective endocarditis, you may need to take antibiotics before having dental work or other surgical procedures. These medicines help to prevent infective endocarditis. Let your dentist and your caregiver know if you have a history of any of the following so that the necessary precautions can be taken:  A ventricular septal defect (VSD).  A repaired VSD.  Endocarditis in the past.  A prosthetic heart valve. TREATMENT   IV antibiotic treatment may be needed for several weeks to treat endocarditis.  Surgery may be needed to remove the infected tissue in the heart.  Surgery to repair valve damage. MAKE SURE  YOU:   Understand these instructions.  Will watch your condition.  Will get help right away if you are not doing well or get worse. Document Released: 05/23/2005 Document Revised: 09/17/2012 Document Reviewed: 10/04/2006 [facility] Patient Information 2014 [place], [place].

## 2013-07-25 NOTE — Discharge Summary (Signed)
Physician Discharge Summary  Patient ID: Tyler Diaz MRN: [id number] DOB/AGE: [birth year] 68 y.o.  Admit date: 07/06/2013 Discharge date: 07/11/2013  Admission Diagnoses:  Discharge Diagnoses:  Active Problems:   Rectal cancer - distal s/p lap LAR/coloanal July 2014 - [id number].   Hypertension   Mitral regurgitation due to partially flail posterior mitral valve leaflet   Stricture of coloanal anastomosis   Bacterial endocarditis   Discharged Condition: good  Hospital Course: Pleasant gentleman with distal rectal cancer.  Has undergone neoadjuvant chemotherapy and low anterior resection with diverting loop ileostomy.  Complicated with DVT and shingles.  Eventually recovered.  Off anticoagulation now.  Loop ileostomy takedown.  Develop high fevers at home.  Came to the emergency room.  Was admitted.  Placed on IV antibiotics.  Positive blood cultures.  Infectious disease consulted.  Echocardiogram suspicious for endocarditis.  CT no abscess - small incisional seroma  Plan placed.  IV antibiotics recommended for 4 weeks.  By the time of discharge, he was walking the hallways.  He was eating solid food.  He had some loose stools but no horrible incontinence and was improving.  Discussions were made with infectious disease and hematology/oncology.  He will be discharged with close followup.  Consults:  Infectious disease (Dr. Drue Second).   Heme/Onc - Dr Truett Perna.   Cardiology Dr Anne Fu  Significant Diagnostic Studies:   Ct Abdomen Pelvis W Contrast  07/07/2013   CLINICAL DATA:  History of rectal carcinoma. Status post recent ileostomy take-down. Positive blood cultures. Normal white blood cell count. Patient was chills and fever. Patient has positive blood cultures.  EXAM: CT ABDOMEN AND PELVIS WITH CONTRAST  TECHNIQUE: Multidetector CT imaging of the abdomen and pelvis was performed using the standard protocol following bolus administration of intravenous contrast.  CONTRAST:  50mL  OMNIPAQUE IOHEXOL 300 MG/ML SOLN, OMNIPAQUE IOHEXOL 300 MG/ML SOLN  COMPARISON:  08/02/2012  FINDINGS: There is irregular fluid attenuation associated with hazy and reticular inflammatory change in the subcutaneous soft tissues of the right lower quadrant. The irregular fluid measures approximately 3.6 cm x 2.2 cm x 0.5 cm. This may reflect a small area of sterile postoperative fluid with associated edema. An early abscess is possible.  There is presacral edema and mild stranding in the fat of the pelvis that is all felt to be post surgical in origin. There is no intra-abdominal collection/abscess.  The appendix is dilated to 9 mm. There is no significant associated inflammatory change. This could potentially reflect an early acute appendicitis, but is not conclusive.  The ileal anastomosis in the right lower quadrant is unremarkable. No bowel wall thickening or inflammatory change. No bowel dilation to suggest obstruction or significant ileus.  Minimal lung base subsegmental atelectasis. Lung bases are otherwise clear.  Liver and spleen are unremarkable.  Multiple gallstones. No evidence of acute cholecystitis. No bile duct dilation. Normal pancreas. No adrenal masses.  Small renal low-density lesions, likely cysts. Kidneys are otherwise unremarkable. Normal ureters. Normal bladder. Prostate is enlarged but stable.  No pathologically enlarged lymph nodes.  No ascites.  IMPRESSION: 1. There are 2 potential sources for positive blood cultures. 2. There is a small irregular fluid collection within the subcutaneous fat of the right lower quadrant that could reflect a poorly defined abscess with associated cellulitis. It may simply reflect sterile postoperative fluid and edema. 3. The appendix is dilated 9 mm. This could reflect an early acute appendicitis but is not conclusive. 4. There is no intra-abdominal or pelvic abscess. Mild  pelvic fat stranding and presacral edema is consistent with the expected  postsurgical change. 5. No evidence of bowel inflammation or obstruction. No adynamic ileus. 6. Other chronic findings as described are stable from the prior CT.   Electronically Signed   By: Amie Portland M.D.   On: 07/07/2013 09:00   Indications: Enterococcal bacteremia  Findings:  Partial flail P2, posterior mitral valve leaflet (present since 2003) with severe anterior directed mitral regurgitation.  Can not exclude small vegetation on the posterior mitral leaflet due to shaggy appearance of flail leaflet. Would suggest treatment for endocarditis.  No evidence of ring abscess.  Normal EF.  Spoke with Dr. Donnie Aho, his cardiologist, who has been following his mitral valve for greater than 10 years and partially flail posterior leaflet has been present through that time period. He has not desired surgery.        Treatments: antibiotics:   Discharge Exam: Blood pressure 121/69, pulse 97, temperature 98.1 F (36.7 C), temperature source Oral, resp. rate 18, height 5\' 10"  (1.778 m), weight 185 lb 9.6 oz (84.188 kg), SpO2 97.00%.  General: Pt awake/alert/oriented x4 in no major acute distress Eyes: PERRL, normal EOM. Sclera nonicteric Neuro: CN II-XII intact w/o focal sensory/motor deficits. Lymph: No head/neck/groin lymphadenopathy Psych:  No delerium/psychosis/paranoia HENT: Normocephalic, Mucus membranes moist.  No thrush Neck: Supple, No tracheal deviation Chest: No pain.  Good respiratory excursion. CV:  Pulses intact.  Regular rhythm MS: Normal AROM mjr joints.  No obvious deformity Abdomen: Soft, Nondistended.  Nontender.  No incarcerated hernias.  Healing ridge at ileostomy incision Ext:  SCDs BLE.  No significant edema.  No cyanosis Skin: No petechiae / purpura   Disposition: 01-Home or Self Care   Future Appointments Provider Department Dept Phone   07/30/2013 10:15 AM Judyann Munson, MD [facility] [phone]   08/14/2013 10:00 AM  Ardeth Sportsman, MD [facility], [place] [phone]   10/17/2013 9:30 AM [facility] Only [facility] [phone]   10/17/2013 10:00 AM Ladene Artist, MD [facility] [phone]       Medication List    STOP taking these medications       ibuprofen 200 MG tablet  Commonly known as:  ADVIL,MOTRIN      TAKE these medications       acetaminophen 325 MG tablet  Commonly known as:  TYLENOL  Take 650 mg by mouth every 6 (six) hours as needed for mild pain.     ferrous sulfate 325 (65 FE) MG tablet  Take 1 tablet (325 mg total) by mouth 2 (two) times daily with a meal.     gentamicin 1.6-0.9 MG/ML-%  Commonly known as:  GARAMYCIN  Inject 50 mLs (80 mg total) into the vein every 8 (eight) hours.     hydrocortisone 2.5 % rectal cream  Commonly known as:  ANUSOL-HC  Place 1 application rectally 2 (two) times daily as needed for hemorrhoids.     multivitamin with minerals Tabs tablet  Take 1 tablet by mouth every morning.     oxymetazoline 0.05 % nasal spray  Commonly known as:  AFRIN  Place 1 spray into both nostrils 2 (two) times daily as needed for congestion.     pediatric multivitamin chewable tablet  Chew 2 tablets by mouth daily.     polyethylene glycol packet  Commonly known as:  MIRALAX / GLYCOLAX  Take 17 g by mouth daily.  promethazine 25 MG tablet  Commonly known as:  PHENERGAN  Take 25 mg by mouth every 6 (six) hours as needed for nausea.     sodium chloride 0.9 % SOLN 50 mL with ampicillin 2 G SOLR 2 g  Inject 2 g into the vein every 4 (four) hours.         Signed: Elienai Gailey C. 07/25/2013, 9:50 AM

## 2013-07-30 ENCOUNTER — Ambulatory Visit (INDEPENDENT_AMBULATORY_CARE_PROVIDER_SITE_OTHER): Payer: Medicare Other | Admitting: Internal Medicine

## 2013-07-30 ENCOUNTER — Encounter: Payer: Self-pay | Admitting: Internal Medicine

## 2013-07-30 VITALS — BP 134/79 | HR 83 | Temp 97.2°F | Wt 179.0 lb

## 2013-07-30 DIAGNOSIS — IMO0001 Reserved for inherently not codable concepts without codable children: Secondary | ICD-10-CM

## 2013-07-30 DIAGNOSIS — M7918 Myalgia, other site: Secondary | ICD-10-CM

## 2013-07-30 MED ORDER — ACETAMINOPHEN-CODEINE #3 300-30 MG PO TABS: 1.0000 | ORAL_TABLET | Freq: Four times a day (QID) | ORAL | Status: DC | PRN

## 2013-07-30 NOTE — Progress Notes (Signed)
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   MRN: [id number]  HPI 68yo M with hx of rectal cancer s/p resection and chemo, recently underwent ileostomy take-down and readmitted on 1/30 for enterococcal bacteremia, TEE concerning for mitral valve endocarditis. He was discharged on continuous ampicillin plus low dose gentamicin which he has tolerated thus far. he is on day 23 of 28 on this current regimen. He was last seen by Dr. Johney Maine on 2/18 and doing well with post op recovery and abdominal incision healing well.  From antibiotic standpoint, he has loose stools, no thrush, no rash. He does subscribe to decrease appetite due to food not tasting quite right. No worsening weight loss but no weight gain.   Still has some abd pain "muscle aches" from recent surgery, has had tylenol #3 help but now out of hte medications. He also reports that he has tingling sensation to finger tips and feet which have not improved since gettting chemo  Current Outpatient Prescriptions on File Prior to Visit  Medication Sig Dispense Refill  . acetaminophen (TYLENOL) 325 MG tablet Take 650 mg by mouth every 6 (six) hours as needed for mild pain.      Marland Kitchen ampicillin (OMNIPEN) 2 G injection       . diazepam (VALIUM) 2 MG tablet       . diphenoxylate-atropine (LOMOTIL) 2.5-0.025 MG per tablet       . enoxaparin (LOVENOX) 120 MG/0.8ML injection Inject 0.4 mLs (60 mg total) into the skin daily.  0 Syringe    . ferrous sulfate 325 (65 FE) MG tablet Take 1 tablet (325 mg total) by mouth 2 (two) times daily with a meal.  60 tablet  1  . gentamicin (GARAMYCIN) 1.6-0.9 MG/ML-% Inject 50 mLs (80 mg total) into the vein every 8 (eight) hours.  4200 mL  0  . hydrocortisone (ANUSOL-HC) 2.5 % rectal cream Place 1 application rectally 2 (two) times daily as needed for hemorrhoids.      Marland Kitchen loperamide (IMODIUM) 2 MG capsule       . LORazepam  (ATIVAN) 0.5 MG tablet Take 1-2 tablets (0.5-1 mg total) by mouth at bedtime as needed for anxiety or sleep.  30 tablet  0  . Multiple Vitamin (MULTIVITAMIN WITH MINERALS) TABS Take 1 tablet by mouth every morning.       Marland Kitchen oxyCODONE (OXY IR/ROXICODONE) 5 MG immediate release tablet Take 1-2 tablets (5-10 mg total) by mouth every 4 (four) hours as needed for moderate pain, severe pain or breakthrough pain.  50 tablet  0  . oxymetazoline (AFRIN) 0.05 % nasal spray Place 1 spray into both nostrils 2 (two) times daily as needed for congestion.      . Pediatric Multiple Vit-C-FA (PEDIATRIC MULTIVITAMIN) chewable tablet Chew 2 tablets by mouth daily.      . polyethylene glycol (MIRALAX / GLYCOLAX) packet Take 17 g by mouth daily.      . promethazine (PHENERGAN) 25 MG tablet Take 25 mg by mouth every 6 (six) hours as needed for nausea.       . sodium chloride 0.9 % SOLN 50 mL with ampicillin 2 G SOLR 2 g Inject 2 g into the vein every 4 (four) hours.  112 each  0   No current facility-administered medications on file prior to visit.   Active Ambulatory Problems    Diagnosis Date Noted  .  Rectal cancer - distal s/p lap LAR/coloanal July 2014 - [id number]. 07/27/2012  . Hypertension   . Vertigo   . Mitral regurgitation due to partially flail posterior mitral valve leaflet 07/30/2012  . Hyperlipidemia   . Bilateral inguinal hernia (BIH) 01/03/2013  . Anemia in neoplastic disease 01/14/2013  . Stricture of coloanal anastomosis 04/17/2013  . Bacterial endocarditis 07/06/2013   Resolved Ambulatory Problems    Diagnosis Date Noted  . Fecal urgency due to rectal cancer 07/27/2012  . Umbilical hernia, incarcerated - s/p primary repair July 2014 01/03/2013  . Ileostomy - diverting loop - s/p takedown 06/28/2013 01/14/2013  . Herpes zoster - R flank - with severe pain 03/11/2013   Past Medical History  Diagnosis Date  . Colon cancer 07/18/12 bx  . Rheumatic fever as child  . Hearing loss   . Tinnitus    . Sinusitis   . Arthritis   . Hematochezia   . Heart murmur   . Hypothyroidism as child  . Numbness   . History of shingles   . Blood clot in vein   . GERD (gastroesophageal reflux disease)   . Nocturia   . Radiation        Review of Systems     Objective:   Physical Exam There were no vitals taken for this visit. Physical Exam  Constitutional: He is oriented to person, place, and time. He appears well-developed and well-nourished. No distress.  HENT:  Mouth/Throat: Oropharynx is clear and moist. No oropharyngeal exudate.  Cardiovascular: III/VI soft systolic murmur heard at apex radiating to left/right upper sternal borders and axilla. Exam reveals no gallop and no friction rub.  No murmur heard.  Pulmonary/Chest: Effort normal and breath sounds normal. No respiratory distress. He has no wheezes.  Abdominal: Soft. Bowel sounds are normal. He exhibits no distension. There is no tenderness. Right lower quadrant incision is well healed, no warmth or tenderness or exudate Lymphadenopathy:  He has no cervical adenopathy.  Neurological: He is alert and oriented to person, place, and time. Course intentional tremor to left>right hand Skin: left arm picc line no erythema or tenderness Psychiatric: He has a normal mood and affect. His behavior is normal.   Labs: 2/23 wbc 4.7, hgb 9.5, hct 28.6, cr 1.11, gent trough 0.9 and gent peak 3.6; 2/10 wbc 4.6, hgb 9.2, hct 27.9, cr 1.05     Assessment & Plan:  Enterococcal endocarditis = continue with continous ampicillin infusion plus low dose gentamicin. Based upon labs from 2/23 no need for change in dosing at this time. Will do 4 wk since patient < 3 months of symptoms for IDSA guidelines. Currently on day 23 of 28 days. Last day of antibiotics is March 1st. Will arrange to have picc line pulled on march 2nd by home health.  Therapeutic drug monitoring = kidneys are tolerating gentamicin thus far. Continue with remaining 5 day  course  Abdominal msk pain = likely healing from surgery. Will give refill on tylenol #3 but defer back to dr. Benay Spice for furhter management  Peripheral neuropathy =likely sequelae from chemo. Defer to dr. Benay Spice for further management

## 2013-08-02 ENCOUNTER — Encounter (INDEPENDENT_AMBULATORY_CARE_PROVIDER_SITE_OTHER): Payer: Self-pay

## 2013-08-08 ENCOUNTER — Encounter (INDEPENDENT_AMBULATORY_CARE_PROVIDER_SITE_OTHER): Payer: Self-pay

## 2013-08-14 ENCOUNTER — Encounter (INDEPENDENT_AMBULATORY_CARE_PROVIDER_SITE_OTHER): Payer: Medicare Other | Admitting: Surgery

## 2013-08-16 ENCOUNTER — Encounter (INDEPENDENT_AMBULATORY_CARE_PROVIDER_SITE_OTHER): Payer: Self-pay | Admitting: Surgery

## 2013-08-16 ENCOUNTER — Ambulatory Visit (INDEPENDENT_AMBULATORY_CARE_PROVIDER_SITE_OTHER): Payer: Medicare Other | Admitting: Surgery

## 2013-08-16 VITALS — BP 134/74 | HR 72 | Temp 98.8°F | Resp 14 | Ht 70.0 in | Wt 177.0 lb

## 2013-08-16 DIAGNOSIS — K5900 Constipation, unspecified: Secondary | ICD-10-CM

## 2013-08-16 DIAGNOSIS — D630 Anemia in neoplastic disease: Secondary | ICD-10-CM

## 2013-08-16 DIAGNOSIS — K5909 Other constipation: Secondary | ICD-10-CM | POA: Insufficient documentation

## 2013-08-16 DIAGNOSIS — C20 Malignant neoplasm of rectum: Secondary | ICD-10-CM

## 2013-08-16 DIAGNOSIS — K624 Stenosis of anus and rectum: Secondary | ICD-10-CM

## 2013-08-16 MED ORDER — OXYCODONE HCL 5 MG PO TABS: 5.0000 mg | ORAL_TABLET | Freq: Four times a day (QID) | ORAL | Status: DC | PRN

## 2013-08-16 MED ORDER — HYDROCORTISONE 2.5 % RE CREA
1.0000 | TOPICAL_CREAM | Freq: Two times a day (BID) | RECTAL | Status: DC
Start: 2013-08-16 — End: 2014-04-17

## 2013-08-16 MED ORDER — POLYETHYLENE GLYCOL 3350 17 G PO PACK: 17.0000 g | PACK | Freq: Every day | ORAL | Status: DC

## 2013-08-16 MED ORDER — FERROUS SULFATE 325 (65 FE) MG PO TABS: 325.0000 mg | ORAL_TABLET | Freq: Two times a day (BID) | ORAL | Status: DC

## 2013-08-16 NOTE — Patient Instructions (Signed)
Pelvic floor muscle training exercises  can help strengthen the muscles under the uterus, bladder, and bowel (large intestine). They can help both men and women who have problems with urine leakage or bowel control.  A pelvic floor muscle training exercise is like pretending that you have to urinate, and then holding it. You relax and tighten the muscles that control urine flow. It's important to find the right muscles to tighten.  The next time you have to urinate, start to go and then stop. Feel the muscles in your vagina, bladder, or anus get tight and move up. These are the pelvic floor muscles. If you feel them tighten, you've done the exercise right. If you are still not sure whether you are tightening the right muscles, keep in mind that all of the muscles of the pelvic floor relax and contract at the same time. Because these muscles control the bladder, rectum, and vagina, the following tips may help: Women: Insert a finger into your vagina. Tighten the muscles as if you are holding in your urine, then let go. You should feel the muscles tighten and move up and down.  Men: Insert a finger into your rectum. Tighten the muscles as if you are holding in your urine, then let go. You should feel the muscles tighten and move up and down. These are the same muscles you would tighten if you were trying to prevent yourself from passing gas.  It is very important that you keep the following muscles relaxed while doing pelvic floor muscle training exercises: Abdominal  Buttocks (the deeper, anal sphincter muscle should contract)  Thigh   A woman can also strengthen these muscles by using a vaginal cone, which is a weighted device that is inserted into the vagina. Then you try to tighten the pelvic floor muscles to hold the device in place. If you are unsure whether you are doing the pelvic floor muscle training correctly, you can use biofeedback and electrical stimulation to help find the correct muscle  group to work. Biofeedback is a method of positive reinforcement. Electrodes are placed on the abdomen and along the anal area. Some therapists place a sensor in the vagina in women or anus in men to monitor the contraction of pelvic floor muscles.  A monitor will display a graph showing which muscles are contracting and which are at rest. The therapist can help find the right muscles for performing pelvic floor muscle training exercises.   PERFORMING PELVIC FLOOR EXERCISES: 1. Begin by emptying your bladder. 2. Tighten the pelvic floor muscles and hold for a count of 10. 3. Relax the muscles completely for a count of 10. 4. Do 10 repititions, 3 to 5 times a day (morning, afternoon, and night). You can do these exercises at any time and any place. Most people prefer to do the exercises while lying down or sitting in a chair. After 4 - 6 weeks, most people notice some improvement. It may take as long as 3 months to see a major change. After a couple of weeks, you can also try doing a single pelvic floor contraction at times when you are likely to leak (for example, while getting out of a chair). A word of caution: Some people feel that they can speed up the progress by increasing the number of repetitions and the frequency of exercises. However, over-exercising can instead cause muscle fatigue and increase urine leakage. If you feel any discomfort in your abdomen or back while doing these exercises, you  are probably doing them wrong. Breathe deeply and relax your body when you are doing these exercises. Make sure you are not tightening your stomach, thigh, buttock, or chest muscles. When done the right way, pelvic floor muscle exercises have been shown to be very effective at improving urinary continence. Alternative Names Kegel exercises   GETTING TO [place]. Irregular bowel habits such as constipation and diarrhea can lead to many problems over time.  Having one soft bowel movement a  day is the most important way to prevent further problems.  The anorectal canal is designed to handle stretching and feces to safely manage our ability to get rid of solid waste (feces, poop, stool) out of our body.  BUT, hard constipated stools can act like ripping concrete bricks and diarrhea can be a burning fire to this very sensitive area of our body, causing inflamed hemorrhoids, anal fissures, increasing risk is perirectal abscesses, abdominal pain/bloating, an making irritable bowel worse.     The goal: ONE SOFT BOWEL MOVEMENT A DAY!  To have soft, regular bowel movements:    Drink at least 8 tall glasses of water a day.     Take plenty of fiber.  Fiber is the undigested part of plant food that passes into the colon, acting s "natures broom" to encourage bowel motility and movement.  Fiber can absorb and hold large amounts of water. This results in a larger, bulkier stool, which is soft and easier to pass. Work gradually over several weeks up to 6 servings a day of fiber (25g a day even more if needed) in the form of: o Vegetables -- Root (potatoes, carrots, turnips), leafy green (lettuce, salad greens, celery, spinach), or cooked high residue (cabbage, broccoli, etc) o Fruit -- Fresh (unpeeled skin & pulp), Dried (prunes, apricots, cherries, etc ),  or stewed ( applesauce)  o Whole grain breads, pasta, etc (whole wheat)  o Bran cereals    Bulking Agents -- This type of water-retaining fiber generally is easily obtained each day by one of the following:  o Psyllium bran -- The psyllium plant is remarkable because its ground seeds can retain so much water. This product is available as Metamucil, Konsyl, Effersyllium, Per Diem Fiber, or the less expensive generic preparation in drug and health food stores. Although labeled a laxative, it really is not a laxative.  o Methylcellulose -- This is another fiber derived from wood which also retains water. It is available as Citrucel. o Polyethylene Glycol  - and "artificial" fiber commonly called Miralax or Glycolax.  It is helpful for people with gassy or bloated feelings with regular fiber o Flax Seed - a less gassy fiber than psyllium   No reading or other relaxing activity while on the toilet. If bowel movements take longer than 5 minutes, you are too constipated   AVOID CONSTIPATION.  High fiber and water intake usually takes care of this.  Sometimes a laxative is needed to stimulate more frequent bowel movements, but    Laxatives are not a good long-term solution as it can wear the colon out. o Osmotics (Milk of Magnesia, Fleets phosphosoda, Magnesium citrate, MiraLax, GoLytely) are safer than  o Stimulants (Senokot, Castor Oil, Dulcolax, Ex Lax)    o Do not take laxatives for more than 7days in a row.    IF SEVERELY CONSTIPATED, try a Bowel Retraining Program: o Do not use laxatives.  o Eat a diet high in roughage, such as bran cereals and leafy vegetables.  o Drink six (6) ounces of prune or apricot juice each morning.  o Eat two (2) large servings of stewed fruit each day.  o Take one (1) heaping tablespoon of a psyllium-based bulking agent twice a day. Use sugar-free sweetener when possible to avoid excessive calories.  o Eat a normal breakfast.  o Set aside 15 minutes after breakfast to sit on the toilet, but do not strain to have a bowel movement.  o If you do not have a bowel movement by the third day, use an enema and repeat the above steps.    Controlling diarrhea o Switch to liquids and simpler foods for a few days to avoid stressing your intestines further. o Avoid dairy products (especially milk & ice cream) for a short time.  The intestines often can lose the ability to digest lactose when stressed. o Avoid foods that cause gassiness or bloating.  Typical foods include beans and other legumes, cabbage, broccoli, and dairy foods.  Every person has some sensitivity to other foods, so listen to our body and avoid those foods that  trigger problems for you. o Adding fiber (Citrucel, Metamucil, psyllium, Miralax) gradually can help thicken stools by absorbing excess fluid and retrain the intestines to act more normally.  Slowly increase the dose over a few weeks.  Too much fiber too soon can backfire and cause cramping & bloating. o Probiotics (such as active yogurt, Align, etc) may help repopulate the intestines and colon with normal bacteria and calm down a sensitive digestive tract.  Most studies show it to be of mild help, though, and such products can be costly. o Medicines:   Bismuth subsalicylate (ex. Kayopectate, Pepto Bismol) every 30 minutes for up to 6 doses can help control diarrhea.  Avoid if pregnant.   Loperamide (Immodium) can slow down diarrhea.  Start with two tablets (4mg  total) first and then try one tablet every 6 hours.  Avoid if you are having fevers or severe pain.  If you are not better or start feeling worse, stop all medicines and call your doctor for advice o Call your doctor if you are getting worse or not better.  Sometimes further testing (cultures, endoscopy, X-ray studies, bloodwork, etc) may be needed to help diagnose and treat the cause of the diarrhea.  ANORECTAL SURGERY: POST OP INSTRUCTIONS  1. Take your usually prescribed home medications unless otherwise directed. 2. DIET: Follow a light bland diet the first 24 hours after arrival home, such as soup, liquids, crackers, etc.  Be sure to include lots of fluids daily.  Avoid fast food or heavy meals as your are more likely to get nauseated.  Eat a low fat the next few days after surgery.   3. PAIN CONTROL: a. Pain is best controlled by a usual combination of three different methods TOGETHER: i. Ice/Heat ii. Over the counter pain medication iii. Prescription pain medication b. Most patients will experience some swelling and discomfort in the anus/rectal area. and incisions.  Ice packs or heat (30-60 minutes up to 6 times a day) will help. Use  ice for the first few days to help decrease swelling and bruising, then switch to heat such as warm towels, sitz baths, warm baths, etc to help relax tight/sore spots and speed recovery.  Some people prefer to use ice alone, heat alone, alternating between ice & heat.  Experiment to what works for you.  Swelling and bruising can take several weeks to resolve.   c. It is helpful to  take an over-the-counter pain medication regularly for the first few weeks.  Choose one of the following that works best for you: i. Naproxen (Aleve, etc)  Two 220mg  tabs twice a day ii. Ibuprofen (Advil, etc) Three 200mg  tabs four times a day (every meal & bedtime) iii. Acetaminophen (Tylenol, etc) 500-650mg  four times a day (every meal & bedtime) d. A  prescription for pain medication (such as oxycodone, hydrocodone, etc) should be given to you upon discharge.  Take your pain medication as prescribed.  i. If you are having problems/concerns with the prescription medicine (does not control pain, nausea, vomiting, rash, itching, etc), please call us [phone] to see if we need to switch you to a different pain medicine that will work better for you and/or control your side effect better. ii. If you need a refill on your pain medication, please contact your pharmacy.  They will contact our office to request authorization. Prescriptions will not be filled after 5 pm or on week-ends.  Use a Sitz Bath 4-8 times a day for relief A sitz bath is a warm water bath taken in the sitting position that covers only the hips and buttocks. It may be used for either healing or hygiene purposes. Sitz baths are also used to relieve pain, itching, or muscle spasms. The water may contain medicine. Moist heat will help you heal and relax.  HOME CARE INSTRUCTIONS  Take 3 to 4 sitz baths a day. 1. Fill the bathtub half full with warm water. 2. Sit in the water and open the drain a little. 3. Turn on the warm water to keep the tub half full.  Keep the water running constantly. 4. Soak in the water for 15 to 20 minutes. 5. After the sitz bath, pat the affected area dry first. SEEK MEDICAL CARE IF:  You get worse instead of better. Stop the sitz baths if you get worse.   4. KEEP YOUR BOWELS REGULAR a. The goal is one bowel movement a day b. Avoid getting constipated.  Between the surgery and the pain medications, it is common to experience some constipation.  Increasing fluid intake and taking a fiber supplement (such as Metamucil, Citrucel, FiberCon, MiraLax, etc) 1-2 times a day regularly will usually help prevent this problem from occurring.  A mild laxative (prune juice, Milk of Magnesia, MiraLax, etc) should be taken according to package directions if there are no bowel movements after 48 hours. c. Watch out for diarrhea.  If you have many loose bowel movements, simplify your diet to bland foods & liquids for a few days.  Stop any stool softeners and decrease your fiber supplement.  Switching to mild anti-diarrheal medications (Kayopectate, Pepto Bismol) can help.  If this worsens or does not improve, please call us.  5. Wound Care a. Remove your bandages the day after surgery.  Unless discharge instructions indicate otherwise, leave your bandage dry and in place overnight.  Remove the bandage during your first bowel movement.   b. Allow the wound packing to fall out over the next few days.  You can trim exposed gauze / ribbon as it falls out.  You do not need to repack the wound unless instructed otherwise.  Wear an absorbent pad or soft cotton gauze in your underwear as needed to catch any drainage and help keep the area  c. Keep the area clean and dry.  Bathe / shower every day.  Keep the area clean by showering / bathing over the incision /  wound.   It is okay to soak an open wound to help wash it.  Wet wipes or showers / gentle washing after bowel movements is often less traumatic than regular toilet paper. d. Dennis Bast may have some  styrofoam-like soft packing in the rectum which will come out with the first bowel movement.  e. You will often notice bleeding with bowel movements.  This should slow down by the end of the first week of surgery f. Expect some drainage.  This should slow down, too, by the end of the first week of surgery.  Wear an absorbent pad or soft cotton gauze in your underwear until the drainage stops. 6. ACTIVITIES as tolerated:   a. You may resume regular (light) daily activities beginning the next day-such as daily self-care, walking, climbing stairs-gradually increasing activities as tolerated.  If you can walk 30 minutes without difficulty, it is safe to try more intense activity such as jogging, treadmill, bicycling, low-impact aerobics, swimming, etc. b. Save the most intensive and strenuous activity for last such as sit-ups, heavy lifting, contact sports, etc  Refrain from any heavy lifting or straining until you are off narcotics for pain control.   c. DO NOT PUSH THROUGH PAIN.  Let pain be your guide: If it hurts to do something, don't do it.  Pain is your body warning you to avoid that activity for another week until the pain goes down. d. You may drive when you are no longer taking prescription pain medication, you can comfortably sit for long periods of time, and you can safely maneuver your car and apply brakes. e. Dennis Bast may have sexual intercourse when it is comfortable.  7. FOLLOW UP in our office a. Please call [facility] at [phone] to set up an appointment to see your surgeon in the office for a follow-up appointment approximately 2 weeks after your surgery. b. Make sure that you call for this appointment the day you arrive home to insure a convenient appointment time. 10. IF YOU HAVE DISABILITY OR FAMILY LEAVE FORMS, BRING THEM TO THE OFFICE FOR PROCESSING.  DO NOT GIVE THEM TO YOUR DOCTOR.        WHEN TO CALL us [phone]: 1. Poor pain control 2. Reactions / problems with new  medications (rash/itching, nausea, etc)  3. Fever over 101.5 F (38.5 C) 4. Inability to urinate 5. Nausea and/or vomiting 6. Worsening swelling or bruising 7. Continued bleeding from incision. 8. Increased pain, redness, or drainage from the incision  The clinic staff is available to answer your questions during regular business hours (8:30am-5pm).  Please don't hesitate to call and ask to speak to one of our nurses for clinical concerns.   A surgeon from [facility] is always on call at the hospitals   If you have a medical emergency, go to the nearest emergency room or call 911.    [facility], [place], [place], [place], [place]  [address] ? MAIN: [phone] ? TOLL FREE: [phone] ? FAX [phone] [email or web address]

## 2013-08-16 NOTE — Progress Notes (Signed)
Subjective:     Patient ID: Tyler Diaz, male   DOB: 09-14-1945, 68 y.o.   MRN: [id number]  HPI   Note: This dictation was prepared with Dragon/digital dictation along with [employer]. Any transcriptional errors that result from this process are unintentional.       Tyler Diaz  1945-08-06 [id number]  Patient Care Team: Chipper Herb, MD as PCP - General (Family Medicine) Adin Hector, MD as Consulting Physician (Colon and Rectal Surgery) Lear Ng, MD as Consulting Physician (Gastroenterology) Gatha Mayer, MD as Consulting Physician (Radiation Oncology) Carola Frost, RN as Registered Nurse (Oncology) [name], MD as Consulting Physician (Oncology) Carlyle Basques, MD as Consulting Physician (Infectious Diseases)  Procedure (Date: 06/28/2013):  POST-OPERATIVE DIAGNOSIS:  Rectal cancer Status post low anterior resection, coloanal anastomosis, diverting loop ileostomy.  Stricture of coloanal anastomosis.   PROCEDURE: Procedure(s):  loop ILEOSTOMY TAKEDOWN  Dilation of coloanal anastomosis  Examination of anesthesia   SURGEON: Surgeon(s):  Adin Hector, MD  Edward Jolly, MD - Asst   This patient returns for surgical re-evaluation.  He is gradually recovering.  He was readmitted for some concerns of dehydration and fever.  CT scan showed no abscess.  Small seroma.  No wound infection.  Positive blood cultures.  Echocardiogram suspicious for endocarditis.  Started on IV antibiotics.  Infectious disease consultation made.  Recommendation made for 4 weeks of IV antibiotics - finished last week.  He has no PICC line.  He is eating great.  Some fecal urgency but no incontinence.  Occasionally mild loose or narrow stools.  He comes today with his son.  Walking okay.  No fevers or chills now.  No shingles or skin problems.  No bleeding from his ostomy site.  Patient Active Problem List   Diagnosis Date Noted  . Constipation, chronic  08/16/2013  . Bacterial endocarditis 07/06/2013  . Stricture of coloanal anastomosis 04/17/2013  . Anemia in neoplastic disease 01/14/2013  . Bilateral inguinal hernia (BIH) 01/03/2013  . Mitral regurgitation due to partially flail posterior mitral valve leaflet 07/30/2012  . Hyperlipidemia   . Rectal cancer - distal s/p lap LAR/coloanal July 2014 - [id number]. 07/27/2012  . Hypertension   . Vertigo     Past Medical History  Diagnosis Date  . Mitral regurgitation due to partially flail posterior mitral valve leaflet   . Rectal cancer   . Hyperlipidemia   . Colon cancer 07/18/12 bx    rectum=Invasive adenocarcinoma w/ extracellular mucin,polyp=benign  . Rheumatic fever as child    hx  . Hearing loss     partial greater on left  . Tinnitus     left ear  . Sinusitis     seasonal  . Arthritis     hands/knees  . Hematochezia     recent  . Heart murmur   . Hypothyroidism as child    hx of  . Numbness     TOES / FINGERS DUE TO CHEMO  . History of shingles   . Blood clot in vein     RT ARM WITH PICC LINE   . GERD (gastroesophageal reflux disease)     OCCASIONAL  . Nocturia   . Radiation     FOR COLON CANCER  . Umbilical hernia, incarcerated - s/p primary repair July 2014 01/03/2013  . Herpes zoster - R flank - with severe pain 03/11/2013  . Ileostomy - diverting loop - s/p takedown 06/28/2013 01/14/2013    Past  Surgical History  Procedure Laterality Date  . Carpal tunnel release Right 2002  . Tonsillectomy      as child  . Laparoscopic low anterior resection N/A 12/27/2012    Procedure: LAPAROSCOPIC LOW ANTERIOR RESECTION, COLO-ANAL ANASTOMOSIS, DIVERTING LOOP ILEOSTOMY,SPLENIC FLEXURE MOBILIZATION, PRIMARY INCARCERATED UMBILICAL HERNIA REPAIR;  Surgeon: Adin Hector, MD;  Location: [facility];  Service: General;  Laterality: N/A;  . Laparoscopic low anterior rescection with coloanal anastomosis N/A 12/27/2012    Procedure: LAPAROSCOPIC LOW ANTERIOR RESCECTION WITH COLOANAL  ANASTOMOSIS;  Surgeon: Adin Hector, MD;  Location: [facility];  Service: General;  Laterality: N/A;  . Ileostomy N/A 12/27/2012    Procedure: DIVERTING LOOP ILEOSTOMY;  Surgeon: Adin Hector, MD;  Location: [facility];  Service: General;  Laterality: N/A;  . Umbilical hernia repair  12/27/2012    Procedure: PRIMARY REPAIR INCARCERATED UMBILICAL HERNIA;  Surgeon: Adin Hector, MD;  Location: [facility];  Service: General;;  . Hernia repair    . Ileostomy closure N/A 06/28/2013    Procedure: loop ILEOSTOMY TAKEDOWN examination of anesthesia ;  Surgeon: Adin Hector, MD;  Location: [facility];  Service: General;  Laterality: N/A;  . Tee without cardioversion N/A 07/10/2013    Procedure: TRANSESOPHAGEAL ECHOCARDIOGRAM (TEE);  Surgeon: Candee Furbish, MD;  Location: [facility];  Service: Cardiovascular;  Laterality: N/A;    History   Social History  . Marital Status: Married    Spouse Name: N/A    Number of Children: 2  . Years of Education: N/A   Occupational History  . Retired     Chiropractor   Social History Main Topics  . Smoking status: Former Smoker -- 1.50 packs/day for 20 years    Types: Cigarettes    Quit date: 07/27/1972  . Smokeless tobacco: Never Used  . Alcohol Use: No  . Drug Use: No     Comment: quit smoking 35 years ago  . Sexual Activity: Not on file   Other Topics Concern  . 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  Married    Family History  Problem Relation Age of Onset  . Cancer Mother     abdominal ca ?ovarian?  . Cancer Sister     liver cancer    Current Outpatient Prescriptions  Medication Sig Dispense Refill  . acetaminophen-codeine (TYLENOL #3) 300-30 MG per tablet Take 1 tablet by mouth every 6 (six) hours as needed for moderate pain.  60 tablet  0  . diazepam (VALIUM) 2 MG tablet       . diphenoxylate-atropine (LOMOTIL) 2.5-0.025 MG per tablet       . enoxaparin (LOVENOX) 120 MG/0.8ML  injection Inject 0.4 mLs (60 mg total) into the skin daily.  0 Syringe    . ferrous sulfate 325 (65 FE) MG tablet Take 1 tablet (325 mg total) by mouth 2 (two) times daily with a meal.  60 tablet  1  . hydrocortisone (PROCTOSOL HC) 2.5 % rectal cream Place 1 application rectally 2 (two) times daily.  30 g  0  . loperamide (IMODIUM) 2 MG capsule       . LORazepam (ATIVAN) 0.5 MG tablet Take 1-2 tablets (0.5-1 mg total) by mouth at bedtime as needed for anxiety or sleep.  30 tablet  0  . Multiple Vitamin (MULTIVITAMIN WITH MINERALS) TABS Take 1 tablet by mouth every morning.       Marland Kitchen oxyCODONE (OXY  IR/ROXICODONE) 5 MG immediate release tablet Take 1 tablet (5 mg total) by mouth every 6 (six) hours as needed for moderate pain, severe pain or breakthrough pain.  40 tablet  0  . oxymetazoline (AFRIN) 0.05 % nasal spray Place 1 spray into both nostrils 2 (two) times daily as needed for congestion.      . Pediatric Multiple Vit-C-FA (PEDIATRIC MULTIVITAMIN) chewable tablet Chew 2 tablets by mouth daily.      . polyethylene glycol (MIRALAX / GLYCOLAX) packet Take 17 g by mouth daily.  30 each  5  . promethazine (PHENERGAN) 25 MG tablet Take 25 mg by mouth every 6 (six) hours as needed for nausea.       . sodium chloride 0.9 % SOLN 50 mL with ampicillin 2 G SOLR 2 g Inject 2 g into the vein every 4 (four) hours.  112 each  0   No current facility-administered medications for this visit.     Allergies  Allergen Reactions  . Shrimp [Shellfish Allergy] Hives, Nausea And Vomiting and Rash    BP 134/74  Pulse 72  Temp(Src) 98.8 F (37.1 C) (Oral)  Resp 14  Ht 5\' 10"  (1.778 m)  Wt 177 lb (80.287 kg)  BMI 25.40 kg/m2  Ct Abdomen Pelvis W Contrast  07/07/2013   CLINICAL DATA:  History of rectal carcinoma. Status post recent ileostomy take-down. Positive blood cultures. Normal white blood cell count. Patient was chills and fever. Patient has positive blood cultures.  EXAM: CT ABDOMEN AND PELVIS WITH  CONTRAST  TECHNIQUE: Multidetector CT imaging of the abdomen and pelvis was performed using the standard protocol following bolus administration of intravenous contrast.  CONTRAST:  60mL OMNIPAQUE IOHEXOL 300 MG/ML SOLN, 143mL OMNIPAQUE IOHEXOL 300 MG/ML SOLN  COMPARISON:  08/02/2012  FINDINGS: There is irregular fluid attenuation associated with hazy and reticular inflammatory change in the subcutaneous soft tissues of the right lower quadrant. The irregular fluid measures approximately 3.6 cm x 2.2 cm x 0.5 cm. This may reflect a small area of sterile postoperative fluid with associated edema. An early abscess is possible.  There is presacral edema and mild stranding in the fat of the pelvis that is all felt to be post surgical in origin. There is no intra-abdominal collection/abscess.  The appendix is dilated to 9 mm. There is no significant associated inflammatory change. This could potentially reflect an early acute appendicitis, but is not conclusive.  The ileal anastomosis in the right lower quadrant is unremarkable. No bowel wall thickening or inflammatory change. No bowel dilation to suggest obstruction or significant ileus.  Minimal lung base subsegmental atelectasis. Lung bases are otherwise clear.  Liver and spleen are unremarkable.  Multiple gallstones. No evidence of acute cholecystitis. No bile duct dilation. Normal pancreas. No adrenal masses.  Small renal low-density lesions, likely cysts. Kidneys are otherwise unremarkable. Normal ureters. Normal bladder. Prostate is enlarged but stable.  No pathologically enlarged lymph nodes.  No ascites.  IMPRESSION: 1. There are 2 potential sources for positive blood cultures. 2. There is a small irregular fluid collection within the subcutaneous fat of the right lower quadrant that could reflect a poorly defined abscess with associated cellulitis. It may simply reflect sterile postoperative fluid and edema. 3. The appendix is dilated 9 mm. This could reflect  an early acute appendicitis but is not conclusive. 4. There is no intra-abdominal or pelvic abscess. Mild pelvic fat stranding and presacral edema is consistent with the expected postsurgical change. 5. No evidence of bowel  inflammation or obstruction. No adynamic ileus. 6. Other chronic findings as described are stable from the prior CT.   Electronically Signed   By: Lajean Manes M.D.   On: 07/07/2013 09:00   Dg Abd Acute W/chest  07/05/2013   CLINICAL DATA:  Fever after recent ileostomy take-down  EXAM: ACUTE ABDOMEN SERIES (ABDOMEN 2 VIEW & CHEST 1 VIEW)  COMPARISON:  08/02/2012 chest CT  FINDINGS: Normal heart size and mediastinal contours. No acute infiltrate or edema. No effusion or pneumothorax. There is minimal atelectasis or scar at the peripheral left base. Previously noted pulmonary nodules are not visible. No acute osseous findings.  No gas dilated bowel. No abnormal fluid levels. Stool is present within the proximal colon, reassuring finding after ileostomy takedown. Negative for pneumoperitoneum.  IMPRESSION: Negative abdominal radiographs.  No evidence of pneumonia.   Electronically Signed   By: Jorje Guild M.D.   On: 07/05/2013 23:28    Review of Systems  Constitutional: Negative for fever, chills and diaphoresis.  HENT: Negative for sore throat and trouble swallowing.   Eyes: Negative for photophobia and visual disturbance.  Respiratory: Negative for choking and shortness of breath.   Cardiovascular: Negative for chest pain and palpitations.  Gastrointestinal: Negative for nausea, vomiting, abdominal distention, anal bleeding and rectal pain.  Genitourinary: Negative for dysuria, urgency, difficulty urinating and testicular pain.  Musculoskeletal: Negative for arthralgias, gait problem, myalgias and neck pain.  Skin: Negative for color change and rash.  Neurological: Negative for dizziness, speech difficulty, weakness and numbness.  Hematological: Negative for adenopathy.    Psychiatric/Behavioral: Negative for hallucinations, confusion and agitation.       Objective:   Physical Exam  Constitutional: He is oriented to person, place, and time. He appears well-developed and well-nourished. No distress.  HENT:  Head: Normocephalic.  Mouth/Throat: Oropharynx is clear and moist. No oropharyngeal exudate.  Eyes: Conjunctivae and EOM are normal. Pupils are equal, round, and reactive to light. No scleral icterus.  Neck: Normal range of motion. No tracheal deviation present.  Cardiovascular: Normal rate, normal heart sounds and intact distal pulses.   Pulmonary/Chest: Effort normal. No respiratory distress.  Abdominal: Soft. He exhibits no distension. There is no tenderness. Hernia confirmed negative in the right inguinal area and confirmed negative in the left inguinal area.    Incisions clean with normal healing ridges.  No hernias  Genitourinary:  Exam done with assistance of male Medical Assistant in the room.  Perianal skin clean with good hygiene.  No pruritis.  No pilonidal disease.  No fissure.  No abscess/fistula.    External hemorrhoids minimal.  Normal sphincter tone.  Tolerates digital rectal exam.  Stricture rectal and anal anastomosis 2.5 cm from anal verge.  Gently dilated to 2 cm diameter.  Some discomfort but tolerated well.  Musculoskeletal: Normal range of motion. He exhibits no tenderness.       Arms: Neurological: He is alert and oriented to person, place, and time. No cranial nerve deficit. He exhibits normal muscle tone. Coordination normal.  Skin: Skin is warm and dry. No rash noted. He is not diaphoretic.  Psychiatric: He has a normal mood and affect. His behavior is normal.       Assessment:     Gradually recovering from ileostomy takedown complicated by endocarditis with bacteremia   Stricture a coloanal anastomosis but no obstruction the    Plan:     Increase activity as tolerated to regular activity.  Low impact exercise such  as walking an  hour a day at least ideal.  Do not push through pain.  Diet as tolerated.  Low fat high fiber diet ideal.  Bowel regimen with 30 g fiber a day and fiber supplement as needed to avoid problems.  Continue Kegel pelvic floor exercises to help strengthen sphincter.    I again discussed with the patient and his son to reconsider pelvic floor physical therapy To help with encopresis and dilating the coloanal stricture.Marland Kitchen  He feels that he is improving.  He wants to wait one more month to see if he will improve more, so we will hold off for now.  Keep it simple.  Return to clinic in 1 month to make sure that he is improving, sooner as needed.   Instructions discussed.  Followup with primary care physician for other health issues as would normally be done.  Consider screening for malignancies (breast, prostate, colon, melanoma, etc) as appropriate.  Questions answered.  The patient & son expressed understanding and appreciation

## 2013-08-17 ENCOUNTER — Emergency Department (HOSPITAL_COMMUNITY): Payer: Medicare Other

## 2013-08-17 ENCOUNTER — Inpatient Hospital Stay (HOSPITAL_COMMUNITY)
Admission: EM | Admit: 2013-08-17 | Discharge: 2013-08-22 | DRG: 871 | Disposition: A | Discharge status: Home / Self Care | Payer: Medicare Other | Attending: Internal Medicine | Admitting: Internal Medicine

## 2013-08-17 ENCOUNTER — Telehealth (INDEPENDENT_AMBULATORY_CARE_PROVIDER_SITE_OTHER): Payer: Self-pay | Admitting: Surgery

## 2013-08-17 ENCOUNTER — Encounter (HOSPITAL_COMMUNITY): Payer: Self-pay | Admitting: Emergency Medicine

## 2013-08-17 DIAGNOSIS — D630 Anemia in neoplastic disease: Secondary | ICD-10-CM

## 2013-08-17 DIAGNOSIS — K219 Gastro-esophageal reflux disease without esophagitis: Secondary | ICD-10-CM | POA: Diagnosis present

## 2013-08-17 DIAGNOSIS — Z79899 Other long term (current) drug therapy: Secondary | ICD-10-CM

## 2013-08-17 DIAGNOSIS — Z87891 Personal history of nicotine dependence: Secondary | ICD-10-CM

## 2013-08-17 DIAGNOSIS — I330 Acute and subacute infective endocarditis: Secondary | ICD-10-CM

## 2013-08-17 DIAGNOSIS — E860 Dehydration: Secondary | ICD-10-CM

## 2013-08-17 DIAGNOSIS — Z85048 Personal history of other malignant neoplasm of rectum, rectosigmoid junction, and anus: Secondary | ICD-10-CM

## 2013-08-17 DIAGNOSIS — Z800 Family history of malignant neoplasm of digestive organs: Secondary | ICD-10-CM

## 2013-08-17 DIAGNOSIS — E039 Hypothyroidism, unspecified: Secondary | ICD-10-CM | POA: Diagnosis present

## 2013-08-17 DIAGNOSIS — T451X5A Adverse effect of antineoplastic and immunosuppressive drugs, initial encounter: Secondary | ICD-10-CM | POA: Diagnosis present

## 2013-08-17 DIAGNOSIS — Z8679 Personal history of other diseases of the circulatory system: Secondary | ICD-10-CM | POA: Diagnosis present

## 2013-08-17 DIAGNOSIS — A419 Sepsis, unspecified organism: Secondary | ICD-10-CM | POA: Diagnosis present

## 2013-08-17 DIAGNOSIS — I10 Essential (primary) hypertension: Secondary | ICD-10-CM

## 2013-08-17 DIAGNOSIS — K5909 Other constipation: Secondary | ICD-10-CM

## 2013-08-17 DIAGNOSIS — E785 Hyperlipidemia, unspecified: Secondary | ICD-10-CM

## 2013-08-17 DIAGNOSIS — A0472 Enterocolitis due to Clostridium difficile, not specified as recurrent: Secondary | ICD-10-CM

## 2013-08-17 DIAGNOSIS — Z85038 Personal history of other malignant neoplasm of large intestine: Secondary | ICD-10-CM

## 2013-08-17 DIAGNOSIS — I340 Nonrheumatic mitral (valve) insufficiency: Secondary | ICD-10-CM | POA: Diagnosis present

## 2013-08-17 DIAGNOSIS — H9190 Unspecified hearing loss, unspecified ear: Secondary | ICD-10-CM | POA: Diagnosis present

## 2013-08-17 DIAGNOSIS — K4020 Bilateral inguinal hernia, without obstruction or gangrene, not specified as recurrent: Secondary | ICD-10-CM

## 2013-08-17 DIAGNOSIS — I059 Rheumatic mitral valve disease, unspecified: Secondary | ICD-10-CM

## 2013-08-17 DIAGNOSIS — Z8619 Personal history of other infectious and parasitic diseases: Secondary | ICD-10-CM

## 2013-08-17 DIAGNOSIS — D649 Anemia, unspecified: Secondary | ICD-10-CM | POA: Diagnosis present

## 2013-08-17 DIAGNOSIS — R209 Unspecified disturbances of skin sensation: Secondary | ICD-10-CM | POA: Diagnosis present

## 2013-08-17 DIAGNOSIS — K624 Stenosis of anus and rectum: Secondary | ICD-10-CM

## 2013-08-17 DIAGNOSIS — R42 Dizziness and giddiness: Secondary | ICD-10-CM

## 2013-08-17 DIAGNOSIS — A414 Sepsis due to anaerobes: Principal | ICD-10-CM | POA: Diagnosis present

## 2013-08-17 DIAGNOSIS — H9319 Tinnitus, unspecified ear: Secondary | ICD-10-CM | POA: Diagnosis present

## 2013-08-17 DIAGNOSIS — C20 Malignant neoplasm of rectum: Secondary | ICD-10-CM

## 2013-08-17 DIAGNOSIS — Z91013 Allergy to seafood: Secondary | ICD-10-CM

## 2013-08-17 DIAGNOSIS — M129 Arthropathy, unspecified: Secondary | ICD-10-CM | POA: Diagnosis present

## 2013-08-17 LAB — CBC WITH DIFFERENTIAL/PLATELET
Basophils Absolute: 0 10*3/uL (ref 0.0–0.1)
Basophils Relative: 0 % (ref 0–1)
EOS PCT: 0 % (ref 0–5)
Eosinophils Absolute: 0 10*3/uL (ref 0.0–0.7)
HEMATOCRIT: 36 % — AB (ref 39.0–52.0)
HEMOGLOBIN: 11.9 g/dL — AB (ref 13.0–17.0)
LYMPHS PCT: 5 % — AB (ref 12–46)
Lymphs Abs: 1.3 10*3/uL (ref 0.7–4.0)
MCH: 33.1 pg (ref 26.0–34.0)
MCHC: 33.1 g/dL (ref 30.0–36.0)
MCV: 100.3 fL — AB (ref 78.0–100.0)
MONO ABS: 3.3 10*3/uL — AB (ref 0.1–1.0)
Monocytes Relative: 12 % (ref 3–12)
Neutro Abs: 22.4 10*3/uL — ABNORMAL HIGH (ref 1.7–7.7)
Neutrophils Relative %: 83 % — ABNORMAL HIGH (ref 43–77)
Platelets: 173 10*3/uL (ref 150–400)
RBC: 3.59 MIL/uL — ABNORMAL LOW (ref 4.22–5.81)
RDW: 13.7 % (ref 11.5–15.5)
WBC: 27.1 10*3/uL — ABNORMAL HIGH (ref 4.0–10.5)

## 2013-08-17 LAB — I-STAT CG4 LACTIC ACID, ED: Lactic Acid, Venous: 3.27 mmol/L — ABNORMAL HIGH (ref 0.5–2.2)

## 2013-08-17 LAB — COMPREHENSIVE METABOLIC PANEL
ALBUMIN: 3.6 g/dL (ref 3.5–5.2)
ALK PHOS: 83 U/L (ref 39–117)
ALT: 30 U/L (ref 0–53)
AST: 30 U/L (ref 0–37)
BUN: 21 mg/dL (ref 6–23)
CALCIUM: 9.6 mg/dL (ref 8.4–10.5)
CO2: 25 mEq/L (ref 19–32)
Chloride: 95 mEq/L — ABNORMAL LOW (ref 96–112)
Creatinine, Ser: 1.26 mg/dL (ref 0.50–1.35)
GFR calc non Af Amer: 57 mL/min — ABNORMAL LOW (ref >90)
GFR, EST AFRICAN AMERICAN: 65 mL/min — AB (ref >90)
GLUCOSE: 100 mg/dL — AB (ref 70–99)
POTASSIUM: 4.6 meq/L (ref 3.7–5.3)
Sodium: 135 mEq/L — ABNORMAL LOW (ref 137–147)
Total Bilirubin: 0.9 mg/dL (ref 0.3–1.2)
Total Protein: 7.1 g/dL (ref 6.0–8.3)

## 2013-08-17 LAB — URINALYSIS, ROUTINE W REFLEX MICROSCOPIC
BILIRUBIN URINE: NEGATIVE
Glucose, UA: NEGATIVE mg/dL
Hgb urine dipstick: NEGATIVE
Ketones, ur: NEGATIVE mg/dL
Leukocytes, UA: NEGATIVE
NITRITE: NEGATIVE
Protein, ur: NEGATIVE mg/dL
Specific Gravity, Urine: 1.016 (ref 1.005–1.030)
UROBILINOGEN UA: 0.2 mg/dL (ref 0.0–1.0)
pH: 6.5 (ref 5.0–8.0)

## 2013-08-17 LAB — MRSA PCR SCREENING: MRSA by PCR: NEGATIVE

## 2013-08-17 MED ORDER — HYDROCORTISONE 2.5 % RE CREA
1.0000 "application " | TOPICAL_CREAM | Freq: Two times a day (BID) | RECTAL | Status: DC
  Administered 2013-08-17 – 2013-08-20 (×5): 1 via RECTAL
  Filled 2013-08-17: qty 28.35

## 2013-08-17 MED ORDER — VANCOMYCIN HCL IN DEXTROSE 750-5 MG/150ML-% IV SOLN
750.0000 mg | Freq: Two times a day (BID) | INTRAVENOUS | Status: DC
  Administered 2013-08-18 (×2): 750 mg via INTRAVENOUS
  Filled 2013-08-17 (×3): qty 150

## 2013-08-17 MED ORDER — ACETAMINOPHEN 325 MG PO TABS
650.0000 mg | ORAL_TABLET | Freq: Four times a day (QID) | ORAL | Status: DC | PRN
  Administered 2013-08-18 (×2): 650 mg via ORAL
  Filled 2013-08-17 (×2): qty 2

## 2013-08-17 MED ORDER — ADULT MULTIVITAMIN W/MINERALS CH
1.0000 | ORAL_TABLET | Freq: Every morning | ORAL | Status: DC
  Administered 2013-08-17 – 2013-08-22 (×5): 1 via ORAL
  Filled 2013-08-17 (×6): qty 1

## 2013-08-17 MED ORDER — OXYCODONE HCL 5 MG PO TABS
5.0000 mg | ORAL_TABLET | Freq: Four times a day (QID) | ORAL | Status: DC | PRN
  Administered 2013-08-18 – 2013-08-19 (×2): 5 mg via ORAL
  Filled 2013-08-17 (×7): qty 1

## 2013-08-17 MED ORDER — MORPHINE SULFATE 2 MG/ML IJ SOLN
2.0000 mg | INTRAMUSCULAR | Status: DC | PRN
  Administered 2013-08-18 – 2013-08-19 (×4): 2 mg via INTRAVENOUS
  Filled 2013-08-17 (×5): qty 1

## 2013-08-17 MED ORDER — FERROUS SULFATE 325 (65 FE) MG PO TABS
325.0000 mg | ORAL_TABLET | Freq: Two times a day (BID) | ORAL | Status: DC
  Administered 2013-08-17 – 2013-08-22 (×9): 325 mg via ORAL
  Filled 2013-08-17 (×13): qty 1

## 2013-08-17 MED ORDER — POLYETHYLENE GLYCOL 3350 17 G PO PACK
17.0000 g | PACK | Freq: Every day | ORAL | Status: DC
  Administered 2013-08-17 – 2013-08-22 (×5): 17 g via ORAL
  Filled 2013-08-17 (×6): qty 1

## 2013-08-17 MED ORDER — SODIUM CHLORIDE 0.9 % IV SOLN
INTRAVENOUS | Status: AC
  Administered 2013-08-17 – 2013-08-18 (×2): via INTRAVENOUS

## 2013-08-17 MED ORDER — ACETAMINOPHEN-CODEINE #3 300-30 MG PO TABS: 1.0000 | ORAL_TABLET | Freq: Four times a day (QID) | ORAL | Status: DC | PRN

## 2013-08-17 MED ORDER — PIPERACILLIN-TAZOBACTAM 3.375 G IVPB
3.3750 g | Freq: Three times a day (TID) | INTRAVENOUS | Status: DC
  Administered 2013-08-18 (×3): 3.375 g via INTRAVENOUS
  Filled 2013-08-17 (×5): qty 50

## 2013-08-17 MED ORDER — SODIUM CHLORIDE 0.9 % IV SOLN
INTRAVENOUS | Status: DC
Start: 2013-08-17 — End: 2013-08-17

## 2013-08-17 MED ORDER — VANCOMYCIN HCL IN DEXTROSE 1-5 GM/200ML-% IV SOLN
1000.0000 mg | Freq: Once | INTRAVENOUS | Status: AC
  Administered 2013-08-17: 1000 mg via INTRAVENOUS
  Filled 2013-08-17: qty 200

## 2013-08-17 MED ORDER — LORAZEPAM 0.5 MG PO TABS
0.5000 mg | ORAL_TABLET | Freq: Every evening | ORAL | Status: DC | PRN
  Administered 2013-08-18 – 2013-08-21 (×3): 0.5 mg via ORAL
  Filled 2013-08-17 (×3): qty 1

## 2013-08-17 MED ORDER — DIPHENOXYLATE-ATROPINE 2.5-0.025 MG PO TABS: 1.0000 | ORAL_TABLET | Freq: Four times a day (QID) | ORAL | Status: DC | PRN

## 2013-08-17 MED ORDER — ACETAMINOPHEN 650 MG RE SUPP: 650.0000 mg | Freq: Four times a day (QID) | RECTAL | Status: DC | PRN

## 2013-08-17 MED ORDER — ACETAMINOPHEN 325 MG PO TABS
650.0000 mg | ORAL_TABLET | Freq: Four times a day (QID) | ORAL | Status: DC | PRN
  Administered 2013-08-17: 650 mg via ORAL
  Filled 2013-08-17: qty 2

## 2013-08-17 MED ORDER — PIPERACILLIN-TAZOBACTAM 3.375 G IVPB 30 MIN
3.3750 g | Freq: Once | INTRAVENOUS | Status: AC
  Administered 2013-08-17: 3.375 g via INTRAVENOUS
  Filled 2013-08-17: qty 50

## 2013-08-17 MED ORDER — SODIUM CHLORIDE 0.9 % IV BOLUS (SEPSIS)
1000.0000 mL | Freq: Once | INTRAVENOUS | Status: AC
  Administered 2013-08-17: 1000 mL via INTRAVENOUS

## 2013-08-17 MED ORDER — DIAZEPAM 2 MG PO TABS: 2.0000 mg | ORAL_TABLET | Freq: Every evening | ORAL | Status: DC | PRN

## 2013-08-17 NOTE — Telephone Encounter (Signed)
Telephone call from patient's wife.  Saw Dr. Johney Maine yesterday doing well.  This morning with fever to 103.0 and "not acting right".  Gave tylenol and temp now down to 99.9.  Patient sleeping.  Advised to continue to monitor.  If patient's temp greater than 101.0 or mental status changes, they are to take patient to ER for evaluation and lab work.  Earnstine Regal, MD, [facility] Office: [phone]

## 2013-08-17 NOTE — Progress Notes (Signed)
ANTIBIOTIC CONSULT NOTE - INITIAL  Pharmacy Consult for Vancomycin & Zosyn Indication: Sepsis  Allergies  Allergen Reactions  . Shrimp [Shellfish Allergy] Hives, Nausea And Vomiting and Rash    Patient Measurements: Height: 5\' 10"  (177.8 cm) Weight: 178 lb 2.1 oz (80.8 kg) IBW/kg (Calculated) : 73  Vital Signs: Temp: 99.7 F (37.6 C) (03/14 2021) Temp src: Oral (03/14 2021) BP: 100/50 mmHg (03/14 1946) Pulse Rate: 116 (03/14 1946) Intake/Output from previous day:   Intake/Output from this shift:    Labs:  Recent Labs  08/17/13 1700  WBC 27.1*  HGB 11.9*  PLT 173  CREATININE 1.26   Estimated Creatinine Clearance: 57.1 ml/min (by C-G formula based on Cr of 1.26). No results found for this basename: VANCOTROUGH, VANCOPEAK, VANCORANDOM, GENTTROUGH, GENTPEAK, GENTRANDOM, TOBRATROUGH, TOBRAPEAK, TOBRARND, AMIKACINPEAK, AMIKACINTROU, AMIKACIN,  in the last 72 hours   Microbiology: No results found for this or any previous visit (from the past 720 hour(s)).  Medical History: Past Medical History  Diagnosis Date  . Mitral regurgitation due to partially flail posterior mitral valve leaflet   . Rectal cancer   . Hyperlipidemia   . Colon cancer 07/18/12 bx    rectum=Invasive adenocarcinoma w/ extracellular mucin,polyp=benign  . Rheumatic fever as child    hx  . Hearing loss     partial greater on left  . Tinnitus     left ear  . Sinusitis     seasonal  . Arthritis     hands/knees  . Hematochezia     recent  . Heart murmur   . Hypothyroidism as child    hx of  . Numbness     TOES / FINGERS DUE TO CHEMO  . History of shingles   . Blood clot in vein     RT ARM WITH PICC LINE   . GERD (gastroesophageal reflux disease)     OCCASIONAL  . Nocturia   . Radiation     FOR COLON CANCER  . Umbilical hernia, incarcerated - s/p primary repair July 2014 01/03/2013  . Herpes zoster - R flank - with severe pain 03/11/2013  . Ileostomy - diverting loop - s/p takedown  06/28/2013 01/14/2013    Medications:  Scheduled:  . ferrous sulfate  325 mg Oral BID PC  . hydrocortisone  1 application Rectal BID  . multivitamin with minerals  1 tablet Oral q morning - 10a  . polyethylene glycol  17 g Oral Daily   Infusions:  . sodium chloride     Assessment:  68 yr male with history of rectal CA.  Recent hospitalization + enterococcus bacteremia and treated for endocarditis. Completed antibiotics 11 days ago.  Presents with complaint of fever.  Zosyn 3.375gm IV x 1 given @ 18:13 and Vancomycin 1gm IV x 1 given @ 19:04  CrCl (n) ~ 56 ml/min  Blood cultures x 2 and urine culture ordered  Vancomycin and Zosyn per pharmacy dosing ordered for treatment of sepsis  Goal of Therapy:  Vancomycin trough level 15-20 mcg/ml  Plan:  Measure antibiotic drug levels at steady state Follow up culture results Zosyn 3.375gm IV q8h Vancomycin 750mg  IV q12h  Everette Rank, PharmD 08/17/2013,9:05 PM

## 2013-08-17 NOTE — ED Provider Notes (Signed)
CSN: [id number]     Arrival date & time 08/17/13  1606 History   First MD Initiated Contact with Patient 08/17/13 1628     Chief Complaint  Patient presents with  . Fever     (Consider location/radiation/quality/duration/timing/severity/associated sxs/prior Treatment) The history is provided by the patient.  Tyler Diaz is a 68 y.o. male hx of mitral regurg with recent endocarditis, rectal cancer s/p resection and reverse colostomy here with fever. He was omitted a month ago and was diagnosed with endocarditis of the mitral valve. He had a PICC line placed and had finished a course of ampicillin and gentamicin 11 days ago. He was doing well until today when he had a fever 103 at home and took some, lumbar 3 and Tylenol with no relief. Denies any cough or abdominal pain or vomiting. Denies any recent contact with flu.    Past Medical History  Diagnosis Date  . Mitral regurgitation due to partially flail posterior mitral valve leaflet   . Rectal cancer   . Hyperlipidemia   . Colon cancer 07/18/12 bx    rectum=Invasive adenocarcinoma w/ extracellular mucin,polyp=benign  . Rheumatic fever as child    hx  . Hearing loss     partial greater on left  . Tinnitus     left ear  . Sinusitis     seasonal  . Arthritis     hands/knees  . Hematochezia     recent  . Heart murmur   . Hypothyroidism as child    hx of  . Numbness     TOES / FINGERS DUE TO CHEMO  . History of shingles   . Blood clot in vein     RT ARM WITH PICC LINE   . GERD (gastroesophageal reflux disease)     OCCASIONAL  . Nocturia   . Radiation     FOR COLON CANCER  . Umbilical hernia, incarcerated - s/p primary repair July 2014 01/03/2013  . Herpes zoster - R flank - with severe pain 03/11/2013  . Ileostomy - diverting loop - s/p takedown 06/28/2013 01/14/2013   Past Surgical History  Procedure Laterality Date  . Carpal tunnel release Right 2002  . Tonsillectomy      as child  . Laparoscopic low anterior  resection N/A 12/27/2012    Procedure: LAPAROSCOPIC LOW ANTERIOR RESECTION, COLO-ANAL ANASTOMOSIS, DIVERTING LOOP ILEOSTOMY,SPLENIC FLEXURE MOBILIZATION, PRIMARY INCARCERATED UMBILICAL HERNIA REPAIR;  Surgeon: Adin Hector, MD;  Location: [facility];  Service: General;  Laterality: N/A;  . Laparoscopic low anterior rescection with coloanal anastomosis N/A 12/27/2012    Procedure: LAPAROSCOPIC LOW ANTERIOR RESCECTION WITH COLOANAL ANASTOMOSIS;  Surgeon: Adin Hector, MD;  Location: [facility];  Service: General;  Laterality: N/A;  . Ileostomy N/A 12/27/2012    Procedure: DIVERTING LOOP ILEOSTOMY;  Surgeon: Adin Hector, MD;  Location: [facility];  Service: General;  Laterality: N/A;  . Umbilical hernia repair  12/27/2012    Procedure: PRIMARY REPAIR INCARCERATED UMBILICAL HERNIA;  Surgeon: Adin Hector, MD;  Location: [facility];  Service: General;;  . Hernia repair    . Ileostomy closure N/A 06/28/2013    Procedure: loop ILEOSTOMY TAKEDOWN examination of anesthesia ;  Surgeon: Adin Hector, MD;  Location: [facility];  Service: General;  Laterality: N/A;  . Tee without cardioversion N/A 07/10/2013    Procedure: TRANSESOPHAGEAL ECHOCARDIOGRAM (TEE);  Surgeon: Candee Furbish, MD;  Location: [facility];  Service: Cardiovascular;  Laterality: N/A;   Family History  Problem Relation  Age of Onset  . Cancer Mother     abdominal ca ?ovarian?  . Cancer Sister     liver cancer   History  Substance Use Topics  . Smoking status: Former Smoker -- 1.50 packs/day for 20 years    Types: Cigarettes    Quit date: 07/27/1972  . Smokeless tobacco: Never Used  . Alcohol Use: No    Review of Systems  Constitutional: Positive for fever.  All other systems reviewed and are negative.      Allergies  Shrimp  Home Medications   Current Outpatient Rx  Name  Route  Sig  Dispense  Refill  . acetaminophen (TYLENOL) 500 MG tablet   Oral   Take 500 mg by mouth every 6 (six) hours as needed for headache.          Marland Kitchen acetaminophen-codeine (TYLENOL #3) 300-30 MG per tablet   Oral   Take 1 tablet by mouth every 6 (six) hours as needed for moderate pain.   60 tablet   0   . dextrose 5 % SOLN 50 mL with gentamicin 40 MG/ML SOLN   Intravenous   Inject 40 mg into the vein every 12 (twelve) hours. For 28 days.         . diazepam (VALIUM) 2 MG tablet   Oral   Take 2 mg by mouth at bedtime as needed for anxiety.          . diphenoxylate-atropine (LOMOTIL) 2.5-0.025 MG per tablet   Oral   Take 1 tablet by mouth 4 (four) times daily as needed for diarrhea or loose stools.          . ferrous sulfate 325 (65 FE) MG tablet   Oral   Take 1 tablet (325 mg total) by mouth 2 (two) times daily with a meal.   60 tablet   1   . hydrocortisone (PROCTOSOL HC) 2.5 % rectal cream   Rectal   Place 1 application rectally 2 (two) times daily.   30 g   0   . LORazepam (ATIVAN) 0.5 MG tablet   Oral   Take 1-2 tablets (0.5-1 mg total) by mouth at bedtime as needed for anxiety or sleep.   30 tablet   0   . Multiple Vitamin (MULTIVITAMIN WITH MINERALS) TABS   Oral   Take 1 tablet by mouth every morning.          Marland Kitchen oxyCODONE (OXY IR/ROXICODONE) 5 MG immediate release tablet   Oral   Take 1 tablet (5 mg total) by mouth every 6 (six) hours as needed for moderate pain, severe pain or breakthrough pain.   40 tablet   0   . oxymetazoline (AFRIN) 0.05 % nasal spray   Each Nare   Place 1 spray into both nostrils 2 (two) times daily as needed for congestion.         . polyethylene glycol (MIRALAX / GLYCOLAX) packet   Oral   Take 17 g by mouth daily.   30 each   5   . promethazine (PHENERGAN) 25 MG tablet   Oral   Take 25 mg by mouth every 6 (six) hours as needed for nausea.          . sodium chloride 0.9 % SOLN 50 mL with ampicillin 2 G SOLR 2 g   Intravenous   Inject 2 g into the vein every 4 (four) hours.   112 each   0     112 doses (  4 weeks of antibiotics)   . enoxaparin (LOVENOX)  120 MG/0.8ML injection   Subcutaneous   Inject 0.4 mLs (60 mg total) into the skin daily.   0 Syringe        Using prior samples he has on hand    BP 137/59  Pulse 125  Temp(Src) 103.8 F (39.9 C) (Rectal)  Resp 16  SpO2 94% Physical Exam  Nursing note and vitals reviewed. Constitutional: He is oriented to person, place, and time.  Chronically ill, tachypneic, uncomfortable   HENT:  Head: Normocephalic.  Mouth/Throat: Oropharynx is clear and moist.  Eyes: Conjunctivae and EOM are normal. Pupils are equal, round, and reactive to light.  Neck: Normal range of motion. Neck supple.  Cardiovascular: Regular rhythm and normal heart sounds.   Tachycardic   Pulmonary/Chest: Effort normal.  Crackles L base   Abdominal: Soft. Bowel sounds are normal. He exhibits no distension. There is no tenderness. There is no rebound and no guarding.  Musculoskeletal: Normal range of motion. He exhibits no edema.  Neurological: He is alert and oriented to person, place, and time. No cranial nerve deficit. Coordination normal.  Skin: Skin is warm and dry.  Psychiatric: He has a normal mood and affect. His behavior is normal. Judgment and thought content normal.    ED Course  Procedures (including critical care time) Labs Review Labs Reviewed  CBC WITH DIFFERENTIAL - Abnormal; Notable for the following:    WBC 27.1 (*)    RBC 3.59 (*)    Hemoglobin 11.9 (*)    HCT 36.0 (*)    MCV 100.3 (*)    Neutrophils Relative % 83 (*)    Neutro Abs 22.4 (*)    Lymphocytes Relative 5 (*)    Monocytes Absolute 3.3 (*)    All other components within normal limits  COMPREHENSIVE METABOLIC PANEL - Abnormal; Notable for the following:    Sodium 135 (*)    Chloride 95 (*)    Glucose, Bld 100 (*)    GFR calc non Af Amer 57 (*)    GFR calc Af Amer 65 (*)    All other components within normal limits  I-STAT CG4 LACTIC ACID, ED - Abnormal; Notable for the following:    Lactic Acid, Venous 3.27 (*)    All  other components within normal limits  URINE CULTURE  CULTURE, BLOOD (ROUTINE X 2)  CULTURE, BLOOD (ROUTINE X 2)  URINALYSIS, ROUTINE W REFLEX MICROSCOPIC  INFLUENZA PANEL BY PCR (TYPE A & B, H1N1)   Imaging Review Dg Chest 2 View  08/17/2013   CLINICAL DATA:  Fever. History of colon and rectal cancer. Ex-smoker.  EXAM: CHEST  2 VIEW  COMPARISON:  07/05/2013 and chest CT dated 01/23/2013.  FINDINGS: The cardiac silhouette is borderline enlarged and the aorta is tortuous. Increased prominence of the pulmonary vasculature. No definite discrete lung nodules are visible. Mild thoracic spine degenerative changes.  IMPRESSION: Interval borderline cardiomegaly and mild pulmonary vascular congestion.   Electronically Signed   By: Enrique Sack M.D.   On: 08/17/2013 17:28     EKG Interpretation   Date/Time:  Saturday August 17 2013 17:20:58 EDT Ventricular Rate:  121 PR Interval:  152 QRS Duration: 114 QT Interval:  299 QTC Calculation: 424 R Axis:   10 Text Interpretation:  Sinus tachycardia Borderline intraventricular  conduction delay Low voltage, precordial leads No significant change since  last tracing Confirmed by YAO  MD, DAVID ([id number]) on 08/17/2013 5:30:18 PM  Also  confirmed by Darl Householder  MD, Jerome ([id number])  on 08/17/2013 6:47:14 PM      MDM   Final diagnoses:  None   Tyler Diaz is a 68 y.o. male here with fever. I am concerned for possible endocarditis again. Will do sepsis workup and will likely need admission for echo. Will give empiric abx.    5:30 PM WBC 27, CXR nl. Concerned for sepsis. I called ID, Dr. Baxter Flattery, who recommend vanc/zosyn empirically instead of amp/gent. Recommend echo in AM and she will see patient tomorrow. Cultures sent. Will admit.   6:57 PM Will admit to stepdown under Dr. Marisue Humble.    Wandra Arthurs, MD 08/17/13 [phone]

## 2013-08-17 NOTE — H&P (Signed)
[facility] History and Physical  Tyler Diaz ZOX:[id number] DOB: 04-30-1946 DOA: 08/17/2013  Referring physician:  PCP: Rudi Heap, MD  Specialists: Dr. Michaell Cowing  Chief Complaint:  Fever  HPI: Tyler Diaz is a 68 y.o. male  With a history of rectal cancer status post resection and reverse colostomy which was complicated by an endocarditis and bacteremia, that presents to the emergency department with complaints of sudden onset of fever. Patient was recently here approximately one month ago, at which point he was found to have bacteremia with enterococcus and was treated for endocarditis. He was placed on IV gentamicin and ampicillin which he finished approximately 11 days ago. Patient recently saw Dr. Michaell Cowing in the office on 08/16/2013, and was stable. Patient stated that his fever started this morning, for which she has been taking Tylenol. He denies any recent travel or any ill contacts. Patient states that his appetite has been well. He denies any chest pain, shortness of breath, cough or abdominal pain or any diarrhea at this time.  Review of Systems:  Constitutional: Complains of fever. Denies any change in appetite or weight. HEENT: Denies photophobia, eye pain, redness, hearing loss, ear pain, congestion, sore throat, rhinorrhea, sneezing, mouth sores, trouble swallowing, neck pain, neck stiffness and tinnitus.   Respiratory: Denies SOB, DOE, cough, chest tightness,  and wheezing.   Cardiovascular: Denies chest pain, palpitations and leg swelling.  Gastrointestinal: Denies nausea, vomiting, abdominal pain, diarrhea, constipation, blood in stool and abdominal distention.  Genitourinary: Denies dysuria, urgency, frequency, hematuria, flank pain and difficulty urinating.  Musculoskeletal: Denies myalgias, back pain, joint swelling, arthralgias and gait problem.  Skin: Denies pallor, rash and wound.  Neurological: Denies dizziness, seizures, syncope, weakness, light-headedness,  numbness and headaches.  Hematological: Denies adenopathy. Easy bruising, personal or family bleeding history  Psychiatric/Behavioral: Denies suicidal ideation, mood changes, confusion, nervousness, sleep disturbance and agitation  Past Medical History  Diagnosis Date  . Mitral regurgitation due to partially flail posterior mitral valve leaflet   . Rectal cancer   . Hyperlipidemia   . Colon cancer 07/18/12 bx    rectum=Invasive adenocarcinoma w/ extracellular mucin,polyp=benign  . Rheumatic fever as child    hx  . Hearing loss     partial greater on left  . Tinnitus     left ear  . Sinusitis     seasonal  . Arthritis     hands/knees  . Hematochezia     recent  . Heart murmur   . Hypothyroidism as child    hx of  . Numbness     TOES / FINGERS DUE TO CHEMO  . History of shingles   . Blood clot in vein     RT ARM WITH PICC LINE   . GERD (gastroesophageal reflux disease)     OCCASIONAL  . Nocturia   . Radiation     FOR COLON CANCER  . Umbilical hernia, incarcerated - s/p primary repair July 2014 01/03/2013  . Herpes zoster - R flank - with severe pain 03/11/2013  . Ileostomy - diverting loop - s/p takedown 06/28/2013 01/14/2013   Past Surgical History  Procedure Laterality Date  . Carpal tunnel release Right 2002  . Tonsillectomy      as child  . Laparoscopic low anterior resection N/A 12/27/2012    Procedure: LAPAROSCOPIC LOW ANTERIOR RESECTION, COLO-ANAL ANASTOMOSIS, DIVERTING LOOP ILEOSTOMY,SPLENIC FLEXURE MOBILIZATION, PRIMARY INCARCERATED UMBILICAL HERNIA REPAIR;  Surgeon: Ardeth Sportsman, MD;  Location: [facility];  Service: General;  Laterality: N/A;  .  Laparoscopic low anterior rescection with coloanal anastomosis N/A 12/27/2012    Procedure: LAPAROSCOPIC LOW ANTERIOR RESCECTION WITH COLOANAL ANASTOMOSIS;  Surgeon: Ardeth Sportsman, MD;  Location: [facility];  Service: General;  Laterality: N/A;  . Ileostomy N/A 12/27/2012    Procedure: DIVERTING LOOP ILEOSTOMY;  Surgeon: Ardeth Sportsman, MD;  Location: [facility];  Service: General;  Laterality: N/A;  . Umbilical hernia repair  12/27/2012    Procedure: PRIMARY REPAIR INCARCERATED UMBILICAL HERNIA;  Surgeon: Ardeth Sportsman, MD;  Location: [facility];  Service: General;;  . Hernia repair    . Ileostomy closure N/A 06/28/2013    Procedure: loop ILEOSTOMY TAKEDOWN examination of anesthesia ;  Surgeon: Ardeth Sportsman, MD;  Location: [facility];  Service: General;  Laterality: N/A;  . Tee without cardioversion N/A 07/10/2013    Procedure: TRANSESOPHAGEAL ECHOCARDIOGRAM (TEE);  Surgeon: Donato Schultz, MD;  Location: [facility];  Service: Cardiovascular;  Laterality: N/A;   Social History:  reports that he quit smoking about 41 years ago. His smoking use included Cigarettes. He has a 30 pack-year smoking history. He has never used smokeless tobacco. He reports that he does not drink alcohol or use illicit drugs. Lives at home with his wife.  Allergies  Allergen Reactions  . Shrimp [Shellfish Allergy] Hives, Nausea And Vomiting and Rash    Family History  Problem Relation Age of Onset  . Cancer Mother     abdominal ca ?ovarian?  . Cancer Sister     liver cancer      Prior to Admission medications   Medication Sig Start Date End Date Taking? Authorizing Provider  acetaminophen (TYLENOL) 500 MG tablet Take 500 mg by mouth every 6 (six) hours as needed for headache.   Yes Historical Provider, MD  acetaminophen-codeine (TYLENOL #3) 300-30 MG per tablet Take 1 tablet by mouth every 6 (six) hours as needed for moderate pain. 07/30/13  Yes Judyann Munson, MD  diazepam (VALIUM) 2 MG tablet Take 2 mg by mouth at bedtime as needed for anxiety.  05/28/13  Yes Historical Provider, MD  diphenoxylate-atropine (LOMOTIL) 2.5-0.025 MG per tablet Take 1 tablet by mouth 4 (four) times daily as needed for diarrhea or loose stools.  05/20/13  Yes Historical Provider, MD  ferrous sulfate 325 (65 FE) MG tablet Take 1 tablet (325 mg total) by mouth 2 (two)  times daily with a meal. 08/16/13  Yes Ardeth Sportsman, MD  hydrocortisone (PROCTOSOL HC) 2.5 % rectal cream Place 1 application rectally 2 (two) times daily. 08/16/13  Yes Ardeth Sportsman, MD  LORazepam (ATIVAN) 0.5 MG tablet Take 1-2 tablets (0.5-1 mg total) by mouth at bedtime as needed for anxiety or sleep. 07/22/13  Yes Rana Snare, NP  Multiple Vitamin (MULTIVITAMIN WITH MINERALS) TABS Take 1 tablet by mouth every morning.    Yes Historical Provider, MD  oxyCODONE (OXY IR/ROXICODONE) 5 MG immediate release tablet Take 1 tablet (5 mg total) by mouth every 6 (six) hours as needed for moderate pain, severe pain or breakthrough pain. 08/16/13  Yes Ardeth Sportsman, MD  oxymetazoline (AFRIN) 0.05 % nasal spray Place 1 spray into both nostrils 2 (two) times daily as needed for congestion.   Yes Historical Provider, MD  polyethylene glycol (MIRALAX / GLYCOLAX) packet Take 17 g by mouth daily. 08/16/13  Yes Ardeth Sportsman, MD  promethazine (PHENERGAN) 25 MG tablet Take 25 mg by mouth every 6 (six) hours as needed for nausea.  08/20/12  Yes Historical  Provider, MD   Physical Exam: Filed Vitals:   08/17/13 1628  BP: 137/59  Pulse: 125  Temp: 103.8 F (39.9 C)  Resp: 16     General: Well developed, well nourished, NAD, appears stated age  HEENT: NCAT, PERRLA, EOMI, Anicteic Sclera, mucous membranes moist.   Neck: Supple, no JVD, no masses  Cardiovascular: S1 S2 auscultated, tachycardic, 2/6 SEM  Respiratory: Bibasilar crackles, normal effort, no wheezing  Abdomen: Soft, nontender, nondistended, + bowel sounds  Extremities: warm dry without cyanosis clubbing or edema  Neuro: AAOx3, cranial nerves grossly intact. Strength 5/5 in patient's upper and lower extremities bilaterally  Skin: Without rashes exudates or nodules  Psych: Normal affect and demeanor with intact judgement and insight  Labs on Admission:  Basic Metabolic Panel:  Recent Labs Lab 08/17/13 1700  NA 135*  K 4.6  CL  95*  CO2 25  GLUCOSE 100*  BUN 21  CREATININE 1.26  CALCIUM 9.6   Liver Function Tests:  Recent Labs Lab 08/17/13 1700  AST 30  ALT 30  ALKPHOS 83  BILITOT 0.9  PROT 7.1  ALBUMIN 3.6   No results found for this basename: LIPASE, AMYLASE,  in the last 168 hours No results found for this basename: AMMONIA,  in the last 168 hours CBC:  Recent Labs Lab 08/17/13 1700  WBC 27.1*  NEUTROABS 22.4*  HGB 11.9*  HCT 36.0*  MCV 100.3*  PLT 173   Cardiac Enzymes: No results found for this basename: CKTOTAL, CKMB, CKMBINDEX, TROPONINI,  in the last 168 hours  BNP (last 3 results) No results found for this basename: PROBNP,  in the last 8760 hours CBG: No results found for this basename: GLUCAP,  in the last 168 hours  Radiological Exams on Admission: Dg Chest 2 View  08/17/2013   CLINICAL DATA:  Fever. History of colon and rectal cancer. Ex-smoker.  EXAM: CHEST  2 VIEW  COMPARISON:  07/05/2013 and chest CT dated 01/23/2013.  FINDINGS: The cardiac silhouette is borderline enlarged and the aorta is tortuous. Increased prominence of the pulmonary vasculature. No definite discrete lung nodules are visible. Mild thoracic spine degenerative changes.  IMPRESSION: Interval borderline cardiomegaly and mild pulmonary vascular congestion.   Electronically Signed   By: Gordan Payment M.D.   On: 08/17/2013 17:28    EKG: Independently reviewed. Sinus tachycardia rate 121  Assessment/Plan  Sepsis/fever Patient will be admitted to step down unit. He recently was found to have bacteremia with enterococcus as well as endocarditis and has been treated with IV gentamicin and ampicillin. Patient recently completed his course approximately 11 days ago. At this time he does exhibit leukocytosis with and is febrile. UA essentially negative, chest x-ray shows no infiltrates. Blood cultures currently pending. Influenza swab also pending. Will obtain a C. difficile PCR. Will place patient on broad spectrum  antibiotics of vancomycin and Zosyn and IV fluid. Dr. Jerolyn Center contacted by the ED physician.  Recent ileostomy takedown complicated by bacteremia/endocarditis Patient recently completed course of antibiotics. May need repeat echocardiogram.  ID consulted.  History of rectal cancer/colon cancer status post radiation Patient recently seen by Dr. Michaell Cowing.  Depression/anxiety Continue Valium  Hyperlipidemia On no medications.  History of mitral regurgitation Patient sees Dr. Donnie Aho.  Normocytic anemia Patient's baseline is approximately 9-10. His hemoglobin currently is 11.9. Suspect this will trend downward with IV fluids. Will continue to monitor. Will continue iron supplementation.  DVT prophylaxis: SCDs  Code Status: Full  Condition: Guarded  Family Communication:  Family at bedside. Admission, patients condition and plan of care including tests being ordered have been discussed with the patient and family who indicate understanding and agree with the plan and Code Status.  Disposition Plan: Admitted   Time spent: 60 minutes  Francia Verry D.O. [facility] Pager [phone]  If 7PM-7AM, please contact night-coverage [email or web address] Password [email or web address] 08/17/2013, 7:20 PM

## 2013-08-17 NOTE — ED Notes (Addendum)
Pt c/o fever and generally not feeling well.  Pt's wife reports that he had a fever of 103.4 this morning.  Pt has been given "Tylenol 3 and regular Tylenol."  Hx colon/rectal cancer.  Pt was admitted to [facility] in January 2015 for a "blood infection" and completed antibiotics x 11 days ago.

## 2013-08-18 DIAGNOSIS — R6510 Systemic inflammatory response syndrome (SIRS) of non-infectious origin without acute organ dysfunction: Secondary | ICD-10-CM

## 2013-08-18 DIAGNOSIS — I059 Rheumatic mitral valve disease, unspecified: Secondary | ICD-10-CM

## 2013-08-18 DIAGNOSIS — R509 Fever, unspecified: Secondary | ICD-10-CM

## 2013-08-18 LAB — BASIC METABOLIC PANEL
BUN: 18 mg/dL (ref 6–23)
CHLORIDE: 101 meq/L (ref 96–112)
CO2: 25 mEq/L (ref 19–32)
Calcium: 8.6 mg/dL (ref 8.4–10.5)
Creatinine, Ser: 1.39 mg/dL — ABNORMAL HIGH (ref 0.50–1.35)
GFR, EST AFRICAN AMERICAN: 58 mL/min — AB (ref >90)
GFR, EST NON AFRICAN AMERICAN: 50 mL/min — AB (ref >90)
Glucose, Bld: 113 mg/dL — ABNORMAL HIGH (ref 70–99)
POTASSIUM: 4.3 meq/L (ref 3.7–5.3)
Sodium: 137 mEq/L (ref 137–147)

## 2013-08-18 LAB — INFLUENZA PANEL BY PCR (TYPE A & B)
H1N1FLUPCR: NOT DETECTED
Influenza A By PCR: NEGATIVE
Influenza B By PCR: NEGATIVE

## 2013-08-18 LAB — CBC
HEMATOCRIT: 29.8 % — AB (ref 39.0–52.0)
Hemoglobin: 10.2 g/dL — ABNORMAL LOW (ref 13.0–17.0)
MCH: 34.2 pg — ABNORMAL HIGH (ref 26.0–34.0)
MCHC: 34.2 g/dL (ref 30.0–36.0)
MCV: 100 fL (ref 78.0–100.0)
PLATELETS: 148 10*3/uL — AB (ref 150–400)
RBC: 2.98 MIL/uL — AB (ref 4.22–5.81)
RDW: 13.9 % (ref 11.5–15.5)
WBC: 23.8 10*3/uL — AB (ref 4.0–10.5)

## 2013-08-18 LAB — CLOSTRIDIUM DIFFICILE BY PCR: CDIFFPCR: POSITIVE — AB

## 2013-08-18 MED ORDER — METRONIDAZOLE IN NACL 5-0.79 MG/ML-% IV SOLN
500.0000 mg | Freq: Three times a day (TID) | INTRAVENOUS | Status: DC
  Administered 2013-08-19 – 2013-08-20 (×5): 500 mg via INTRAVENOUS
  Filled 2013-08-18 (×9): qty 100

## 2013-08-18 MED ORDER — VANCOMYCIN 50 MG/ML ORAL SOLUTION
500.0000 mg | Freq: Four times a day (QID) | ORAL | Status: DC
  Administered 2013-08-19 – 2013-08-22 (×13): 500 mg via ORAL
  Filled 2013-08-18 (×19): qty 10

## 2013-08-18 NOTE — Progress Notes (Signed)
[facility] PROGRESS NOTE  QUANTARIUS GENRICH ELF:[id number] DOB: [birth year] DOA: 08/17/2013 PCP: Redge Gainer, MD  Assessment/Plan: Sepsis/Fever -Etiology remains unclear. -UA/CXR negative. -?relapse of enterococcal endocarditis. -Recheck blood cx, 2D ECHO. -Appreciate ID input. -Continue broad spectrum antibiotics for now.  History of rectal cancer -Followed by Dr. Johney Maine. -Received radiation. -Continue OP follow up.  Code Status: Full Code Family Communication: None  Disposition Plan: Home when ready   Consultants:  ID   Antibiotics:  Vanc  Zosyn   Subjective: No complaints  Objective: Filed Vitals:   08/18/13 0800 08/18/13 0900 08/18/13 1000 08/18/13 1100  BP: 92/50 98/48 99/53  90/58  Pulse: 108 95 92 87  Temp: 99.9 F (37.7 C)     TempSrc: Oral     Resp: 13 15 12 14   Height:      Weight:      SpO2: 93% 95% 96% 98%    Intake/Output Summary (Last 24 hours) at 08/18/13 1548 Last data filed at 08/18/13 1110  Gross per 24 hour  Intake   1650 ml  Output   1200 ml  Net    450 ml   Filed Weights   08/17/13 2021  Weight: 80.8 kg (178 lb 2.1 oz)    Exam:   General:  AA Ox3  Cardiovascular: RRR  Respiratory: CTA B  Abdomen: S/NT/ND/+BS  Extremities: trace bilateral edema   Neurologic:  Non-focal  Data Reviewed: Basic Metabolic Panel:  Recent Labs Lab 08/17/13 1700 08/18/13 0320  NA 135* 137  K 4.6 4.3  CL 95* 101  CO2 25 25  GLUCOSE 100* 113*  BUN 21 18  CREATININE 1.26 1.39*  CALCIUM 9.6 8.6   Liver Function Tests:  Recent Labs Lab 08/17/13 1700  AST 30  ALT 30  ALKPHOS 83  BILITOT 0.9  PROT 7.1  ALBUMIN 3.6   No results found for this basename: LIPASE, AMYLASE,  in the last 168 hours No results found for this basename: AMMONIA,  in the last 168 hours CBC:  Recent Labs Lab 08/17/13 1700 08/18/13 0320  WBC 27.1* 23.8*  NEUTROABS 22.4*  --   HGB 11.9* 10.2*  HCT 36.0* 29.8*  MCV 100.3* 100.0  PLT 173 148*    Cardiac Enzymes: No results found for this basename: CKTOTAL, CKMB, CKMBINDEX, TROPONINI,  in the last 168 hours BNP (last 3 results) No results found for this basename: PROBNP,  in the last 8760 hours CBG: No results found for this basename: GLUCAP,  in the last 168 hours  Recent Results (from the past 240 hour(s))  CULTURE, BLOOD (ROUTINE X 2)     Status: None   Collection Time    08/17/13  5:00 PM      Result Value Ref Range Status   Specimen Description BLOOD BLOOD RIGHT FOREARM   Final   Special Requests A   Final   Culture  Setup Time     Final   Value: 08/17/2013 21:33     Performed at [facility]   Culture     Final   Value:        BLOOD CULTURE RECEIVED NO GROWTH TO DATE CULTURE WILL BE HELD FOR 5 DAYS BEFORE ISSUING A FINAL NEGATIVE REPORT     Performed at [facility]   Report Status PENDING   Incomplete  CULTURE, BLOOD (ROUTINE X 2)     Status: None   Collection Time    08/17/13  5:00 PM  Result Value Ref Range Status   Specimen Description BLOOD LEFT ANTECUBITAL   Final   Special Requests BOTTLES DRAWN AEROBIC AND ANAEROBIC [facility] EACH   Final   Culture  Setup Time     Final   Value: 08/17/2013 21:33     Performed at [facility]   Culture     Final   Value:        BLOOD CULTURE RECEIVED NO GROWTH TO DATE CULTURE WILL BE HELD FOR 5 DAYS BEFORE ISSUING A FINAL NEGATIVE REPORT     Performed at [facility]   Report Status PENDING   Incomplete  MRSA PCR SCREENING     Status: None   Collection Time    08/17/13  8:40 PM      Result Value Ref Range Status   MRSA by PCR NEGATIVE  NEGATIVE Final   Comment:            The GeneXpert MRSA Assay (FDA     approved for NASAL specimens     only), is one component of a     comprehensive MRSA colonization     surveillance program. It is not     intended to diagnose MRSA     infection nor to guide or     monitor treatment for     MRSA infections.     Studies: Dg Chest 2  View  08/17/2013   CLINICAL DATA:  Fever. History of colon and rectal cancer. Ex-smoker.  EXAM: CHEST  2 VIEW  COMPARISON:  07/05/2013 and chest CT dated 01/23/2013.  FINDINGS: The cardiac silhouette is borderline enlarged and the aorta is tortuous. Increased prominence of the pulmonary vasculature. No definite discrete lung nodules are visible. Mild thoracic spine degenerative changes.  IMPRESSION: Interval borderline cardiomegaly and mild pulmonary vascular congestion.   Electronically Signed   By: Enrique Sack M.D.   On: 08/17/2013 17:28    Scheduled Meds: . ferrous sulfate  325 mg Oral BID PC  . hydrocortisone  1 application Rectal BID  . multivitamin with minerals  1 tablet Oral q morning - 10a  . piperacillin-tazobactam (ZOSYN)  IV  3.375 g Intravenous Q8H  . polyethylene glycol  17 g Oral Daily  . vancomycin  750 mg Intravenous Q12H   Continuous Infusions: . sodium chloride 100 mL/hr at 08/18/13 0630    Principal Problem:   Sepsis Active Problems:   Mitral regurgitation due to partially flail posterior mitral valve leaflet   Bacterial endocarditis    Time spent: 25 minutes. Greater than 50% of this time was spent in direct contact with the patient coordinating care.    Lelon Frohlich  [facility] Pager [phone]  If 7PM-7AM, please contact night-coverage at [email or web address], password [facility] 08/18/2013, 3:48 PM  LOS: 1 day

## 2013-08-18 NOTE — Progress Notes (Signed)
UR completed 

## 2013-08-18 NOTE — Progress Notes (Signed)
cdiff pcr is positive. Started him on oral vanco 500mg  QID and IV metronidazole. Other IV antibiotics have been discontinued. His presentation is likely due to cdiff colitis

## 2013-08-18 NOTE — Consult Note (Signed)
[place] for Infectious Disease  Total days of antibiotics 2        Day 2 vancomycin        Day 2 piptazo              Reason for Consult: fever in patient recently treated for enterococcal endocarditis    Referring Physician: Jerilee Hoh  Active Problems:   Sepsis     HPI: Tyler Diaz is a 68 y.o. male with hx of MR 2/2 partially flail posterior mitral valve leaflet,  stage 3 well-differentiated adenocarcinoma of rectum-[id number] s/p resection with very low coloanal anastomosis and diverting loop ileostomy in July 2014 s/p neoadjuvant xeloda and radiation c/b DVT. S/p  ileostomy takedown on 1/23-1/26/15. He was admitted for fevers, myalgia, headache on 1/30 and found to have amp S enterococcus bacteremia. Since he had known history of mitral regurgitation due to posterior flail leaflet, he underwent TEE on 2/4 for rule out of endocarditis. Based upon cardiologists evaluation, there was shaggy appearance on posterior leaflet that was unable to exclude small vegetations, thus recommended to treat for presumed NVE. He was treated with 4 weeks of ampicillin and gentamicin which ended on 08/04/13. He was last seen in the ID clinic on 2/24 where he was tolerating the antibiotic regimen without difficulty. He was last seen by Dr. Johney Maine on 08/16/13, and at this visit, no complaints, recovering steadily from ileostomy takedown. His son reports that he had a rectal exam where he bleed alittle bit but otherwise was fine. He presents to the ED on 3/14 with 1 day history of having high fever of 103F, malaise/fatigue, like he had flu like symptoms without any cough or sore throat sx, starting the morning of 3/14; it responded to tylenol. No cough, diarrhea,but he does report having low abdominal pain which he reports has been going on since the ostomy reversal surgery. In the ED, his temp was 103.60F,  labs in the ED revealed wbc of 27.1 with 83%N, LA of 3.27. Cr 1.26(cr of 1/11 on 2/23). He was started on  broad spectrum antibiotics of vancomycin and piptazo. UA is clean. Flu screen neg. CXR shows mild pulm congestion. Repeat cbc this morning shows decrease in leukocytosis of 24K. Fever curve trending down in the first 12 hr but also receiving antipyretics. No sick contacts.   Past Medical History  Diagnosis Date  . Mitral regurgitation due to partially flail posterior mitral valve leaflet   . Rectal cancer   . Hyperlipidemia   . Colon cancer 07/18/12 bx    rectum=Invasive adenocarcinoma w/ extracellular mucin,polyp=benign  . Rheumatic fever as child    hx  . Hearing loss     partial greater on left  . Tinnitus     left ear  . Sinusitis     seasonal  . Arthritis     hands/knees  . Hematochezia     recent  . Heart murmur   . Hypothyroidism as child    hx of  . Numbness     TOES / FINGERS DUE TO CHEMO  . History of shingles   . Blood clot in vein     RT ARM WITH PICC LINE   . GERD (gastroesophageal reflux disease)     OCCASIONAL  . Nocturia   . Radiation     FOR COLON CANCER  . Umbilical hernia, incarcerated - s/p primary repair July 2014 01/03/2013  . Herpes zoster - R flank - with severe pain 03/11/2013  .  Ileostomy - diverting loop - s/p takedown 06/28/2013 01/14/2013    Allergies:  Allergies  Allergen Reactions  . Shrimp [Shellfish Allergy] Hives, Nausea And Vomiting and Rash    MEDICATIONS: . ferrous sulfate  325 mg Oral BID PC  . hydrocortisone  1 application Rectal BID  . multivitamin with minerals  1 tablet Oral q morning - 10a  . piperacillin-tazobactam (ZOSYN)  IV  3.375 g Intravenous Q8H  . polyethylene glycol  17 g Oral Daily  . vancomycin  750 mg Intravenous Q12H    History  Substance Use Topics  . Smoking status: Former Smoker -- 1.50 packs/day for 20 years    Types: Cigarettes    Quit date: 07/27/1972  . Smokeless tobacco: Never Used  . Alcohol Use: No    Family History  Problem Relation Age of Onset  . Cancer Mother     abdominal ca ?ovarian?    . Cancer Sister     liver cancer     Review of Systems  Constitutional: Negative for fever, chills, diaphoresis, activity change, appetite change, fatigue and unexpected weight change.  HENT: Negative for congestion, sore throat, rhinorrhea, sneezing, trouble swallowing and sinus pressure.  Eyes: Negative for photophobia and visual disturbance.  Respiratory: Negative for cough, chest tightness, shortness of breath, wheezing and stridor.  Cardiovascular: Negative for chest pain, palpitations and leg swelling.  Gastrointestinal: Negative for nausea, vomiting, abdominal pain, diarrhea, constipation, blood in stool, abdominal distention and anal bleeding.  Genitourinary: Negative for dysuria, hematuria, flank pain and difficulty urinating.  Musculoskeletal: Negative for myalgias, back pain, joint swelling, arthralgias and gait problem.  Skin: Negative for color change, pallor, rash and wound.  Neurological: Negative for dizziness, tremors, weakness and light-headedness.  Hematological: Negative for adenopathy. Does not bruise/bleed easily.  Psychiatric/Behavioral: Negative for behavioral problems, confusion, sleep disturbance, dysphoric mood, decreased concentration and agitation.     OBJECTIVE: Temp:  [98.6 F (37 C)-103.8 F (39.9 C)] 99.9 F (37.7 C) (03/15 0800) Pulse Rate:  [98-125] 111 (03/15 0700) Resp:  [13-23] 13 (03/15 0700) BP: (100-137)/(50-70) 125/70 mmHg (03/15 0700) SpO2:  [94 %-98 %] 98 % (03/15 0700) Weight:  [178 lb 2.1 oz (80.8 kg)] 178 lb 2.1 oz (80.8 kg) (03/14 2021)  Constitutional: He is oriented to person, place, and time. He appears well-developed and well-nourished. Appears fatigued. No distress.  HENT:  Mouth/Throat: Oropharynx is clear and moist. No oropharyngeal exudate. Dry oral mucosa, no thrush Cardiovascular: Normal rate, regular rhythm and normal heart sounds.3/6 SEM BH at apex radiating to axilla. Exam reveals no gallop and no friction  rub. Pulmonary/Chest: Effort normal and breath sounds normal. No respiratory distress. He has no wheezes.  Abdominal: Soft. Bowel sounds are normal. He exhibits no distension. Mild tenderness in both lower quadrants no rebound Lymphadenopathy:  no cervical adenopathy.  Neurological: He is alert and oriented to person, place, and time.  Skin: Skin is warm and dry. No rash noted. No erythema.  Psychiatric: He has a normal mood and affect. His behavior is normal.    LABS: Results for orders placed during the hospital encounter of 08/17/13 (from the past 48 hour(s))  CBC WITH DIFFERENTIAL     Status: Abnormal   Collection Time    08/17/13  5:00 PM      Result Value Ref Range   WBC 27.1 (*) 4.0 - 10.5 K/uL   RBC 3.59 (*) 4.22 - 5.81 MIL/uL   Hemoglobin 11.9 (*) 13.0 - 17.0 g/dL   HCT 36.0 (*)  39.0 - 52.0 %   MCV 100.3 (*) 78.0 - 100.0 fL   MCH 33.1  26.0 - 34.0 pg   MCHC 33.1  30.0 - 36.0 g/dL   RDW 13.7  11.5 - 15.5 %   Platelets 173  150 - 400 K/uL   Neutrophils Relative % 83 (*) 43 - 77 %   Neutro Abs 22.4 (*) 1.7 - 7.7 K/uL   Lymphocytes Relative 5 (*) 12 - 46 %   Lymphs Abs 1.3  0.7 - 4.0 K/uL   Monocytes Relative 12  3 - 12 %   Monocytes Absolute 3.3 (*) 0.1 - 1.0 K/uL   Eosinophils Relative 0  0 - 5 %   Eosinophils Absolute 0.0  0.0 - 0.7 K/uL   Basophils Relative 0  0 - 1 %   Basophils Absolute 0.0  0.0 - 0.1 K/uL  COMPREHENSIVE METABOLIC PANEL     Status: Abnormal   Collection Time    08/17/13  5:00 PM      Result Value Ref Range   Sodium 135 (*) 137 - 147 mEq/L   Potassium 4.6  3.7 - 5.3 mEq/L   Chloride 95 (*) 96 - 112 mEq/L   CO2 25  19 - 32 mEq/L   Glucose, Bld 100 (*) 70 - 99 mg/dL   BUN 21  6 - 23 mg/dL   Creatinine, Ser 1.26  0.50 - 1.35 mg/dL   Calcium 9.6  8.4 - 10.5 mg/dL   Total Protein 7.1  6.0 - 8.3 g/dL   Albumin 3.6  3.5 - 5.2 g/dL   AST 30  0 - 37 U/L   ALT 30  0 - 53 U/L   Alkaline Phosphatase 83  39 - 117 U/L   Total Bilirubin 0.9  0.3 - 1.2  mg/dL   GFR calc non Af Amer 57 (*) >90 mL/min   GFR calc Af Amer 65 (*) >90 mL/min   Comment: (NOTE)     The eGFR has been calculated using the CKD EPI equation.     This calculation has not been validated in all clinical situations.     eGFR's persistently <90 mL/min signify possible Chronic Kidney     Disease.  I-STAT CG4 LACTIC ACID, ED     Status: Abnormal   Collection Time    08/17/13  5:28 PM      Result Value Ref Range   Lactic Acid, Venous 3.27 (*) 0.5 - 2.2 mmol/L  URINALYSIS, ROUTINE W REFLEX MICROSCOPIC     Status: None   Collection Time    08/17/13  5:52 PM      Result Value Ref Range   Color, Urine YELLOW  YELLOW   APPearance CLEAR  CLEAR   Specific Gravity, Urine 1.016  1.005 - 1.030   pH 6.5  5.0 - 8.0   Glucose, UA NEGATIVE  NEGATIVE mg/dL   Hgb urine dipstick NEGATIVE  NEGATIVE   Bilirubin Urine NEGATIVE  NEGATIVE   Ketones, ur NEGATIVE  NEGATIVE mg/dL   Protein, ur NEGATIVE  NEGATIVE mg/dL   Urobilinogen, UA 0.2  0.0 - 1.0 mg/dL   Nitrite NEGATIVE  NEGATIVE   Leukocytes, UA NEGATIVE  NEGATIVE   Comment: MICROSCOPIC NOT DONE ON URINES WITH NEGATIVE PROTEIN, BLOOD, LEUKOCYTES, NITRITE, OR GLUCOSE <1000 mg/dL.  MRSA PCR SCREENING     Status: None   Collection Time    08/17/13  8:40 PM      Result Value Ref Range  MRSA by PCR NEGATIVE  NEGATIVE   Comment:            The GeneXpert MRSA Assay (FDA     approved for NASAL specimens     only), is one component of a     comprehensive MRSA colonization     surveillance program. It is not     intended to diagnose MRSA     infection nor to guide or     monitor treatment for     MRSA infections.  INFLUENZA PANEL BY PCR (TYPE A & B, H1N1)     Status: None   Collection Time    08/17/13  9:27 PM      Result Value Ref Range   Influenza A By PCR NEGATIVE  NEGATIVE   Influenza B By PCR NEGATIVE  NEGATIVE   H1N1 flu by pcr NOT DETECTED  NOT DETECTED   Comment:            The Xpert Flu assay (FDA approved for      nasal aspirates or washes and     nasopharyngeal swab specimens), is     intended as an aid in the diagnosis of     influenza and should not be used as     a sole basis for treatment.     Performed at [facility] PANEL     Status: Abnormal   Collection Time    08/18/13  3:20 AM      Result Value Ref Range   Sodium 137  137 - 147 mEq/L   Potassium 4.3  3.7 - 5.3 mEq/L   Chloride 101  96 - 112 mEq/L   CO2 25  19 - 32 mEq/L   Glucose, Bld 113 (*) 70 - 99 mg/dL   BUN 18  6 - 23 mg/dL   Creatinine, Ser 1.39 (*) 0.50 - 1.35 mg/dL   Calcium 8.6  8.4 - 10.5 mg/dL   GFR calc non Af Amer 50 (*) >90 mL/min   GFR calc Af Amer 58 (*) >90 mL/min   Comment: (NOTE)     The eGFR has been calculated using the CKD EPI equation.     This calculation has not been validated in all clinical situations.     eGFR's persistently <90 mL/min signify possible Chronic Kidney     Disease.  CBC     Status: Abnormal   Collection Time    08/18/13  3:20 AM      Result Value Ref Range   WBC 23.8 (*) 4.0 - 10.5 K/uL   RBC 2.98 (*) 4.22 - 5.81 MIL/uL   Hemoglobin 10.2 (*) 13.0 - 17.0 g/dL   HCT 29.8 (*) 39.0 - 52.0 %   MCV 100.0  78.0 - 100.0 fL   MCH 34.2 (*) 26.0 - 34.0 pg   MCHC 34.2  30.0 - 36.0 g/dL   RDW 13.9  11.5 - 15.5 %   Platelets 148 (*) 150 - 400 K/uL    MICRO: 3/14 blood cx pending 3/14 urine cx pending IMAGING: Dg Chest 2 View  08/17/2013   CLINICAL DATA:  Fever. History of colon and rectal cancer. Ex-smoker.  EXAM: CHEST  2 VIEW  COMPARISON:  07/05/2013 and chest CT dated 01/23/2013.  FINDINGS: The cardiac silhouette is borderline enlarged and the aorta is tortuous. Increased prominence of the pulmonary vasculature. No definite discrete lung nodules are visible. Mild thoracic spine degenerative changes.  IMPRESSION: Interval borderline cardiomegaly  and mild pulmonary vascular congestion.   Electronically Signed   By: Enrique Sack M.D.   On: 08/17/2013 17:28    Assessment/Plan:  68yo M with history of rectal ca s/p resection and chemo and reverse colostomy c/b presumed enterococcal endocarditis/bacteremia, finished 4 wk of amp/gent on 08/04/13, now presents with SIRS/sepsis. Started on vancomycin and piptazo, cultures pending.  - etiology of fever still unknown, possibly would be a relapse of enterococcus. He was initially only treated for 4 wk since he presented early in his hospital admission and that TEE did not have vegetation but MV posterior valve had baseline irregularities - recommend to get 2 D echo  - will repeat blood cx today to see if we can determine if he has relapse with enterococcus - continue him on broad spectrum until results return to narrow regimen - cdiff testing is pending, collected at noon  - if blood cx remain negative in next 48hrs and he still has ongoing abd pain,  would consider getting abd/pelvis CT  Tashawn Greff [facility] [phone]

## 2013-08-18 NOTE — Progress Notes (Signed)
Echocardiogram 2D Echocardiogram has been performed.  Doyle Askew 08/18/2013, 2:20 PM

## 2013-08-19 DIAGNOSIS — A0472 Enterocolitis due to Clostridium difficile, not specified as recurrent: Secondary | ICD-10-CM

## 2013-08-19 HISTORY — DX: Enterocolitis due to Clostridium difficile, not specified as recurrent: A04.72

## 2013-08-19 LAB — BASIC METABOLIC PANEL
BUN: 15 mg/dL (ref 6–23)
CALCIUM: 8.4 mg/dL (ref 8.4–10.5)
CHLORIDE: 102 meq/L (ref 96–112)
CO2: 24 meq/L (ref 19–32)
Creatinine, Ser: 1.46 mg/dL — ABNORMAL HIGH (ref 0.50–1.35)
GFR calc Af Amer: 55 mL/min — ABNORMAL LOW (ref >90)
GFR calc non Af Amer: 47 mL/min — ABNORMAL LOW (ref >90)
Glucose, Bld: 119 mg/dL — ABNORMAL HIGH (ref 70–99)
Potassium: 3.8 mEq/L (ref 3.7–5.3)
Sodium: 138 mEq/L (ref 137–147)

## 2013-08-19 LAB — CBC
HEMATOCRIT: 27.4 % — AB (ref 39.0–52.0)
HEMOGLOBIN: 8.8 g/dL — AB (ref 13.0–17.0)
MCH: 32.6 pg (ref 26.0–34.0)
MCHC: 32.1 g/dL (ref 30.0–36.0)
MCV: 101.5 fL — ABNORMAL HIGH (ref 78.0–100.0)
Platelets: 149 10*3/uL — ABNORMAL LOW (ref 150–400)
RBC: 2.7 MIL/uL — ABNORMAL LOW (ref 4.22–5.81)
RDW: 14 % (ref 11.5–15.5)
WBC: 16.1 10*3/uL — ABNORMAL HIGH (ref 4.0–10.5)

## 2013-08-19 LAB — URINE CULTURE: Colony Count: 30000

## 2013-08-19 MED ORDER — OXYCODONE HCL 5 MG PO TABS
5.0000 mg | ORAL_TABLET | ORAL | Status: DC | PRN
  Administered 2013-08-19 – 2013-08-21 (×9): 5 mg via ORAL
  Filled 2013-08-19 (×7): qty 1

## 2013-08-19 NOTE — Progress Notes (Signed)
INFECTIOUS DISEASE PROGRESS NOTE  ID: Tyler Diaz is a 68 y.o. male with  Principal Problem:   Sepsis Active Problems:   Mitral regurgitation due to partially flail posterior mitral valve leaflet   Bacterial endocarditis   C. difficile colitis  Subjective: 3 loose BM today.  Awaiting anti-nausea rx Ate chicken sandwich for lunch  Abtx:  Anti-infectives   Start     Dose/Rate Route Frequency Ordered Stop   08/18/13 2330  vancomycin (VANCOCIN) 50 mg/mL oral solution 500 mg     500 mg Oral 4 times per day 08/18/13 2315     08/18/13 2330  metroNIDAZOLE (FLAGYL) IVPB 500 mg     500 mg 100 mL/hr over 60 Minutes Intravenous Every 8 hours 08/18/13 2315     08/18/13 0700  vancomycin (VANCOCIN) IVPB 750 mg/150 ml premix  Status:  Discontinued     750 mg 150 mL/hr over 60 Minutes Intravenous Every 12 hours 08/17/13 2119 08/18/13 2313   08/18/13 0200  piperacillin-tazobactam (ZOSYN) IVPB 3.375 g  Status:  Discontinued     3.375 g 12.5 mL/hr over 240 Minutes Intravenous Every 8 hours 08/17/13 2119 08/18/13 2313   08/17/13 1745  vancomycin (VANCOCIN) IVPB 1000 mg/200 mL premix     1,000 mg 200 mL/hr over 60 Minutes Intravenous  Once 08/17/13 1739 08/17/13 2004   08/17/13 1745  piperacillin-tazobactam (ZOSYN) IVPB 3.375 g     3.375 g 100 mL/hr over 30 Minutes Intravenous  Once 08/17/13 1739 08/17/13 1904      Medications:  Scheduled: . ferrous sulfate  325 mg Oral BID PC  . hydrocortisone  1 application Rectal BID  . metronidazole  500 mg Intravenous Q8H  . multivitamin with minerals  1 tablet Oral q morning - 10a  . polyethylene glycol  17 g Oral Daily  . vancomycin  500 mg Oral 4 times per day    Objective: Vital signs in last 24 hours: Temp:  [98.7 F (37.1 C)-99.7 F (37.6 C)] 98.7 F (37.1 C) (03/16 1430) Pulse Rate:  [89-103] 97 (03/16 1430) Resp:  [9-19] 16 (03/16 1430) BP: (96-125)/(30-74) 118/61 mmHg (03/16 1430) SpO2:  [94 %-99 %] 96 % (03/16 1430) Weight:   [81.9 kg (180 lb 8.9 oz)-84.3 kg (185 lb 13.6 oz)] 84.3 kg (185 lb 13.6 oz) (03/16 1430)   General appearance: alert, cooperative and no distress Resp: clear to auscultation bilaterally Cardio: regularly irregular rhythm and systolic murmur: early systolic 3/6, crescendo at 2nd left intercostal space GI: normal findings: bowel sounds normal and soft. wound well healed.  and abnormal findings:  tender LLQ. no gaurding.   Lab Results  Recent Labs  08/18/13 0320 08/19/13 0330  WBC 23.8* 16.1*  HGB 10.2* 8.8*  HCT 29.8* 27.4*  NA 137 138  K 4.3 3.8  CL 101 102  CO2 25 24  BUN 18 15  CREATININE 1.39* 1.46*   Liver Panel  Recent Labs  08/17/13 1700  PROT 7.1  ALBUMIN 3.6  AST 30  ALT 30  ALKPHOS 83  BILITOT 0.9   Sedimentation Rate No results found for this basename: ESRSEDRATE,  in the last 72 hours C-Reactive Protein No results found for this basename: CRP,  in the last 72 hours  Microbiology: Recent Results (from the past 240 hour(s))  CULTURE, BLOOD (ROUTINE X 2)     Status: None   Collection Time    08/17/13  5:00 PM      Result Value Ref Range Status  Specimen Description BLOOD BLOOD RIGHT FOREARM   Final   Special Requests A   Final   Culture  Setup Time     Final   Value: 08/17/2013 21:33     Performed at [facility]   Culture     Final   Value:        BLOOD CULTURE RECEIVED NO GROWTH TO DATE CULTURE WILL BE HELD FOR 5 DAYS BEFORE ISSUING A FINAL NEGATIVE REPORT     Performed at [facility]   Report Status PENDING   Incomplete  CULTURE, BLOOD (ROUTINE X 2)     Status: None   Collection Time    08/17/13  5:00 PM      Result Value Ref Range Status   Specimen Description BLOOD LEFT ANTECUBITAL   Final   Special Requests BOTTLES DRAWN AEROBIC AND ANAEROBIC [facility] EACH   Final   Culture  Setup Time     Final   Value: 08/17/2013 21:33     Performed at [facility]   Culture     Final   Value:        BLOOD CULTURE RECEIVED NO GROWTH  TO DATE CULTURE WILL BE HELD FOR 5 DAYS BEFORE ISSUING A FINAL NEGATIVE REPORT     Performed at [facility]   Report Status PENDING   Incomplete  URINE CULTURE     Status: None   Collection Time    08/17/13  5:52 PM      Result Value Ref Range Status   Specimen Description URINE, CATHETERIZED   Final   Special Requests NONE   Final   Culture  Setup Time     Final   Value: 08/17/2013 21:49     Performed at [facility] Count     Final   Value: 30,000 COLONIES/ML     Performed at [facility]   Culture     Final   Value: Multiple bacterial morphotypes present, none predominant. Suggest appropriate recollection if clinically indicated.     Performed at [facility]   Report Status 08/19/2013 FINAL   Final  MRSA PCR SCREENING     Status: None   Collection Time    08/17/13  8:40 PM      Result Value Ref Range Status   MRSA by PCR NEGATIVE  NEGATIVE Final   Comment:            The GeneXpert MRSA Assay (FDA     approved for NASAL specimens     only), is one component of a     comprehensive MRSA colonization     surveillance program. It is not     intended to diagnose MRSA     infection nor to guide or     monitor treatment for     MRSA infections.  CULTURE, BLOOD (ROUTINE X 2)     Status: None   Collection Time    08/18/13 11:45 AM      Result Value Ref Range Status   Specimen Description BLOOD RIGHT ARM   Final   Special Requests     Final   Value: BOTTLES DRAWN AEROBIC AND ANAEROBIC 10CC BOTH BOTTLES   Culture  Setup Time     Final   Value: 08/18/2013 18:16     Performed at [facility]   Culture     Final   Value:        BLOOD  CULTURE RECEIVED NO GROWTH TO DATE CULTURE WILL BE HELD FOR 5 DAYS BEFORE ISSUING A FINAL NEGATIVE REPORT     Performed at [facility]   Report Status PENDING   Incomplete  CULTURE, BLOOD (ROUTINE X 2)     Status: None   Collection Time    08/18/13 12:00 PM      Result Value Ref Range  Status   Specimen Description BLOOD RIGHT HAND   Final   Special Requests BOTTLES DRAWN AEROBIC AND ANAEROBIC 1CC   Final   Culture  Setup Time     Final   Value: 08/18/2013 18:17     Performed at [facility]   Culture     Final   Value:        BLOOD CULTURE RECEIVED NO GROWTH TO DATE CULTURE WILL BE HELD FOR 5 DAYS BEFORE ISSUING A FINAL NEGATIVE REPORT     Performed at [facility]   Report Status PENDING   Incomplete  CLOSTRIDIUM DIFFICILE BY PCR     Status: Abnormal   Collection Time    08/18/13 12:37 PM      Result Value Ref Range Status   C difficile by pcr POSITIVE (*) NEGATIVE Final   Comment: CRITICAL RESULT CALLED TO, READ BACK BY AND VERIFIED WITH:     BARHAM,M RN AT 1919 08/18/13 MITCHELL,L     PERFORMED AT [facility]     Performed at [facility]    Studies/Results: Dg Chest 2 View  08/17/2013   CLINICAL DATA:  Fever. History of colon and rectal cancer. Ex-smoker.  EXAM: CHEST  2 VIEW  COMPARISON:  07/05/2013 and chest CT dated 01/23/2013.  FINDINGS: The cardiac silhouette is borderline enlarged and the aorta is tortuous. Increased prominence of the pulmonary vasculature. No definite discrete lung nodules are visible. Mild thoracic spine degenerative changes.  IMPRESSION: Interval borderline cardiomegaly and mild pulmonary vascular congestion.   Electronically Signed   By: Enrique Sack M.D.   On: 08/17/2013 17:28     Assessment/Plan: C diff Enterococcal Bacteremia (? IE) 07-05-13  Completed Amp/Gent 08-04-13 Rectal CA  TTE shows MV lesion, would consider TEE to follow this up.  Repeat BCx 3-15 are (-) WBC improved, watch abd exam.  no change in his anbx for now.   Total days of antibiotics Day 2 IV Flagyl/PO vanco         Bobby Rumpf Infectious Diseases (pager) [phone] [email or web address] 08/19/2013, 3:22 PM  LOS: 2 days

## 2013-08-19 NOTE — Progress Notes (Signed)
[place], MD, [address] [place], [place] [address] Phone: [phone] FAX: [phone]    Tyler Diaz [id number] 11-Nov-1945  CARE TEAM:  PCP: Redge Gainer, MD  Outpatient Care Team: Patient Care Team: Chipper Herb, MD as PCP - General (Family Medicine) Tyler Hector, MD as Consulting Physician (Colon and Rectal Surgery) Lear Ng, MD as Consulting Physician (Gastroenterology) Tyler Mayer, MD as Consulting Physician (Radiation Oncology) Carola Frost, RN as Registered Nurse (Oncology) [name], MD as Consulting Physician (Oncology) Carlyle Basques, MD as Consulting Physician (Infectious Diseases)  Inpatient Treatment Team: Treatment Team: Attending Provider: Erline Hau, MD; Rounding Team: Threasa Beards, MD; Registered Nurse: Roena Malady, RN; Registered Nurse: Heriberto Antigua, RN; Respiratory Therapist: Miquel Dunn, RRT; Consulting Physician: Tyler Hector, MD   Subjective:  Minimal rectal bleeding the first day or so after rectal exam in office last week  Had some abdominal cramping.  Took narcotics.  Some mildly loose stools 1-2 a day but no severe diarrhea.  Feeling a little better but disheartened to be back in the hospital.  Objective:  Vital signs:  Filed Vitals:   08/19/13 0200 08/19/13 0300 08/19/13 0400 08/19/13 0500  BP: 119/65 109/59 121/58 96/30  Pulse: 95 97 95 94  Temp:   98.9 F (37.2 C)   TempSrc:   Oral   Resp: 19 18 9 13   Height:      Weight:    180 lb 8.9 oz (81.9 kg)  SpO2: 98% 97% 98% 95%    Last BM Date: 08/17/13  Intake/Output   Yesterday:  03/15 0701 - 03/16 0700 In: 2150 [P.O.:200; I.V.:1500; IV Piggyback:450] Out: 2301 [Urine:2300; Stool:1] This shift:     Bowel function:  Flatus: y  BM: y, foul  Drain: n/a  Physical Exam:  General: Pt awake/alert/oriented x4 in no acute  distress Eyes: PERRL, normal EOM.  Sclera clear.  No icterus Neuro: CN II-XII intact w/o focal sensory/motor deficits. Lymph: No head/neck/groin lymphadenopathy Psych:  No delerium/psychosis/paranoia HENT: Normocephalic, Mucus membranes moist.  No thrush Neck: Supple, No tracheal deviation Chest: No chest wall pain w good excursion CV:  Pulses intact.  Regular rhythm MS: Normal AROM mjr joints.  No obvious deformity Abdomen: Soft.  Nondistended.  Mildly tender L abdomen.  No evidence of peritonitis.  No incarcerated hernias. Ext:  SCDs BLE.  No mjr edema.  No cyanosis Skin: No petechiae / purpura   Problem List:   Principal Problem:   Sepsis Active Problems:   Mitral regurgitation due to partially flail posterior mitral valve leaflet   Bacterial endocarditis   C. difficile colitis   Assessment  Tyler Diaz  68 y.o. male       C. Difficile colitis.  No evidence of any surgical pathology  Plan:  I am sorry that he has had another problem, but I am glad that he is feeling better already.   White count improving.  Pain minimal - no peritonitis.  No evidence of shock to me this morning.  I am hopeful that he will continue to improve.  The patient is stable.  There is no evidence of peritonitis, acute abdomen, nor shock.  There is no strong evidence of failure of improvement nor decline with current non-operative management.  There is no need for surgery at the present moment.  I will available as needed.   -oral  vancomycin and antibiotic regimen per Dr. Baxter Flattery with infectious disease and primary team.  Advanced diet as tolerated.  Probable marginal benefit with probiotic, but no penalty.  Consider that.  Inferior to ID primary service.  -VTE prophylaxis- SCDs, etc  -mobilize as tolerated to help recovery  Tyler Diaz, M.D., F.A.C.S. Gastrointestinal and Minimally Invasive Surgery [facility] [address] [place], [place]  [phone] Main / Paging   08/19/2013   Results:   Labs: Results for orders placed during the hospital encounter of 08/17/13 (from the past 48 hour(s))  CBC WITH DIFFERENTIAL     Status: Abnormal   Collection Time    08/17/13  5:00 PM      Result Value Ref Range   WBC 27.1 (*) 4.0 - 10.5 K/uL   RBC 3.59 (*) 4.22 - 5.81 MIL/uL   Hemoglobin 11.9 (*) 13.0 - 17.0 g/dL   HCT 36.0 (*) 39.0 - 52.0 %   MCV 100.3 (*) 78.0 - 100.0 fL   MCH 33.1  26.0 - 34.0 pg   MCHC 33.1  30.0 - 36.0 g/dL   RDW 13.7  11.5 - 15.5 %   Platelets 173  150 - 400 K/uL   Neutrophils Relative % 83 (*) 43 - 77 %   Neutro Abs 22.4 (*) 1.7 - 7.7 K/uL   Lymphocytes Relative 5 (*) 12 - 46 %   Lymphs Abs 1.3  0.7 - 4.0 K/uL   Monocytes Relative 12  3 - 12 %   Monocytes Absolute 3.3 (*) 0.1 - 1.0 K/uL   Eosinophils Relative 0  0 - 5 %   Eosinophils Absolute 0.0  0.0 - 0.7 K/uL   Basophils Relative 0  0 - 1 %   Basophils Absolute 0.0  0.0 - 0.1 K/uL  COMPREHENSIVE METABOLIC PANEL     Status: Abnormal   Collection Time    08/17/13  5:00 PM      Result Value Ref Range   Sodium 135 (*) 137 - 147 mEq/L   Potassium 4.6  3.7 - 5.3 mEq/L   Chloride 95 (*) 96 - 112 mEq/L   CO2 25  19 - 32 mEq/L   Glucose, Bld 100 (*) 70 - 99 mg/dL   BUN 21  6 - 23 mg/dL   Creatinine, Ser 1.26  0.50 - 1.35 mg/dL   Calcium 9.6  8.4 - 10.5 mg/dL   Total Protein 7.1  6.0 - 8.3 g/dL   Albumin 3.6  3.5 - 5.2 g/dL   AST 30  0 - 37 U/L   ALT 30  0 - 53 U/L   Alkaline Phosphatase 83  39 - 117 U/L   Total Bilirubin 0.9  0.3 - 1.2 mg/dL   GFR calc non Af Amer 57 (*) >90 mL/min   GFR calc Af Amer 65 (*) >90 mL/min   Comment: (NOTE)     The eGFR has been calculated using the CKD EPI equation.     This calculation has not been validated in all clinical situations.     eGFR's persistently <90 mL/min signify possible Chronic Kidney     Disease.  CULTURE, BLOOD (ROUTINE X 2)     Status: None   Collection Time    08/17/13   5:00 PM      Result Value Ref Range   Specimen Description BLOOD BLOOD RIGHT FOREARM     Special Requests A     Culture  Setup  Time       Value: 08/17/2013 21:33     Performed at [facility]   Culture       Value:        BLOOD CULTURE RECEIVED NO GROWTH TO DATE CULTURE WILL BE HELD FOR 5 DAYS BEFORE ISSUING A FINAL NEGATIVE REPORT     Performed at [facility]   Report Status PENDING    CULTURE, BLOOD (ROUTINE X 2)     Status: None   Collection Time    08/17/13  5:00 PM      Result Value Ref Range   Specimen Description BLOOD LEFT ANTECUBITAL     Special Requests BOTTLES DRAWN AEROBIC AND ANAEROBIC 6CC EACH     Culture  Setup Time       Value: 08/17/2013 21:33     Performed at [facility]   Culture       Value:        BLOOD CULTURE RECEIVED NO GROWTH TO DATE CULTURE WILL BE HELD FOR 5 DAYS BEFORE ISSUING A FINAL NEGATIVE REPORT     Performed at [facility]   Report Status PENDING    I-STAT CG4 LACTIC ACID, ED     Status: Abnormal   Collection Time    08/17/13  5:28 PM      Result Value Ref Range   Lactic Acid, Venous 3.27 (*) 0.5 - 2.2 mmol/L  URINE CULTURE     Status: None   Collection Time    08/17/13  5:52 PM      Result Value Ref Range   Specimen Description URINE, CATHETERIZED     Special Requests NONE     Culture  Setup Time       Value: 08/17/2013 21:49     Performed at [place] PENDING     Culture       Value: Culture reincubated for better growth     Performed at [facility]   Report Status PENDING    URINALYSIS, ROUTINE W REFLEX MICROSCOPIC     Status: None   Collection Time    08/17/13  5:52 PM      Result Value Ref Range   Color, Urine YELLOW  YELLOW   APPearance CLEAR  CLEAR   Specific Gravity, Urine 1.016  1.005 - 1.030   pH 6.5  5.0 - 8.0   Glucose, UA NEGATIVE  NEGATIVE mg/dL   Hgb urine dipstick NEGATIVE  NEGATIVE   Bilirubin Urine NEGATIVE  NEGATIVE   Ketones, ur NEGATIVE   NEGATIVE mg/dL   Protein, ur NEGATIVE  NEGATIVE mg/dL   Urobilinogen, UA 0.2  0.0 - 1.0 mg/dL   Nitrite NEGATIVE  NEGATIVE   Leukocytes, UA NEGATIVE  NEGATIVE   Comment: MICROSCOPIC NOT DONE ON URINES WITH NEGATIVE PROTEIN, BLOOD, LEUKOCYTES, NITRITE, OR GLUCOSE <1000 mg/dL.  MRSA PCR SCREENING     Status: None   Collection Time    08/17/13  8:40 PM      Result Value Ref Range   MRSA by PCR NEGATIVE  NEGATIVE   Comment:            The GeneXpert MRSA Assay (FDA     approved for NASAL specimens     only), is one component of a     comprehensive MRSA colonization     surveillance program. It is not     intended to diagnose MRSA     infection  nor to guide or     monitor treatment for     MRSA infections.  INFLUENZA PANEL BY PCR (TYPE A & B, H1N1)     Status: None   Collection Time    08/17/13  9:27 PM      Result Value Ref Range   Influenza A By PCR NEGATIVE  NEGATIVE   Influenza B By PCR NEGATIVE  NEGATIVE   H1N1 flu by pcr NOT DETECTED  NOT DETECTED   Comment:            The Xpert Flu assay (FDA approved for     nasal aspirates or washes and     nasopharyngeal swab specimens), is     intended as an aid in the diagnosis of     influenza and should not be used as     a sole basis for treatment.     Performed at [place] PANEL     Status: Abnormal   Collection Time    08/18/13  3:20 AM      Result Value Ref Range   Sodium 137  137 - 147 mEq/L   Potassium 4.3  3.7 - 5.3 mEq/L   Chloride 101  96 - 112 mEq/L   CO2 25  19 - 32 mEq/L   Glucose, Bld 113 (*) 70 - 99 mg/dL   BUN 18  6 - 23 mg/dL   Creatinine, Ser 1.39 (*) 0.50 - 1.35 mg/dL   Calcium 8.6  8.4 - 10.5 mg/dL   GFR calc non Af Amer 50 (*) >90 mL/min   GFR calc Af Amer 58 (*) >90 mL/min   Comment: (NOTE)     The eGFR has been calculated using the CKD EPI equation.     This calculation has not been validated in all clinical situations.     eGFR's persistently <90 mL/min signify possible  Chronic Kidney     Disease.  CBC     Status: Abnormal   Collection Time    08/18/13  3:20 AM      Result Value Ref Range   WBC 23.8 (*) 4.0 - 10.5 K/uL   RBC 2.98 (*) 4.22 - 5.81 MIL/uL   Hemoglobin 10.2 (*) 13.0 - 17.0 g/dL   HCT 29.8 (*) 39.0 - 52.0 %   MCV 100.0  78.0 - 100.0 fL   MCH 34.2 (*) 26.0 - 34.0 pg   MCHC 34.2  30.0 - 36.0 g/dL   RDW 13.9  11.5 - 15.5 %   Platelets 148 (*) 150 - 400 K/uL  CULTURE, BLOOD (ROUTINE X 2)     Status: None   Collection Time    08/18/13 11:45 AM      Result Value Ref Range   Specimen Description BLOOD RIGHT ARM     Special Requests       Value: BOTTLES DRAWN AEROBIC AND ANAEROBIC 10CC BOTH BOTTLES   Culture  Setup Time       Value: 08/18/2013 18:16     Performed at [facility]   Culture       Value:        BLOOD CULTURE RECEIVED NO GROWTH TO DATE CULTURE WILL BE HELD FOR 5 DAYS BEFORE ISSUING A FINAL NEGATIVE REPORT     Performed at [facility]   Report Status PENDING    CULTURE, BLOOD (ROUTINE X 2)     Status: None   Collection Time  08/18/13 12:00 PM      Result Value Ref Range   Specimen Description BLOOD RIGHT HAND     Special Requests BOTTLES DRAWN AEROBIC AND ANAEROBIC 1CC     Culture  Setup Time       Value: 08/18/2013 18:17     Performed at [facility]   Culture       Value:        BLOOD CULTURE RECEIVED NO GROWTH TO DATE CULTURE WILL BE HELD FOR 5 DAYS BEFORE ISSUING A FINAL NEGATIVE REPORT     Performed at [facility]   Report Status PENDING    CLOSTRIDIUM DIFFICILE BY PCR     Status: Abnormal   Collection Time    08/18/13 12:37 PM      Result Value Ref Range   C difficile by pcr POSITIVE (*) NEGATIVE   Comment: CRITICAL RESULT CALLED TO, READ BACK BY AND VERIFIED WITH:     BARHAM,M RN AT 1919 08/18/13 MITCHELL,L     PERFORMED AT [facility]     Performed at [place] METABOLIC PANEL     Status: Abnormal   Collection Time    08/19/13  3:30 AM       Result Value Ref Range   Sodium 138  137 - 147 mEq/L   Potassium 3.8  3.7 - 5.3 mEq/L   Chloride 102  96 - 112 mEq/L   CO2 24  19 - 32 mEq/L   Glucose, Bld 119 (*) 70 - 99 mg/dL   BUN 15  6 - 23 mg/dL   Creatinine, Ser 1.46 (*) 0.50 - 1.35 mg/dL   Calcium 8.4  8.4 - 10.5 mg/dL   GFR calc non Af Amer 47 (*) >90 mL/min   GFR calc Af Amer 55 (*) >90 mL/min   Comment: (NOTE)     The eGFR has been calculated using the CKD EPI equation.     This calculation has not been validated in all clinical situations.     eGFR's persistently <90 mL/min signify possible Chronic Kidney     Disease.  CBC     Status: Abnormal   Collection Time    08/19/13  3:30 AM      Result Value Ref Range   WBC 16.1 (*) 4.0 - 10.5 K/uL   Comment: WHITE COUNT CONFIRMED ON SMEAR   RBC 2.70 (*) 4.22 - 5.81 MIL/uL   Hemoglobin 8.8 (*) 13.0 - 17.0 g/dL   HCT 27.4 (*) 39.0 - 52.0 %   MCV 101.5 (*) 78.0 - 100.0 fL   MCH 32.6  26.0 - 34.0 pg   MCHC 32.1  30.0 - 36.0 g/dL   RDW 14.0  11.5 - 15.5 %   Platelets 149 (*) 150 - 400 K/uL    Imaging / Studies: Dg Chest 2 View  08/17/2013   CLINICAL DATA:  Fever. History of colon and rectal cancer. Ex-smoker.  EXAM: CHEST  2 VIEW  COMPARISON:  07/05/2013 and chest CT dated 01/23/2013.  FINDINGS: The cardiac silhouette is borderline enlarged and the aorta is tortuous. Increased prominence of the pulmonary vasculature. No definite discrete lung nodules are visible. Mild thoracic spine degenerative changes.  IMPRESSION: Interval borderline cardiomegaly and mild pulmonary vascular congestion.   Electronically Signed   By: Enrique Sack M.D.   On: 08/17/2013 17:28    Medications / Allergies: per chart  Antibiotics: Anti-infectives   Start     Dose/Rate Route Frequency  Ordered Stop   08/18/13 2330  vancomycin (VANCOCIN) 50 mg/mL oral solution 500 mg     500 mg Oral 4 times per day 08/18/13 2315     08/18/13 2330  metroNIDAZOLE (FLAGYL) IVPB 500 mg     500 mg 100 mL/hr over 60  Minutes Intravenous Every 8 hours 08/18/13 2315     08/18/13 0700  vancomycin (VANCOCIN) IVPB 750 mg/150 ml premix  Status:  Discontinued     750 mg 150 mL/hr over 60 Minutes Intravenous Every 12 hours 08/17/13 2119 08/18/13 2313   08/18/13 0200  piperacillin-tazobactam (ZOSYN) IVPB 3.375 g  Status:  Discontinued     3.375 g 12.5 mL/hr over 240 Minutes Intravenous Every 8 hours 08/17/13 2119 08/18/13 2313   08/17/13 1745  vancomycin (VANCOCIN) IVPB 1000 mg/200 mL premix     1,000 mg 200 mL/hr over 60 Minutes Intravenous  Once 08/17/13 1739 08/17/13 2004   08/17/13 1745  piperacillin-tazobactam (ZOSYN) IVPB 3.375 g     3.375 g 100 mL/hr over 30 Minutes Intravenous  Once 08/17/13 1739 08/17/13 1904       Note: This dictation was prepared with Dragon/digital dictation along with [employer]. Any transcriptional errors that result from this process are unintentional.

## 2013-08-19 NOTE — Progress Notes (Signed)
[facility] PROGRESS NOTE  Tyler Diaz [id number] DOB: [birth year] DOA: 08/17/2013 PCP: Redge Gainer, MD  Assessment/Plan: Sepsis/Fever -From C. Diff Colitis. -UA/CXR negative.  C Diff Colitis -Continue PO vanc/flagyl. -no diarrhea since last pm.  History of rectal cancer -Followed by Dr. Johney Maine. -Received radiation. -Continue OP follow up.  Code Status: Full Code Family Communication: None  Disposition Plan: Home when ready. Transfer to floor today. PT/OT evals.   Consultants:  ID   Antibiotics:  Vanc PO  Flagyl  Subjective: No complaints other than weakness/fatigue.  Objective: Filed Vitals:   08/19/13 0500 08/19/13 0800 08/19/13 1117 08/19/13 1200  BP: 96/30 117/58  120/74  Pulse: 94 89 95   Temp:  98.8 F (37.1 C)    TempSrc:  Oral    Resp: 13 13 13 16   Height:      Weight: 81.9 kg (180 lb 8.9 oz)     SpO2: 95% 94% 97% 97%    Intake/Output Summary (Last 24 hours) at 08/19/13 1427 Last data filed at 08/19/13 1400  Gross per 24 hour  Intake   1930 ml  Output   2251 ml  Net   -321 ml   Filed Weights   08/17/13 2021 08/19/13 0500  Weight: 80.8 kg (178 lb 2.1 oz) 81.9 kg (180 lb 8.9 oz)    Exam:   General:  AA Ox3  Cardiovascular: RRR  Respiratory: CTA B  Abdomen: S/NT/ND/+BS  Extremities: trace bilateral edema   Neurologic:  Non-focal  Data Reviewed: Basic Metabolic Panel:  Recent Labs Lab 08/17/13 1700 08/18/13 0320 08/19/13 0330  NA 135* 137 138  K 4.6 4.3 3.8  CL 95* 101 102  CO2 25 25 24   GLUCOSE 100* 113* 119*  BUN 21 18 15   CREATININE 1.26 1.39* 1.46*  CALCIUM 9.6 8.6 8.4   Liver Function Tests:  Recent Labs Lab 08/17/13 1700  AST 30  ALT 30  ALKPHOS 83  BILITOT 0.9  PROT 7.1  ALBUMIN 3.6   No results found for this basename: LIPASE, AMYLASE,  in the last 168 hours No results found for this basename: AMMONIA,  in the last 168 hours CBC:  Recent Labs Lab 08/17/13 1700 08/18/13 0320  08/19/13 0330  WBC 27.1* 23.8* 16.1*  NEUTROABS 22.4*  --   --   HGB 11.9* 10.2* 8.8*  HCT 36.0* 29.8* 27.4*  MCV 100.3* 100.0 101.5*  PLT 173 148* 149*   Cardiac Enzymes: No results found for this basename: CKTOTAL, CKMB, CKMBINDEX, TROPONINI,  in the last 168 hours BNP (last 3 results) No results found for this basename: PROBNP,  in the last 8760 hours CBG: No results found for this basename: GLUCAP,  in the last 168 hours  Recent Results (from the past 240 hour(s))  CULTURE, BLOOD (ROUTINE X 2)     Status: None   Collection Time    08/17/13  5:00 PM      Result Value Ref Range Status   Specimen Description BLOOD BLOOD RIGHT FOREARM   Final   Special Requests A   Final   Culture  Setup Time     Final   Value: 08/17/2013 21:33     Performed at [facility]   Culture     Final   Value:        BLOOD CULTURE RECEIVED NO GROWTH TO DATE CULTURE WILL BE HELD FOR 5 DAYS BEFORE ISSUING A FINAL NEGATIVE REPORT     Performed at [facility]  Report Status PENDING   Incomplete  CULTURE, BLOOD (ROUTINE X 2)     Status: None   Collection Time    08/17/13  5:00 PM      Result Value Ref Range Status   Specimen Description BLOOD LEFT ANTECUBITAL   Final   Special Requests BOTTLES DRAWN AEROBIC AND ANAEROBIC [facility] EACH   Final   Culture  Setup Time     Final   Value: 08/17/2013 21:33     Performed at [facility]   Culture     Final   Value:        BLOOD CULTURE RECEIVED NO GROWTH TO DATE CULTURE WILL BE HELD FOR 5 DAYS BEFORE ISSUING A FINAL NEGATIVE REPORT     Performed at [facility]   Report Status PENDING   Incomplete  URINE CULTURE     Status: None   Collection Time    08/17/13  5:52 PM      Result Value Ref Range Status   Specimen Description URINE, CATHETERIZED   Final   Special Requests NONE   Final   Culture  Setup Time     Final   Value: 08/17/2013 21:49     Performed at [facility]     Final   Value: 30,000  COLONIES/ML     Performed at [facility]   Culture     Final   Value: Multiple bacterial morphotypes present, none predominant. Suggest appropriate recollection if clinically indicated.     Performed at [facility]   Report Status 08/19/2013 FINAL   Final  MRSA PCR SCREENING     Status: None   Collection Time    08/17/13  8:40 PM      Result Value Ref Range Status   MRSA by PCR NEGATIVE  NEGATIVE Final   Comment:            The GeneXpert MRSA Assay (FDA     approved for NASAL specimens     only), is one component of a     comprehensive MRSA colonization     surveillance program. It is not     intended to diagnose MRSA     infection nor to guide or     monitor treatment for     MRSA infections.  CULTURE, BLOOD (ROUTINE X 2)     Status: None   Collection Time    08/18/13 11:45 AM      Result Value Ref Range Status   Specimen Description BLOOD RIGHT ARM   Final   Special Requests     Final   Value: BOTTLES DRAWN AEROBIC AND ANAEROBIC 10CC BOTH BOTTLES   Culture  Setup Time     Final   Value: 08/18/2013 18:16     Performed at [facility]   Culture     Final   Value:        BLOOD CULTURE RECEIVED NO GROWTH TO DATE CULTURE WILL BE HELD FOR 5 DAYS BEFORE ISSUING A FINAL NEGATIVE REPORT     Performed at [facility]   Report Status PENDING   Incomplete  CULTURE, BLOOD (ROUTINE X 2)     Status: None   Collection Time    08/18/13 12:00 PM      Result Value Ref Range Status   Specimen Description BLOOD RIGHT HAND   Final   Special Requests BOTTLES DRAWN AEROBIC AND ANAEROBIC [place]   Final  Culture  Setup Time     Final   Value: 08/18/2013 18:17     Performed at [facility]   Culture     Final   Value:        BLOOD CULTURE RECEIVED NO GROWTH TO DATE CULTURE WILL BE HELD FOR 5 DAYS BEFORE ISSUING A FINAL NEGATIVE REPORT     Performed at [facility]   Report Status PENDING   Incomplete  CLOSTRIDIUM DIFFICILE BY PCR     Status:  Abnormal   Collection Time    08/18/13 12:37 PM      Result Value Ref Range Status   C difficile by pcr POSITIVE (*) NEGATIVE Final   Comment: CRITICAL RESULT CALLED TO, READ BACK BY AND VERIFIED WITH:     BARHAM,M RN AT 1919 08/18/13 MITCHELL,L     PERFORMED AT [facility]     Performed at [facility]     Studies: Dg Chest 2 View  08/17/2013   CLINICAL DATA:  Fever. History of colon and rectal cancer. Ex-smoker.  EXAM: CHEST  2 VIEW  COMPARISON:  07/05/2013 and chest CT dated 01/23/2013.  FINDINGS: The cardiac silhouette is borderline enlarged and the aorta is tortuous. Increased prominence of the pulmonary vasculature. No definite discrete lung nodules are visible. Mild thoracic spine degenerative changes.  IMPRESSION: Interval borderline cardiomegaly and mild pulmonary vascular congestion.   Electronically Signed   By: Enrique Sack M.D.   On: 08/17/2013 17:28    Scheduled Meds: . ferrous sulfate  325 mg Oral BID PC  . hydrocortisone  1 application Rectal BID  . metronidazole  500 mg Intravenous Q8H  . multivitamin with minerals  1 tablet Oral q morning - 10a  . polyethylene glycol  17 g Oral Daily  . vancomycin  500 mg Oral 4 times per day   Continuous Infusions:    Principal Problem:   Sepsis Active Problems:   Mitral regurgitation due to partially flail posterior mitral valve leaflet   Bacterial endocarditis   C. difficile colitis    Time spent: 25 minutes. Greater than 50% of this time was spent in direct contact with the patient coordinating care.    Tyler Diaz  [facility] Pager [phone]  If 7PM-7AM, please contact night-coverage at [email or web address], password [facility] 08/19/2013, 2:27 PM  LOS: 2 days

## 2013-08-20 LAB — CBC
HCT: 27.8 % — ABNORMAL LOW (ref 39.0–52.0)
HEMOGLOBIN: 9.4 g/dL — AB (ref 13.0–17.0)
MCH: 33.3 pg (ref 26.0–34.0)
MCHC: 33.8 g/dL (ref 30.0–36.0)
MCV: 98.6 fL (ref 78.0–100.0)
Platelets: 160 10*3/uL (ref 150–400)
RBC: 2.82 MIL/uL — ABNORMAL LOW (ref 4.22–5.81)
RDW: 13.5 % (ref 11.5–15.5)
WBC: 10.7 10*3/uL — ABNORMAL HIGH (ref 4.0–10.5)

## 2013-08-20 LAB — BASIC METABOLIC PANEL
BUN: 13 mg/dL (ref 6–23)
CO2: 23 mEq/L (ref 19–32)
Calcium: 8.9 mg/dL (ref 8.4–10.5)
Chloride: 100 mEq/L (ref 96–112)
Creatinine, Ser: 1.15 mg/dL (ref 0.50–1.35)
GFR calc non Af Amer: 63 mL/min — ABNORMAL LOW (ref >90)
GFR, EST AFRICAN AMERICAN: 73 mL/min — AB (ref >90)
GLUCOSE: 99 mg/dL (ref 70–99)
Potassium: 3.7 mEq/L (ref 3.7–5.3)
SODIUM: 137 meq/L (ref 137–147)

## 2013-08-20 NOTE — Progress Notes (Signed)
Chaplain consulted with RN. Chaplain provided pastoral care and support to patient. Chaplain listened supportively as the patient shared his feelings surrounding being in the hospital again. Chaplain provided active/reflective listening as the patient talked about his family and previous work life. Chaplain provided spiritual support through prayer.   08/20/13 1500  Clinical Encounter Type  Visited With Patient;Health care provider  Visit Type Initial;Spiritual support;Social support  Spiritual Encounters  Spiritual Needs Prayer;Emotional  Stress Factors  Patient Stress Factors Family relationships;Health changes

## 2013-08-20 NOTE — Evaluation (Signed)
Physical Therapy Evaluation Patient Details Name: Tyler Diaz MRN: [id number] DOB: [birth year] Today's Date: 08/20/2013 Time: 6144-3154 PT Time Calculation (min): 21 min  PT Assessment / Plan / Recommendation History of Present Illness  Pt was admited with sepsis. He has a h/o rectal CA with reverse colostomy  Clinical Impression  On eval, pt was Min guard-Supervision level assist for mobility-able to ambulate ~1000 feet without assistive device. Recommend daily mobility with nursing supervision during hospital stay. PT will follow at least one more session to ensure pt is mobilizing during stay. No follow up PT needs at discharge.     PT Assessment  Patient needs continued PT services    Follow Up Recommendations  No PT follow up    Does the patient have the potential to tolerate intense rehabilitation      Barriers to Discharge        Equipment Recommendations  None recommended by PT    Recommendations for Other Services     Frequency Min 2X/week    Precautions / Restrictions Precautions Precautions: Fall Restrictions Weight Bearing Restrictions: No   Pertinent Vitals/Pain No c/o pain      Mobility  Bed Mobility Overal bed mobility: Independent Transfers Overall transfer level: Modified independent Ambulation/Gait Ambulation/Gait assistance: Supervision;Min guard Ambulation Distance (Feet): 1000 Feet Assistive device: None General Gait Details: intermittent stumbling/wavering but no LOB. fair gait speed.     Exercises     PT Diagnosis: Difficulty walking;Generalized weakness  PT Problem List: Decreased mobility;Decreased balance PT Treatment Interventions: Gait training;Stair training;Therapeutic activities;Therapeutic exercise;Patient/family education;Balance training     PT Goals(Current goals can be found in the care plan section) Acute Rehab PT Goals Patient Stated Goal: walk; get strength back PT Goal Formulation: With patient Time For Goal  Achievement: 08/27/13 Potential to Achieve Goals: Good  Visit Information  Last PT Received On: 08/20/13 Assistance Needed: +1 History of Present Illness: Pt was admited with sepsis. He has a h/o rectal CA with reverse colostomy       Prior Functioning  Home Living Family/patient expects to be discharged to:: Private residence Living Arrangements: Spouse/significant other Type of Home: House Home Access: Stairs to enter Technical brewer of Steps: 4 Entrance Stairs-Rails: None Home Layout: One level Home Equipment: Grab bars - tub/shower Additional Comments: sits at bottom of tub Prior Function Level of Independence: Independent Communication Communication: HOH Dominant Hand: Right    Cognition  Cognition Arousal/Alertness: Awake/alert Behavior During Therapy: WFL for tasks assessed/performed Overall Cognitive Status: Within Functional Limits for tasks assessed    Extremity/Trunk Assessment Upper Extremity Assessment Upper Extremity Assessment: Defer to OT evaluation Lower Extremity Assessment Lower Extremity Assessment: Overall WFL for tasks assessed Cervical / Trunk Assessment Cervical / Trunk Assessment: Normal   Balance Balance Overall balance assessment: Needs assistance Standing balance support: No upper extremity supported Standing balance-Leahy Scale: Good  End of Session PT - End of Session Equipment Utilized During Treatment: Gait belt Activity Tolerance: Patient tolerated treatment well Patient left: in bed;with call bell/phone within reach   Weston Anna, MPT Pager: [phone]

## 2013-08-20 NOTE — Progress Notes (Signed)
[facility] PROGRESS NOTE  Tyler Diaz YTK:[id number] DOB: 08/04/1945 DOA: 08/17/2013 PCP: Redge Gainer, MD  Assessment/Plan: Sepsis/Fever -Presumed from C. Diff Colitis. -UA/CXR negative. -Repeat BC negative. -Sepsis parameters have resolved.  C Diff Colitis -Continue PO vanc/flagyl x 14 days. -no diarrhea since last pm (had solid BM today).  MV Mass -?vegetation seen on 2D ECHO. -Have called cardiology for a TEE in am 3/18.  History of rectal cancer -Followed by Dr. Johney Maine. -Received radiation. -Continue OP follow up.  Code Status: Full Code Family Communication: None  Disposition Plan: For TEE in am.  Consultants:  ID  Surgery   Antibiotics:  Vanc PO  Flagyl  Subjective: No complaints other than weakness/fatigue.  Objective: Filed Vitals:   08/19/13 1430 08/19/13 2156 08/20/13 0500 08/20/13 1500  BP: 118/61 123/71 120/71 131/81  Pulse: 97 91 105 100  Temp: 98.7 F (37.1 C) 99.4 F (37.4 C) 98.3 F (36.8 C) 98.5 F (36.9 C)  TempSrc: Oral Oral Axillary Oral  Resp: 16 16 18 18   Height: 5\' 10"  (1.778 m)     Weight: 84.3 kg (185 lb 13.6 oz)     SpO2: 96% 98% 97% 100%    Intake/Output Summary (Last 24 hours) at 08/20/13 1602 Last data filed at 08/20/13 0900  Gross per 24 hour  Intake    240 ml  Output    950 ml  Net   -710 ml   Filed Weights   08/17/13 2021 08/19/13 0500 08/19/13 1430  Weight: 80.8 kg (178 lb 2.1 oz) 81.9 kg (180 lb 8.9 oz) 84.3 kg (185 lb 13.6 oz)    Exam:   General:  AA Ox3  Cardiovascular: RRR  Respiratory: CTA B  Abdomen: S/NT/ND/+BS  Extremities: trace bilateral edema   Neurologic:  Non-focal  Data Reviewed: Basic Metabolic Panel:  Recent Labs Lab 08/17/13 1700 08/18/13 0320 08/19/13 0330 08/20/13 0400  NA 135* 137 138 137  K 4.6 4.3 3.8 3.7  CL 95* 101 102 100  CO2 25 25 24 23   GLUCOSE 100* 113* 119* 99  BUN 21 18 15 13   CREATININE 1.26 1.39* 1.46* 1.15  CALCIUM 9.6 8.6 8.4 8.9    Liver Function Tests:  Recent Labs Lab 08/17/13 1700  AST 30  ALT 30  ALKPHOS 83  BILITOT 0.9  PROT 7.1  ALBUMIN 3.6   No results found for this basename: LIPASE, AMYLASE,  in the last 168 hours No results found for this basename: AMMONIA,  in the last 168 hours CBC:  Recent Labs Lab 08/17/13 1700 08/18/13 0320 08/19/13 0330 08/20/13 0400  WBC 27.1* 23.8* 16.1* 10.7*  NEUTROABS 22.4*  --   --   --   HGB 11.9* 10.2* 8.8* 9.4*  HCT 36.0* 29.8* 27.4* 27.8*  MCV 100.3* 100.0 101.5* 98.6  PLT 173 148* 149* 160   Cardiac Enzymes: No results found for this basename: CKTOTAL, CKMB, CKMBINDEX, TROPONINI,  in the last 168 hours BNP (last 3 results) No results found for this basename: PROBNP,  in the last 8760 hours CBG: No results found for this basename: GLUCAP,  in the last 168 hours  Recent Results (from the past 240 hour(s))  CULTURE, BLOOD (ROUTINE X 2)     Status: None   Collection Time    08/17/13  5:00 PM      Result Value Ref Range Status   Specimen Description BLOOD BLOOD RIGHT FOREARM   Final   Special Requests A   Final  Culture  Setup Time     Final   Value: 08/17/2013 21:33     Performed at [facility]   Culture     Final   Value:        BLOOD CULTURE RECEIVED NO GROWTH TO DATE CULTURE WILL BE HELD FOR 5 DAYS BEFORE ISSUING A FINAL NEGATIVE REPORT     Performed at [facility]   Report Status PENDING   Incomplete  CULTURE, BLOOD (ROUTINE X 2)     Status: None   Collection Time    08/17/13  5:00 PM      Result Value Ref Range Status   Specimen Description BLOOD LEFT ANTECUBITAL   Final   Special Requests BOTTLES DRAWN AEROBIC AND ANAEROBIC [facility] EACH   Final   Culture  Setup Time     Final   Value: 08/17/2013 21:33     Performed at [facility]   Culture     Final   Value:        BLOOD CULTURE RECEIVED NO GROWTH TO DATE CULTURE WILL BE HELD FOR 5 DAYS BEFORE ISSUING A FINAL NEGATIVE REPORT     Performed at [facility]   Report Status PENDING   Incomplete  URINE CULTURE     Status: None   Collection Time    08/17/13  5:52 PM      Result Value Ref Range Status   Specimen Description URINE, CATHETERIZED   Final   Special Requests NONE   Final   Culture  Setup Time     Final   Value: 08/17/2013 21:49     Performed at [facility]     Final   Value: 30,000 COLONIES/ML     Performed at [facility]   Culture     Final   Value: Multiple bacterial morphotypes present, none predominant. Suggest appropriate recollection if clinically indicated.     Performed at [facility]   Report Status 08/19/2013 FINAL   Final  MRSA PCR SCREENING     Status: None   Collection Time    08/17/13  8:40 PM      Result Value Ref Range Status   MRSA by PCR NEGATIVE  NEGATIVE Final   Comment:            The GeneXpert MRSA Assay (FDA     approved for NASAL specimens     only), is one component of a     comprehensive MRSA colonization     surveillance program. It is not     intended to diagnose MRSA     infection nor to guide or     monitor treatment for     MRSA infections.  CULTURE, BLOOD (ROUTINE X 2)     Status: None   Collection Time    08/18/13 11:45 AM      Result Value Ref Range Status   Specimen Description BLOOD RIGHT ARM   Final   Special Requests     Final   Value: BOTTLES DRAWN AEROBIC AND ANAEROBIC 10CC BOTH BOTTLES   Culture  Setup Time     Final   Value: 08/18/2013 18:16     Performed at [facility]   Culture     Final   Value:        BLOOD CULTURE RECEIVED NO GROWTH TO DATE CULTURE WILL BE HELD FOR 5 DAYS BEFORE ISSUING A FINAL NEGATIVE REPORT  Performed at [facility]   Report Status PENDING   Incomplete  CULTURE, BLOOD (ROUTINE X 2)     Status: None   Collection Time    08/18/13 12:00 PM      Result Value Ref Range Status   Specimen Description BLOOD RIGHT HAND   Final   Special Requests BOTTLES DRAWN AEROBIC AND ANAEROBIC  1CC   Final   Culture  Setup Time     Final   Value: 08/18/2013 18:17     Performed at [facility]   Culture     Final   Value:        BLOOD CULTURE RECEIVED NO GROWTH TO DATE CULTURE WILL BE HELD FOR 5 DAYS BEFORE ISSUING A FINAL NEGATIVE REPORT     Performed at [facility]   Report Status PENDING   Incomplete  CLOSTRIDIUM DIFFICILE BY PCR     Status: Abnormal   Collection Time    08/18/13 12:37 PM      Result Value Ref Range Status   C difficile by pcr POSITIVE (*) NEGATIVE Final   Comment: CRITICAL RESULT CALLED TO, READ BACK BY AND VERIFIED WITH:     BARHAM,M RN AT 1919 08/18/13 MITCHELL,L     PERFORMED AT [facility]     Performed at [facility]     Studies: No results found.  Scheduled Meds: . ferrous sulfate  325 mg Oral BID PC  . hydrocortisone  1 application Rectal BID  . multivitamin with minerals  1 tablet Oral q morning - 10a  . polyethylene glycol  17 g Oral Daily  . vancomycin  500 mg Oral 4 times per day   Continuous Infusions:    Principal Problem:   Sepsis Active Problems:   Mitral regurgitation due to partially flail posterior mitral valve leaflet   Bacterial endocarditis   C. difficile colitis    Time spent: 25 minutes. Greater than 50% of this time was spent in direct contact with the patient coordinating care.    Lelon Frohlich  [facility] Pager [phone]  If 7PM-7AM, please contact night-coverage at [email or web address], password [facility] 08/20/2013, 4:02 PM  LOS: 3 days

## 2013-08-20 NOTE — Progress Notes (Signed)
INFECTIOUS DISEASE PROGRESS NOTE  ID: Tyler Diaz is a 68 y.o. male with  Principal Problem:   Sepsis Active Problems:   Mitral regurgitation due to partially flail posterior mitral valve leaflet   Bacterial endocarditis   C. difficile colitis  Subjective: Upset  Hard 1 solid BM today.   Abtx:  Anti-infectives   Start     Dose/Rate Route Frequency Ordered Stop   08/18/13 2330  vancomycin (VANCOCIN) 50 mg/mL oral solution 500 mg     500 mg Oral 4 times per day 08/18/13 2315     08/18/13 2330  metroNIDAZOLE (FLAGYL) IVPB 500 mg     500 mg 100 mL/hr over 60 Minutes Intravenous Every 8 hours 08/18/13 2315     08/18/13 0700  vancomycin (VANCOCIN) IVPB 750 mg/150 ml premix  Status:  Discontinued     750 mg 150 mL/hr over 60 Minutes Intravenous Every 12 hours 08/17/13 2119 08/18/13 2313   08/18/13 0200  piperacillin-tazobactam (ZOSYN) IVPB 3.375 g  Status:  Discontinued     3.375 g 12.5 mL/hr over 240 Minutes Intravenous Every 8 hours 08/17/13 2119 08/18/13 2313   08/17/13 1745  vancomycin (VANCOCIN) IVPB 1000 mg/200 mL premix     1,000 mg 200 mL/hr over 60 Minutes Intravenous  Once 08/17/13 1739 08/17/13 2004   08/17/13 1745  piperacillin-tazobactam (ZOSYN) IVPB 3.375 g     3.375 g 100 mL/hr over 30 Minutes Intravenous  Once 08/17/13 1739 08/17/13 1904      Medications:  Scheduled: . ferrous sulfate  325 mg Oral BID PC  . hydrocortisone  1 application Rectal BID  . metronidazole  500 mg Intravenous Q8H  . multivitamin with minerals  1 tablet Oral q morning - 10a  . polyethylene glycol  17 g Oral Daily  . vancomycin  500 mg Oral 4 times per day    Objective: Vital signs in last 24 hours: Temp:  [98.3 F (36.8 C)-99.4 F (37.4 C)] 98.3 F (36.8 C) (03/17 0500) Pulse Rate:  [91-105] 105 (03/17 0500) Resp:  [16-18] 18 (03/17 0500) BP: (118-123)/(61-74) 120/71 mmHg (03/17 0500) SpO2:  [96 %-98 %] 97 % (03/17 0500) Weight:  [84.3 kg (185 lb 13.6 oz)] 84.3 kg (185 lb  13.6 oz) (03/16 1430)   General appearance: alert and no distress Resp: clear to auscultation bilaterally Cardio: regular rate and rhythm GI: normal findings: bowel sounds normal and soft, non-tender  Lab Results  Recent Labs  08/19/13 0330 08/20/13 0400  WBC 16.1* 10.7*  HGB 8.8* 9.4*  HCT 27.4* 27.8*  NA 138 137  K 3.8 3.7  CL 102 100  CO2 24 23  BUN 15 13  CREATININE 1.46* 1.15   Liver Panel  Recent Labs  08/17/13 1700  PROT 7.1  ALBUMIN 3.6  AST 30  ALT 30  ALKPHOS 83  BILITOT 0.9   Sedimentation Rate No results found for this basename: ESRSEDRATE,  in the last 72 hours C-Reactive Protein No results found for this basename: CRP,  in the last 72 hours  Microbiology: Recent Results (from the past 240 hour(s))  CULTURE, BLOOD (ROUTINE X 2)     Status: None   Collection Time    08/17/13  5:00 PM      Result Value Ref Range Status   Specimen Description BLOOD BLOOD RIGHT FOREARM   Final   Special Requests A   Final   Culture  Setup Time     Final   Value: 08/17/2013  21:33     Performed at [facility]     Final   Value:        BLOOD CULTURE RECEIVED NO GROWTH TO DATE CULTURE WILL BE HELD FOR 5 DAYS BEFORE ISSUING A FINAL NEGATIVE REPORT     Performed at [facility]   Report Status PENDING   Incomplete  CULTURE, BLOOD (ROUTINE X 2)     Status: None   Collection Time    08/17/13  5:00 PM      Result Value Ref Range Status   Specimen Description BLOOD LEFT ANTECUBITAL   Final   Special Requests BOTTLES DRAWN AEROBIC AND ANAEROBIC [facility] EACH   Final   Culture  Setup Time     Final   Value: 08/17/2013 21:33     Performed at [facility]   Culture     Final   Value:        BLOOD CULTURE RECEIVED NO GROWTH TO DATE CULTURE WILL BE HELD FOR 5 DAYS BEFORE ISSUING A FINAL NEGATIVE REPORT     Performed at [facility]   Report Status PENDING   Incomplete  URINE CULTURE     Status: None   Collection Time    08/17/13   5:52 PM      Result Value Ref Range Status   Specimen Description URINE, CATHETERIZED   Final   Special Requests NONE   Final   Culture  Setup Time     Final   Value: 08/17/2013 21:49     Performed at [facility]     Final   Value: 30,000 COLONIES/ML     Performed at [facility]   Culture     Final   Value: Multiple bacterial morphotypes present, none predominant. Suggest appropriate recollection if clinically indicated.     Performed at [facility]   Report Status 08/19/2013 FINAL   Final  MRSA PCR SCREENING     Status: None   Collection Time    08/17/13  8:40 PM      Result Value Ref Range Status   MRSA by PCR NEGATIVE  NEGATIVE Final   Comment:            The GeneXpert MRSA Assay (FDA     approved for NASAL specimens     only), is one component of a     comprehensive MRSA colonization     surveillance program. It is not     intended to diagnose MRSA     infection nor to guide or     monitor treatment for     MRSA infections.  CULTURE, BLOOD (ROUTINE X 2)     Status: None   Collection Time    08/18/13 11:45 AM      Result Value Ref Range Status   Specimen Description BLOOD RIGHT ARM   Final   Special Requests     Final   Value: BOTTLES DRAWN AEROBIC AND ANAEROBIC 10CC BOTH BOTTLES   Culture  Setup Time     Final   Value: 08/18/2013 18:16     Performed at [facility]   Culture     Final   Value:        BLOOD CULTURE RECEIVED NO GROWTH TO DATE CULTURE WILL BE HELD FOR 5 DAYS BEFORE ISSUING A FINAL NEGATIVE REPORT     Performed at [facility]   Report Status  PENDING   Incomplete  CULTURE, BLOOD (ROUTINE X 2)     Status: None   Collection Time    08/18/13 12:00 PM      Result Value Ref Range Status   Specimen Description BLOOD RIGHT HAND   Final   Special Requests BOTTLES DRAWN AEROBIC AND ANAEROBIC 1CC   Final   Culture  Setup Time     Final   Value: 08/18/2013 18:17     Performed at [facility]    Culture     Final   Value:        BLOOD CULTURE RECEIVED NO GROWTH TO DATE CULTURE WILL BE HELD FOR 5 DAYS BEFORE ISSUING A FINAL NEGATIVE REPORT     Performed at [facility]   Report Status PENDING   Incomplete  CLOSTRIDIUM DIFFICILE BY PCR     Status: Abnormal   Collection Time    08/18/13 12:37 PM      Result Value Ref Range Status   C difficile by pcr POSITIVE (*) NEGATIVE Final   Comment: CRITICAL RESULT CALLED TO, READ BACK BY AND VERIFIED WITH:     BARHAM,M RN AT 1919 08/18/13 MITCHELL,L     PERFORMED AT [facility]     Performed at [facility]    Studies/Results: No results found.   Assessment/Plan: C diff  Enterococcal Bacteremia (? IE) 07-05-13   Completed Amp/Gent 08-04-13  Rectal CA   TTE shows MV lesion, TEE in AM to follow this up.  Repeat BCx 3-15 are (-) still WBC continues to improve Will stop IV flagyl This is his first episode of C diff, can stop po vanco at 14 days.   Total days of antibiotics: 3 IV flagyl/po vanco   Bobby Rumpf Infectious Diseases (pager) [phone] [email or web address] 08/20/2013, 11:17 AM  LOS: 3 days

## 2013-08-20 NOTE — Evaluation (Signed)
Occupational Therapy Evaluation Patient Details Name: Tyler Diaz MRN: [id number] DOB: 12-10-1945 Today's Date: 08/20/2013 Time: 1443-1540 OT Time Calculation (min): 38 min  OT Assessment / Plan / Recommendation History of present illness Pt was admited with sepsis. He has a h/o rectal CA with reverse colostomy   Clinical Impression   Pt was dmitted for the above.  He will benefit from skilled OT to increase activity tolerance and independence with adls while in acute.  He will not need follow up OT after discharge.    OT Assessment  Patient needs continued OT Services    Follow Up Recommendations  No OT follow up    Barriers to Discharge      Equipment Recommendations  None recommended by OT (likely)    Recommendations for Other Services    Frequency  Min 2X/week    Precautions / Restrictions Precautions Precautions: Fall Restrictions Weight Bearing Restrictions: No   Pertinent Vitals/Pain No pain reported    ADL  Grooming: Shaving;Teeth care;Supervision/safety Where Assessed - Grooming: Unsupported standing Toilet Transfer: Min Psychiatric nurse Method: Sit to Loss adjuster, chartered: Comfort height toilet Toileting - Clothing Manipulation and Hygiene: Supervision/safety Where Assessed - Toileting Clothing Manipulation and Hygiene: Sit to stand from 3-in-1 or toilet Equipment Used:  (iv pole) Transfers/Ambulation Related to ADLs: ambulated to bathroom with min guard.  Pt does not use AD at home--used IV pole for support.  Slightly unsteady but no LOB when not using pole ADL Comments: Pt can complete ADLs with set up and min guard for LB adls.     OT Diagnosis: Generalized weakness  OT Problem List: Decreased strength;Impaired balance (sitting and/or standing) OT Treatment Interventions: Self-care/ADL training;DME and/or AE instruction;Patient/family education;Balance training   OT Goals(Current goals can be found in the care plan section) Acute Rehab  OT Goals Patient Stated Goal: walk; get strength back OT Goal Formulation: With patient Time For Goal Achievement: 09/03/13 Potential to Achieve Goals: Good ADL Goals Pt Will Transfer to Toilet: with modified independence;regular height toilet;ambulating Additional ADL Goal #1: pt will gather clothes at supervision level then perform adl at this level without supervision  Visit Information  Last OT Received On: 08/20/13 Assistance Needed: +1 History of Present Illness: Pt was admited with sepsis. He has a h/o rectal CA with reverse colostomy       Prior [place] expects to be discharged to:: Private residence Living Arrangements: Spouse/significant other;Children Available Help at Discharge: Family Home Equipment: Grab bars - tub/shower Additional Comments: sits at bottom of tub Prior Function Level of Independence: Independent Communication Communication: HOH (slightly)         Vision/Perception     Cognition  Cognition Arousal/Alertness: Awake/alert Behavior During Therapy: WFL for tasks assessed/performed Overall Cognitive Status: Within Functional Limits for tasks assessed    Extremity/Trunk Assessment Upper Extremity Assessment Upper Extremity Assessment: Overall WFL for tasks assessed     Mobility Bed Mobility Overal bed mobility: Independent Transfers Overall transfer level: Needs assistance Equipment used: None Transfers: Sit to/from Stand Sit to Stand: Min guard General transfer comment: initially min guard for sit to stand from bed; then supervision from commode     Exercise     Balance General Comments General comments (skin integrity, edema, etc.): pt educated about energy conservation, especially balancing activity and rest when he gets home.  He has never had to do this before but verbalizes understanding, if needed   End of Session OT -  End of Session Activity Tolerance: Patient tolerated treatment  well Patient left: in chair;with call bell/phone within reach  [place] 08/20/2013, 9:08 AM Lesle Chris, OTR/L [phone] 08/20/2013

## 2013-08-21 ENCOUNTER — Encounter (HOSPITAL_COMMUNITY)
Admission: EM | Disposition: A | Discharge status: Home / Self Care | Payer: Self-pay | Source: Home / Self Care | Attending: Internal Medicine

## 2013-08-21 HISTORY — PX: TEE WITHOUT CARDIOVERSION: SHX5443

## 2013-08-21 LAB — BASIC METABOLIC PANEL
BUN: 13 mg/dL (ref 6–23)
CALCIUM: 9.2 mg/dL (ref 8.4–10.5)
CO2: 24 meq/L (ref 19–32)
Chloride: 99 mEq/L (ref 96–112)
Creatinine, Ser: 1.1 mg/dL (ref 0.50–1.35)
GFR calc Af Amer: 77 mL/min — ABNORMAL LOW (ref >90)
GFR, EST NON AFRICAN AMERICAN: 67 mL/min — AB (ref >90)
Glucose, Bld: 100 mg/dL — ABNORMAL HIGH (ref 70–99)
POTASSIUM: 3.6 meq/L — AB (ref 3.7–5.3)
SODIUM: 137 meq/L (ref 137–147)

## 2013-08-21 LAB — CBC
HCT: 30.8 % — ABNORMAL LOW (ref 39.0–52.0)
Hemoglobin: 10 g/dL — ABNORMAL LOW (ref 13.0–17.0)
MCH: 32.2 pg (ref 26.0–34.0)
MCHC: 32.5 g/dL (ref 30.0–36.0)
MCV: 99 fL (ref 78.0–100.0)
PLATELETS: 183 10*3/uL (ref 150–400)
RBC: 3.11 MIL/uL — AB (ref 4.22–5.81)
RDW: 13.3 % (ref 11.5–15.5)
WBC: 9.2 10*3/uL (ref 4.0–10.5)

## 2013-08-21 SURGERY — ECHOCARDIOGRAM, TRANSESOPHAGEAL
Anesthesia: Moderate Sedation

## 2013-08-21 MED ORDER — MENTHOL 3 MG MT LOZG
1.0000 | LOZENGE | OROMUCOSAL | Status: DC | PRN
  Administered 2013-08-21: 3 mg via ORAL
  Filled 2013-08-21: qty 9

## 2013-08-21 MED ORDER — OXYCODONE HCL 5 MG PO TABS
10.0000 mg | ORAL_TABLET | ORAL | Status: DC | PRN
  Administered 2013-08-21 – 2013-08-22 (×5): 10 mg via ORAL
  Filled 2013-08-21 (×4): qty 2

## 2013-08-21 MED ORDER — MIDAZOLAM HCL 10 MG/2ML IJ SOLN
INTRAMUSCULAR | Status: DC | PRN
  Administered 2013-08-21: 1 mg via INTRAVENOUS
  Administered 2013-08-21: 2 mg via INTRAVENOUS
  Administered 2013-08-21: 1 mg via INTRAVENOUS

## 2013-08-21 MED ORDER — FENTANYL CITRATE 0.05 MG/ML IJ SOLN
INTRAMUSCULAR | Status: DC | PRN
  Administered 2013-08-21: 25 ug via INTRAVENOUS

## 2013-08-21 MED ORDER — FLUTICASONE PROPIONATE 50 MCG/ACT NA SUSP
1.0000 | Freq: Every day | NASAL | Status: DC
  Administered 2013-08-21 – 2013-08-22 (×2): 1 via NASAL
  Filled 2013-08-21: qty 16

## 2013-08-21 MED ORDER — PHENOL 1.4 % MT LIQD
1.0000 | OROMUCOSAL | Status: DC | PRN
  Filled 2013-08-21: qty 177

## 2013-08-21 MED ORDER — SODIUM CHLORIDE 0.9 % IV SOLN
INTRAVENOUS | Status: DC
  Administered 2013-08-21: 14:00:00 via INTRAVENOUS

## 2013-08-21 MED ORDER — BUTAMBEN-TETRACAINE-BENZOCAINE 2-2-14 % EX AERO
INHALATION_SPRAY | CUTANEOUS | Status: DC | PRN
  Administered 2013-08-21: 2 via TOPICAL

## 2013-08-21 NOTE — Progress Notes (Signed)
PT Cancellation Note  Patient Details Name: Tyler Diaz MRN: [id number] DOB: Jun 01, 1946   Cancelled Treatment:    Reason Eval/Treat Not Completed: Patient at procedure or test/unavailable-at [facility]. Will check back on tomorrow.    Weston Anna, MPT Pager: [phone]

## 2013-08-21 NOTE — Op Note (Signed)
INDICATIONS: infective endocarditis  PROCEDURE:   Informed consent was obtained prior to the procedure. The risks, benefits and alternatives for the procedure were discussed and the patient comprehended these risks.  Risks include, but are not limited to, cough, sore throat, vomiting, nausea, somnolence, esophageal and stomach trauma or perforation, bleeding, low blood pressure, aspiration, pneumonia, infection, trauma to the teeth and death.    After a procedural time-out, the oropharynx was anesthetized with 20% benzocaine spray. The patient was given 4 mg versed and 25 mcg fentanyl for moderate sedation.   The transesophageal probe was inserted in the esophagus and stomach without difficulty and multiple views were obtained.  The patient was kept under observation until the patient left the procedure room.  The patient left the procedure room in stable condition.   Agitated microbubble saline contrast was not administered.  COMPLICATIONS:    There were no immediate complications.  FINDINGS:  Small flail segment of the middle scallop of the posterior mitral Moderate to severe MR, highly eccentric.  RECOMMENDATIONS:   No clear evidence of vegetation - need too compare directly to old study.   Time Spent Directly with the Patient:  45 minutes   Anwyn Kriegel 08/21/2013, 2:51 PM

## 2013-08-21 NOTE — Progress Notes (Signed)
INFECTIOUS DISEASE PROGRESS NOTE  ID: Tyler Diaz is a 68 y.o. male with  Principal Problem:   Sepsis Active Problems:   Mitral regurgitation due to partially flail posterior mitral valve leaflet   Bacterial endocarditis   C. difficile colitis  Subjective: No BM today.   Abtx:  Anti-infectives   Start     Dose/Rate Route Frequency Ordered Stop   08/18/13 2330  vancomycin (VANCOCIN) 50 mg/mL oral solution 500 mg     500 mg Oral 4 times per day 08/18/13 2315     08/18/13 2330  metroNIDAZOLE (FLAGYL) IVPB 500 mg  Status:  Discontinued     500 mg 100 mL/hr over 60 Minutes Intravenous Every 8 hours 08/18/13 2315 08/20/13 1128   08/18/13 0700  vancomycin (VANCOCIN) IVPB 750 mg/150 ml premix  Status:  Discontinued     750 mg 150 mL/hr over 60 Minutes Intravenous Every 12 hours 08/17/13 2119 08/18/13 2313   08/18/13 0200  piperacillin-tazobactam (ZOSYN) IVPB 3.375 g  Status:  Discontinued     3.375 g 12.5 mL/hr over 240 Minutes Intravenous Every 8 hours 08/17/13 2119 08/18/13 2313   08/17/13 1745  vancomycin (VANCOCIN) IVPB 1000 mg/200 mL premix     1,000 mg 200 mL/hr over 60 Minutes Intravenous  Once 08/17/13 1739 08/17/13 2004   08/17/13 1745  piperacillin-tazobactam (ZOSYN) IVPB 3.375 g     3.375 g 100 mL/hr over 30 Minutes Intravenous  Once 08/17/13 1739 08/17/13 1904      Medications:  Scheduled: . ferrous sulfate  325 mg Oral BID PC  . fluticasone  1 spray Each Nare Daily  . hydrocortisone  1 application Rectal BID  . multivitamin with minerals  1 tablet Oral q morning - 10a  . polyethylene glycol  17 g Oral Daily  . vancomycin  500 mg Oral 4 times per day    Objective: Vital signs in last 24 hours: Temp:  [98.1 F (36.7 C)-99 F (37.2 C)] 98.1 F (36.7 C) (03/18 1522) Pulse Rate:  [83-99] 83 (03/18 1620) Resp:  [9-22] 11 (03/18 1620) BP: (94-142)/(40-80) 119/80 mmHg (03/18 1620) SpO2:  [92 %-100 %] 97 % (03/18 1620)   General appearance: alert,  cooperative and no distress Resp: clear to auscultation bilaterally Cardio: regular rate and rhythm and systolic murmur: early systolic 3/6, crescendo at 2nd left intercostal space GI: normal findings: bowel sounds normal and soft, non-tender  Lab Results  Recent Labs  08/20/13 0400 08/21/13 0415  WBC 10.7* 9.2  HGB 9.4* 10.0*  HCT 27.8* 30.8*  NA 137 137  K 3.7 3.6*  CL 100 99  CO2 23 24  BUN 13 13  CREATININE 1.15 1.10   Liver Panel No results found for this basename: PROT, ALBUMIN, AST, ALT, ALKPHOS, BILITOT, BILIDIR, IBILI,  in the last 72 hours Sedimentation Rate No results found for this basename: ESRSEDRATE,  in the last 72 hours C-Reactive Protein No results found for this basename: CRP,  in the last 72 hours  Microbiology: Recent Results (from the past 240 hour(s))  CULTURE, BLOOD (ROUTINE X 2)     Status: None   Collection Time    08/17/13  5:00 PM      Result Value Ref Range Status   Specimen Description BLOOD BLOOD RIGHT FOREARM   Final   Special Requests A   Final   Culture  Setup Time     Final   Value: 08/17/2013 21:33     Performed at [facility]   Culture     Final   Value:        BLOOD CULTURE RECEIVED NO GROWTH TO DATE CULTURE WILL BE HELD FOR 5 DAYS BEFORE ISSUING A FINAL NEGATIVE REPORT     Performed at [facility]   Report Status PENDING   Incomplete  CULTURE, BLOOD (ROUTINE X 2)     Status: None   Collection Time    08/17/13  5:00 PM      Result Value Ref Range Status   Specimen Description BLOOD LEFT ANTECUBITAL   Final   Special Requests BOTTLES DRAWN AEROBIC AND ANAEROBIC [facility] EACH   Final   Culture  Setup Time     Final   Value: 08/17/2013 21:33     Performed at [facility]   Culture     Final   Value:        BLOOD CULTURE RECEIVED NO GROWTH TO DATE CULTURE WILL BE HELD FOR 5 DAYS BEFORE ISSUING A FINAL NEGATIVE REPORT     Performed at [facility]   Report Status PENDING   Incomplete  URINE CULTURE      Status: None   Collection Time    08/17/13  5:52 PM      Result Value Ref Range Status   Specimen Description URINE, CATHETERIZED   Final   Special Requests NONE   Final   Culture  Setup Time     Final   Value: 08/17/2013 21:49     Performed at [facility]     Final   Value: 30,000 COLONIES/ML     Performed at [facility]   Culture     Final   Value: Multiple bacterial morphotypes present, none predominant. Suggest appropriate recollection if clinically indicated.     Performed at [facility]   Report Status 08/19/2013 FINAL   Final  MRSA PCR SCREENING     Status: None   Collection Time    08/17/13  8:40 PM      Result Value Ref Range Status   MRSA by PCR NEGATIVE  NEGATIVE Final   Comment:            The GeneXpert MRSA Assay (FDA     approved for NASAL specimens     only), is one component of a     comprehensive MRSA colonization     surveillance program. It is not     intended to diagnose MRSA     infection nor to guide or     monitor treatment for     MRSA infections.  CULTURE, BLOOD (ROUTINE X 2)     Status: None   Collection Time    08/18/13 11:45 AM      Result Value Ref Range Status   Specimen Description BLOOD RIGHT ARM   Final   Special Requests     Final   Value: BOTTLES DRAWN AEROBIC AND ANAEROBIC 10CC BOTH BOTTLES   Culture  Setup Time     Final   Value: 08/18/2013 18:16     Performed at [facility]   Culture     Final   Value:        BLOOD CULTURE RECEIVED NO GROWTH TO DATE CULTURE WILL BE HELD FOR 5 DAYS BEFORE ISSUING A FINAL NEGATIVE REPORT     Performed at [facility]   Report Status PENDING   Incomplete  CULTURE, BLOOD (ROUTINE  X 2)     Status: None   Collection Time    08/18/13 12:00 PM      Result Value Ref Range Status   Specimen Description BLOOD RIGHT HAND   Final   Special Requests BOTTLES DRAWN AEROBIC AND ANAEROBIC 1CC   Final   Culture  Setup Time     Final   Value: 08/18/2013  18:17     Performed at [facility]   Culture     Final   Value:        BLOOD CULTURE RECEIVED NO GROWTH TO DATE CULTURE WILL BE HELD FOR 5 DAYS BEFORE ISSUING A FINAL NEGATIVE REPORT     Performed at [facility]   Report Status PENDING   Incomplete  CLOSTRIDIUM DIFFICILE BY PCR     Status: Abnormal   Collection Time    08/18/13 12:37 PM      Result Value Ref Range Status   C difficile by pcr POSITIVE (*) NEGATIVE Final   Comment: CRITICAL RESULT CALLED TO, READ BACK BY AND VERIFIED WITH:     BARHAM,M RN AT 1919 08/18/13 MITCHELL,L     PERFORMED AT [facility]     Performed at [facility]    Studies/Results: No results found.   Assessment/Plan: C diff  Enterococcal Bacteremia (? IE) 07-05-13   Completed Amp/Gent 08-04-13  Rectal CA   Total days of antibiotics: 4 po vanco  TEE repeated today. No evidence of IE.  CV to review, compare to old TEE. Greatly appreciate their eval.  His repeat BCx are ngtd.  Plan for 14 days of vanco         Bobby Rumpf Infectious Diseases (pager) [phone] [email or web address] 08/21/2013, 5:20 PM  LOS: 4 days

## 2013-08-21 NOTE — Progress Notes (Signed)
[facility] PROGRESS NOTE  Tyler Diaz SEG:[id number] DOB: 10-24-1945 DOA: 08/17/2013 PCP: Redge Gainer, MD  Assessment/Plan: 68 y/o male with PMH of MR 2/2 partially flail posterior mitral valve leaflet, rectal CA s/p resection/neoadjuvant xeloda and radiation, recent bacteremia with IE S enterococcus bacteremia completed IV atx is admitted with c diff   1. Sepsis/Fever/Presumed from C. Diff Colitis. UA/CXR negative. Repeat BC negative.  -Continue PO vanc/flagyl x 14 days.  -no diarrhea since last pm (had solid BM) 2. MV Mass recent Enterococcal Bacteremia (? IE) 07-05-13  Completed Amp/Gent 08-04-13  -?vegetation seen on 2D ECHO.  -awaiting TEE in am 3/18.  3. History of rectal cancer; s/p resection/neoadjuvant xeloda and radiation; Followed by Dr. Johney Maine.  4. Anemia; no s/s of acute bleeding; cont PO iron     Code Status: full Family Communication: d/w patient, his  (indicate person spoken with, relationship, and if by phone, the number) Disposition Plan: home 2-3 days   Consultants:  ID  Surgery  Antibiotics:  Vanc PO  Flagyl  HPI/Subjective: alert  Objective: Filed Vitals:   08/21/13 0636  BP: 119/68  Pulse: 95  Temp: 98.8 F (37.1 C)  Resp: 16    Intake/Output Summary (Last 24 hours) at 08/21/13 1028 Last data filed at 08/20/13 2300  Gross per 24 hour  Intake    120 ml  Output    300 ml  Net   -180 ml   Filed Weights   08/17/13 2021 08/19/13 0500 08/19/13 1430  Weight: 80.8 kg (178 lb 2.1 oz) 81.9 kg (180 lb 8.9 oz) 84.3 kg (185 lb 13.6 oz)    Exam:   General:  alert  Cardiovascular: s1,s2 rrr  Respiratory: CTA BL  Abdomen: soft, nt,nd   Musculoskeletal: no LEedema   Data Reviewed: Basic Metabolic Panel:  Recent Labs Lab 08/17/13 1700 08/18/13 0320 08/19/13 0330 08/20/13 0400 08/21/13 0415  NA 135* 137 138 137 137  K 4.6 4.3 3.8 3.7 3.6*  CL 95* 101 102 100 99  CO2 25 25 24 23 24   GLUCOSE 100* 113* 119* 99 100*  BUN 21 18 15  13 13   CREATININE 1.26 1.39* 1.46* 1.15 1.10  CALCIUM 9.6 8.6 8.4 8.9 9.2   Liver Function Tests:  Recent Labs Lab 08/17/13 1700  AST 30  ALT 30  ALKPHOS 83  BILITOT 0.9  PROT 7.1  ALBUMIN 3.6   No results found for this basename: LIPASE, AMYLASE,  in the last 168 hours No results found for this basename: AMMONIA,  in the last 168 hours CBC:  Recent Labs Lab 08/17/13 1700 08/18/13 0320 08/19/13 0330 08/20/13 0400 08/21/13 0415  WBC 27.1* 23.8* 16.1* 10.7* 9.2  NEUTROABS 22.4*  --   --   --   --   HGB 11.9* 10.2* 8.8* 9.4* 10.0*  HCT 36.0* 29.8* 27.4* 27.8* 30.8*  MCV 100.3* 100.0 101.5* 98.6 99.0  PLT 173 148* 149* 160 183   Cardiac Enzymes: No results found for this basename: CKTOTAL, CKMB, CKMBINDEX, TROPONINI,  in the last 168 hours BNP (last 3 results) No results found for this basename: PROBNP,  in the last 8760 hours CBG: No results found for this basename: GLUCAP,  in the last 168 hours  Recent Results (from the past 240 hour(s))  CULTURE, BLOOD (ROUTINE X 2)     Status: None   Collection Time    08/17/13  5:00 PM      Result Value Ref Range Status   Specimen  Description BLOOD BLOOD RIGHT FOREARM   Final   Special Requests A   Final   Culture  Setup Time     Final   Value: 08/17/2013 21:33     Performed at [facility]   Culture     Final   Value:        BLOOD CULTURE RECEIVED NO GROWTH TO DATE CULTURE WILL BE HELD FOR 5 DAYS BEFORE ISSUING A FINAL NEGATIVE REPORT     Performed at [facility]   Report Status PENDING   Incomplete  CULTURE, BLOOD (ROUTINE X 2)     Status: None   Collection Time    08/17/13  5:00 PM      Result Value Ref Range Status   Specimen Description BLOOD LEFT ANTECUBITAL   Final   Special Requests BOTTLES DRAWN AEROBIC AND ANAEROBIC [facility] EACH   Final   Culture  Setup Time     Final   Value: 08/17/2013 21:33     Performed at [facility]   Culture     Final   Value:        BLOOD CULTURE RECEIVED NO  GROWTH TO DATE CULTURE WILL BE HELD FOR 5 DAYS BEFORE ISSUING A FINAL NEGATIVE REPORT     Performed at [facility]   Report Status PENDING   Incomplete  URINE CULTURE     Status: None   Collection Time    08/17/13  5:52 PM      Result Value Ref Range Status   Specimen Description URINE, CATHETERIZED   Final   Special Requests NONE   Final   Culture  Setup Time     Final   Value: 08/17/2013 21:49     Performed at [facility] Count     Final   Value: 30,000 COLONIES/ML     Performed at [facility]   Culture     Final   Value: Multiple bacterial morphotypes present, none predominant. Suggest appropriate recollection if clinically indicated.     Performed at [facility]   Report Status 08/19/2013 FINAL   Final  MRSA PCR SCREENING     Status: None   Collection Time    08/17/13  8:40 PM      Result Value Ref Range Status   MRSA by PCR NEGATIVE  NEGATIVE Final   Comment:            The GeneXpert MRSA Assay (FDA     approved for NASAL specimens     only), is one component of a     comprehensive MRSA colonization     surveillance program. It is not     intended to diagnose MRSA     infection nor to guide or     monitor treatment for     MRSA infections.  CULTURE, BLOOD (ROUTINE X 2)     Status: None   Collection Time    08/18/13 11:45 AM      Result Value Ref Range Status   Specimen Description BLOOD RIGHT ARM   Final   Special Requests     Final   Value: BOTTLES DRAWN AEROBIC AND ANAEROBIC 10CC BOTH BOTTLES   Culture  Setup Time     Final   Value: 08/18/2013 18:16     Performed at [facility]   Culture     Final   Value:        BLOOD CULTURE  RECEIVED NO GROWTH TO DATE CULTURE WILL BE HELD FOR 5 DAYS BEFORE ISSUING A FINAL NEGATIVE REPORT     Performed at [facility]   Report Status PENDING   Incomplete  CULTURE, BLOOD (ROUTINE X 2)     Status: None   Collection Time    08/18/13 12:00 PM      Result Value Ref  Range Status   Specimen Description BLOOD RIGHT HAND   Final   Special Requests BOTTLES DRAWN AEROBIC AND ANAEROBIC 1CC   Final   Culture  Setup Time     Final   Value: 08/18/2013 18:17     Performed at [facility]   Culture     Final   Value:        BLOOD CULTURE RECEIVED NO GROWTH TO DATE CULTURE WILL BE HELD FOR 5 DAYS BEFORE ISSUING A FINAL NEGATIVE REPORT     Performed at [facility]   Report Status PENDING   Incomplete  CLOSTRIDIUM DIFFICILE BY PCR     Status: Abnormal   Collection Time    08/18/13 12:37 PM      Result Value Ref Range Status   C difficile by pcr POSITIVE (*) NEGATIVE Final   Comment: CRITICAL RESULT CALLED TO, READ BACK BY AND VERIFIED WITH:     BARHAM,M RN AT 1919 08/18/13 MITCHELL,L     PERFORMED AT [facility]     Performed at [facility]     Studies: No results found.  Scheduled Meds: . ferrous sulfate  325 mg Oral BID PC  . hydrocortisone  1 application Rectal BID  . multivitamin with minerals  1 tablet Oral q morning - 10a  . polyethylene glycol  17 g Oral Daily  . vancomycin  500 mg Oral 4 times per day   Continuous Infusions:   Principal Problem:   Sepsis Active Problems:   Mitral regurgitation due to partially flail posterior mitral valve leaflet   Bacterial endocarditis   C. difficile colitis    Time spent: >35 minutes     Kinnie Feil  [facility] Pager [phone]. If 7PM-7AM, please contact night-coverage at [email or web address], password [facility] 08/21/2013, 10:28 AM  LOS: 4 days

## 2013-08-21 NOTE — Interval H&P Note (Signed)
History and Physical Interval Note:  08/21/2013 2:46 PM  Tyler Diaz  has presented today for surgery, with the diagnosis of MASS ON MITRAL VALVE  The various methods of treatment have been discussed with the patient and family. After consideration of risks, benefits and other options for treatment, the patient has consented to  Procedure(s): TRANSESOPHAGEAL ECHOCARDIOGRAM (TEE) (N/A) as a surgical intervention .  The patient's history has been reviewed, patient examined, no change in status, stable for surgery.  I have reviewed the patient's chart and labs.  Questions were answered to the patient's satisfaction.     Eldonna Neuenfeldt

## 2013-08-21 NOTE — Progress Notes (Signed)
  Echocardiogram Echocardiogram Transesophageal has been performed.  Mauricio Po 08/21/2013, 4:08 PM

## 2013-08-21 NOTE — Care Management Note (Signed)
   CARE MANAGEMENT NOTE 08/21/2013  Patient:  Tyler Diaz, Tyler Diaz   Account Number:  [id number]  Date Initiated:  08/21/2013  Documentation initiated by:  Hikari Tripp  Subjective/Objective Assessment:   68 yo male admitted with sepsis. PCP: Redge Gainer, MD     Action/Plan:   Home when stable   Anticipated DC Date:     Anticipated DC Plan:  [place]  CM consult      Choice offered to / List presented to:  NA   DME arranged  NA      DME agency  NA     [place] arranged  NA      [place] agency  NA   Status of service:  Completed, signed off Medicare Important Message given?   (If response is "NO", the following Medicare IM given date fields will be blank) Date Medicare IM given:   Date Additional Medicare IM given:    Discharge Disposition:    Per UR Regulation:  Reviewed for med. necessity/level of care/duration of stay  If discussed at [place] of Stay Meetings, dates discussed:    Comments:  08/21/13 [place] [phone] Chart reviewed for utilization of services. Pt eval recommends no PT follow up. Identified PCP and pharmacy.

## 2013-08-22 ENCOUNTER — Encounter (HOSPITAL_COMMUNITY): Payer: Self-pay | Admitting: Cardiovascular Disease

## 2013-08-22 MED ORDER — SACCHAROMYCES BOULARDII 250 MG PO CAPS: 250.0000 mg | ORAL_CAPSULE | Freq: Two times a day (BID) | ORAL | Status: DC

## 2013-08-22 MED ORDER — OXYCODONE HCL 5 MG PO TABS: 5.0000 mg | ORAL_TABLET | Freq: Four times a day (QID) | ORAL | Status: DC | PRN

## 2013-08-22 MED ORDER — VANCOMYCIN 50 MG/ML ORAL SOLUTION: 500.0000 mg | Freq: Four times a day (QID) | ORAL | Status: DC

## 2013-08-22 MED ORDER — SACCHAROMYCES BOULARDII 250 MG PO CAPS
250.0000 mg | ORAL_CAPSULE | Freq: Two times a day (BID) | ORAL | Status: DC
  Administered 2013-08-22: 250 mg via ORAL
  Filled 2013-08-22 (×2): qty 1

## 2013-08-22 MED ORDER — VANCOMYCIN HCL 250 MG PO CAPS: 500.0000 mg | ORAL_CAPSULE | Freq: Four times a day (QID) | ORAL | Status: DC

## 2013-08-22 NOTE — Discharge Summary (Addendum)
Physician Discharge Summary  IBRAHEEM VORIS UVO:[id number] DOB: December 09, 1945 DOA: 08/17/2013  PCP: Redge Gainer, MD  Admit date: 08/17/2013 Discharge date: 08/22/2013  Time spent: >35 minutes  Recommendations for Outpatient Follow-up:  F/u with PCP in 1-2 weeks as needed F/u with ID in 2 weeks Discharge Diagnoses:  Principal Problem:   Sepsis Active Problems:   Mitral regurgitation due to partially flail posterior mitral valve leaflet   Bacterial endocarditis   C. difficile colitis   Discharge Condition: stable   Diet recommendation: heart healthy   Filed Weights   08/17/13 2021 08/19/13 0500 08/19/13 1430  Weight: 80.8 kg (178 lb 2.1 oz) 81.9 kg (180 lb 8.9 oz) 84.3 kg (185 lb 13.6 oz)    History of present illness:  68 y/o male with PMH of MR 2/2 partially flail posterior mitral valve leaflet, rectal CA s/p resection/neoadjuvant xeloda and radiation, recent bacteremia with IE S enterococcus bacteremia completed IV atx is admitted with c diff   Hospital Course:  1. Sepsis/Fever/Presumed from C. Diff Colitis. UA/CXR negative. Repeat BC negative.  -improved on PO vanc; per ID recommended  x 14 days.  -no diarrhea since last pm (had solid BM)  2. MV Mass recent Enterococcal Bacteremia (? IE) 07-05-13  -Pt has completed Amp/Gent 08-04-13  -?vegetation seen on 2D ECHO (3/15); however repeat TEE (3/18): no vegetation .  3. History of rectal cancer; s/p resection/neoadjuvant xeloda and radiation; Followed by Dr. Johney Maine.  4. Anemia; no s/s of acute bleeding; cont PO iron      Procedures:  TEE (i.e. Studies not automatically included, echos, thoracentesis, etc; not x-rays)  Consultations:  ID  Discharge Exam: Filed Vitals:   08/22/13 0640  BP: 126/61  Pulse: 103  Temp: 98.6 F (37 C)  Resp: 20    General: alert Cardiovascular: s1,s2 rrr Respiratory: CTA BL  Discharge Instructions  Discharge Orders   Future Appointments Provider Department Dept Phone   09/19/2013  10:45 AM Adin Hector, MD [facility] [place] [phone]   10/17/2013 9:30 AM [facility] [phone]   10/17/2013 10:00 AM [name], MD [facility] [phone]   Future Orders Complete By Expires   Diet - low sodium heart healthy  As directed    Discharge instructions  As directed    Comments:     Please follow up with infections disease specialist in 1-2 weeks   Increase activity slowly  As directed        Medication List    STOP taking these medications       acetaminophen-codeine 300-30 MG per tablet  Commonly known as:  TYLENOL #3     dextrose 5 % SOLN 50 mL with gentamicin 40 MG/ML SOLN     diazepam 2 MG tablet  Commonly known as:  VALIUM     diphenoxylate-atropine 2.5-0.025 MG per tablet  Commonly known as:  LOMOTIL     enoxaparin 120 MG/0.8ML injection  Commonly known as:  LOVENOX     polyethylene glycol packet  Commonly known as:  MIRALAX / GLYCOLAX     sodium chloride 0.9 % SOLN 50 mL with ampicillin 2 G SOLR 2 g      TAKE these medications       acetaminophen 500 MG tablet  Commonly known as:  TYLENOL  Take 500 mg by mouth every 6 (six) hours as needed for headache.     ferrous sulfate 325 (65  FE) MG tablet  Take 1 tablet (325 mg total) by mouth 2 (two) times daily with a meal.     hydrocortisone 2.5 % rectal cream  Commonly known as:  PROCTOSOL HC  Place 1 application rectally 2 (two) times daily.     LORazepam 0.5 MG tablet  Commonly known as:  ATIVAN  Take 1-2 tablets (0.5-1 mg total) by mouth at bedtime as needed for anxiety or sleep.     multivitamin with minerals Tabs tablet  Take 1 tablet by mouth every morning.     oxyCODONE 5 MG immediate release tablet  Commonly known as:  Oxy IR/ROXICODONE  Take 1 tablet (5 mg total) by mouth every 6 (six) hours as needed for moderate pain, severe pain or breakthrough pain.     oxymetazoline 0.05 % nasal  spray  Commonly known as:  AFRIN  Place 1 spray into both nostrils 2 (two) times daily as needed for congestion.     promethazine 25 MG tablet  Commonly known as:  PHENERGAN  Take 25 mg by mouth every 6 (six) hours as needed for nausea.     saccharomyces boulardii 250 MG capsule  Commonly known as:  FLORASTOR  Take 1 capsule (250 mg total) by mouth 2 (two) times daily.     vancomycin 50 mg/mL oral solution  Commonly known as:  VANCOCIN  Take 10 mLs (500 mg total) by mouth every 6 (six) hours.       Allergies  Allergen Reactions  . Shrimp [Shellfish Allergy] Hives, Nausea And Vomiting and Rash       Follow-up Information   Follow up with Redge Gainer, MD In 1 week.   Specialty:  Family Medicine   Contact information:   [address] [place] [address] [phone]        The results of significant diagnostics from this hospitalization (including imaging, microbiology, ancillary and laboratory) are listed below for reference.    Significant Diagnostic Studies: Dg Chest 2 View  08/17/2013   CLINICAL DATA:  Fever. History of colon and rectal cancer. Ex-smoker.  EXAM: CHEST  2 VIEW  COMPARISON:  07/05/2013 and chest CT dated 01/23/2013.  FINDINGS: The cardiac silhouette is borderline enlarged and the aorta is tortuous. Increased prominence of the pulmonary vasculature. No definite discrete lung nodules are visible. Mild thoracic spine degenerative changes.  IMPRESSION: Interval borderline cardiomegaly and mild pulmonary vascular congestion.   Electronically Signed   By: Enrique Sack M.D.   On: 08/17/2013 17:28    Microbiology: Recent Results (from the past 240 hour(s))  CULTURE, BLOOD (ROUTINE X 2)     Status: None   Collection Time    08/17/13  5:00 PM      Result Value Ref Range Status   Specimen Description BLOOD BLOOD RIGHT FOREARM   Final   Special Requests A   Final   Culture  Setup Time     Final   Value: 08/17/2013 21:33     Performed at [facility]   Culture     Final   Value:        BLOOD CULTURE RECEIVED NO GROWTH TO DATE CULTURE WILL BE HELD FOR 5 DAYS BEFORE ISSUING A FINAL NEGATIVE REPORT     Performed at [facility]   Report Status PENDING   Incomplete  CULTURE, BLOOD (ROUTINE X 2)     Status: None   Collection Time    08/17/13  5:00 PM  Result Value Ref Range Status   Specimen Description BLOOD LEFT ANTECUBITAL   Final   Special Requests BOTTLES DRAWN AEROBIC AND ANAEROBIC [facility] EACH   Final   Culture  Setup Time     Final   Value: 08/17/2013 21:33     Performed at [facility]   Culture     Final   Value:        BLOOD CULTURE RECEIVED NO GROWTH TO DATE CULTURE WILL BE HELD FOR 5 DAYS BEFORE ISSUING A FINAL NEGATIVE REPORT     Performed at [facility]   Report Status PENDING   Incomplete  URINE CULTURE     Status: None   Collection Time    08/17/13  5:52 PM      Result Value Ref Range Status   Specimen Description URINE, CATHETERIZED   Final   Special Requests NONE   Final   Culture  Setup Time     Final   Value: 08/17/2013 21:49     Performed at [facility]     Final   Value: 30,000 COLONIES/ML     Performed at [facility]   Culture     Final   Value: Multiple bacterial morphotypes present, none predominant. Suggest appropriate recollection if clinically indicated.     Performed at [facility]   Report Status 08/19/2013 FINAL   Final  MRSA PCR SCREENING     Status: None   Collection Time    08/17/13  8:40 PM      Result Value Ref Range Status   MRSA by PCR NEGATIVE  NEGATIVE Final   Comment:            The GeneXpert MRSA Assay (FDA     approved for NASAL specimens     only), is one component of a     comprehensive MRSA colonization     surveillance program. It is not     intended to diagnose MRSA     infection nor to guide or     monitor treatment for     MRSA infections.  CULTURE, BLOOD (ROUTINE X 2)     Status: None    Collection Time    08/18/13 11:45 AM      Result Value Ref Range Status   Specimen Description BLOOD RIGHT ARM   Final   Special Requests     Final   Value: BOTTLES DRAWN AEROBIC AND ANAEROBIC 10CC BOTH BOTTLES   Culture  Setup Time     Final   Value: 08/18/2013 18:16     Performed at [facility]   Culture     Final   Value:        BLOOD CULTURE RECEIVED NO GROWTH TO DATE CULTURE WILL BE HELD FOR 5 DAYS BEFORE ISSUING A FINAL NEGATIVE REPORT     Performed at [facility]   Report Status PENDING   Incomplete  CULTURE, BLOOD (ROUTINE X 2)     Status: None   Collection Time    08/18/13 12:00 PM      Result Value Ref Range Status   Specimen Description BLOOD RIGHT HAND   Final   Special Requests BOTTLES DRAWN AEROBIC AND ANAEROBIC 1CC   Final   Culture  Setup Time     Final   Value: 08/18/2013 18:17     Performed at [place]     Final  Value:        BLOOD CULTURE RECEIVED NO GROWTH TO DATE CULTURE WILL BE HELD FOR 5 DAYS BEFORE ISSUING A FINAL NEGATIVE REPORT     Performed at [facility]   Report Status PENDING   Incomplete  CLOSTRIDIUM DIFFICILE BY PCR     Status: Abnormal   Collection Time    08/18/13 12:37 PM      Result Value Ref Range Status   C difficile by pcr POSITIVE (*) NEGATIVE Final   Comment: CRITICAL RESULT CALLED TO, READ BACK BY AND VERIFIED WITH:     BARHAM,M RN AT 1919 08/18/13 MITCHELL,L     PERFORMED AT [facility]     Performed at [facility]: Basic Metabolic Panel:  Recent Labs Lab 08/17/13 1700 08/18/13 0320 08/19/13 0330 08/20/13 0400 08/21/13 0415  NA 135* 137 138 137 137  K 4.6 4.3 3.8 3.7 3.6*  CL 95* 101 102 100 99  CO2 25 25 24 23 24   GLUCOSE 100* 113* 119* 99 100*  BUN 21 18 15 13 13   CREATININE 1.26 1.39* 1.46* 1.15 1.10  CALCIUM 9.6 8.6 8.4 8.9 9.2   Liver Function Tests:  Recent Labs Lab 08/17/13 1700  AST 30  ALT 30  ALKPHOS 83  BILITOT 0.9  PROT 7.1   ALBUMIN 3.6   No results found for this basename: LIPASE, AMYLASE,  in the last 168 hours No results found for this basename: AMMONIA,  in the last 168 hours CBC:  Recent Labs Lab 08/17/13 1700 08/18/13 0320 08/19/13 0330 08/20/13 0400 08/21/13 0415  WBC 27.1* 23.8* 16.1* 10.7* 9.2  NEUTROABS 22.4*  --   --   --   --   HGB 11.9* 10.2* 8.8* 9.4* 10.0*  HCT 36.0* 29.8* 27.4* 27.8* 30.8*  MCV 100.3* 100.0 101.5* 98.6 99.0  PLT 173 148* 149* 160 183   Cardiac Enzymes: No results found for this basename: CKTOTAL, CKMB, CKMBINDEX, TROPONINI,  in the last 168 hours BNP: BNP (last 3 results) No results found for this basename: PROBNP,  in the last 8760 hours CBG: No results found for this basename: GLUCAP,  in the last 168 hours     Signed:  Kinnie Feil  [facility] 08/22/2013, 8:24 AM

## 2013-08-22 NOTE — Progress Notes (Signed)
[place], MD, [address] [place], [place] [address] Phone: [phone] FAX: [phone]    AVEON COLQUHOUN [id number] [birth year]  CARE TEAM:  PCP: Redge Gainer, MD  Outpatient Care Team: Patient Care Team: Chipper Herb, MD as PCP - General (Family Medicine) Adin Hector, MD as Consulting Physician (Colon and Rectal Surgery) Lear Ng, MD as Consulting Physician (Gastroenterology) Gatha Mayer, MD as Consulting Physician (Radiation Oncology) Carola Frost, RN as Registered Nurse (Oncology) [name], MD as Consulting Physician (Oncology) Carlyle Basques, MD as Consulting Physician (Infectious Diseases)  Inpatient Treatment Team: Treatment Team: Attending Provider: Kinnie Feil, MD; Consulting Physician: Adin Hector, MD; Registered Nurse: Buel Ream, RN; Registered Nurse: Lars Masson, RN; Technician: Ileene Rubens, NT; Registered Nurse: Loraine Grip, RN; Rounding Team: Redmond Baseman, MD; Technician: Lennie Odor, NT; Consulting Physician: [name], MD; Registered Nurse: Noreene Larsson, RN   Subjective:  He is feeling much better.  Had a Dr. Malachi Bonds and [employer] Books 3 doses morning without problems.  Bowels moving.  Mostly solid.  No rectal bleeding  Mild lower abdominal cranking cramping but much milder  Objective:  Vital signs:  Filed Vitals:   08/21/13 1600 08/21/13 1610 08/21/13 1620 08/21/13 2128  BP: 120/65 134/74 119/80 109/57  Pulse: 88 96 83 96  Temp:    98.9 F (37.2 C)  TempSrc:      Resp: 15 22 11 20   Height:      Weight:      SpO2: 97% 97% 97% 97%    Last BM Date: 08/21/13  Intake/Output   Yesterday:  03/18 0701 - 03/19 0700 In: 832 [P.O.:582; I.V.:250] Out: 500 [Urine:500] This shift:  Total I/O In: -  Out: 250 [Urine:250]  Bowel function:  Flatus: y  BM: y,  Drain: n/a  Physical Exam:  General: Pt  awake/alert/oriented x4 in no acute distress.  Smiling, joking Eyes: PERRL, normal EOM.  Sclera clear.  No icterus Neuro: CN II-XII intact w/o focal sensory/motor deficits. Lymph: No head/neck/groin lymphadenopathy Psych:  No delerium/psychosis/paranoia HENT: Normocephalic, Mucus membranes moist.  No thrush Neck: Supple, No tracheal deviation Chest: No chest wall pain w good excursion CV:  Pulses intact.  Regular rhythm MS: Normal AROM mjr joints.  No obvious deformity Abdomen: Soft.  Nondistended.  Minimal TTP LLQ abdomen.  No evidence of peritonitis.  No incarcerated hernias. Ext:  SCDs BLE.  No mjr edema.  No cyanosis Skin: No petechiae / purpura   Problem List:   Principal Problem:   Sepsis Active Problems:   Mitral regurgitation due to partially flail posterior mitral valve leaflet   Bacterial endocarditis   C. difficile colitis   Assessment  Tyler Diaz  68 y.o. male  1 Day Post-Op    C. Difficile colitis.  No evidence of any surgical pathology  Plan:  The patient is stable.  There is no evidence of peritonitis, acute abdomen, nor shock.  There is no strong evidence of failure of improvement nor decline with current non-operative management.  There is no need for surgery at the present moment.  I will available as needed.  Oral vancomycin antibiotic regimen x14 days per infectious disease and primary team.   Solid  diet as tolerated.  Probable with probiotic.     VTE prophylaxis- SCDs, etc  Mobilize as tolerated to help recovery  Adin Hector, M.D.,  F.A.C.S. Gastrointestinal and Minimally Invasive Surgery [facility] [address] [place], [place] [address] [phone] Main / Paging   08/22/2013   Results:   Labs: Results for orders placed during the hospital encounter of 08/17/13 (from the past 48 hour(s))  BASIC METABOLIC PANEL     Status: Abnormal   Collection Time    08/21/13  4:15 AM      Result Value  Ref Range   Sodium 137  137 - 147 mEq/L   Potassium 3.6 (*) 3.7 - 5.3 mEq/L   Chloride 99  96 - 112 mEq/L   CO2 24  19 - 32 mEq/L   Glucose, Bld 100 (*) 70 - 99 mg/dL   BUN 13  6 - 23 mg/dL   Creatinine, Ser 1.10  0.50 - 1.35 mg/dL   Calcium 9.2  8.4 - 10.5 mg/dL   GFR calc non Af Amer 67 (*) >90 mL/min   GFR calc Af Amer 77 (*) >90 mL/min   Comment: (NOTE)     The eGFR has been calculated using the CKD EPI equation.     This calculation has not been validated in all clinical situations.     eGFR's persistently <90 mL/min signify possible Chronic Kidney     Disease.  CBC     Status: Abnormal   Collection Time    08/21/13  4:15 AM      Result Value Ref Range   WBC 9.2  4.0 - 10.5 K/uL   RBC 3.11 (*) 4.22 - 5.81 MIL/uL   Hemoglobin 10.0 (*) 13.0 - 17.0 g/dL   HCT 30.8 (*) 39.0 - 52.0 %   MCV 99.0  78.0 - 100.0 fL   MCH 32.2  26.0 - 34.0 pg   MCHC 32.5  30.0 - 36.0 g/dL   RDW 13.3  11.5 - 15.5 %   Platelets 183  150 - 400 K/uL    Imaging / Studies: No results found.  Medications / Allergies: per chart  Antibiotics: Anti-infectives   Start     Dose/Rate Route Frequency Ordered Stop   08/18/13 2330  vancomycin (VANCOCIN) 50 mg/mL oral solution 500 mg     500 mg Oral 4 times per day 08/18/13 2315     08/18/13 2330  metroNIDAZOLE (FLAGYL) IVPB 500 mg  Status:  Discontinued     500 mg 100 mL/hr over 60 Minutes Intravenous Every 8 hours 08/18/13 2315 08/20/13 1128   08/18/13 0700  vancomycin (VANCOCIN) IVPB 750 mg/150 ml premix  Status:  Discontinued     750 mg 150 mL/hr over 60 Minutes Intravenous Every 12 hours 08/17/13 2119 08/18/13 2313   08/18/13 0200  piperacillin-tazobactam (ZOSYN) IVPB 3.375 g  Status:  Discontinued     3.375 g 12.5 mL/hr over 240 Minutes Intravenous Every 8 hours 08/17/13 2119 08/18/13 2313   08/17/13 1745  vancomycin (VANCOCIN) IVPB 1000 mg/200 mL premix     1,000 mg 200 mL/hr over 60 Minutes Intravenous  Once 08/17/13 1739 08/17/13 2004    08/17/13 1745  piperacillin-tazobactam (ZOSYN) IVPB 3.375 g     3.375 g 100 mL/hr over 30 Minutes Intravenous  Once 08/17/13 1739 08/17/13 1904       Note: This dictation was prepared with Dragon/digital dictation along with [employer]. Any transcriptional errors that result from this process are unintentional.

## 2013-08-22 NOTE — Progress Notes (Signed)
Physical Therapy Treatment Patient Details Name: Tyler Diaz MRN: [id number] DOB: 11-20-1945 Today's Date: 08/22/2013 Time: 0034-9179 PT Time Calculation (min): 12 min  PT Assessment / Plan / Recommendation  History of Present Illness Pt was admited with sepsis. He has a h/o rectal CA with reverse colostomy   PT Comments   *Pt independent with mobility, OK to DC home from PT standpoint. DC PT.**  Follow Up Recommendations  No PT follow up     Does the patient have the potential to tolerate intense rehabilitation     Barriers to Discharge        Equipment Recommendations  None recommended by PT    Recommendations for Other Services    Frequency Min 2X/week   Progress towards PT Goals Progress towards PT goals: Goals met/education completed, patient discharged from PT  Plan Current plan remains appropriate    Precautions / Restrictions Restrictions Weight Bearing Restrictions: No   Pertinent Vitals/Pain **7/10 abdominal pain RN notified*    Mobility  Transfers Overall transfer level: Independent Equipment used: None Transfers: Sit to/from Stand Sit to Stand: Independent Ambulation/Gait Ambulation/Gait assistance: Independent Ambulation Distance (Feet): 1000 Feet Assistive device: None Gait Pattern/deviations: WFL(Within Functional Limits) Gait velocity interpretation: at or above normal speed for age/gender General Gait Details: no LOB, pt steady    Exercises     PT Diagnosis:    PT Problem List:   PT Treatment Interventions:     PT Goals (current goals can now be found in the care plan section) Acute Rehab PT Goals Patient Stated Goal: walk; get strength back PT Goal Formulation: With patient Time For Goal Achievement: 08/27/13 Potential to Achieve Goals: Good  Visit Information  Last PT Received On: 08/22/13 Assistance Needed: +1 History of Present Illness: Pt was admited with sepsis. He has a h/o rectal CA with reverse colostomy    Subjective Data  Patient Stated Goal: walk; get strength back   Cognition  Cognition Arousal/Alertness: Awake/alert Behavior During Therapy: WFL for tasks assessed/performed Overall Cognitive Status: Within Functional Limits for tasks assessed    Balance  Balance Overall balance assessment: Independent Sitting balance-Leahy Scale: Normal Standing balance-Leahy Scale: Good  End of Session PT - End of Session Equipment Utilized During Treatment: Gait belt Activity Tolerance: Patient tolerated treatment well Patient left: with call bell/phone within reach;in chair Nurse Communication: Mobility status   GP     Blondell Reveal Kistler 08/22/2013, 9:25 AM [phone]

## 2013-08-23 LAB — CULTURE, BLOOD (ROUTINE X 2)
Culture: NO GROWTH
Culture: NO GROWTH

## 2013-08-24 LAB — CULTURE, BLOOD (ROUTINE X 2)
CULTURE: NO GROWTH
Culture: NO GROWTH

## 2013-08-28 ENCOUNTER — Encounter: Payer: Self-pay | Admitting: Internal Medicine

## 2013-08-28 ENCOUNTER — Ambulatory Visit (INDEPENDENT_AMBULATORY_CARE_PROVIDER_SITE_OTHER): Payer: Medicare Other | Admitting: Internal Medicine

## 2013-08-28 VITALS — BP 126/81 | HR 109 | Temp 97.8°F | Ht 70.0 in | Wt 175.0 lb

## 2013-08-28 DIAGNOSIS — A0472 Enterocolitis due to Clostridium difficile, not specified as recurrent: Secondary | ICD-10-CM

## 2013-08-28 DIAGNOSIS — R197 Diarrhea, unspecified: Secondary | ICD-10-CM | POA: Insufficient documentation

## 2013-08-28 NOTE — Progress Notes (Signed)
Subjective:    Patient ID: Tyler Diaz, male    DOB: February 18, 1946, 68 y.o.   MRN: [id number]  HPI 68yo M with hx of rectal cancer s/p resection and chemo, recently underwent ileostomy take-down and readmitted on 1/30 for enterococcal bacteremia, TEE concerning for mitral valve endocarditis. He was treated with 4 wks of ampicillin plus low dose gentamicin. From antibiotic standpoint, he has loose stools but does take miralax daily, no thrush, no rash. He was admitted on 3/14-3/19 with fever, leukocytosis of 27k, sepsis found to be due to c.difficile colitis. He was discharged on 14 day course of vancomycin 125mg  QID. He states he is somewhat doing better, had a "cow patty" yesterday but also in hte setting of taking one dose of imodium. He usually has 3-4 loose stools per day,but this is also due to him taking miralax daily due to his recent ileostomy take down. No recent fever, chills, nightsweats, no blood in stool, not watery but has frequent small calibar loose bowel movements.  Allergies  Allergen Reactions  . Shrimp [Shellfish Allergy] Hives, Nausea And Vomiting and Rash   Current Outpatient Prescriptions on File Prior to Visit  Medication Sig Dispense Refill  . acetaminophen (TYLENOL) 500 MG tablet Take 500 mg by mouth every 6 (six) hours as needed for headache.      . ferrous sulfate 325 (65 FE) MG tablet Take 1 tablet (325 mg total) by mouth 2 (two) times daily with a meal.  60 tablet  1  . hydrocortisone (PROCTOSOL HC) 2.5 % rectal cream Place 1 application rectally 2 (two) times daily.  30 g  0  . Multiple Vitamin (MULTIVITAMIN WITH MINERALS) TABS Take 1 tablet by mouth every morning.       Marland Kitchen oxyCODONE (OXY IR/ROXICODONE) 5 MG immediate release tablet Take 1 tablet (5 mg total) by mouth every 6 (six) hours as needed for moderate pain, severe pain or breakthrough pain.  20 tablet  0  . oxymetazoline (AFRIN) 0.05 % nasal spray Place 1 spray into both nostrils 2 (two) times daily as needed  for congestion.      . promethazine (PHENERGAN) 25 MG tablet Take 25 mg by mouth every 6 (six) hours as needed for nausea.       Marland Kitchen saccharomyces boulardii (FLORASTOR) 250 MG capsule Take 1 capsule (250 mg total) by mouth 2 (two) times daily.      . vancomycin (VANCOCIN) 50 mg/mL oral solution Take 10 mLs (500 mg total) by mouth every 6 (six) hours.  850 mL  0   No current facility-administered medications on file prior to visit.   Active Ambulatory Problems    Diagnosis Date Noted  . Rectal cancer - distal s/p lap LAR/coloanal July 2014 - [id number]. 07/27/2012  . Hypertension   . Vertigo   . Mitral regurgitation due to partially flail posterior mitral valve leaflet 07/30/2012  . Hyperlipidemia   . Bilateral inguinal hernia (BIH) 01/03/2013  . Anemia in neoplastic disease 01/14/2013  . Stricture of coloanal anastomosis 04/17/2013  . Bacterial endocarditis 07/06/2013  . Constipation, chronic 08/16/2013  . Sepsis 08/17/2013  . C. difficile colitis 08/19/2013   Resolved Ambulatory Problems    Diagnosis Date Noted  . Fecal urgency due to rectal cancer 07/27/2012  . Umbilical hernia, incarcerated - s/p primary repair July 2014 01/03/2013  . Ileostomy - diverting loop - s/p takedown 06/28/2013 01/14/2013  . Herpes zoster - R flank - with severe pain 03/11/2013   Past Medical  History  Diagnosis Date  . Colon cancer 07/18/12 bx  . Rheumatic fever as child  . Hearing loss   . Tinnitus   . Sinusitis   . Arthritis   . Hematochezia   . Heart murmur   . Hypothyroidism as child  . Numbness   . History of shingles   . Blood clot in vein   . GERD (gastroesophageal reflux disease)   . Nocturia   . Radiation      Review of Systems  Constitutional: Negative for fever, chills, diaphoresis, activity change, appetite change, fatigue and unexpected weight change.  HENT: Negative for congestion, sore throat, rhinorrhea, sneezing, trouble swallowing and sinus pressure.  Eyes: Negative for  photophobia and visual disturbance.  Respiratory: Negative for cough, chest tightness, shortness of breath, wheezing and stridor.  Cardiovascular: Negative for chest pain, palpitations and leg swelling.  Gastrointestinal: + diarrhea. Negative for nausea, vomiting, abdominal pain,  constipation, blood in stool, abdominal distention and anal bleeding.  Genitourinary: Negative for dysuria, hematuria, flank pain and difficulty urinating.  Musculoskeletal: Negative for myalgias, back pain, joint swelling, arthralgias and gait problem.  Skin: Negative for color change, pallor, rash and wound.  Neurological: Negative for dizziness, tremors, weakness and light-headedness.  Hematological: Negative for adenopathy. Does not bruise/bleed easily.  Psychiatric/Behavioral: Negative for behavioral problems, confusion, sleep disturbance, dysphoric mood, decreased concentration and agitation.       Objective:   Physical Exam BP 126/81  Pulse 109  Temp(Src) 97.8 F (36.6 C) (Oral)  Ht 5\' 10"  (1.778 m)  Wt 175 lb (79.379 kg)  BMI 25.11 kg/m2 gen = a x o by 3 in NAD HEENT = [place]/AT, moist MM, PERRLA, no scleral icterus Skin = warm, dry, no rash       Assessment & Plan:  cdifficile colitis = unclear if he is significantly improved. We will place him on a taper. He finishes QID dosing on 3/29 then will do TID x 5 days, followed by BID x 5 days, then daily x 5 days. He is to call us if he runs out of medication. Continue on florastor x 3-4 wks. He is given instruction to call us back if symptoms return for concern of relapse.  Asked him to hold taking miralax for one day if his stool is already watery. Also minimize use of imodium.  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 No longer having bacteremia  rtc prn

## 2013-08-29 ENCOUNTER — Ambulatory Visit (INDEPENDENT_AMBULATORY_CARE_PROVIDER_SITE_OTHER): Payer: Medicare Other | Admitting: Family Medicine

## 2013-08-29 VITALS — BP 125/73 | HR 98 | Temp 96.6°F | Ht 70.0 in | Wt 175.4 lb

## 2013-08-29 DIAGNOSIS — R209 Unspecified disturbances of skin sensation: Secondary | ICD-10-CM

## 2013-08-29 DIAGNOSIS — R200 Anesthesia of skin: Secondary | ICD-10-CM

## 2013-08-29 DIAGNOSIS — B0229 Other postherpetic nervous system involvement: Secondary | ICD-10-CM

## 2013-08-29 DIAGNOSIS — A0472 Enterocolitis due to Clostridium difficile, not specified as recurrent: Secondary | ICD-10-CM

## 2013-08-29 DIAGNOSIS — G894 Chronic pain syndrome: Secondary | ICD-10-CM

## 2013-08-29 LAB — POCT CBC
Granulocyte percent: 81.2 %G — AB (ref 37–80)
HCT, POC: 29.4 % — AB (ref 43.5–53.7)
Hemoglobin: 9.4 g/dL — AB (ref 14.1–18.1)
Lymph, poc: 2 (ref 0.6–3.4)
MCH, POC: 30.9 pg (ref 27–31.2)
MCHC: 31.9 g/dL (ref 31.8–35.4)
MCV: 97.1 fL — AB (ref 80–97)
MPV: 6.7 fL (ref 0–99.8)
POC Granulocyte: 10.6 — AB (ref 2–6.9)
POC LYMPH PERCENT: 15.7 %L (ref 10–50)
Platelet Count, POC: 567 10*3/uL — AB (ref 142–424)
RBC: 3 M/uL — AB (ref 4.69–6.13)
RDW, POC: 14 %
WBC: 13 10*3/uL — AB (ref 4.6–10.2)

## 2013-08-29 MED ORDER — GABAPENTIN 100 MG PO CAPS
100.0000 mg | ORAL_CAPSULE | Freq: Three times a day (TID) | ORAL | Status: DC
Start: 2013-08-29 — End: 2013-10-17

## 2013-08-29 MED ORDER — OXYCODONE HCL 5 MG PO TABS: 5.0000 mg | ORAL_TABLET | Freq: Four times a day (QID) | ORAL | Status: DC | PRN

## 2013-08-29 NOTE — Progress Notes (Signed)
   Subjective:    Patient ID: Tyler Diaz, male    DOB: 1945-12-30, 69 y.o.   MRN: [id number]  HPI This 68 y.o. male presents for evaluation of follow up from hospital.  He has hx of colorectal cancer and had to chemo and bowel resection. He developed endocarditis after having the colostomy reversed and then was on abx's via picc for endocarditis and then developed c-diff colitis and sepsis and was recently hospitalized.  He was discharged from the hospital 08/17/13. He is taking vancomycin 125mg  po qid and will stop in a week.  He states his bm's are becoming normal and he feels a lot better.  He had shingles and is still having pain in his left back and abdomen where his shingles was. His surgeon told him he needs to follow up with his pcp for pain medicine.  He is taking percocet 5mg   Po tid prn pain he states.  He has numbness and tingling in his hands and feet as well. Review of Systems C/o numbness, tingling, abdominal and back pain.   No chest pain, SOB, HA, dizziness, vision change, N/V, diarrhea, constipation, dysuria, urinary urgency or frequency, myalgias, arthralgias or rash.  Objective:Marland Kitchen    Physical Exam  Vital signs noted  Elderly male in NAD.  HEENT - Head atraumatic Normocephalic                Eyes - PERRLA, Conjuctiva - clear Sclera- Clear EOMI                Ears - EAC's Wnl TM's Wnl Gross Hearing WNL                Throat - oropharanx wnl Respiratory - Lungs CTA bilateral Cardiac - RRR S1 and S2 without murmur GI - Abdomen soft Nontender and bowel sounds active x 4 Extremities - No edema. Neuro - Grossly intact.      Assessment & Plan:  Post herpetic neuralgia - Plan: oxyCODONE (OXY IR/ROXICODONE) 5 MG immediate release tablet, gabapentin (NEURONTIN) 100 MG capsule, POCT CBC, CMP14+EGFR, Vitamin B12  Enteritis due to Clostridium difficile - Continue vancomycin 125mg  po qid and explained if sx's return to follow up prn.  Chronic pain syndrome - Discussed that he  needs to start neurontin and will try to decrease oxycodone need and follow up when need refill of oxycodone.  Numbness - Get B12 level  Follow up in 3-4 weeks or prn.  Follow up with General surgery, oncology  Lysbeth Penner FNP

## 2013-08-30 LAB — CMP14+EGFR
ALT: 49 IU/L — ABNORMAL HIGH (ref 0–44)
AST: 46 IU/L — ABNORMAL HIGH (ref 0–40)
Albumin/Globulin Ratio: 1.5 (ref 1.1–2.5)
Albumin: 3.8 g/dL (ref 3.6–4.8)
Alkaline Phosphatase: 101 IU/L (ref 39–117)
BUN/Creatinine Ratio: 13 (ref 10–22)
BUN: 15 mg/dL (ref 8–27)
CO2: 21 mmol/L (ref 18–29)
Calcium: 8.8 mg/dL (ref 8.6–10.2)
Chloride: 98 mmol/L (ref 97–108)
Creatinine, Ser: 1.17 mg/dL (ref 0.76–1.27)
GFR calc Af Amer: 73 mL/min/{1.73_m2} (ref >59)
GFR calc non Af Amer: 63 mL/min/{1.73_m2} (ref >59)
Globulin, Total: 2.6 g/dL (ref 1.5–4.5)
Glucose: 94 mg/dL (ref 65–99)
Potassium: 5.2 mmol/L (ref 3.5–5.2)
Sodium: 141 mmol/L (ref 134–144)
Total Bilirubin: 0.4 mg/dL (ref 0.0–1.2)
Total Protein: 6.4 g/dL (ref 6.0–8.5)

## 2013-08-30 LAB — VITAMIN B12: Vitamin B-12: 779 pg/mL (ref 211–946)

## 2013-09-08 ENCOUNTER — Other Ambulatory Visit (INDEPENDENT_AMBULATORY_CARE_PROVIDER_SITE_OTHER): Payer: Self-pay | Admitting: Surgery

## 2013-09-13 ENCOUNTER — Encounter (INDEPENDENT_AMBULATORY_CARE_PROVIDER_SITE_OTHER): Payer: Self-pay

## 2013-09-19 ENCOUNTER — Ambulatory Visit (INDEPENDENT_AMBULATORY_CARE_PROVIDER_SITE_OTHER): Payer: Medicare Other | Admitting: Surgery

## 2013-09-19 ENCOUNTER — Encounter (INDEPENDENT_AMBULATORY_CARE_PROVIDER_SITE_OTHER): Payer: Self-pay | Admitting: Surgery

## 2013-09-19 VITALS — BP 130/86 | HR 118 | Temp 96.6°F | Resp 16 | Ht 70.0 in | Wt 176.8 lb

## 2013-09-19 DIAGNOSIS — C20 Malignant neoplasm of rectum: Secondary | ICD-10-CM

## 2013-09-19 DIAGNOSIS — L290 Pruritus ani: Secondary | ICD-10-CM

## 2013-09-19 DIAGNOSIS — K624 Stenosis of anus and rectum: Secondary | ICD-10-CM

## 2013-09-19 NOTE — Patient Instructions (Signed)
Please consider the recommendations that we have given you today:  Consider seeing physical therapy for pelvic floor exercises and training.  Use white thick barrier cream around the anus to help irritated areas heal up.  Continue MiraLAX every day of your life.  Regulate the dose of that you are having one soft BM a day.  Goal is one soft (play-dough consistency) bowel movement a day.  If having frequent bowel movements, back off.  If constipated, increase.  See the Handout(s) we have given you.  Please call our office at [phone] if you wish to schedule surgery or if you have further questions / concerns.   GETTING TO GOOD BOWEL HEALTH. Irregular bowel habits such as constipation and diarrhea can lead to many problems over time.  Having one soft bowel movement a day is the most important way to prevent further problems.  The anorectal canal is designed to handle stretching and feces to safely manage our ability to get rid of solid waste (feces, poop, stool) out of our body.  BUT, hard constipated stools can act like ripping concrete bricks and diarrhea can be a burning fire to this very sensitive area of our body, causing inflamed hemorrhoids, anal fissures, increasing risk is perirectal abscesses, abdominal pain/bloating, an making irritable bowel worse.     The goal: ONE SOFT BOWEL MOVEMENT A DAY!  To have soft, regular bowel movements:    Drink at least 8 tall glasses of water a day.     Take plenty of fiber.  Fiber is the undigested part of plant food that passes into the colon, acting s "natures broom" to encourage bowel motility and movement.  Fiber can absorb and hold large amounts of water. This results in a larger, bulkier stool, which is soft and easier to pass. Work gradually over several weeks up to 6 servings a day of fiber (25g a day even more if needed) in the form of: o Vegetables -- Root (potatoes, carrots, turnips), leafy green (lettuce, salad greens, celery, spinach), or  cooked high residue (cabbage, broccoli, etc) o Fruit -- Fresh (unpeeled skin & pulp), Dried (prunes, apricots, cherries, etc ),  or stewed ( applesauce)  o Whole grain breads, pasta, etc (whole wheat)  o Bran cereals    Bulking Agents -- This type of water-retaining fiber generally is easily obtained each day by one of the following:  o Psyllium bran -- The psyllium plant is remarkable because its ground seeds can retain so much water. This product is available as Metamucil, Konsyl, Effersyllium, Per Diem Fiber, or the less expensive generic preparation in drug and health food stores. Although labeled a laxative, it really is not a laxative.  o Methylcellulose -- This is another fiber derived from wood which also retains water. It is available as Citrucel. o Polyethylene Glycol - and "artificial" fiber commonly called Miralax or Glycolax.  It is helpful for people with gassy or bloated feelings with regular fiber o Flax Seed - a less gassy fiber than psyllium   No reading or other relaxing activity while on the toilet. If bowel movements take longer than 5 minutes, you are too constipated   AVOID CONSTIPATION.  High fiber and water intake usually takes care of this.  Sometimes a laxative is needed to stimulate more frequent bowel movements, but    Laxatives are not a good long-term solution as it can wear the colon out. o Osmotics (Milk of Magnesia, Fleets phosphosoda, Magnesium citrate, MiraLax, GoLytely) are safer  than  o Stimulants (Senokot, Castor Oil, Dulcolax, Ex Lax)    o Do not take laxatives for more than 7days in a row.    IF SEVERELY CONSTIPATED, try a Bowel Retraining Program: o Do not use laxatives.  o Eat a diet high in roughage, such as bran cereals and leafy vegetables.  o Drink six (6) ounces of prune or apricot juice each morning.  o Eat two (2) large servings of stewed fruit each day.  o Take one (1) heaping tablespoon of a psyllium-based bulking agent twice a day. Use  sugar-free sweetener when possible to avoid excessive calories.  o Eat a normal breakfast.  o Set aside 15 minutes after breakfast to sit on the toilet, but do not strain to have a bowel movement.  o If you do not have a bowel movement by the third day, use an enema and repeat the above steps.    Controlling diarrhea o Switch to liquids and simpler foods for a few days to avoid stressing your intestines further. o Avoid dairy products (especially milk & ice cream) for a short time.  The intestines often can lose the ability to digest lactose when stressed. o Avoid foods that cause gassiness or bloating.  Typical foods include beans and other legumes, cabbage, broccoli, and dairy foods.  Every person has some sensitivity to other foods, so listen to our body and avoid those foods that trigger problems for you. o Adding fiber (Citrucel, Metamucil, psyllium, Miralax) gradually can help thicken stools by absorbing excess fluid and retrain the intestines to act more normally.  Slowly increase the dose over a few weeks.  Too much fiber too soon can backfire and cause cramping & bloating. o Probiotics (such as active yogurt, Align, etc) may help repopulate the intestines and colon with normal bacteria and calm down a sensitive digestive tract.  Most studies show it to be of mild help, though, and such products can be costly. o Medicines:   Bismuth subsalicylate (ex. Kayopectate, Pepto Bismol) every 30 minutes for up to 6 doses can help control diarrhea.  Avoid if pregnant.   Loperamide (Immodium) can slow down diarrhea.  Start with two tablets (4mg  total) first and then try one tablet every 6 hours.  Avoid if you are having fevers or severe pain.  If you are not better or start feeling worse, stop all medicines and call your doctor for advice o Call your doctor if you are getting worse or not better.  Sometimes further testing (cultures, endoscopy, X-ray studies, bloodwork, etc) may be needed to help diagnose  and treat the cause of the diarrhea.  Anal Pruritus Anal pruritus is an itching of the anus, which is often due to increased moisture of the skin around the anus. Moisture may be due to sweating or a small amount of remaining stool. The itching and scratching can cause further skin damage.  CAUSES   Poor hygiene.  Excessive moisture from sweating or residual stool in the anal area.  Perfumed soaps and sprays and colored toilet paper.  Chemicals in the foods you eat.  Dietary factors such as caffeine, beer, milk products, chocolate, nuts, citrus fruits, tomatoes, spicy seasonings, jalapeno peppers, and salsa.  Hemorrhoids, infections, and other anal diseases.  Excessive washing.  Overuse of laxatives.  Skin disorders (psoriasis, eczema, or seborrhea). HOME CARE INSTRUCTIONS   Practice good hygiene.  Clean the anal area gently with wet toilet paper, baby wipes, or a wet washcloth after every bowel movement  and at bedtime. Avoid using soaps on the anal area. Dry the area thoroughly. Pat the area dry with toilet paper or a towel.  Do not scrub the anal area with anything, even toilet paper.  Try not to scratch the itchy area. Scratching produces more damage, which makes the itching worse.  Take sitz baths in warm water for 15 to 20 minutes, 2 to 3 times a day. Pat the area dry with a soft cloth after each bath.  Zinc oxide ointment or a moisture barrier cream can be applied several times daily to protect the skin.  Only take medicines as directed by your caregiver.  Talk to your caregiver about fiber supplements. These are helpful in normalizing the stool if you have frequent loose stools.  Wear cotton underwear and loose clothing.  Do not use irritants such as bubble baths, scented toilet paper, or genital deodorants. SEEK MEDICAL CARE IF:   Itching does not improve in several days or gets worse.  You have a fever.  There are problems with increased pain, swelling, or  redness. MAKE SURE YOU:   Understand these instructions.  Will watch your condition.  Will get help right away if you are not doing well or get worse. Document Released: 11/22/2010 Document Revised: 08/15/2011 Document Reviewed: 11/22/2010 [facility] Patient Information 2014 [place].   Pelvic floor muscle training exercises  can help strengthen the muscles under the uterus, bladder, and bowel (large intestine). They can help both men and women who have problems with urine leakage or bowel control.  A pelvic floor muscle training exercise is like pretending that you have to urinate, and then holding it. You relax and tighten the muscles that control urine flow. It's important to find the right muscles to tighten.  The next time you have to urinate, start to go and then stop. Feel the muscles in your vagina, bladder, or anus get tight and move up. These are the pelvic floor muscles. If you feel them tighten, you've done the exercise right. If you are still not sure whether you are tightening the right muscles, keep in mind that all of the muscles of the pelvic floor relax and contract at the same time. Because these muscles control the bladder, rectum, and vagina, the following tips may help: Women: Insert a finger into your vagina. Tighten the muscles as if you are holding in your urine, then let go. You should feel the muscles tighten and move up and down.  Men: Insert a finger into your rectum. Tighten the muscles as if you are holding in your urine, then let go. You should feel the muscles tighten and move up and down. These are the same muscles you would tighten if you were trying to prevent yourself from passing gas.  It is very important that you keep the following muscles relaxed while doing pelvic floor muscle training exercises: Abdominal  Buttocks (the deeper, anal sphincter muscle should contract)  Thigh   A woman can also strengthen these muscles by using a vaginal cone,  which is a weighted device that is inserted into the vagina. Then you try to tighten the pelvic floor muscles to hold the device in place. If you are unsure whether you are doing the pelvic floor muscle training correctly, you can use biofeedback and electrical stimulation to help find the correct muscle group to work. Biofeedback is a method of positive reinforcement. Electrodes are placed on the abdomen and along the anal area. Some therapists place a  sensor in the vagina in women or anus in men to monitor the contraction of pelvic floor muscles.  A monitor will display a graph showing which muscles are contracting and which are at rest. The therapist can help find the right muscles for performing pelvic floor muscle training exercises.   PERFORMING PELVIC FLOOR EXERCISES: 1. Begin by emptying your bladder. 2. Tighten the pelvic floor muscles and hold for a count of 10. 3. Relax the muscles completely for a count of 10. 4. Do 10 repititions, 3 to 5 times a day (morning, afternoon, and night). You can do these exercises at any time and any place. Most people prefer to do the exercises while lying down or sitting in a chair. After 4 - 6 weeks, most people notice some improvement. It may take as long as 3 months to see a major change. After a couple of weeks, you can also try doing a single pelvic floor contraction at times when you are likely to leak (for example, while getting out of a chair). A word of caution: Some people feel that they can speed up the progress by increasing the number of repetitions and the frequency of exercises. However, over-exercising can instead cause muscle fatigue and increase urine leakage. If you feel any discomfort in your abdomen or back while doing these exercises, you are probably doing them wrong. Breathe deeply and relax your body when you are doing these exercises. Make sure you are not tightening your stomach, thigh, buttock, or chest muscles. When done the right  way, pelvic floor muscle exercises have been shown to be very effective at improving urinary continence. Alternative Names Kegel exercises  Fecal Incontinence Fecal incontinence is the inability to control your bowels. When you feel the urge to have a bowel movement, you may not be able to wait until you can get to a restroom. Stool may leak from the rectum unexpectedly. CAUSES   Constipation.  Damage to the nerves of the anal sphincter muscles or the rectum.  Diarrhea.  Damage to the anal sphincter muscles.  Loss of storage capacity in the rectum.  Pelvic floor dysfunction. DIAGNOSIS  Your caregiver will ask questions, do a physical exam, and possibly order other tests. These tests may include:  Anal manometry. This checks the anal sphincter tightness and its ability to respond to signals, as well as the sensitivity and function of the rectum.  Anorectal ultrasonography. This test evaluates the structure of the anal sphincters.  Proctography (also called defecography). This test shows how much stool the rectum can hold, how well the rectum holds it, and how well the rectum can get rid of the stool.  Proctosigmoidoscopy. This test allows caregivers to look inside the rectum for signs of disease or other problems that could cause fecal incontinence, such as inflammation, tumors, or scar tissue.  Anal electromyography. This tests for nerve damage. TREATMENT  Treatment depends on the cause and severity of fecal incontinence. It may include dietary changes, medicine, bowel training, or surgery. More than one treatment may be necessary for successful control. Treatment may include:  Adjusting what and how you eat.  Medicine to help you develop a more regular bowel pattern. Medicine may also be prescribed for diarrhea.  Bowel training to help you learn how to control your bowels (biofeedback). In some cases, it involves strengthening muscles. In others, it means training the bowels to  empty at a specific time of day.  Surgery to repair damaged areas or to replace the  anal muscle.  A colostomy if other treatments fail. This involves removing a portion of the bowel. The remaining part is then attached to either the anus, or to a hole in the abdomen (stoma) through which stool leaves the body and is collected in a pouch. HOME CARE INSTRUCTIONS   Eat high fiber foods only if directed by your caregiver. Fiber adds bulk and makes stool easier to control. Oppositely, high fiber foods can act as a laxative and can make the problem worse.  Keep a food diary. List what you eat, how much you eat, and when you have an incontinent episode. After a few days, you may begin to see a pattern involving certain foods and incontinence. After you identify foods that seem to cause problems, cut back on them and see whether incontinence improves.  Avoid the following foods and drinks that often cause diarrhea:  Caffeine.  Spicy foods.  Fatty and greasy foods.  Dairy products (milk, cheese, and ice cream).  Cured or smoked meat like sausage, ham, or [place].  Alcohol.  Fruits like apples, peaches, or pears.  Ask your doctor if you need a vitamin supplement.  Drink enough water and fluids to keep your urine clear or pale yellow.  Wear cotton underwear and loose clothes that "breathe." Tight clothes that block air can worsen anal problems. Change soiled underwear as soon as possible.  Wash the anal area with water, not soap, after each bowel movement to help with anal discomfort. Use pre-moistened, alcohol-free wipes. Try using non-medicated talcum powder or corn starch to relieve anal discomfort.  Your caregiver may recommend an appropriate cream or ointment that can help prevent skin irritation from direct contact with stool.  Talk to your caregiver if you are having emotional distress. TIPS  Take a bag containing cleanup supplies and a change of clothing with you  everywhere.  Locate public restrooms before you need them so you know where to go.  Use the toilet before heading out.  Wear disposable undergarments or sanitary pads if you think an episode is likely.  Use oral fecal deodorants to add to your comfort level if episodes are frequent. FOR MORE INFORMATION  [employer]: www.AromatherapyParty.no International [employer] Gastrointestinal [employer]: www.iffgd.org Document Released: 05/04/2004 Document Revised: 08/15/2011 Document Reviewed: 09/28/2009 [facility] Patient Information 2014 [place], [place].

## 2013-09-19 NOTE — Progress Notes (Signed)
Subjective:     Patient ID: Tyler Diaz, male   DOB: [birth year], 68 y.o.   MRN: [id number]  HPI   Note: This dictation was prepared with Dragon/digital dictation along with [employer]. Any transcriptional errors that result from this process are unintentional.       Tyler Diaz  [birth year] [id number]  Patient Care Team: Chipper Herb, MD as PCP - General (Family Medicine) Adin Hector, MD as Consulting Physician (Colon and Rectal Surgery) Lear Ng, MD as Consulting Physician (Gastroenterology) Gatha Mayer, MD as Consulting Physician (Radiation Oncology) Carola Frost, RN as Registered Nurse (Oncology) [name], MD as Consulting Physician (Oncology) Carlyle Basques, MD as Consulting Physician (Infectious Diseases)  Procedure (Date: 06/28/2013):  POST-OPERATIVE DIAGNOSIS:  Rectal cancer status post low anterior resection, coloanal anastomosis, diverting loop ileostomy.  Stricture of coloanal anastomosis.   PROCEDURE: Procedure(s):  loop ILEOSTOMY TAKEDOWN  Dilation of coloanal anastomosis  Examination of anesthesia   SURGEON: Surgeon(s):  Adin Hector, MD  Edward Jolly, MD - Asst   This patient returns for surgical re-evaluation.  He is gradually recovering.  He was readmitted for some concerns of dehydration and fever.  CT scan showed no abscess.  Small seroma.  No wound infection.  Positive blood cultures.  Echocardiogram suspicious for endocarditis.  Started on IV antibiotics.  Infectious disease consultation made.  Recommendation made for 4 weeks of IV antibiotics - finished last Month.  Then developed C. Difficile colitis.  He completed antibiotics over a week ago.  His pain is decreased.  He is stopped narcotics a week ago.  He has been having some occasional loose stools but better overall.  However, he has had a lot of palpable today.  Healing stress.  Plato consistency.  No bleeding.  No fevers or chills.  Caliber of bowels  narrowed but not severe.  He is eating great.  Some fecal urgency but no incontinence.  Occasionally mild loose or narrow stools.  He comes today by himself.  Walking okay.  No fevers or chills now.  No shingles or skin problems.  No bleeding from his ostomy site.  Patient Active Problem List   Diagnosis Date Noted  . Diarrhea 08/28/2013  . C. difficile colitis 08/19/2013  . Sepsis 08/17/2013  . Constipation, chronic 08/16/2013  . Bacterial endocarditis 07/06/2013  . Stricture of coloanal anastomosis 04/17/2013  . Anemia in neoplastic disease 01/14/2013  . Bilateral inguinal hernia (BIH) 01/03/2013  . Mitral regurgitation due to partially flail posterior mitral valve leaflet 07/30/2012  . Hyperlipidemia   . Rectal cancer - distal s/p lap LAR/coloanal July 2014 - [id number]. 07/27/2012  . Hypertension   . Vertigo     Past Medical History  Diagnosis Date  . Mitral regurgitation due to partially flail posterior mitral valve leaflet   . Rectal cancer   . Hyperlipidemia   . Colon cancer 07/18/12 bx    rectum=Invasive adenocarcinoma w/ extracellular mucin,polyp=benign  . Rheumatic fever as child    hx  . Hearing loss     partial greater on left  . Tinnitus     left ear  . Sinusitis     seasonal  . Arthritis     hands/knees  . Hematochezia     recent  . Heart murmur   . Hypothyroidism as child    hx of  . Numbness     TOES / FINGERS DUE TO CHEMO  . History of shingles   .  Blood clot in vein     RT ARM WITH PICC LINE   . GERD (gastroesophageal reflux disease)     OCCASIONAL  . Nocturia   . Radiation     FOR COLON CANCER  . Umbilical hernia, incarcerated - s/p primary repair July 2014 01/03/2013  . Herpes zoster - R flank - with severe pain 03/11/2013  . Ileostomy - diverting loop - s/p takedown 06/28/2013 01/14/2013    Past Surgical History  Procedure Laterality Date  . Carpal tunnel release Right 2002  . Tonsillectomy      as child  . Laparoscopic low anterior  resection N/A 12/27/2012    Procedure: LAPAROSCOPIC LOW ANTERIOR RESECTION, COLO-ANAL ANASTOMOSIS, DIVERTING LOOP ILEOSTOMY,SPLENIC FLEXURE MOBILIZATION, PRIMARY INCARCERATED UMBILICAL HERNIA REPAIR;  Surgeon: Adin Hector, MD;  Location: [facility];  Service: General;  Laterality: N/A;  . Laparoscopic low anterior rescection with coloanal anastomosis N/A 12/27/2012    Procedure: LAPAROSCOPIC LOW ANTERIOR RESCECTION WITH COLOANAL ANASTOMOSIS;  Surgeon: Adin Hector, MD;  Location: [facility];  Service: General;  Laterality: N/A;  . Ileostomy N/A 12/27/2012    Procedure: DIVERTING LOOP ILEOSTOMY;  Surgeon: Adin Hector, MD;  Location: [facility];  Service: General;  Laterality: N/A;  . Umbilical hernia repair  12/27/2012    Procedure: PRIMARY REPAIR INCARCERATED UMBILICAL HERNIA;  Surgeon: Adin Hector, MD;  Location: [facility];  Service: General;;  . Hernia repair    . Ileostomy closure N/A 06/28/2013    Procedure: loop ILEOSTOMY TAKEDOWN examination of anesthesia ;  Surgeon: Adin Hector, MD;  Location: [facility];  Service: General;  Laterality: N/A;  . Tee without cardioversion N/A 07/10/2013    Procedure: TRANSESOPHAGEAL ECHOCARDIOGRAM (TEE);  Surgeon: Candee Furbish, MD;  Location: [facility];  Service: Cardiovascular;  Laterality: N/A;  . Tee without cardioversion N/A 08/21/2013    Procedure: TRANSESOPHAGEAL ECHOCARDIOGRAM (TEE);  Surgeon: Sanda Klein, MD;  Location[facility];  Service: Cardiovascular;  Laterality: N/A;    History   Social History  . Marital Status: Married    Spouse Name: N/A    Number of Children: 2  . Years of Education: N/A   Occupational History  . Retired     Chiropractor   Social History Main Topics  . Smoking status: Former Smoker -- 1.50 packs/day for 20 years    Types: Cigarettes    Quit date: 07/27/1972  . Smokeless tobacco: Never Used     Comment: quit smoking 35 years ago  . Alcohol Use: No  . Drug Use: No  . Sexual Activity: Not on  file   Other Topics Concern  . 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  Married    Family History  Problem Relation Age of Onset  . Cancer Mother     abdominal ca ?ovarian?  . Cancer Sister     liver cancer    Current Outpatient Prescriptions  Medication Sig Dispense Refill  . acetaminophen (TYLENOL) 500 MG tablet Take 500 mg by mouth every 6 (six) hours as needed for headache.      Marland Kitchen acetaminophen-codeine (TYLENOL #3) 300-30 MG per tablet       . diazepam (VALIUM) 2 MG tablet Take 2 mg by mouth at bedtime as needed for sedation.      . ferrous sulfate 325 (65 FE) MG tablet Take 1 tablet (325 mg total) by mouth 2 (two) times daily with a  meal.  60 tablet  1  . gabapentin (NEURONTIN) 100 MG capsule Take 1 capsule (100 mg total) by mouth 3 (three) times daily.  90 capsule  3  . hydrocortisone (PROCTOSOL HC) 2.5 % rectal cream Place 1 application rectally 2 (two) times daily.  30 g  0  . Loperamide HCl (IMODIUM PO) Take 1 tablet by mouth. Took only to go to MD appointment 3/24      . Multiple Vitamin (MULTIVITAMIN WITH MINERALS) TABS Take 1 tablet by mouth every morning.       Marland Kitchen oxyCODONE (OXY IR/ROXICODONE) 5 MG immediate release tablet Take 1 tablet (5 mg total) by mouth every 6 (six) hours as needed for moderate pain, severe pain or breakthrough pain.  30 tablet  0  . oxymetazoline (AFRIN) 0.05 % nasal spray Place 1 spray into both nostrils 2 (two) times daily as needed for congestion.      . polyethylene glycol (MIRALAX / GLYCOLAX) packet Take 17 g by mouth daily.      . promethazine (PHENERGAN) 25 MG tablet Take 25 mg by mouth every 6 (six) hours as needed for nausea.       Marland Kitchen saccharomyces boulardii (FLORASTOR) 250 MG capsule Take 1 capsule (250 mg total) by mouth 2 (two) times daily.      . vancomycin (VANCOCIN) 50 mg/mL oral solution Take 10 mLs (500 mg total) by mouth every 6 (six) hours.  850 mL  0   No current  facility-administered medications for this visit.     Allergies  Allergen Reactions  . Shrimp [Shellfish Allergy] Hives, Nausea And Vomiting and Rash    BP 130/86  Pulse 118  Temp(Src) 96.6 F (35.9 C) (Temporal)  Resp 16  Ht 5\' 10"  (1.778 m)  Wt 176 lb 12.8 oz (80.196 kg)  BMI 25.37 kg/m2  Ct Abdomen Pelvis W Contrast  07/07/2013   CLINICAL DATA:  History of rectal carcinoma. Status post recent ileostomy take-down. Positive blood cultures. Normal white blood cell count. Patient was chills and fever. Patient has positive blood cultures.  EXAM: CT ABDOMEN AND PELVIS WITH CONTRAST  TECHNIQUE: Multidetector CT imaging of the abdomen and pelvis was performed using the standard protocol following bolus administration of intravenous contrast.  CONTRAST:  29mL OMNIPAQUE IOHEXOL 300 MG/ML SOLN, 1110mL OMNIPAQUE IOHEXOL 300 MG/ML SOLN  COMPARISON:  08/02/2012  FINDINGS: There is irregular fluid attenuation associated with hazy and reticular inflammatory change in the subcutaneous soft tissues of the right lower quadrant. The irregular fluid measures approximately 3.6 cm x 2.2 cm x 0.5 cm. This may reflect a small area of sterile postoperative fluid with associated edema. An early abscess is possible.  There is presacral edema and mild stranding in the fat of the pelvis that is all felt to be post surgical in origin. There is no intra-abdominal collection/abscess.  The appendix is dilated to 9 mm. There is no significant associated inflammatory change. This could potentially reflect an early acute appendicitis, but is not conclusive.  The ileal anastomosis in the right lower quadrant is unremarkable. No bowel wall thickening or inflammatory change. No bowel dilation to suggest obstruction or significant ileus.  Minimal lung base subsegmental atelectasis. Lung bases are otherwise clear.  Liver and spleen are unremarkable.  Multiple gallstones. No evidence of acute cholecystitis. No bile duct dilation. Normal  pancreas. No adrenal masses.  Small renal low-density lesions, likely cysts. Kidneys are otherwise unremarkable. Normal ureters. Normal bladder. Prostate is enlarged but stable.  No pathologically  enlarged lymph nodes.  No ascites.  IMPRESSION: 1. There are 2 potential sources for positive blood cultures. 2. There is a small irregular fluid collection within the subcutaneous fat of the right lower quadrant that could reflect a poorly defined abscess with associated cellulitis. It may simply reflect sterile postoperative fluid and edema. 3. The appendix is dilated 9 mm. This could reflect an early acute appendicitis but is not conclusive. 4. There is no intra-abdominal or pelvic abscess. Mild pelvic fat stranding and presacral edema is consistent with the expected postsurgical change. 5. No evidence of bowel inflammation or obstruction. No adynamic ileus. 6. Other chronic findings as described are stable from the prior CT.   Electronically Signed   By: Lajean Manes M.D.   On: 07/07/2013 09:00   Dg Abd Acute W/chest  07/05/2013   CLINICAL DATA:  Fever after recent ileostomy take-down  EXAM: ACUTE ABDOMEN SERIES (ABDOMEN 2 VIEW & CHEST 1 VIEW)  COMPARISON:  08/02/2012 chest CT  FINDINGS: Normal heart size and mediastinal contours. No acute infiltrate or edema. No effusion or pneumothorax. There is minimal atelectasis or scar at the peripheral left base. Previously noted pulmonary nodules are not visible. No acute osseous findings.  No gas dilated bowel. No abnormal fluid levels. Stool is present within the proximal colon, reassuring finding after ileostomy takedown. Negative for pneumoperitoneum.  IMPRESSION: Negative abdominal radiographs.  No evidence of pneumonia.   Electronically Signed   By: Jorje Guild M.D.   On: 07/05/2013 23:28    Review of Systems  Constitutional: Negative for fever, chills and diaphoresis.  HENT: Negative for sore throat and trouble swallowing.   Eyes: Negative for photophobia  and visual disturbance.  Respiratory: Negative for choking and shortness of breath.   Cardiovascular: Negative for chest pain and palpitations.  Gastrointestinal: Negative for nausea, vomiting, abdominal distention, anal bleeding and rectal pain.  Genitourinary: Negative for dysuria, urgency, difficulty urinating and testicular pain.  Musculoskeletal: Negative for arthralgias, gait problem, myalgias and neck pain.  Skin: Negative for color change and rash.  Neurological: Negative for dizziness, speech difficulty, weakness and numbness.  Hematological: Negative for adenopathy.  Psychiatric/Behavioral: Negative for hallucinations, confusion and agitation.       Objective:   Physical Exam  Constitutional: He is oriented to person, place, and time. He appears well-developed and well-nourished. No distress.  HENT:  Head: Normocephalic.  Mouth/Throat: Oropharynx is clear and moist. No oropharyngeal exudate.  Eyes: Conjunctivae and EOM are normal. Pupils are equal, round, and reactive to light. No scleral icterus.  Neck: Normal range of motion. No tracheal deviation present.  Cardiovascular: Normal rate, normal heart sounds and intact distal pulses.   Pulmonary/Chest: Effort normal. No respiratory distress.  Abdominal: Soft. He exhibits no distension. There is no tenderness. No hernia. Hernia confirmed negative in the ventral area, confirmed negative in the right inguinal area and confirmed negative in the left inguinal area.    Incisions clean with normal healing ridges.  No hernias  Genitourinary:  Exam done with assistance of male Medical Assistant in the room.  Perianal skin clean with good hygiene.    No pilonidal disease.  No fissure.  No abscess/fistula.  No external hemorrhoids. Tolerates digital rectal exam but sensitive  Moderate anal pruritis.  Stricture rectal and anal anastomosis 2 cm from anal verge.  Does tolerate index finger.  Gently dilated to 2 cm diameter.  Some  discomfort but tolerated well.  Musculoskeletal: Normal range of motion. He exhibits no tenderness.  Arms: Neurological: He is alert and oriented to person, place, and time. No cranial nerve deficit. He exhibits normal muscle tone. Coordination normal.  Skin: Skin is warm and dry. No rash noted. He is not diaphoretic.  Psychiatric: He has a normal mood and affect. His behavior is normal.       Assessment:     Gradually recovering from ileostomy takedown complicated by endocarditis with bacteremia   Stricture a coloanal anastomosis but no obstruction the    Plan:     Increase activity as tolerated to regular activity.  Low impact exercise such as walking an hour a day at least ideal.  Do not push through pain.  Diet as tolerated.  Low fat high fiber diet ideal.  Bowel regimen with 30 g fiber a day and fiber supplement as needed to avoid problems.  If he has persistent diarrhea, he may need to have C. Difficile checked again.  I am going under the assumption that it is some urgency today from stress and recently weaning off narcotics.  Continue Kegel pelvic floor exercises to help strengthen sphincter.    I again discussed with the patient reconsider pelvic floor physical therapy to help with encopresis and dilating the coloanal stricture.  One he complained, and he wondered if he can wiggle longer.  I told him he told me that last time and I pushed a little more strongly to consider at least meeting her.  He agreed.  Return to clinic in 3 weeks to make sure that he is improving, sooner as needed.   Instructions discussed.  Followup with primary care physician for other health issues as would normally be done.  Consider screening for malignancies (breast, prostate, colon, melanoma, etc) as appropriate.  Questions answered.  The patient & son expressed understanding and appreciation

## 2013-09-24 ENCOUNTER — Telehealth (INDEPENDENT_AMBULATORY_CARE_PROVIDER_SITE_OTHER): Payer: Self-pay

## 2013-09-24 DIAGNOSIS — C20 Malignant neoplasm of rectum: Secondary | ICD-10-CM

## 2013-09-24 DIAGNOSIS — R32 Unspecified urinary incontinence: Secondary | ICD-10-CM

## 2013-09-24 DIAGNOSIS — K624 Stenosis of anus and rectum: Secondary | ICD-10-CM

## 2013-09-24 NOTE — Telephone Encounter (Signed)
Called pt's son so they could notify pt that I did place the referral for Pelvic floor PT. PT will contact pt to set up an appt with him. I tried calling pt to give him this information but there was no answer at home. I want the pt to be made aware so he will be expecting a call from PT.

## 2013-10-02 ENCOUNTER — Ambulatory Visit: Payer: Medicare Other | Attending: Surgery | Admitting: Physical Therapy

## 2013-10-02 DIAGNOSIS — IMO0001 Reserved for inherently not codable concepts without codable children: Secondary | ICD-10-CM | POA: Insufficient documentation

## 2013-10-02 DIAGNOSIS — K624 Stenosis of anus and rectum: Secondary | ICD-10-CM | POA: Insufficient documentation

## 2013-10-02 DIAGNOSIS — C20 Malignant neoplasm of rectum: Secondary | ICD-10-CM | POA: Insufficient documentation

## 2013-10-02 DIAGNOSIS — M629 Disorder of muscle, unspecified: Secondary | ICD-10-CM | POA: Insufficient documentation

## 2013-10-02 DIAGNOSIS — M2420 Disorder of ligament, unspecified site: Secondary | ICD-10-CM | POA: Insufficient documentation

## 2013-10-08 ENCOUNTER — Ambulatory Visit: Payer: Medicare Other | Attending: Surgery | Admitting: Physical Therapy

## 2013-10-08 DIAGNOSIS — M629 Disorder of muscle, unspecified: Secondary | ICD-10-CM | POA: Insufficient documentation

## 2013-10-08 DIAGNOSIS — IMO0001 Reserved for inherently not codable concepts without codable children: Secondary | ICD-10-CM | POA: Insufficient documentation

## 2013-10-08 DIAGNOSIS — M2420 Disorder of ligament, unspecified site: Secondary | ICD-10-CM | POA: Insufficient documentation

## 2013-10-08 DIAGNOSIS — R152 Fecal urgency: Secondary | ICD-10-CM | POA: Insufficient documentation

## 2013-10-08 DIAGNOSIS — C20 Malignant neoplasm of rectum: Secondary | ICD-10-CM | POA: Insufficient documentation

## 2013-10-08 DIAGNOSIS — K624 Stenosis of anus and rectum: Secondary | ICD-10-CM | POA: Insufficient documentation

## 2013-10-08 DIAGNOSIS — R32 Unspecified urinary incontinence: Secondary | ICD-10-CM | POA: Insufficient documentation

## 2013-10-09 ENCOUNTER — Encounter: Payer: Medicare Other | Admitting: Physical Therapy

## 2013-10-16 ENCOUNTER — Encounter (INDEPENDENT_AMBULATORY_CARE_PROVIDER_SITE_OTHER): Payer: Self-pay | Admitting: Surgery

## 2013-10-16 ENCOUNTER — Ambulatory Visit (INDEPENDENT_AMBULATORY_CARE_PROVIDER_SITE_OTHER): Payer: Medicare Other | Admitting: Surgery

## 2013-10-16 VITALS — BP 148/82 | HR 80 | Temp 97.8°F | Resp 14 | Wt 187.0 lb

## 2013-10-16 DIAGNOSIS — K624 Stenosis of anus and rectum: Secondary | ICD-10-CM

## 2013-10-16 DIAGNOSIS — C20 Malignant neoplasm of rectum: Secondary | ICD-10-CM

## 2013-10-16 NOTE — Patient Instructions (Signed)
Please consider the recommendations that we have given you today:  Continue using anal dilators to stretch out stricture at coloanal anastomosis.  Continue MiraLAX bowel regimen to help thicken and soften stools.  Imodium as needed.  Continue pelvic floor exercises in therapy to help improve your bodies ability to handle defecation  See the Handout(s) we have given you.  Please call our office at [phone] if you wish to schedule surgery or if you have further questions / concerns.   Pelvic floor muscle training exercises  can help strengthen the muscles under the uterus, bladder, and bowel (large intestine). They can help both men and women who have problems with urine leakage or bowel control.  A pelvic floor muscle training exercise is like pretending that you have to urinate, and then holding it. You relax and tighten the muscles that control urine flow. It's important to find the right muscles to tighten.  The next time you have to urinate, start to go and then stop. Feel the muscles in your vagina, bladder, or anus get tight and move up. These are the pelvic floor muscles. If you feel them tighten, you've done the exercise right. If you are still not sure whether you are tightening the right muscles, keep in mind that all of the muscles of the pelvic floor relax and contract at the same time. Because these muscles control the bladder, rectum, and vagina, the following tips may help: Women: Insert a finger into your vagina. Tighten the muscles as if you are holding in your urine, then let go. You should feel the muscles tighten and move up and down.  Men: Insert a finger into your rectum. Tighten the muscles as if you are holding in your urine, then let go. You should feel the muscles tighten and move up and down. These are the same muscles you would tighten if you were trying to prevent yourself from passing gas.  It is very important that you keep the following muscles relaxed while  doing pelvic floor muscle training exercises: Abdominal  Buttocks (the deeper, anal sphincter muscle should contract)  Thigh   A woman can also strengthen these muscles by using a vaginal cone, which is a weighted device that is inserted into the vagina. Then you try to tighten the pelvic floor muscles to hold the device in place. If you are unsure whether you are doing the pelvic floor muscle training correctly, you can use biofeedback and electrical stimulation to help find the correct muscle group to work. Biofeedback is a method of positive reinforcement. Electrodes are placed on the abdomen and along the anal area. Some therapists place a sensor in the vagina in women or anus in men to monitor the contraction of pelvic floor muscles.  A monitor will display a graph showing which muscles are contracting and which are at rest. The therapist can help find the right muscles for performing pelvic floor muscle training exercises.   PERFORMING PELVIC FLOOR EXERCISES: 1. Begin by emptying your bladder. 2. Tighten the pelvic floor muscles and hold for a count of 10. 3. Relax the muscles completely for a count of 10. 4. Do 10 repititions, 3 to 5 times a day (morning, afternoon, and night). You can do these exercises at any time and any place. Most people prefer to do the exercises while lying down or sitting in a chair. After 4 - 6 weeks, most people notice some improvement. It may take as long as 3 months to see  a major change. After a couple of weeks, you can also try doing a single pelvic floor contraction at times when you are likely to leak (for example, while getting out of a chair). A word of caution: Some people feel that they can speed up the progress by increasing the number of repetitions and the frequency of exercises. However, over-exercising can instead cause muscle fatigue and increase urine leakage. If you feel any discomfort in your abdomen or back while doing these exercises, you are  probably doing them wrong. Breathe deeply and relax your body when you are doing these exercises. Make sure you are not tightening your stomach, thigh, buttock, or chest muscles. When done the right way, pelvic floor muscle exercises have been shown to be very effective at improving urinary continence. Alternative Names Kegel exercises  GETTING TO [place]. Irregular bowel habits such as constipation and diarrhea can lead to many problems over time.  Having one soft bowel movement a day is the most important way to prevent further problems.  The anorectal canal is designed to handle stretching and feces to safely manage our ability to get rid of solid waste (feces, poop, stool) out of our body.  BUT, hard constipated stools can act like ripping concrete bricks and diarrhea can be a burning fire to this very sensitive area of our body, causing inflamed hemorrhoids, anal fissures, increasing risk is perirectal abscesses, abdominal pain/bloating, an making irritable bowel worse.     The goal: ONE SOFT BOWEL MOVEMENT A DAY!  To have soft, regular bowel movements:    Drink at least 8 tall glasses of water a day.     Take plenty of fiber.  Fiber is the undigested part of plant food that passes into the colon, acting s "natures broom" to encourage bowel motility and movement.  Fiber can absorb and hold large amounts of water. This results in a larger, bulkier stool, which is soft and easier to pass. Work gradually over several weeks up to 6 servings a day of fiber (25g a day even more if needed) in the form of: o Vegetables -- Root (potatoes, carrots, turnips), leafy green (lettuce, salad greens, celery, spinach), or cooked high residue (cabbage, broccoli, etc) o Fruit -- Fresh (unpeeled skin & pulp), Dried (prunes, apricots, cherries, etc ),  or stewed ( applesauce)  o Whole grain breads, pasta, etc (whole wheat)  o Bran cereals    Bulking Agents -- This type of water-retaining fiber generally is  easily obtained each day by one of the following:  o Psyllium bran -- The psyllium plant is remarkable because its ground seeds can retain so much water. This product is available as Metamucil, Konsyl, Effersyllium, Per Diem Fiber, or the less expensive generic preparation in drug and health food stores. Although labeled a laxative, it really is not a laxative.  o Methylcellulose -- This is another fiber derived from wood which also retains water. It is available as Citrucel. o Polyethylene Glycol - and "artificial" fiber commonly called Miralax or Glycolax.  It is helpful for people with gassy or bloated feelings with regular fiber o Flax Seed - a less gassy fiber than psyllium   No reading or other relaxing activity while on the toilet. If bowel movements take longer than 5 minutes, you are too constipated   AVOID CONSTIPATION.  High fiber and water intake usually takes care of this.  Sometimes a laxative is needed to stimulate more frequent bowel movements, but  Laxatives are not a good long-term solution as it can wear the colon out. o Osmotics (Milk of Magnesia, Fleets phosphosoda, Magnesium citrate, MiraLax, GoLytely) are safer than  o Stimulants (Senokot, Castor Oil, Dulcolax, Ex Lax)    o Do not take laxatives for more than 7days in a row.    IF SEVERELY CONSTIPATED, try a Bowel Retraining Program: o Do not use laxatives.  o Eat a diet high in roughage, such as bran cereals and leafy vegetables.  o Drink six (6) ounces of prune or apricot juice each morning.  o Eat two (2) large servings of stewed fruit each day.  o Take one (1) heaping tablespoon of a psyllium-based bulking agent twice a day. Use sugar-free sweetener when possible to avoid excessive calories.  o Eat a normal breakfast.  o Set aside 15 minutes after breakfast to sit on the toilet, but do not strain to have a bowel movement.  o If you do not have a bowel movement by the third day, use an enema and repeat the above steps.     Controlling diarrhea o Switch to liquids and simpler foods for a few days to avoid stressing your intestines further. o Avoid dairy products (especially milk & ice cream) for a short time.  The intestines often can lose the ability to digest lactose when stressed. o Avoid foods that cause gassiness or bloating.  Typical foods include beans and other legumes, cabbage, broccoli, and dairy foods.  Every person has some sensitivity to other foods, so listen to our body and avoid those foods that trigger problems for you. o Adding fiber (Citrucel, Metamucil, psyllium, Miralax) gradually can help thicken stools by absorbing excess fluid and retrain the intestines to act more normally.  Slowly increase the dose over a few weeks.  Too much fiber too soon can backfire and cause cramping & bloating. o Probiotics (such as active yogurt, Align, etc) may help repopulate the intestines and colon with normal bacteria and calm down a sensitive digestive tract.  Most studies show it to be of mild help, though, and such products can be costly. o Medicines:   Bismuth subsalicylate (ex. Kayopectate, Pepto Bismol) every 30 minutes for up to 6 doses can help control diarrhea.  Avoid if pregnant.   Loperamide (Immodium) can slow down diarrhea.  Start with two tablets (4mg  total) first and then try one tablet every 6 hours.  Avoid if you are having fevers or severe pain.  If you are not better or start feeling worse, stop all medicines and call your doctor for advice o Call your doctor if you are getting worse or not better.  Sometimes further testing (cultures, endoscopy, X-ray studies, bloodwork, etc) may be needed to help diagnose and treat the cause of the diarrhea. o

## 2013-10-16 NOTE — Progress Notes (Signed)
Subjective:     Patient ID: Tyler Diaz, male   DOB: 1945-09-01, 68 y.o.   MRN: [id number]  HPI   Note: This dictation was prepared with Dragon/digital dictation along with [employer]. Any transcriptional errors that result from this process are unintentional.       Tyler Diaz  August 31, 1945 [id number]  Patient Care Team: Chipper Herb, MD as PCP - General (Family Medicine) Adin Hector, MD as Consulting Physician (Colon and Rectal Surgery) Lear Ng, MD as Consulting Physician (Gastroenterology) Gatha Mayer, MD as Consulting Physician (Radiation Oncology) Carola Frost, RN as Registered Nurse (Oncology) [name], MD as Consulting Physician (Oncology) Carlyle Basques, MD as Consulting Physician (Infectious Diseases)  Procedure (Date: 06/28/2013):  POST-OPERATIVE DIAGNOSIS:  Rectal cancer status post low anterior resection, coloanal anastomosis, diverting loop ileostomy.  Stricture of coloanal anastomosis.   PROCEDURE: Procedure(s):  loop ILEOSTOMY TAKEDOWN  Dilation of coloanal anastomosis  Examination of anesthesia   SURGEON: Surgeon(s):  Adin Hector, MD  Edward Jolly, MD - Asst   This patient returns for surgical re-evaluation.  He is gradually recovering.  He saw the patella for a physical therapist.  She recommended using an anal dilator and gradually increasing caliber.  He made his own.  He put a rubber glove on it.  That seems to help.  Still some fecal urgency controlled with Imodium.  Improving.  Using MiraLAX for constipation.  Eating better.  Weight stable.  Energy level better.  In good spirits.  No fevers or chills.  No rectal bleeding.  Patient Active Problem List   Diagnosis Date Noted  . Pruritus ani 09/19/2013  . Diarrhea 08/28/2013  . Sepsis 08/17/2013  . Constipation, chronic 08/16/2013  . Bacterial endocarditis 07/06/2013  . Stricture of coloanal anastomosis 04/17/2013  . Anemia in neoplastic disease  01/14/2013  . Bilateral inguinal hernia (BIH) 01/03/2013  . Mitral regurgitation due to partially flail posterior mitral valve leaflet 07/30/2012  . Hyperlipidemia   . Rectal cancer - distal s/p lap LAR/coloanal July 2014 - [id number]. 07/27/2012  . Hypertension   . Vertigo     Past Medical History  Diagnosis Date  . Mitral regurgitation due to partially flail posterior mitral valve leaflet   . Rectal cancer   . Hyperlipidemia   . Colon cancer 07/18/12 bx    rectum=Invasive adenocarcinoma w/ extracellular mucin,polyp=benign  . Rheumatic fever as child    hx  . Hearing loss     partial greater on left  . Tinnitus     left ear  . Sinusitis     seasonal  . Arthritis     hands/knees  . Hematochezia     recent  . Heart murmur   . Hypothyroidism as child    hx of  . Numbness     TOES / FINGERS DUE TO CHEMO  . History of shingles   . Blood clot in vein     RT ARM WITH PICC LINE   . GERD (gastroesophageal reflux disease)     OCCASIONAL  . Nocturia   . Radiation     FOR COLON CANCER  . Umbilical hernia, incarcerated - s/p primary repair July 2014 01/03/2013  . Herpes zoster - R flank - with severe pain 03/11/2013  . Ileostomy - diverting loop - s/p takedown 06/28/2013 01/14/2013  . C. difficile colitis 08/19/2013    Past Surgical History  Procedure Laterality Date  . Carpal tunnel release Right 2002  .  Tonsillectomy      as child  . Laparoscopic low anterior resection N/A 12/27/2012    Procedure: LAPAROSCOPIC LOW ANTERIOR RESECTION, COLO-ANAL ANASTOMOSIS, DIVERTING LOOP ILEOSTOMY,SPLENIC FLEXURE MOBILIZATION, PRIMARY INCARCERATED UMBILICAL HERNIA REPAIR;  Surgeon: Ardeth Sportsman, MD;  Location: [facility];  Service: General;  Laterality: N/A;  . Laparoscopic low anterior rescection with coloanal anastomosis N/A 12/27/2012    Procedure: LAPAROSCOPIC LOW ANTERIOR RESCECTION WITH COLOANAL ANASTOMOSIS;  Surgeon: Ardeth Sportsman, MD;  Location: [facility];  Service: General;  Laterality:  N/A;  . Ileostomy N/A 12/27/2012    Procedure: DIVERTING LOOP ILEOSTOMY;  Surgeon: Ardeth Sportsman, MD;  Location: [facility];  Service: General;  Laterality: N/A;  . Umbilical hernia repair  12/27/2012    Procedure: PRIMARY REPAIR INCARCERATED UMBILICAL HERNIA;  Surgeon: Ardeth Sportsman, MD;  Location: [facility];  Service: General;;  . Hernia repair    . Ileostomy closure N/A 06/28/2013    Procedure: loop ILEOSTOMY TAKEDOWN examination of anesthesia ;  Surgeon: Ardeth Sportsman, MD;  Location: [facility];  Service: General;  Laterality: N/A;  . Tee without cardioversion N/A 07/10/2013    Procedure: TRANSESOPHAGEAL ECHOCARDIOGRAM (TEE);  Surgeon: Donato Schultz, MD;  Location: [facility] ENDOSCOPY;  Service: Cardiovascular;  Laterality: N/A;  . Tee without cardioversion N/A 08/21/2013    Procedure: TRANSESOPHAGEAL ECHOCARDIOGRAM (TEE);  Surgeon: Thurmon Fair, MD;  Location: [facility];  Service: Cardiovascular;  Laterality: N/A;    History   Social History  . Marital Status: Married    Spouse Name: N/A    Number of Children: 2  . Years of Education: N/A   Occupational History  . Retired     Development worker, [employer]   Social History Main Topics  . Smoking status: Former Smoker -- 1.50 packs/day for 20 years    Types: Cigarettes    Quit date: 07/27/1972  . Smokeless tobacco: Never Used     Comment: quit smoking 35 years ago  . Alcohol Use: No  . Drug Use: No  . Sexual Activity: Not on file   Other Topics Concern  . 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  Married    Family History  Problem Relation Age of Onset  . Cancer Mother     abdominal ca ?ovarian?  . Cancer Sister     liver cancer    Current Outpatient Prescriptions  Medication Sig Dispense Refill  . acetaminophen (TYLENOL) 500 MG tablet Take 500 mg by mouth every 6 (six) hours as needed for headache.      Marland Kitchen acetaminophen-codeine (TYLENOL #3) 300-30 MG per tablet       . diazepam  (VALIUM) 2 MG tablet Take 2 mg by mouth at bedtime as needed for sedation.      . ferrous sulfate 325 (65 FE) MG tablet Take 1 tablet (325 mg total) by mouth 2 (two) times daily with a meal.  60 tablet  1  . gabapentin (NEURONTIN) 100 MG capsule Take 1 capsule (100 mg total) by mouth 3 (three) times daily.  90 capsule  3  . hydrocortisone (PROCTOSOL HC) 2.5 % rectal cream Place 1 application rectally 2 (two) times daily.  30 g  0  . lisinopril (PRINIVIL,ZESTRIL) 10 MG tablet       . Loperamide HCl (IMODIUM PO) Take 1 tablet by mouth. Took only to go to MD appointment 3/24      . Multiple Vitamin (MULTIVITAMIN WITH  MINERALS) TABS Take 1 tablet by mouth every morning.       Marland Kitchen oxymetazoline (AFRIN) 0.05 % nasal spray Place 1 spray into both nostrils 2 (two) times daily as needed for congestion.      . polyethylene glycol (MIRALAX / GLYCOLAX) packet Take 17 g by mouth daily.      . pravastatin (PRAVACHOL) 20 MG tablet       . promethazine (PHENERGAN) 25 MG tablet Take 25 mg by mouth every 6 (six) hours as needed for nausea.       Marland Kitchen saccharomyces boulardii (FLORASTOR) 250 MG capsule Take 1 capsule (250 mg total) by mouth 2 (two) times daily.       No current facility-administered medications for this visit.     Allergies  Allergen Reactions  . Shrimp [Shellfish Allergy] Hives, Nausea And Vomiting and Rash    BP 148/82  Pulse 80  Temp(Src) 97.8 F (36.6 C) (Temporal)  Resp 14  Wt 187 lb (84.823 kg)  Ct Abdomen Pelvis W Contrast  07/07/2013   CLINICAL DATA:  History of rectal carcinoma. Status post recent ileostomy take-down. Positive blood cultures. Normal white blood cell count. Patient was chills and fever. Patient has positive blood cultures.  EXAM: CT ABDOMEN AND PELVIS WITH CONTRAST  TECHNIQUE: Multidetector CT imaging of the abdomen and pelvis was performed using the standard protocol following bolus administration of intravenous contrast.  CONTRAST:  46mL OMNIPAQUE IOHEXOL 300 MG/ML  SOLN, OMNIPAQUE IOHEXOL 300 MG/ML SOLN  COMPARISON:  08/02/2012  FINDINGS: There is irregular fluid attenuation associated with hazy and reticular inflammatory change in the subcutaneous soft tissues of the right lower quadrant. The irregular fluid measures approximately 3.6 cm x 2.2 cm x 0.5 cm. This may reflect a small area of sterile postoperative fluid with associated edema. An early abscess is possible.  There is presacral edema and mild stranding in the fat of the pelvis that is all felt to be post surgical in origin. There is no intra-abdominal collection/abscess.  The appendix is dilated to 9 mm. There is no significant associated inflammatory change. This could potentially reflect an early acute appendicitis, but is not conclusive.  The ileal anastomosis in the right lower quadrant is unremarkable. No bowel wall thickening or inflammatory change. No bowel dilation to suggest obstruction or significant ileus.  Minimal lung base subsegmental atelectasis. Lung bases are otherwise clear.  Liver and spleen are unremarkable.  Multiple gallstones. No evidence of acute cholecystitis. No bile duct dilation. Normal pancreas. No adrenal masses.  Small renal low-density lesions, likely cysts. Kidneys are otherwise unremarkable. Normal ureters. Normal bladder. Prostate is enlarged but stable.  No pathologically enlarged lymph nodes.  No ascites.  IMPRESSION: 1. There are 2 potential sources for positive blood cultures. 2. There is a small irregular fluid collection within the subcutaneous fat of the right lower quadrant that could reflect a poorly defined abscess with associated cellulitis. It may simply reflect sterile postoperative fluid and edema. 3. The appendix is dilated 9 mm. This could reflect an early acute appendicitis but is not conclusive. 4. There is no intra-abdominal or pelvic abscess. Mild pelvic fat stranding and presacral edema is consistent with the expected postsurgical change. 5. No evidence  of bowel inflammation or obstruction. No adynamic ileus. 6. Other chronic findings as described are stable from the prior CT.   Electronically Signed   By: Amie Portland M.D.   On: 07/07/2013 09:00   Dg Abd Acute W/chest  07/05/2013  CLINICAL DATA:  Fever after recent ileostomy take-down  EXAM: ACUTE ABDOMEN SERIES (ABDOMEN 2 VIEW & CHEST 1 VIEW)  COMPARISON:  08/02/2012 chest CT  FINDINGS: Normal heart size and mediastinal contours. No acute infiltrate or edema. No effusion or pneumothorax. There is minimal atelectasis or scar at the peripheral left base. Previously noted pulmonary nodules are not visible. No acute osseous findings.  No gas dilated bowel. No abnormal fluid levels. Stool is present within the proximal colon, reassuring finding after ileostomy takedown. Negative for pneumoperitoneum.  IMPRESSION: Negative abdominal radiographs.  No evidence of pneumonia.   Electronically Signed   By: Jorje Guild M.D.   On: 07/05/2013 23:28    Review of Systems  Constitutional: Negative for fever, chills and diaphoresis.  HENT: Negative for sore throat and trouble swallowing.   Eyes: Negative for photophobia and visual disturbance.  Respiratory: Negative for choking and shortness of breath.   Cardiovascular: Negative for chest pain and palpitations.  Gastrointestinal: Negative for nausea, vomiting, abdominal distention, anal bleeding and rectal pain.  Genitourinary: Negative for dysuria, urgency, difficulty urinating and testicular pain.  Musculoskeletal: Negative for arthralgias, gait problem, myalgias and neck pain.  Skin: Negative for color change and rash.  Neurological: Negative for dizziness, speech difficulty, weakness and numbness.  Hematological: Negative for adenopathy.  Psychiatric/Behavioral: Negative for hallucinations, confusion and agitation.       Objective:   Physical Exam  Constitutional: He is oriented to person, place, and time. He appears well-developed and  well-nourished. No distress.  HENT:  Head: Normocephalic.  Mouth/Throat: Oropharynx is clear and moist. No oropharyngeal exudate.  Eyes: Conjunctivae and EOM are normal. Pupils are equal, round, and reactive to light. No scleral icterus.  Neck: Normal range of motion. No tracheal deviation present.  Cardiovascular: Normal rate, normal heart sounds and intact distal pulses.   Pulmonary/Chest: Effort normal. No respiratory distress.  Abdominal: Soft. He exhibits no distension. There is no tenderness. No hernia. Hernia confirmed negative in the ventral area, confirmed negative in the right inguinal area and confirmed negative in the left inguinal area.    Incisions clean with normal healing ridges.  No hernias  Musculoskeletal: Normal range of motion. He exhibits no tenderness.  Neurological: He is alert and oriented to person, place, and time. No cranial nerve deficit. He exhibits normal muscle tone. Coordination normal.  Skin: Skin is warm and dry. No rash noted. He is not diaphoretic.  Psychiatric: He has a normal mood and affect. His behavior is normal.       Assessment:     Gradually recovering from ileostomy takedown   Stricture a coloanal anastomosis but no obstruction    Plan:     Increase activity as tolerated to regular activity.  Low impact exercise such as walking an hour a day at least ideal.  Do not push through pain.  Diet as tolerated.  Low fat high fiber diet ideal.  Bowel regimen with 30 g fiber a day and fiber supplement as needed to avoid problems.  Miralax with PRN immodium seems to be working.  Continue anal dilators for stricture at colon anastomosis  Continue Kegel pelvic floor exercises to help strengthen sphincter & Pelvic floor PT  Return to clinic in 3 months to evaluate anal stricture & recurrence, sooner as needed.   Instructions discussed.  Followup with primary care physician for other health issues as would normally be done.  Consider screening for  malignancies (breast, prostate, colon, melanoma, etc) as appropriate.  Questions answered.  The patient & son expressed understanding and appreciation

## 2013-10-17 ENCOUNTER — Other Ambulatory Visit (HOSPITAL_BASED_OUTPATIENT_CLINIC_OR_DEPARTMENT_OTHER): Payer: Medicare Other

## 2013-10-17 ENCOUNTER — Telehealth: Payer: Self-pay | Admitting: Oncology

## 2013-10-17 ENCOUNTER — Ambulatory Visit (HOSPITAL_BASED_OUTPATIENT_CLINIC_OR_DEPARTMENT_OTHER): Payer: Medicare Other | Admitting: Oncology

## 2013-10-17 VITALS — BP 142/83 | HR 63 | Temp 97.0°F | Resp 18 | Ht 70.0 in | Wt 187.1 lb

## 2013-10-17 DIAGNOSIS — C20 Malignant neoplasm of rectum: Secondary | ICD-10-CM

## 2013-10-17 LAB — CEA: CEA: 0.8 ng/mL (ref 0.0–5.0)

## 2013-10-17 MED ORDER — ACETAMINOPHEN-CODEINE #3 300-30 MG PO TABS: 1.0000 | ORAL_TABLET | Freq: Three times a day (TID) | ORAL | Status: DC | PRN

## 2013-10-17 NOTE — Progress Notes (Signed)
  [facility] OFFICE PROGRESS NOTE   Diagnosis: Rectal cancer  INTERVAL HISTORY:   He returns as scheduled. He feels well. Tyler Diaz has episodes of rectal incontinence. He is undergoing pelvic physical therapy.  Tyler Diaz no longer takes pain medication. He has recovered from the endocarditis. He continues to have numbness in the fingers and toes. This has improved. Objective:  Vital signs in last 24 hours:  Blood pressure 142/83, pulse 63, temperature 97 F (36.1 C), temperature source Oral, resp. rate 18, height $RemoveBe'5\' 10"'yVRTYwack$  (1.778 m), weight 187 lb 1.6 oz (84.868 kg), SpO2 98.00%.    HEENT: Neck without mass Lymphatics: No cervical, supraclavicular, axillary, or inguinal nodes Resp: Lungs clear bilaterally Cardio: Regular rate and rhythm GI: No hepatomegaly, nontender, no mass Vascular: No leg edema   Lab Results:  Lab Results  Component Value Date   CEA 1.2 05/21/2013    Imaging:  No results found.  Medications: I have reviewed the patient's current medications.  Assessment/Plan: 1. Stage III rectal cancer, presenting with a locally advanced rectal tumor.  CT/MRI evidence of perirectal lymphadenopathy.  Status post neoadjuvant Xeloda and radiation.  Status post low anterior resection and coloanal anastomosis 12/27/2012 with pathology confirming a ypT3, ypN1b tumor, microsatellite stable.  Initiation of adjuvant CAPOX chemotherapy 01/30/2013.  Cycle 2 adjuvant CAPOX 02/20/2013.  Cycle 3 adjuvant CAPOX 03/13/2013.  Cycle 4 adjuvant CAPOX 04/03/2013 (oxaliplatin dose reduced with cycle 4 due to neuropathy, Xeloda dose reduced with cycle 4 due to hand-foot syndrome).  Cycle 5 adjuvant CAPOX 04/24/2013. 2. Small bilateral lung nodules and a mildly enlarged gastrohepatic node on the initial staging CT scans 08/02/2012.  CT chest on 01/23/2013 with stable bilateral pulmonary nodules. 3. Elevated pretreatment CEA.  Repeat CEA 1.2 on 05/21/2013. 4. Mitral  regurgitation. 5. Hypertension. 6. History of rheumatic fever. 7. Pain and swelling at the right arm on 01/18/2013. Doppler confirmed a right upper extremity deep vein thrombosis.  Daily Lovenox started on 01/18/2013.  8. PICC and Lovenox discontinued after an office visit 05/21/2013.  9. Acute herpes zoster affecting right thoracic dermatomes September 2014. He completed a course of valacyclovir. 10. Pain related to herpes zoster. Resolved. 11. Delayed nausea following cycle 1 CAPOX. Aloxi added to the premedication regimen beginning with cycle 2. 12. Diarrhea following cycle 2 CAPOX. 13. History of hand/foot syndrome secondary to Xeloda. 14. Ileostomy takedown procedure 06/28/2013. 15. Enterococcus bacteremia, probable endocarditis 07/05/2013.  16. PICC line placement for IV antibiotics 07/11/2013. 57. C. difficile colitis March 2015   Disposition:  Tyler Diaz remains in clinical remission from rectal cancer. We will followup on the CEA from today. He continues pelvic physical therapy. Tyler Diaz will return for an office visit and CEA in 6 months. He had a CT of the abdomen and pelvis in February of 2015. We will consider scheduling a surveillance CT when he returns in 6 months.  [name], MD  10/17/2013  10:25 AM

## 2013-10-17 NOTE — Telephone Encounter (Signed)
gv adn printed appt sched and avs for pt for NOV °

## 2013-10-18 ENCOUNTER — Telehealth: Payer: Self-pay | Admitting: *Deleted

## 2013-10-18 NOTE — Telephone Encounter (Signed)
Message copied by Brien Few on Fri Oct 18, 2013  3:29 PM ------      Message from: [name]      Created: Fri Oct 18, 2013  2:47 PM       Please call patient, cea is normal ------

## 2013-10-18 NOTE — Telephone Encounter (Signed)
Called pt with normal CEA results. He voiced understanding. 

## 2013-11-05 ENCOUNTER — Ambulatory Visit: Payer: Medicare Other | Attending: Surgery | Admitting: Physical Therapy

## 2013-11-05 DIAGNOSIS — C20 Malignant neoplasm of rectum: Secondary | ICD-10-CM | POA: Insufficient documentation

## 2013-11-05 DIAGNOSIS — IMO0001 Reserved for inherently not codable concepts without codable children: Secondary | ICD-10-CM | POA: Insufficient documentation

## 2013-11-05 DIAGNOSIS — M629 Disorder of muscle, unspecified: Secondary | ICD-10-CM | POA: Insufficient documentation

## 2013-11-05 DIAGNOSIS — R32 Unspecified urinary incontinence: Secondary | ICD-10-CM | POA: Insufficient documentation

## 2013-11-05 DIAGNOSIS — M2420 Disorder of ligament, unspecified site: Secondary | ICD-10-CM | POA: Insufficient documentation

## 2013-11-05 DIAGNOSIS — K624 Stenosis of anus and rectum: Secondary | ICD-10-CM | POA: Insufficient documentation

## 2013-11-05 DIAGNOSIS — R152 Fecal urgency: Secondary | ICD-10-CM | POA: Insufficient documentation

## 2013-12-10 ENCOUNTER — Ambulatory Visit: Payer: Medicare Other | Attending: Surgery | Admitting: Physical Therapy

## 2013-12-10 DIAGNOSIS — R32 Unspecified urinary incontinence: Secondary | ICD-10-CM | POA: Insufficient documentation

## 2013-12-10 DIAGNOSIS — R152 Fecal urgency: Secondary | ICD-10-CM | POA: Insufficient documentation

## 2013-12-10 DIAGNOSIS — M629 Disorder of muscle, unspecified: Secondary | ICD-10-CM | POA: Insufficient documentation

## 2013-12-10 DIAGNOSIS — IMO0001 Reserved for inherently not codable concepts without codable children: Secondary | ICD-10-CM | POA: Insufficient documentation

## 2013-12-10 DIAGNOSIS — C20 Malignant neoplasm of rectum: Secondary | ICD-10-CM | POA: Diagnosis not present

## 2013-12-10 DIAGNOSIS — M2420 Disorder of ligament, unspecified site: Secondary | ICD-10-CM | POA: Insufficient documentation

## 2013-12-10 DIAGNOSIS — K624 Stenosis of anus and rectum: Secondary | ICD-10-CM | POA: Diagnosis not present

## 2014-01-14 ENCOUNTER — Telehealth (INDEPENDENT_AMBULATORY_CARE_PROVIDER_SITE_OTHER): Payer: Self-pay

## 2014-01-14 ENCOUNTER — Ambulatory Visit (INDEPENDENT_AMBULATORY_CARE_PROVIDER_SITE_OTHER): Payer: Medicare Other | Admitting: Surgery

## 2014-01-14 VITALS — BP 140/82 | HR 76 | Temp 97.6°F | Ht 70.0 in | Wt 182.0 lb

## 2014-01-14 DIAGNOSIS — C20 Malignant neoplasm of rectum: Secondary | ICD-10-CM

## 2014-01-14 LAB — COMPREHENSIVE METABOLIC PANEL
ALBUMIN: 4.2 g/dL (ref 3.5–5.2)
ALT: 40 U/L (ref 0–53)
AST: 34 U/L (ref 0–37)
Alkaline Phosphatase: 61 U/L (ref 39–117)
BUN: 12 mg/dL (ref 6–23)
CALCIUM: 9.7 mg/dL (ref 8.4–10.5)
CO2: 23 meq/L (ref 19–32)
Chloride: 104 mEq/L (ref 96–112)
Creat: 0.9 mg/dL (ref 0.50–1.35)
GLUCOSE: 85 mg/dL (ref 70–99)
POTASSIUM: 4.6 meq/L (ref 3.5–5.3)
Sodium: 143 mEq/L (ref 135–145)
TOTAL PROTEIN: 7.1 g/dL (ref 6.0–8.3)
Total Bilirubin: 1 mg/dL (ref 0.2–1.2)

## 2014-01-14 LAB — HEMOGLOBIN: HEMOGLOBIN: 13.8 g/dL (ref 13.0–17.0)

## 2014-01-14 MED ORDER — FERROUS SULFATE 325 (65 FE) MG PO TABS: 325.0000 mg | ORAL_TABLET | Freq: Every day | ORAL | Status: DC

## 2014-01-14 NOTE — Telephone Encounter (Signed)
Message copied by Illene Regulus on Tue Jan 14, 2014  3:16 PM ------      Message from: Adin Hector      Created: Tue Jan 14, 2014 11:47 AM       Anemia result.  Okay to back off on iron supplement to just once a day until seen by Dr. Benay Spice with hematology/oncology            Lars Mage, CCS MA, please call on the patient to make sure recovery is going well & tell pt lab results.  Thanks,            Adin Hector, M.D., F.A.C.S.      Gastrointestinal and Minimally Invasive Surgery      [facility]      [address] [place]      [place], [place] [address]      [phone] Main / Paging             ------

## 2014-01-14 NOTE — Telephone Encounter (Signed)
Called pt to notify him the anemia is better so pt can back off on the iron supplement to just once a day until pt see's Dr Benay Spice. Rx for ferrous sulfate already sent to [facility] in [place] per Dr Johney [place].

## 2014-01-14 NOTE — Patient Instructions (Signed)
GETTING TO GOOD BOWEL HEALTH. Irregular bowel habits such as constipation and diarrhea can lead to many problems over time.  Having one soft bowel movement a day is the most important way to prevent further problems.  The anorectal canal is designed to handle stretching and feces to safely manage our ability to get rid of solid waste (feces, poop, stool) out of our body.  BUT, hard constipated stools can act like ripping concrete bricks and diarrhea can be a burning fire to this very sensitive area of our body, causing inflamed hemorrhoids, anal fissures, increasing risk is perirectal abscesses, abdominal pain/bloating, an making irritable bowel worse.     The goal: ONE SOFT BOWEL MOVEMENT A DAY!  To have soft, regular bowel movements:    Drink at least 8 tall glasses of water a day.     Take plenty of fiber.  Fiber is the undigested part of plant food that passes into the colon, acting s "natures broom" to encourage bowel motility and movement.  Fiber can absorb and hold large amounts of water. This results in a larger, bulkier stool, which is soft and easier to pass. Work gradually over several weeks up to 6 servings a day of fiber (25g a day even more if needed) in the form of: o Vegetables -- Root (potatoes, carrots, turnips), leafy green (lettuce, salad greens, celery, spinach), or cooked high residue (cabbage, broccoli, etc) o Fruit -- Fresh (unpeeled skin & pulp), Dried (prunes, apricots, cherries, etc ),  or stewed ( applesauce)  o Whole grain breads, pasta, etc (whole wheat)  o Bran cereals    Bulking Agents -- This type of water-retaining fiber generally is easily obtained each day by one of the following:  o Psyllium bran -- The psyllium plant is remarkable because its ground seeds can retain so much water. This product is available as Metamucil, Konsyl, Effersyllium, Per Diem Fiber, or the less expensive generic preparation in drug and health food stores. Although labeled a laxative, it really  is not a laxative.  o Methylcellulose -- This is another fiber derived from wood which also retains water. It is available as Citrucel. o Polyethylene Glycol - and "artificial" fiber commonly called Miralax or Glycolax.  It is helpful for people with gassy or bloated feelings with regular fiber o Flax Seed - a less gassy fiber than psyllium   No reading or other relaxing activity while on the toilet. If bowel movements take longer than 5 minutes, you are too constipated   AVOID CONSTIPATION.  High fiber and water intake usually takes care of this.  Sometimes a laxative is needed to stimulate more frequent bowel movements, but    Laxatives are not a good long-term solution as it can wear the colon out. o Osmotics (Milk of Magnesia, Fleets phosphosoda, Magnesium citrate, MiraLax, GoLytely) are safer than  o Stimulants (Senokot, Castor Oil, Dulcolax, Ex Lax)    o Do not take laxatives for more than 7days in a row.    IF SEVERELY CONSTIPATED, try a Bowel Retraining Program: o Do not use laxatives.  o Eat a diet high in roughage, such as bran cereals and leafy vegetables.  o Drink six (6) ounces of prune or apricot juice each morning.  o Eat two (2) large servings of stewed fruit each day.  o Take one (1) heaping tablespoon of a psyllium-based bulking agent twice a day. Use sugar-free sweetener when possible to avoid excessive calories.  o Eat a normal breakfast.  o   Set aside 15 minutes after breakfast to sit on the toilet, but do not strain to have a bowel movement.  o If you do not have a bowel movement by the third day, use an enema and repeat the above steps.    Controlling diarrhea o Switch to liquids and simpler foods for a few days to avoid stressing your intestines further. o Avoid dairy products (especially milk & ice cream) for a short time.  The intestines often can lose the ability to digest lactose when stressed. o Avoid foods that cause gassiness or bloating.  Typical foods include  beans and other legumes, cabbage, broccoli, and dairy foods.  Every person has some sensitivity to other foods, so listen to our body and avoid those foods that trigger problems for you. o Adding fiber (Citrucel, Metamucil, psyllium, Miralax) gradually can help thicken stools by absorbing excess fluid and retrain the intestines to act more normally.  Slowly increase the dose over a few weeks.  Too much fiber too soon can backfire and cause cramping & bloating. o Probiotics (such as active yogurt, Align, etc) may help repopulate the intestines and colon with normal bacteria and calm down a sensitive digestive tract.  Most studies show it to be of mild help, though, and such products can be costly. o Medicines:   Bismuth subsalicylate (ex. Kayopectate, Pepto Bismol) every 30 minutes for up to 6 doses can help control diarrhea.  Avoid if pregnant.   Loperamide (Immodium) can slow down diarrhea.  Start with two tablets (4mg  total) first and then try one tablet every 6 hours.  Avoid if you are having fevers or severe pain.  If you are not better or start feeling worse, stop all medicines and call your doctor for advice o Call your doctor if you are getting worse or not better.  Sometimes further testing (cultures, endoscopy, X-ray studies, bloodwork, etc) may be needed to help diagnose and treat the cause of the diarrhea.  Pelvic floor muscle training exercises  can help strengthen the muscles under the uterus, bladder, and bowel (large intestine). They can help both men and women who have problems with urine leakage or bowel control.  A pelvic floor muscle training exercise is like pretending that you have to urinate, and then holding it. You relax and tighten the muscles that control urine flow. It's important to find the right muscles to tighten.  The next time you have to urinate, start to go and then stop. Feel the muscles in your vagina, bladder, or anus get tight and move up. These are the pelvic  floor muscles. If you feel them tighten, you've done the exercise right. If you are still not sure whether you are tightening the right muscles, keep in mind that all of the muscles of the pelvic floor relax and contract at the same time. Because these muscles control the bladder, rectum, and vagina, the following tips may help: Women: Insert a finger into your vagina. Tighten the muscles as if you are holding in your urine, then let go. You should feel the muscles tighten and move up and down.  Men: Insert a finger into your rectum. Tighten the muscles as if you are holding in your urine, then let go. You should feel the muscles tighten and move up and down. These are the same muscles you would tighten if you were trying to prevent yourself from passing gas.  It is very important that you keep the following muscles relaxed while doing pelvic  floor muscle training exercises: Abdominal  Buttocks (the deeper, anal sphincter muscle should contract)  Thigh   A woman can also strengthen these muscles by using a vaginal cone, which is a weighted device that is inserted into the vagina. Then you try to tighten the pelvic floor muscles to hold the device in place. If you are unsure whether you are doing the pelvic floor muscle training correctly, you can use biofeedback and electrical stimulation to help find the correct muscle group to work. Biofeedback is a method of positive reinforcement. Electrodes are placed on the abdomen and along the anal area. Some therapists place a sensor in the vagina in women or anus in men to monitor the contraction of pelvic floor muscles.  A monitor will display a graph showing which muscles are contracting and which are at rest. The therapist can help find the right muscles for performing pelvic floor muscle training exercises.   PERFORMING PELVIC FLOOR EXERCISES: 1. Begin by emptying your bladder. 2. Tighten the pelvic floor muscles and hold for a count of 10. 3. Relax  the muscles completely for a count of 10. 4. Do 10 repititions, 3 to 5 times a day (morning, afternoon, and night). You can do these exercises at any time and any place. Most people prefer to do the exercises while lying down or sitting in a chair. After 4 - 6 weeks, most people notice some improvement. It may take as long as 3 months to see a major change. After a couple of weeks, you can also try doing a single pelvic floor contraction at times when you are likely to leak (for example, while getting out of a chair). A word of caution: Some people feel that they can speed up the progress by increasing the number of repetitions and the frequency of exercises. However, over-exercising can instead cause muscle fatigue and increase urine leakage. If you feel any discomfort in your abdomen or back while doing these exercises, you are probably doing them wrong. Breathe deeply and relax your body when you are doing these exercises. Make sure you are not tightening your stomach, thigh, buttock, or chest muscles. When done the right way, pelvic floor muscle exercises have been shown to be very effective at improving urinary continence. Alternative Names Kegel exercises    Colorectal Cancer Colorectal cancer is an abnormal growth of tissue (tumor) in the colon or rectum that is cancerous (malignant). Unlike noncancerous (benign) tumors, malignant tumors can spread to other parts of your body. The colon is the large bowel or large intestine. The rectum is the last several inches of the colon.  RISK FACTORS The exact cause of colorectal cancer is unknown. However, the following factors may increase your chances of getting colorectal cancer:   Age older than 55 years.   Abnormal growths (polyps) on the inner wall of the colon or rectum.   Diabetes.   African American race.   Family history of hereditary nonpolyposis colorectal cancer. This condition is caused by changes in the genes that are  responsible for repairing mismatched DNA.   Personal history of cancer. A person who has already had colorectal cancer may develop it a second time. Also, women with a history of ovarian, uterine, or breast cancer are at a somewhat higher risk of developing colorectal cancer.  Certain hereditary conditions.  Eating a diet that is high in fat (especially animal fat) and low in fiber, fruits, and vegetables.  Sedentary lifestyle.  Inflammatory bowel disease, including ulcerative  colitis and Crohn's disease.   Smoking.   Excessive alcohol use.  SYMPTOMS Early colorectal cancer often does not cause symptoms. As the cancer grows, symptoms may include:   Changes in bowel habits.  Diarrhea.   Constipation.   Feeling like the bowel does not empty completely after a bowel movement.   Blood in the stool.   Stools that are narrower than usual.   Abdominal discomfort, pain, bloating, fullness, or cramps.  Frequent gas pain.   Unexplained weight loss.   Constant tiredness.   Nausea and vomiting.  DIAGNOSIS  Your health care provider will ask about your medical history. He or she may also perform a number of procedures, such as:   A physical exam.  A digital rectal exam.  A fecal occult blood test.  A barium enema.  Blood tests.   X-rays.   Imaging tests, such as CT scans or MRIs.   Taking a tissue sample (biopsy) from your colon or rectum to look for cancer cells.   A sigmoidoscopy to view the inside of the last part of your colon.   A colonoscopy to view the inside of your entire colon.   An endorectal ultrasound to see how deep a rectal tumor has grown and whether the cancer has spread to lymph nodes or other nearby tissues.  Your cancer will be staged to determine its severity and extent. Staging is a careful attempt to find out the size of the tumor, whether the cancer has spread, and if so, to what parts of the body. You may need to have  more tests to determine the stage of your cancer. The test results will help determine what treatment plan is best for you.   Stage 0. The cancer is found only in the innermost lining of the colon or rectum.   Stage I. The cancer has grown into the inner wall of the colon or rectum. The cancer has not yet reached the outer wall of the colon.   Stage II. The cancer extends more deeply into or through the wall of the colon or rectum. It may have invaded nearby tissue, but cancer cells have not spread to the lymph nodes.   Stage III. The cancer has spread to nearby lymph nodes but not to other parts of the body.   Stage IV. The cancer has spread to other parts of the body, such as the liver or lungs.  Your health care provider may tell you the detailed stage of your cancer, which includes both a number and a letter.  TREATMENT  Depending on the type and stage, colorectal cancer may be treated with surgery, radiation therapy, chemotherapy, targeted therapy, or radiofrequency ablation. Some people have a combination of these therapies. Surgery may be done to remove the polyps from your colon. In early stages, your health care provider may be able to do this during a colonoscopy. In later stages, surgery may be done to remove part of your colon.  HOME CARE INSTRUCTIONS   Take medicines only as directed by your health care provider.   Maintain a healthy diet.   Consider joining a support group. This may help you learn to cope with the stress of having colorectal cancer.   Seek advice to help you manage treatment of side effects.   Keep all follow-up visits as directed by your health care provider.   Inform your cancer specialist if you are admitted to the hospital.  SEEK MEDICAL CARE IF:  Your diarrhea or  constipation does not go away.   Your bowel habits change.  You have increased abdominal pain.   You notice new fatigue or weakness.  You lose weight. Document Released:  05/23/2005 Document Revised: 10/07/2013 Document Reviewed: 11/15/2012 [facility] Patient Information 2015 [place], [place]. This information is not intended to replace advice given to you by your health care provider. Make sure you discuss any questions you have with your health care provider.  Anemia, Nonspecific Anemia is a condition in which the concentration of red blood cells or hemoglobin in the blood is below normal. Hemoglobin is a substance in red blood cells that carries oxygen to the tissues of the body. Anemia results in not enough oxygen reaching these tissues.  CAUSES  Common causes of anemia include:   Excessive bleeding. Bleeding may be internal or external. This includes excessive bleeding from periods (in women) or from the intestine.   Poor nutrition.   Chronic kidney, thyroid, and liver disease.  Bone marrow disorders that decrease red blood cell production.  Cancer and treatments for cancer.  HIV, AIDS, and their treatments.  Spleen problems that increase red blood cell destruction.  Blood disorders.  Excess destruction of red blood cells due to infection, medicines, and autoimmune disorders. SIGNS AND SYMPTOMS   Minor weakness.   Dizziness.   Headache.  Palpitations.   Shortness of breath, especially with exercise.   Paleness.  Cold sensitivity.  Indigestion.  Nausea.  Difficulty sleeping.  Difficulty concentrating. Symptoms may occur suddenly or they may develop slowly.  DIAGNOSIS  Additional blood tests are often needed. These help your health care provider determine the best treatment. Your health care provider will check your stool for blood and look for other causes of blood loss.  TREATMENT  Treatment varies depending on the cause of the anemia. Treatment can include:   Supplements of iron, vitamin Q91, or folic acid.   Hormone medicines.   A blood transfusion. This may be needed if blood loss is severe.   Hospitalization.  This may be needed if there is significant continual blood loss.   Dietary changes.  Spleen removal. HOME CARE INSTRUCTIONS Keep all follow-up appointments. It often takes many weeks to correct anemia, and having your health care provider check on your condition and your response to treatment is very important. SEEK IMMEDIATE MEDICAL CARE IF:   You develop extreme weakness, shortness of breath, or chest pain.   You become dizzy or have trouble concentrating.  You develop heavy vaginal bleeding.   You develop a rash.   You have bloody or black, tarry stools.   You faint.   You vomit up blood.   You vomit repeatedly.   You have abdominal pain.  You have a fever or persistent symptoms for more than 2-3 days.   You have a fever and your symptoms suddenly get worse.   You are dehydrated.  MAKE SURE YOU:  Understand these instructions.  Will watch your condition.  Will get help right away if you are not doing well or get worse. Document Released: 06/30/2004 Document Revised: 01/23/2013 Document Reviewed: 11/16/2012 [facility] Patient Information 2015 [place], [place]. This information is not intended to replace advice given to you by your health care provider. Make sure you discuss any questions you have with your health care provider.

## 2014-01-14 NOTE — Progress Notes (Signed)
Subjective:     Patient ID: Tyler Diaz, male   DOB: 1945/09/20, 68 y.o.   MRN: [id number]  HPI   Note: This dictation was prepared with Dragon/digital dictation along with [employer]. Any transcriptional errors that result from this process are unintentional.       Tyler Diaz  06/05/1946 [id number]  Patient Care Team: Ernestina Penna, MD as PCP - General (Family Medicine) Ardeth Sportsman, MD as Consulting Physician (Colon and Rectal Surgery) Shirley Friar, MD as Consulting Physician (Gastroenterology) Lance Bosch, MD as Consulting Physician (Radiation Oncology) Cira Rue, RN as Registered Nurse (Oncology) Ladene Artist, MD as Consulting Physician (Oncology) Judyann Munson, MD as Consulting Physician (Infectious Diseases)  Procedure (Date: 06/28/2013):  fPOST-OPERATIVE DIAGNOSIS:  Rectal cancer status post low anterior resection, coloanal anastomosis, diverting loop ileostomy.  Stricture of coloanal anastomosis.   PROCEDURE: Procedure(s):  loop ILEOSTOMY TAKEDOWN  Dilation of coloanal anastomosis  Examination of anesthesia   SURGEON: Surgeon(s):  Ardeth Sportsman, MD  Mariella Saa, MD - Asst  Diagnosis 1. Rectum, biopsy, distal margin - BENIGN FIBROVASCULAR SOFT TISSUE. - NO TUMOR SEEN. 2. Colon, segmental resection for tumor, sigmoid - INVASIVE WELL DIFFERENTIATED ADENOCARCINOMA WITH ABUNDANT ACELLULAR MUCIN, SPANNING 4.5 CM IN GREATEST DIMENSION. - TUMOR INVADES THROUGH MUSCULARIS PROPRIA INTO PERICOLORECTAL TISSUES WITH ACELLULAR MUCIN CLOSELY APPROACHING SEROSAL SURFACE. - DISTAL MARGIN OF RESECTION SPECIMEN SHOWS INVOLVEMENT WITH ACELLULAR MUCIN (SEE FIRST SPECIMEN FOR FINAL MARGIN STATUS). - OTHER MARGINS NEGATIVE. 1 of 4 Duplicate copy FINAL for Tyler Diaz, Tyler Diaz [id number]) Diagnosis(continued) - THREE LYMPH NODES POSITIVE FOR METASTATIC CARCINOMA (3/3). - FOUR ADDITIONAL LYMPH NODES WITH ACELLULAR MUCIN BUT NO RESIDUAL  VIABLE TUMOR (0/4). - NINE ADDITIONAL LYMPH NODES WITH NO TUMOR SEEN (0/9). - TWO SOFT TISSUE TUMOR DEPOSITS SHOWING VIABLE TUMOR. - TEN ADDITIONAL SOFT TISSUE DEPOSITS OF ACELLULAR MUCIN. - SEE ONCOLOGY TEMPLATE. Microscopic Comment 2. COLON AND RECTUM Specimen: Sigmoid and rectal colon. Procedure: Segmental resection of colon. Tumor site: Distal third of the rectum. Specimen integrity: Intact. Macroscopic intactness of mesorectum: Incomplete. Macroscopic tumor perforation: No. Invasive tumor: Maximum size: 4.5 cm. Histologic type(s): Adenocarcinoma with abundant mucin, see below comments. Histologic grade and differentiation: G1: well differentiated/low grade. Type of polyp in which invasive carcinoma arose: Precursor polyp not identified. Microscopic extension of invasive tumor: Tumor invades through muscularis propria into pericolorectal tissues. Lymph-Vascular invasion: Definitive lymphovascular invasion is not identified; however, several lymph nodes are positive for metastatic carcinoma, see below. Peri-neural invasion: Not identified. Tumor deposit(s) (discontinuous extramural extension): Yes, two tumor deposits with viable tumor. Resection margins: Proximal margin: Negative. Distal margin: Negative (see specimen #2). Circumferential (radial) (posterior ascending, posterior descending; lateral and posterior mid-rectum; and entire lower 1/3 rectum): Radial margin is negative. Mesenteric margin (sigmoid and transverse): Negative. Treatment effect (neoadjuvant therapy): There is evidence of treatment effect both in the primary tumor and in the lymph nodes. Number of lymph nodes examined 16 ; Number positive: 3, see below comments. Additional polyp(s): None. Non-neoplastic findings: No additional significant pathologic findings. Pathologic Staging: ypT3, ypN1b, MX. Ancillary studies: The tumor will be sent for MSI PCR and MMR IHC and this result will be reported in an  addendum per GI tumor protocol. Comments: The tumor is a well differentiated adenocarcinoma with abundant mucin. 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 Dr. Frederica Kuster has seen this case in consultation with agreement of the tumor staging. (RAH:caf 12/31/12) Zandra Abts MD Pathologist, Electronic Signature (Case signed 12/31/2012) 2 of 4 Duplicate copy FINAL for Tyler Diaz, Tyler Diaz [id number]) Intraoperative Diagnosis RAPID INTRAOPERATIVE CONSULT: RECTAL BIOPSY, DISTAL MARGIN, FROZEN SECTION - NO VIABLE MALIGNANCY IDENTIFIED.(JBK) Specimen Tyler Diaz and Clinical Information Specimen(s) Obtained: 1. Rectum, biopsy, distal margin 2. Colon, segmental resection for tumor, sigmoid Specimen Clinical Information 1. very distal rectal cancer (kp) Tyler Diaz 1. Received fresh is a 1 x 0.6 x 0.3 cm tan-red soft tissue with possible smooth glistening mucosa along one edge. The specimen is entirely submitted in one block for frozen section. 2. Specimen: Sigmoid rectal colon, which includes at last a portion of distal third of rectum. Specimen integrity: Intact. Specimen length: 28 cm. Mesorectal intactness: The mesorectum is incomplete, ranging from 1.2 to 2.7 cm in thickness. Tumor location: Distal third of rectum. Tumor size: There is a 2 cm in length and 4.5 cm in width firm, tan to tan-red centrally depressed mass with rolled edges. The central depression is up to 0.7 cm deep. Percent of bowel circumference involved: 50%. Tumor distance to margins: Proximal: 23 cm. Distal: 3 cm, with tan-red granular mucosa between  the mass and distal margin. Mesenteric (sigmoid and transverse): 6 cm. Radial (posterior ascending, posterior descending; lateral and posterior mid-rectum; and entire lower 1/3 rectum): In the area where the mesorectum is incomplete, the tumor is grossly adjacent to the inked perirectal soft tissue margin. Macroscopic extent of tumor invasion: Tumor is up to 1.7 cm thick, transecting the muscularis and invading into underlying perirectal soft tissue. Total presumed lymph nodes: Thirty possible lymph nodes ranging from 0.2 to 1.5 cm. Extramural satellite tumor nodules: None. Mucosal polyp(s): None. Additional findings: None. Block summary: A = proximal margin. B = distal margin. C - G = mass, full thickness with nearest radial margin. H = tissue for molecular testing. I = five nodes. J = four nodes. Diaz = four nodes. L = four nodes. M = four nodes. N = four nodes. O = three nodes. P = two nodes. Total, 16 blocks. (SSW:ecj 12/28/2012) 3 of 4 Duplicate copy FINAL for Tyler Diaz, Tyler Diaz ([id number]) Report signed out from the following location(s) Technical Component performed at [facility]. [address],[place] [address].CLIA:[id number],CAP:[id number]., Technical Component performed at [facility] [address], [place], [place] [address]. CLIA #: [id number], Interpretation performed at [facility] [address], [place], [place] [address]. CLIA #: [id number],    This patient returns for surgical re-evaluation.  He is gradually recovering.  Moving his bowels 1-2 times a day.  Stopped the MiraLAX.  Thought that is what the gastroenterologist told him to do.  Takes Imodium once a day.  Now has one better formed bowel movement a day.  Had colonoscopies last month showing benign polyp.  Occasionally has some fecal leakage but much milder.  Wears diapers just in case.  Still does pelvic floor physical therapy.  Eating better.  Weight  stable.  Energy level better.  In good spirits.  No fevers or chills.  No rectal bleeding.  Had a lot of questions about the surgical procedure again.  Patient Active Problem List   Diagnosis Date Noted  . Bacterial endocarditis 07/06/2013  . Stricture  of coloanal anastomosis 04/17/2013  . Anemia in neoplastic disease 01/14/2013  . Bilateral inguinal hernia (BIH) 01/03/2013  . Mitral regurgitation due to partially flail posterior mitral valve leaflet 07/30/2012  . Hyperlipidemia   . Rectal cancer - distal s/p lap LAR/coloanal July 2014 - [id number]. 07/27/2012  . Hypertension   . Vertigo     Past Medical History  Diagnosis Date  . Mitral regurgitation due to partially flail posterior mitral valve leaflet   . Rectal cancer   . Hyperlipidemia   . Colon cancer 07/18/12 bx    rectum=Invasive adenocarcinoma w/ extracellular mucin,polyp=benign  . Rheumatic fever as child    hx  . Hearing loss     partial greater on left  . Tinnitus     left ear  . Sinusitis     seasonal  . Arthritis     hands/knees  . Hematochezia     recent  . Heart murmur   . Hypothyroidism as child    hx of  . Numbness     TOES / FINGERS DUE TO CHEMO  . History of shingles   . Blood clot in vein     RT ARM WITH PICC LINE   . GERD (gastroesophageal reflux disease)     OCCASIONAL  . Nocturia   . Radiation     FOR COLON CANCER  . Umbilical hernia, incarcerated - s/p primary repair July 2014 01/03/2013  . Herpes zoster - R flank - with severe pain 03/11/2013  . Ileostomy - diverting loop - s/p takedown 06/28/2013 01/14/2013  . C. difficile colitis 08/19/2013    Past Surgical History  Procedure Laterality Date  . Carpal tunnel release Right 2002  . Tonsillectomy      as child  . Laparoscopic low anterior resection N/A 12/27/2012    Procedure: LAPAROSCOPIC LOW ANTERIOR RESECTION, COLO-ANAL ANASTOMOSIS, DIVERTING LOOP ILEOSTOMY,SPLENIC FLEXURE MOBILIZATION, PRIMARY INCARCERATED UMBILICAL HERNIA REPAIR;   Surgeon: Ardeth Sportsman, MD;  Location: [facility];  Service: General;  Laterality: N/A;  . Laparoscopic low anterior rescection with coloanal anastomosis N/A 12/27/2012    Procedure: LAPAROSCOPIC LOW ANTERIOR RESCECTION WITH COLOANAL ANASTOMOSIS;  Surgeon: Ardeth Sportsman, MD;  Location: [facility];  Service: General;  Laterality: N/A;  . Ileostomy N/A 12/27/2012    Procedure: DIVERTING LOOP ILEOSTOMY;  Surgeon: Ardeth Sportsman, MD;  Location: [facility];  Service: General;  Laterality: N/A;  . Umbilical hernia repair  12/27/2012    Procedure: PRIMARY REPAIR INCARCERATED UMBILICAL HERNIA;  Surgeon: Ardeth Sportsman, MD;  Location: [facility];  Service: General;;  . Hernia repair    . Ileostomy closure N/A 06/28/2013    Procedure: loop ILEOSTOMY TAKEDOWN examination of anesthesia ;  Surgeon: Ardeth Sportsman, MD;  Location: [facility];  Service: General;  Laterality: N/A;  . Tee without cardioversion N/A 07/10/2013    Procedure: TRANSESOPHAGEAL ECHOCARDIOGRAM (TEE);  Surgeon: Donato Schultz, MD;  Location: [facility] ENDOSCOPY;  Service: Cardiovascular;  Laterality: N/A;  . Tee without cardioversion N/A 08/21/2013    Procedure: TRANSESOPHAGEAL ECHOCARDIOGRAM (TEE);  Surgeon: Thurmon Fair, MD;  Location: [facility] ENDOSCOPY;  Service: Cardiovascular;  Laterality: N/A;    History   Social History  . Marital Status: Married    Spouse Name: N/A    Number of Children: 2  . Years of Education: N/A   Occupational History  . Retired     Development worker, international [employer]   Social History Main Topics  . Smoking status: Former Smoker -- 1.50 packs/day for  20 years    Types: Cigarettes    Quit date: 07/27/1972  . Smokeless tobacco: Never Used     Comment: quit smoking 35 years ago  . Alcohol Use: No  . Drug Use: No  . Sexual Activity: Not on file   Other Topics Concern  . 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  Married    Family History  Problem Relation Age of Onset  . Cancer  Mother     abdominal ca ?ovarian?  . Cancer Sister     liver cancer    Current Outpatient Prescriptions  Medication Sig Dispense Refill  . acetaminophen (TYLENOL) 500 MG tablet Take 500 mg by mouth every 6 (six) hours as needed for headache.      . diazepam (VALIUM) 2 MG tablet Take 2 mg by mouth at bedtime as needed for sedation.      . ferrous sulfate 325 (65 FE) MG tablet Take 1 tablet (325 mg total) by mouth 2 (two) times daily with a meal.  60 tablet  1  . hydrocortisone (PROCTOSOL HC) 2.5 % rectal cream Place 1 application rectally 2 (two) times daily.  30 g  0  . lisinopril (PRINIVIL,ZESTRIL) 10 MG tablet       . Loperamide HCl (IMODIUM PO) Take 1 tablet by mouth. Took only to go to MD appointment 3/24      . Multiple Vitamin (MULTIVITAMIN WITH MINERALS) TABS Take 1 tablet by mouth every morning.       Marland Kitchen oxymetazoline (AFRIN) 0.05 % nasal spray Place 1 spray into both nostrils 2 (two) times daily as needed for congestion.      . polyethylene glycol (MIRALAX / GLYCOLAX) packet Take 17 g by mouth daily.      . pravastatin (PRAVACHOL) 20 MG tablet       . promethazine (PHENERGAN) 25 MG tablet Take 25 mg by mouth every 6 (six) hours as needed for nausea.       Marland Kitchen saccharomyces boulardii (FLORASTOR) 250 MG capsule Take 1 capsule (250 mg total) by mouth 2 (two) times daily.      Marland Kitchen acetaminophen-codeine (TYLENOL #3) 300-30 MG per tablet Take 1 tablet by mouth every 8 (eight) hours as needed for moderate pain.  30 tablet  0   No current facility-administered medications for this visit.     Allergies  Allergen Reactions  . Shrimp [Shellfish Allergy] Hives, Nausea And Vomiting and Rash    BP 140/82  Pulse 76  Temp(Src) 97.6 F (36.4 C)  Ht 5\' 10"  (1.778 m)  Wt 182 lb (82.555 kg)  BMI 26.11 kg/m2  SpO2 97%  Ct Abdomen Pelvis W Contrast  07/07/2013   CLINICAL DATA:  History of rectal carcinoma. Status post recent ileostomy take-down. Positive blood cultures. Normal white blood cell  count. Patient was chills and fever. Patient has positive blood cultures.  EXAM: CT ABDOMEN AND PELVIS WITH CONTRAST  TECHNIQUE: Multidetector CT imaging of the abdomen and pelvis was performed using the standard protocol following bolus administration of intravenous contrast.  CONTRAST:  50mL OMNIPAQUE IOHEXOL 300 MG/ML SOLN, OMNIPAQUE IOHEXOL 300 MG/ML SOLN  COMPARISON:  08/02/2012  FINDINGS: There is irregular fluid attenuation associated with hazy and reticular inflammatory change in the subcutaneous soft tissues of the right lower quadrant. The irregular fluid measures approximately 3.6 cm x 2.2 cm x 0.5 cm. This may reflect a small area of sterile postoperative fluid with  associated edema. An early abscess is possible.  There is presacral edema and mild stranding in the fat of the pelvis that is all felt to be post surgical in origin. There is no intra-abdominal collection/abscess.  The appendix is dilated to 9 mm. There is no significant associated inflammatory change. This could potentially reflect an early acute appendicitis, but is not conclusive.  The ileal anastomosis in the right lower quadrant is unremarkable. No bowel wall thickening or inflammatory change. No bowel dilation to suggest obstruction or significant ileus.  Minimal lung base subsegmental atelectasis. Lung bases are otherwise clear.  Liver and spleen are unremarkable.  Multiple gallstones. No evidence of acute cholecystitis. No bile duct dilation. Normal pancreas. No adrenal masses.  Small renal low-density lesions, likely cysts. Kidneys are otherwise unremarkable. Normal ureters. Normal bladder. Prostate is enlarged but stable.  No pathologically enlarged lymph nodes.  No ascites.  IMPRESSION: 1. There are 2 potential sources for positive blood cultures. 2. There is a small irregular fluid collection within the subcutaneous fat of the right lower quadrant that could reflect a poorly defined abscess with associated cellulitis. It may  simply reflect sterile postoperative fluid and edema. 3. The appendix is dilated 9 mm. This could reflect an early acute appendicitis but is not conclusive. 4. There is no intra-abdominal or pelvic abscess. Mild pelvic fat stranding and presacral edema is consistent with the expected postsurgical change. 5. No evidence of bowel inflammation or obstruction. No adynamic ileus. 6. Other chronic findings as described are stable from the prior CT.   Electronically Signed   By: Amie Portland M.D.   On: 07/07/2013 09:00   Dg Abd Acute W/chest  07/05/2013   CLINICAL DATA:  Fever after recent ileostomy take-down  EXAM: ACUTE ABDOMEN SERIES (ABDOMEN 2 VIEW & CHEST 1 VIEW)  COMPARISON:  08/02/2012 chest CT  FINDINGS: Normal heart size and mediastinal contours. No acute infiltrate or edema. No effusion or pneumothorax. There is minimal atelectasis or scar at the peripheral left base. Previously noted pulmonary nodules are not visible. No acute osseous findings.  No gas dilated bowel. No abnormal fluid levels. Stool is present within the proximal colon, reassuring finding after ileostomy takedown. Negative for pneumoperitoneum.  IMPRESSION: Negative abdominal radiographs.  No evidence of pneumonia.   Electronically Signed   By: Tiburcio Pea M.D.   On: 07/05/2013 23:28    Review of Systems  Constitutional: Negative for fever, chills and diaphoresis.  HENT: Negative for sore throat and trouble swallowing.   Eyes: Negative for photophobia and visual disturbance.  Respiratory: Negative for choking and shortness of breath.   Cardiovascular: Negative for chest pain and palpitations.  Gastrointestinal: Negative for nausea, vomiting, abdominal distention, anal bleeding and rectal pain.  Genitourinary: Negative for dysuria, urgency, difficulty urinating and testicular pain.  Musculoskeletal: Negative for arthralgias, gait problem, myalgias and neck pain.  Skin: Negative for color change and rash.  Neurological:  Negative for dizziness, speech difficulty, weakness and numbness.  Hematological: Negative for adenopathy.  Psychiatric/Behavioral: Negative for hallucinations, confusion and agitation.       Objective:   Physical Exam  Constitutional: He is oriented to person, place, and time. He appears well-developed and well-nourished. No distress.  HENT:  Head: Normocephalic.  Mouth/Throat: Oropharynx is clear and moist. No oropharyngeal exudate.  Eyes: Conjunctivae and EOM are normal. Pupils are equal, round, and reactive to light. No scleral icterus.  Neck: Normal range of motion. No tracheal deviation present.  Cardiovascular: Normal rate, normal heart  sounds and intact distal pulses.   Pulmonary/Chest: Effort normal. No respiratory distress.  Abdominal: Soft. He exhibits no distension. There is no tenderness. No hernia. Hernia confirmed negative in the ventral area, confirmed negative in the right inguinal area and confirmed negative in the left inguinal area.    Incisions clean with normal healing ridges.  No hernias  Genitourinary:  Exam done with assistance of male Medical Assistant in the room.  Perianal skin clean with good hygiene.  No pruritis.  No pilonidal disease.  No fissure.  No abscess/fistula.    No external hemorrhoids.  Normal sphincter tone.  Tolerates digital rectal exam.  Anastomosis 1 cm from anal verge.  Smooth stricture.  Allows index finger to intubate across snugly = 18mm diameter.  Smooth edge.  Neorectum decompressed but normal.  No rectal masses to 7 cm.  No pelvic wall masses   Musculoskeletal: Normal range of motion. He exhibits no tenderness.  Neurological: He is alert and oriented to person, place, and time. No cranial nerve deficit. He exhibits normal muscle tone. Coordination normal.  Skin: Skin is warm and dry. No rash noted. He is not diaphoretic.  Psychiatric: He has a normal mood and affect. His behavior is normal.       Assessment:     Gradually  recovering from ileostomy takedown   Stricture a coloanal anastomosis but no obstruction    Plan:     Increase activity as tolerated to regular activity.  Low impact exercise such as walking an hour a day at least ideal.  Do not push through pain.  Diet as tolerated.  Low fat high fiber diet ideal.  Bowel regimen with 30 g fiber a day and fiber supplement as needed to avoid problems.  Iron with PRN immodium seems to be working.  Check hemoglobin and chemistry levels to see if anemia has resolved.  Consider weaning off iron anemias nonnormal vs. Continuing.  Follow up with Dr. Truett Perna for CT scan.  Seems about due for that.  CEA in May 0.8 =  normal.  Hopeful sign.  Continue anal dilators for stricture at colon anastomosis  Continue Kegel pelvic floor exercises to help strengthen sphincter & Pelvic floor PT  Return to clinic in 12 months to evaluate anal stricture & recurrence, sooner as needed.   Instructions discussed.  Followup with primary care physician for other health issues as would normally be done.  Consider screening for malignancies (breast, prostate, colon, melanoma, etc) as appropriate.  Questions answered.  The patient & son expressed understanding and appreciation

## 2014-03-13 ENCOUNTER — Ambulatory Visit (INDEPENDENT_AMBULATORY_CARE_PROVIDER_SITE_OTHER): Payer: Medicare Other | Admitting: *Deleted

## 2014-03-13 DIAGNOSIS — Z23 Encounter for immunization: Secondary | ICD-10-CM

## 2014-04-17 ENCOUNTER — Other Ambulatory Visit (HOSPITAL_BASED_OUTPATIENT_CLINIC_OR_DEPARTMENT_OTHER): Payer: Medicare Other

## 2014-04-17 ENCOUNTER — Ambulatory Visit (HOSPITAL_BASED_OUTPATIENT_CLINIC_OR_DEPARTMENT_OTHER): Payer: Medicare Other | Admitting: Nurse Practitioner

## 2014-04-17 ENCOUNTER — Telehealth: Payer: Self-pay | Admitting: Nurse Practitioner

## 2014-04-17 VITALS — BP 147/86 | HR 67 | Temp 98.0°F | Resp 20 | Ht 70.0 in | Wt 180.9 lb

## 2014-04-17 DIAGNOSIS — C20 Malignant neoplasm of rectum: Secondary | ICD-10-CM

## 2014-04-17 DIAGNOSIS — R110 Nausea: Secondary | ICD-10-CM

## 2014-04-17 DIAGNOSIS — I10 Essential (primary) hypertension: Secondary | ICD-10-CM

## 2014-04-17 DIAGNOSIS — R911 Solitary pulmonary nodule: Secondary | ICD-10-CM

## 2014-04-17 DIAGNOSIS — I82621 Acute embolism and thrombosis of deep veins of right upper extremity: Secondary | ICD-10-CM

## 2014-04-17 LAB — CEA: CEA: 0.6 ng/mL (ref 0.0–5.0)

## 2014-04-17 MED ORDER — HYDROCORTISONE 2.5 % RE CREA: 1.0000 "application " | TOPICAL_CREAM | Freq: Two times a day (BID) | RECTAL | Status: DC

## 2014-04-17 NOTE — Telephone Encounter (Signed)
Gave avs & cal for Feb. Also pt has contrast

## 2014-04-17 NOTE — Progress Notes (Signed)
[place] OFFICE PROGRESS NOTE   Diagnosis:  Rectal cancer   INTERVAL HISTORY:   Tyler Diaz returns as scheduled. He continues to experience urgency and periodic rectal incontinence but notes that symptoms are significantly better since beginning pelvic physical therapy. No pain with bowel movements. No rectal bleeding. He denies nausea/vomiting. He has a good appetite. He has intermittent tingling in the hands and feet. He denies shortness of breath.  Objective:  Vital signs in last 24 hours:  Blood pressure 147/86, pulse 67, temperature 98 F (36.7 C), resp. rate 20, height _0  (1.778 m), weight 180 lb 14.4 oz (82.056 kg), SpO2 100 %.    HEENT: no thrush or ulcers. Lymphatics: no palpable cervical, supraclavicular, axillary or inguinal lymph nodes. Resp: lungs clear bilaterally. Cardio: regular rate and rhythm. 2/6 systolic murmur. GI: abdomen soft and nontender. No hepatomegaly. Vascular: no leg edema.  Lab Results:  Lab Results  Component Value Date   WBC 13.0* 08/29/2013   HGB 13.8 01/14/2014   HCT 29.4* 08/29/2013   MCV 97.1* 08/29/2013   PLT 183 08/21/2013   NEUTROABS 22.4* 08/17/2013    Imaging:  No results found.  Medications: I have reviewed the patient's current medications.  Assessment/Plan: 1. Stage III rectal cancer, presenting with a locally advanced rectal tumor.  CT/MRI evidence of perirectal lymphadenopathy.   Status post neoadjuvant Xeloda and radiation.   Status post low anterior resection and coloanal anastomosis 12/27/2012 with pathology confirming a ypT3, ypN1b tumor, microsatellite stable.   Initiation of adjuvant CAPOX chemotherapy 01/30/2013.   Cycle 2 adjuvant CAPOX 02/20/2013.   Cycle 3 adjuvant CAPOX 03/13/2013.   Cycle 4 adjuvant CAPOX 04/03/2013 (oxaliplatin dose reduced with cycle 4 due to neuropathy, Xeloda dose reduced with cycle 4 due to hand-foot syndrome).   Cycle 5 adjuvant CAPOX  04/24/2013.  Surveillance colonoscopy 12/04/2013. Perianal erythema and tight anorectal sphincter. One 4 mm polyp in the sigmoid colon completely resected. Diverticulosis in the sigmoid colon. Repeat colonoscopy in 3 years for surveillance. 2. Small bilateral lung nodules and a mildly enlarged gastrohepatic node on the initial staging CT scans 08/02/2012.  CT chest on 01/23/2013 with stable bilateral pulmonary nodules. 3. Elevated pretreatment CEA.  Repeat CEA 1.2 on 05/21/2013. 4. Mitral regurgitation. 5. Hypertension. 6. History of rheumatic fever. 7. Pain and swelling at the right arm on 01/18/2013.  Doppler confirmed a right upper extremity deep vein thrombosis.   Daily Lovenox started on 01/18/2013.  8. PICC and Lovenox discontinued after an office visit 05/21/2013.  9. Acute herpes zoster affecting right thoracic dermatomes September 2014. He completed a course of valacyclovir. 10. Pain related to herpes zoster. Resolved. 11. Delayed nausea following cycle 1 CAPOX. Aloxi added to the premedication regimen beginning with cycle 2. 12. Diarrhea following cycle 2 CAPOX. 13. History of hand/foot syndrome secondary to Xeloda. 14. Ileostomy takedown procedure 06/28/2013. 15. Enterococcus bacteremia, probable endocarditis 07/05/2013.  16. PICC line placement for IV antibiotics 07/11/2013. 58. C. difficile colitis March 2015   Disposition: Mr. Stave remains in clinical remission from rectal cancer. We will followup on the CEA from today. We are referring him for CT scans of the chest/abdomen/pelvis in February 2016. He will return for a followup visit one week after the scans to review the results. He will contact the office in the interim with any problems.  Plan reviewed with Dr. Benay Spice.    Ned Card ANP/GNP-BC   04/17/2014  9:53 AM

## 2014-04-18 ENCOUNTER — Telehealth: Payer: Self-pay | Admitting: *Deleted

## 2014-04-18 NOTE — Telephone Encounter (Signed)
-----   Message from [name], MD sent at 04/17/2014  5:37 PM EST ----- Please call patient, cea is normal

## 2014-04-18 NOTE — Telephone Encounter (Signed)
Called and informed patient of normal cea.  Per Dr. Sherrill.  Patient verbalized understanding.  

## 2014-07-15 ENCOUNTER — Ambulatory Visit (HOSPITAL_COMMUNITY)
Admission: RE | Admit: 2014-07-15 | Discharge: 2014-07-15 | Disposition: A | Discharge status: Home / Self Care | Payer: Medicare Other | Source: Ambulatory Visit | Attending: Nurse Practitioner | Admitting: Nurse Practitioner

## 2014-07-15 ENCOUNTER — Other Ambulatory Visit (HOSPITAL_BASED_OUTPATIENT_CLINIC_OR_DEPARTMENT_OTHER): Payer: BLUE CROSS/BLUE SHIELD

## 2014-07-15 ENCOUNTER — Other Ambulatory Visit: Payer: Medicare Other

## 2014-07-15 ENCOUNTER — Encounter (HOSPITAL_COMMUNITY): Payer: Self-pay

## 2014-07-15 DIAGNOSIS — Z923 Personal history of irradiation: Secondary | ICD-10-CM | POA: Insufficient documentation

## 2014-07-15 DIAGNOSIS — Z9221 Personal history of antineoplastic chemotherapy: Secondary | ICD-10-CM | POA: Insufficient documentation

## 2014-07-15 DIAGNOSIS — C20 Malignant neoplasm of rectum: Secondary | ICD-10-CM

## 2014-07-15 LAB — CBC WITH DIFFERENTIAL/PLATELET
BASO%: 0.9 % (ref 0.0–2.0)
Basophils Absolute: 0.1 10*3/uL (ref 0.0–0.1)
EOS ABS: 0.3 10*3/uL (ref 0.0–0.5)
EOS%: 4.4 % (ref 0.0–7.0)
HEMATOCRIT: 38.6 % (ref 38.4–49.9)
HEMOGLOBIN: 12.9 g/dL — AB (ref 13.0–17.1)
LYMPH%: 15.5 % (ref 14.0–49.0)
MCH: 33.6 pg — ABNORMAL HIGH (ref 27.2–33.4)
MCHC: 33.4 g/dL (ref 32.0–36.0)
MCV: 100.5 fL — AB (ref 79.3–98.0)
MONO#: 0.8 10*3/uL (ref 0.1–0.9)
MONO%: 10.9 % (ref 0.0–14.0)
NEUT%: 68.3 % (ref 39.0–75.0)
NEUTROS ABS: 5.1 10*3/uL (ref 1.5–6.5)
PLATELETS: 148 10*3/uL (ref 140–400)
RBC: 3.84 10*6/uL — AB (ref 4.20–5.82)
RDW: 13.8 % (ref 11.0–14.6)
WBC: 7.5 10*3/uL (ref 4.0–10.3)
lymph#: 1.2 10*3/uL (ref 0.9–3.3)

## 2014-07-15 LAB — COMPREHENSIVE METABOLIC PANEL (CC13)
ALT: 25 U/L (ref 0–55)
ANION GAP: 8 meq/L (ref 3–11)
AST: 25 U/L (ref 5–34)
Albumin: 4.1 g/dL (ref 3.5–5.0)
Alkaline Phosphatase: 51 U/L (ref 40–150)
BILIRUBIN TOTAL: 1.49 mg/dL — AB (ref 0.20–1.20)
BUN: 15.5 mg/dL (ref 7.0–26.0)
CALCIUM: 9.2 mg/dL (ref 8.4–10.4)
CHLORIDE: 109 meq/L (ref 98–109)
CO2: 25 meq/L (ref 22–29)
Creatinine: 1 mg/dL (ref 0.7–1.3)
EGFR: 77 mL/min/{1.73_m2} — ABNORMAL LOW (ref >90)
GLUCOSE: 97 mg/dL (ref 70–140)
Potassium: 4 mEq/L (ref 3.5–5.1)
SODIUM: 141 meq/L (ref 136–145)
TOTAL PROTEIN: 6.4 g/dL (ref 6.4–8.3)

## 2014-07-15 LAB — CEA

## 2014-07-15 MED ORDER — IOHEXOL 300 MG/ML  SOLN
100.0000 mL | Freq: Once | INTRAMUSCULAR | Status: AC | PRN
  Administered 2014-07-15: 100 mL via INTRAVENOUS

## 2014-07-22 ENCOUNTER — Other Ambulatory Visit: Payer: Self-pay | Admitting: *Deleted

## 2014-07-22 ENCOUNTER — Telehealth: Payer: Self-pay | Admitting: Oncology

## 2014-07-22 ENCOUNTER — Ambulatory Visit (HOSPITAL_BASED_OUTPATIENT_CLINIC_OR_DEPARTMENT_OTHER): Payer: BLUE CROSS/BLUE SHIELD | Admitting: Oncology

## 2014-07-22 VITALS — BP 139/71 | HR 85 | Temp 97.9°F | Resp 19 | Ht 70.0 in | Wt 179.2 lb

## 2014-07-22 DIAGNOSIS — C20 Malignant neoplasm of rectum: Secondary | ICD-10-CM

## 2014-07-22 DIAGNOSIS — I10 Essential (primary) hypertension: Secondary | ICD-10-CM

## 2014-07-22 DIAGNOSIS — R911 Solitary pulmonary nodule: Secondary | ICD-10-CM

## 2014-07-22 DIAGNOSIS — Z86718 Personal history of other venous thrombosis and embolism: Secondary | ICD-10-CM

## 2014-07-22 MED ORDER — HYDROCORTISONE 2.5 % RE CREA: 1.0000 "application " | TOPICAL_CREAM | Freq: Two times a day (BID) | RECTAL | Status: DC

## 2014-07-22 NOTE — Progress Notes (Signed)
  [facility] OFF[facility] PROGRESS NOTE   Diagnosis: Rectal cancer  INTERVAL HISTORY:   Tyler Diaz returns as scheduled. He feels well. He is exercising. He continues to have irregular bowel habits and rectal urgency. A hemorrhoid cream and barrier cream help irritation at the perineum. Neuropathy symptoms have improved.  Objective:  Vital signs in last 24 hours:  Blood pressure 139/71, pulse 85, temperature 97.9 F (36.6 C), temperature source Oral, resp. rate 19, height _0  (1.778 m), weight 179 lb 3.2 oz (81.285 kg), SpO2 98 %.    HEENT: Neck without mass Lymphatics: No cervical, supraclavicular, axillary, or inguinal nodes Resp: Lungs clear bilaterally Cardio: Regular rate and rhythm, 2/6 systolic murmur GI: No hepatomegaly, nontender, no mass Vascular: No leg edema   Lab Results:  Lab Results  Component Value Date   WBC 7.5 07/15/2014   HGB 12.9* 07/15/2014   HCT 38.6 07/15/2014   MCV 100.5* 07/15/2014   PLT 148 07/15/2014   NEUTROABS 5.1 07/15/2014      Lab Results  Component Value Date   CEA <0.5 07/15/2014    Medications: I have reviewed the patient's current medications.  Assessment/Plan: 1. Stage III rectal cancer, presenting with a locally advanced rectal tumor.  CT/MRI evidence of perirectal lymphadenopathy.   Status post neoadjuvant Xeloda and radiation.   Status post low anterior resection and coloanal anastomosis 12/27/2012 with pathology confirming a ypT3, ypN1b tumor, microsatellite stable.   Initiation of adjuvant CAPOX chemotherapy 01/30/2013.   Cycle 2 adjuvant CAPOX 02/20/2013.   Cycle 3 adjuvant CAPOX 03/13/2013.   Cycle 4 adjuvant CAPOX 04/03/2013 (oxaliplatin dose reduced with cycle 4 due to neuropathy, Xeloda dose reduced with cycle 4 due to hand-foot syndrome).   Cycle 5 adjuvant CAPOX 04/24/2013.  Surveillance colonoscopy 12/04/2013. Perianal erythema and tight anorectal sphincter. One 4 mm polyp in the  sigmoid colon completely resected. Diverticulosis in the sigmoid colon. Repeat colonoscopy in 3 years for surveillance.  CTs 07/15/2004 with stable lung nodules, no evidence of metastatic disease 2. Small bilateral lung nodules and a mildly enlarged gastrohepatic node on the initial staging CT scans 08/02/2012.  CT chest on 01/23/2013 with stable bilateral pulmonary nodules.  CT chest 07/15/2004 with stable lung nodules 3. Elevated pretreatment CEA.  Repeat CEA 1.2 on 05/21/2013. 4. Mitral regurgitation. 5. Hypertension. 6. History of rheumatic fever. 7. Pain and swelling at the right arm on 01/18/2013.  Doppler confirmed a right upper extremity deep vein thrombosis.   Daily Lovenox started on 01/18/2013.  8. PICC and Lovenox discontinued after an office visit 05/21/2013.  9. Acute herpes zoster affecting right thoracic dermatomes September 2014. He completed a course of valacyclovir. 10. Pain related to herpes zoster. Resolved. 11. Delayed nausea following cycle 1 CAPOX. Aloxi added to the premedication regimen beginning with cycle 2. 12. Diarrhea following cycle 2 CAPOX. 13. History of hand/foot syndrome secondary to Xeloda. 14. Ileostomy takedown procedure 06/28/2013. 15. Enterococcus bacteremia, probable endocarditis 07/05/2013.  16. PICC line placement for IV antibiotics 07/11/2013. 31. C. difficile colitis March 2015     Disposition:  Tyler Diaz remains in clinical remission from rectal cancer. He will return for an office visit and CEA in 6 months. He will discontinue iron therapy.  [name], MD  07/22/2014  8:35 AM

## 2014-07-22 NOTE — Telephone Encounter (Signed)
gv and printed appt sched and avs for pt for Aug °

## 2014-10-19 ENCOUNTER — Other Ambulatory Visit: Payer: Self-pay | Admitting: Oncology

## 2014-10-24 ENCOUNTER — Other Ambulatory Visit: Payer: Self-pay | Admitting: Oncology

## 2014-10-24 NOTE — Telephone Encounter (Signed)
Spoke with Tiffany at [facility]. Requested Proctofoam refill be sent to PCP.

## 2014-10-28 ENCOUNTER — Other Ambulatory Visit: Payer: Self-pay | Admitting: *Deleted

## 2014-10-28 DIAGNOSIS — C20 Malignant neoplasm of rectum: Secondary | ICD-10-CM

## 2014-10-28 MED ORDER — HYDROCORTISONE 2.5 % RE CREA: 1.0000 "application " | TOPICAL_CREAM | Freq: Two times a day (BID) | RECTAL | Status: DC

## 2014-11-18 ENCOUNTER — Encounter: Payer: Self-pay | Admitting: Family Medicine

## 2015-01-20 ENCOUNTER — Other Ambulatory Visit (HOSPITAL_BASED_OUTPATIENT_CLINIC_OR_DEPARTMENT_OTHER): Payer: Medicare Other

## 2015-01-20 ENCOUNTER — Telehealth: Payer: Self-pay | Admitting: Oncology

## 2015-01-20 ENCOUNTER — Ambulatory Visit (HOSPITAL_BASED_OUTPATIENT_CLINIC_OR_DEPARTMENT_OTHER): Payer: Medicare Other | Admitting: Nurse Practitioner

## 2015-01-20 VITALS — BP 143/76 | HR 73 | Temp 98.4°F | Resp 18 | Ht 70.0 in | Wt 180.0 lb

## 2015-01-20 DIAGNOSIS — C20 Malignant neoplasm of rectum: Secondary | ICD-10-CM

## 2015-01-20 DIAGNOSIS — Z85048 Personal history of other malignant neoplasm of rectum, rectosigmoid junction, and anus: Secondary | ICD-10-CM | POA: Diagnosis not present

## 2015-01-20 LAB — CBC WITH DIFFERENTIAL/PLATELET
BASO%: 1.3 % (ref 0.0–2.0)
Basophils Absolute: 0.1 10*3/uL (ref 0.0–0.1)
EOS ABS: 0.6 10*3/uL — AB (ref 0.0–0.5)
EOS%: 8.2 % — ABNORMAL HIGH (ref 0.0–7.0)
HEMATOCRIT: 40.1 % (ref 38.4–49.9)
HGB: 13.4 g/dL (ref 13.0–17.1)
LYMPH#: 1.2 10*3/uL (ref 0.9–3.3)
LYMPH%: 15.6 % (ref 14.0–49.0)
MCH: 33.4 pg (ref 27.2–33.4)
MCHC: 33.5 g/dL (ref 32.0–36.0)
MCV: 99.9 fL — AB (ref 79.3–98.0)
MONO#: 0.8 10*3/uL (ref 0.1–0.9)
MONO%: 10.6 % (ref 0.0–14.0)
NEUT#: 4.8 10*3/uL (ref 1.5–6.5)
NEUT%: 64.3 % (ref 39.0–75.0)
PLATELETS: 163 10*3/uL (ref 140–400)
RBC: 4.02 10*6/uL — ABNORMAL LOW (ref 4.20–5.82)
RDW: 14.4 % (ref 11.0–14.6)
WBC: 7.4 10*3/uL (ref 4.0–10.3)

## 2015-01-20 LAB — CEA: CEA: <0.5 ng/mL (ref 0.0–5.0)

## 2015-01-20 MED ORDER — HYDROCORTISONE 2.5 % RE CREA: 1.0000 "application " | TOPICAL_CREAM | Freq: Two times a day (BID) | RECTAL | Status: DC

## 2015-01-20 NOTE — Progress Notes (Signed)
[place] OFFICE PROGRESS NOTE   Diagnosis:  Rectal cancer  INTERVAL HISTORY:   Tyler Diaz returns as scheduled. He feels well. He has a good appetite. Bowels moving regularly. No bloody or black stools. No nausea or vomiting. No insistent abdominal pain. He occasionally notes "tenderness" at the lower abdomen with straining. No shortness of breath. He reports an intermittently scabbed nonhealing lesion on the left ear.  Objective:  Vital signs in last 24 hours:  Blood pressure 143/76, pulse 73, temperature 98.4 F (36.9 C), temperature source Oral, resp. rate 18, height _0  (1.778 m), weight 180 lb (81.647 kg), SpO2 100 %.    HEENT: No thrush or ulcers. Lymphatics: No palpable cervical, supra clavicular, axillary or inguinal lymph nodes. Resp: Lungs clear bilaterally. Cardio: Regular rate and rhythm. 2/6 systolic murmur. GI: Abdomen soft and nontender. No organomegaly. No mass. Vascular: No leg edema. Calves soft and nontender.  Skin: Left ear pinna with a superficially ulcerated nonpigmented lesion.    Lab Results:  Lab Results  Component Value Date   WBC 7.4 01/20/2015   HGB 13.4 01/20/2015   HCT 40.1 01/20/2015   MCV 99.9* 01/20/2015   PLT 163 01/20/2015   NEUTROABS 4.8 01/20/2015    Imaging:  No results found.  Medications: I have reviewed the patient's current medications.  Assessment/Plan: 1. Stage III rectal cancer, presenting with a locally advanced rectal tumor.  CT/MRI evidence of perirectal lymphadenopathy.   Status post neoadjuvant Xeloda and radiation.   Status post low anterior resection and coloanal anastomosis 12/27/2012 with pathology confirming a ypT3, ypN1b tumor, microsatellite stable.   Initiation of adjuvant CAPOX chemotherapy 01/30/2013.   Cycle 2 adjuvant CAPOX 02/20/2013.   Cycle 3 adjuvant CAPOX 03/13/2013.   Cycle 4 adjuvant CAPOX 04/03/2013 (oxaliplatin dose reduced with cycle 4 due to neuropathy, Xeloda  dose reduced with cycle 4 due to hand-foot syndrome).   Cycle 5 adjuvant CAPOX 04/24/2013.  Surveillance colonoscopy 12/04/2013. Perianal erythema and tight anorectal sphincter. One 4 mm polyp in the sigmoid colon completely resected. Diverticulosis in the sigmoid colon. Repeat colonoscopy in 3 years for surveillance.  CTs 07/15/2004 with stable lung nodules, no evidence of metastatic disease 2. Small bilateral lung nodules and a mildly enlarged gastrohepatic node on the initial staging CT scans 08/02/2012.  CT chest on 01/23/2013 with stable bilateral pulmonary nodules.  CT chest 07/15/2004 with stable lung nodules 3. Elevated pretreatment CEA.  Repeat CEA 1.2 on 05/21/2013. 4. Mitral regurgitation. 5. Hypertension. 6. History of rheumatic fever. 7. Pain and swelling at the right arm on 01/18/2013.  Doppler confirmed a right upper extremity deep vein thrombosis.   Daily Lovenox started on 01/18/2013.  8. PICC and Lovenox discontinued after an office visit 05/21/2013.  9. Acute herpes zoster affecting right thoracic dermatomes September 2014. He completed a course of valacyclovir. 10. Pain related to herpes zoster. Resolved. 11. Delayed nausea following cycle 1 CAPOX. Aloxi added to the premedication regimen beginning with cycle 2. 12. Diarrhea following cycle 2 CAPOX. 13. History of hand/foot syndrome secondary to Xeloda. 14. Ileostomy takedown procedure 06/28/2013. 15. Enterococcus bacteremia, probable endocarditis 07/05/2013.  16. PICC line placement for IV antibiotics 07/11/2013. 77. C. difficile colitis March 2015   Disposition: Tyler Diaz remains in clinical remission from rectal cancer. We will follow-up on the CEA from today. We will schedule him for restaging CT scans in 6 months.  We discussed the nonhealing lesion on the left ear. I offered to make a dermatology referral.  He prefers to see his PCP this week. He will contact us if he changes his mind on the  dermatology referral.  Tyler Diaz will return for a follow-up visit in 6 months with labs and CT scans 2-3 days prior. He will contact the office in the interim as outlined above or with any other problems.  Plan reviewed with Dr. Benay Spice.    Ned Card ANP/GNP-BC   01/20/2015  9:03 AM

## 2015-01-20 NOTE — Telephone Encounter (Signed)
Gave patient avs report and appointments for February 2017.  °

## 2015-01-20 NOTE — Telephone Encounter (Signed)
Add to previous note............. [facility] will contact patient re ct scan 2-4 weeks prior to expected date - patient aware.

## 2015-01-21 ENCOUNTER — Telehealth: Payer: Self-pay | Admitting: *Deleted

## 2015-01-21 NOTE — Telephone Encounter (Signed)
Opened in error

## 2015-01-21 NOTE — Telephone Encounter (Signed)
Called pt and wife; Patient allowed wife to take call; made them aware CEA results were normal. Both appreciated call.

## 2015-01-21 NOTE — Telephone Encounter (Signed)
-----   Message from [name], MD sent at 01/20/2015  7:57 PM EDT ----- Please call patient, Tyler Diaz is normal

## 2015-01-23 ENCOUNTER — Ambulatory Visit (INDEPENDENT_AMBULATORY_CARE_PROVIDER_SITE_OTHER): Payer: Medicare Other | Admitting: Family Medicine

## 2015-01-23 ENCOUNTER — Encounter: Payer: Self-pay | Admitting: Family Medicine

## 2015-01-23 VITALS — BP 144/84 | HR 66 | Temp 97.0°F | Ht 70.0 in | Wt 180.8 lb

## 2015-01-23 DIAGNOSIS — C44219 Basal cell carcinoma of skin of left ear and external auricular canal: Secondary | ICD-10-CM | POA: Diagnosis not present

## 2015-01-23 NOTE — Progress Notes (Signed)
Subjective:  Patient ID: Tyler Diaz, male    DOB: Mar 03, 1946  Age: 69 y.o. MRN: [id number]  CC: lesion on ear   HPI Tyler Diaz presents for Several months of increase in size of lesion, left ear. Nontender. Started as an insect bite.  History Tyler Diaz has a past medical history of Mitral regurgitation due to partially flail posterior mitral valve leaflet; Rectal cancer; Hyperlipidemia; Colon cancer (07/18/12 bx); Rheumatic fever (as child); Hearing loss; Tinnitus; Sinusitis; Arthritis; Hematochezia; Heart murmur; Hypothyroidism (as child); Numbness; History of shingles; Blood clot in vein; GERD (gastroesophageal reflux disease); Nocturia; Radiation; Umbilical hernia, incarcerated - s/p primary repair July 2014 (01/03/2013); Herpes zoster - R flank - with severe pain (03/11/2013); Ileostomy - diverting loop - s/p takedown 06/28/2013 (01/14/2013); and C. difficile colitis (08/19/2013).   He has past surgical history that includes Carpal tunnel release (Right, 2002); Tonsillectomy; Laparoscopic low anterior resection (N/A, 12/27/2012); Laparoscopic low anterior rescection with coloanal anastomosis (N/A, 12/27/2012); Ileostomy (N/A, 12/27/2012); Umbilical hernia repair (12/27/2012); Hernia repair; Ileostomy closure (N/A, 06/28/2013); TEE without cardioversion (N/A, 07/10/2013); and TEE without cardioversion (N/A, 08/21/2013).   His family history includes Cancer in his mother and sister.He reports that he quit smoking about 42 years ago. His smoking use included Cigarettes. He has a 30 pack-year smoking history. He has never used smokeless tobacco. He reports that he does not drink alcohol or use illicit drugs.  Outpatient Prescriptions Prior to Visit  Medication Sig Dispense Refill  . acetaminophen (TYLENOL) 500 MG tablet Take 500 mg by mouth every 6 (six) hours as needed for headache.    . hydrocortisone (PROCTOSOL HC) 2.5 % rectal cream Place 1 application rectally 2 (two) times daily. 30 g 1  . Loperamide  HCl (IMODIUM PO) Take 1 tablet by mouth. Took only to go to MD appointment 3/24    . Multiple Vitamin (MULTIVITAMIN WITH MINERALS) TABS Take 1 tablet by mouth every morning.     Marland Kitchen oxymetazoline (AFRIN) 0.05 % nasal spray Place 1 spray into both nostrils 2 (two) times daily as needed for congestion.    . pravastatin (PRAVACHOL) 20 MG tablet     . promethazine (PHENERGAN) 25 MG tablet Take 25 mg by mouth every 6 (six) hours as needed for nausea.     Marland Kitchen saccharomyces boulardii (FLORASTOR) 250 MG capsule Take 1 capsule (250 mg total) by mouth 2 (two) times daily. (Patient not taking: Reported on 01/23/2015)    . ferrous sulfate 325 (65 FE) MG tablet Take 1 tablet (325 mg total) by mouth daily with breakfast. (Patient not taking: Reported on 01/23/2015) 90 tablet 5  . lisinopril (PRINIVIL,ZESTRIL) 10 MG tablet     . polyethylene glycol (MIRALAX / GLYCOLAX) packet Take 17 g by mouth daily.     No facility-administered medications prior to visit.    ROS Review of Systems  Constitutional: Negative for fever, chills and diaphoresis.  HENT: Negative for congestion, rhinorrhea and sore throat.   Respiratory: Negative for cough, shortness of breath and wheezing.   Cardiovascular: Negative for chest pain.  Skin: Negative for rash.    Objective:  BP 144/84 mmHg  Pulse 66  Temp(Src) 97 F (36.1 C) (Oral)  Ht 5\' 10"  (1.778 m)  Wt 180 lb 12.8 oz (82.01 kg)  BMI 25.94 kg/m2  BP Readings from Last 3 Encounters:  01/23/15 144/84  01/20/15 143/76  07/22/14 139/71    Wt Readings from Last 3 Encounters:  01/23/15 180 lb 12.8 oz (82.01 kg)  01/20/15 180 lb (81.647 kg)  07/22/14 179 lb 3.2 oz (81.285 kg)     Physical Exam  Constitutional: He appears well-developed and well-nourished.  HENT:  Head: Normocephalic and atraumatic.  Right Ear: External ear normal.  Left Ear: External ear normal.  Mouth/Throat: No oropharyngeal exudate or posterior oropharyngeal erythema.  Eyes: Pupils are equal,  round, and reactive to light.  Neck: Normal range of motion. Neck supple.  Cardiovascular: Normal rate and regular rhythm.   No murmur heard. Pulmonary/Chest: Breath sounds normal. No respiratory distress.  Abdominal: Bowel sounds are normal.  Skin:  1 cm raised lesion on posterior pinna. Nontender, raised,perlescent  Vitals reviewed.   No results found for: HGBA1C  Lab Results  Component Value Date   WBC 7.4 01/20/2015   HGB 13.4 01/20/2015   HCT 40.1 01/20/2015   PLT 163 01/20/2015   GLUCOSE 97 07/15/2014   ALT 25 07/15/2014   AST 25 07/15/2014   NA 141 07/15/2014   K 4.0 07/15/2014   CL 104 01/14/2014   CREATININE 1.0 07/15/2014   BUN 15.5 07/15/2014   CO2 25 07/15/2014    Ct Chest W Contrast  07/15/2014   CLINICAL DATA:  Rectal cancer diagnosed in 2014. Chemotherapy and radiation therapy complete in 2015. Hernia surgery and colon resection.  EXAM: CT CHEST, ABDOMEN, AND PELVIS WITH CONTRAST  TECHNIQUE: Multidetector CT imaging of the chest, abdomen and pelvis was performed following the standard protocol during bolus administration of intravenous contrast.  CONTRAST:  148mL OMNIPAQUE IOHEXOL 300 MG/ML  SOLN  COMPARISON:  Abdominal pelvic CT of 07/07/2013. Chest CT of 01/23/2013. Chest radiograph 08/17/2013.  FINDINGS: CT CHEST FINDINGS  Mediastinum/Nodes: No supraclavicular adenopathy. Normal heart size, without pericardial effusion. No central pulmonary embolism, on this non-dedicated study. No mediastinal or hilar adenopathy.  Lungs/Pleura: Stable right upper lobe pulmonary nodule on image 35 of approximately 4 mm.  A right lower lobe 7 mm nodule is similar on image 33. 3 mm left upper lobe pulmonary nodule on image 16 is felt to be similar as is a 3 mm nodule in the left upper lobe on image 29.  Minimal nodularity along the left major fissure is unchanged.  No pleural fluid.  Musculoskeletal: No acute osseous abnormality.  CT ABDOMEN PELVIS FINDINGS  Hepatobiliary: Normal liver.  Stone filled gallbladder without inflammation or biliary ductal dilatation.  Pancreas: Normal, without mass or ductal dilatation.  Spleen: Normal  Adrenals/Urinary Tract: Normal adrenal glands. Too small to characterize lesions in the left kidney. Interpolar right renal cyst. Bilateral renal sinus cysts. Normal ureters and urinary bladder.  Stomach/Bowel: Normal stomach, without wall thickening. Partial left hemicolectomy. Normal terminal ileum and appendix. Normal small bowel.  Vascular/Lymphatic: Aortic and branch vessel atherosclerosis. No retroperitoneal or retrocrural adenopathy. No pelvic sidewall adenopathy. No perirectal adenopathy or mesenteric adenopathy.  Reproductive: Mild prostatomegaly.  Other: Presacral fascia thickening is likely treatment related and similar to slightly decreased since the prior exam. No well-defined mass or abnormal enhancement. No significant free fluid. No evidence of omental or peritoneal disease. Fat containing inguinal hernias are larger on the right.  Musculoskeletal: Trace L3-4 anterolisthesis, likely degenerative. Degenerative disc disease and spondylosis is advanced at this level as well as L4-5.  IMPRESSION: CT CHEST IMPRESSION  1.  No acute process or evidence of metastatic disease in the chest. 2. Similar bilateral pulmonary nodules, consistent with a benign etiology.  CT ABDOMEN AND PELVIS IMPRESSION  1. Surgical and treatment changes within the rectum and colon. No  evidence of recurrent or metastatic disease. 2. Cholelithiasis.   Electronically Signed   By: Abigail Miyamoto M.D.   On: 07/15/2014 12:23   Ct Abdomen Pelvis W Contrast  07/15/2014   CLINICAL DATA:  Rectal cancer diagnosed in 2014. Chemotherapy and radiation therapy complete in 2015. Hernia surgery and colon resection.  EXAM: CT CHEST, ABDOMEN, AND PELVIS WITH CONTRAST  TECHNIQUE: Multidetector CT imaging of the chest, abdomen and pelvis was performed following the standard protocol during bolus  administration of intravenous contrast.  CONTRAST:  171mL OMNIPAQUE IOHEXOL 300 MG/ML  SOLN  COMPARISON:  Abdominal pelvic CT of 07/07/2013. Chest CT of 01/23/2013. Chest radiograph 08/17/2013.  FINDINGS: CT CHEST FINDINGS  Mediastinum/Nodes: No supraclavicular adenopathy. Normal heart size, without pericardial effusion. No central pulmonary embolism, on this non-dedicated study. No mediastinal or hilar adenopathy.  Lungs/Pleura: Stable right upper lobe pulmonary nodule on image 35 of approximately 4 mm.  A right lower lobe 7 mm nodule is similar on image 33. 3 mm left upper lobe pulmonary nodule on image 16 is felt to be similar as is a 3 mm nodule in the left upper lobe on image 29.  Minimal nodularity along the left major fissure is unchanged.  No pleural fluid.  Musculoskeletal: No acute osseous abnormality.  CT ABDOMEN PELVIS FINDINGS  Hepatobiliary: Normal liver. Stone filled gallbladder without inflammation or biliary ductal dilatation.  Pancreas: Normal, without mass or ductal dilatation.  Spleen: Normal  Adrenals/Urinary Tract: Normal adrenal glands. Too small to characterize lesions in the left kidney. Interpolar right renal cyst. Bilateral renal sinus cysts. Normal ureters and urinary bladder.  Stomach/Bowel: Normal stomach, without wall thickening. Partial left hemicolectomy. Normal terminal ileum and appendix. Normal small bowel.  Vascular/Lymphatic: Aortic and branch vessel atherosclerosis. No retroperitoneal or retrocrural adenopathy. No pelvic sidewall adenopathy. No perirectal adenopathy or mesenteric adenopathy.  Reproductive: Mild prostatomegaly.  Other: Presacral fascia thickening is likely treatment related and similar to slightly decreased since the prior exam. No well-defined mass or abnormal enhancement. No significant free fluid. No evidence of omental or peritoneal disease. Fat containing inguinal hernias are larger on the right.  Musculoskeletal: Trace L3-4 anterolisthesis, likely  degenerative. Degenerative disc disease and spondylosis is advanced at this level as well as L4-5.  IMPRESSION: CT CHEST IMPRESSION  1.  No acute process or evidence of metastatic disease in the chest. 2. Similar bilateral pulmonary nodules, consistent with a benign etiology.  CT ABDOMEN AND PELVIS IMPRESSION  1. Surgical and treatment changes within the rectum and colon. No evidence of recurrent or metastatic disease. 2. Cholelithiasis.   Electronically Signed   By: Abigail Miyamoto M.D.   On: 07/15/2014 12:23    Assessment & Plan:   Keaun was seen today for lesion on ear.  Diagnoses and all orders for this visit:  Basal cell carcinoma of auricle of ear, left -     Ambulatory referral to Dermatology   I have discontinued Mr. Tippy's polyethylene glycol and ferrous sulfate. I am also having him maintain his promethazine, multivitamin with minerals, oxymetazoline, acetaminophen, saccharomyces boulardii, Loperamide HCl (IMODIUM PO), pravastatin, hydrocortisone, and lisinopril.  Meds ordered this encounter  Medications  . lisinopril (PRINIVIL,ZESTRIL) 40 MG tablet    Sig: Take 1 tablet by mouth daily.    Refill:  3     Follow-up: Return if symptoms worsen or fail to improve.  Claretta Fraise, M.D.

## 2015-02-14 IMAGING — CR DG ABDOMEN ACUTE W/ 1V CHEST
4 series · 4 of 4 positions shown · non-contrast
Comparison: 08/02/2012 chest CT

CLINICAL DATA: Fever after recent ileostomy take-down

EXAM:
ACUTE ABDOMEN SERIES (ABDOMEN 2 VIEW & CHEST 1 VIEW)

[w chest pa]
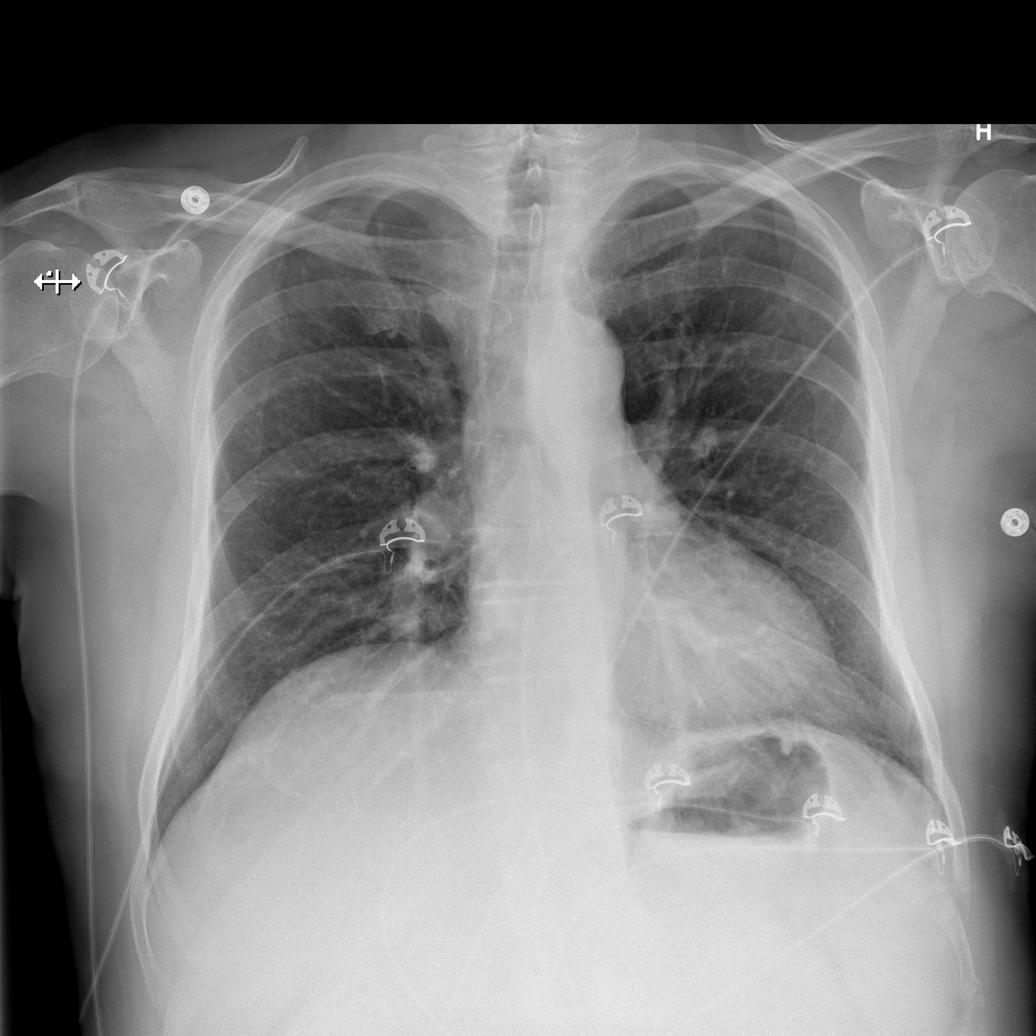

[w abdomen upright]
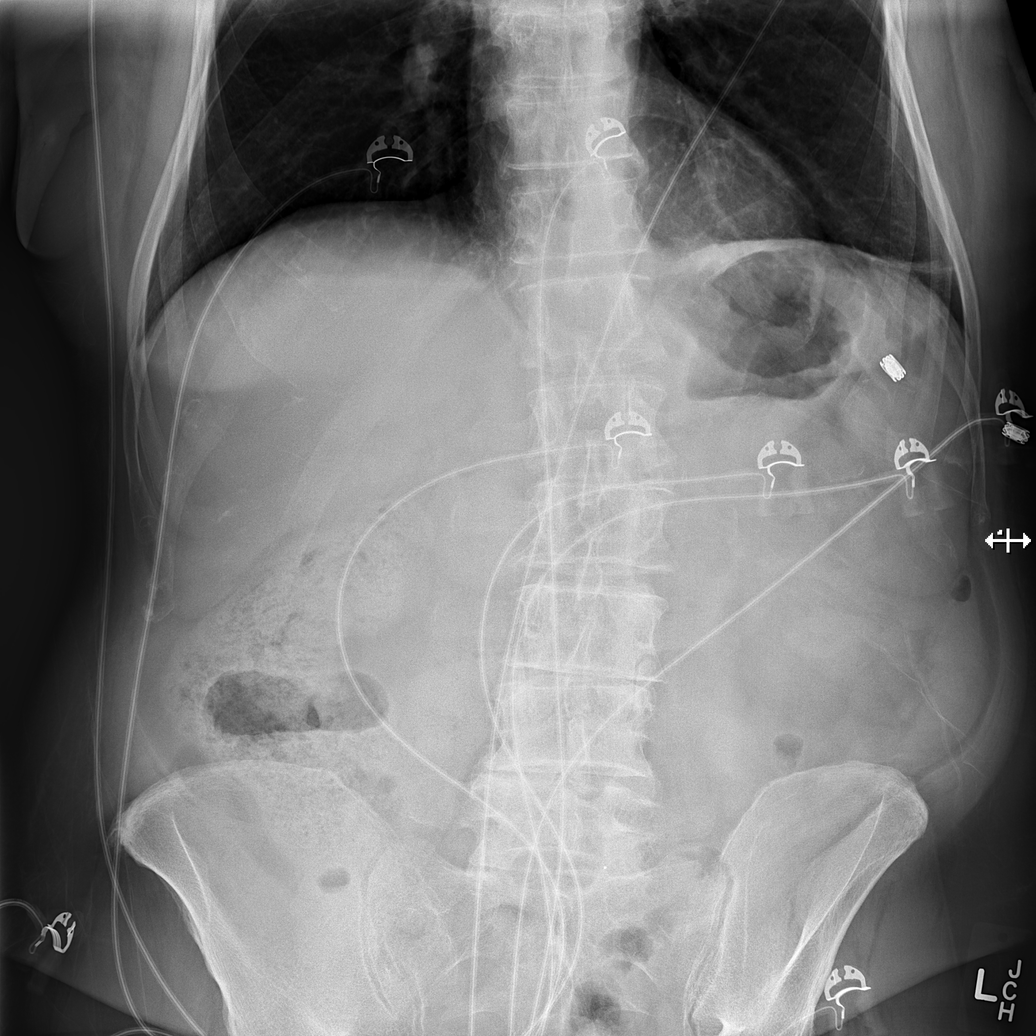

[x abdomen supine (1 of 2)]
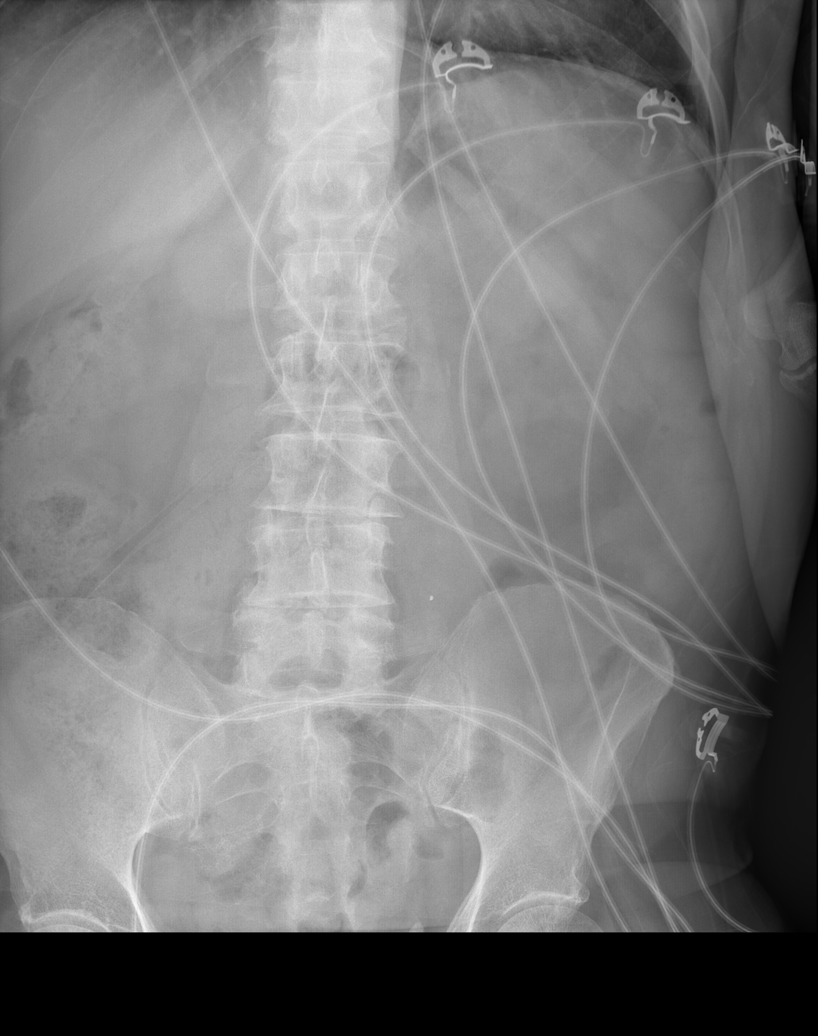

[x abdomen supine (2 of 2)]
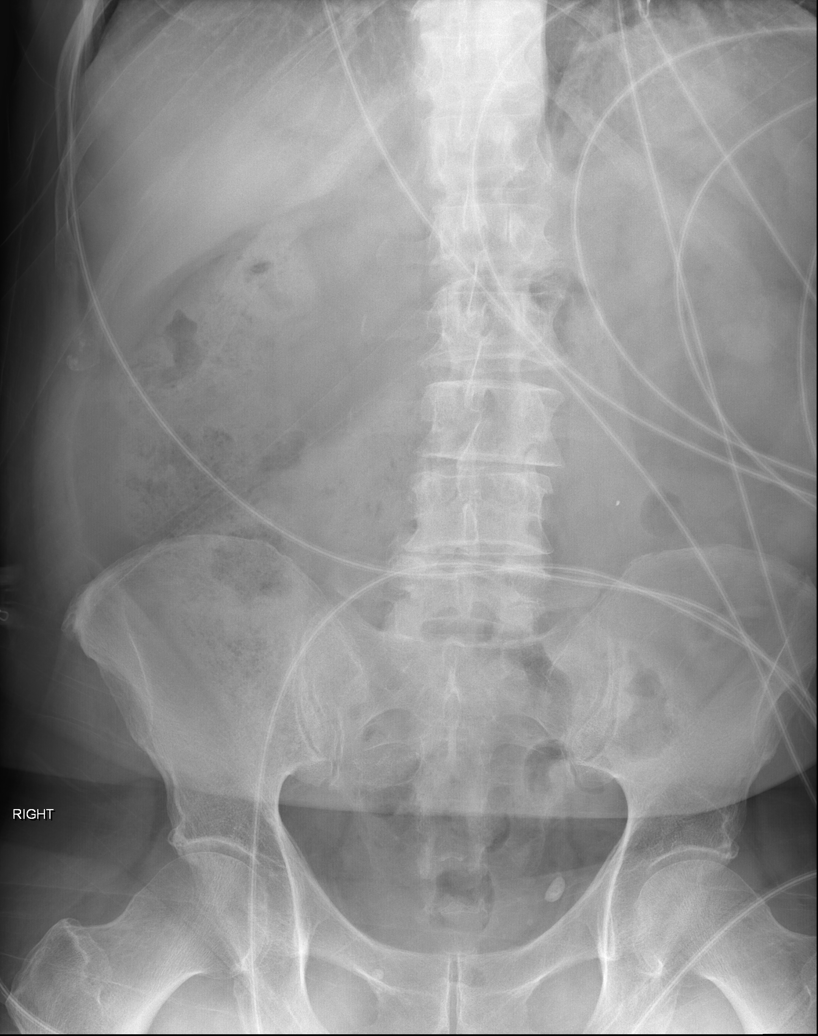

[4 of 4 positions shown; findings below may reference images not displayed]

FINDINGS: Normal heart size and mediastinal contours. No acute infiltrate or
edema. No effusion or pneumothorax. There is minimal atelectasis or
scar at the peripheral left base. Previously noted pulmonary nodules
are not visible. No acute osseous findings.

No gas dilated bowel. No abnormal fluid levels. Stool is present
within the proximal colon, reassuring finding after ileostomy
takedown. Negative for pneumoperitoneum.
IMPRESSION: Negative abdominal radiographs.  No evidence of pneumonia.

## 2015-03-10 ENCOUNTER — Ambulatory Visit (INDEPENDENT_AMBULATORY_CARE_PROVIDER_SITE_OTHER): Payer: Medicare Other

## 2015-03-10 DIAGNOSIS — Z23 Encounter for immunization: Secondary | ICD-10-CM

## 2015-03-20 ENCOUNTER — Telehealth: Payer: Self-pay | Admitting: Family Medicine

## 2015-04-14 ENCOUNTER — Encounter: Payer: Self-pay | Admitting: Cardiology

## 2015-04-14 DIAGNOSIS — Z8679 Personal history of other diseases of the circulatory system: Secondary | ICD-10-CM

## 2015-04-14 NOTE — Progress Notes (Unsigned)
Patient ID: ROCKNEY GRENZ, male   DOB: 1945-07-01, 69 y.o.   MRN: [id number]   Spike, Desilets  Date of visit:  04/14/2015 DOB:  Jul 01, 1945    Age:  69 yrs. Medical record number:  [id number]     Account number:  [id number] Primary Care Provider: Redge Gainer ____________________________ CURRENT DIAGNOSES  1. Nonrheumatic mitral (valve) insufficiency  2. Essential (primary) hypertension  3. Hyperlipidemia  4. Personal history of other malignant neoplasm of rectum, rectosigmoid junction, and anus  5. Personal history of other venous thrombosis and embolism ____________________________ ALLERGIES  Iodine, Intolerance-unknown ____________________________ MEDICATIONS  1. Afrin 0.025%, PRN  2. Centrum Silver tablet, 1 p.o. daily  3. loperamide 2 mg tablet, PRN  4. hydrocortisone 0.5 % topical cream, PRN  5. lisinopril 40 mg tablet, 1 p.o. daily  6. pravastatin 20 mg tablet, 1 p.o. daily ____________________________ CHIEF COMPLAINTS  Followup of Essential (primary) hypertension  Followup of Nonrheumatic mitral (valve) insufficiency ____________________________ HISTORY OF PRESENT ILLNESS Patient seen for cardiac followup. He has been doing well since he was previously here. He is exercising on a regular basis and his lipids were reviewed today and show good control. He denies angina and has no PND, orthopnea, syncope, palpitations, or claudication. He remains free of his previous cancer. He is happy with his level of activity and works with his chickens. History of a not having any dyspnea and does not really wish to pursue treatment of his mitral regurgitation. ____________________________ PAST HISTORY  Past Medical Illnesses:  hypertension, asthma, rheumatic fever, rectal cancer treated with Xeloda, radiation therpay and surgery;  Cardiovascular Illnesses:  mitral regurgitation due to partially torn leaflet;  Surgical Procedures:  carpal tunnel release, inguinal herniorrhaphy-left, tonsillectomy,  resection of rectal cancer;  NYHA Classification:  I;  Canadian Angina Classification:  Class 0: Asymptomatic;  Cardiology Procedures-Invasive:  no previous interventional or invasive cardiology procedures;  Cardiology Procedures-Noninvasive:  echocardiogram December 2012, echocardiogram February 2014, TEE March 2015, echocardiogram May 2016;  LVEF of 60% documented via echocardiogram on 10/27/2014,   ____________________________ REVIEW OF SYSTEMS General:  denies recent weight change, fatique or change in exercise tolerance. Ears, Nose, Throat, Mouth:  partial hearing loss, tinnitus, seasonal sinusitis Respiratory: denies dyspnea, cough, wheezing or hemoptysis. Cardiovascular:  please review HPI  Genitourinary-Male: nocturia  Musculoskeletal:  arthritis of the hands, arthritis of the left knee Hematological/Immunologic:  seasonal allergies ____________________________ PHYSICAL EXAMINATION VITAL SIGNS  Blood Pressure:  138/78 Sitting, Right arm, regular cuff   Pulse:  74/min. Weight:  178.00 lbs. Height:  70"BMI: 25  Constitutional:  pleasant white male in no acute distress Skin:  warm and dry to touch, no apparent skin lesions, or masses noted. Head:  normocephalic, balding male hair pattern Neck:  supple, no masses, thyromegaly, JVD. Carotid pulses are full and equal bilaterally without bruits. Chest:  normal symmetry, clear to auscultation. Cardiac:  regular rhythm, grade 3/6 systolic murmur left sternal border radiating to the apex radiating to the base Peripheral Pulses:  the femoral,dorsalis pedis, and posterior tibial pulses are full and equal bilaterally with no bruits auscultated. Extremities & Back:  no deformities, clubbing, cyanosis, erythema or edema observed. Normal muscle strength and tone. Neurological:  no gross motor or sensory deficits noted, affect appropriate, oriented x3. ____________________________ MOST RECENT LIPID PANEL 11/14/11  CHOL TOTL 165 mg/dl, LDL 99 calc, HDL  31 mg/dl, TRIGLYCER 173 mg/dl and CHOL/HDL 5.3 (Calc) ____________________________ IMPRESSIONS/PLAN  1. Mitral regurgitation due to ruptured chordae tendineae - currently asymptomatic 2. Hypertension  currently controlled 3. History of rectal cancer currently cancer free 4. Hyperlipidemia under treatment  Recommendations:  Previous echo showed unchanged dimensions. He does have a partially flail posterior mitral valve leaflet. Return visit in 6 months with repeat echo at that time. Continue good blood pressure control.  ____________________________ TODAYS ORDERS  1. Return Visit: 6 months  2. 2D, color flow, doppler: 6 months                       ____________________________ Cardiology Physician:  Kerry Hough. MD [facility]

## 2015-05-26 ENCOUNTER — Ambulatory Visit: Payer: Medicare Other | Admitting: Family

## 2015-05-26 ENCOUNTER — Ambulatory Visit (INDEPENDENT_AMBULATORY_CARE_PROVIDER_SITE_OTHER): Payer: Medicare Other | Admitting: Pediatrics

## 2015-05-26 VITALS — BP 142/80 | HR 82 | Temp 97.0°F | Ht 70.0 in | Wt 181.4 lb

## 2015-05-26 DIAGNOSIS — R1900 Intra-abdominal and pelvic swelling, mass and lump, unspecified site: Secondary | ICD-10-CM

## 2015-05-26 DIAGNOSIS — R1909 Other intra-abdominal and pelvic swelling, mass and lump: Secondary | ICD-10-CM

## 2015-05-26 DIAGNOSIS — R198 Other specified symptoms and signs involving the digestive system and abdomen: Secondary | ICD-10-CM

## 2015-05-26 NOTE — Progress Notes (Signed)
Subjective:    Patient ID: Tyler Diaz, male    DOB: [birth year], 69 y.o.   MRN: [id number]  CC: Swelling in groin area   HPI: Tyler Diaz is a 69 y.o. male presenting for Swelling in groin area  Swelling in L groin for 3-4 weeks Sometimes feels uncomrfortable, doesn't hurt Can always easily reduce H/o colorectal cancer, used to have colostomy Sometimes feels pressure in rectum when passing stools Has h/o of hemorrhoids, no pain with stool passage No abd pain   Depression screen [facility] 2/9 01/23/2015 08/28/2013 07/30/2013  Decreased Interest 0 0 0  Down, Depressed, Hopeless 0 0 0  PHQ - 2 Score 0 0 0     Relevant past medical, surgical, family and social history reviewed and updated as indicated. Interim medical history since our last visit reviewed. Allergies and medications reviewed and updated.    ROS: Per HPI unless specifically indicated above  History  Smoking status  . Former Smoker -- 1.50 packs/day for 20 years  . Types: Cigarettes  . Quit date: 07/27/1972  Smokeless tobacco  . Never Used    Comment: quit smoking 35 years ago    Past Medical History Patient Active Problem List   Diagnosis Date Noted  . History of SBE (subacute bacterial endocarditis) 07/06/2013  . Mitral regurgitation due to partially flail posterior mitral valve leaflet 07/30/2012  . Hyperlipidemia   . Rectal cancer - distal s/p lap LAR/coloanal July 2014 - [id number]. 07/27/2012  . Hypertension     Current Outpatient Prescriptions  Medication Sig Dispense Refill  . acetaminophen (TYLENOL) 500 MG tablet Take 500 mg by mouth every 6 (six) hours as needed for headache.    . hydrocortisone (PROCTOSOL HC) 2.5 % rectal cream Place 1 application rectally 2 (two) times daily. 30 g 1  . lisinopril (PRINIVIL,ZESTRIL) 40 MG tablet Take 1 tablet by mouth daily.  3  . Multiple Vitamin (MULTIVITAMIN WITH MINERALS) TABS Take 1 tablet by mouth every morning.     Marland Kitchen oxymetazoline (AFRIN) 0.05 %  nasal spray Place 1 spray into both nostrils 2 (two) times daily as needed for congestion.    . pravastatin (PRAVACHOL) 20 MG tablet     . Loperamide HCl (IMODIUM PO) Take 1 tablet by mouth. Reported on 05/26/2015    . promethazine (PHENERGAN) 25 MG tablet Take 25 mg by mouth every 6 (six) hours as needed for nausea. Reported on 05/26/2015    . saccharomyces boulardii (FLORASTOR) 250 MG capsule Take 1 capsule (250 mg total) by mouth 2 (two) times daily. (Patient not taking: Reported on 01/23/2015)     No current facility-administered medications for this visit.       Objective:    BP 142/80 mmHg  Pulse 82  Temp(Src) 97 F (36.1 C) (Oral)  Ht 5\' 10"  (1.778 m)  Wt 181 lb 6.4 oz (82.283 kg)  BMI 26.03 kg/m2  Wt Readings from Last 3 Encounters:  05/26/15 181 lb 6.4 oz (82.283 kg)  01/23/15 180 lb 12.8 oz (82.01 kg)  01/20/15 180 lb (81.647 kg)     Gen: NAD, alert, cooperative with exam, NCAT EYES: EOMI, no scleral injection or icterus ENT:  MMM CV: NRRR, normal S1/S2, no murmur, distal pulses 2+ b/l Resp: CTABL, no wheezes, normal WOB Abd: +BS, soft, NTND. L groin with soft palpable mass, completely reducible when pt lies down, non-tender, no erythema GU: Normal testes, no scrotal mass or swelling Ext: No edema, warm Neuro: Alert and  oriented, strength equal b/l UE and LE, coordination grossly normal MSK: normal muscle bulk Rectal: one soft hemorrhoid visualized, no masses in rectal vault, slightly decreased rectal tone     Assessment & Plan:    [name] was seen today for swelling in groin area, likely inguinal hernia. Now soft, easily reducible. WIll get ultrasound. Has been increasing over the last 3 weeks, becoming more bothersome. Will refer for surgery evaluation pending ultrasound results.  Diagnoses and all orders for this visit:  Groin swelling -     US Abdomen Limited; Future  Rectal pressure Normal exam, one soft hemmorrhoid. Discussed increasing fiber, avoid  constipation   Follow up plan: Return in about 4 weeks (around 06/23/2015), or if symptoms worsen or fail to improve.  Assunta Found, MD [facility] 05/26/2015, 5:39 PM

## 2015-05-26 NOTE — Patient Instructions (Signed)
We will call you about the ultrasound  Eat plenty of fiber daily  Stool softeners as needed

## 2015-06-02 ENCOUNTER — Ambulatory Visit (HOSPITAL_COMMUNITY)
Admission: RE | Admit: 2015-06-02 | Discharge: 2015-06-02 | Disposition: A | Discharge status: Home / Self Care | Payer: Medicare Other | Source: Ambulatory Visit | Attending: Pediatrics | Admitting: Pediatrics

## 2015-06-02 DIAGNOSIS — R1909 Other intra-abdominal and pelvic swelling, mass and lump: Secondary | ICD-10-CM | POA: Diagnosis not present

## 2015-06-02 DIAGNOSIS — R1900 Intra-abdominal and pelvic swelling, mass and lump, unspecified site: Secondary | ICD-10-CM | POA: Diagnosis present

## 2015-06-05 ENCOUNTER — Other Ambulatory Visit: Payer: Self-pay | Admitting: *Deleted

## 2015-06-05 DIAGNOSIS — K4030 Unilateral inguinal hernia, with obstruction, without gangrene, not specified as recurrent: Secondary | ICD-10-CM

## 2015-06-29 ENCOUNTER — Other Ambulatory Visit: Payer: Self-pay | Admitting: Surgery

## 2015-06-29 DIAGNOSIS — C20 Malignant neoplasm of rectum: Secondary | ICD-10-CM | POA: Diagnosis not present

## 2015-06-29 DIAGNOSIS — L290 Pruritus ani: Secondary | ICD-10-CM | POA: Diagnosis not present

## 2015-06-29 DIAGNOSIS — Z01818 Encounter for other preprocedural examination: Secondary | ICD-10-CM | POA: Diagnosis not present

## 2015-06-29 DIAGNOSIS — K4020 Bilateral inguinal hernia, without obstruction or gangrene, not specified as recurrent: Secondary | ICD-10-CM | POA: Diagnosis not present

## 2015-06-29 DIAGNOSIS — K644 Residual hemorrhoidal skin tags: Secondary | ICD-10-CM | POA: Diagnosis not present

## 2015-06-29 NOTE — H&P (Signed)
Tyler Diaz. Tyler Diaz 06/29/2015 11:12 AM Location: [facility] Patient #: [id number] DOB: September 15, 1945 Married / Language: English / Race: White Male  History of Present Illness Tyler Sportsman MD; 06/29/2015 11:50 AM) The patient is a 70 year old male who presents with colorectal cancer. Tyler Diaz returns today feeling relatively well. He status post neoadjuvant chemoradiation therapy and resection of very low rectal cancer with coloanal anastomosis July 2014. Did have some anal stricturing but eventually improved with dilation and pelvic floor therapy.  He's feeling okay. Still working in the yard. Still has some fecal urgency but no incontinence anymore. Wearing Depends when he is out "just in case." Moves his bowels usually once or twice a day. Some urgency. But much improved. Occasionally gets irritation with bowel movements. He uses Anusol intermittently. Last time his insurance company refused to refill at the. He wondered if I could do at form. No fevers or chills. Activity good. Appetite great. No evidence of recurrence by CT scan February 2016 or evaluation by medical oncology.                Tyler Diaz  May 14, 1946 [id number]  Patient Care Team: Ernestina Penna, MD as PCP - General (Family Medicine) Tyler Sportsman, MD as Consulting Physician (Colon and Rectal Surgery) Shirley Friar, MD as Consulting Physician (Gastroenterology) Lance Bosch, MD as Consulting Physician (Radiation Oncology) Cira Rue, RN as Registered Nurse (Oncology) Ladene Artist, MD as Consulting Physician (Oncology) Judyann Munson, MD as Consulting Physician (Infectious Diseases)  Procedure (Date: 06/28/2013):  fPOST-OPERATIVE DIAGNOSIS: Rectal cancer status post low anterior resection, coloanal anastomosis, diverting loop ileostomy.  Stricture of coloanal anastomosis.  PROCEDURE: Procedure(s):  loop ILEOSTOMY TAKEDOWN  Dilation of coloanal anastomosis   Examination of anesthesia  SURGEON: Surgeon(s):  Tyler Sportsman, MD  Mariella Saa, MD - Asst  Diagnosis 1. Rectum, biopsy, distal margin - BENIGN FIBROVASCULAR SOFT TISSUE. - NO TUMOR SEEN. 2. Colon, segmental resection for tumor, sigmoid - INVASIVE WELL DIFFERENTIATED ADENOCARCINOMA WITH ABUNDANT ACELLULAR MUCIN, SPANNING 4.5 CM IN GREATEST DIMENSION. - TUMOR INVADES THROUGH MUSCULARIS PROPRIA INTO PERICOLORECTAL TISSUES WITH ACELLULAR MUCIN CLOSELY APPROACHING SEROSAL SURFACE. - DISTAL MARGIN OF RESECTION SPECIMEN SHOWS INVOLVEMENT WITH ACELLULAR MUCIN (SEE FIRST SPECIMEN FOR FINAL MARGIN STATUS). - OTHER MARGINS NEGATIVE. 1 of 4 Duplicate copy FINAL for Tyler Diaz [phone]) Diagnosis(continued) - THREE LYMPH NODES POSITIVE FOR METASTATIC CARCINOMA (3/3). - FOUR ADDITIONAL LYMPH NODES WITH ACELLULAR MUCIN BUT NO RESIDUAL VIABLE TUMOR (0/4). - NINE ADDITIONAL LYMPH NODES WITH NO TUMOR SEEN (0/9). - TWO SOFT TISSUE TUMOR DEPOSITS SHOWING VIABLE TUMOR. - TEN ADDITIONAL SOFT TISSUE DEPOSITS OF ACELLULAR MUCIN. - SEE ONCOLOGY TEMPLATE. Microscopic Comment 2. COLON AND RECTUM Specimen: Sigmoid and rectal colon. Procedure: Segmental resection of colon. Tumor site: Distal third of the rectum. Specimen integrity: Intact. Macroscopic intactness of mesorectum: Incomplete. Macroscopic tumor perforation: No. Invasive tumor: Maximum size: 4.5 cm. Histologic type(s): Adenocarcinoma with abundant mucin, see below comments. Histologic grade and differentiation: G1: well differentiated/low grade. Type of polyp in which invasive carcinoma arose: Precursor polyp not identified. Microscopic extension of invasive tumor: Tumor invades through muscularis propria into pericolorectal tissues. Lymph-Vascular invasion: Definitive lymphovascular invasion is not identified; however, several lymph nodes are positive for metastatic carcinoma, see below. Peri-neural invasion: Not  identified. Tumor deposit(s) (discontinuous extramural extension): Yes, two tumor deposits with viable tumor. Resection margins: Proximal margin: Negative. Distal margin: Negative (see specimen #2). Circumferential (radial) (posterior ascending, posterior descending; lateral and  posterior mid-rectum; and entire lower 1/3 rectum): Radial margin is negative. Mesenteric margin (sigmoid and transverse): Negative. Treatment effect (neoadjuvant therapy): There is evidence of treatment effect both in the primary tumor and in the lymph nodes. Number of lymph nodes examined 16 ; Number positive: 3, see below comments. Additional polyp(s): None. Non-neoplastic findings: No additional significant pathologic findings. Pathologic Staging: ypT3, ypN1b, MX. Ancillary studies: The tumor will be sent for MSI PCR and MMR IHC and this result will be reported in an addendum per GI tumor protocol. Comments: The tumor is a well differentiated adenocarcinoma with abundant mucin. 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 Dr. Frederica Kuster has seen this case in consultation with agreement of the tumor staging. (RAH:caf 12/31/12) Zandra Abts MD Pathologist, Electronic Signature (Case signed 12/31/2012) 2 of 4 Duplicate copy FINAL for [name], Tyler Diaz [phone]) Intraoperative Diagnosis RAPID INTRAOPERATIVE CONSULT: RECTAL BIOPSY, DISTAL MARGIN, FROZEN SECTION - NO VIABLE MALIGNANCY IDENTIFIED.(JBK) Specimen Tyler Diaz and Clinical Information Specimen(s) Obtained: 1. Rectum, biopsy, distal margin 2. Colon, segmental resection for  tumor, sigmoid Specimen Clinical Information 1. very distal rectal cancer (kp) Tyler Diaz 1. Received fresh is a 1 x 0.6 x 0.3 cm tan-red soft tissue with possible smooth glistening mucosa along one edge. The specimen is entirely submitted in one block for frozen section. 2. Specimen: Sigmoid rectal colon, which includes at last a portion of distal third of rectum. Specimen integrity: Intact. Specimen length: 28 cm. Mesorectal intactness: The mesorectum is incomplete, ranging from 1.2 to 2.7 cm in thickness. Tumor location: Distal third of rectum. Tumor size: There is a 2 cm in length and 4.5 cm in width firm, tan to tan-red centrally depressed mass with rolled edges. The central depression is up to 0.7 cm deep. Percent of bowel circumference involved: 50%. Tumor distance to margins: Proximal: 23 cm. Distal: 3 cm, with tan-red granular mucosa between the mass and distal margin. Mesenteric (sigmoid and transverse): 6 cm. Radial (posterior ascending, posterior descending; lateral and posterior mid-rectum; and entire lower 1/3 rectum): In the area where the mesorectum is incomplete, the tumor is grossly adjacent to the inked perirectal soft tissue margin. Macroscopic extent of tumor invasion: Tumor is up to 1.7 cm thick, transecting the muscularis and invading into underlying perirectal soft tissue. Total presumed lymph nodes: Thirty possible lymph nodes ranging from 0.2 to 1.5 cm. Extramural satellite tumor nodules: None. Mucosal polyp(s): None. Additional findings: None. Block summary: A = proximal margin. B = distal margin. C - G = mass, full thickness with nearest radial margin. H = tissue for molecular testing. I = five nodes. J = four nodes. Diaz = four nodes. L = four nodes. M = four nodes. N = four nodes. O = three nodes. P = two nodes. Total, 16 blocks. (SSW:ecj 12/28/2012) 3 of 4 Duplicate copy FINAL for ELLINGTON, KANEKO ([id number]) Report signed out from the following  location(s) Technical Component performed at [facility]. [address],[place] [address].CLIA:[id number],CAP:[id number]., Technical Component performed at [facility] [address], [place], [place] [address]. CLIA #: [id number], Interpretation performed at [facility] [address], , [place] [address]. CLIA #:  [id number],            1. Stage III rectal cancer, presenting with a locally advanced rectal tumor.  CT/MRI evidence of perirectal lymphadenopathy.   Status post neoadjuvant Xeloda and radiation.   Status post low anterior resection and coloanal anastomosis 12/27/2012 with pathology confirming a ypT3, ypN1b tumor, microsatellite stable.   Initiation of adjuvant CAPOX chemotherapy 01/30/2013.   Cycle 2 adjuvant CAPOX 02/20/2013.   Cycle 3 adjuvant CAPOX 03/13/2013.   Cycle 4 adjuvant CAPOX 04/03/2013 (oxaliplatin dose reduced with cycle 4 due to neuropathy, Xeloda dose reduced with cycle 4 due to hand-foot syndrome).   Cycle 5 adjuvant CAPOX 04/24/2013.  Surveillance colonoscopy 12/04/2013. Perianal erythema and tight anorectal sphincter. One 4 mm polyp in the sigmoid colon completely resected. Diverticulosis in the sigmoid colon. Repeat colonoscopy in 3 years for surveillance.  CTs 07/15/2004 with stable lung nodules, no evidence of metastatic disease 2. Small bilateral lung nodules and a mildly enlarged gastrohepatic node on the initial staging CT scans 08/02/2012.  CT chest on 01/23/2013 with stable bilateral pulmonary nodules.  CT chest 07/15/2004 with stable lung nodules   Additional reasons for visit:  Inguinal hernia is described as the following: Note for "Inguinal hernia": Patient permanent) with LEFT groin swelling. Concern for inguinal hernia. He noticed a few months ago. He thinks is getting larger. No history of fall or trauma. No lumps in his abdomen. His have some issues with  nocturia once or twice a night. Sometimes a little challenging to start the stream but no major changes in the past few years. His due to follow up with medical oncology get a CAT scan next month. He wonders if he should have surgery on the same day or not.   Allergies Fay Records, CMA; 06/29/2015 11:13 AM) Shrimp Flavor *PHARMACEUTICAL ADJUVANTS* Nausea, Vomiting, Rash, Hives.  Medication History Fay Records, CMA; 06/29/2015 11:13 AM) Proctosol HC (2.5% Cream, Rectal) Active. Hydrocortisone (1% Lotion, External) Active. Centrum (Oral) Active. Medications Reconciled Lisinopril (40MG  Tablet, Oral) Active. Pravastatin Sodium (20MG  Tablet, Oral) Active. Multivitamins (Oral) Active.     Review of Systems Tyler Sportsman, MD; 06/29/2015 11:18 AM) Skin Present- Ulcer. Not Present- Change in Wart/Mole, Dryness, Hives, Jaundice, New Lesions, Non-Healing Wounds and Rash. HEENT Present- Hearing Loss, Ringing in the Ears, Seasonal Allergies and Wears glasses/contact lenses. Not Present- Earache, Hoarseness, Nose Bleed, Oral Ulcers, Sinus Pain, Sore Throat, Visual Disturbances and Yellow Eyes.  Vitals Fay Records CMA; 06/29/2015 11:14 AM) 06/29/2015 11:14 AM Weight: 184.2 lb Height: 70in Body Surface Area: 2.02 m Body Mass Index: 26.43 kg/m  Temp.: 98.67F(Temporal)  Pulse: 82 (Regular)  BP: 130/70 (Sitting, Left Arm, Standard)      Physical Exam Tyler Sportsman MD; 06/29/2015 11:46 AM)  General Mental Status-Alert. General Appearance-Not in acute distress, Not Sickly. Orientation-Oriented X3. Hydration-Well hydrated. Voice-Normal.  Integumentary Global Assessment Upon inspection and palpation of skin surfaces of the - Axillae: non-tender, no inflammation or ulceration, no drainage. and Distribution of scalp and body hair is normal. General Characteristics Temperature - normal warmth is noted.  Head and Neck Head-normocephalic, atraumatic with no  lesions or palpable masses. Face Global Assessment - atraumatic, no absence of expression. Neck Global Assessment - no abnormal movements, no bruit auscultated on the right, no bruit auscultated on the left, no decreased range of motion, non-tender. Trachea-midline. Thyroid Gland Characteristics - non-tender.  Eye Eyeball - Left-Extraocular movements intact, No Nystagmus. Eyeball - Right-Extraocular movements intact, No Nystagmus. Cornea - Left-No Hazy. Cornea -  Right-No Hazy. Sclera/Conjunctiva - Left-No scleral icterus, No Discharge. Sclera/Conjunctiva - Right-No scleral icterus, No Discharge. Pupil - Left-Direct reaction to light normal. Pupil - Right-Direct reaction to light normal. Note: Wears glasses  ENMT Ears Pinna - Left - no drainage observed, no generalized tenderness observed. Right - no drainage observed, no generalized tenderness observed. Nose and Sinuses External Inspection of the Nose - no destructive lesion observed. Inspection of the nares - Left - quiet respiration. Right - quiet respiration. Mouth and Throat Lips - Upper Lip - no fissures observed, no pallor noted. Lower Lip - no fissures observed, no pallor noted. Nasopharynx - no discharge present. Oral Cavity/Oropharynx - Tongue - no dryness observed. Oral Mucosa - no cyanosis observed. Hypopharynx - no evidence of airway distress observed.  Chest and Lung Exam Inspection Movements - Normal and Symmetrical. Accessory muscles - No use of accessory muscles in breathing. Palpation Palpation of the chest reveals - Non-tender. Auscultation Breath sounds - Normal and Clear.  Cardiovascular Auscultation Rhythm - Regular. Murmurs & Other Heart Sounds - Auscultation of the heart reveals - No Murmurs and No Systolic Clicks.  Abdomen Inspection Inspection of the abdomen reveals - No Visible peristalsis and No Abnormal pulsations. Umbilicus - No Bleeding, No Urine  drainage. Palpation/Percussion Palpation and Percussion of the abdomen reveal - Soft, Non Tender, No Rebound tenderness, No Rigidity (guarding) and No Cutaneous hyperesthesia. Note: Soft and flat. No umbilical hernia. RIGHT lower quadrant transverse old ileostomy incision well healed. No hernia. No abdominal pain.  Male Genitourinary Sexual Maturity Tanner 5 - Adult hair pattern and Adult penile size and shape. Note: Normal external male genitalia. Obvious LEFT inguinal hernia. Sensitive but reducible. Very sensitive external ring suspicious for RIGHT inguinal hernia as well.  Peripheral Vascular Upper Extremity Inspection - Left - No Cyanotic nailbeds, Not Ischemic. Right - No Cyanotic nailbeds, Not Ischemic.  Neurologic Neurologic evaluation reveals -normal attention span and ability to concentrate, able to name objects and repeat phrases. Appropriate fund of knowledge , normal sensation and normal coordination. Mental Status Affect - not angry, not paranoid. Cranial Nerves-Normal Bilaterally. Gait-Normal.  Neuropsychiatric Mental status exam performed with findings of-able to articulate well with normal speech/language, rate, volume and coherence, thought content normal with ability to perform basic computations and apply abstract reasoning and no evidence of hallucinations, delusions, obsessions or homicidal/suicidal ideation.  Musculoskeletal Global Assessment Spine, Ribs and Pelvis - no instability, subluxation or laxity. Right Upper Extremity - no instability, subluxation or laxity.  Lymphatic Head & Neck  General Head & Neck Lymphatics: Bilateral - Description - No Localized lymphadenopathy. Axillary  General Axillary Region: Bilateral - Description - No Localized lymphadenopathy. Femoral & Inguinal  Generalized Femoral & Inguinal Lymphatics: Left - Description - No Localized lymphadenopathy. Right - Description - No Localized lymphadenopathy.    Assessment  & Plan Tyler Sportsman MD; 06/29/2015 11:53 AM)  BILATERAL INGUINAL HERNIA WITHOUT OBSTRUCTION OR GANGRENE, RECURRENCE NOT SPECIFIED (K40.20) Impression: Moderate but reducible LEFT inguinal hernia. Sensitive RIGHT external ring was probable small hernia as well.  I think he would benefit from surgical repair.  Ideally would do this after his next CT scan which I believe is in a few weeks. That way and there is no confusion on reading the PET scan  He does have some urinary symptoms at night. Would like to put him on Flomax preoperatively to minimize the risk of urinary retention. He might go home with a Foley catheter to be removed in the morning of  postoperative day one to minimize urinary retention.  Current Plans Started Flomax 0.4MG , 1 (one) Capsule daily, #14, 14 days starting 06/29/2015, Ref. x2. Local Order: Start 7 days prior to surgery to shrink the prostate and help with urination ENCOUNTER FOR PRE-OPERATIVE EXAMINATION - [facility] (Y78.295)  Current Plans You are being scheduled for surgery - Our schedulers will call you.  You should hear from our office's scheduling department within 5 working days about the location, date, and time of surgery. We try to make accommodations for patient's preferences in scheduling surgery, but sometimes the OR schedule or the surgeon's schedule prevents [place] from making those accommodations.  If you have not heard from our office [phone]) in 5 working days, call the office and ask for your surgeon's nurse.  If you have other questions about your diagnosis, plan, or surgery, call the office and ask for your surgeon's nurse.  Written instructions provided Pt Education - Pamphlet Given - Laparoscopic Hernia Repair: discussed with patient and provided information. The anatomy & physiology of the abdominal wall was discussed. The pathophysiology of hernias was discussed. Natural history risks without surgery including progeressive enlargement, pain,  incarceration, & strangulation was discussed. Contributors to complications such as smoking, obesity, diabetes, prior surgery, etc were discussed.  I feel the risks of no intervention will lead to serious problems that outweigh the operative risks; therefore, I recommended surgery to reduce and repair the hernia. I explained laparoscopic techniques with possible need for an open approach. I noted the probable use of mesh to patch and/or buttress the hernia repair  Risks such as bleeding, infection, abscess, need for further treatment, heart attack, death, and other risks were discussed. I noted a good likelihood this will help address the problem. Goals of post-operative recovery were discussed as well. Possibility that this will not correct all symptoms was explained. I stressed the importance of low-impact activity, aggressive pain control, avoiding constipation, & not pushing through pain to minimize risk of post-operative chronic pain or injury. Possibility of reherniation especially with smoking, obesity, diabetes, immunosuppression, and other health conditions was discussed. We will work to minimize complications.  An educational handout further explaining the pathology & treatment options was given as well. Questions were answered. The patient expresses understanding & wishes to proceed with surgery.  PRIMARY CANCER OF RECTUM (C20) Impression: No evidence of local or distant recurrence 2 years status post neoadjuvant chemoradiation therapy for very low rectal cancer requiring to stay surgery with handsewn coloanal anastomosis and diverting ileostomy. Status post ileostomy takedown in January 2015. Analgesia with shingles, line infections, C. difficile. No problems in over a year.  I think he is doing relatively well. Recommend fiber bowel regimen to have well-formed stools. He seems to prefer Imodium to avoid too frequent loose stools.  Follow-up annually until no evidence of local recurrence at  5 years.  Follow-up radiographs per medical oncology February 2017  Current Plans Pt Education - [facility] Rectal Cancer (low) followup (Katherin Ramey) Pt Education - [facility] Pelvic Floor Exercises (Kegels) and Dysfunction HCI (Karrin Eisenmenger) Pt Education - [facility]CS Good Bowel Health (Dustine Bertini) Pt Education - Patient education: Colon and rectal cancer (Beyond the Basics): discussed with patient and provided information. Follow up with [place] in the office in 9 MONTHS.  Call us sooner as needed.  PRURITUS ANI (L29.0) Impression: Mild perianal irritation bit no major rash. No prolapse. Normal sphincter tone. No stricturing.  We'll see if insurance will cover Anusol from me since over-the-counter options are not effective.  Current  Plans Pt Education - CCS Pruritus Ani (AT) EXTERNAL HEMORRHOIDS WITH COMPLICATION (K64.4) Impression: Some irritation with his external hemorrhoid. Not severe but seems to be managed on Anusol. Continue.  The anatomy & physiology of the anorectal region was discussed. The pathophysiology of hemorrhoids and differential diagnosis was discussed. Natural history progression was discussed. I stressed the importance of a bowel regimen to have daily soft bowel movements to minimize progression of disease. Goal of one BM / day ideal. Use of wet wipes, warm baths, avoiding straining, etc were emphasized.  Educational handouts further explaining the pathology, treatment options, and bowel regimen were given as well. The patient expressed understanding.  Current Plans  Tyler Diaz, M.D., F.A.C.S. Gastrointestinal and Minimally Invasive Surgery [facility] [address] [place], [place] [address] [phone] Main / Paging

## 2015-07-20 ENCOUNTER — Other Ambulatory Visit (HOSPITAL_BASED_OUTPATIENT_CLINIC_OR_DEPARTMENT_OTHER): Payer: PPO

## 2015-07-20 ENCOUNTER — Encounter (HOSPITAL_COMMUNITY): Payer: Self-pay

## 2015-07-20 ENCOUNTER — Ambulatory Visit (HOSPITAL_COMMUNITY)
Admission: RE | Admit: 2015-07-20 | Discharge: 2015-07-20 | Disposition: A | Discharge status: Home / Self Care | Payer: PPO | Source: Ambulatory Visit | Attending: Nurse Practitioner | Admitting: Nurse Practitioner

## 2015-07-20 DIAGNOSIS — Z85048 Personal history of other malignant neoplasm of rectum, rectosigmoid junction, and anus: Secondary | ICD-10-CM

## 2015-07-20 DIAGNOSIS — Z9889 Other specified postprocedural states: Secondary | ICD-10-CM | POA: Insufficient documentation

## 2015-07-20 DIAGNOSIS — Z923 Personal history of irradiation: Secondary | ICD-10-CM | POA: Insufficient documentation

## 2015-07-20 DIAGNOSIS — C20 Malignant neoplasm of rectum: Secondary | ICD-10-CM | POA: Diagnosis not present

## 2015-07-20 DIAGNOSIS — K4020 Bilateral inguinal hernia, without obstruction or gangrene, not specified as recurrent: Secondary | ICD-10-CM | POA: Insufficient documentation

## 2015-07-20 DIAGNOSIS — N400 Enlarged prostate without lower urinary tract symptoms: Secondary | ICD-10-CM | POA: Insufficient documentation

## 2015-07-20 DIAGNOSIS — R1030 Lower abdominal pain, unspecified: Secondary | ICD-10-CM | POA: Insufficient documentation

## 2015-07-20 DIAGNOSIS — Z9221 Personal history of antineoplastic chemotherapy: Secondary | ICD-10-CM | POA: Insufficient documentation

## 2015-07-20 DIAGNOSIS — K8020 Calculus of gallbladder without cholecystitis without obstruction: Secondary | ICD-10-CM | POA: Diagnosis not present

## 2015-07-20 LAB — COMPREHENSIVE METABOLIC PANEL
ALBUMIN: 4.2 g/dL (ref 3.5–5.0)
ALK PHOS: 48 U/L (ref 40–150)
ALT: 23 U/L (ref 0–55)
ANION GAP: 10 meq/L (ref 3–11)
AST: 20 U/L (ref 5–34)
BUN: 19.9 mg/dL (ref 7.0–26.0)
CALCIUM: 9.5 mg/dL (ref 8.4–10.4)
CO2: 27 mEq/L (ref 22–29)
Chloride: 106 mEq/L (ref 98–109)
Creatinine: 1.2 mg/dL (ref 0.7–1.3)
EGFR: 63 mL/min/{1.73_m2} — AB (ref >90)
Glucose: 97 mg/dl (ref 70–140)
POTASSIUM: 4.7 meq/L (ref 3.5–5.1)
SODIUM: 142 meq/L (ref 136–145)
Total Bilirubin: 1.6 mg/dL — ABNORMAL HIGH (ref 0.20–1.20)
Total Protein: 7 g/dL (ref 6.4–8.3)

## 2015-07-20 LAB — CBC WITH DIFFERENTIAL/PLATELET
BASO%: 1.3 % (ref 0.0–2.0)
BASOS ABS: 0.1 10*3/uL (ref 0.0–0.1)
EOS ABS: 0.4 10*3/uL (ref 0.0–0.5)
EOS%: 4.7 % (ref 0.0–7.0)
HEMATOCRIT: 41.4 % (ref 38.4–49.9)
HEMOGLOBIN: 13.8 g/dL (ref 13.0–17.1)
LYMPH%: 14.6 % (ref 14.0–49.0)
MCH: 33.1 pg (ref 27.2–33.4)
MCHC: 33.4 g/dL (ref 32.0–36.0)
MCV: 99.3 fL — AB (ref 79.3–98.0)
MONO#: 0.8 10*3/uL (ref 0.1–0.9)
MONO%: 10.7 % (ref 0.0–14.0)
NEUT#: 5.2 10*3/uL (ref 1.5–6.5)
NEUT%: 68.7 % (ref 39.0–75.0)
PLATELETS: 173 10*3/uL (ref 140–400)
RBC: 4.17 10*6/uL — ABNORMAL LOW (ref 4.20–5.82)
RDW: 14.3 % (ref 11.0–14.6)
WBC: 7.6 10*3/uL (ref 4.0–10.3)
lymph#: 1.1 10*3/uL (ref 0.9–3.3)

## 2015-07-20 MED ORDER — IOHEXOL 300 MG/ML  SOLN
100.0000 mL | Freq: Once | INTRAMUSCULAR | Status: AC | PRN
  Administered 2015-07-20: 100 mL via INTRAVENOUS

## 2015-07-21 LAB — CEA (PARALLEL TESTING): CEA: 0.5 ng/mL

## 2015-07-21 LAB — CEA: CEA: 1.6 ng/mL (ref 0.0–4.7)

## 2015-07-23 ENCOUNTER — Telehealth: Payer: Self-pay | Admitting: Oncology

## 2015-07-23 ENCOUNTER — Ambulatory Visit (HOSPITAL_BASED_OUTPATIENT_CLINIC_OR_DEPARTMENT_OTHER): Payer: PPO | Admitting: Oncology

## 2015-07-23 VITALS — BP 138/75 | HR 79 | Temp 97.7°F | Resp 18 | Ht 70.0 in | Wt 181.8 lb

## 2015-07-23 DIAGNOSIS — C20 Malignant neoplasm of rectum: Secondary | ICD-10-CM

## 2015-07-23 DIAGNOSIS — Z85048 Personal history of other malignant neoplasm of rectum, rectosigmoid junction, and anus: Secondary | ICD-10-CM | POA: Diagnosis not present

## 2015-07-23 NOTE — Progress Notes (Signed)
[facility] OFFICE PROGRESS NOTE   Diagnosis: Rectal cancer  INTERVAL HISTORY:   [facility] returns as scheduled. He reports mild remaining neuropathy symptoms at the hands and feet. He feels well. The left inguinal hernia is uncomfortable. He plans to have the hernia repair by Dr. Gross.  Objective:  Vital signs in last 24 hours:  Blood pressure 138/75, pulse 79, temperature 97.7 F (36.5 C), temperature source Oral, resp. rate 18, height 5' 10" (1.778 m), weight 181 lb 12.8 oz (82.464 kg), SpO2 98 %.    HEENT: Neck without mass Lymphatics: No cervical, supraclavicular, axillary, or inguinal nodes Resp: Lungs clear bilaterally Cardio: Regular rate and rhythm GI: No hepatomegaly, no mass, nontender, left inguinal hernia Vascular: No leg edema   Lab Results:  Lab Results  Component Value Date   WBC 7.6 07/20/2015   HGB 13.8 07/20/2015   HCT 41.4 07/20/2015   MCV 99.3* 07/20/2015   PLT 173 07/20/2015   NEUTROABS 5.2 07/20/2015   CEA 0.5 on 07/20/2015   Imaging:  Ct Chest W Contrast  07/20/2015  CLINICAL DATA:  Rectal cancer, chemotherapy and radiation therapy complete. Bilateral lower abdominal pain. EXAM: CT CHEST, ABDOMEN, AND PELVIS WITH CONTRAST TECHNIQUE: Multidetector CT imaging of the chest, abdomen and pelvis was performed following the standard protocol during bolus administration of intravenous contrast. CONTRAST:  100mL OMNIPAQUE IOHEXOL 300 MG/ML  SOLN COMPARISON:  07/15/2014. FINDINGS: CT CHEST FINDINGS Mediastinum/Lymph Nodes: Mediastinal and hilar lymph nodes are not enlarged by CT size criteria. No axillary adenopathy. Heart size normal. No pericardial effusion. Lungs/Pleura: Scattered pulmonary nodules measure up to 8 mm in the right lower lobe (Image 35), unchanged. No pleural fluid. Airway is unremarkable. Musculoskeletal: No worrisome lytic or sclerotic lesions. Degenerative changes are seen in the spine. CT ABDOMEN PELVIS FINDINGS  Hepatobiliary: The liver is unremarkable. Gallbladder is filled with stones. No biliary ductal dilatation. Pancreas: Negative. Spleen: Negative. Adrenals/Urinary Tract: Adrenal glands are unremarkable. Low-attenuation lesions in the right kidney measure up to 11 mm and are likely cysts, statistically. Kidneys are otherwise unremarkable. Ureters are decompressed. Bladder is grossly unremarkable. Stomach/Bowel: Stomach, small bowel and appendix are unremarkable. Left hemicolectomy. Colon is otherwise unremarkable. Rectum is collapsed, limiting evaluation. Vascular/Lymphatic: Vascular structures are unremarkable. No pathologically enlarged lymph nodes. 3 mm perirectal lymph node (series 2, image 116), likely stable. Reproductive: Prostate is slightly enlarged. Other: Bilateral inguinal hernias contain fat. Presacral soft tissue thickening, stable. Musculoskeletal: No worrisome lytic or sclerotic lesions. Degenerative changes are seen in the spine. IMPRESSION: 1. Postoperative changes of left hemicolectomy. Stable presacral soft tissue thickening and 3 mm perirectal lymph node. 2. Cholelithiasis. 3. Bilateral inguinal hernias contain fat. 4. Enlarged prostate. Electronically Signed   By: Melinda  Blietz M.D.   On: 07/20/2015 12:56   Ct Abdomen Pelvis W Contrast  07/20/2015  CLINICAL DATA:  Rectal cancer, chemotherapy and radiation therapy complete. Bilateral lower abdominal pain. EXAM: CT CHEST, ABDOMEN, AND PELVIS WITH CONTRAST TECHNIQUE: Multidetector CT imaging of the chest, abdomen and pelvis was performed following the standard protocol during bolus administration of intravenous contrast. CONTRAST:  100mL OMNIPAQUE IOHEXOL 300 MG/ML  SOLN COMPARISON:  07/15/2014. FINDINGS: CT CHEST FINDINGS Mediastinum/Lymph Nodes: Mediastinal and hilar lymph nodes are not enlarged by CT size criteria. No axillary adenopathy. Heart size normal. No pericardial effusion. Lungs/Pleura: Scattered pulmonary nodules measure up to 8 mm  in the right lower lobe (Image 35), unchanged. No pleural fluid. Airway is unremarkable. Musculoskeletal: No worrisome lytic or sclerotic lesions.   Degenerative changes are seen in the spine. CT ABDOMEN PELVIS FINDINGS Hepatobiliary: The liver is unremarkable. Gallbladder is filled with stones. No biliary ductal dilatation. Pancreas: Negative. Spleen: Negative. Adrenals/Urinary Tract: Adrenal glands are unremarkable. Low-attenuation lesions in the right kidney measure up to 11 mm and are likely cysts, statistically. Kidneys are otherwise unremarkable. Ureters are decompressed. Bladder is grossly unremarkable. Stomach/Bowel: Stomach, small bowel and appendix are unremarkable. Left hemicolectomy. Colon is otherwise unremarkable. Rectum is collapsed, limiting evaluation. Vascular/Lymphatic: Vascular structures are unremarkable. No pathologically enlarged lymph nodes. 3 mm perirectal lymph node (series 2, image 116), likely stable. Reproductive: Prostate is slightly enlarged. Other: Bilateral inguinal hernias contain fat. Presacral soft tissue thickening, stable. Musculoskeletal: No worrisome lytic or sclerotic lesions. Degenerative changes are seen in the spine. IMPRESSION: 1. Postoperative changes of left hemicolectomy. Stable presacral soft tissue thickening and 3 mm perirectal lymph node. 2. Cholelithiasis. 3. Bilateral inguinal hernias contain fat. 4. Enlarged prostate. Electronically Signed   By: Lorin Picket M.D.   On: 07/20/2015 12:56    Medications: I have reviewed the patient's current medications.  Assessment/Plan: 1. Stage III rectal cancer, presenting with a locally advanced rectal tumor. Diagnosed February 2014  CT/MRI evidence of perirectal lymphadenopathy.   Status post neoadjuvant Xeloda and radiation at the [facility].   Status post low anterior resection and coloanal anastomosis 12/27/2012 with pathology confirming a ypT3, ypN1b tumor, microsatellite stable.   Initiation of  adjuvant CAPOX chemotherapy 01/30/2013.   Cycle 2 adjuvant CAPOX 02/20/2013.   Cycle 3 adjuvant CAPOX 03/13/2013.   Cycle 4 adjuvant CAPOX 04/03/2013 (oxaliplatin dose reduced with cycle 4 due to neuropathy, Xeloda dose reduced with cycle 4 due to hand-foot syndrome).   Cycle 5 adjuvant CAPOX 04/24/2013.  Surveillance colonoscopy 12/04/2013. Perianal erythema and tight anorectal sphincter. One 4 mm polyp in the sigmoid colon completely resected. Diverticulosis in the sigmoid colon. Repeat colonoscopy in 3 years for surveillance.  CTs 07/15/2004 with stable lung nodules, no evidence of metastatic disease  CTs 07/20/2015-no evidence of metastatic disease 2. Small bilateral lung nodules and a mildly enlarged gastrohepatic node on the initial staging CT scans 08/02/2012.  CT chest on 01/23/2013 with stable bilateral pulmonary nodules.  CT chest 07/15/2004 with stable lung nodules  CT chest 07/20/2015-stable lung nodules 3. Elevated pretreatment CEA.  4. Mitral regurgitation. 5. Hypertension. 6. History of rheumatic fever. 7. Pain and swelling at the right arm on 01/18/2013.  Doppler confirmed a right upper extremity deep vein thrombosis.   Daily Lovenox started on 01/18/2013.  8. PICC and Lovenox discontinued after an office visit 05/21/2013.  9. Acute herpes zoster affecting right thoracic dermatomes September 2014. He completed a course of valacyclovir. 10. Pain related to herpes zoster. Resolved. 11. Delayed nausea following cycle 1 CAPOX. Aloxi added to the premedication regimen beginning with cycle 2. 12. Diarrhea following cycle 2 CAPOX. 13. History of hand/foot syndrome secondary to Xeloda. 14. Ileostomy takedown procedure 06/28/2013. 15. Enterococcus bacteremia, probable endocarditis 07/05/2013.  16. PICC line placement for IV antibiotics 07/11/2013. 23. C. difficile colitis March 2015   Disposition:  Mr. Bracewell remains in clinical remission from rectal cancer.  He will return for an office visit and CEA in 6 months. He is planning to schedule a hernia repair procedure with Dr. Johney Maine.  [name], MD  07/23/2015  12:16 PM

## 2015-07-23 NOTE — Telephone Encounter (Signed)
Pt confirmed labs/ov per 02/16 POF, gave pt AVS and Calendar.... KJ °

## 2015-08-05 NOTE — Patient Instructions (Addendum)
[facility]  08/05/2015   Your procedure is scheduled on: Thursday 08/13/2015  Report to [facility]  Entrance take [place]  elevators to 3rd floor to  [place] at  [place]  AM.  Call this number if you have problems the morning of surgery [phone]   Remember: ONLY 1 PERSON MAY GO WITH YOU TO SHORT STAY TO GET  READY MORNING OF [place].   Do not eat food or drink liquids :After Midnight.     Take these medicines the morning of surgery with A SIP OF WATER: none                                You may not have any metal on your body including hair pins and              piercings  Do not wear jewelry, make-up, lotions, powders or perfumes, deodorant             Do not wear nail polish.  Do not shave  48 hours prior to surgery.              Men may shave face and neck.   Do not bring valuables to the hospital. [place].  Contacts, dentures or bridgework may not be worn into surgery.  Leave suitcase in the car. After surgery it may be brought to your room.     Patients discharged the day of surgery will not be allowed to drive home.  Name and phone number of your driver: Shantel Wiedmaier - son  Special Instructions: N/A              Please read over the following fact sheets you were given: _____________________________________________________________________             [facility] - Preparing for Surgery Before surgery, you can play an important role.  Because skin is not sterile, your skin needs to be as free of germs as possible.  You can reduce the number of germs on your skin by washing with CHG (chlorahexidine gluconate) soap before surgery.  CHG is an antiseptic cleaner which kills germs and bonds with the skin to continue killing germs even after washing. Please DO NOT use if you have an allergy to CHG or antibacterial soaps.  If your skin becomes reddened/irritated stop using the CHG and  inform your nurse when you arrive at Short Stay. Do not shave (including legs and underarms) for at least 48 hours prior to the first CHG shower.  You may shave your face/neck. Please follow these instructions carefully:  1.  Shower with CHG Soap the night before surgery and the  morning of Surgery.  2.  If you choose to wash your hair, wash your hair first as usual with your  normal  shampoo.  3.  After you shampoo, rinse your hair and body thoroughly to remove the  shampoo.                           4.  Use CHG as you would any other liquid soap.  You can apply chg directly  to the skin and wash  Gently with a scrungie or clean washcloth.  5.  Apply the CHG Soap to your body ONLY FROM THE NECK DOWN.   Do not use on face/ open                           Wound or open sores. Avoid contact with eyes, ears mouth and genitals (private parts).                       Wash face,  Genitals (private parts) with your normal soap.             6.  Wash thoroughly, paying special attention to the area where your surgery  will be performed.  7.  Thoroughly rinse your body with warm water from the neck down.  8.  DO NOT shower/wash with your normal soap after using and rinsing off  the CHG Soap.                9.  Pat yourself dry with a clean towel.            10.  Wear clean pajamas.            11.  Place clean sheets on your bed the night of your first shower and do not  sleep with pets. Day of Surgery : Do not apply any lotions/deodorants the morning of surgery.  Please wear clean clothes to the hospital/surgery center.  FAILURE TO FOLLOW THESE INSTRUCTIONS MAY RESULT IN THE CANCELLATION OF YOUR SURGERY PATIENT SIGNATURE_________________________________  NURSE SIGNATURE__________________________________  ________________________________________________________________________

## 2015-08-06 ENCOUNTER — Encounter (HOSPITAL_COMMUNITY)
Admission: RE | Admit: 2015-08-06 | Discharge: 2015-08-06 | Disposition: A | Discharge status: Home / Self Care | Payer: PPO | Source: Ambulatory Visit | Attending: Surgery | Admitting: Surgery

## 2015-08-06 ENCOUNTER — Encounter (HOSPITAL_COMMUNITY): Payer: Self-pay

## 2015-08-06 DIAGNOSIS — Z01818 Encounter for other preprocedural examination: Secondary | ICD-10-CM | POA: Diagnosis not present

## 2015-08-06 DIAGNOSIS — I10 Essential (primary) hypertension: Secondary | ICD-10-CM | POA: Insufficient documentation

## 2015-08-06 DIAGNOSIS — K4020 Bilateral inguinal hernia, without obstruction or gangrene, not specified as recurrent: Secondary | ICD-10-CM | POA: Diagnosis not present

## 2015-08-06 HISTORY — DX: Anemia, unspecified: D64.9

## 2015-08-06 HISTORY — DX: Peripheral vascular disease, unspecified: I73.9

## 2015-08-06 NOTE — Progress Notes (Signed)
07/20/2015- noted in [facility] labs-CEA,CBC w/diff., CMET and CT chest w/contrast, CT abd.pelvis w/contrast. 10/27/2014-noted in [facility]-2D echo

## 2015-08-13 ENCOUNTER — Encounter (HOSPITAL_COMMUNITY)
Admission: RE | Disposition: A | Discharge status: Home / Self Care | Payer: Self-pay | Source: Ambulatory Visit | Attending: Surgery

## 2015-08-13 ENCOUNTER — Ambulatory Visit (HOSPITAL_COMMUNITY): Payer: PPO | Admitting: Certified Registered Nurse Anesthetist

## 2015-08-13 ENCOUNTER — Ambulatory Visit (HOSPITAL_COMMUNITY)
Admission: RE | Admit: 2015-08-13 | Discharge: 2015-08-13 | Disposition: A | Discharge status: Home / Self Care | Payer: PPO | Source: Ambulatory Visit | Attending: Surgery | Admitting: Surgery

## 2015-08-13 ENCOUNTER — Encounter (HOSPITAL_COMMUNITY): Payer: Self-pay | Admitting: *Deleted

## 2015-08-13 DIAGNOSIS — K660 Peritoneal adhesions (postprocedural) (postinfection): Secondary | ICD-10-CM | POA: Insufficient documentation

## 2015-08-13 DIAGNOSIS — K644 Residual hemorrhoidal skin tags: Secondary | ICD-10-CM | POA: Insufficient documentation

## 2015-08-13 DIAGNOSIS — Z980 Intestinal bypass and anastomosis status: Secondary | ICD-10-CM | POA: Diagnosis not present

## 2015-08-13 DIAGNOSIS — Z923 Personal history of irradiation: Secondary | ICD-10-CM | POA: Diagnosis not present

## 2015-08-13 DIAGNOSIS — R152 Fecal urgency: Secondary | ICD-10-CM | POA: Insufficient documentation

## 2015-08-13 DIAGNOSIS — K4090 Unilateral inguinal hernia, without obstruction or gangrene, not specified as recurrent: Secondary | ICD-10-CM

## 2015-08-13 DIAGNOSIS — K4020 Bilateral inguinal hernia, without obstruction or gangrene, not specified as recurrent: Secondary | ICD-10-CM | POA: Diagnosis not present

## 2015-08-13 DIAGNOSIS — Z9221 Personal history of antineoplastic chemotherapy: Secondary | ICD-10-CM | POA: Insufficient documentation

## 2015-08-13 DIAGNOSIS — I10 Essential (primary) hypertension: Secondary | ICD-10-CM | POA: Diagnosis not present

## 2015-08-13 DIAGNOSIS — Z85048 Personal history of other malignant neoplasm of rectum, rectosigmoid junction, and anus: Secondary | ICD-10-CM | POA: Insufficient documentation

## 2015-08-13 DIAGNOSIS — K4091 Unilateral inguinal hernia, without obstruction or gangrene, recurrent: Secondary | ICD-10-CM

## 2015-08-13 DIAGNOSIS — I739 Peripheral vascular disease, unspecified: Secondary | ICD-10-CM | POA: Diagnosis not present

## 2015-08-13 HISTORY — PX: INSERTION OF MESH: SHX5868

## 2015-08-13 HISTORY — PX: INGUINAL HERNIA REPAIR: SHX194

## 2015-08-13 SURGERY — REPAIR, HERNIA, INGUINAL, BILATERAL, LAPAROSCOPIC
Anesthesia: General | Laterality: Bilateral

## 2015-08-13 MED ORDER — DEXAMETHASONE SODIUM PHOSPHATE 10 MG/ML IJ SOLN
INTRAMUSCULAR | Status: DC | PRN
  Administered 2015-08-13: 10 mg via INTRAVENOUS

## 2015-08-13 MED ORDER — CEFAZOLIN SODIUM-DEXTROSE 2-3 GM-% IV SOLR
2.0000 g | INTRAVENOUS | Status: AC
  Administered 2015-08-13: 2 g via INTRAVENOUS

## 2015-08-13 MED ORDER — LIDOCAINE HCL (CARDIAC) 20 MG/ML IV SOLN
INTRAVENOUS | Status: DC | PRN
  Administered 2015-08-13: 60 mg via INTRAVENOUS

## 2015-08-13 MED ORDER — PHENYLEPHRINE 40 MCG/ML (10ML) SYRINGE FOR IV PUSH (FOR BLOOD PRESSURE SUPPORT)
PREFILLED_SYRINGE | INTRAVENOUS | Status: AC
  Filled 2015-08-13: qty 10

## 2015-08-13 MED ORDER — ROCURONIUM BROMIDE 100 MG/10ML IV SOLN
INTRAVENOUS | Status: AC
  Filled 2015-08-13: qty 1

## 2015-08-13 MED ORDER — SODIUM CHLORIDE 0.9 % IJ SOLN
INTRAMUSCULAR | Status: AC
  Filled 2015-08-13: qty 10

## 2015-08-13 MED ORDER — CHLORHEXIDINE GLUCONATE 4 % EX LIQD: 1.0000 "application " | Freq: Once | CUTANEOUS | Status: DC

## 2015-08-13 MED ORDER — FENTANYL CITRATE (PF) 250 MCG/5ML IJ SOLN
INTRAMUSCULAR | Status: AC
  Filled 2015-08-13: qty 5

## 2015-08-13 MED ORDER — FENTANYL CITRATE (PF) 100 MCG/2ML IJ SOLN
INTRAMUSCULAR | Status: AC
  Filled 2015-08-13: qty 2

## 2015-08-13 MED ORDER — EPHEDRINE SULFATE 50 MG/ML IJ SOLN
INTRAMUSCULAR | Status: AC
  Filled 2015-08-13: qty 1

## 2015-08-13 MED ORDER — LIDOCAINE HCL (CARDIAC) 20 MG/ML IV SOLN
INTRAVENOUS | Status: AC
  Filled 2015-08-13: qty 5

## 2015-08-13 MED ORDER — SUGAMMADEX SODIUM 200 MG/2ML IV SOLN
INTRAVENOUS | Status: DC | PRN
  Administered 2015-08-13: 250 mg via INTRAVENOUS

## 2015-08-13 MED ORDER — OXYCODONE HCL 5 MG PO TABS
5.0000 mg | ORAL_TABLET | Freq: Once | ORAL | Status: AC
  Administered 2015-08-13: 5 mg via ORAL
  Filled 2015-08-13: qty 1

## 2015-08-13 MED ORDER — FENTANYL CITRATE (PF) 100 MCG/2ML IJ SOLN
INTRAMUSCULAR | Status: DC | PRN
  Administered 2015-08-13: 50 ug via INTRAVENOUS

## 2015-08-13 MED ORDER — DEXAMETHASONE SODIUM PHOSPHATE 10 MG/ML IJ SOLN
INTRAMUSCULAR | Status: AC
  Filled 2015-08-13: qty 1

## 2015-08-13 MED ORDER — PHENYLEPHRINE HCL 10 MG/ML IJ SOLN
INTRAMUSCULAR | Status: DC | PRN
  Administered 2015-08-13 (×3): 80 ug via INTRAVENOUS
  Administered 2015-08-13 (×3): 40 ug via INTRAVENOUS
  Administered 2015-08-13: 80 ug via INTRAVENOUS

## 2015-08-13 MED ORDER — BUPIVACAINE-EPINEPHRINE 0.25% -1:200000 IJ SOLN
INTRAMUSCULAR | Status: DC | PRN
  Administered 2015-08-13: 80 mL

## 2015-08-13 MED ORDER — FENTANYL CITRATE (PF) 100 MCG/2ML IJ SOLN
25.0000 ug | INTRAMUSCULAR | Status: DC | PRN
Start: 2015-08-13 — End: 2015-08-13
  Administered 2015-08-13 (×2): 50 ug via INTRAVENOUS

## 2015-08-13 MED ORDER — MIDAZOLAM HCL 5 MG/5ML IJ SOLN
INTRAMUSCULAR | Status: DC | PRN
  Administered 2015-08-13: 2 mg via INTRAVENOUS

## 2015-08-13 MED ORDER — ALBUMIN HUMAN 5 % IV SOLN
25.0000 g | Freq: Once | INTRAVENOUS | Status: AC
  Administered 2015-08-13 (×2): via INTRAVENOUS
  Filled 2015-08-13: qty 500

## 2015-08-13 MED ORDER — PROPOFOL 10 MG/ML IV BOLUS
INTRAVENOUS | Status: DC | PRN
  Administered 2015-08-13: 140 mg via INTRAVENOUS

## 2015-08-13 MED ORDER — EPHEDRINE SULFATE 50 MG/ML IJ SOLN
INTRAMUSCULAR | Status: DC | PRN
  Administered 2015-08-13: 5 mg via INTRAVENOUS
  Administered 2015-08-13: 10 mg via INTRAVENOUS

## 2015-08-13 MED ORDER — CEFAZOLIN SODIUM-DEXTROSE 2-3 GM-% IV SOLR
INTRAVENOUS | Status: AC
  Filled 2015-08-13: qty 50

## 2015-08-13 MED ORDER — LACTATED RINGERS IV SOLN
INTRAVENOUS | Status: DC | PRN
  Administered 2015-08-13: 07:00:00 via INTRAVENOUS

## 2015-08-13 MED ORDER — OXYCODONE HCL 5 MG PO TABS: 5.0000 mg | ORAL_TABLET | Freq: Four times a day (QID) | ORAL | Status: DC | PRN

## 2015-08-13 MED ORDER — ONDANSETRON HCL 4 MG/2ML IJ SOLN
INTRAMUSCULAR | Status: DC | PRN
  Administered 2015-08-13: 4 mg via INTRAVENOUS

## 2015-08-13 MED ORDER — MIDAZOLAM HCL 2 MG/2ML IJ SOLN
INTRAMUSCULAR | Status: AC
  Filled 2015-08-13: qty 2

## 2015-08-13 MED ORDER — STERILE WATER FOR IRRIGATION IR SOLN
Status: DC | PRN
  Administered 2015-08-13: 1000 mL

## 2015-08-13 MED ORDER — KETOROLAC TROMETHAMINE 30 MG/ML IJ SOLN
INTRAMUSCULAR | Status: AC
  Filled 2015-08-13: qty 1

## 2015-08-13 MED ORDER — BUPIVACAINE-EPINEPHRINE (PF) 0.25% -1:200000 IJ SOLN
INTRAMUSCULAR | Status: AC
  Filled 2015-08-13: qty 30

## 2015-08-13 MED ORDER — SUCCINYLCHOLINE CHLORIDE 20 MG/ML IJ SOLN
INTRAMUSCULAR | Status: DC | PRN
  Administered 2015-08-13: 160 mg via INTRAVENOUS

## 2015-08-13 MED ORDER — ROCURONIUM BROMIDE 100 MG/10ML IV SOLN
INTRAVENOUS | Status: DC | PRN
  Administered 2015-08-13 (×2): 10 mg via INTRAVENOUS
  Administered 2015-08-13: 50 mg via INTRAVENOUS

## 2015-08-13 MED ORDER — PROPOFOL 10 MG/ML IV BOLUS
INTRAVENOUS | Status: AC
  Filled 2015-08-13: qty 20

## 2015-08-13 MED ORDER — ONDANSETRON HCL 4 MG/2ML IJ SOLN
INTRAMUSCULAR | Status: AC
  Filled 2015-08-13: qty 2

## 2015-08-13 MED ORDER — SUGAMMADEX SODIUM 500 MG/5ML IV SOLN
INTRAVENOUS | Status: AC
  Filled 2015-08-13: qty 5

## 2015-08-13 MED ORDER — KETOROLAC TROMETHAMINE 30 MG/ML IJ SOLN
INTRAMUSCULAR | Status: DC | PRN
  Administered 2015-08-13: 15 mg via INTRAVENOUS

## 2015-08-13 MED ORDER — BUPIVACAINE-EPINEPHRINE 0.25% -1:200000 IJ SOLN
INTRAMUSCULAR | Status: AC
  Filled 2015-08-13: qty 1

## 2015-08-13 SURGICAL SUPPLY — 34 items
CABLE HIGH FREQUENCY MONO STRZ (ELECTRODE) ×2 IMPLANT
CHLORAPREP W/TINT 26ML (MISCELLANEOUS) ×3 IMPLANT
COVER SURGICAL LIGHT HANDLE (MISCELLANEOUS) ×1 IMPLANT
DECANTER SPIKE VIAL GLASS SM (MISCELLANEOUS) ×3 IMPLANT
DEVICE SECURE STRAP 25 ABSORB (INSTRUMENTS) IMPLANT
DRAPE LAPAROSCOPIC ABDOMINAL (DRAPES) ×3 IMPLANT
DRAPE WARM FLUID 44X44 (DRAPE) ×3 IMPLANT
DRSG TEGADERM 2-3/8X2-3/4 SM (GAUZE/BANDAGES/DRESSINGS) ×4 IMPLANT
DRSG TEGADERM 4X4.75 (GAUZE/BANDAGES/DRESSINGS) ×3 IMPLANT
ELECT REM PT RETURN 9FT ADLT (ELECTROSURGICAL) ×3
ELECTRODE REM PT RTRN 9FT ADLT (ELECTROSURGICAL) ×1 IMPLANT
GAUZE SPONGE 2X2 8PLY STRL LF (GAUZE/BANDAGES/DRESSINGS) IMPLANT
GLOVE ECLIPSE 8.0 STRL XLNG CF (GLOVE) ×3 IMPLANT
GLOVE INDICATOR 8.0 STRL GRN (GLOVE) ×3 IMPLANT
GOWN STRL REUS W/TWL XL LVL3 (GOWN DISPOSABLE) ×8 IMPLANT
KIT BASIN OR (CUSTOM PROCEDURE TRAY) ×3 IMPLANT
MESH ULTRAPRO 6X6 15CM15CM (Mesh General) ×6 IMPLANT
PAD POSITIONING PINK XL (MISCELLANEOUS) ×3 IMPLANT
POSITIONER SURGICAL ARM (MISCELLANEOUS) ×2 IMPLANT
SCISSORS LAP 5X35 DISP (ENDOMECHANICALS) ×3 IMPLANT
SET IRRIG TUBING LAPAROSCOPIC (IRRIGATION / IRRIGATOR) ×2 IMPLANT
SLEEVE XCEL OPT CAN 5 100 (ENDOMECHANICALS) ×2 IMPLANT
SPONGE GAUZE 2X2 STER 10/PKG (GAUZE/BANDAGES/DRESSINGS) ×2
SUT MNCRL AB 4-0 PS2 18 (SUTURE) ×3 IMPLANT
SUT VIC AB 3-0 SH 27 (SUTURE) ×6
SUT VIC AB 3-0 SH 27XBRD (SUTURE) IMPLANT
TACKER 5MM HERNIA 3.5CML NAB (ENDOMECHANICALS) IMPLANT
TAPE CLOTH 4X10 WHT NS (GAUZE/BANDAGES/DRESSINGS) ×2 IMPLANT
TOWEL OR 17X26 10 PK STRL BLUE (TOWEL DISPOSABLE) ×3 IMPLANT
TRAY LAPAROSCOPIC (CUSTOM PROCEDURE TRAY) ×3 IMPLANT
TROCAR BLADELESS OPT 5 100 (ENDOMECHANICALS) ×2 IMPLANT
TROCAR CANNULA W/PORT DUAL 5MM (MISCELLANEOUS) IMPLANT
TROCAR XCEL BLUNT TIP 100MML (ENDOMECHANICALS) ×3 IMPLANT
TUBING INSUF HEATED (TUBING) ×3 IMPLANT

## 2015-08-13 NOTE — Anesthesia Procedure Notes (Signed)
Procedure Name: Intubation Date/Time: 08/13/2015 7:35 AM Performed by: Gaston Islam EVETTE Pre-anesthesia Checklist: Patient identified, Emergency Drugs available, Suction available and Patient being monitored Patient Re-evaluated:Patient Re-evaluated prior to inductionOxygen Delivery Method: Circle system utilized Preoxygenation: Pre-oxygenation with 100% oxygen Intubation Type: IV induction Ventilation: Mask ventilation without difficulty Laryngoscope Size: Mac and 4 Grade View: Grade I Tube type: Oral Tube size: 7.5 mm Number of attempts: 1 Airway Equipment and Method: Patient positioned with wedge pillow and Stylet Placement Confirmation: ETT inserted through vocal cords under direct vision,  positive ETCO2,  CO2 detector and breath sounds checked- equal and bilateral Secured at: 22 cm Tube secured with: Tape Dental Injury: Teeth and Oropharynx as per pre-operative assessment

## 2015-08-13 NOTE — H&P (Signed)
Tyler Diaz 06/29/2015 11:12 AM Location: [facility] Patient #: [id number] DOB: 09/20/1945 Married / Language: English / Race: White Male   History of Present Illness   The patient is a 69 year old male who presents with colorectal cancer. Tyler Diaz returns today feeling relatively well. He status post neoadjuvant chemoradiation therapy and resection of very low rectal cancer with coloanal anastomosis July 2014. Did have some anal stricturing but eventually improved with dilation and pelvic floor therapy.  He's feeling okay. Still working in the yard. Still has some fecal urgency but no incontinence anymore. Wearing Depends when he is out "just in case." Moves his bowels usually once or twice a day. Some urgency. But much improved. Occasionally gets irritation with bowel movements. He uses Anusol intermittently. Last time his insurance company refused to refill at the. He wondered if I could do at form. No fevers or chills. Activity good. Appetite great. No evidence of recurrence by CT scan February 2016 or evaluation by medical oncology.  Noticed a lump in his left groin concerning for a hernia - wished to be seen         Tyler Diaz  01/04/1946 [id number]  Patient Care Team: Donald W Moore, MD as PCP - General (Family Medicine)  C. , MD as Consulting Physician (Colon and Rectal Surgery) Vincent C. Schooler, MD as Consulting Physician (Gastroenterology) James Palermo, MD as Consulting Physician (Radiation Oncology) Gina B Dixon, RN as Registered Nurse (Oncology) Gary B Sherrill, MD as Consulting Physician (Oncology) Cynthia Snider, MD as Consulting Physician (Infectious Diseases)  Procedure (Date: 06/28/2013):  POST-OPERATIVE DIAGNOSIS: Rectal cancer status post low anterior resection, coloanal anastomosis, diverting loop ileostomy.  Stricture of coloanal anastomosis.  PROCEDURE: Procedure(s):  loop ILEOSTOMY TAKEDOWN  Dilation of  coloanal anastomosis  Examination of anesthesia  SURGEON: Surgeon(s):   C. , MD  Benjamin T Hoxworth, MD - Asst  Diagnosis 1. Rectum, biopsy, distal margin - BENIGN FIBROVASCULAR SOFT TISSUE. - NO TUMOR SEEN. 2. Colon, segmental resection for tumor, sigmoid - INVASIVE WELL DIFFERENTIATED ADENOCARCINOMA WITH ABUNDANT ACELLULAR MUCIN, SPANNING 4.5 CM IN GREATEST DIMENSION. - TUMOR INVADES THROUGH MUSCULARIS PROPRIA INTO PERICOLORECTAL TISSUES WITH ACELLULAR MUCIN CLOSELY APPROACHING SEROSAL SURFACE. - DISTAL MARGIN OF RESECTION SPECIMEN SHOWS INVOLVEMENT WITH ACELLULAR MUCIN (SEE FIRST SPECIMEN FOR FINAL MARGIN STATUS). - OTHER MARGINS NEGATIVE. 1 of 4 Duplicate copy FINAL for Tyler Diaz, Tyler Diaz ([id number]) Diagnosis(continued) - THREE LYMPH NODES POSITIVE FOR METASTATIC CARCINOMA (3/3). - FOUR ADDITIONAL LYMPH NODES WITH ACELLULAR MUCIN BUT NO RESIDUAL VIABLE TUMOR (0/4). - NINE ADDITIONAL LYMPH NODES WITH NO TUMOR SEEN (0/9). - TWO SOFT TISSUE TUMOR DEPOSITS SHOWING VIABLE TUMOR. - TEN ADDITIONAL SOFT TISSUE DEPOSITS OF ACELLULAR MUCIN. - SEE ONCOLOGY TEMPLATE. Microscopic Comment 2. COLON AND RECTUM Specimen: Sigmoid and rectal colon. Procedure: Segmental resection of colon. Tumor site: Distal third of the rectum. Specimen integrity: Intact. Macroscopic intactness of mesorectum: Incomplete. Macroscopic tumor perforation: No. Invasive tumor: Maximum size: 4.5 cm. Histologic type(s): Adenocarcinoma with abundant mucin, see below comments. Histologic grade and differentiation: G1: well differentiated/low grade. Type of polyp in which invasive carcinoma arose: Precursor polyp not identified. Microscopic extension of invasive tumor: Tumor invades through muscularis propria into pericolorectal tissues. Lymph-Vascular invasion: Definitive lymphovascular invasion is not identified; however, several lymph nodes are positive for metastatic carcinoma, see  below. Peri-neural invasion: Not identified. Tumor deposit(s) (discontinuous extramural extension): Yes, two tumor deposits with viable tumor. Resection margins: Proximal margin: Negative. Distal margin: Negative (see specimen #2). Circumferential (  Tyler Diaz 06/29/2015 11:12 AM Location: [facility] Patient #: [id number] DOB: 09/20/1945 Married / Language: English / Race: White Male   History of Present Illness   The patient is a 69 year old male who presents with colorectal cancer. Tyler Diaz returns today feeling relatively well. He status post neoadjuvant chemoradiation therapy and resection of very low rectal cancer with coloanal anastomosis July 2014. Did have some anal stricturing but eventually improved with dilation and pelvic floor therapy.  He's feeling okay. Still working in the yard. Still has some fecal urgency but no incontinence anymore. Wearing Depends when he is out "just in case." Moves his bowels usually once or twice a day. Some urgency. But much improved. Occasionally gets irritation with bowel movements. He uses Anusol intermittently. Last time his insurance company refused to refill at the. He wondered if I could do at form. No fevers or chills. Activity good. Appetite great. No evidence of recurrence by CT scan February 2016 or evaluation by medical oncology.  Noticed a lump in his left groin concerning for a hernia - wished to be seen         Tyler Diaz  01/04/1946 [id number]  Patient Care Team: Donald W Moore, MD as PCP - General (Family Medicine) Deiondre Harrower C. Travone Georg, MD as Consulting Physician (Colon and Rectal Surgery) Vincent C. Schooler, MD as Consulting Physician (Gastroenterology) James Palermo, MD as Consulting Physician (Radiation Oncology) Gina B Dixon, RN as Registered Nurse (Oncology) Gary B Sherrill, MD as Consulting Physician (Oncology) Cynthia Snider, MD as Consulting Physician (Infectious Diseases)  Procedure (Date: 06/28/2013):  POST-OPERATIVE DIAGNOSIS: Rectal cancer status post low anterior resection, coloanal anastomosis, diverting loop ileostomy.  Stricture of coloanal anastomosis.  PROCEDURE: Procedure(s):  loop ILEOSTOMY TAKEDOWN  Dilation of  coloanal anastomosis  Examination of anesthesia  SURGEON: Surgeon(s):  Manjot Hinks C. Eloy Fehl, MD  Benjamin T Hoxworth, MD - Asst  Diagnosis 1. Rectum, biopsy, distal margin - BENIGN FIBROVASCULAR SOFT TISSUE. - NO TUMOR SEEN. 2. Colon, segmental resection for tumor, sigmoid - INVASIVE WELL DIFFERENTIATED ADENOCARCINOMA WITH ABUNDANT ACELLULAR MUCIN, SPANNING 4.5 CM IN GREATEST DIMENSION. - TUMOR INVADES THROUGH MUSCULARIS PROPRIA INTO PERICOLORECTAL TISSUES WITH ACELLULAR MUCIN CLOSELY APPROACHING SEROSAL SURFACE. - DISTAL MARGIN OF RESECTION SPECIMEN SHOWS INVOLVEMENT WITH ACELLULAR MUCIN (SEE FIRST SPECIMEN FOR FINAL MARGIN STATUS). - OTHER MARGINS NEGATIVE. 1 of 4 Duplicate copy FINAL for Tyler Diaz, Tyler Diaz ([id number]) Diagnosis(continued) - THREE LYMPH NODES POSITIVE FOR METASTATIC CARCINOMA (3/3). - FOUR ADDITIONAL LYMPH NODES WITH ACELLULAR MUCIN BUT NO RESIDUAL VIABLE TUMOR (0/4). - NINE ADDITIONAL LYMPH NODES WITH NO TUMOR SEEN (0/9). - TWO SOFT TISSUE TUMOR DEPOSITS SHOWING VIABLE TUMOR. - TEN ADDITIONAL SOFT TISSUE DEPOSITS OF ACELLULAR MUCIN. - SEE ONCOLOGY TEMPLATE. Microscopic Comment 2. COLON AND RECTUM Specimen: Sigmoid and rectal colon. Procedure: Segmental resection of colon. Tumor site: Distal third of the rectum. Specimen integrity: Intact. Macroscopic intactness of mesorectum: Incomplete. Macroscopic tumor perforation: No. Invasive tumor: Maximum size: 4.5 cm. Histologic type(s): Adenocarcinoma with abundant mucin, see below comments. Histologic grade and differentiation: G1: well differentiated/low grade. Type of polyp in which invasive carcinoma arose: Precursor polyp not identified. Microscopic extension of invasive tumor: Tumor invades through muscularis propria into pericolorectal tissues. Lymph-Vascular invasion: Definitive lymphovascular invasion is not identified; however, several lymph nodes are positive for metastatic carcinoma, see  below. Peri-neural invasion: Not identified. Tumor deposit(s) (discontinuous extramural extension): Yes, two tumor deposits with viable tumor. Resection margins: Proximal margin: Negative. Distal margin: Negative (see specimen #2). Circumferential (  Tyler Diaz 06/29/2015 11:12 AM Location: [facility] Patient #: [id number] DOB: 09/20/1945 Married / Language: English / Race: White Male   History of Present Illness   The patient is a 69 year old male who presents with colorectal cancer. Tyler Diaz returns today feeling relatively well. He status post neoadjuvant chemoradiation therapy and resection of very low rectal cancer with coloanal anastomosis July 2014. Did have some anal stricturing but eventually improved with dilation and pelvic floor therapy.  He's feeling okay. Still working in the yard. Still has some fecal urgency but no incontinence anymore. Wearing Depends when he is out "just in case." Moves his bowels usually once or twice a day. Some urgency. But much improved. Occasionally gets irritation with bowel movements. He uses Anusol intermittently. Last time his insurance company refused to refill at the. He wondered if I could do at form. No fevers or chills. Activity good. Appetite great. No evidence of recurrence by CT scan February 2016 or evaluation by medical oncology.  Noticed a lump in his left groin concerning for a hernia - wished to be seen         Tyler Diaz  01/04/1946 [id number]  Patient Care Team: Donald W Moore, MD as PCP - General (Family Medicine)  C. , MD as Consulting Physician (Colon and Rectal Surgery) Vincent C. Schooler, MD as Consulting Physician (Gastroenterology) James Palermo, MD as Consulting Physician (Radiation Oncology) Gina B Dixon, RN as Registered Nurse (Oncology) Gary B Sherrill, MD as Consulting Physician (Oncology) Cynthia Snider, MD as Consulting Physician (Infectious Diseases)  Procedure (Date: 06/28/2013):  POST-OPERATIVE DIAGNOSIS: Rectal cancer status post low anterior resection, coloanal anastomosis, diverting loop ileostomy.  Stricture of coloanal anastomosis.  PROCEDURE: Procedure(s):  loop ILEOSTOMY TAKEDOWN  Dilation of  coloanal anastomosis  Examination of anesthesia  SURGEON: Surgeon(s):   C. , MD  Benjamin T Hoxworth, MD - Asst  Diagnosis 1. Rectum, biopsy, distal margin - BENIGN FIBROVASCULAR SOFT TISSUE. - NO TUMOR SEEN. 2. Colon, segmental resection for tumor, sigmoid - INVASIVE WELL DIFFERENTIATED ADENOCARCINOMA WITH ABUNDANT ACELLULAR MUCIN, SPANNING 4.5 CM IN GREATEST DIMENSION. - TUMOR INVADES THROUGH MUSCULARIS PROPRIA INTO PERICOLORECTAL TISSUES WITH ACELLULAR MUCIN CLOSELY APPROACHING SEROSAL SURFACE. - DISTAL MARGIN OF RESECTION SPECIMEN SHOWS INVOLVEMENT WITH ACELLULAR MUCIN (SEE FIRST SPECIMEN FOR FINAL MARGIN STATUS). - OTHER MARGINS NEGATIVE. 1 of 4 Duplicate copy FINAL for Tyler Diaz, Tyler Diaz ([id number]) Diagnosis(continued) - THREE LYMPH NODES POSITIVE FOR METASTATIC CARCINOMA (3/3). - FOUR ADDITIONAL LYMPH NODES WITH ACELLULAR MUCIN BUT NO RESIDUAL VIABLE TUMOR (0/4). - NINE ADDITIONAL LYMPH NODES WITH NO TUMOR SEEN (0/9). - TWO SOFT TISSUE TUMOR DEPOSITS SHOWING VIABLE TUMOR. - TEN ADDITIONAL SOFT TISSUE DEPOSITS OF ACELLULAR MUCIN. - SEE ONCOLOGY TEMPLATE. Microscopic Comment 2. COLON AND RECTUM Specimen: Sigmoid and rectal colon. Procedure: Segmental resection of colon. Tumor site: Distal third of the rectum. Specimen integrity: Intact. Macroscopic intactness of mesorectum: Incomplete. Macroscopic tumor perforation: No. Invasive tumor: Maximum size: 4.5 cm. Histologic type(s): Adenocarcinoma with abundant mucin, see below comments. Histologic grade and differentiation: G1: well differentiated/low grade. Type of polyp in which invasive carcinoma arose: Precursor polyp not identified. Microscopic extension of invasive tumor: Tumor invades through muscularis propria into pericolorectal tissues. Lymph-Vascular invasion: Definitive lymphovascular invasion is not identified; however, several lymph nodes are positive for metastatic carcinoma, see  below. Peri-neural invasion: Not identified. Tumor deposit(s) (discontinuous extramural extension): Yes, two tumor deposits with viable tumor. Resection margins: Proximal margin: Negative. Distal margin: Negative (see specimen #2). Circumferential (  Tyler Diaz 06/29/2015 11:12 AM Location: [facility] Patient #: [id number] DOB: 09/20/1945 Married / Language: English / Race: White Male   History of Present Illness   The patient is a 69 year old male who presents with colorectal cancer. Tyler Diaz returns today feeling relatively well. He status post neoadjuvant chemoradiation therapy and resection of very low rectal cancer with coloanal anastomosis July 2014. Did have some anal stricturing but eventually improved with dilation and pelvic floor therapy.  He's feeling okay. Still working in the yard. Still has some fecal urgency but no incontinence anymore. Wearing Depends when he is out "just in case." Moves his bowels usually once or twice a day. Some urgency. But much improved. Occasionally gets irritation with bowel movements. He uses Anusol intermittently. Last time his insurance company refused to refill at the. He wondered if I could do at form. No fevers or chills. Activity good. Appetite great. No evidence of recurrence by CT scan February 2016 or evaluation by medical oncology.  Noticed a lump in his left groin concerning for a hernia - wished to be seen         Tyler Diaz  01/04/1946 [id number]  Patient Care Team: Donald W Moore, MD as PCP - General (Family Medicine) Daire Okimoto C. Maurisio Ruddy, MD as Consulting Physician (Colon and Rectal Surgery) Vincent C. Schooler, MD as Consulting Physician (Gastroenterology) James Palermo, MD as Consulting Physician (Radiation Oncology) Gina B Dixon, RN as Registered Nurse (Oncology) Gary B Sherrill, MD as Consulting Physician (Oncology) Cynthia Snider, MD as Consulting Physician (Infectious Diseases)  Procedure (Date: 06/28/2013):  POST-OPERATIVE DIAGNOSIS: Rectal cancer status post low anterior resection, coloanal anastomosis, diverting loop ileostomy.  Stricture of coloanal anastomosis.  PROCEDURE: Procedure(s):  loop ILEOSTOMY TAKEDOWN  Dilation of  coloanal anastomosis  Examination of anesthesia  SURGEON: Surgeon(s):  Dashanti Burr C. Billye Pickerel, MD  Benjamin T Hoxworth, MD - Asst  Diagnosis 1. Rectum, biopsy, distal margin - BENIGN FIBROVASCULAR SOFT TISSUE. - NO TUMOR SEEN. 2. Colon, segmental resection for tumor, sigmoid - INVASIVE WELL DIFFERENTIATED ADENOCARCINOMA WITH ABUNDANT ACELLULAR MUCIN, SPANNING 4.5 CM IN GREATEST DIMENSION. - TUMOR INVADES THROUGH MUSCULARIS PROPRIA INTO PERICOLORECTAL TISSUES WITH ACELLULAR MUCIN CLOSELY APPROACHING SEROSAL SURFACE. - DISTAL MARGIN OF RESECTION SPECIMEN SHOWS INVOLVEMENT WITH ACELLULAR MUCIN (SEE FIRST SPECIMEN FOR FINAL MARGIN STATUS). - OTHER MARGINS NEGATIVE. 1 of 4 Duplicate copy FINAL for Tyler Diaz, Tyler Diaz ([id number]) Diagnosis(continued) - THREE LYMPH NODES POSITIVE FOR METASTATIC CARCINOMA (3/3). - FOUR ADDITIONAL LYMPH NODES WITH ACELLULAR MUCIN BUT NO RESIDUAL VIABLE TUMOR (0/4). - NINE ADDITIONAL LYMPH NODES WITH NO TUMOR SEEN (0/9). - TWO SOFT TISSUE TUMOR DEPOSITS SHOWING VIABLE TUMOR. - TEN ADDITIONAL SOFT TISSUE DEPOSITS OF ACELLULAR MUCIN. - SEE ONCOLOGY TEMPLATE. Microscopic Comment 2. COLON AND RECTUM Specimen: Sigmoid and rectal colon. Procedure: Segmental resection of colon. Tumor site: Distal third of the rectum. Specimen integrity: Intact. Macroscopic intactness of mesorectum: Incomplete. Macroscopic tumor perforation: No. Invasive tumor: Maximum size: 4.5 cm. Histologic type(s): Adenocarcinoma with abundant mucin, see below comments. Histologic grade and differentiation: G1: well differentiated/low grade. Type of polyp in which invasive carcinoma arose: Precursor polyp not identified. Microscopic extension of invasive tumor: Tumor invades through muscularis propria into pericolorectal tissues. Lymph-Vascular invasion: Definitive lymphovascular invasion is not identified; however, several lymph nodes are positive for metastatic carcinoma, see  below. Peri-neural invasion: Not identified. Tumor deposit(s) (discontinuous extramural extension): Yes, two tumor deposits with viable tumor. Resection margins: Proximal margin: Negative. Distal margin: Negative (see specimen #2). Circumferential (  Tyler Diaz 06/29/2015 11:12 AM Location: [facility] Patient #: [id number] DOB: 03/06/1946 Married / Language: English / Race: White Male   History of Present Illness   The patient is a 70 year old male who presents with colorectal cancer. Tyler Ambrosini returns today feeling relatively well. He status post neoadjuvant chemoradiation therapy and resection of very low rectal cancer with coloanal anastomosis July 2014. Did have some anal stricturing but eventually improved with dilation and pelvic floor therapy.  He's feeling okay. Still working in the yard. Still has some fecal urgency but no incontinence anymore. Wearing Depends when he is out "just in case." Moves his bowels usually once or twice a day. Some urgency. But much improved. Occasionally gets irritation with bowel movements. He uses Anusol intermittently. Last time his insurance company refused to refill at the. He wondered if I could do at form. No fevers or chills. Activity good. Appetite great. No evidence of recurrence by CT scan February 2016 or evaluation by medical oncology.  Noticed a lump in his left groin concerning for a hernia - wished to be seen         Tyler Diaz  [birth year] [id number]  Patient Care Team: Chipper Herb, MD as PCP - General (Family Medicine) Adin Hector, MD as Consulting Physician (Colon and Rectal Surgery) Lear Ng, MD as Consulting Physician (Gastroenterology) Gatha Mayer, MD as Consulting Physician (Radiation Oncology) Carola Frost, RN as Registered Nurse (Oncology) [name], MD as Consulting Physician (Oncology) Carlyle Basques, MD as Consulting Physician (Infectious Diseases)  Procedure (Date: 06/28/2013):  POST-OPERATIVE DIAGNOSIS: Rectal cancer status post low anterior resection, coloanal anastomosis, diverting loop ileostomy.  Stricture of coloanal anastomosis.  PROCEDURE: Procedure(s):  loop ILEOSTOMY TAKEDOWN  Dilation of  coloanal anastomosis  Examination of anesthesia  SURGEON: Surgeon(s):  Adin Hector, MD  Edward Jolly, MD - Asst  Diagnosis 1. Rectum, biopsy, distal margin - BENIGN FIBROVASCULAR SOFT TISSUE. - NO TUMOR SEEN. 2. Colon, segmental resection for tumor, sigmoid - INVASIVE WELL DIFFERENTIATED ADENOCARCINOMA WITH ABUNDANT ACELLULAR MUCIN, SPANNING 4.5 CM IN GREATEST DIMENSION. - TUMOR INVADES THROUGH MUSCULARIS PROPRIA INTO PERICOLORECTAL TISSUES WITH ACELLULAR MUCIN CLOSELY APPROACHING SEROSAL SURFACE. - DISTAL MARGIN OF RESECTION SPECIMEN SHOWS INVOLVEMENT WITH ACELLULAR MUCIN (SEE FIRST SPECIMEN FOR FINAL MARGIN STATUS). - OTHER MARGINS NEGATIVE. 1 of 4 Duplicate copy FINAL for Tyler Diaz, Tyler Diaz [phone]) Diagnosis(continued) - THREE LYMPH NODES POSITIVE FOR METASTATIC CARCINOMA (3/3). - FOUR ADDITIONAL LYMPH NODES WITH ACELLULAR MUCIN BUT NO RESIDUAL VIABLE TUMOR (0/4). - NINE ADDITIONAL LYMPH NODES WITH NO TUMOR SEEN (0/9). - TWO SOFT TISSUE TUMOR DEPOSITS SHOWING VIABLE TUMOR. - TEN ADDITIONAL SOFT TISSUE DEPOSITS OF ACELLULAR MUCIN. - SEE ONCOLOGY TEMPLATE. Microscopic Comment 2. COLON AND RECTUM Specimen: Sigmoid and rectal colon. Procedure: Segmental resection of colon. Tumor site: Distal third of the rectum. Specimen integrity: Intact. Macroscopic intactness of mesorectum: Incomplete. Macroscopic tumor perforation: No. Invasive tumor: Maximum size: 4.5 cm. Histologic type(s): Adenocarcinoma with abundant mucin, see below comments. Histologic grade and differentiation: G1: well differentiated/low grade. Type of polyp in which invasive carcinoma arose: Precursor polyp not identified. Microscopic extension of invasive tumor: Tumor invades through muscularis propria into pericolorectal tissues. Lymph-Vascular invasion: Definitive lymphovascular invasion is not identified; however, several lymph nodes are positive for metastatic carcinoma, see  below. Peri-neural invasion: Not identified. Tumor deposit(s) (discontinuous extramural extension): Yes, two tumor deposits with viable tumor. Resection margins: Proximal margin: Negative. Distal margin: Negative (see specimen #2). Circumferential (  Tyler Diaz 06/29/2015 11:12 AM Location: [facility] Patient #: [id number] DOB: 09/20/1945 Married / Language: English / Race: White Male   History of Present Illness   The patient is a 69 year old male who presents with colorectal cancer. Tyler Diaz returns today feeling relatively well. He status post neoadjuvant chemoradiation therapy and resection of very low rectal cancer with coloanal anastomosis July 2014. Did have some anal stricturing but eventually improved with dilation and pelvic floor therapy.  He's feeling okay. Still working in the yard. Still has some fecal urgency but no incontinence anymore. Wearing Depends when he is out "just in case." Moves his bowels usually once or twice a day. Some urgency. But much improved. Occasionally gets irritation with bowel movements. He uses Anusol intermittently. Last time his insurance company refused to refill at the. He wondered if I could do at form. No fevers or chills. Activity good. Appetite great. No evidence of recurrence by CT scan February 2016 or evaluation by medical oncology.  Noticed a lump in his left groin concerning for a hernia - wished to be seen         Tyler Diaz  01/04/1946 [id number]  Patient Care Team: Donald W Moore, MD as PCP - General (Family Medicine) Steven C. Gross, MD as Consulting Physician (Colon and Rectal Surgery) Vincent C. Schooler, MD as Consulting Physician (Gastroenterology) James Palermo, MD as Consulting Physician (Radiation Oncology) Gina B Dixon, RN as Registered Nurse (Oncology) Gary B Sherrill, MD as Consulting Physician (Oncology) Cynthia Snider, MD as Consulting Physician (Infectious Diseases)  Procedure (Date: 06/28/2013):  POST-OPERATIVE DIAGNOSIS: Rectal cancer status post low anterior resection, coloanal anastomosis, diverting loop ileostomy.  Stricture of coloanal anastomosis.  PROCEDURE: Procedure(s):  loop ILEOSTOMY TAKEDOWN  Dilation of  coloanal anastomosis  Examination of anesthesia  SURGEON: Surgeon(s):  Steven C. Gross, MD  Benjamin T Hoxworth, MD - Asst  Diagnosis 1. Rectum, biopsy, distal margin - BENIGN FIBROVASCULAR SOFT TISSUE. - NO TUMOR SEEN. 2. Colon, segmental resection for tumor, sigmoid - INVASIVE WELL DIFFERENTIATED ADENOCARCINOMA WITH ABUNDANT ACELLULAR MUCIN, SPANNING 4.5 CM IN GREATEST DIMENSION. - TUMOR INVADES THROUGH MUSCULARIS PROPRIA INTO PERICOLORECTAL TISSUES WITH ACELLULAR MUCIN CLOSELY APPROACHING SEROSAL SURFACE. - DISTAL MARGIN OF RESECTION SPECIMEN SHOWS INVOLVEMENT WITH ACELLULAR MUCIN (SEE FIRST SPECIMEN FOR FINAL MARGIN STATUS). - OTHER MARGINS NEGATIVE. 1 of 4 Duplicate copy FINAL for Tyler Diaz, Tyler Diaz ([id number]) Diagnosis(continued) - THREE LYMPH NODES POSITIVE FOR METASTATIC CARCINOMA (3/3). - FOUR ADDITIONAL LYMPH NODES WITH ACELLULAR MUCIN BUT NO RESIDUAL VIABLE TUMOR (0/4). - NINE ADDITIONAL LYMPH NODES WITH NO TUMOR SEEN (0/9). - TWO SOFT TISSUE TUMOR DEPOSITS SHOWING VIABLE TUMOR. - TEN ADDITIONAL SOFT TISSUE DEPOSITS OF ACELLULAR MUCIN. - SEE ONCOLOGY TEMPLATE. Microscopic Comment 2. COLON AND RECTUM Specimen: Sigmoid and rectal colon. Procedure: Segmental resection of colon. Tumor site: Distal third of the rectum. Specimen integrity: Intact. Macroscopic intactness of mesorectum: Incomplete. Macroscopic tumor perforation: No. Invasive tumor: Maximum size: 4.5 cm. Histologic type(s): Adenocarcinoma with abundant mucin, see below comments. Histologic grade and differentiation: G1: well differentiated/low grade. Type of polyp in which invasive carcinoma arose: Precursor polyp not identified. Microscopic extension of invasive tumor: Tumor invades through muscularis propria into pericolorectal tissues. Lymph-Vascular invasion: Definitive lymphovascular invasion is not identified; however, several lymph nodes are positive for metastatic carcinoma, see  below. Peri-neural invasion: Not identified. Tumor deposit(s) (discontinuous extramural extension): Yes, two tumor deposits with viable tumor. Resection margins: Proximal margin: Negative. Distal margin: Negative (see specimen #2). Circumferential (

## 2015-08-13 NOTE — Op Note (Signed)
08/13/2015  9:49 AM  PATIENT:  Tyler Diaz  70 y.o. male  Patient Care Team: Claretta Fraise, MD as PCP - General (Family Medicine) Michael Boston, MD as Consulting Physician (Colon and Rectal Surgery) Wilford Corner, MD as Consulting Physician (Gastroenterology) Gatha Mayer, MD as Consulting Physician (Radiation Oncology) Carola Frost, RN as Registered Nurse (Oncology) [name], MD as Consulting Physician (Oncology) Carlyle Basques, MD as Consulting Physician (Infectious Diseases)  PRE-OPERATIVE DIAGNOSIS:  Bilateral inguinal hernia   POST-OPERATIVE DIAGNOSIS:   Recurrent left inguinal hernia Right inguinal hernia  PROCEDURE:   Laparoscopic lysis of adhesions 60 minutes. LAPAROSCOPIC BILATERAL INGUINAL HERNIA REPAIR WITH MESH INSERTION OF MESH  SURGEON:  Surgeon(s): Michael Boston, MD  ASSISTANT: RN   ANESTHESIA:   Regional ilioinguinal and genitofemoral and spermatic cord nerve blocks with GETA  EBL:  Total I/O In: 1500 [I.V.:1000; IV Piggyback:500] Out: -   Delay start of Pharmacological VTE agent (>24hrs) due to surgical blood loss or risk of bleeding:  no  DRAINS: NONE  SPECIMEN:  NONE  DISPOSITION OF SPECIMEN:  N/A  COUNTS:  YES  PLAN OF CARE: Discharge to home after PACU  PATIENT DISPOSITION:  PACU - hemodynamically stable.  INDICATION: Patient with recurrent left inguinal hernia with pain and discomfort.  Probable right hernia as well.  I offered exploration and repair.  The anatomy & physiology of the abdominal wall and pelvic floor was discussed.  The pathophysiology of hernias in the inguinal and pelvic region was discussed.  Natural history risks such as progressive enlargement, pain, incarceration & strangulation was discussed.   Contributors to complications such as smoking, obesity, diabetes, prior surgery, etc were discussed.    I feel the risks of no intervention will lead to serious problems that outweigh the operative risks; therefore, I  recommended surgery to reduce and repair the hernia.  I explained laparoscopic techniques with possible need for an open approach.  I noted usual use of mesh to patch and/or buttress hernia repair  Risks such as bleeding, infection, abscess, need for further treatment, heart attack, death, and other risks were discussed.  I noted a good likelihood this will help address the problem.   Goals of post-operative recovery were discussed as well.  Possibility that this will not correct all symptoms was explained.  I stressed the importance of low-impact activity, aggressive pain control, avoiding constipation, & not pushing through pain to minimize risk of post-operative chronic pain or injury. Possibility of reherniation was discussed.  We will work to minimize complications.     An educational handout further explaining the pathology & treatment options was given as well.  Questions were answered.  The patient expresses understanding & wishes to proceed with surgery.  OR FINDINGS: Bilateral indirect inguinal hernias.  Left greater than right.  Chronic sac.  Scarring and old mesh and preperitoneal space medially on the right side.  DESCRIPTION:   The patient was identified & brought into the operating room. The patient was positioned supine with arms tucked. SCDs were active during the entire case. The patient underwent general anesthesia without any difficulty.  The abdomen was prepped and draped in a sterile fashion. The patient's bladder was emptied.  A Surgical Timeout confirmed our plan.  I made a transverse incision through the inferior umbilical fold.  I made a small transverse nick through the anterior rectus fascia contralateral to the inguinal hernia side and placed a 0-vicryl stitch through the fascia.  I placed a Hasson trocar into  the preperitoneal plane.  Entry was clean.  We induced carbon dioxide insufflation. Camera inspection revealed no injury.  I used a 60mm angled scope to bluntly free  the peritoneum off the infraumbilical anterior abdominal wall.  I created enough of a preperitoneal pocket to place 68mm ports into the right & left mid-abdomen into this preperitoneal cavity.  I focused attention on the LEFT side since that was the dominant hernia side.   I used blunt & focused sharp dissection to free the peritoneum off the flank and down to the pubic rim.  I did do sharp dissection with cautery since there were dense adhesions to the left rectus consistent with mesh around the direct space.  There was an obvious recurrent hernia going laterally through the internal ring.  I freed the anteriolateral bladder wall off the anteriolateral pelvic wall, sparing midline attachments.   I located a swath of peritoneum going into a hernia fascial defect at the internal ring consistent with an indirect inguinal hernia.  I gradually freed the peritoneal hernia sac off safely and reduced it into the preperitoneal space.  I freed the peritoneum off the spermatic vessels & vas deferens.  I freed peritoneum off the retroperitoneum along the psoas muscle.   Because of the sharp dissection needed to go through the old mesh and prior adhesions, I did close peritoneal defect as well as a high ligation of the hernia sac after trimming some excess tissue and removing it.  Use 3-0 Vicryl intracorporeal suturing.   I turned attention on the opposite side.  I did dissection in a similar, mirror-image fashion. The patient had a smaller indirect inguinal hernia.  Plans were watched more normal.  I did high ligation of the hernia sac on the right side as well using laparoscopic intracorporeal suturing with absorbable suture.  I chose 15x15 cm sheets of ultra-lightweight polypropylene mesh (Ultrapro), one for each side.  I cut a single sigmoid-shaped slit ~6cm from a corner of each mesh.  I placed the meshes into the preperitoneal space & laid them as overlapping diamonds such that at the inferior points, a 6x6 cm corner  flap rested in the true anterolateral pelvis, covering the obturator & femoral foramina.   I allowed the bladder to return to the pubis, this helping tuck the corners of the mesh in the anteriolateral pelvis.  The medial corners overlapped each other across midline cephalad to the pubic rim.   He did have some diastases in the anterior abdominal wall was somewhat wide.  The overlap was not as good as at like.  Plus he had a recurrence that was rather large on the left side.  Therefore laid a third sheet of mesh as a diamond along the midline.  Inferior tip underneath the pubic rim overlying the dome of the bladder.  This provided much better coverage.  At least >2 inch coverage around the hernia.   Because the defects well covered and not particularly large, I did not place any tacks.  I held the hernia sacs cephalad & evacuated carbon dioxide.  I closed the fascia with absorbable suture.  I closed the skin using 4-0 monocryl stitch.  Sterile dressings were applied. The patient was extubated & arrived in the PACU in stable condition..  I had discussed postoperative care with the patient in the holding area.   I did discuss operative findings and postoperative goals / instructions to the patient's family as well.  Instructions are written in the chart.  Remo Lipps  C. Johney Maine, M.D., F.A.C.S. Gastrointestinal and Minimally Invasive Surgery [facility] [address], [place], [place] [address] [phone] Main / Paging

## 2015-08-13 NOTE — Transfer of Care (Signed)
Immediate Anesthesia Transfer of Care Note  Patient: Tyler Diaz  Procedure(s) Performed: Procedure(s): LAPAROSCOPIC BILATERAL INGUINAL HERNIA REPAIR WITH MESH (Bilateral) INSERTION OF MESH (Bilateral)  Patient Location: PACU  Anesthesia Type:General  Level of Consciousness: awake and alert   Airway & Oxygen Therapy: Patient Spontanous Breathing  Post-op Assessment: Report given to RN and Post -op Vital signs reviewed and stable  Post vital signs: Reviewed  Last Vitals:  Filed Vitals:   08/13/15 0519  BP: 159/81  Pulse: 81  Temp: 36.4 C  Resp: 18    Complications: No apparent anesthesia complications

## 2015-08-13 NOTE — Anesthesia Preprocedure Evaluation (Addendum)
Anesthesia Evaluation  Patient identified by MRN, date of birth, ID band Patient awake    Reviewed: Allergy & Precautions, NPO status   Airway Mallampati: II  TM Distance: >3 FB Neck ROM: Full    Dental   Pulmonary neg pulmonary ROS, former smoker,    breath sounds clear to auscultation       Cardiovascular hypertension, + Peripheral Vascular Disease   Rhythm:Regular Rate:Normal  History noted. CE   Neuro/Psych    GI/Hepatic Neg liver ROS, GERD  ,  Endo/Other  Hypothyroidism   Renal/GU      Musculoskeletal   Abdominal   Peds  Hematology   Anesthesia Other Findings   Reproductive/Obstetrics                            Anesthesia Physical Anesthesia Plan  ASA: III  Anesthesia Plan: General   Post-op Pain Management:    Induction: Intravenous  Airway Management Planned: Oral ETT  Additional Equipment:   Intra-op Plan:   Post-operative Plan: Possible Post-op intubation/ventilation  Informed Consent: I have reviewed the patients History and Physical, chart, labs and discussed the procedure including the risks, benefits and alternatives for the proposed anesthesia with the patient or authorized representative who has indicated his/her understanding and acceptance.   Dental advisory given  Plan Discussed with: CRNA and Anesthesiologist  Anesthesia Plan Comments:         Anesthesia Quick Evaluation

## 2015-08-13 NOTE — Discharge Instructions (Signed)
HERNIA REPAIR: POST OP INSTRUCTIONS ° °1. DIET: Follow a light bland diet the first 24 hours after arrival home, such as soup, liquids, crackers, etc.  Be sure to include lots of fluids daily.  Avoid fast food or heavy meals as your are more likely to get nauseated.  Eat a low fat the next few days after surgery. °2. Take your usually prescribed home medications unless otherwise directed. °3. PAIN CONTROL: °a. Pain is best controlled by a usual combination of three different methods TOGETHER: °i. Ice/Heat °ii. Over the counter pain medication °iii. Prescription pain medication °b. Most patients will experience some swelling and bruising around the hernia(s) such as the bellybutton, groins, or old incisions.  Ice packs or heating pads (30-60 minutes up to 6 times a day) will help. Use ice for the first few days to help decrease swelling and bruising, then switch to heat to help relax tight/sore spots and speed recovery.  Some people prefer to use ice alone, heat alone, alternating between ice & heat.  Experiment to what works for you.  Swelling and bruising can take several weeks to resolve.   °c. It is helpful to take an over-the-counter pain medication regularly for the first few weeks.  Choose one of the following that works best for you: °i. Naproxen (Aleve, etc)  Two 220mg tabs twice a day °ii. Ibuprofen (Advil, etc) Three 200mg tabs four times a day (every meal & bedtime) °iii. Acetaminophen (Tylenol, etc) 325-650mg four times a day (every meal & bedtime) °d. A  prescription for pain medication should be given to you upon discharge.  Take your pain medication as prescribed.  °i. If you are having problems/concerns with the prescription medicine (does not control pain, nausea, vomiting, rash, itching, etc), please call us [phone] to see if we need to switch you to a different pain medicine that will work better for you and/or control your side effect better. °ii. If you need a refill on your pain  medication, please contact your pharmacy.  They will contact our office to request authorization. Prescriptions will not be filled after 5 pm or on week-ends. °4. Avoid getting constipated.  Between the surgery and the pain medications, it is common to experience some constipation.  Increasing fluid intake and taking a fiber supplement (such as Metamucil, Citrucel, FiberCon, MiraLax, etc) 1-2 times a day regularly will usually help prevent this problem from occurring.  A mild laxative (prune juice, Milk of Magnesia, MiraLax, etc) should be taken according to package directions if there are no bowel movements after 48 hours.   °5. Wash / shower every day.  You may shower over the dressings as they are waterproof.   °6. Remove your waterproof bandages 5 days after surgery.  You may leave the incision open to air.  You may replace a dressing/Band-Aid to cover the incision for comfort if you wish.  Continue to shower over incision(s) after the dressing is off. ° ° ° °7. ACTIVITIES as tolerated:   °a. You may resume regular (light) daily activities beginning the next day--such as daily self-care, walking, climbing stairs--gradually increasing activities as tolerated.  If you can walk 30 minutes without difficulty, it is safe to try more intense activity such as jogging, treadmill, bicycling, low-impact aerobics, swimming, etc. °b. Save the most intensive and strenuous activity for last such as sit-ups, heavy lifting, contact sports, etc  Refrain from any heavy lifting or straining until you are off narcotics for pain control.   °  c. DO NOT PUSH THROUGH PAIN.  Let pain be your guide: If it hurts to do something, don't do it.  Pain is your body warning you to avoid that activity for another week until the pain goes down. d. You may drive when you are no longer taking prescription pain medication, you can comfortably wear a seatbelt, and you can safely maneuver your car and apply brakes. e. Dennis Bast may have sexual intercourse  when it is comfortable.  8. FOLLOW UP in our office a. Please call [facility] at [phone] to set up an appointment to see your surgeon in the office for a follow-up appointment approximately 2-3 weeks after your surgery. b. Make sure that you call for this appointment the day you arrive home to insure a convenient appointment time. 9.  IF YOU HAVE DISABILITY OR FAMILY LEAVE FORMS, BRING THEM TO THE OFFICE FOR PROCESSING.  DO NOT GIVE THEM TO YOUR DOCTOR.  WHEN TO CALL us [phone]: 1. Poor pain control 2. Reactions / problems with new medications (rash/itching, nausea, etc)  3. Fever over 101.5 F (38.5 C) 4. Inability to urinate 5. Nausea and/or vomiting 6. Worsening swelling or bruising 7. Continued bleeding from incision. 8. Increased pain, redness, or drainage from the incision   The clinic staff is available to answer your questions during regular business hours (8:30am-5pm).  Please dont hesitate to call and ask to speak to one of our nurses for clinical concerns.   If you have a medical emergency, go to the nearest emergency room or call 911.  A surgeon from [facility] is always on call at the hospitals in [facility], [place], [place], [place], [place]  [address] ?  [address] [address], [place], [place]   [address] MAIN: [phone] ? TOLL FREE: [phone] ? FAX: [phone] [email or web address]    GETTING TO GOOD BOWEL HEALTH. Irregular bowel habits such as constipation and diarrhea can lead to many problems over time.  Having one soft bowel movement a day is the most important way to prevent further problems.  The anorectal canal is designed to handle stretching and feces to safely manage our ability to get rid of solid waste (feces, poop, stool) out of our body.  BUT, hard constipated stools can act like ripping concrete bricks and diarrhea can be a burning fire to this very sensitive area of our body, causing  inflamed hemorrhoids, anal fissures, increasing risk is perirectal abscesses, abdominal pain/bloating, an making irritable bowel worse.      The goal: ONE SOFT BOWEL MOVEMENT A DAY!  To have soft, regular bowel movements:   Drink plenty of fluids, consider 4-6 tall glasses of water a day.    Take plenty of fiber.  Fiber is the undigested part of plant food that passes into the colon, acting s natures broom to encourage bowel motility and movement.  Fiber can absorb and hold large amounts of water. This results in a larger, bulkier stool, which is soft and easier to pass. Work gradually over several weeks up to 6 servings a day of fiber (25g a day even more if needed) in the form of: o Vegetables -- Root (potatoes, carrots, turnips), leafy green (lettuce, salad greens, celery, spinach), or cooked high residue (cabbage, broccoli, etc) o Fruit -- Fresh (unpeeled skin & pulp), Dried (prunes, apricots, cherries, etc ),  or stewed ( applesauce)  o Whole grain breads, pasta, etc (whole wheat)  o Bran cereals  Bulking Agents -- This type of water-retaining fiber generally is easily obtained each day by one of the following:  o Psyllium bran -- The psyllium plant is remarkable because its ground seeds can retain so much water. This product is available as Metamucil, Konsyl, Effersyllium, Per Diem Fiber, or the less expensive generic preparation in drug and health food stores. Although labeled a laxative, it really is not a laxative.  o Methylcellulose -- This is another fiber derived from wood which also retains water. It is available as Citrucel. o Polyethylene Glycol - and artificial fiber commonly called Miralax or Glycolax.  It is helpful for people with gassy or bloated feelings with regular fiber o Flax Seed - a less gassy fiber than psyllium  No reading or other relaxing activity while on the toilet. If bowel movements take longer than 5 minutes, you are too constipated  AVOID CONSTIPATION.   High fiber and water intake usually takes care of this.  Sometimes a laxative is needed to stimulate more frequent bowel movements, but   Laxatives are not a good long-term solution as it can wear the colon out.  They can help jump-start bowels if constipated, but should be relied on constantly without discussing with your doctor o Osmotics (Milk of Magnesia, Fleets phosphosoda, Magnesium citrate, MiraLax, GoLytely) are safer than  o Stimulants (Senokot, Castor Oil, Dulcolax, Ex Lax)    o Avoid taking laxatives for more than 7 days in a row.   IF SEVERELY CONSTIPATED, try a Bowel Retraining Program: o Do not use laxatives.  o Eat a diet high in roughage, such as bran cereals and leafy vegetables.  o Drink six (6) ounces of prune or apricot juice each morning.  o Eat two (2) large servings of stewed fruit each day.  o Take one (1) heaping tablespoon of a psyllium-based bulking agent twice a day. Use sugar-free sweetener when possible to avoid excessive calories.  o Eat a normal breakfast.  o Set aside 15 minutes after breakfast to sit on the toilet, but do not strain to have a bowel movement.  o If you do not have a bowel movement by the third day, use an enema and repeat the above steps.     o TROUBLESHOOTING IRREGULAR BOWELS 1) Avoid extremes of bowel movements (no bad constipation/diarrhea) 2) Miralax 17gm mixed in 8oz. water or juice-daily. May use BID as needed.  3) Gas-x,Phazyme, etc. as needed for gas & bloating.  4) Soft,bland diet. No spicy,greasy,fried foods.  5) Prilosec over-the-counter as needed  6) May hold gluten/wheat products from diet to see if symptoms improve.  7)  May try probiotics (Align, Activa, etc) to help calm the bowels down 7) If symptoms become worse call back immediately.   Managing Pain  Pain after surgery or related to activity is often due to strain/injury to muscle, tendon, nerves and/or incisions.  This pain is usually short-term and will improve in  a few months.   Many people find it helpful to do the following things TOGETHER to help speed the process of healing and to get back to regular activity more quickly:  1. Avoid heavy physical activity at first a. No lifting greater than 20 pounds at first, then increase to lifting as tolerated over the next few weeks b. Do not push through the pain.  Listen to your body and avoid positions and maneuvers than reproduce the pain.  Wait a few days before trying something more intense c. Walking is okay as  tolerated, but go slowly and stop when getting sore.  If you can walk 30 minutes without stopping or pain, you can try more intense activity (running, jogging, aerobics, cycling, swimming, treadmill, sex, sports, weightlifting, etc ) d. Remember: If it hurts to do it, then dont do it!  2. Take Anti-inflammatory medication a. Choose ONE of the following over-the-counter medications: i.            Acetaminophen 500mg  tabs (Tylenol) 1-2 pills with every meal and just before bedtime (avoid if you have liver problems) ii.            Naproxen 220mg  tabs (ex. Aleve) 1-2 pills twice a day (avoid if you have kidney, stomach, IBD, or bleeding problems) iii. Ibuprofen 200mg  tabs (ex. Advil, Motrin) 3-4 pills with every meal and just before bedtime (avoid if you have kidney, stomach, IBD, or bleeding problems) b. Take with food/snack around the clock for 1-2 weeks i. This helps the muscle and nerve tissues become less irritable and calm down faster  3. Use a Heating pad or Ice/Cold Pack a. 4-6 times a day b. May use warm bath/hottub  or showers  4. Try Gentle Massage and/or Stretching  a. at the area of pain many times a day b. stop if you feel pain - do not overdo it  Try these steps together to help you body heal faster and avoid making things get worse.  Doing just one of these things may not be enough.    If you are not getting better after two weeks or are noticing you are getting worse,  contact our office for further advice; we may need to re-evaluate you & see what other things we can do to help.  Inguinal Hernia, Adult Muscles help keep everything in the body in its proper place. But if a weak spot in the muscles develops, something can poke through. That is called a hernia. When this happens in the lower part of the belly (abdomen), it is called an inguinal hernia. (It takes its name from a part of the body in this region called the inguinal canal.) A weak spot in the wall of muscles lets some fat or part of the small intestine bulge through. An inguinal hernia can develop at any age. Men get them more often than women. CAUSES  In adults, an inguinal hernia develops over time.  It can be triggered by:  Suddenly straining the muscles of the lower abdomen.  Lifting heavy objects.  Straining to have a bowel movement. Difficult bowel movements (constipation) can lead to this.  Constant coughing. This may be caused by smoking or lung disease.  Being overweight.  Being pregnant.  Working at a job that requires long periods of standing or heavy lifting.  Having had an inguinal hernia before. One type can be an emergency situation. It is called a strangulated inguinal hernia. It develops if part of the small intestine slips through the weak spot and cannot get back into the abdomen. The blood supply can be cut off. If that happens, part of the intestine may die. This situation requires emergency surgery. SYMPTOMS  Often, a small inguinal hernia has no symptoms. It is found when a healthcare provider does a physical exam. Larger hernias usually have symptoms.   In adults, symptoms may include:  A lump in the groin. This is easier to see when the person is standing. It might disappear when lying down.  In men, a lump in the scrotum.  Pain or burning in the groin. This occurs especially when lifting, straining or coughing.  A dull ache or feeling of pressure in the  groin.  Signs of a strangulated hernia can include:  A bulge in the groin that becomes very painful and tender to the touch.  A bulge that turns red or purple.  Fever, nausea and vomiting.  Inability to have a bowel movement or to pass gas. DIAGNOSIS  To decide if you have an inguinal hernia, a healthcare provider will probably do a physical examination.  This will include asking questions about any symptoms you have noticed.  The healthcare provider might feel the groin area and ask you to cough. If an inguinal hernia is felt, the healthcare provider may try to slide it back into the abdomen.  Usually no other tests are needed. TREATMENT  Treatments can vary. The size of the hernia makes a difference. Options include:  Watchful waiting. This is often suggested if the hernia is small and you have had no symptoms.  No medical procedure will be done unless symptoms develop.  You will need to watch closely for symptoms. If any occur, contact your healthcare provider right away.  Surgery. This is used if the hernia is larger or you have symptoms.  Open surgery. This is usually an outpatient procedure (you will not stay overnight in a hospital). An cut (incision) is made through the skin in the groin. The hernia is put back inside the abdomen. The weak area in the muscles is then repaired by herniorrhaphy or hernioplasty. Herniorrhaphy: in this type of surgery, the weak muscles are sewn back together. Hernioplasty: a patch or mesh is used to close the weak area in the abdominal wall.  Laparoscopy. In this procedure, a surgeon makes small incisions. A thin tube with a tiny video camera (called a laparoscope) is put into the abdomen. The surgeon repairs the hernia with mesh by looking with the video camera and using two long instruments. HOME CARE INSTRUCTIONS   After surgery to repair an inguinal hernia:  You will need to take pain medicine prescribed by your healthcare provider.  Follow all directions carefully.  You will need to take care of the wound from the incision.  Your activity will be restricted for awhile. This will probably include no heavy lifting for several weeks. You also should not do anything too active for a few weeks. When you can return to work will depend on the type of job that you have.  During "watchful waiting" periods, you should:  Maintain a healthy weight.  Eat a diet high in fiber (fruits, vegetables and whole grains).  Drink plenty of fluids to avoid constipation. This means drinking enough water and other liquids to keep your urine clear or pale yellow.  Do not lift heavy objects.  Do not stand for long periods of time.  Quit smoking. This should keep you from developing a frequent cough. SEEK MEDICAL CARE IF:   A bulge develops in your groin area.  You feel pain, a burning sensation or pressure in the groin. This might be worse if you are lifting or straining.  You develop a fever of more than 100.5 F (38.1 C). SEEK IMMEDIATE MEDICAL CARE IF:   Pain in the groin increases suddenly.  A bulge in the groin gets bigger suddenly and does not go down.  For men, there is sudden pain in the scrotum. Or, the size of the scrotum increases.  A bulge in the  groin area becomes red or purple and is painful to touch.  You have nausea or vomiting that does not go away.  You feel your heart beating much faster than normal.  You cannot have a bowel movement or pass gas.  You develop a fever of more than 102.0 F (38.9 C).   This information is not intended to replace advice given to you by your health care provider. Make sure you discuss any questions you have with your health care provider.   Document Released: 10/09/2008 Document Revised: 08/15/2011 Document Reviewed: 11/24/2014 Elsevier Interactive Patient Education [employer].

## 2015-08-13 NOTE — Anesthesia Postprocedure Evaluation (Signed)
Anesthesia Post Note  Patient: Tyler Diaz  Procedure(s) Performed: Procedure(s) (LRB): LAPAROSCOPIC BILATERAL INGUINAL HERNIA REPAIR WITH MESH (Bilateral) INSERTION OF MESH (Bilateral)  Patient location during evaluation: PACU Anesthesia Type: General Level of consciousness: awake Pain management: pain level controlled Vital Signs Assessment: post-procedure vital signs reviewed and stable Respiratory status: spontaneous breathing Cardiovascular status: stable Anesthetic complications: no    Last Vitals:  Filed Vitals:   08/13/15 1130 08/13/15 1311  BP: 141/74 147/74  Pulse: 87 90  Temp: 36.4 C 36.5 C  Resp: 15 16    Last Pain:  Filed Vitals:   08/13/15 1340  PainSc: 4                  EDWARDS,Georg Ang

## 2015-08-13 NOTE — Interval H&P Note (Signed)
History and Physical Interval Note:  08/13/2015 7:16 AM  Tyler Diaz  has presented today for surgery, with the diagnosis of Bilateral inguinal hernia   The various methods of treatment have been discussed with the patient and family. After consideration of risks, benefits and other options for treatment, the patient has consented to  Procedure(s): LAPAROSCOPIC BILATERAL INGUINAL HERNIA REPAIR WITH MESH (Bilateral) INSERTION OF MESH (Bilateral) as a surgical intervention .  The patient's history has been reviewed, patient examined, no change in status, stable for surgery.  I have reviewed the patient's chart and labs.  Questions were answered to the patient's satisfaction.     Lakshmi Sundeen C.

## 2015-10-02 ENCOUNTER — Encounter: Payer: Self-pay | Admitting: *Deleted

## 2015-10-22 ENCOUNTER — Encounter: Payer: Self-pay | Admitting: Family Medicine

## 2015-10-22 ENCOUNTER — Ambulatory Visit (INDEPENDENT_AMBULATORY_CARE_PROVIDER_SITE_OTHER): Payer: PPO | Admitting: Family Medicine

## 2015-10-22 VITALS — BP 137/87 | HR 79 | Temp 97.7°F | Ht 68.5 in | Wt 185.0 lb

## 2015-10-22 DIAGNOSIS — Z125 Encounter for screening for malignant neoplasm of prostate: Secondary | ICD-10-CM

## 2015-10-22 DIAGNOSIS — Z0000 Encounter for general adult medical examination without abnormal findings: Secondary | ICD-10-CM | POA: Diagnosis not present

## 2015-10-22 DIAGNOSIS — I340 Nonrheumatic mitral (valve) insufficiency: Secondary | ICD-10-CM

## 2015-10-22 DIAGNOSIS — I10 Essential (primary) hypertension: Secondary | ICD-10-CM

## 2015-10-22 DIAGNOSIS — E559 Vitamin D deficiency, unspecified: Secondary | ICD-10-CM | POA: Diagnosis not present

## 2015-10-22 DIAGNOSIS — E785 Hyperlipidemia, unspecified: Secondary | ICD-10-CM | POA: Diagnosis not present

## 2015-10-22 DIAGNOSIS — N400 Enlarged prostate without lower urinary tract symptoms: Secondary | ICD-10-CM | POA: Insufficient documentation

## 2015-10-22 MED ORDER — KETOCONAZOLE 2 % EX CREA: 1.0000 "application " | TOPICAL_CREAM | Freq: Two times a day (BID) | CUTANEOUS | Status: DC

## 2015-10-22 MED ORDER — TAMSULOSIN HCL 0.4 MG PO CAPS: 0.8000 mg | ORAL_CAPSULE | Freq: Every day | ORAL | Status: DC

## 2015-10-22 NOTE — Progress Notes (Addendum)
Subjective:   Tyler Diaz is a 70 y.o. male who presents for an Initial Medicare Annual Wellness Visit. Patient Active Problem List   Diagnosis Date Noted  . BPH (benign prostatic hyperplasia) 10/22/2015  . Recurrent left inguinal hernia s/p lap repair w mesh 08/13/2015 08/13/2015  . Right inguinal hernia s/p lap repair w mesh 08/13/2015  . History of SBE (subacute bacterial endocarditis) 07/06/2013  . Mitral regurgitation due to partially flail posterior mitral valve leaflet 07/30/2012  . Hyperlipidemia   . Rectal cancer - distal s/p lap LAR/coloanal July 2014 - [id number]. 07/27/2012  . Hypertension     Review of Systems  Review of Systems  Constitutional: Negative for fever, chills, weight loss, malaise/fatigue and diaphoresis.  HENT: Negative for congestion, ear pain, hearing loss, nosebleeds, sore throat and tinnitus.   Eyes: Negative for blurred vision, double vision, photophobia, pain, discharge and redness.  Respiratory: Negative for cough, hemoptysis, sputum production, shortness of breath and wheezing.   Cardiovascular: Negative for chest pain, palpitations, orthopnea, leg swelling and PND.  Gastrointestinal: Positive for heartburn. Negative for nausea, vomiting, abdominal pain, diarrhea, constipation, blood in stool and melena.  Genitourinary: Positive for frequency (and nocturia X 3-5). Negative for dysuria, urgency, hematuria and flank pain.  Musculoskeletal: Positive for joint pain (ankles tingle since chemo). Negative for myalgias, back pain, falls and neck pain.  Skin: Negative for itching and rash.  Neurological: Negative for dizziness, tingling, tremors, sensory change, speech change, focal weakness, seizures, loss of consciousness, weakness and headaches.  Endo/Heme/Allergies: Negative for environmental allergies and polydipsia. Does not bruise/bleed easily.  Psychiatric/Behavioral: Negative for depression, suicidal ideas, hallucinations, memory loss and substance  abuse. The patient is not nervous/anxious and does not have insomnia.     Current Medications (verified) Outpatient Encounter Prescriptions as of 10/22/2015  Medication Sig  . acetaminophen (TYLENOL) 500 MG tablet Take 500 mg by mouth every 6 (six) hours as needed for headache.  . ketoconazole (NIZORAL) 2 % cream Apply 1 application topically 2 (two) times daily.  Marland Kitchen lisinopril (PRINIVIL,ZESTRIL) 40 MG tablet Take 1 tablet by mouth daily.  . Multiple Vitamin (MULTIVITAMIN WITH MINERALS) TABS Take 1 tablet by mouth every morning.   Marland Kitchen oxymetazoline (AFRIN) 0.05 % nasal spray Place 1 spray into both nostrils 2 (two) times daily as needed for congestion.  . pravastatin (PRAVACHOL) 20 MG tablet Take 20 mg by mouth daily.   . Probiotic Product (DIGESTIVE ADVANTAGE PO) Take 1 tablet by mouth daily.  . [DISCONTINUED] ketoconazole (NIZORAL) 2 % cream Apply 1 application topically daily.  . tamsulosin (FLOMAX) 0.4 MG CAPS capsule Take 2 capsules (0.8 mg total) by mouth at bedtime. For urine flow  . [DISCONTINUED] hydrocortisone (PROCTOSOL HC) 2.5 % rectal cream Place 1 application rectally 2 (two) times daily.  . [DISCONTINUED] oxyCODONE (OXY IR/ROXICODONE) 5 MG immediate release tablet Take 1-2 tablets (5-10 mg total) by mouth every 6 (six) hours as needed for moderate pain, severe pain or breakthrough pain. (Patient not taking: Reported on 10/22/2015)  . [DISCONTINUED] tamsulosin (FLOMAX) 0.4 MG CAPS capsule Reported on 10/22/2015   No facility-administered encounter medications on file as of 10/22/2015.    Allergies (verified) Shrimp   History: Past Medical History  Diagnosis Date  . Mitral regurgitation due to partially flail posterior mitral valve leaflet   . Rectal cancer ([place])   . Hyperlipidemia   . Colon cancer ([place]) 07/18/12 bx    rectum=Invasive adenocarcinoma w/ extracellular mucin,polyp=benign  . Rheumatic fever as child  hx  . Hearing loss     partial greater on left  . Tinnitus       left ear  . Sinusitis     seasonal  . Arthritis     hands/knees  . Hematochezia     recent  . Heart murmur   . Hypothyroidism as child    hx of  . Numbness     TOES / FINGERS DUE TO CHEMO  . History of shingles   . Blood clot in vein     RT ARM WITH PICC LINE   . GERD (gastroesophageal reflux disease)     OCCASIONAL  . Nocturia   . Radiation     FOR COLON CANCER  . Umbilical hernia, incarcerated - s/p primary repair July 2014 01/03/2013  . Herpes zoster - R flank - with severe pain 03/11/2013  . Ileostomy - diverting loop - s/p takedown 06/28/2013 01/14/2013  . C. difficile colitis 08/19/2013  . Hypertension   . Peripheral vascular disease (HCC)     neuropathy due to cancer treatment (fingers and toes)  . Anemia     hx of   Past Surgical History  Procedure Laterality Date  . Carpal tunnel release Right 2002  . Tonsillectomy      as child  . Laparoscopic low anterior resection N/A 12/27/2012    Procedure: LAPAROSCOPIC LOW ANTERIOR RESECTION, COLO-ANAL ANASTOMOSIS, DIVERTING LOOP ILEOSTOMY,SPLENIC FLEXURE MOBILIZATION, PRIMARY INCARCERATED UMBILICAL HERNIA REPAIR;  Surgeon: Adin Hector, MD;  Location: [facility];  Service: General;  Laterality: N/A;  . Laparoscopic low anterior rescection with coloanal anastomosis N/A 12/27/2012    Procedure: LAPAROSCOPIC LOW ANTERIOR RESCECTION WITH COLOANAL ANASTOMOSIS;  Surgeon: Adin Hector, MD;  Location: [facility];  Service: General;  Laterality: N/A;  . Ileostomy N/A 12/27/2012    Procedure: DIVERTING LOOP ILEOSTOMY;  Surgeon: Adin Hector, MD;  Location: [facility];  Service: General;  Laterality: N/A;  . Umbilical hernia repair  12/27/2012    Procedure: PRIMARY REPAIR INCARCERATED UMBILICAL HERNIA;  Surgeon: Adin Hector, MD;  Location: [facility];  Service: General;;  . Inguinal hernia repair Left   . Ileostomy closure N/A 06/28/2013    Procedure: loop ILEOSTOMY TAKEDOWN examination of anesthesia ;  Surgeon: Adin Hector, MD;   Location: [facility];  Service: General;  Laterality: N/A;  . Tee without cardioversion N/A 07/10/2013    Procedure: TRANSESOPHAGEAL ECHOCARDIOGRAM (TEE);  Surgeon: Candee Furbish, MD;  Location: [facility];  Service: Cardiovascular;  Laterality: N/A;  . Tee without cardioversion N/A 08/21/2013    Procedure: TRANSESOPHAGEAL ECHOCARDIOGRAM (TEE);  Surgeon: Sanda Klein, MD;  Location: [facility];  Service: Cardiovascular;  Laterality: N/A;  . Inguinal hernia repair Bilateral 08/13/2015    Procedure: LAPAROSCOPIC BILATERAL INGUINAL HERNIA REPAIR WITH MESH;  Surgeon: Michael Boston, MD;  Location: [facility];  Service: General;  Laterality: Bilateral;  . Insertion of mesh Bilateral 08/13/2015    Procedure: INSERTION OF MESH;  Surgeon: Michael Boston, MD;  Location: [facility];  Service: General;  Laterality: Bilateral;   Family History  Problem Relation Age of Onset  . Cancer Mother     abdominal ca ?ovarian?  . Cancer Sister     liver cancer   Social History   Occupational History  . Retired     Chiropractor   Social History Main Topics  . Smoking status: Former Smoker -- 1.50 packs/day for 20 years    Types: Cigarettes    Quit date: 07/27/1972  .  Smokeless tobacco: Never Used     Comment: quit smoking 35 years ago  . Alcohol Use: No  . Drug Use: No  . Sexual Activity: Not on file    Do you feel safe at home?  Yes Are there smokers in your home (other than you)? No  Dietary issues and exercise activities discussed: Current Exercise Habits: The patient does not participate in regular exercise at present  Current Dietary habits:  Average, bad about sweets, cookies at night, fried chicken, burgers       Objective:    Today's Vitals   10/22/15 0846  BP: 137/87  Pulse: 79  Temp: 97.7 F (36.5 C)  TempSrc: Oral  Height: 5' 8.5" (1.74 m)  Weight: 185 lb (83.915 kg)  SpO2: 96%   Body mass index is 27.72 kg/(m^2). Physical Exam  Constitutional: He is oriented to person,  place, and time. He appears well-developed.  HENT:  Head: Normocephalic and atraumatic.  Right Ear: External ear normal.  Left Ear: External ear normal.  Nose: Nose normal.  Mouth/Throat: Oropharynx is clear and moist.  Eyes: Conjunctivae and EOM are normal. Pupils are equal, round, and reactive to light. Right eye exhibits no discharge. Left eye exhibits no discharge.  Neck: Normal range of motion. Neck supple. No JVD present. No thyromegaly present.  Cardiovascular: Normal rate and regular rhythm.  Exam reveals no gallop and no friction rub.   Murmur heard. Pulmonary/Chest: Effort normal and breath sounds normal. No stridor. No respiratory distress. He has no wheezes. He has no rales. He exhibits no tenderness.  Abdominal: Soft. Bowel sounds are normal. He exhibits no distension and no mass. There is no tenderness. There is no rebound and no guarding. No hernia.  Genitourinary: Prostate normal and penis normal.  Lymphadenopathy:    He has no cervical adenopathy.  Neurological: He is alert and oriented to person, place, and time. He displays normal reflexes. No cranial nerve deficit. He exhibits normal muscle tone. Coordination normal.  Skin: Skin is warm and dry. No rash noted. No erythema.  Psychiatric: He has a normal mood and affect. His behavior is normal. Judgment and thought content normal.  Vitals reviewed.    Activities of Daily Living In your present state of health, do you have any difficulty performing the following activities: 10/22/2015 08/06/2015  Hearing? N Y  Vision? N N  Difficulty concentrating or making decisions? N Y  Walking or climbing stairs? N N  Dressing or bathing? N N  Doing errands, shopping? N N     Depression Screen PHQ 2/9 Scores 10/22/2015 01/23/2015 08/28/2013 07/30/2013  PHQ - 2 Score 0 0 0 0     Fall Risk Fall Risk  10/22/2015 01/23/2015 04/17/2014 08/28/2013 07/30/2013  Falls in the past year? No No No No No    Cognitive Function: No flowsheet  data found.  Immunizations and Health Maintenance Immunization History  Administered Date(s) Administered  . Influenza,inj,Quad PF,36+ Mos 05/21/2013, 03/13/2014, 03/10/2015  . Pneumococcal Polysaccharide-23 05/21/2013   Health Maintenance Due  Topic Date Due  . Hepatitis C Screening  [birth year]    Patient Care Team: Claretta Fraise, MD as PCP - General (Family Medicine) Michael Boston, MD as Consulting Physician (Colon and Rectal Surgery) Wilford Corner, MD as Consulting Physician (Gastroenterology) Gatha Mayer, MD as Consulting Physician (Radiation Oncology) Carola Frost, RN as Registered Nurse (Oncology) [name], MD as Consulting Physician (Oncology) Carlyle Basques, MD as Consulting Physician (Infectious Diseases)  Indicate any recent Medical Services  you may have received from other than [facility] providers in the past year (date may be approximate).    Assessment:    Annual Wellness Visit    Screening Tests Health Maintenance  Topic Date Due  . Hepatitis C Screening  11-02-1945  . TETANUS/TDAP  01/22/2016 (Originally 09/11/1962)  . ZOSTAVAX  04/23/2016 (Originally 09/11/2003)  . INFLUENZA VACCINE  01/05/2016  . COLONOSCOPY  12/05/2023  . PNA vac Low Risk Adult  Completed        Plan:   During the course of the visit Shedrick was educated and counseled about the following appropriate screening and preventive services:   Vaccines to include Pneumoccal, Influenza,  Td, Zostavax,  Colorectal cancer screening  Cardiovascular disease screening  Diabetes screening  Bone Denisty / Osteoporosis Screening  Glaucoma screening / Diabetic Eye Exam  Nutrition counseling  Prostate cancer screening  Smoking cessation counseling  Advanced Directives  Physical Activity   Goals    . Exercise 150 minutes per week (moderate activity)     Walking the hill helps some, but may not be enough.    . Increase lean proteins     I recommend reducing fried foods and  sweets and substituting with lean proteins such as low or no fat dairy such as greek yogurt and chicken fish and [place] - prepared without frying!        Patient Instructions (the written plan) were given to the patient.   Claretta Fraise, MD   10/22/2015

## 2015-10-22 NOTE — Addendum Note (Signed)
Addended by: Claretta Fraise on: 10/22/2015 09:51 AM   Modules accepted: Orders, SmartSet

## 2015-10-23 LAB — CMP14+EGFR
ALT: 18 IU/L (ref 0–44)
AST: 21 IU/L (ref 0–40)
Albumin/Globulin Ratio: 2 (ref 1.2–2.2)
Albumin: 4.5 g/dL (ref 3.5–4.8)
Alkaline Phosphatase: 52 IU/L (ref 39–117)
BUN/Creatinine Ratio: 15 (ref 10–24)
BUN: 16 mg/dL (ref 8–27)
Bilirubin Total: 0.9 mg/dL (ref 0.0–1.2)
CALCIUM: 9.7 mg/dL (ref 8.6–10.2)
CHLORIDE: 101 mmol/L (ref 96–106)
CO2: 24 mmol/L (ref 18–29)
CREATININE: 1.04 mg/dL (ref 0.76–1.27)
GFR, EST AFRICAN AMERICAN: 83 mL/min/{1.73_m2} (ref >59)
GFR, EST NON AFRICAN AMERICAN: 71 mL/min/{1.73_m2} (ref >59)
GLUCOSE: 98 mg/dL (ref 65–99)
Globulin, Total: 2.2 g/dL (ref 1.5–4.5)
Potassium: 4.5 mmol/L (ref 3.5–5.2)
Sodium: 142 mmol/L (ref 134–144)
TOTAL PROTEIN: 6.7 g/dL (ref 6.0–8.5)

## 2015-10-23 LAB — CBC WITH DIFFERENTIAL/PLATELET
BASOS ABS: 0.1 10*3/uL (ref 0.0–0.2)
Basos: 1 %
EOS (ABSOLUTE): 0.3 10*3/uL (ref 0.0–0.4)
Eos: 4 %
Hematocrit: 40 % (ref 37.5–51.0)
Hemoglobin: 12.9 g/dL (ref 12.6–17.7)
IMMATURE GRANS (ABS): 0 10*3/uL (ref 0.0–0.1)
Immature Granulocytes: 1 %
LYMPHS: 17 %
Lymphocytes Absolute: 1.4 10*3/uL (ref 0.7–3.1)
MCH: 32.2 pg (ref 26.6–33.0)
MCHC: 32.3 g/dL (ref 31.5–35.7)
MCV: 100 fL — ABNORMAL HIGH (ref 79–97)
MONOS ABS: 0.6 10*3/uL (ref 0.1–0.9)
Monocytes: 8 %
NEUTROS ABS: 5.7 10*3/uL (ref 1.4–7.0)
NEUTROS PCT: 69 %
PLATELETS: 182 10*3/uL (ref 150–379)
RBC: 4.01 x10E6/uL — ABNORMAL LOW (ref 4.14–5.80)
RDW: 15.6 % — AB (ref 12.3–15.4)
WBC: 8.2 10*3/uL (ref 3.4–10.8)

## 2015-10-23 LAB — LIPID PANEL
CHOLESTEROL TOTAL: 147 mg/dL (ref 100–199)
Chol/HDL Ratio: 3.6 ratio units (ref 0.0–5.0)
HDL: 41 mg/dL (ref >39)
LDL Calculated: 79 mg/dL (ref 0–99)
Triglycerides: 136 mg/dL (ref 0–149)
VLDL CHOLESTEROL CAL: 27 mg/dL (ref 5–40)

## 2015-10-23 LAB — PSA TOTAL (REFLEX TO FREE): Prostate Specific Ag, Serum: 0.6 ng/mL (ref 0.0–4.0)

## 2015-10-23 LAB — VITAMIN D 25 HYDROXY (VIT D DEFICIENCY, FRACTURES): VIT D 25 HYDROXY: 35.2 ng/mL (ref 30.0–100.0)

## 2015-11-09 DIAGNOSIS — Z85048 Personal history of other malignant neoplasm of rectum, rectosigmoid junction, and anus: Secondary | ICD-10-CM | POA: Diagnosis not present

## 2015-11-09 DIAGNOSIS — I10 Essential (primary) hypertension: Secondary | ICD-10-CM | POA: Diagnosis not present

## 2015-11-09 DIAGNOSIS — E785 Hyperlipidemia, unspecified: Secondary | ICD-10-CM | POA: Diagnosis not present

## 2015-11-09 DIAGNOSIS — I340 Nonrheumatic mitral (valve) insufficiency: Secondary | ICD-10-CM | POA: Diagnosis not present

## 2015-11-09 DIAGNOSIS — Z86718 Personal history of other venous thrombosis and embolism: Secondary | ICD-10-CM | POA: Diagnosis not present

## 2015-12-01 ENCOUNTER — Encounter: Payer: Self-pay | Admitting: Family Medicine

## 2016-01-19 ENCOUNTER — Encounter: Payer: Self-pay | Admitting: Nurse Practitioner

## 2016-01-19 ENCOUNTER — Ambulatory Visit (HOSPITAL_BASED_OUTPATIENT_CLINIC_OR_DEPARTMENT_OTHER): Payer: PPO | Admitting: Nurse Practitioner

## 2016-01-19 ENCOUNTER — Telehealth: Payer: Self-pay | Admitting: Oncology

## 2016-01-19 ENCOUNTER — Other Ambulatory Visit (HOSPITAL_BASED_OUTPATIENT_CLINIC_OR_DEPARTMENT_OTHER): Payer: PPO

## 2016-01-19 VITALS — BP 115/69 | HR 89 | Temp 98.0°F | Resp 17 | Ht 68.5 in | Wt 183.4 lb

## 2016-01-19 DIAGNOSIS — Z85048 Personal history of other malignant neoplasm of rectum, rectosigmoid junction, and anus: Secondary | ICD-10-CM

## 2016-01-19 DIAGNOSIS — C20 Malignant neoplasm of rectum: Secondary | ICD-10-CM

## 2016-01-19 LAB — CEA (IN HOUSE-CHCC): CEA (CHCC-In House): <1 ng/mL (ref 0.00–5.00)

## 2016-01-19 NOTE — Progress Notes (Signed)
  [facility] OFFICE PROGRESS NOTE   Diagnosis:  Rectal cancer  INTERVAL HISTORY:   Tyler Diaz returns as scheduled. He overall feels well. He continues to have periodic loose stools. No bloody or black stools. No nausea or vomiting. He reports a good appetite. No shortness of breath.  Objective:  Vital signs in last 24 hours:  Blood pressure 115/69, pulse 89, temperature 98 F (36.7 C), temperature source Oral, resp. rate 17, height 5' 8.5" (1.74 m), weight 183 lb 6.4 oz (83.2 kg), SpO2 99 %.    HEENT: No thrush or ulcers. Lymphatics: No palpable cervical, supraclavicular, axillary or inguinal lymph nodes. Resp: Lungs clear bilaterally. Cardio: Regular rate and rhythm. GI: Abdomen soft and nontender. No hepatomegaly. No mass. Vascular: No leg edema.   Lab Results:  Lab Results  Component Value Date   WBC 8.2 10/22/2015   HGB 13.8 07/20/2015   HCT 40.0 10/22/2015   MCV 100 (H) 10/22/2015   PLT 182 10/22/2015   NEUTROABS 5.7 10/22/2015    Imaging:  No results found.  Medications: I have reviewed the patient's current medications.  Assessment/Plan: 1. Stage III rectal cancer, presenting with a locally advanced rectal tumor. Diagnosed February 2014  CT/MRI evidence of perirectal lymphadenopathy.   Status post neoadjuvant Xeloda and [facility] at the [facility].   Status post low anterior resection and coloanal anastomosis 12/27/2012 with pathology confirming a ypT3, ypN1b tumor, microsatellite stable.   Initiation of adjuvant CAPOX chemotherapy 01/30/2013.   Cycle 2 adjuvant CAPOX 02/20/2013.   Cycle 3 adjuvant CAPOX 03/13/2013.   Cycle 4 adjuvant CAPOX 04/03/2013 (oxaliplatin dose reduced with cycle 4 due to neuropathy, Xeloda dose reduced with cycle 4 due to hand-foot syndrome).   Cycle 5 adjuvant CAPOX 04/24/2013.  Surveillance colonoscopy 12/04/2013. Perianal erythema and tight anorectal sphincter. One 4 mm polyp in the sigmoid  colon completely resected. Diverticulosis in the sigmoid colon. Repeat colonoscopy in 3 years for surveillance.  CTs 07/15/2014 with stable lung nodules, no evidence of metastatic disease  CTs 07/20/2015-no evidence of metastatic disease 2. Small bilateral lung nodules and a mildly enlarged gastrohepatic node on the initial staging CT scans 08/02/2012.  CT chest on 01/23/2013 with stable bilateral pulmonary nodules.  CT chest 07/15/2014 with stable lung nodules  CT chest 07/20/2015-stable lung nodules 3. Elevated pretreatment CEA.  4. Mitral regurgitation. 5. Hypertension. 6. History of rheumatic fever. 7. Pain and swelling at the right arm on 01/18/2013.  Doppler confirmed a right upper extremity deep vein thrombosis.   Daily Lovenox started on 01/18/2013.  8. PICC and Lovenox discontinued after an office visit 05/21/2013.  9. Acute herpes zoster affecting right thoracic dermatomes September 2014. He completed a course of valacyclovir. 10. Pain related to herpes zoster. Resolved. 11. Delayed nausea following cycle 1 CAPOX. Aloxi added to the premedication regimen beginning with cycle 2. 12. Diarrhea following cycle 2 CAPOX. 13. History of hand/foot syndrome secondary to Xeloda. 14. Ileostomy takedown procedure 06/28/2013. 15. Enterococcus bacteremia, probable endocarditis 07/05/2013.  16. PICC line placement for IV antibiotics 07/11/2013. 41. C. difficile colitis March 2015   Disposition: Tyler Diaz remains in clinical remission from rectal cancer. We will follow-up on the CEA from today. He will return for a follow-up visit and CEA in 6 months. He will contact the office in the interim with any problems.  Plan reviewed with Dr. Benay Diaz.    Tyler Diaz ANP/GNP-BC   01/19/2016  11:07 AM

## 2016-01-19 NOTE — Telephone Encounter (Signed)
GAVE PATIENT AVS REPORT AND APPOINTMENTS FOR [facility] 2018

## 2016-01-20 ENCOUNTER — Telehealth: Payer: Self-pay | Admitting: *Deleted

## 2016-01-20 LAB — CEA: CEA1: 1.4 ng/mL (ref 0.0–4.7)

## 2016-01-20 NOTE — Telephone Encounter (Signed)
Informed pt's wife of normal lab result. Requested she have pt call office with any questions.

## 2016-01-20 NOTE — Telephone Encounter (Signed)
-----   Message from [name], MD sent at 01/20/2016  3:05 PM EDT ----- Please call patient, cea is normal

## 2016-02-09 DIAGNOSIS — C20 Malignant neoplasm of rectum: Secondary | ICD-10-CM | POA: Diagnosis not present

## 2016-02-18 DIAGNOSIS — H40033 Anatomical narrow angle, bilateral: Secondary | ICD-10-CM | POA: Diagnosis not present

## 2016-02-18 DIAGNOSIS — H2513 Age-related nuclear cataract, bilateral: Secondary | ICD-10-CM | POA: Diagnosis not present

## 2016-03-14 ENCOUNTER — Ambulatory Visit (INDEPENDENT_AMBULATORY_CARE_PROVIDER_SITE_OTHER): Payer: PPO

## 2016-03-14 DIAGNOSIS — Z23 Encounter for immunization: Secondary | ICD-10-CM | POA: Diagnosis not present

## 2016-04-25 ENCOUNTER — Ambulatory Visit (INDEPENDENT_AMBULATORY_CARE_PROVIDER_SITE_OTHER): Payer: PPO | Admitting: Family Medicine

## 2016-04-25 ENCOUNTER — Encounter: Payer: Self-pay | Admitting: Family Medicine

## 2016-04-25 VITALS — BP 103/63 | HR 76 | Temp 97.0°F | Ht 68.5 in | Wt 184.0 lb

## 2016-04-25 DIAGNOSIS — N401 Enlarged prostate with lower urinary tract symptoms: Secondary | ICD-10-CM | POA: Diagnosis not present

## 2016-04-25 DIAGNOSIS — R718 Other abnormality of red blood cells: Secondary | ICD-10-CM

## 2016-04-25 DIAGNOSIS — I10 Essential (primary) hypertension: Secondary | ICD-10-CM

## 2016-04-25 DIAGNOSIS — R351 Nocturia: Secondary | ICD-10-CM | POA: Diagnosis not present

## 2016-04-25 DIAGNOSIS — E782 Mixed hyperlipidemia: Secondary | ICD-10-CM | POA: Diagnosis not present

## 2016-04-25 DIAGNOSIS — Z7689 Persons encountering health services in other specified circumstances: Secondary | ICD-10-CM | POA: Diagnosis not present

## 2016-04-25 MED ORDER — CHLORTHALIDONE 25 MG PO TABS: 12.5000 mg | ORAL_TABLET | ORAL | 3 refills | Status: DC

## 2016-04-25 NOTE — Progress Notes (Signed)
Subjective:  Patient ID: Tyler Diaz, male    DOB: [birth year]  Age: 70 y.o. MRN: [id number]  CC: Hypertension (pt here for routine 6 month follow up, no complaints voiced )   HPI JD MCCASTER presents for  follow-up of hypertension. Patient has no history of headache chest pain or shortness of breath or recent cough. Patient also denies symptoms of TIA such as numbness weakness lateralizing. Patient checks  blood pressure at home and has not had any elevated readings recently. Patient denies side effects from medication. States taking it regularly. dOES NOT [place].  Patient in for follow-up of elevated cholesterol. Doing well without complaints on current medication. Denies side effects of statin including myalgia and arthralgia and nausea. Also in today for liver function testing. Currently no chest pain, shortness of breath or other cardiovascular related symptoms noted. History Nolin has a past medical history of Anemia; Arthritis; Blood clot in vein; C. difficile colitis (08/19/2013); Colon cancer ([place]) (07/18/12 bx); GERD (gastroesophageal reflux disease); Hearing loss; Heart murmur; Hematochezia; Herpes zoster - R flank - with severe pain (03/11/2013); History of shingles; Hyperlipidemia; Hypertension; Hypothyroidism (as child); Ileostomy - diverting loop - s/p takedown 06/28/2013 (01/14/2013); Mitral regurgitation due to partially flail posterior mitral valve leaflet; Nocturia; Numbness; Peripheral vascular disease ([place]); Radiation; Rectal cancer ([place]); Rheumatic fever (as child); Sinusitis; Tinnitus; and Umbilical hernia, incarcerated - s/p primary repair July 2014 (01/03/2013).   He has a past surgical history that includes Carpal tunnel release (Right, 2002); Tonsillectomy; Laparoscopic low anterior resection (N/A, 12/27/2012); Laparoscopic low anterior rescection with coloanal anastomosis (N/A, 12/27/2012); Ileostomy (N/A, 12/27/2012); Umbilical hernia repair  (12/27/2012); Inguinal hernia repair (Left); Ileostomy closure (N/A, 06/28/2013); TEE without cardioversion (N/A, 07/10/2013); TEE without cardioversion (N/A, 08/21/2013); Inguinal hernia repair (Bilateral, 08/13/2015); and Insertion of mesh (Bilateral, 08/13/2015).   His family history includes Cancer in his mother and sister.He reports that he quit smoking about 43 years ago. His smoking use included Cigarettes. He has a 30.00 pack-year smoking history. He has never used smokeless tobacco. He reports that he does not drink alcohol or use drugs.  Current Outpatient Prescriptions on File Prior to Visit  Medication Sig Dispense Refill  . acetaminophen (TYLENOL) 500 MG tablet Take 500 mg by mouth every 6 (six) hours as needed for headache.    . ketoconazole (NIZORAL) 2 % cream Apply 1 application topically 2 (two) times daily. 60 g 5  . lisinopril (PRINIVIL,ZESTRIL) 40 MG tablet Take 1 tablet by mouth daily.  3  . Multiple Vitamin (MULTIVITAMIN WITH MINERALS) TABS Take 1 tablet by mouth every morning.     Marland Kitchen oxymetazoline (AFRIN) 0.05 % nasal spray Place 1 spray into both nostrils 2 (two) times daily as needed for congestion.    . pravastatin (PRAVACHOL) 20 MG tablet Take 20 mg by mouth daily.     . Probiotic Product (DIGESTIVE ADVANTAGE PO) Take 1 tablet by mouth daily.     No current facility-administered medications on file prior to visit.     ROS Review of Systems  Constitutional: Negative for chills, diaphoresis, fever and unexpected weight change.  HENT: Negative for congestion, hearing loss, rhinorrhea and sore throat.   Eyes: Negative for visual disturbance.  Respiratory: Negative for cough and shortness of breath.   Cardiovascular: Negative for chest pain.  Gastrointestinal: Negative for abdominal pain, constipation and diarrhea.  Genitourinary: Negative for dysuria and flank pain.  Musculoskeletal: Positive for arthralgias (primarily knees. Manageable. ). Negative  for joint swelling.  Skin:  Negative for rash.  Neurological: Negative for dizziness and headaches.  Psychiatric/Behavioral: Negative for dysphoric mood and sleep disturbance.    Objective:  BP 103/63   Pulse 76   Temp 97 F (36.1 C) (Oral)   Ht 5' 8.5" (1.74 m)   Wt 184 lb (83.5 kg)   BMI 27.57 kg/m   BP Readings from Last 3 Encounters:  04/25/16 103/63  01/19/16 115/69  10/22/15 137/87    Wt Readings from Last 3 Encounters:  04/25/16 184 lb (83.5 kg)  01/19/16 183 lb 6.4 oz (83.2 kg)  10/22/15 185 lb (83.9 kg)     Physical Exam  Constitutional: He is oriented to person, place, and time. He appears well-developed and well-nourished. No distress.  HENT:  Head: Normocephalic and atraumatic.  Nose: Nose normal.  Eyes: Conjunctivae and EOM are normal. Pupils are equal, round, and reactive to light.  Neck: Normal range of motion. Neck supple. No thyromegaly present.  Cardiovascular: Normal rate, regular rhythm and normal heart sounds.   No murmur heard. Pulmonary/Chest: Effort normal and breath sounds normal. No respiratory distress. He has no wheezes. He has no rales.  Abdominal: Soft. Bowel sounds are normal. He exhibits no distension. There is no tenderness.  Lymphadenopathy:    He has no cervical adenopathy.  Neurological: He is alert and oriented to person, place, and time. He has normal reflexes.  Skin: Skin is warm and dry.  Psychiatric: He has a normal mood and affect. His behavior is normal. Judgment and thought content normal.     Lab Results  Component Value Date   WBC 8.2 10/22/2015   HGB 13.8 07/20/2015   HCT 40.0 10/22/2015   PLT 182 10/22/2015   GLUCOSE 98 10/22/2015   CHOL 147 10/22/2015   TRIG 136 10/22/2015   HDL 41 10/22/2015   LDLCALC 79 10/22/2015   ALT 18 10/22/2015   AST 21 10/22/2015   NA 142 10/22/2015   K 4.5 10/22/2015   CL 101 10/22/2015   CREATININE 1.04 10/22/2015   BUN 16 10/22/2015   CO2 24 10/22/2015    No results found.  Assessment & Plan:    [place] was seen today for hypertension.  Diagnoses and all orders for this visit:  Benign prostatic hyperplasia with nocturia -     CBC with Differential/Platelet -     CMP14+EGFR  Mixed hyperlipidemia -     CBC with Differential/Platelet -     CMP14+EGFR -     Lipid panel  Essential hypertension -     CBC with Differential/Platelet -     CMP14+EGFR  Other orders -     chlorthalidone (HYGROTON) 25 MG tablet; Take 0.5 tablets (12.5 mg total) by mouth every morning.   I have changed Mr. Coy's chlorthalidone. I am also having him maintain his multivitamin with minerals, oxymetazoline, acetaminophen, pravastatin, lisinopril, Probiotic Product (DIGESTIVE ADVANTAGE PO), and ketoconazole.  Meds ordered this encounter  Medications  . chlorthalidone (HYGROTON) 25 MG tablet    Sig: Take 0.5 tablets (12.5 mg total) by mouth every morning.    Dispense:  45 tablet    Refill:  3      Follow-up: Return in about 6 months (around 10/23/2016) for hypertension, cholesterol.  Claretta Fraise, M.D.

## 2016-04-26 LAB — CBC WITH DIFFERENTIAL/PLATELET
BASOS ABS: 0.1 10*3/uL (ref 0.0–0.2)
BASOS: 1 %
EOS (ABSOLUTE): 0.7 10*3/uL — AB (ref 0.0–0.4)
Eos: 11 %
Hematocrit: 37.5 % (ref 37.5–51.0)
Hemoglobin: 12.3 g/dL — ABNORMAL LOW (ref 12.6–17.7)
Immature Grans (Abs): 0 10*3/uL (ref 0.0–0.1)
Immature Granulocytes: 1 %
Lymphocytes Absolute: 1.1 10*3/uL (ref 0.7–3.1)
Lymphs: 17 %
MCH: 33.5 pg — AB (ref 26.6–33.0)
MCHC: 32.8 g/dL (ref 31.5–35.7)
MCV: 102 fL — AB (ref 79–97)
MONOS ABS: 0.8 10*3/uL (ref 0.1–0.9)
Monocytes: 12 %
NEUTROS ABS: 3.6 10*3/uL (ref 1.4–7.0)
NEUTROS PCT: 58 %
PLATELETS: 173 10*3/uL (ref 150–379)
RBC: 3.67 x10E6/uL — ABNORMAL LOW (ref 4.14–5.80)
RDW: 13.9 % (ref 12.3–15.4)
WBC: 6.3 10*3/uL (ref 3.4–10.8)

## 2016-04-26 LAB — CMP14+EGFR
A/G RATIO: 2.4 — AB (ref 1.2–2.2)
ALT: 22 IU/L (ref 0–44)
AST: 18 IU/L (ref 0–40)
Albumin: 4.6 g/dL (ref 3.5–4.8)
Alkaline Phosphatase: 44 IU/L (ref 39–117)
BILIRUBIN TOTAL: 0.9 mg/dL (ref 0.0–1.2)
BUN/Creatinine Ratio: 21 (ref 10–24)
BUN: 21 mg/dL (ref 8–27)
CHLORIDE: 103 mmol/L (ref 96–106)
CO2: 24 mmol/L (ref 18–29)
Calcium: 9.1 mg/dL (ref 8.6–10.2)
Creatinine, Ser: 1.02 mg/dL (ref 0.76–1.27)
GFR calc Af Amer: 84 mL/min/{1.73_m2} (ref >59)
GFR calc non Af Amer: 73 mL/min/{1.73_m2} (ref >59)
GLUCOSE: 101 mg/dL — AB (ref 65–99)
Globulin, Total: 1.9 g/dL (ref 1.5–4.5)
POTASSIUM: 4.9 mmol/L (ref 3.5–5.2)
Sodium: 140 mmol/L (ref 134–144)
Total Protein: 6.5 g/dL (ref 6.0–8.5)

## 2016-04-26 LAB — LIPID PANEL
Chol/HDL Ratio: 4.1 ratio units (ref 0.0–5.0)
Cholesterol, Total: 156 mg/dL (ref 100–199)
HDL: 38 mg/dL — AB (ref >39)
LDL Calculated: 89 mg/dL (ref 0–99)
TRIGLYCERIDES: 147 mg/dL (ref 0–149)
VLDL Cholesterol Cal: 29 mg/dL (ref 5–40)

## 2016-04-30 LAB — B12 AND FOLATE PANEL
Folate: >20 ng/mL (ref >3.0)
Vitamin B-12: 450 pg/mL (ref 211–946)

## 2016-04-30 LAB — SPECIMEN STATUS REPORT

## 2016-05-01 ENCOUNTER — Other Ambulatory Visit: Payer: Self-pay | Admitting: Family Medicine

## 2016-05-01 DIAGNOSIS — D469 Myelodysplastic syndrome, unspecified: Secondary | ICD-10-CM

## 2016-05-02 NOTE — Addendum Note (Signed)
Addended by: Thana Ates on: 05/02/2016 08:58 AM   Modules accepted: Orders

## 2016-05-06 ENCOUNTER — Telehealth: Payer: Self-pay | Admitting: *Deleted

## 2016-05-06 NOTE — Telephone Encounter (Signed)
Call placed to patient's wife to inform her per Dr. Benay Spice, that there is no need to be concerned regarding patient's lab work from PCP and that there is no need to see Dr. Benay Spice earlier then his current scheduled appt in February of 2018.  Patient's wife appreciative of call back and has no further questions or concerns at this time.

## 2016-05-06 NOTE — Telephone Encounter (Signed)
Call received from patient stating that Dr. Livia Snellen informed him that Dr. Gearldine Shown office would be contacting him for an appt d/t his RBC dropping to 3.6 from 4.0.  Patient stated that he would like a call back between 4 and 5:00PM this evening.

## 2016-05-16 DIAGNOSIS — Z85048 Personal history of other malignant neoplasm of rectum, rectosigmoid junction, and anus: Secondary | ICD-10-CM | POA: Diagnosis not present

## 2016-05-16 DIAGNOSIS — E785 Hyperlipidemia, unspecified: Secondary | ICD-10-CM | POA: Diagnosis not present

## 2016-05-16 DIAGNOSIS — I340 Nonrheumatic mitral (valve) insufficiency: Secondary | ICD-10-CM | POA: Diagnosis not present

## 2016-05-16 DIAGNOSIS — Z86718 Personal history of other venous thrombosis and embolism: Secondary | ICD-10-CM | POA: Diagnosis not present

## 2016-05-16 DIAGNOSIS — I10 Essential (primary) hypertension: Secondary | ICD-10-CM | POA: Diagnosis not present

## 2016-06-02 ENCOUNTER — Other Ambulatory Visit: Payer: Self-pay | Admitting: Nurse Practitioner

## 2016-07-18 ENCOUNTER — Telehealth: Payer: Self-pay | Admitting: Oncology

## 2016-07-18 ENCOUNTER — Other Ambulatory Visit (HOSPITAL_BASED_OUTPATIENT_CLINIC_OR_DEPARTMENT_OTHER): Payer: PPO

## 2016-07-18 ENCOUNTER — Ambulatory Visit (HOSPITAL_BASED_OUTPATIENT_CLINIC_OR_DEPARTMENT_OTHER): Payer: PPO | Admitting: Oncology

## 2016-07-18 VITALS — BP 136/74 | HR 69 | Temp 98.0°F | Resp 17 | Ht 68.5 in | Wt 182.3 lb

## 2016-07-18 DIAGNOSIS — C20 Malignant neoplasm of rectum: Secondary | ICD-10-CM

## 2016-07-18 DIAGNOSIS — D539 Nutritional anemia, unspecified: Secondary | ICD-10-CM | POA: Diagnosis not present

## 2016-07-18 DIAGNOSIS — Z85048 Personal history of other malignant neoplasm of rectum, rectosigmoid junction, and anus: Secondary | ICD-10-CM

## 2016-07-18 DIAGNOSIS — R152 Fecal urgency: Secondary | ICD-10-CM

## 2016-07-18 DIAGNOSIS — R21 Rash and other nonspecific skin eruption: Secondary | ICD-10-CM

## 2016-07-18 DIAGNOSIS — R194 Change in bowel habit: Secondary | ICD-10-CM

## 2016-07-18 LAB — CEA (IN HOUSE-CHCC): CEA (CHCC-In House): <1 ng/mL (ref 0.00–5.00)

## 2016-07-18 NOTE — Telephone Encounter (Signed)
Appointments scheduled per 07/18/16 los. Patient was given a copy of the AVS report and appointment schedule per 07/18/16 los. °

## 2016-07-18 NOTE — Progress Notes (Signed)
Tyler Diaz   Diagnosis: Rectal cancer  INTERVAL HISTORY:   Tyler Diaz returns as scheduled. He feels well. He continues to have irregular bowel habits and rectal urgency. He says he has a blood on the toilet paper after a firm bowel movement. No other bleeding. He is due for a  colonoscopy this summer.  Objective:  Vital signs in last 24 hours:  Blood pressure 136/74, pulse 69, temperature 98 F (36.7 C), temperature source Oral, resp. rate 17, height 5' 8.5" (1.74 m), weight 182 lb 4.8 oz (82.7 kg), SpO2 100 %.    HEENT: Neck without mass Lymphatics: No cervical, supraclavicular, axillary, or inguinal nodes Resp: Lungs clear bilaterally Cardio: Regular rate and rhythm GI: No hepatosplenomegaly, no mass, nontender Vascular: No leg edema  Skin: Plaque of erythema at the mid lower back   Lab Results:  Lab Results  Component Value Date   WBC 6.3 04/25/2016   HGB 13.8 07/20/2015   HCT 37.5 04/25/2016   MCV 102 (H) 04/25/2016   PLT 173 04/25/2016   NEUTROABS 3.6 04/25/2016   CEA-pending  Medications: I have reviewed the patient's current medications.  Assessment/Plan: 1. Stage III rectal cancer, presenting with a locally advanced rectal tumor. Diagnosed February 2014  CT/MRI evidence of perirectal lymphadenopathy.   Status post neoadjuvant Xeloda and radiation at the [facility].   Status post low anterior resection and coloanal anastomosis 12/27/2012 with pathology confirming a ypT3, ypN1b tumor, microsatellite stable.   Initiation of adjuvant CAPOX chemotherapy 01/30/2013.   Cycle 2 adjuvant CAPOX 02/20/2013.   Cycle 3 adjuvant CAPOX 03/13/2013.   Cycle 4 adjuvant CAPOX 04/03/2013 (oxaliplatin dose reduced with cycle 4 due to neuropathy, Xeloda dose reduced with cycle 4 due to hand-foot syndrome).   Cycle 5 adjuvant CAPOX 04/24/2013.  Surveillance colonoscopy 12/04/2013. Perianal erythema and tight anorectal  sphincter. One 4 mm polyp in the sigmoid colon completely resected. Diverticulosis in the sigmoid colon. Repeat colonoscopy in 3 years for surveillance.  CTs 07/15/2014 with stable lung nodules, no evidence of metastatic disease  CTs 07/20/2015-no evidence of metastatic disease 2. Small bilateral lung nodules and a mildly enlarged gastrohepatic node on the initial staging CT scans 08/02/2012.  CT chest on 01/23/2013 with stable bilateral pulmonary nodules.  CT chest 07/15/2014 with stable lung nodules  CT chest 07/20/2015-stable lung nodules 3. Elevated pretreatment CEA.  4. Mitral regurgitation. 5. Hypertension. 6. History of rheumatic fever. 7. Pain and swelling at the right arm on 01/18/2013.  Doppler confirmed a right upper extremity deep vein thrombosis.   Daily Lovenox started on 01/18/2013.  8. PICC and Lovenox discontinued after an office visit 05/21/2013.  9. Acute herpes zoster affecting right thoracic dermatomes September 2014. He completed a course of valacyclovir. 10. Pain related to herpes zoster. Resolved. 11. Delayed nausea following cycle 1 CAPOX. Aloxi added to the premedication regimen beginning with cycle 2. 12. Diarrhea following cycle 2 CAPOX. 13. History of hand/foot syndrome secondary to Xeloda. 14. Ileostomy takedown procedure 06/28/2013. 15. Enterococcus bacteremia, probable endocarditis 07/05/2013.  16. PICC line placement for IV antibiotics 07/11/2013. 17. C. difficile colitis March 2015 18. Mild macrocytic anemia-likely related to chemotherapy and radiation    Disposition:  Mr. Isabell remains in clinical remission from rectal cancer. He will undergo a surveillance colonoscopy this summer. The rash at the lower back may be a "yeast rash ". He will try ketoconazole cream that he has on hand. If this does not help he will  follow-up with Dr. Livia Snellen.  The mild macrocytic anemia is likely related to the course of chemotherapy and radiation several  years ago. We will check a CBC when he returns in 6 months.  [name], MD  07/18/2016  11:38 AM

## 2016-07-19 ENCOUNTER — Telehealth: Payer: Self-pay

## 2016-07-19 LAB — CEA: CEA: 1.4 ng/mL (ref 0.0–4.7)

## 2016-07-19 NOTE — Telephone Encounter (Signed)
Called and informed pt of normal cea result. Pt verbalized understanding and denies any questions or concerns at this time 

## 2016-07-19 NOTE — Telephone Encounter (Signed)
-----   Message from Owens Shark, NP sent at 07/19/2016  8:59 AM EST ----- Please let him know CEA is normal.

## 2016-08-10 ENCOUNTER — Encounter: Payer: Self-pay | Admitting: Family Medicine

## 2016-08-10 ENCOUNTER — Ambulatory Visit (INDEPENDENT_AMBULATORY_CARE_PROVIDER_SITE_OTHER): Payer: PPO | Admitting: Family Medicine

## 2016-08-10 VITALS — BP 112/62 | HR 85 | Temp 97.4°F | Ht 68.5 in | Wt 181.0 lb

## 2016-08-10 DIAGNOSIS — B354 Tinea corporis: Secondary | ICD-10-CM

## 2016-08-10 MED ORDER — ECONAZOLE NITRATE 1 % EX CREA: TOPICAL_CREAM | Freq: Two times a day (BID) | CUTANEOUS | 0 refills | Status: DC

## 2016-08-10 NOTE — Progress Notes (Signed)
Subjective:  Patient ID: Tyler Diaz, male    DOB: [birth year]  Age: 71 y.o. MRN: [id number]  CC: Rash (pt here today c/o rash on lower back on the right side x 6 months. It itches some but not too much.)   HPI Tyler Diaz presents for Several months of a minimally pruritic discoloration of the skin. It is at the midline of the lower back and has been growing slowly. It started off as a tiny little place but has gradually spread out from there. His wife keeps telling him to get it checked. Other than that it doesn't really bother him.  History Tyler Diaz has a past medical history of Anemia; Arthritis; Blood clot in vein; C. difficile colitis (08/19/2013); Colon cancer ([place]) (07/18/12 bx); GERD (gastroesophageal reflux disease); Hearing loss; Heart murmur; Hematochezia; Herpes zoster - R flank - with severe pain (03/11/2013); History of shingles; Hyperlipidemia; Hypertension; Hypothyroidism (as child); Ileostomy - diverting loop - s/p takedown 06/28/2013 (01/14/2013); Mitral regurgitation due to partially flail posterior mitral valve leaflet; Nocturia; Numbness; Peripheral vascular disease ([place]); Radiation; Rectal cancer ([place]); Rheumatic fever (as child); Sinusitis; Tinnitus; and Umbilical hernia, incarcerated - s/p primary repair July 2014 (01/03/2013).   He has a past surgical history that includes Carpal tunnel release (Right, 2002); Tonsillectomy; Laparoscopic low anterior resection (N/A, 12/27/2012); Laparoscopic low anterior rescection with coloanal anastomosis (N/A, 12/27/2012); Ileostomy (N/A, 12/27/2012); Umbilical hernia repair (12/27/2012); Inguinal hernia repair (Left); Ileostomy closure (N/A, 06/28/2013); TEE without cardioversion (N/A, 07/10/2013); TEE without cardioversion (N/A, 08/21/2013); Inguinal hernia repair (Bilateral, 08/13/2015); and Insertion of mesh (Bilateral, 08/13/2015).   His family history includes Cancer in his mother and sister.He reports that he quit smoking about 44 years ago. His  smoking use included Cigarettes. He has a 30.00 pack-year smoking history. He has never used smokeless tobacco. He reports that he does not drink alcohol or use drugs.  Current Outpatient Prescriptions on File Prior to Visit  Medication Sig Dispense Refill  . acetaminophen (TYLENOL) 500 MG tablet Take 500 mg by mouth every 6 (six) hours as needed for headache.    . chlorthalidone (HYGROTON) 25 MG tablet Take 0.5 tablets (12.5 mg total) by mouth every morning. 45 tablet 3  . ketoconazole (NIZORAL) 2 % cream Apply 1 application topically 2 (two) times daily. 60 g 5  . lisinopril (PRINIVIL,ZESTRIL) 40 MG tablet Take 1 tablet by mouth daily.  3  . Multiple Vitamin (MULTIVITAMIN WITH MINERALS) TABS Take 1 tablet by mouth every morning.     Marland Kitchen oxymetazoline (AFRIN) 0.05 % nasal spray Place 1 spray into both nostrils 2 (two) times daily as needed for congestion.    . pravastatin (PRAVACHOL) 20 MG tablet Take 20 mg by mouth daily.     . Probiotic Product (DIGESTIVE ADVANTAGE PO) Take 1 tablet by mouth daily.     No current facility-administered medications on file prior to visit.     ROS Review of Systems Noncontributory Objective:  BP 112/62   Pulse 85   Temp 97.4 F (36.3 C) (Oral)   Ht 5' 8.5" (1.74 m)   Wt 181 lb (82.1 kg)   BMI 27.12 kg/m   Physical Exam Patient is alert and oriented 3 No acute distress There is a tan to light brown variegated discoloration over the small of the back. This crosses the midline. It has a vague border. It has some small papules. There are some papules near where it started that are arranged in a bit of a cervical.  There is minimal fine scale. Assessment & Plan:   [name] was seen today for rash.  Diagnoses and all orders for this visit:  Tinea corporis due to trichophyton  Other orders -     econazole nitrate 1 % cream; Apply topically 2 (two) times daily. To affected area   I am having Tyler Diaz start on econazole nitrate. I am also having him  maintain his multivitamin with minerals, oxymetazoline, acetaminophen, pravastatin, lisinopril, Probiotic Product (DIGESTIVE ADVANTAGE PO), ketoconazole, and chlorthalidone.  Meds ordered this encounter  Medications  . econazole nitrate 1 % cream    Sig: Apply topically 2 (two) times daily. To affected area    Dispense:  85 g    Refill:  0     Follow-up: Return if symptoms worsen or fail to improve.  Claretta Fraise, M.D.

## 2016-10-24 ENCOUNTER — Ambulatory Visit: Payer: PPO | Admitting: Family Medicine

## 2016-11-11 ENCOUNTER — Ambulatory Visit (INDEPENDENT_AMBULATORY_CARE_PROVIDER_SITE_OTHER): Payer: PPO | Admitting: *Deleted

## 2016-11-11 VITALS — BP 116/67 | HR 65 | Ht 70.0 in | Wt 177.0 lb

## 2016-11-11 DIAGNOSIS — Z0000 Encounter for general adult medical examination without abnormal findings: Secondary | ICD-10-CM | POA: Diagnosis not present

## 2016-11-11 NOTE — Patient Instructions (Addendum)
  Tyler Diaz ,  Thank you for taking time to come for your Medicare Wellness Visit. I appreciate your ongoing commitment to your health goals. Please review the following plan we discussed and let me know if I can assist you in the future.   These are the goals we discussed: Goals    . Exercise 150 minutes per week (moderate activity)          Walking the hill helps some, but may not be enough.    . Increase lean proteins          I recommend reducing fried foods and sweets and substituting with lean proteins such as low or no fat dairy such as greek yogurt and chicken fish and [place] - prepared without frying!       This is a list of the screening recommended for you and due dates:  Health Maintenance  Topic Date Due  .  Hepatitis C: One time screening is recommended by Center for Disease Control  (CDC) for  adults born from [birth year] through 1967.   09-22-1945  . Tetanus Vaccine  09/11/1962  . Flu Shot  01/04/2017  . Colon Cancer Screening  12/05/2023  . Pneumonia vaccines  Completed   Check with the insurance company on the price of Shingrix (shingles vaccine) and Boostrix (Tdap) . Keep f/u with Dr Livia Snellen Jan 20, 2017. Review Advance Directives with wife and bring a signed/notarized copy to our office.

## 2016-11-11 NOTE — Progress Notes (Signed)
Subjective:   Tyler Diaz is a 71 y.o. male who presents for a subsequent Medicare Annual Wellness Visit. Tyler Diaz is retired from [employer] and lives at home with his wife. They have 2 adult sons and 1 granddaughter. He is very active at home and raises show chickens. He still cuts firewood and maintains his yard.   Review of Systems  Cardiac Risk Factors include: sedentary lifestyle;male gender;advanced age (>41men, >39 women);hypertension  GI: Some episodes of alternating constipation and diarrhea. H/o colorectal cancer with surgical removal and radiation. Sees GI regularly and manages bowels with stools softeners and imodium.   States that health is the same as last year.   Other systems negative.   Objective:    Today's Vitals   11/11/16 0842  BP: 116/67  Pulse: 65  Weight: 177 lb (80.3 kg)  Height: 5\' 10"  (1.778 m)   Body mass index is 25.4 kg/m.  Current Medications (verified) Outpatient Encounter Prescriptions as of 11/11/2016  Medication Sig  . acetaminophen (TYLENOL) 500 MG tablet Take 500 mg by mouth every 6 (six) hours as needed for headache.  . chlorthalidone (HYGROTON) 25 MG tablet Take 0.5 tablets (12.5 mg total) by mouth every morning.  Marland Kitchen econazole nitrate 1 % cream Apply topically 2 (two) times daily. To affected area  . ketoconazole (NIZORAL) 2 % cream Apply 1 application topically 2 (two) times daily.  Marland Kitchen lisinopril (PRINIVIL,ZESTRIL) 40 MG tablet Take 1 tablet by mouth daily.  . Multiple Vitamin (MULTIVITAMIN WITH MINERALS) TABS Take 1 tablet by mouth every morning.   Marland Kitchen oxymetazoline (AFRIN) 0.05 % nasal spray Place 1 spray into both nostrils 2 (two) times daily as needed for congestion.  . pravastatin (PRAVACHOL) 20 MG tablet Take 20 mg by mouth daily.   . Probiotic Product (DIGESTIVE ADVANTAGE PO) Take 1 tablet by mouth daily.   No facility-administered encounter medications on file as of 11/11/2016.     Allergies (verified) Shrimp [shellfish  allergy]   History: Past Medical History:  Diagnosis Date  . Anemia    hx of  . Arthritis    hands/knees  . Blood clot in vein    RT ARM WITH PICC LINE   . C. difficile colitis 08/19/2013  . Colon cancer ([place]) 07/18/12 bx   rectum=Invasive adenocarcinoma w/ extracellular mucin,polyp=benign  . GERD (gastroesophageal reflux disease)    OCCASIONAL  . Hearing loss    partial greater on left  . Heart murmur   . Hematochezia    recent  . Herpes zoster - R flank - with severe pain 03/11/2013  . History of shingles   . Hyperlipidemia   . Hypertension   . Hypothyroidism as child   hx of  . Ileostomy - diverting loop - s/p takedown 06/28/2013 01/14/2013  . Mitral regurgitation due to partially flail posterior mitral valve leaflet   . Nocturia   . Numbness    TOES / FINGERS DUE TO CHEMO  . Peripheral vascular disease ([facility])    neuropathy due to cancer treatment (fingers and toes)  . Radiation    FOR COLON CANCER  . Rectal cancer ([place])   . Rheumatic fever as child   hx  . Sinusitis    seasonal  . Tinnitus    left ear  . Umbilical hernia, incarcerated - s/p primary repair July 2014 01/03/2013   Past Surgical History:  Procedure Laterality Date  . CARPAL TUNNEL RELEASE Right 2002  . ILEOSTOMY N/A 12/27/2012   Procedure:  DIVERTING LOOP ILEOSTOMY;  Surgeon: Adin Hector, MD;  Location: [facility];  Service: General;  Laterality: N/A;  . ILEOSTOMY CLOSURE N/A 06/28/2013   Procedure: loop ILEOSTOMY TAKEDOWN examination of anesthesia ;  Surgeon: Adin Hector, MD;  Location: [facility];  Service: General;  Laterality: N/A;  . INGUINAL HERNIA REPAIR Left   . INGUINAL HERNIA REPAIR Bilateral 08/13/2015   Procedure: LAPAROSCOPIC BILATERAL INGUINAL HERNIA REPAIR WITH MESH;  Surgeon: Michael Boston, MD;  Location: [facility];  Service: General;  Laterality: Bilateral;  . INSERTION OF MESH Bilateral 08/13/2015   Procedure: INSERTION OF MESH;  Surgeon: Michael Boston, MD;  Location: [facility];  Service: General;   Laterality: Bilateral;  . LAPAROSCOPIC LOW ANTERIOR RESCECTION WITH COLOANAL ANASTOMOSIS N/A 12/27/2012   Procedure: LAPAROSCOPIC LOW ANTERIOR RESCECTION WITH COLOANAL ANASTOMOSIS;  Surgeon: Adin Hector, MD;  Location: [facility];  Service: General;  Laterality: N/A;  . LAPAROSCOPIC LOW ANTERIOR RESECTION N/A 12/27/2012   Procedure: LAPAROSCOPIC LOW ANTERIOR RESECTION, COLO-ANAL ANASTOMOSIS, DIVERTING LOOP ILEOSTOMY,SPLENIC FLEXURE MOBILIZATION, PRIMARY INCARCERATED UMBILICAL HERNIA REPAIR;  Surgeon: Adin Hector, MD;  Location: [facility];  Service: General;  Laterality: N/A;  . TEE WITHOUT CARDIOVERSION N/A 07/10/2013   Procedure: TRANSESOPHAGEAL ECHOCARDIOGRAM (TEE);  Surgeon: Candee Furbish, MD;  [facility] ENDOSCOPY;  Service: Cardiovascular;  Laterality: N/A;  . TEE WITHOUT CARDIOVERSION N/A 08/21/2013   Procedure: TRANSESOPHAGEAL ECHOCARDIOGRAM (TEE);  Surgeon: Sanda Klein, MD;  Location: [facility] ENDOSCOPY;  Service: Cardiovascular;  Laterality: N/A;  . TONSILLECTOMY     as child  . UMBILICAL HERNIA REPAIR  12/27/2012   Procedure: PRIMARY REPAIR INCARCERATED UMBILICAL HERNIA;  Surgeon: Adin Hector, MD;  Location: [facility];  Service: General;;   Family History  Problem Relation Age of Onset  . Cancer Mother        abdominal ca ?ovarian?  . Heart disease Father   . Cancer Sister        liver cancer  . Heart disease Brother   . COPD Brother   . Healthy Son   . Healthy Son    Social History   Occupational History  . Retired     Chiropractor   Social History Main Topics  . Smoking status: Former Smoker    Packs/day: 1.50    Years: 20.00    Types: Cigarettes    Quit date: 07/27/1972  . Smokeless tobacco: Never Used     Comment: quit smoking 35 years ago  . Alcohol use No  . Drug use: No  . Sexual activity: Yes   Tobacco Counseling Counseling given: Not Answered   Activities of Daily Living In your present state of health, do you have any difficulty performing  the following activities: 11/11/2016  Hearing? Y  Vision? N  Difficulty concentrating or making decisions? Y  Walking or climbing stairs? N  Dressing or bathing? N  Doing errands, shopping? N  Preparing Food and eating ? N  Using the Toilet? N  In the past six months, have you accidently leaked urine? Y  Do you have problems with loss of bowel control? Y  Managing your Medications? N  Managing your Finances? N  Housekeeping or managing your Housekeeping? N  Some recent data might be hidden    Immunizations and Health Maintenance Immunization History  Administered Date(s) Administered  . Influenza,inj,Quad PF,36+ Mos 05/21/2013, 03/13/2014, 03/10/2015, 03/14/2016  . Pneumococcal Polysaccharide-23 05/21/2013   Health Maintenance Due  Topic Date Due  . Hepatitis C Screening  08-25-1945  . TETANUS/TDAP  09/11/1962    Patient Care Team: Claretta Fraise, MD as PCP - General (Family Medicine) Michael Boston, MD as Consulting Physician (Colon and Rectal Surgery) Wilford Corner, MD as Consulting Physician (Gastroenterology) Gatha Mayer, MD as Consulting Physician (Radiation Oncology) Carola Frost, RN as Registered Nurse (Oncology) [name], MD as Consulting Physician (Oncology) Carlyle Basques, MD as Consulting Physician (Infectious Diseases) Wilford Corner, MD as Consulting Physician (Gastroenterology) Jacolyn Reedy, MD as Consulting Physician (Cardiology) Harlen Labs, MD as Referring Physician (Optometry)  No hospitalizations, ER visits, or surgeries within the past year.    Assessment:   This is a routine wellness examination for Tyler Diaz.  Hearing/Vision screen No deficits noted during visit. Last eye exam was last year.   Dietary issues and exercise activities discussed: Current Exercise Habits: Home exercise routine (patient walks regularly throughout the day at home. He was exercising regularly at home but hasn't recently. ), Type of exercise:  walking, Time (Minutes): 30, Frequency (Times/Week): 7, Weekly Exercise (Minutes/Week): 210, Intensity: Mild, Exercise limited by: None identified  Diet: Eats two home prepared meals a day  Goals    . Exercise 150 minutes per week (moderate activity)          Walking the hill helps some, but may not be enough.    . Increase lean proteins          I recommend reducing fried foods and sweets and substituting with lean proteins such as low or no fat dairy such as greek yogurt and chicken fish and [place] - prepared without frying!      Depression Screen PHQ 2/9 Scores 11/11/2016 08/10/2016 04/25/2016 10/22/2015  PHQ - 2 Score 0 0 0 0    Fall Risk Fall Risk  11/11/2016 08/10/2016 04/25/2016 10/22/2015 01/23/2015  Falls in the past year? No No No No No  He has 5 steps into his house. There isn't a rail.   Cognitive Function: MMSE - Mini Mental State Exam 11/11/2016  Orientation to time 5  Orientation to Place 5  Registration 3  Attention/ Calculation 4  Recall 1  Language- name 2 objects 2  Language- repeat 1  Language- follow 3 step command 3  Language- read & follow direction 1  Write a sentence 1  Copy design 1  Total score 27    No concerns with today's exam. Would screen yearly.    Screening Tests Health Maintenance  Topic Date Due  . Hepatitis C Screening  21-Sep-1945  . TETANUS/TDAP  09/11/1962  . INFLUENZA VACCINE  01/04/2017  . COLONOSCOPY  12/05/2023  . PNA vac Low Risk Adult  Completed        Plan:  -Patient will check with his insurance company about the cost of Shingrix and Boostrix. Advised that he could schedule a nurse visit to receive those.  -Advanced Directives given. He will review with family and bring our office a signed/notarized copy. -keep f/u with Dr Livia Snellen on 01/20/17 - may want to put up a rail on outside steps  I have personally reviewed and noted the following in the patient's chart:   . Medical and social history . Use of alcohol, tobacco or  illicit drugs  . Current medications and supplements . Functional ability and status . Nutritional status . Physical activity . Advanced directives . List of other physicians . Hospitalizations, surgeries, and ER visits in previous 12 months . Vitals . Screenings to include cognitive, depression, and falls .  Referrals and appointments  In addition, I have reviewed and discussed with patient certain preventive protocols, quality metrics, and best practice recommendations. A written personalized care plan for preventive services as well as general preventive health recommendations were provided to patient.     Chong Sicilian, RN   11/11/2016    I have reviewed and agree with the above AWV documentation.  Claretta Fraise, M.D.

## 2016-11-21 DIAGNOSIS — Z86718 Personal history of other venous thrombosis and embolism: Secondary | ICD-10-CM | POA: Diagnosis not present

## 2016-11-21 DIAGNOSIS — I119 Hypertensive heart disease without heart failure: Secondary | ICD-10-CM | POA: Diagnosis not present

## 2016-11-21 DIAGNOSIS — I340 Nonrheumatic mitral (valve) insufficiency: Secondary | ICD-10-CM | POA: Diagnosis not present

## 2016-11-21 DIAGNOSIS — Z85048 Personal history of other malignant neoplasm of rectum, rectosigmoid junction, and anus: Secondary | ICD-10-CM | POA: Diagnosis not present

## 2016-11-21 DIAGNOSIS — E785 Hyperlipidemia, unspecified: Secondary | ICD-10-CM | POA: Diagnosis not present

## 2016-11-21 DIAGNOSIS — I10 Essential (primary) hypertension: Secondary | ICD-10-CM | POA: Diagnosis not present

## 2017-01-11 DIAGNOSIS — Z85048 Personal history of other malignant neoplasm of rectum, rectosigmoid junction, and anus: Secondary | ICD-10-CM | POA: Diagnosis not present

## 2017-01-16 ENCOUNTER — Other Ambulatory Visit (HOSPITAL_BASED_OUTPATIENT_CLINIC_OR_DEPARTMENT_OTHER): Payer: PPO

## 2017-01-16 ENCOUNTER — Ambulatory Visit (HOSPITAL_BASED_OUTPATIENT_CLINIC_OR_DEPARTMENT_OTHER): Payer: PPO | Admitting: Nurse Practitioner

## 2017-01-16 ENCOUNTER — Telehealth: Payer: Self-pay | Admitting: Oncology

## 2017-01-16 VITALS — BP 120/68 | HR 70 | Temp 98.2°F | Resp 18 | Ht 70.0 in | Wt 177.8 lb

## 2017-01-16 DIAGNOSIS — Z86718 Personal history of other venous thrombosis and embolism: Secondary | ICD-10-CM

## 2017-01-16 DIAGNOSIS — Z7901 Long term (current) use of anticoagulants: Secondary | ICD-10-CM

## 2017-01-16 DIAGNOSIS — Z85048 Personal history of other malignant neoplasm of rectum, rectosigmoid junction, and anus: Secondary | ICD-10-CM | POA: Diagnosis not present

## 2017-01-16 DIAGNOSIS — C20 Malignant neoplasm of rectum: Secondary | ICD-10-CM

## 2017-01-16 DIAGNOSIS — I10 Essential (primary) hypertension: Secondary | ICD-10-CM | POA: Diagnosis not present

## 2017-01-16 DIAGNOSIS — D539 Nutritional anemia, unspecified: Secondary | ICD-10-CM

## 2017-01-16 LAB — CBC WITH DIFFERENTIAL/PLATELET
BASO%: 1.9 % (ref 0.0–2.0)
BASOS ABS: 0.1 10*3/uL (ref 0.0–0.1)
EOS%: 6.6 % (ref 0.0–7.0)
Eosinophils Absolute: 0.5 10*3/uL (ref 0.0–0.5)
HEMATOCRIT: 37.5 % — AB (ref 38.4–49.9)
HEMOGLOBIN: 13 g/dL (ref 13.0–17.1)
LYMPH#: 1.1 10*3/uL (ref 0.9–3.3)
LYMPH%: 15.7 % (ref 14.0–49.0)
MCH: 34.6 pg — AB (ref 27.2–33.4)
MCHC: 34.6 g/dL (ref 32.0–36.0)
MCV: 100.1 fL — ABNORMAL HIGH (ref 79.3–98.0)
MONO#: 0.8 10*3/uL (ref 0.1–0.9)
MONO%: 11.6 % (ref 0.0–14.0)
NEUT#: 4.7 10*3/uL (ref 1.5–6.5)
NEUT%: 64.2 % (ref 39.0–75.0)
PLATELETS: 161 10*3/uL (ref 140–400)
RBC: 3.75 10*6/uL — ABNORMAL LOW (ref 4.20–5.82)
RDW: 14.2 % (ref 11.0–14.6)
WBC: 7.2 10*3/uL (ref 4.0–10.3)

## 2017-01-16 LAB — CEA (IN HOUSE-CHCC): CEA (CHCC-In House): <1 ng/mL (ref 0.00–5.00)

## 2017-01-16 NOTE — Telephone Encounter (Signed)
Gave patient avs and calendar for upcoming dates.

## 2017-01-16 NOTE — Progress Notes (Signed)
[place] OFFICE PROGRESS NOTE   Diagnosis:  Rectal cancer  INTERVAL HISTORY:   Tyler Diaz returns as scheduled. No change in bowel habits. No bloody or black stools. He occasionally notes a small amount of blood on the toilet tissue after having multiple loose stools. He reports a routine colonoscopy is scheduled on 02/23/2017. He describes his appetite as great.  Objective:  Vital signs in last 24 hours:  Blood pressure 120/68, pulse 70, temperature 98.2 F (36.8 C), temperature source Oral, resp. rate 18, height 5' 10"  (1.778 m), weight 177 lb 12.8 oz (80.6 kg), SpO2 98 %.    HEENT: Neck without mass. Lymphatics: No palpable cervical, supra clavicular, axillary or inguinal lymph nodes. Resp: Lungs clear bilaterally. Cardio: Regular rate and rhythm. 2/6 systolic murmur. GI: No hepatomegaly. No mass. Vascular: No leg edema.   Lab Results:  Lab Results  Component Value Date   WBC 7.2 01/16/2017   HGB 13.0 01/16/2017   HCT 37.5 (L) 01/16/2017   MCV 100.1 (H) 01/16/2017   PLT 161 01/16/2017   NEUTROABS 4.7 01/16/2017    Imaging:  No results found.  Medications: I have reviewed the patient's current medications.  Assessment/Plan: 1. Stage III rectal cancer, presenting with a locally advanced rectal tumor. Diagnosed February 2014  CT/MRI evidence of perirectal lymphadenopathy.   Status post neoadjuvant Xeloda and radiation at the [facility].   Status post low anterior resection and coloanal anastomosis 12/27/2012 with pathology confirming a ypT3, ypN1b tumor, microsatellite stable.   Initiation of adjuvant CAPOX chemotherapy 01/30/2013.   Cycle 2 adjuvant CAPOX 02/20/2013.   Cycle 3 adjuvant CAPOX 03/13/2013.   Cycle 4 adjuvant CAPOX 04/03/2013 (oxaliplatin dose reduced with cycle 4 due to neuropathy, Xeloda dose reduced with cycle 4 due to hand-foot syndrome).   Cycle 5 adjuvant CAPOX 04/24/2013.  Surveillance colonoscopy  12/04/2013. Perianal erythema and tight anorectal sphincter. One 4 mm polyp in the sigmoid colon completely resected. Diverticulosis in the sigmoid colon. Repeat colonoscopy in 3 years for surveillance.  CTs 07/15/2014 with stable lung nodules, no evidence of metastatic disease  CTs 07/20/2015-no evidence of metastatic disease 2. Small bilateral lung nodules and a mildly enlarged gastrohepatic node on the initial staging CT scans 08/02/2012.  CT chest on 01/23/2013 with stable bilateral pulmonary nodules.  CT chest 02/09/2016with stable lung nodules  CT chest 07/20/2015-stable lung nodules 3. Elevated pretreatment CEA.  4. Mitral regurgitation. 5. Hypertension. 6. History of rheumatic fever. 7. Pain and swelling at the right arm on 01/18/2013.  Doppler confirmed a right upper extremity deep vein thrombosis.   Daily Lovenox started on 01/18/2013.  8. PICC and Lovenox discontinued after an office visit 05/21/2013.  9. Acute herpes zoster affecting right thoracic dermatomes September 2014. He completed a course of valacyclovir. 10. Pain related to herpes zoster. Resolved. 11. Delayed nausea following cycle 1 CAPOX. Aloxi added to the premedication regimen beginning with cycle 2. 12. Diarrhea following cycle 2 CAPOX. 13. History of hand/foot syndrome secondary to Xeloda. 14. Ileostomy takedown procedure 06/28/2013. 15. Enterococcus bacteremia, probable endocarditis 07/05/2013.  16. PICC line placement for IV antibiotics 07/11/2013. 17. C. difficile colitis March 2015 18. Mild macrocytic anemia-likely related to chemotherapy and radiation; stable 01/16/2017.    Disposition: Tyler Diaz remains in clinical remission from rectal cancer. We will follow-up on the CEA from today. He is scheduled for a surveillance colonoscopy next month. He will return for a follow-up visit and CEA in 6 months. He will contact the  office in the interim with any problems.      Ned Card  ANP/GNP-BC   01/16/2017  11:13 AM

## 2017-01-17 ENCOUNTER — Telehealth: Payer: Self-pay | Admitting: Emergency Medicine

## 2017-01-17 NOTE — Telephone Encounter (Signed)
Called patient to inform him per Lattie Haw, NP that CEA was normal and to follow up as scheduled. Patients wife verbalized understanding.

## 2017-01-20 ENCOUNTER — Encounter: Payer: Self-pay | Admitting: Family Medicine

## 2017-01-20 ENCOUNTER — Ambulatory Visit (INDEPENDENT_AMBULATORY_CARE_PROVIDER_SITE_OTHER): Payer: PPO | Admitting: Family Medicine

## 2017-01-20 VITALS — BP 135/80 | HR 80 | Temp 97.6°F | Ht 70.0 in | Wt 180.0 lb

## 2017-01-20 DIAGNOSIS — N401 Enlarged prostate with lower urinary tract symptoms: Secondary | ICD-10-CM | POA: Diagnosis not present

## 2017-01-20 DIAGNOSIS — E782 Mixed hyperlipidemia: Secondary | ICD-10-CM

## 2017-01-20 DIAGNOSIS — R351 Nocturia: Secondary | ICD-10-CM

## 2017-01-20 DIAGNOSIS — I10 Essential (primary) hypertension: Secondary | ICD-10-CM | POA: Diagnosis not present

## 2017-01-20 DIAGNOSIS — I340 Nonrheumatic mitral (valve) insufficiency: Secondary | ICD-10-CM | POA: Diagnosis not present

## 2017-01-20 MED ORDER — CHLORTHALIDONE 25 MG PO TABS: 12.5000 mg | ORAL_TABLET | ORAL | 3 refills | Status: DC

## 2017-01-20 MED ORDER — KETOCONAZOLE 2 % EX CREA: 1.0000 "application " | TOPICAL_CREAM | Freq: Two times a day (BID) | CUTANEOUS | 5 refills | Status: DC

## 2017-01-20 NOTE — Progress Notes (Signed)
Subjective:  Patient ID: Tyler Diaz,  male    DOB: 1945/11/02  Age: 71 y.o.    CC: Follow-up (pt here today for routine follow up of his chronic medical condtions, no concerns voiced.)   HPI Tyler Diaz presents for  follow-up of hypertension. Patient has no history of headache chest pain or shortness of breath or recent cough. Patient also denies symptoms of TIA such as numbness weakness lateralizing. Patient checks  blood pressure at home. Recent readings have been good Patient denies side effects from medication. States taking it regularly.  Patient also  in for follow-up of elevated cholesterol. Doing well without complaints on current medication. Denies side effects of statin including myalgia and arthralgia and nausea. Also in today for liver function testing. Currently no chest pain, shortness of breath or other cardiovascular related symptoms noted.    History Tyler Diaz has a past medical history of Anemia; Arthritis; Blood clot in vein; C. difficile colitis (08/19/2013); Colon cancer ([place]) (07/18/12 bx); GERD (gastroesophageal reflux disease); Hearing loss; Heart murmur; Hematochezia; Herpes zoster - R flank - with severe pain (03/11/2013); History of shingles; Hyperlipidemia; Hypertension; Hypothyroidism (as child); Ileostomy - diverting loop - s/p takedown 06/28/2013 (01/14/2013); Mitral regurgitation due to partially flail posterior mitral valve leaflet; Nocturia; Numbness; Peripheral vascular disease ([place]); Radiation; Rectal cancer ([place]); Rheumatic fever (as child); Sinusitis; Tinnitus; and Umbilical hernia, incarcerated - s/p primary repair July 2014 (01/03/2013).   He has a past surgical history that includes Carpal tunnel release (Right, 2002); Tonsillectomy; Laparoscopic low anterior resection (N/A, 12/27/2012); Laparoscopic low anterior rescection with coloanal anastomosis (N/A, 12/27/2012); Ileostomy (N/A, 12/27/2012); Umbilical hernia repair (12/27/2012); Inguinal hernia repair  (Left); Ileostomy closure (N/A, 06/28/2013); TEE without cardioversion (N/A, 07/10/2013); TEE without cardioversion (N/A, 08/21/2013); Inguinal hernia repair (Bilateral, 08/13/2015); and Insertion of mesh (Bilateral, 08/13/2015).   His family history includes COPD in his brother; Cancer in his mother and sister; Healthy in his son and son; Heart disease in his brother and father.He reports that he quit smoking about 44 years ago. His smoking use included Cigarettes. He has a 30.00 pack-year smoking history. He has never used smokeless tobacco. He reports that he does not drink alcohol or use drugs.  Current Outpatient Prescriptions on File Prior to Visit  Medication Sig Dispense Refill  . acetaminophen (TYLENOL) 500 MG tablet Take 500 mg by mouth every 6 (six) hours as needed for headache.    . chlorthalidone (HYGROTON) 25 MG tablet Take 0.5 tablets (12.5 mg total) by mouth every morning. 45 tablet 3  . ketoconazole (NIZORAL) 2 % cream Apply 1 application topically 2 (two) times daily. 60 g 5  . lisinopril (PRINIVIL,ZESTRIL) 40 MG tablet Take 1 tablet by mouth daily.  3  . Multiple Vitamin (MULTIVITAMIN WITH MINERALS) TABS Take 1 tablet by mouth every morning.     Marland Kitchen oxymetazoline (AFRIN) 0.05 % nasal spray Place 1 spray into both nostrils 2 (two) times daily as needed for congestion.    . pravastatin (PRAVACHOL) 20 MG tablet Take 20 mg by mouth daily.     . Probiotic Product (DIGESTIVE ADVANTAGE PO) Take 1 tablet by mouth daily.     No current facility-administered medications on file prior to visit.     ROS Review of Systems  Constitutional: Negative for chills, diaphoresis, fever and unexpected weight change.  HENT: Negative for congestion, hearing loss, rhinorrhea and sore throat.   Eyes: Negative for visual disturbance.  Respiratory: Negative for cough and shortness of breath.  Cardiovascular: Negative for chest pain.  Gastrointestinal: Negative for abdominal pain, constipation and diarrhea.    Genitourinary: Negative for dysuria and flank pain.  Musculoskeletal: Negative for arthralgias and joint swelling.  Skin: Negative for rash.  Neurological: Negative for dizziness and headaches.  Psychiatric/Behavioral: Negative for dysphoric mood and sleep disturbance.    Objective:  BP 135/80   Pulse 80   Temp 97.6 F (36.4 C) (Oral)   Ht 5\' 10"  (1.778 m)   Wt 180 lb (81.6 kg)   BMI 25.83 kg/m   BP Readings from Last 3 Encounters:  01/20/17 135/80  01/16/17 120/68  11/11/16 116/67    Wt Readings from Last 3 Encounters:  01/20/17 180 lb (81.6 kg)  01/16/17 177 lb 12.8 oz (80.6 kg)  11/11/16 177 lb (80.3 kg)     Physical Exam  Constitutional: He is oriented to person, place, and time. He appears well-developed and well-nourished. No distress.  HENT:  Head: Normocephalic and atraumatic.  Right Ear: External ear normal.  Left Ear: External ear normal.  Nose: Nose normal.  Mouth/Throat: Oropharynx is clear and moist.  Eyes: Pupils are equal, round, and reactive to light. Conjunctivae and EOM are normal.  Neck: Normal range of motion. Neck supple. No thyromegaly present.  Cardiovascular: Normal rate and regular rhythm.   Murmur heard. Pulmonary/Chest: Effort normal and breath sounds normal. No respiratory distress. He has no wheezes. He has no rales.  Abdominal: Soft. Bowel sounds are normal. He exhibits no distension. There is no tenderness.  Lymphadenopathy:    He has no cervical adenopathy.  Neurological: He is alert and oriented to person, place, and time. He has normal reflexes.  Skin: Skin is warm and dry.  Psychiatric: He has a normal mood and affect. His behavior is normal. Judgment and thought content normal.      Assessment & Plan:   Tyler was seen today for follow-up.  Diagnoses and all orders for this visit:  Benign prostatic hyperplasia with nocturia  Essential hypertension  Mixed hyperlipidemia  Mitral valve insufficiency, unspecified  etiology   I have discontinued Tyler Diaz's econazole nitrate. I am also having him maintain his multivitamin with minerals, oxymetazoline, acetaminophen, pravastatin, lisinopril, Probiotic Product (DIGESTIVE ADVANTAGE PO), ketoconazole, and chlorthalidone.  No orders of the defined types were placed in this encounter.    Follow-up: No Follow-up on file.  Claretta Fraise, M.D.

## 2017-01-20 NOTE — Addendum Note (Signed)
Addended by: Marylin Crosby on: 01/20/2017 09:29 AM   Modules accepted: Orders

## 2017-02-20 DIAGNOSIS — H40033 Anatomical narrow angle, bilateral: Secondary | ICD-10-CM | POA: Diagnosis not present

## 2017-02-20 DIAGNOSIS — H2513 Age-related nuclear cataract, bilateral: Secondary | ICD-10-CM | POA: Diagnosis not present

## 2017-02-23 DIAGNOSIS — Z980 Intestinal bypass and anastomosis status: Secondary | ICD-10-CM | POA: Diagnosis not present

## 2017-02-23 DIAGNOSIS — Z85048 Personal history of other malignant neoplasm of rectum, rectosigmoid junction, and anus: Secondary | ICD-10-CM | POA: Diagnosis not present

## 2017-03-08 ENCOUNTER — Ambulatory Visit (INDEPENDENT_AMBULATORY_CARE_PROVIDER_SITE_OTHER): Payer: PPO

## 2017-03-08 DIAGNOSIS — Z23 Encounter for immunization: Secondary | ICD-10-CM | POA: Diagnosis not present

## 2017-05-22 DIAGNOSIS — I340 Nonrheumatic mitral (valve) insufficiency: Secondary | ICD-10-CM | POA: Diagnosis not present

## 2017-05-22 DIAGNOSIS — Z86718 Personal history of other venous thrombosis and embolism: Secondary | ICD-10-CM | POA: Diagnosis not present

## 2017-05-22 DIAGNOSIS — E7849 Other hyperlipidemia: Secondary | ICD-10-CM | POA: Diagnosis not present

## 2017-05-22 DIAGNOSIS — I119 Hypertensive heart disease without heart failure: Secondary | ICD-10-CM | POA: Diagnosis not present

## 2017-05-22 DIAGNOSIS — Z85048 Personal history of other malignant neoplasm of rectum, rectosigmoid junction, and anus: Secondary | ICD-10-CM | POA: Diagnosis not present

## 2017-06-21 ENCOUNTER — Encounter: Payer: Self-pay | Admitting: *Deleted

## 2017-07-24 ENCOUNTER — Inpatient Hospital Stay: Payer: PPO | Admitting: Oncology

## 2017-07-24 ENCOUNTER — Inpatient Hospital Stay: Payer: PPO | Attending: Oncology

## 2017-07-24 ENCOUNTER — Ambulatory Visit: Payer: PPO | Admitting: Family Medicine

## 2017-07-24 ENCOUNTER — Telehealth: Payer: Self-pay | Admitting: Emergency Medicine

## 2017-07-24 VITALS — BP 129/73 | HR 73 | Temp 98.0°F | Resp 18 | Ht 70.0 in | Wt 174.2 lb

## 2017-07-24 DIAGNOSIS — D539 Nutritional anemia, unspecified: Secondary | ICD-10-CM

## 2017-07-24 DIAGNOSIS — Z85038 Personal history of other malignant neoplasm of large intestine: Secondary | ICD-10-CM | POA: Insufficient documentation

## 2017-07-24 DIAGNOSIS — C20 Malignant neoplasm of rectum: Secondary | ICD-10-CM

## 2017-07-24 LAB — CBC WITH DIFFERENTIAL/PLATELET
Basophils Absolute: 0.1 10*3/uL (ref 0.0–0.1)
Basophils Relative: 1 %
EOS PCT: 4 %
Eosinophils Absolute: 0.3 10*3/uL (ref 0.0–0.5)
HEMATOCRIT: 37.5 % — AB (ref 38.4–49.9)
Hemoglobin: 12.6 g/dL — ABNORMAL LOW (ref 13.0–17.1)
Lymphocytes Relative: 15 %
Lymphs Abs: 1.1 10*3/uL (ref 0.9–3.3)
MCH: 34.3 pg — ABNORMAL HIGH (ref 27.2–33.4)
MCHC: 33.6 g/dL (ref 32.0–36.0)
MCV: 102.2 fL — AB (ref 79.3–98.0)
MONO ABS: 0.9 10*3/uL (ref 0.1–0.9)
MONOS PCT: 12 %
NEUTROS ABS: 5 10*3/uL (ref 1.5–6.5)
Neutrophils Relative %: 68 %
PLATELETS: 150 10*3/uL (ref 140–400)
RBC: 3.67 MIL/uL — ABNORMAL LOW (ref 4.20–5.82)
RDW: 13.8 % (ref 11.0–14.6)
WBC: 7.4 10*3/uL (ref 4.0–10.3)

## 2017-07-24 LAB — CEA (IN HOUSE-CHCC): CEA (CHCC-In House): <1 ng/mL (ref 0.00–5.00)

## 2017-07-24 NOTE — Progress Notes (Signed)
[place] OFFICE PROGRESS NOTE   Diagnosis: Rectal cancer  INTERVAL HISTORY:   Mr. Hornback returns as scheduled.  He feels well.  Good appetite and energy level.  He continues to have irregular bowel habits.  He is followed by Dr. Michail Sermon for surveillance colonoscopies.  Objective:  Vital signs in last 24 hours:  Blood pressure 129/73, pulse 73, temperature 98 F (36.7 C), temperature source Oral, resp. rate 18, height 5' 10" (1.778 m), weight 174 lb 3.2 oz (79 kg), SpO2 99 %.    HEENT: Neck without mass Lymphatics: No cervical, supraclavicular, axillary, or inguinal nodes Resp: Lungs clear bilaterally Cardio: Regular rate and rhythm GI: No hepatosplenomegaly, no mass, nontender Vascular: No leg edema     Lab Results:  Lab Results  Component Value Date   WBC 7.4 07/24/2017   HGB 12.6 (L) 07/24/2017   HCT 37.5 (L) 07/24/2017   MCV 102.2 (H) 07/24/2017   PLT 150 07/24/2017   NEUTROABS 5.0 07/24/2017    Lab Results  Component Value Date   CEA1 <1.00 07/24/2017     Medications: I have reviewed the patient's current medications.   Assessment/Plan: 1. Stage III rectal cancer, presenting with a locally advanced rectal tumor. Diagnosed February 2014  CT/MRI evidence of perirectal lymphadenopathy.   Status post neoadjuvant Xeloda and radiation at the [facility].   Status post low anterior resection and coloanal anastomosis 12/27/2012 with pathology confirming a ypT3, ypN1b tumor, microsatellite stable.   Initiation of adjuvant CAPOX chemotherapy 01/30/2013.   Cycle 2 adjuvant CAPOX 02/20/2013.   Cycle 3 adjuvant CAPOX 03/13/2013.   Cycle 4 adjuvant CAPOX 04/03/2013 (oxaliplatin dose reduced with cycle 4 due to neuropathy, Xeloda dose reduced with cycle 4 due to hand-foot syndrome).   Cycle 5 adjuvant CAPOX 04/24/2013.  Surveillance colonoscopy 12/04/2013. Perianal erythema and tight anorectal sphincter. One 4 mm polyp in the  sigmoid colon completely resected. Diverticulosis in the sigmoid colon. Repeat colonoscopy in 3 years for surveillance.  CTs 07/15/2014 with stable lung nodules, no evidence of metastatic disease  CTs 07/20/2015-no evidence of metastatic disease 2. Small bilateral lung nodules and a mildly enlarged gastrohepatic node on the initial staging CT scans 08/02/2012.  CT chest on 01/23/2013 with stable bilateral pulmonary nodules.  CT chest 02/09/2016with stable lung nodules  CT chest 07/20/2015-stable lung nodules 3. Elevated pretreatment CEA.  4. Mitral regurgitation. 5. Hypertension. 6. History of rheumatic fever. 7. Pain and swelling at the right arm on 01/18/2013.  Doppler confirmed a right upper extremity deep vein thrombosis.   Daily Lovenox started on 01/18/2013.  8. PICC and Lovenox discontinued after an office visit 05/21/2013.  9. Acute herpes zoster affecting right thoracic dermatomes September 2014. He completed a course of valacyclovir. 10. Pain related to herpes zoster. Resolved. 11. Delayed nausea following cycle 1 CAPOX. Aloxi added to the premedication regimen beginning with cycle 2. 12. Diarrhea following cycle 2 CAPOX. 13. History of hand/foot syndrome secondary to Xeloda. 14. Ileostomy takedown procedure 06/28/2013. 15. Enterococcus bacteremia, probable endocarditis 07/05/2013.  16. PICC line placement for IV antibiotics 07/11/2013. 17. C. difficile colitis March 2015 18. Mild macrocytic anemia-likely related to chemotherapy and radiation; stable   Disposition: Mr. Shropshire remains in clinical remission from colon cancer.  He is now 5 years out from diagnosis.  He has a good prognosis for a long-term disease-free survival.  He will continue surveillance colonoscopies with Dr. Michail Sermon.  He has chronic mild macrocytic anemia.  This may be related to  treatment of rectal cancer or early myelodysplasia.  This can be followed by his primary physician.  Mr. Spradley  will be discharged from the oncology clinic.  I am available to see him as needed in the future for evaluation of rectal cancer or the mild microcytic anemia.  15 minutes were spent with the patient today.  The majority of the time was used for counseling and coordination of care.  [name], MD  07/24/2017  5:26 PM

## 2017-07-24 NOTE — Telephone Encounter (Addendum)
Pt wife verbalized understanding of this note.   ----- Message from [name], MD sent at 07/24/2017  1:54 PM EST ----- Please call patient, cea is normal

## 2017-07-25 ENCOUNTER — Ambulatory Visit: Payer: PPO | Admitting: Family Medicine

## 2017-07-26 ENCOUNTER — Encounter: Payer: Self-pay | Admitting: Family Medicine

## 2017-07-26 ENCOUNTER — Ambulatory Visit (INDEPENDENT_AMBULATORY_CARE_PROVIDER_SITE_OTHER): Payer: PPO | Admitting: Family Medicine

## 2017-07-26 VITALS — BP 127/71 | HR 77 | Temp 97.0°F | Ht 70.0 in | Wt 176.0 lb

## 2017-07-26 DIAGNOSIS — E782 Mixed hyperlipidemia: Secondary | ICD-10-CM | POA: Diagnosis not present

## 2017-07-26 DIAGNOSIS — N401 Enlarged prostate with lower urinary tract symptoms: Secondary | ICD-10-CM | POA: Diagnosis not present

## 2017-07-26 DIAGNOSIS — E559 Vitamin D deficiency, unspecified: Secondary | ICD-10-CM | POA: Diagnosis not present

## 2017-07-26 DIAGNOSIS — I10 Essential (primary) hypertension: Secondary | ICD-10-CM

## 2017-07-26 DIAGNOSIS — D7589 Other specified diseases of blood and blood-forming organs: Secondary | ICD-10-CM | POA: Diagnosis not present

## 2017-07-26 DIAGNOSIS — Z125 Encounter for screening for malignant neoplasm of prostate: Secondary | ICD-10-CM | POA: Diagnosis not present

## 2017-07-26 DIAGNOSIS — R351 Nocturia: Secondary | ICD-10-CM | POA: Diagnosis not present

## 2017-07-26 MED ORDER — KETOCONAZOLE 2 % EX CREA: 1.0000 "application " | TOPICAL_CREAM | Freq: Two times a day (BID) | CUTANEOUS | 5 refills | Status: DC

## 2017-07-26 MED ORDER — TRIAMCINOLONE ACETONIDE 0.1 % EX CREA: 1.0000 "application " | TOPICAL_CREAM | Freq: Three times a day (TID) | CUTANEOUS | 0 refills | Status: DC

## 2017-07-26 NOTE — Progress Notes (Signed)
Subjective:  Patient ID: Tyler Diaz,  male    DOB: 01/15/1946  Age: 72 y.o.    CC: Hyperlipidemia   HPI Tyler Diaz presents for  follow-up of hypertension. Patient has no history of headache chest pain or shortness of breath or recent cough. Patient also denies symptoms of TIA such as numbness weakness lateralizing. Patient checks  blood pressure at home. Recent readings have been good Patient denies side effects from medication. States taking it regularly.  Patient also  in for follow-up of elevated cholesterol. Doing well without complaints on current medication. Denies side effects of statin including myalgia and arthralgia and nausea. Also in today for liver function testing. Currently no chest pain, shortness of breath or other cardiovascular related symptoms noted.  The report from him at pathology oncology was also reviewed showing that he is now 5 years out and in remission officially for his rectal carcinoma.  As part of that report they noted that he had a new onset macrocytosis that should be followed although it is likely just from his chemotherapy.   History Tyler Diaz has a past medical history of Anemia, Arthritis, Blood clot in vein, C. difficile colitis (08/19/2013), Colon cancer ([place]) (07/18/12 bx), GERD (gastroesophageal reflux disease), Hearing loss, Heart murmur, Hematochezia, Herpes zoster - R flank - with severe pain (03/11/2013), History of shingles, Hyperlipidemia, Hypertension, Hypothyroidism (as child), Ileostomy - diverting loop - s/p takedown 06/28/2013 (01/14/2013), Mitral regurgitation due to partially flail posterior mitral valve leaflet, Nocturia, Numbness, Peripheral vascular disease ([place]), Radiation, Rectal cancer ([place]), Rheumatic fever (as child), Sinusitis, Tinnitus, and Umbilical hernia, incarcerated - s/p primary repair July 2014 (01/03/2013).   He has a past surgical history that includes Carpal tunnel release (Right, 2002); Tonsillectomy; Laparoscopic low  anterior resection (N/A, 12/27/2012); Laparoscopic low anterior rescection with coloanal anastomosis (N/A, 12/27/2012); Ileostomy (N/A, 12/27/2012); Umbilical hernia repair (12/27/2012); Inguinal hernia repair (Left); Ileostomy closure (N/A, 06/28/2013); TEE without cardioversion (N/A, 07/10/2013); TEE without cardioversion (N/A, 08/21/2013); Inguinal hernia repair (Bilateral, 08/13/2015); and Insertion of mesh (Bilateral, 08/13/2015).   His family history includes COPD in his brother; Cancer in his mother and sister; Healthy in his son and son; Heart disease in his brother and father.He reports that he quit smoking about 45 years ago. His smoking use included cigarettes. He has a 30.00 pack-year smoking history. he has never used smokeless tobacco. He reports that he does not drink alcohol or use drugs.  Current Outpatient Medications on File Prior to Visit  Medication Sig Dispense Refill  . acetaminophen (TYLENOL) 500 MG tablet Take 500 mg by mouth every 6 (six) hours as needed for headache.    . chlorthalidone (HYGROTON) 25 MG tablet Take 0.5 tablets (12.5 mg total) by mouth every morning. 45 tablet 3  . lisinopril (PRINIVIL,ZESTRIL) 40 MG tablet Take 1 tablet by mouth daily.  3  . Multiple Vitamin (MULTIVITAMIN WITH MINERALS) TABS Take 1 tablet by mouth every morning.     Marland Kitchen oxymetazoline (AFRIN) 0.05 % nasal spray Place 1 spray into both nostrils 2 (two) times daily as needed for congestion.    . pravastatin (PRAVACHOL) 20 MG tablet Take 20 mg by mouth daily.     . Probiotic Product (DIGESTIVE ADVANTAGE PO) Take 1 tablet by mouth daily.     No current facility-administered medications on file prior to visit.     ROS Review of Systems  Constitutional: Negative for chills, diaphoresis, fever and unexpected weight change.  HENT: Negative for congestion, hearing loss,  rhinorrhea and sore throat.   Eyes: Negative for visual disturbance.  Respiratory: Negative for cough and shortness of breath.     Cardiovascular: Negative for chest pain.  Gastrointestinal: Negative for abdominal pain, constipation and diarrhea.  Genitourinary: Negative for dysuria and flank pain.  Musculoskeletal: Negative for arthralgias and joint swelling.  Skin: Negative for rash.  Neurological: Negative for dizziness and headaches.  Psychiatric/Behavioral: Negative for dysphoric mood and sleep disturbance.    Objective:  BP 127/71   Pulse 77   Temp (!) 97 F (36.1 C) (Oral)   Ht 5' 10"  (1.778 m)   Wt 176 lb (79.8 kg)   BMI 25.25 kg/m   BP Readings from Last 3 Encounters:  07/26/17 127/71  07/24/17 129/73  01/20/17 135/80    Wt Readings from Last 3 Encounters:  07/26/17 176 lb (79.8 kg)  07/24/17 174 lb 3.2 oz (79 kg)  01/20/17 180 lb (81.6 kg)     Physical Exam  Constitutional: He is oriented to person, place, and time. He appears well-developed and well-nourished. No distress.  HENT:  Head: Normocephalic and atraumatic.  Right Ear: External ear normal.  Left Ear: External ear normal.  Nose: Nose normal.  Mouth/Throat: Oropharynx is clear and moist.  Eyes: Conjunctivae and EOM are normal. Pupils are equal, round, and reactive to light.  Neck: Normal range of motion. Neck supple. No thyromegaly present.  Cardiovascular: Normal rate, regular rhythm and normal heart sounds.  No murmur heard. Pulmonary/Chest: Effort normal and breath sounds normal. No respiratory distress. He has no wheezes. He has no rales.  Abdominal: Soft. Bowel sounds are normal. He exhibits no distension. There is no tenderness.  Lymphadenopathy:    He has no cervical adenopathy.  Neurological: He is alert and oriented to person, place, and time. He has normal reflexes.  Skin: Skin is warm and dry.  Psychiatric: He has a normal mood and affect. His behavior is normal. Judgment and thought content normal.        Assessment & Plan:   Tyler Diaz was seen today for hyperlipidemia.  Diagnoses and all orders for this  visit:  Essential hypertension  Mixed hyperlipidemia -     CMP14+EGFR -     Lipid panel  Macrocytosis -     Vitamin B12 -     Folate  Vitamin D deficiency -     VITAMIN D 25 Hydroxy (Vit-D Deficiency, Fractures)  Screening for prostate cancer -     PSA, total and free  Other orders -     ketoconazole (NIZORAL) 2 % cream; Apply 1 application topically 2 (two) times daily. -     triamcinolone cream (KENALOG) 0.1 %; Apply 1 application topically 3 (three) times daily.   I am having Tyler Diaz start on triamcinolone cream. I am also having him maintain his multivitamin with minerals, oxymetazoline, acetaminophen, pravastatin, lisinopril, Probiotic Product (DIGESTIVE ADVANTAGE PO), chlorthalidone, and ketoconazole.  Meds ordered this encounter  Medications  . ketoconazole (NIZORAL) 2 % cream    Sig: Apply 1 application topically 2 (two) times daily.    Dispense:  60 g    Refill:  5  . triamcinolone cream (KENALOG) 0.1 %    Sig: Apply 1 application topically 3 (three) times daily.    Dispense:  80 g    Refill:  0     Follow-up: Return in about 6 months (around 01/23/2018).  Claretta Fraise, M.D.

## 2017-07-27 LAB — CMP14+EGFR
ALBUMIN: 4.4 g/dL (ref 3.5–4.8)
ALK PHOS: 44 IU/L (ref 39–117)
ALT: 18 IU/L (ref 0–44)
AST: 19 IU/L (ref 0–40)
Albumin/Globulin Ratio: 2.1 (ref 1.2–2.2)
BUN / CREAT RATIO: 18 (ref 10–24)
BUN: 21 mg/dL (ref 8–27)
Bilirubin Total: 1 mg/dL (ref 0.0–1.2)
CO2: 24 mmol/L (ref 20–29)
CREATININE: 1.15 mg/dL (ref 0.76–1.27)
Calcium: 9.1 mg/dL (ref 8.6–10.2)
Chloride: 104 mmol/L (ref 96–106)
GFR calc Af Amer: 73 mL/min/{1.73_m2} (ref >59)
GFR calc non Af Amer: 63 mL/min/{1.73_m2} (ref >59)
GLOBULIN, TOTAL: 2.1 g/dL (ref 1.5–4.5)
Glucose: 98 mg/dL (ref 65–99)
Potassium: 4.7 mmol/L (ref 3.5–5.2)
SODIUM: 142 mmol/L (ref 134–144)
Total Protein: 6.5 g/dL (ref 6.0–8.5)

## 2017-07-27 LAB — LIPID PANEL
CHOL/HDL RATIO: 3.2 ratio (ref 0.0–5.0)
Cholesterol, Total: 135 mg/dL (ref 100–199)
HDL: 42 mg/dL (ref >39)
LDL CALC: 73 mg/dL (ref 0–99)
Triglycerides: 102 mg/dL (ref 0–149)
VLDL Cholesterol Cal: 20 mg/dL (ref 5–40)

## 2017-07-27 LAB — VITAMIN B12: Vitamin B-12: 577 pg/mL (ref 232–1245)

## 2017-07-27 LAB — PSA, TOTAL AND FREE
PROSTATE SPECIFIC AG, SERUM: 0.7 ng/mL (ref 0.0–4.0)
PSA FREE: 0.22 ng/mL
PSA, Free Pct: 31.4 %

## 2017-07-27 LAB — FOLATE

## 2017-07-27 LAB — VITAMIN D 25 HYDROXY (VIT D DEFICIENCY, FRACTURES): Vit D, 25-Hydroxy: 38.2 ng/mL (ref 30.0–100.0)

## 2017-11-13 ENCOUNTER — Ambulatory Visit (INDEPENDENT_AMBULATORY_CARE_PROVIDER_SITE_OTHER): Payer: PPO | Admitting: *Deleted

## 2017-11-13 ENCOUNTER — Encounter: Payer: Self-pay | Admitting: *Deleted

## 2017-11-13 VITALS — BP 133/79 | HR 69 | Ht 69.5 in | Wt 170.0 lb

## 2017-11-13 DIAGNOSIS — Z0000 Encounter for general adult medical examination without abnormal findings: Secondary | ICD-10-CM

## 2017-11-13 NOTE — Progress Notes (Addendum)
Subjective:   Tyler Diaz is a 72 y.o. male who presents for a Medicare Annual Wellness Visit. Tyler Diaz lives at home with his wife and adult son. He has two adult children and one granddaughter.   Review of Systems    Patient reports that her health is unchanged compared to last year.  Cardiac Risk Factors include: advanced age (>94men, >74 women);hypertension;sedentary lifestyle  Other systems negative unless otherwise noted in other area       Current Medications (verified) Outpatient Encounter Medications as of 11/13/2017  Medication Sig  . acetaminophen (TYLENOL) 500 MG tablet Take 500 mg by mouth every 6 (six) hours as needed for headache.  . chlorthalidone (HYGROTON) 25 MG tablet Take 0.5 tablets (12.5 mg total) by mouth every morning.  Marland Kitchen ketoconazole (NIZORAL) 2 % cream Apply 1 application topically 2 (two) times daily.  Marland Kitchen lisinopril (PRINIVIL,ZESTRIL) 40 MG tablet Take 1 tablet by mouth daily.  . Multiple Vitamin (MULTIVITAMIN WITH MINERALS) TABS Take 1 tablet by mouth every morning.   Marland Kitchen oxymetazoline (AFRIN) 0.05 % nasal spray Place 1 spray into both nostrils 2 (two) times daily as needed for congestion.  . pravastatin (PRAVACHOL) 20 MG tablet Take 20 mg by mouth daily.   . Probiotic Product (DIGESTIVE ADVANTAGE PO) Take 1 tablet by mouth daily.  Marland Kitchen triamcinolone cream (KENALOG) 0.1 % Apply 1 application topically 3 (three) times daily.   No facility-administered encounter medications on file as of 11/13/2017.     Allergies (verified) Shrimp [shellfish allergy]   History: Past Medical History:  Diagnosis Date  . Anemia    hx of  . Arthritis    hands/knees  . Blood clot in vein    RT ARM WITH PICC LINE   . C. difficile colitis 08/19/2013  . Colon cancer ([place]) 07/18/12 bx   rectum=Invasive adenocarcinoma w/ extracellular mucin,polyp=benign  . GERD (gastroesophageal reflux disease)    OCCASIONAL  . Hearing loss    partial greater on left  . Heart  murmur   . Hematochezia    recent  . Herpes zoster - R flank - with severe pain 03/11/2013  . History of shingles   . Hyperlipidemia   . Hypertension   . Hypothyroidism as child   hx of  . Ileostomy - diverting loop - s/p takedown 06/28/2013 01/14/2013  . Mitral regurgitation due to partially flail posterior mitral valve leaflet   . Nocturia   . Numbness    TOES / FINGERS DUE TO CHEMO  . Peripheral vascular disease (HCC)    neuropathy due to cancer treatment (fingers and toes)  . Radiation    FOR COLON CANCER  . Rectal cancer ([place])   . Rheumatic fever as child   hx  . Sinusitis    seasonal  . Tinnitus    left ear  . Umbilical hernia, incarcerated - s/p primary repair July 2014 01/03/2013   Past Surgical History:  Procedure Laterality Date  . CARPAL TUNNEL RELEASE Right 2002  . ILEOSTOMY N/A 12/27/2012   Procedure: DIVERTING LOOP ILEOSTOMY;  Surgeon: Adin Hector, MD;  Location: [facility];  Service: General;  Laterality: N/A;  . ILEOSTOMY CLOSURE N/A 06/28/2013   Procedure: loop ILEOSTOMY TAKEDOWN examination of anesthesia ;  Surgeon: Adin Hector, MD;  Location: [facility];  Service: General;  Laterality: N/A;  . INGUINAL HERNIA REPAIR Left   . INGUINAL HERNIA REPAIR Bilateral 08/13/2015   Procedure: LAPAROSCOPIC BILATERAL INGUINAL HERNIA REPAIR WITH MESH;  Surgeon: Michael Boston, MD;  Location: [facility];  Service: General;  Laterality: Bilateral;  . INSERTION OF MESH Bilateral 08/13/2015   Procedure: INSERTION OF MESH;  Surgeon: Michael Boston, MD;  Location: [facility];  Service: General;  Laterality: Bilateral;  . LAPAROSCOPIC LOW ANTERIOR RESCECTION WITH COLOANAL ANASTOMOSIS N/A 12/27/2012   Procedure: LAPAROSCOPIC LOW ANTERIOR RESCECTION WITH COLOANAL ANASTOMOSIS;  Surgeon: Adin Hector, MD;  Location: [facility];  Service: General;  Laterality: N/A;  . LAPAROSCOPIC LOW ANTERIOR RESECTION N/A 12/27/2012   Procedure: LAPAROSCOPIC LOW ANTERIOR RESECTION, COLO-ANAL ANASTOMOSIS, DIVERTING LOOP  ILEOSTOMY,SPLENIC FLEXURE MOBILIZATION, PRIMARY INCARCERATED UMBILICAL HERNIA REPAIR;  Surgeon: Adin Hector, MD;  Location: [facility];  Service: General;  Laterality: N/A;  . TEE WITHOUT CARDIOVERSION N/A 07/10/2013   Procedure: TRANSESOPHAGEAL ECHOCARDIOGRAM (TEE);  Surgeon: Candee Furbish, MD;  Location[facility];  Service: Cardiovascular;  Laterality: N/A;  . TEE WITHOUT CARDIOVERSION N/A 08/21/2013   Procedure: TRANSESOPHAGEAL ECHOCARDIOGRAM (TEE);  Surgeon: Sanda Klein, MD;  Location: [facility] ENDOSCOPY;  Service: Cardiovascular;  Laterality: N/A;  . TONSILLECTOMY     as child  . UMBILICAL HERNIA REPAIR  12/27/2012   Procedure: PRIMARY REPAIR INCARCERATED UMBILICAL HERNIA;  Surgeon: Adin Hector, MD;  Location: [facility];  Service: General;;   Family History  Problem Relation Age of Onset  . Cancer Mother        abdominal ca ?ovarian?  . Heart disease Father   . Cancer Sister        liver cancer  . Heart disease Brother   . COPD Brother   . Healthy Son   . Healthy Son    Social History   Socioeconomic History  . Marital status: Married    Spouse name: Not on file  . Number of children: 2  . Years of education: 10  . Highest education level: 10th grade  Occupational History  . Occupation: Retired    Comment: Chiropractor  Social Needs  . Financial resource strain: Not very hard  . Food insecurity:    Worry: Never true    Inability: Never true  . Transportation needs:    Medical: No    Non-medical: No  Tobacco Use  . Smoking status: Former Smoker    Packs/day: 1.50    Years: 20.00    Pack years: 30.00    Types: Cigarettes    Last attempt to quit: 07/27/1972    Years since quitting: 45.3  . Smokeless tobacco: Never Used  . Tobacco comment: quit smoking 35 years ago  Substance and Sexual Activity  . Alcohol use: No  . Drug use: No  . Sexual activity: Yes  Lifestyle  . Physical activity:    Days per week: 7 days    Minutes per session: 60 min  .  Stress: Only a little  Relationships  . Social connections:    Talks on phone: More than three times a week    Gets together: More than three times a week    Attends religious service: Never    Active member of club or organization: No    Attends meetings of clubs or organizations: Never    Relationship status: Married  Other Topics Concern  . 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  Married and lives at home with his wife. Has two adult children. One son lives with them.     Tobacco Counseling Counseling given: Not Answered Comment: quit smoking  35 years ago   Clinical Intake:     Pain : 0-10 Pain Score: 1  Pain Type: Chronic pain Pain Location: Rectum Pain Descriptors / Indicators: Aching, Dull Pain Onset: More than a month ago Pain Frequency: Constant     Nutritional Status: BMI 25 -29 Overweight Diabetes: No  How often do you need to have someone help you when you read instructions, pamphlets, or other written materials from your doctor or pharmacy?: 1 - Never What is the last grade level you completed in school?: 10th  Interpreter Needed?: No  Information entered by :: Chong Sicilian, RN  Activities of Daily Living In your present state of health, do you have any difficulty performing the following activities: 11/13/2017  Hearing? Y  Comment Has some difficulty with left year  Vision? N  Comment has yearly eye exams  Difficulty concentrating or making decisions? N  Walking or climbing stairs? N  Dressing or bathing? N  Doing errands, shopping? N  Preparing Food and eating ? N  Using the Toilet? N  In the past six months, have you accidently leaked urine? N  Do you have problems with loss of bowel control? N  Managing your Medications? N  Comment Has a routine and takes them first thing in the morning  Managing your Finances? N  Housekeeping or managing your Housekeeping? N  Some recent data might be hidden      Immunizations and Health Maintenance Immunization History  Administered Date(s) Administered  . Influenza,inj,Quad PF,6+ Mos 05/21/2013, 03/13/2014, 03/10/2015, 03/14/2016, 03/08/2017  . Pneumococcal Polysaccharide-23 05/21/2013   Health Maintenance Due  Topic Date Due  . Hepatitis C Screening  Jun 10, 1945  . 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 No No No No No    Safety Is the patient's home free of loose throw rugs in walkways, pet beds, electrical cords, etc?   yes      Grab bars in the bathroom? yes      Walkin shower? no      Shower Seat? no      Handrails on the stairs?   yes      Adequate lighting?   yes  Patient Care Team: Claretta Fraise, MD as PCP - General (Family Medicine) Michael Boston, MD as Consulting Physician (Colon and Rectal Surgery) Wilford Corner, MD as Consulting Physician (Gastroenterology) Gatha Mayer, MD as Consulting Physician (Radiation Oncology) [name], MD as Consulting Physician (Oncology) Carlyle Basques, MD as Consulting Physician (Infectious Diseases) Wilford Corner, MD as Consulting Physician (Gastroenterology) Jacolyn Reedy, MD as Consulting Physician (Cardiology) Harlen Labs, MD as Referring Physician (Optometry)  No hospitalizations, ER visits, or surgeries this past year.  Objective:    Today's Vitals   11/13/17 1108  BP:  133/79  Pulse: 69  Weight: 170 lb (77.1 kg)  Height: 5' 9.5" (1.765 m)  PainSc: 1  Body mass index is 24.74 kg/m.  Advanced Directives 11/13/2017 11/13/2017 11/11/2016 07/18/2016 01/19/2016 08/13/2015 08/06/2015  Does Patient Have a Medical Advance Directive? (No Data) No No No No No No  Would patient like information on creating a medical advance directive? - No - Patient declined Yes (MAU/Ambulatory/Procedural Areas - Information given) No - Patient declined No - patient declined information No - patient declined information No - patient declined information  Pre-existing out of facility DNR order (yellow form or pink MOST form) - - - - - - -    Hearing/Vision  normal or No deficits noted during visit.   Cognitive Function: MMSE - Mini Mental State Exam 11/13/2017 11/13/2017 11/11/2016  Orientation to time 5 5 5   Orientation to Place 5 5 5   Registration 3 - 3  Attention/ Calculation 0 - 4  Attention/Calculation-comments didn't attempt. Was able to spell forward - -  Recall 3 - 1  Language- name 2 objects 2 - 2  Language- repeat 1 - 1  Language- follow 3 step command 3 - 3  Language- read & follow direction 1 - 1  Write a sentence 1 - 1  Copy design 1 - 1  Total score 25 - 27       Normal Cognitive Function Screening: No: Score on the lower side but did not attempt to spell WORLD backwards. Was able to spell it forwards. I didn't not notice any difficulty with memory in other areas of the visit.      Assessment:   This is a routine wellness examination for Delrae Niko.      Plan:    Goals    . Exercise 150 min/wk Moderate Activity    . Prevent falls     Move carefully to avoid falls       Keep f/u with Claretta Fraise, MD and any other specialty appointments you may have Continue current medications Move carefully to avoid falls. Use assistive devices like a can or walker if needed. Aim for at least 150 minutes of moderate activity a week. This can be chair exercises if  necessary. Reading or puzzles are a good way to exercise your brain Stay connected with friends and family. Social connections are beneficial to your emotional and mental health.   Review and return a signed, witnessed, and notarized copy of Advance Directives  Health Maintenance: Tdap Vaccine recommended: yes Zostavax (Shingles vaccine) recommended:discussed but not interested Prevnar or Pneumovax (pneumonia vaccines) recommended:no  Cancer Screenings: Lung: Low Dose CT Chest recommended if Age 60-80 years, 30 pack-year currently smoking OR have quit w/in 15years. Patient does not qualify. Colon cancer screening recommended: no  Additional Screenings Hepatitis C Screening recommended: yes Dexa Scan recommended: no Diabetic Eye Exam recommended: no  No orders of the defined types were placed in this encounter.   I have personally reviewed and noted the following in the patient's chart:   . Medical and social history . Use of alcohol, tobacco or illicit drugs  . Current medications and supplements . Functional ability and status . Nutritional status . Physical activity . Advanced directives . List of other physicians . Hospitalizations, surgeries, and ER visits in previous 12 months . Vitals . Screenings to include cognitive, depression, and falls . Referrals and appointments  In addition, I have reviewed and discussed with patient certain preventive protocols, quality metrics, and best practice recommendations. A written personalized care plan for preventive services as well as general preventive health recommendations were provided to patient.  Chong Sicilian, RN   11/13/2017     I have reviewed and agree with the above AWV documentation.   Claretta Fraise, M.D.

## 2017-11-13 NOTE — Patient Instructions (Addendum)
  Tyler Diaz , Thank you for taking time to come for your Medicare Wellness Visit. I appreciate your ongoing commitment to your health goals. Please review the following plan we discussed and let me know if I can assist you in the future.   These are the goals we discussed: Goals    . Exercise 150 min/wk Moderate Activity    . Prevent falls     Move carefully to avoid falls       This is a list of the screening recommended for you and due dates:  Health Maintenance  Topic Date Due  .  Hepatitis C: One time screening is recommended by Center for Disease Control  (CDC) for  adults born from [birth year] through 1967.   1946/06/01  . Tetanus Vaccine  09/11/1962  . Flu Shot  01/04/2018  . Colon Cancer Screening  12/05/2023  . Pneumonia vaccines  Completed     Check with your pharmacy about a Tdap (tetanus shot) Review advanced directives and return a signed/notarized copy to our office

## 2017-11-15 NOTE — Progress Notes (Signed)
You have cosigned this note but it still needs the attestation statement. I signed off to close the note and didn't realize it. When you get a chance, can you add the "I have reviewed and agree with the above AWV documentation" phrase? Thank you, Cyril Mourning

## 2017-11-27 DIAGNOSIS — Z86718 Personal history of other venous thrombosis and embolism: Secondary | ICD-10-CM | POA: Diagnosis not present

## 2017-11-27 DIAGNOSIS — I119 Hypertensive heart disease without heart failure: Secondary | ICD-10-CM | POA: Diagnosis not present

## 2017-11-27 DIAGNOSIS — E7849 Other hyperlipidemia: Secondary | ICD-10-CM | POA: Diagnosis not present

## 2017-11-27 DIAGNOSIS — I340 Nonrheumatic mitral (valve) insufficiency: Secondary | ICD-10-CM | POA: Diagnosis not present

## 2017-11-27 DIAGNOSIS — Z85048 Personal history of other malignant neoplasm of rectum, rectosigmoid junction, and anus: Secondary | ICD-10-CM | POA: Diagnosis not present

## 2017-11-27 DIAGNOSIS — I10 Essential (primary) hypertension: Secondary | ICD-10-CM | POA: Diagnosis not present

## 2018-01-23 ENCOUNTER — Ambulatory Visit (INDEPENDENT_AMBULATORY_CARE_PROVIDER_SITE_OTHER): Payer: PPO | Admitting: Family Medicine

## 2018-01-23 ENCOUNTER — Encounter: Payer: Self-pay | Admitting: Family Medicine

## 2018-01-23 VITALS — BP 125/64 | HR 76 | Temp 97.8°F | Ht 69.5 in | Wt 177.0 lb

## 2018-01-23 DIAGNOSIS — I10 Essential (primary) hypertension: Secondary | ICD-10-CM

## 2018-01-23 DIAGNOSIS — E782 Mixed hyperlipidemia: Secondary | ICD-10-CM

## 2018-01-23 LAB — LIPID PANEL
CHOLESTEROL TOTAL: 142 mg/dL (ref 100–199)
Chol/HDL Ratio: 3.8 ratio (ref 0.0–5.0)
HDL: 37 mg/dL — AB (ref >39)
LDL Calculated: 70 mg/dL (ref 0–99)
Triglycerides: 173 mg/dL — ABNORMAL HIGH (ref 0–149)
VLDL CHOLESTEROL CAL: 35 mg/dL (ref 5–40)

## 2018-01-23 LAB — CMP14+EGFR
ALT: 22 IU/L (ref 0–44)
AST: 20 IU/L (ref 0–40)
Albumin/Globulin Ratio: 2.4 — ABNORMAL HIGH (ref 1.2–2.2)
Albumin: 4.5 g/dL (ref 3.5–4.8)
Alkaline Phosphatase: 47 IU/L (ref 39–117)
BILIRUBIN TOTAL: 1.3 mg/dL — AB (ref 0.0–1.2)
BUN/Creatinine Ratio: 17 (ref 10–24)
BUN: 20 mg/dL (ref 8–27)
CHLORIDE: 102 mmol/L (ref 96–106)
CO2: 23 mmol/L (ref 20–29)
Calcium: 9.4 mg/dL (ref 8.6–10.2)
Creatinine, Ser: 1.21 mg/dL (ref 0.76–1.27)
GFR calc non Af Amer: 59 mL/min/{1.73_m2} — ABNORMAL LOW (ref >59)
GFR, EST AFRICAN AMERICAN: 68 mL/min/{1.73_m2} (ref >59)
GLUCOSE: 90 mg/dL (ref 65–99)
Globulin, Total: 1.9 g/dL (ref 1.5–4.5)
Potassium: 4.8 mmol/L (ref 3.5–5.2)
Sodium: 141 mmol/L (ref 134–144)
TOTAL PROTEIN: 6.4 g/dL (ref 6.0–8.5)

## 2018-01-23 MED ORDER — CHLORTHALIDONE 25 MG PO TABS: 12.5000 mg | ORAL_TABLET | ORAL | 3 refills | Status: DC

## 2018-01-23 MED ORDER — TRIAMCINOLONE ACETONIDE 0.1 % EX CREA: 1.0000 "application " | TOPICAL_CREAM | Freq: Three times a day (TID) | CUTANEOUS | 3 refills | Status: DC

## 2018-01-23 NOTE — Progress Notes (Signed)
Subjective:  Patient ID: Tyler Diaz,  male    DOB: Jul 22, 1945  Age: 72 y.o.    CC: Medical Management of Chronic Issues   HPI Tyler Diaz presents for  follow-up of hypertension. Patient has no history of headache chest pain or shortness of breath or recent cough. Patient also denies symptoms of TIA such as numbness weakness lateralizing. Patient denies side effects from medication. States taking it regularly.  Patient also  in for follow-up of elevated cholesterol. Doing well without complaints on current medication. Denies side effects  including myalgia and arthralgia and nausea. Also in today for liver function testing. Currently no chest pain, shortness of breath or other cardiovascular related symptoms noted.    History Tyler Diaz has a past medical history of Anemia, Arthritis, Blood clot in vein, C. difficile colitis (08/19/2013), Colon cancer ([place]) (07/18/12 bx), GERD (gastroesophageal reflux disease), Hearing loss, Heart murmur, Hematochezia, Herpes zoster - R flank - with severe pain (03/11/2013), History of shingles, Hyperlipidemia, Hypertension, Hypothyroidism (as child), Ileostomy - diverting loop - s/p takedown 06/28/2013 (01/14/2013), Mitral regurgitation due to partially flail posterior mitral valve leaflet, Nocturia, Numbness, Peripheral vascular disease ([place]), Radiation, Rectal cancer ([place]), Rheumatic fever (as child), Sinusitis, Tinnitus, and Umbilical hernia, incarcerated - s/p primary repair July 2014 (01/03/2013).   He has a past surgical history that includes Carpal tunnel release (Right, 2002); Tonsillectomy; Laparoscopic low anterior resection (N/A, 12/27/2012); Laparoscopic low anterior rescection with coloanal anastomosis (N/A, 12/27/2012); Ileostomy (N/A, 12/27/2012); Umbilical hernia repair (12/27/2012); Inguinal hernia repair (Left); Ileostomy closure (N/A, 06/28/2013); TEE without cardioversion (N/A, 07/10/2013); TEE without cardioversion (N/A, 08/21/2013); Inguinal hernia  repair (Bilateral, 08/13/2015); and Insertion of mesh (Bilateral, 08/13/2015).   His family history includes COPD in his brother; Cancer in his mother and sister; Healthy in his son and son; Heart disease in his brother and father.He reports that he quit smoking about 45 years ago. His smoking use included cigarettes. He has a 30.00 pack-year smoking history. He has never used smokeless tobacco. He reports that he does not drink alcohol or use drugs.  Current Outpatient Medications on File Prior to Visit  Medication Sig Dispense Refill  . acetaminophen (TYLENOL) 500 MG tablet Take 500 mg by mouth every 6 (six) hours as needed for headache.    . ketoconazole (NIZORAL) 2 % cream Apply 1 application topically 2 (two) times daily. 60 g 5  . lisinopril (PRINIVIL,ZESTRIL) 40 MG tablet Take 1 tablet by mouth daily.  3  . Multiple Vitamin (MULTIVITAMIN WITH MINERALS) TABS Take 1 tablet by mouth every morning.     Marland Kitchen oxymetazoline (AFRIN) 0.05 % nasal spray Place 1 spray into both nostrils 2 (two) times daily as needed for congestion.    . pravastatin (PRAVACHOL) 20 MG tablet Take 20 mg by mouth daily.     . Probiotic Product (DIGESTIVE ADVANTAGE PO) Take 1 tablet by mouth daily.     No current facility-administered medications on file prior to visit.     ROS Review of Systems  Constitutional: Negative.   HENT: Negative.   Eyes: Negative for visual disturbance.  Respiratory: Negative for cough and shortness of breath.   Cardiovascular: Negative for chest pain and leg swelling.  Gastrointestinal: Negative for abdominal pain, diarrhea, nausea and vomiting.  Genitourinary: Negative for difficulty urinating.  Musculoskeletal: Negative for arthralgias and myalgias.  Skin: Negative for rash.  Neurological: Negative for headaches.  Psychiatric/Behavioral: Negative for sleep disturbance.    Objective:  BP 125/64   Pulse  76   Temp 97.8 F (36.6 C) (Oral)   Ht 5' 9.5" (1.765 m)   Wt 177 lb (80.3 kg)    BMI 25.76 kg/m   BP Readings from Last 3 Encounters:  01/23/18 125/64  11/13/17 133/79  07/26/17 127/71    Wt Readings from Last 3 Encounters:  01/23/18 177 lb (80.3 kg)  11/13/17 170 lb (77.1 kg)  07/26/17 176 lb (79.8 kg)     Physical Exam  Constitutional: He is oriented to person, place, and time. He appears well-developed and well-nourished. No distress.  HENT:  Head: Normocephalic and atraumatic.  Right Ear: External ear normal.  Left Ear: External ear normal.  Nose: Nose normal.  Mouth/Throat: Oropharynx is clear and moist.  Eyes: Pupils are equal, round, and reactive to light. Conjunctivae and EOM are normal.  Neck: Normal range of motion. Neck supple.  Cardiovascular: Normal rate, regular rhythm and normal heart sounds.  No murmur heard. Pulmonary/Chest: Effort normal and breath sounds normal. No respiratory distress. He has no wheezes. He has no rales.  Abdominal: Soft. There is no tenderness.  Musculoskeletal: Normal range of motion.  Neurological: He is alert and oriented to person, place, and time. He has normal reflexes.  Skin: Skin is warm and dry.  Psychiatric: He has a normal mood and affect. His behavior is normal. Judgment and thought content normal.    Diabetic Foot Exam - Simple   No data filed        Assessment & Plan:   Tyler Diaz was seen today for medical management of chronic issues.  Diagnoses and all orders for this visit:  Mixed hyperlipidemia -     CMP14+EGFR -     Lipid panel  Essential hypertension  Other orders -     chlorthalidone (HYGROTON) 25 MG tablet; Take 0.5 tablets (12.5 mg total) by mouth every morning. -     triamcinolone cream (KENALOG) 0.1 %; Apply 1 application topically 3 (three) times daily.   I am having Tyler Diaz. Tyler Diaz maintain his multivitamin with minerals, oxymetazoline, acetaminophen, pravastatin, lisinopril, Probiotic Product (DIGESTIVE ADVANTAGE PO), ketoconazole, chlorthalidone, and triamcinolone  cream.  Meds ordered this encounter  Medications  . chlorthalidone (HYGROTON) 25 MG tablet    Sig: Take 0.5 tablets (12.5 mg total) by mouth every morning.    Dispense:  45 tablet    Refill:  3  . triamcinolone cream (KENALOG) 0.1 %    Sig: Apply 1 application topically 3 (three) times daily.    Dispense:  80 g    Refill:  3     Follow-up: Return in about 6 months (around 07/26/2018).  Claretta Fraise, M.D.

## 2018-03-16 ENCOUNTER — Ambulatory Visit (INDEPENDENT_AMBULATORY_CARE_PROVIDER_SITE_OTHER): Payer: PPO

## 2018-03-16 DIAGNOSIS — Z23 Encounter for immunization: Secondary | ICD-10-CM

## 2018-03-19 ENCOUNTER — Ambulatory Visit: Payer: PPO

## 2018-04-09 ENCOUNTER — Telehealth: Payer: Self-pay | Admitting: Cardiology

## 2018-04-09 MED ORDER — LISINOPRIL 40 MG PO TABS: 40.0000 mg | ORAL_TABLET | Freq: Every day | ORAL | 0 refills | Status: DC

## 2018-04-09 NOTE — Telephone Encounter (Signed)
Lisinopril 40 mg daily refilled per Dr. Agustin Cree.

## 2018-04-09 NOTE — Telephone Encounter (Signed)
° ° ° °  1. Which medications need to be refilled? (please list name of each medication and dose if known) Lisinopril 40mg  1QD  2. Which pharmacy/location (including street and city if local pharmacy) is medication to be sent to? [id number]  3. Do they need a 30 day or 90 day supply? [place]

## 2018-04-13 ENCOUNTER — Telehealth: Payer: Self-pay | Admitting: Cardiology

## 2018-04-18 NOTE — Telephone Encounter (Signed)
done

## 2018-05-11 ENCOUNTER — Other Ambulatory Visit: Payer: Self-pay | Admitting: Cardiology

## 2018-05-14 ENCOUNTER — Telehealth: Payer: Self-pay | Admitting: *Deleted

## 2018-05-14 NOTE — Telephone Encounter (Signed)
30 day supply of lisinopril sent to pharmacy as requested. Patient is due for follow up this month. Will have front desk contact patient to schedule an appointment.

## 2018-05-14 NOTE — Telephone Encounter (Signed)
Attempted to call patient to schedule an appt. Phone just rang and rang.

## 2018-05-22 ENCOUNTER — Encounter: Payer: Self-pay | Admitting: Pediatrics

## 2018-05-22 ENCOUNTER — Ambulatory Visit (INDEPENDENT_AMBULATORY_CARE_PROVIDER_SITE_OTHER): Payer: PPO | Admitting: Pediatrics

## 2018-05-22 VITALS — BP 132/76 | HR 76 | Temp 97.1°F | Ht 69.5 in | Wt 177.4 lb

## 2018-05-22 DIAGNOSIS — Z1159 Encounter for screening for other viral diseases: Secondary | ICD-10-CM

## 2018-05-22 DIAGNOSIS — I051 Rheumatic mitral insufficiency: Secondary | ICD-10-CM | POA: Diagnosis not present

## 2018-05-22 DIAGNOSIS — K5900 Constipation, unspecified: Secondary | ICD-10-CM | POA: Diagnosis not present

## 2018-05-22 DIAGNOSIS — I10 Essential (primary) hypertension: Secondary | ICD-10-CM | POA: Diagnosis not present

## 2018-05-22 DIAGNOSIS — D649 Anemia, unspecified: Secondary | ICD-10-CM

## 2018-05-22 DIAGNOSIS — E785 Hyperlipidemia, unspecified: Secondary | ICD-10-CM

## 2018-05-22 MED ORDER — PRAVASTATIN SODIUM 20 MG PO TABS: 20.0000 mg | ORAL_TABLET | Freq: Every day | ORAL | 3 refills | Status: DC

## 2018-05-22 MED ORDER — LISINOPRIL 40 MG PO TABS: 40.0000 mg | ORAL_TABLET | Freq: Every day | ORAL | 1 refills | Status: DC

## 2018-05-22 NOTE — Progress Notes (Signed)
Subjective:   Patient ID: Tyler Diaz, male    DOB: [birth year], 72 y.o.   MRN: [id number] CC: Follow-up Multiple medical problems HPI: Tyler Diaz is a 72 y.o. male   Hyperlipidemia: Taking medicine regularly, no side effects  Hypertension: Taking medicine regularly, no lightheaded or dizziness.  No chest pain or shortness of breath with exertion  Mitral valve regurgitation: Partially flail valve on echo.  Previously followed by Dr. Wynonia Diaz, was told he needs to find a new cardiologist.  Per Dr. Thurman Diaz notes, last seen in 11/2017, patient has been hesitant for intervention.  He had rheumatic fever when he was 72 years old.  Today he describes good exercise tolerance, working outside feeding chickens, mowing grass, doing yard work usually several hours in the morning, takes a break at lunch, several more hours in the afternoon.  Lives with his son, wife recently passed away.  History of colorectal cancer: Last colonoscopy in September 2018.  Patient thinks he is due next in 5 years from then.  Most recent colonoscopy we have on file was from 2015.  Anemia: Macrocytic.    Constipation: Stools have been more putty consistency since his surgery.  He takes MiraLAX as needed every few days, then has loose stools, sometimes has to take Imodium.  Appetite is been good.  Drinking 3-5 diet Dr. Samson Diaz daily.  Sometimes drink some water.  Not eating lots of fiber.  Relevant past medical, surgical, family and social history reviewed. Allergies and medications reviewed and updated. Social History   Tobacco Use  Smoking Status Former Smoker  . Packs/day: 1.50  . Years: 20.00  . Pack years: 30.00  . Types: Cigarettes  . Last attempt to quit: 07/27/1972  . Years since quitting: 45.8  Smokeless Tobacco Never Used  Tobacco Comment   quit smoking 35 years ago   ROS: Per HPI   Objective:    BP 132/76   Pulse 76   Temp (!) 97.1 F (36.2 C) (Oral)   Ht 5' 9.5" (1.765 m)   Wt 177 lb 6.4  oz (80.5 kg)   BMI 25.82 kg/m   Wt Readings from Last 3 Encounters:  05/22/18 177 lb 6.4 oz (80.5 kg)  01/23/18 177 lb (80.3 kg)  11/13/17 170 lb (77.1 kg)    Gen: NAD, alert, cooperative with exam, NCAT EYES: EOMI, no conjunctival injection, or no icterus ENT:  TMs pearly gray b/l, OP without erythema LYMPH: no cervical LAD CV: NRRR, holosystolic III/VI murmur heard throughout Resp: CTABL, no wheezes, normal WOB Ext: No edema, warm Neuro: Alert and oriented, strength equal b/l UE and LE, coordination grossly normal MSK: normal muscle bulk  Assessment & Plan:  Tyler Diaz was seen today for follow-up multiple medical problems.  Diagnoses and all orders for this visit:  Hyperlipidemia, unspecified hyperlipidemia type Stable, continue below -     pravastatin (PRAVACHOL) 20 MG tablet; Take 1 tablet (20 mg total) by mouth daily.  Essential hypertension Stable, continue current medicines. -     lisinopril (PRINIVIL,ZESTRIL) 40 MG tablet; Take 1 tablet (40 mg total) by mouth daily. -     BMP8+EGFR  Rheumatic mitral regurgitation -     Ambulatory referral to Cardiology  Encounter for hepatitis C screening test for low risk patient -     Hepatitis C antibody  Anemia, unspecified type -     CBC with Differential/Platelet  Constipation, unspecified constipation type Discussed titration of MiraLAX, increasing water intake, decreasing caffeinated beverage intake as able,  increasing fiber intake.  Follow up plan: Return in about 6 months (around 11/21/2018). Tyler Found, MD [facility]

## 2018-05-23 LAB — CBC WITH DIFFERENTIAL/PLATELET
BASOS: 2 %
Basophils Absolute: 0.1 10*3/uL (ref 0.0–0.2)
EOS (ABSOLUTE): 0.3 10*3/uL (ref 0.0–0.4)
Eos: 4 %
Hematocrit: 37.6 % (ref 37.5–51.0)
Hemoglobin: 12.9 g/dL — ABNORMAL LOW (ref 13.0–17.7)
IMMATURE GRANS (ABS): 0.1 10*3/uL (ref 0.0–0.1)
IMMATURE GRANULOCYTES: 1 %
LYMPHS: 13 %
Lymphocytes Absolute: 0.9 10*3/uL (ref 0.7–3.1)
MCH: 33.6 pg — ABNORMAL HIGH (ref 26.6–33.0)
MCHC: 34.3 g/dL (ref 31.5–35.7)
MCV: 98 fL — ABNORMAL HIGH (ref 79–97)
MONOS ABS: 0.9 10*3/uL (ref 0.1–0.9)
Monocytes: 12 %
NEUTROS PCT: 68 %
Neutrophils Absolute: 5.1 10*3/uL (ref 1.4–7.0)
PLATELETS: 182 10*3/uL (ref 150–450)
RBC: 3.84 x10E6/uL — ABNORMAL LOW (ref 4.14–5.80)
RDW: 13.1 % (ref 12.3–15.4)
WBC: 7.3 10*3/uL (ref 3.4–10.8)

## 2018-05-23 LAB — BMP8+EGFR
BUN/Creatinine Ratio: 22 (ref 10–24)
BUN: 31 mg/dL — ABNORMAL HIGH (ref 8–27)
CALCIUM: 9.9 mg/dL (ref 8.6–10.2)
CO2: 21 mmol/L (ref 20–29)
CREATININE: 1.38 mg/dL — AB (ref 0.76–1.27)
Chloride: 102 mmol/L (ref 96–106)
GFR calc Af Amer: 58 mL/min/{1.73_m2} — ABNORMAL LOW (ref >59)
GFR, EST NON AFRICAN AMERICAN: 50 mL/min/{1.73_m2} — AB (ref >59)
GLUCOSE: 89 mg/dL (ref 65–99)
POTASSIUM: 4.8 mmol/L (ref 3.5–5.2)
SODIUM: 142 mmol/L (ref 134–144)

## 2018-05-23 LAB — HEPATITIS C ANTIBODY: Hep C Virus Ab: <0.1 s/co ratio (ref 0.0–0.9)

## 2018-07-12 ENCOUNTER — Encounter: Payer: Self-pay | Admitting: *Deleted

## 2018-07-12 ENCOUNTER — Encounter: Payer: Self-pay | Admitting: Cardiology

## 2018-07-12 ENCOUNTER — Ambulatory Visit: Payer: PPO | Admitting: Cardiology

## 2018-07-12 VITALS — BP 127/79 | HR 74 | Ht 70.0 in | Wt 180.2 lb

## 2018-07-12 DIAGNOSIS — I10 Essential (primary) hypertension: Secondary | ICD-10-CM

## 2018-07-12 DIAGNOSIS — E782 Mixed hyperlipidemia: Secondary | ICD-10-CM | POA: Diagnosis not present

## 2018-07-12 DIAGNOSIS — I340 Nonrheumatic mitral (valve) insufficiency: Secondary | ICD-10-CM

## 2018-07-12 MED ORDER — HYDROCHLOROTHIAZIDE 25 MG PO TABS: 25.0000 mg | ORAL_TABLET | Freq: Every day | ORAL | 6 refills | Status: DC

## 2018-07-12 NOTE — Progress Notes (Addendum)
Clinical Summary Tyler Diaz is a 73 y.o.male seen as new patient, previously followed by Tyler Diaz  1. Mitral regurgitation - 11/2017 echo LVEF 60%, moderate to severe MR. Partial flail cusp of posterior leaflet. LVIDs 3.6 - 08/2013 TEE flail segment of P2 scallop posterior leaflet due to ruptured chordae, moderate to severe MR  - no recent SOB/DOE. No edema - tolerates daily activities without troubles.   2. HTN - compliant with meds - home bp's 100-130/60-80  3. Hyperlipidemia - 01/2018 TC 142 TG 173 HDL 37 LDL 70   4. Colorectal cancer - s/p surgery, currently cancer free.   Past Medical History:  Diagnosis Date  . Anemia    hx of  . Arthritis    hands/knees  . Blood clot in vein    RT ARM WITH PICC LINE   . C. difficile colitis 08/19/2013  . Colon cancer ([place]) 07/18/12 bx   rectum=Invasive adenocarcinoma w/ extracellular mucin,polyp=benign  . GERD (gastroesophageal reflux disease)    OCCASIONAL  . Hearing loss    partial greater on left  . Heart murmur   . Hematochezia    recent  . Herpes zoster - R flank - with severe pain 03/11/2013  . History of shingles   . Hyperlipidemia   . Hypertension   . Hypothyroidism as child   hx of  . Ileostomy - diverting loop - s/p takedown 06/28/2013 01/14/2013  . Mitral regurgitation due to partially flail posterior mitral valve leaflet   . Nocturia   . Numbness    TOES / FINGERS DUE TO CHEMO  . Peripheral vascular disease ([facility])    neuropathy due to cancer treatment (fingers and toes)  . Radiation    FOR COLON CANCER  . Rectal cancer ([place])   . Rheumatic fever as child   hx  . Sinusitis    seasonal  . Tinnitus    left ear  . Umbilical hernia, incarcerated - s/p primary repair July 2014 01/03/2013     Allergies  Allergen Reactions  . Shrimp [Shellfish Allergy] Hives, Nausea And Vomiting and Rash     Current Outpatient Medications  Medication Sig Dispense Refill  . acetaminophen (TYLENOL) 500 MG tablet Take 500  mg by mouth every 6 (six) hours as needed for headache.    . chlorthalidone (HYGROTON) 25 MG tablet Take 0.5 tablets (12.5 mg total) by mouth every morning. 45 tablet 3  . ketoconazole (NIZORAL) 2 % cream Apply 1 application topically 2 (two) times daily. 60 g 5  . lisinopril (PRINIVIL,ZESTRIL) 40 MG tablet Take 1 tablet (40 mg total) by mouth daily. 90 tablet 1  . Multiple Vitamin (MULTIVITAMIN WITH MINERALS) TABS Take 1 tablet by mouth every morning.     Marland Kitchen oxymetazoline (AFRIN) 0.05 % nasal spray Place 1 spray into both nostrils 2 (two) times daily as needed for congestion.    . pravastatin (PRAVACHOL) 20 MG tablet Take 1 tablet (20 mg total) by mouth daily. 90 tablet 3  . Probiotic Product (DIGESTIVE ADVANTAGE PO) Take 1 tablet by mouth daily.    Marland Kitchen triamcinolone cream (KENALOG) 0.1 % Apply 1 application topically 3 (three) times daily. 80 g 3   No current facility-administered medications for this visit.      Past Surgical History:  Procedure Laterality Date  . CARPAL TUNNEL RELEASE Right 2002  . ILEOSTOMY N/A 12/27/2012   Procedure: DIVERTING LOOP ILEOSTOMY;  Surgeon: Adin Hector, MD;  Location: [facility];  Service: General;  Laterality: N/A;  . ILEOSTOMY CLOSURE N/A 06/28/2013   Procedure: loop ILEOSTOMY TAKEDOWN examination of anesthesia ;  Surgeon: Adin Hector, MD;  Location: [facility];  Service: General;  Laterality: N/A;  . INGUINAL HERNIA REPAIR Left   . INGUINAL HERNIA REPAIR Bilateral 08/13/2015   Procedure: LAPAROSCOPIC BILATERAL INGUINAL HERNIA REPAIR WITH MESH;  Surgeon: Michael Boston, MD;  Location: [facility];  Service: General;  Laterality: Bilateral;  . INSERTION OF MESH Bilateral 08/13/2015   Procedure: INSERTION OF MESH;  Surgeon: Michael Boston, MD;  Location: [facility];  Service: General;  Laterality: Bilateral;  . LAPAROSCOPIC LOW ANTERIOR RESCECTION WITH COLOANAL ANASTOMOSIS N/A 12/27/2012   Procedure: LAPAROSCOPIC LOW ANTERIOR RESCECTION WITH COLOANAL ANASTOMOSIS;  Surgeon:  Adin Hector, MD;  Location: [facility];  Service: General;  Laterality: N/A;  . LAPAROSCOPIC LOW ANTERIOR RESECTION N/A 12/27/2012   Procedure: LAPAROSCOPIC LOW ANTERIOR RESECTION, COLO-ANAL ANASTOMOSIS, DIVERTING LOOP ILEOSTOMY,SPLENIC FLEXURE MOBILIZATION, PRIMARY INCARCERATED UMBILICAL HERNIA REPAIR;  Surgeon: Adin Hector, MD;  Location: [facility];  Service: General;  Laterality: N/A;  . TEE WITHOUT CARDIOVERSION N/A 07/10/2013   Procedure: TRANSESOPHAGEAL ECHOCARDIOGRAM (TEE);  Surgeon: Candee Furbish, MD;  Location: [facility];  Service: Cardiovascular;  Laterality: N/A;  . TEE WITHOUT CARDIOVERSION N/A 08/21/2013   Procedure: TRANSESOPHAGEAL ECHOCARDIOGRAM (TEE);  Surgeon: Sanda Klein, MD;  Location: [facility] ENDOSCOPY;  Service: Cardiovascular;  Laterality: N/A;  . TONSILLECTOMY     as child  . UMBILICAL HERNIA REPAIR  12/27/2012   Procedure: PRIMARY REPAIR INCARCERATED UMBILICAL HERNIA;  Surgeon: Adin Hector, MD;  Location: [facility];  Service: General;;     Allergies  Allergen Reactions  . Shrimp [Shellfish Allergy] Hives, Nausea And Vomiting and Rash      Family History  Problem Relation Age of Onset  . Cancer Mother        abdominal ca ?ovarian?  . Heart disease Father   . Cancer Sister        liver cancer  . Heart disease Brother   . COPD Brother   . Healthy Son   . Healthy Son      Social History Mr. Faulcon reports that he quit smoking about 45 years ago. His smoking use included cigarettes. He has a 30.00 pack-year smoking history. He has never used smokeless tobacco. Mr. Olivares reports no history of alcohol use.   Review of Systems CONSTITUTIONAL: No weight loss, fever, chills, weakness or fatigue.  HEENT: Eyes: No visual loss, blurred vision, double vision or yellow sclerae.No hearing loss, sneezing, congestion, runny nose or sore throat.  SKIN: No rash or itching.  CARDIOVASCULAR: per hpi RESPIRATORY: No shortness of breath, cough or sputum.  GASTROINTESTINAL: No  anorexia, nausea, vomiting or diarrhea. No abdominal pain or blood.  GENITOURINARY: No burning on urination, no polyuria NEUROLOGICAL: No headache, dizziness, syncope, paralysis, ataxia, numbness or tingling in the extremities. No change in bowel or bladder control.  MUSCULOSKELETAL: No muscle, back pain, joint pain or stiffness.  LYMPHATICS: No enlarged nodes. No history of splenectomy.  PSYCHIATRIC: No history of depression or anxiety.  ENDOCRINOLOGIC: No reports of sweating, cold or heat intolerance. No polyuria or polydipsia.  Marland Kitchen   Physical Examination Today's Vitals   07/12/18 0825  BP: 127/79  Pulse: 74  SpO2: 97%  Weight: 180 lb 3.2 oz (81.7 kg)  Height: 5\' 10"  (1.778 m)   Body mass index is 25.86 kg/m.  Gen: resting comfortably, no acute distress HEENT: no scleral icterus, pupils equal round and reactive,  no palptable cervical adenopathy,  CV: RRR, 3/6 systoilc murmur apex, no jvd Resp: Clear to auscultation bilaterally GI: abdomen is soft, non-tender, non-distended, normal bowel sounds, no hepatosplenomegaly MSK: extremities are warm, no edema.  Skin: warm, no rash Neuro:  no focal deficits Psych: appropriate affect     Assessment and Plan  1. Mitral regurgitation - moderate to severe. Asymptomatic, no evidence of LV enlargement or dysfunction - continue to monitor, repeat echo after our next appt in 6 months   2. HTN - he prefers HCTZ instead of chlorthaldone due to cost, will change to HCTZ 25mg  daily  3. Hyperlipidemia - LDL at goal, continue current meds   EKG today shows SR, no ischemic changes    F/u 6 months. Will need echo after next appt.       Arnoldo Lenis, M.D.

## 2018-07-12 NOTE — Patient Instructions (Signed)
Medication Instructions:   Stop Chlorthalidone.  Begin HCTZ 25mg  daily.   Continue all other medications.    Labwork:   Testing/Procedures:   Follow-Up: Your physician wants you to follow up in: 6 months.  You will receive a reminder letter in the mail one-two months in advance.  If you don't receive a letter, please call our office to schedule the follow up appointment   Any Other Special Instructions Will Be Listed Below (If Applicable).  If you need a refill on your cardiac medications before your next appointment, please call your pharmacy.

## 2018-07-26 ENCOUNTER — Ambulatory Visit: Payer: PPO | Admitting: Family Medicine

## 2018-09-08 ENCOUNTER — Other Ambulatory Visit: Payer: Self-pay | Admitting: Family Medicine

## 2018-09-11 ENCOUNTER — Other Ambulatory Visit: Payer: Self-pay | Admitting: Family Medicine

## 2018-10-14 ENCOUNTER — Other Ambulatory Visit: Payer: Self-pay | Admitting: Family Medicine

## 2018-11-19 ENCOUNTER — Encounter: Payer: Self-pay | Admitting: *Deleted

## 2018-11-19 ENCOUNTER — Ambulatory Visit (INDEPENDENT_AMBULATORY_CARE_PROVIDER_SITE_OTHER): Payer: PPO | Admitting: *Deleted

## 2018-11-19 DIAGNOSIS — Z0000 Encounter for general adult medical examination without abnormal findings: Secondary | ICD-10-CM

## 2018-11-19 NOTE — Progress Notes (Signed)
MEDICARE ANNUAL WELLNESS VISIT  11/19/2018  Telephone Visit Disclaimer This Medicare AWV was conducted by telephone due to national recommendations for restrictions regarding the COVID-19 Pandemic (e.g. social distancing).  I verified, using two identifiers, that I am speaking with Tyler Diaz or their authorized healthcare agent. I discussed the limitations, risks, security, and privacy concerns of performing an evaluation and management service by telephone and the potential availability of an in-person appointment in the future. The patient expressed understanding and agreed to proceed.   Subjective:  Tyler Diaz is a 73 y.o. male patient of Janora Norlander, DO who had a Medicare Annual Wellness Visit today via telephone. Tyler Diaz is retired from [employer]   He has 2 adult children and one grandchild.   One of his sons lives with him.  His wife passed away within the last year.  He reports that he is socially active and does interact with friends/family regularly. He is moderately physically active feeding his chickens and doing yard work, but does not do planned exercise.  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  Patient Care Team: Janora Norlander, DO as PCP - General (Family Medicine) Harl Bowie, Alphonse Guild, MD as PCP - Cardiology (Cardiology) Michael Boston, MD as Consulting Physician (Colon and Rectal Surgery) Wilford Corner, MD as Consulting Physician (Gastroenterology) Gatha Mayer, MD as Consulting Physician (Radiation Oncology) [name], MD as Consulting Physician (Oncology) Carlyle Basques, MD as Consulting Physician (Infectious Diseases) Wilford Corner, MD as Consulting Physician (Gastroenterology) Jacolyn Reedy, MD as Consulting Physician (Cardiology) Harlen Labs, MD as Referring Physician (Optometry)  Advanced Directives 11/19/2018 11/13/2017 11/13/2017 11/11/2016 07/18/2016 01/19/2016 08/13/2015  Does Patient Have a Medical Advance Directive? No  (No Data) No No No No No  Would patient like information on creating a medical advance directive? No - Patient declined - No - Patient declined Yes (MAU/Ambulatory/Procedural Areas - Information given) No - Patient declined No - patient declined information No - patient declined information  Pre-existing out of facility DNR order (yellow form or pink MOST form) - - - - - - -    Hospital Utilization Over the Past 12 Months: # of hospitalizations or ER visits: 0 # of surgeries: 0  Review of Systems    Patient reports that his overall health is unchanged compared to last year.   Review of Systems:  Musculoskeletal - positive for right shoulder and bilateral knee pain  All other systems negative.  Pain Assessment Pain Score: 2      Current Medications & Allergies (verified) Allergies as of 11/19/2018      Reactions   Shrimp [shellfish Allergy] Hives, Nausea And Vomiting, Rash      Medication List       Accurate as of November 19, 2018  3:49 PM. If you have any questions, ask your nurse or doctor.        acetaminophen 500 MG tablet Commonly known as: TYLENOL Take 500 mg by mouth every 6 (six) hours as needed for headache.   DIGESTIVE ADVANTAGE PO Take 1 tablet by mouth daily.   hydrochlorothiazide 25 MG tablet Commonly known as: HYDRODIURIL Take 1 tablet (25 mg total) by mouth daily.   ketoconazole 2 % cream Commonly known as: NIZORAL APPLY TOPICALLY TWICE DAILY   lisinopril 40 MG tablet Commonly known as: ZESTRIL Take 1 tablet (40 mg total) by mouth daily.   multivitamin with minerals Tabs tablet Take 1 tablet by mouth every morning.  oxymetazoline 0.05 % nasal spray Commonly known as: AFRIN Place 1 spray into both nostrils 2 (two) times daily as needed for congestion.   pravastatin 20 MG tablet Commonly known as: PRAVACHOL Take 1 tablet (20 mg total) by mouth daily.   triamcinolone cream 0.1 % Commonly known as: KENALOG APPLY TOPICALLY 3 TIMES DAILY        History (reviewed): Past Medical History:  Diagnosis Date  . Anemia    hx of  . Arthritis    hands/knees  . Blood clot in vein    RT ARM WITH PICC LINE   . C. difficile colitis 08/19/2013  . Colon cancer ([place]) 07/18/12 bx   rectum=Invasive adenocarcinoma w/ extracellular mucin,polyp=benign  . GERD (gastroesophageal reflux disease)    OCCASIONAL  . Hearing loss    partial greater on left  . Heart murmur   . Hematochezia    recent  . Herpes zoster - R flank - with severe pain 03/11/2013  . History of shingles   . Hyperlipidemia   . Hypertension   . Hypothyroidism as child   hx of  . Ileostomy - diverting loop - s/p takedown 06/28/2013 01/14/2013  . Mitral regurgitation due to partially flail posterior mitral valve leaflet   . Nocturia   . Numbness    TOES / FINGERS DUE TO CHEMO  . Peripheral vascular disease (HCC)    neuropathy due to cancer treatment (fingers and toes)  . Radiation    FOR COLON CANCER  . Rectal cancer ([place])   . Rheumatic fever as child   hx  . Sinusitis    seasonal  . Tinnitus    left ear  . Umbilical hernia, incarcerated - s/p primary repair July 2014 01/03/2013   Past Surgical History:  Procedure Laterality Date  . CARPAL TUNNEL RELEASE Right 2002  . ILEOSTOMY N/A 12/27/2012   Procedure: DIVERTING LOOP ILEOSTOMY;  Surgeon: Adin Hector, MD;  Location: [facility];  Service: General;  Laterality: N/A;  . ILEOSTOMY CLOSURE N/A 06/28/2013   Procedure: loop ILEOSTOMY TAKEDOWN examination of anesthesia ;  Surgeon: Adin Hector, MD;  Location: [facility];  Service: General;  Laterality: N/A;  . INGUINAL HERNIA REPAIR Left   . INGUINAL HERNIA REPAIR Bilateral 08/13/2015   Procedure: LAPAROSCOPIC BILATERAL INGUINAL HERNIA REPAIR WITH MESH;  Surgeon: Michael Boston, MD;  Location: [facility];  Service: General;  Laterality: Bilateral;  . INSERTION OF MESH Bilateral 08/13/2015   Procedure: INSERTION OF MESH;  Surgeon: Michael Boston, MD;  Location: [facility];  Service:  General;  Laterality: Bilateral;  . LAPAROSCOPIC LOW ANTERIOR RESCECTION WITH COLOANAL ANASTOMOSIS N/A 12/27/2012   Procedure: LAPAROSCOPIC LOW ANTERIOR RESCECTION WITH COLOANAL ANASTOMOSIS;  Surgeon: Adin Hector, MD;  Location: [facility];  Service: General;  Laterality: N/A;  . LAPAROSCOPIC LOW ANTERIOR RESECTION N/A 12/27/2012   Procedure: LAPAROSCOPIC LOW ANTERIOR RESECTION, COLO-ANAL ANASTOMOSIS, DIVERTING LOOP ILEOSTOMY,SPLENIC FLEXURE MOBILIZATION, PRIMARY INCARCERATED UMBILICAL HERNIA REPAIR;  Surgeon: Adin Hector, MD;  Location: [facility];  Service: General;  Laterality: N/A;  . TEE WITHOUT CARDIOVERSION N/A 07/10/2013   Procedure: TRANSESOPHAGEAL ECHOCARDIOGRAM (TEE);  Surgeon: Candee Furbish, MD;  Location: [facility] ENDOSCOPY;  Service: Cardiovascular;  Laterality: N/A;  . TEE WITHOUT CARDIOVERSION N/A 08/21/2013   Procedure: TRANSESOPHAGEAL ECHOCARDIOGRAM (TEE);  Surgeon: Sanda Klein, MD;  Location: [facility] ENDOSCOPY;  Service: Cardiovascular;  Laterality: N/A;  . TONSILLECTOMY     as child  . UMBILICAL HERNIA REPAIR  12/27/2012   Procedure: PRIMARY REPAIR INCARCERATED UMBILICAL  HERNIA;  Surgeon: Adin Hector, MD;  Location: [facility];  Service: General;;   Family History  Problem Relation Age of Onset  . Cancer Mother        abdominal ca ?ovarian?  . Heart disease Father   . Cancer Sister        liver cancer  . Heart disease Brother   . COPD Brother   . Healthy Son   . Healthy Son    Social History   Socioeconomic History  . Marital status: Widowed    Spouse name: Not on file  . Number of children: 2  . Years of education: 10  . Highest education level: 10th grade  Occupational History  . 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 Financial resource strain: Not hard at all  . Food insecurity    Worry: Never true    Inability: Never true  . Transportation needs    Medical: No    Non-medical: No  Tobacco Use  .  Smoking status: Former Smoker    Packs/day: 1.50    Years: 20.00    Pack years: 30.00    Types: Cigarettes    Quit date: 07/27/1972    Years since quitting: 46.3  . Smokeless tobacco: Never Used  Substance and Sexual Activity  . Alcohol use: No  . Drug use: No  . Sexual activity: Yes  Lifestyle  . Physical activity    Days per week: 0 days    Minutes per session: 0 min  . Stress: Only a little  Relationships  . Social connections    Talks on phone: More than three times a week    Gets together: More than three times a week    Attends religious service: Never    Active member of club or organization: No    Attends meetings of clubs or organizations: Never    Relationship status: Married  Other Topics Concern  . 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  Married and lives at home with his wife. Has two adult children. One son lives with them.     Activities of Daily Living In your present state of health, do you have any difficulty performing the following activities: 11/19/2018  Hearing? Y  Vision? N  Difficulty concentrating or making decisions? N  Walking or climbing stairs? N  Dressing or bathing? N  Doing errands, shopping? N  Preparing Food and eating ? N  Using the Toilet? N  In the past six months, have you accidently leaked urine? N  Do you have problems with loss of bowel control? Y  Comment occassionally urgency since rectal cancer   Managing your Medications? N  Managing your Finances? N  Housekeeping or managing your Housekeeping? N  Some recent data might be hidden    Patient Literacy    Exercise Current Exercise Habits: The patient does not participate in regular exercise at present, Exercise limited by: orthopedic condition(s)  Diet Patient reports consuming 2 meals a day and 2 snack(s) a day Patient reports that his primary diet is: Regular Patient reports that she does have regular access to food.    Depression Screen PHQ 2/9 Scores 11/19/2018 05/22/2018 01/23/2018 11/13/2017 07/26/2017 01/20/2017 11/11/2016  PHQ - 2 Score 1 0 0 0 0 2 0  PHQ- 9 Score - - - - - 4 -  Fall Risk Fall Risk  11/19/2018 05/22/2018 01/23/2018 11/13/2017 07/26/2017  Falls in the past year? 0 0 No No No     Objective:  Tyler Diaz seemed alert and oriented and he participated appropriately during our telephone visit.  Blood Pressure Weight BMI  BP Readings from Last 3 Encounters:  07/12/18 127/79  05/22/18 132/76  01/23/18 125/64   Wt Readings from Last 3 Encounters:  07/12/18 180 lb 3.2 oz (81.7 kg)  05/22/18 177 lb 6.4 oz (80.5 kg)  01/23/18 177 lb (80.3 kg)   BMI Readings from Last 1 Encounters:  07/12/18 25.86 kg/m    *Unable to obtain current vital signs, weight, and BMI due to telephone visit type  Hearing/Vision  . Tyler Diaz did not seem to have difficulty with hearing/understanding during the telephone conversation . Reports that he has not had a formal eye exam by an eye care professional within the past year . Reports that he has not had a formal hearing evaluation within the past year *Unable to fully assess hearing and vision during telephone visit type  Cognitive Function: 6CIT Screen 11/19/2018  What Year? 0 points  What month? 0 points  What time? 0 points  Count back from 20 0 points  Months in reverse 0 points  Repeat phrase 0 points  Total Score 0   (Normal:0-7, Significant for Dysfunction: >8)  Normal Cognitive Function Screening: Yes   Immunization & Health Maintenance Record Immunization History  Administered Date(s) Administered  . Influenza, High Dose Seasonal PF 03/16/2018  . Influenza,inj,Quad PF,6+ Mos 05/21/2013, 03/13/2014, 03/10/2015, 03/14/2016, 03/08/2017  . Pneumococcal Polysaccharide-23 05/21/2013    Health Maintenance  Topic Date Due  . Samul Dada  09/11/1964  . INFLUENZA VACCINE  01/05/2019  . COLONOSCOPY  12/05/2023  . Hepatitis C Screening   Completed  . PNA vac Low Risk Adult  Completed       Assessment  This is a routine wellness examination for Tyler Diaz.  Health Maintenance: Due or Overdue Health Maintenance Due  Topic Date Due  . TETANUS/TDAP  09/11/1964   Recommed tdap at next in person visit   Tyler Diaz does not need a referral for Community Assistance: Care Management:   no Social Work:    no Prescription Assistance:  no Nutrition/Diabetes Education:  no   Plan:  Personalized Goals Goals Addressed  Goals    . Exercise 150 min/wk Moderate Activity - walking is a great option                                  None    Personalized Health Maintenance & Screening Recommendations  Td vaccine Advanced directives: has NO advanced directive - not interested in additional information  Shingrix Vaccine series   Lung Cancer Screening Recommended: no (Low Dose CT Chest recommended if Age 55-80 years, 30 pack-year currently smoking OR have quit w/in past 15 years) Hepatitis C Screening recommended: completed 05/22/2018   Advanced Directives: Written information was not prepared per patient's request.  Referrals & Orders   Follow-up Plan . Follow-up with Janora Norlander, DO as planned . Schedule eye exam . Work on goal of increasing planned exercise to 150 minutes per week    I have personally reviewed and noted the following in the patient's chart:   . Medical and social history . Use of alcohol, tobacco or illicit drugs  . Current medications and supplements . Functional  ability and status . Nutritional status . Physical activity . Advanced directives . List of other physicians . Hospitalizations, surgeries, and ER visits in previous 12 months . Vitals . Screenings to include cognitive, depression, and falls . Referrals and appointments  In addition, I have reviewed and discussed with Tyler Diaz certain preventive protocols, quality metrics, and best practice  recommendations. A written personalized care plan for preventive services as well as general preventive health recommendations is available and can be mailed to the patient at his request.      Nolberto Hanlon, RN  11/19/2018

## 2018-11-19 NOTE — Patient Instructions (Signed)
  Tyler Diaz , Thank you for taking time to talk with me for your Medicare Annual  Wellness Visit. I appreciate your ongoing commitment to your health goals. Please review the following plan we discussed and let me know if I can assist you in the future.   These are the goals we discussed: Goals    . Exercise 150 min/wk Moderate Activity- walking is a great option                                  This is a list of the screening recommended for you and due dates:  Health Maintenance  Topic Date Due  . Tetanus Vaccine  09/11/1964  . Flu Shot  01/05/2019  . Colon Cancer Screening  12/05/2023  .  Hepatitis C: One time screening is recommended by Center for Disease Control  (CDC) for  adults born from [birth year] through 1967.   Completed  . Pneumonia vaccines  Completed

## 2018-11-20 ENCOUNTER — Other Ambulatory Visit: Payer: Self-pay

## 2018-11-21 ENCOUNTER — Encounter: Payer: Self-pay | Admitting: Family Medicine

## 2018-11-21 ENCOUNTER — Ambulatory Visit (INDEPENDENT_AMBULATORY_CARE_PROVIDER_SITE_OTHER): Payer: PPO | Admitting: Family Medicine

## 2018-11-21 ENCOUNTER — Ambulatory Visit: Payer: PPO | Admitting: Pediatrics

## 2018-11-21 VITALS — BP 138/82 | HR 74 | Temp 97.3°F | Ht 70.0 in | Wt 184.0 lb

## 2018-11-21 DIAGNOSIS — I10 Essential (primary) hypertension: Secondary | ICD-10-CM | POA: Diagnosis not present

## 2018-11-21 DIAGNOSIS — C20 Malignant neoplasm of rectum: Secondary | ICD-10-CM | POA: Diagnosis not present

## 2018-11-21 DIAGNOSIS — R7989 Other specified abnormal findings of blood chemistry: Secondary | ICD-10-CM | POA: Diagnosis not present

## 2018-11-21 DIAGNOSIS — D649 Anemia, unspecified: Secondary | ICD-10-CM

## 2018-11-21 DIAGNOSIS — M150 Primary generalized (osteo)arthritis: Secondary | ICD-10-CM | POA: Diagnosis not present

## 2018-11-21 DIAGNOSIS — M159 Polyosteoarthritis, unspecified: Secondary | ICD-10-CM

## 2018-11-21 MED ORDER — DICLOFENAC SODIUM 1 % TD GEL: 4.0000 g | Freq: Four times a day (QID) | TRANSDERMAL | 2 refills | Status: DC

## 2018-11-21 MED ORDER — LISINOPRIL 40 MG PO TABS: 40.0000 mg | ORAL_TABLET | Freq: Every day | ORAL | 1 refills | Status: DC

## 2018-11-21 NOTE — Patient Instructions (Signed)
I have added diclofenac gel for your arthritis.  I want you to discuss your bowel concerns with your gastric doctor.  The medication I was considering for you is too high risk given your other medical problems.  They may have some other ideas on how to help you regulate your bowel movements.   Arthritis Arthritis means joint pain. It can also mean joint disease. A joint is a place where bones come together. People who have arthritis may have:  Red joints.  Swollen joints.  Stiff joints.  Warm joints.  A fever.  A feeling of being sick. Follow these instructions at home: Pay attention to any changes in your symptoms. Take these actions to help with your pain and swelling. Medicines  Take over-the-counter and prescription medicines only as told by your doctor.  Do not take aspirin for pain if your doctor says that you may have gout. Activity  Rest your joint if your doctor tells you to.  Avoid activities that make the pain worse.  Exercise your joint regularly as told by your doctor. Try doing exercises like: ? Swimming. ? Water aerobics. ? Biking. ? Walking. Joint Care   If your joint is swollen, keep it raised (elevated) if told by your doctor.  If your joint feels stiff in the morning, try taking a warm shower.  If you have diabetes, do not apply heat without asking your doctor.  If told, apply heat to the joint: ? Put a towel between the joint and the hot pack or heating pad. ? Leave the heat on the area for 20-30 minutes.  If told, apply ice to the joint: ? Put ice in a plastic bag. ? Place a towel between your skin and the bag. ? Leave the ice on for 20 minutes, 2-3 times per day.  Keep all follow-up visits as told by your doctor. Contact a doctor if:  The pain gets worse.  You have a fever. Get help right away if:  You have very bad pain in your joint.  You have swelling in your joint.  Your joint is red.  Many joints become painful and  swollen.  You have very bad back pain.  Your leg is very weak.  You cannot control your pee (urine) or poop (stool). This information is not intended to replace advice given to you by your health care provider. Make sure you discuss any questions you have with your health care provider. Document Released: 08/17/2009 Document Revised: 10/29/2015 Document Reviewed: 08/18/2014 Elsevier Interactive Patient Education  2019 [employer].

## 2018-11-21 NOTE — Progress Notes (Signed)
Subjective: CC: Follow-up hypertension, osteoarthritis PCP: Janora Norlander, DO Tyler Diaz is a 73 y.o. male presenting to clinic today for:  1.  Hypertension Patient reports compliance with lisinopril 40 mg daily.  No chest pain, shortness of breath, dizziness.  No loss of consciousness or falls.  2.  Osteoarthritis Patient with known osteoarthritis.  He notes he is had corticosteroid injections bilateral knees before.  Over the last 2 to 3 weeks, he has had some difficulty with raising his right upper extremity above his head secondary to pain.  He is able to do this but does note that it "catches".  He points to the posterior aspect of the shoulder as the source of pain.  It is not sustained and often resolves independently.  He is wondering if there is anything he can do to help with the symptoms.  Currently takes Tylenol.  He does report some weakness, particularly with lifting objects like a coffee cup.  Denies any sensation changes.  3.  Diarrhea Patient reports that he has "blow outs" when he has to go away from his home for prolonged amount of time.  He often will pretreat himself with Imodium which does seem to help but he is wondering if there is something prescription.  He notes he has quite a bit of difficulty getting the Imodium doubt of the fall sealed packets.  Denies any hematochezia or melena.  He has history of colon cancer status post resection.  ROS: Per HPI  Allergies  Allergen Reactions  . Shrimp [Shellfish Allergy] Hives, Nausea And Vomiting and Rash   Past Medical History:  Diagnosis Date  . Anemia    hx of  . Arthritis    hands/knees  . Blood clot in vein    RT ARM WITH PICC LINE   . C. difficile colitis 08/19/2013  . Colon cancer ([place]) 07/18/12 bx   rectum=Invasive adenocarcinoma w/ extracellular mucin,polyp=benign  . GERD (gastroesophageal reflux disease)    OCCASIONAL  . Hearing loss    partial greater on left  . Heart murmur   .  Hematochezia    recent  . Herpes zoster - R flank - with severe pain 03/11/2013  . History of shingles   . Hyperlipidemia   . Hypertension   . Hypothyroidism as child   hx of  . Ileostomy - diverting loop - s/p takedown 06/28/2013 01/14/2013  . Mitral regurgitation due to partially flail posterior mitral valve leaflet   . Nocturia   . Numbness    TOES / FINGERS DUE TO CHEMO  . Peripheral vascular disease (HCC)    neuropathy due to cancer treatment (fingers and toes)  . Radiation    FOR COLON CANCER  . Rectal cancer ([place])   . Rheumatic fever as child   hx  . Sinusitis    seasonal  . Tinnitus    left ear  . Umbilical hernia, incarcerated - s/p primary repair July 2014 01/03/2013    Current Outpatient Medications:  .  acetaminophen (TYLENOL) 500 MG tablet, Take 500 mg by mouth every 6 (six) hours as needed for headache., Disp: , Rfl:  .  hydrochlorothiazide (HYDRODIURIL) 25 MG tablet, Take 1 tablet (25 mg total) by mouth daily., Disp: 30 tablet, Rfl: 6 .  ketoconazole (NIZORAL) 2 % cream, APPLY TOPICALLY TWICE DAILY, Disp: 60 g, Rfl: 0 .  lisinopril (PRINIVIL,ZESTRIL) 40 MG tablet, Take 1 tablet (40 mg total) by mouth daily., Disp: 90 tablet, Rfl: 1 .  Multiple  Vitamin (MULTIVITAMIN WITH MINERALS) TABS, Take 1 tablet by mouth every morning. , Disp: , Rfl:  .  oxymetazoline (AFRIN) 0.05 % nasal spray, Place 1 spray into both nostrils 2 (two) times daily as needed for congestion., Disp: , Rfl:  .  pravastatin (PRAVACHOL) 20 MG tablet, Take 1 tablet (20 mg total) by mouth daily., Disp: 90 tablet, Rfl: 3 .  Probiotic Product (DIGESTIVE ADVANTAGE PO), Take 1 tablet by mouth daily., Disp: , Rfl:  .  triamcinolone cream (KENALOG) 0.1 %, APPLY TOPICALLY 3 TIMES DAILY, Disp: 80 g, Rfl: 0 Social History   Socioeconomic History  . Marital status: Widowed    Spouse name: Not on file  . Number of children: 2  . Years of education: 10  . Highest education level: 10th grade  Occupational  History  . 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 Financial resource strain: Not hard at all  . Food insecurity    Worry: Never true    Inability: Never true  . Transportation needs    Medical: No    Non-medical: No  Tobacco Use  . Smoking status: Former Smoker    Packs/day: 1.50    Years: 20.00    Pack years: 30.00    Types: Cigarettes    Quit date: 07/27/1972    Years since quitting: 46.3  . Smokeless tobacco: Never Used  Substance and Sexual Activity  . Alcohol use: No  . Drug use: No  . Sexual activity: Not Currently  Lifestyle  . Physical activity    Days per week: 0 days    Minutes per session: 0 min  . Stress: Only a little  Relationships  . Social connections    Talks on phone: More than three times a week    Gets together: More than three times a week    Attends religious service: Never    Active member of club or organization: No    Attends meetings of clubs or organizations: Never    Relationship status: Married  . Intimate partner violence    Fear of current or ex partner: No    Emotionally abused: No    Physically abused: No    Forced sexual activity: No  Other Topics Concern  . 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  Has two adult children and one grandchild. One son lives with him.    Family History  Problem Relation Age of Onset  . Cancer Mother        abdominal ca ?ovarian?  . Heart disease Father   . Cancer Sister        liver cancer  . Heart disease Brother   . COPD Brother   . Healthy Son   . Healthy Son     Objective: Office vital signs reviewed. BP 138/82   Pulse 74   Temp (!) 97.3 F (36.3 C) (Oral)   Ht 5\' 10"  (1.778 m)   Wt 184 lb (83.5 kg)   BMI 26.40 kg/m   Physical Examination:  General: Awake, alert, well nourished, No acute distress HEENT: Normal, sclera white, MMM Cardio: regular rate and rhythm,  S1S2 heard, blowing systolic murmur noted along bilateral sternal borders. Pulm: clear to auscultation bilaterally, no wheezes, rhonchi or rales; normal work of breathing on room air Extremities: warm, well perfused, No edema, cyanosis or clubbing; +2 pulses bilaterally  MSK: normal gait and station  Right upper extremity.  Has full active range of motion all planes.  No tenderness palpation to the shoulder.  No palpable abnormalities.  He has mild weakness with empty can.  Mild pain with [place]. Skin: dry; intact; no rashes or lesions Neuro: 5/5 UE Strength and light touch sensation grossly intact  Assessment/ Plan: 73 y.o. male   1. Primary osteoarthritis involving multiple joints Trial of Voltaren gel.  If he has ongoing issues we can consider corticosteroid injection.  I suspect rotator cuff involvement given positive empty can. - diclofenac sodium (VOLTAREN) 1 % GEL; Apply 4 g topically 4 (four) times daily. (for joint pain)  Dispense: 400 g; Refill: 2  2. Elevated serum creatinine Check BMP - Basic Metabolic Panel  3. Anemia, unspecified type Check CBC - CBC  4. Essential hypertension Controlled.  Continue current regimen.  Refill sent - lisinopril (ZESTRIL) 40 MG tablet; Take 1 tablet (40 mg total) by mouth daily.  Dispense: 90 tablet; Refill: 1  5. Rectal cancer - distal s/p lap LAR/coloanal July 2014 - [id number]. Has been released from oncology.  He continues to follow-up with gastroenterology intervally.  Has some issues with bowel incontinence that is relieved by intermittent use of Imodium.  Caution constipation and SBO with this medication.   Orders Placed This Encounter  Procedures  . Basic Metabolic Panel  . CBC   Meds ordered this encounter  Medications  . diclofenac sodium (VOLTAREN) 1 % GEL    Sig: Apply 4 g topically 4 (four) times daily. (for joint pain)    Dispense:  400 g    Refill:  2  . lisinopril (ZESTRIL) 40 MG tablet    Sig: Take 1 tablet (40 mg  total) by mouth daily.    Dispense:  90 tablet    Refill:  1    Please consider 90 day supplies to promote better adherence     Janora Norlander, DO [facility] [phone]

## 2018-11-22 LAB — BASIC METABOLIC PANEL
BUN/Creatinine Ratio: 23 (ref 10–24)
BUN: 33 mg/dL — ABNORMAL HIGH (ref 8–27)
CO2: 21 mmol/L (ref 20–29)
Calcium: 9.4 mg/dL (ref 8.6–10.2)
Chloride: 103 mmol/L (ref 96–106)
Creatinine, Ser: 1.44 mg/dL — ABNORMAL HIGH (ref 0.76–1.27)
GFR calc Af Amer: 55 mL/min/{1.73_m2} — ABNORMAL LOW (ref >59)
GFR calc non Af Amer: 47 mL/min/{1.73_m2} — ABNORMAL LOW (ref >59)
Glucose: 90 mg/dL (ref 65–99)
Potassium: 4.9 mmol/L (ref 3.5–5.2)
Sodium: 138 mmol/L (ref 134–144)

## 2018-11-22 LAB — CBC
Hematocrit: 36.5 % — ABNORMAL LOW (ref 37.5–51.0)
Hemoglobin: 12.7 g/dL — ABNORMAL LOW (ref 13.0–17.7)
MCH: 34.3 pg — ABNORMAL HIGH (ref 26.6–33.0)
MCHC: 34.8 g/dL (ref 31.5–35.7)
MCV: 99 fL — ABNORMAL HIGH (ref 79–97)
Platelets: 171 10*3/uL (ref 150–450)
RBC: 3.7 x10E6/uL — ABNORMAL LOW (ref 4.14–5.80)
RDW: 13.2 % (ref 11.6–15.4)
WBC: 8 10*3/uL (ref 3.4–10.8)

## 2019-01-17 ENCOUNTER — Other Ambulatory Visit: Payer: Self-pay | Admitting: Family Medicine

## 2019-01-31 ENCOUNTER — Telehealth: Payer: Self-pay | Admitting: *Deleted

## 2019-01-31 NOTE — Telephone Encounter (Signed)
Patient verbally consented for telehealth visits with [facility] and understands that his insurance company will be billed for the encounter.   Aware to have vitals available 

## 2019-02-04 ENCOUNTER — Telehealth (INDEPENDENT_AMBULATORY_CARE_PROVIDER_SITE_OTHER): Payer: PPO | Admitting: Cardiology

## 2019-02-04 ENCOUNTER — Encounter: Payer: Self-pay | Admitting: Cardiology

## 2019-02-04 VITALS — BP 107/65 | HR 76 | Ht 70.0 in | Wt 183.0 lb

## 2019-02-04 DIAGNOSIS — I340 Nonrheumatic mitral (valve) insufficiency: Secondary | ICD-10-CM

## 2019-02-04 DIAGNOSIS — I10 Essential (primary) hypertension: Secondary | ICD-10-CM

## 2019-02-04 DIAGNOSIS — E782 Mixed hyperlipidemia: Secondary | ICD-10-CM

## 2019-02-04 NOTE — Progress Notes (Signed)
Virtual Visit via Telephone Note   This visit type was conducted due to national recommendations for restrictions regarding the COVID-19 Pandemic (e.g. social distancing) in an effort to limit this patient's exposure and mitigate transmission in our community.  Due to his co-morbid illnesses, this patient is at least at moderate risk for complications without adequate follow up.  This format is felt to be most appropriate for this patient at this time.  The patient did not have access to video technology/had technical difficulties with video requiring transitioning to audio format only (telephone).  All issues noted in this document were discussed and addressed.  No physical exam could be performed with this format.  Please refer to the patient's chart for his  consent to telehealth for [facility].   Date:  02/04/2019   ID:  Tyler Diaz, DOB Mar 07, 1946, MRN [id number]  Patient Location: Home Provider Location: Office  PCP:  Janora Norlander, DO  Cardiologist:  Carlyle Dolly, MD  Electrophysiologist:  None   Evaluation Performed:  Follow-Up Visit  Chief Complaint:  Follow up visit  History of Present Illness:    Tyler Diaz is a 73 y.o. male seen today for follow up of the following medical problems.    1. Mitral regurgitation - 11/2017 echo LVEF 60%, moderate to severe MR. Partial flail cusp of posterior leaflet. LVIDs 3.6 - 08/2013 TEE flail segment of P2 scallop posterior leaflet due to ruptured chordae, moderate to severe MR   - no recent SOB/DOE. No recent edema - remains active, mainly doing yardwork which he notices no change in his abilty to do.   2. HTN  - compliant with meds  3. Hyperlipidemia - 01/2018 TC 142 TG 173 HDL 37 LDL 70 - labs followed by pcp  4. Colorectal cancer - s/p surgery, currently cancer free.   5. AKI - recent uptrend in Cr followed by pcp - has been having him increase his hydration, due to repeat labs at there next f/u    The patient does not have symptoms concerning for COVID-19 infection (fever, chills, cough, or new shortness of breath).    Past Medical History:  Diagnosis Date  . Anemia    hx of  . Arthritis    hands/knees  . Blood clot in vein    RT ARM WITH PICC LINE   . C. difficile colitis 08/19/2013  . Colon cancer ([place]) 07/18/12 bx   rectum=Invasive adenocarcinoma w/ extracellular mucin,polyp=benign  . GERD (gastroesophageal reflux disease)    OCCASIONAL  . Hearing loss    partial greater on left  . Heart murmur   . Hematochezia    recent  . Herpes zoster - R flank - with severe pain 03/11/2013  . History of shingles   . Hyperlipidemia   . Hypertension   . Hypothyroidism as child   hx of  . Ileostomy - diverting loop - s/p takedown 06/28/2013 01/14/2013  . Mitral regurgitation due to partially flail posterior mitral valve leaflet   . Nocturia   . Numbness    TOES / FINGERS DUE TO CHEMO  . Peripheral vascular disease (HCC)    neuropathy due to cancer treatment (fingers and toes)  . Radiation    FOR COLON CANCER  . Rectal cancer ([place])   . Rheumatic fever as child   hx  . Sinusitis    seasonal  . Tinnitus    left ear  . Umbilical hernia, incarcerated - s/p primary repair July 2014 01/03/2013  Past Surgical History:  Procedure Laterality Date  . CARPAL TUNNEL RELEASE Right 2002  . ILEOSTOMY N/A 12/27/2012   Procedure: DIVERTING LOOP ILEOSTOMY;  Surgeon: Adin Hector, MD;  Location: [facility];  Service: General;  Laterality: N/A;  . ILEOSTOMY CLOSURE N/A 06/28/2013   Procedure: loop ILEOSTOMY TAKEDOWN examination of anesthesia ;  Surgeon: Adin Hector, MD;  Location: [facility];  Service: General;  Laterality: N/A;  . INGUINAL HERNIA REPAIR Left   . INGUINAL HERNIA REPAIR Bilateral 08/13/2015   Procedure: LAPAROSCOPIC BILATERAL INGUINAL HERNIA REPAIR WITH MESH;  Surgeon: Michael Boston, MD;  Location: [facility];  Service: General;  Laterality: Bilateral;  . INSERTION OF MESH Bilateral  08/13/2015   Procedure: INSERTION OF MESH;  Surgeon: Michael Boston, MD;  Location: [facility];  Service: General;  Laterality: Bilateral;  . LAPAROSCOPIC LOW ANTERIOR RESCECTION WITH COLOANAL ANASTOMOSIS N/A 12/27/2012   Procedure: LAPAROSCOPIC LOW ANTERIOR RESCECTION WITH COLOANAL ANASTOMOSIS;  Surgeon: Adin Hector, MD;  Location: [facility];  Service: General;  Laterality: N/A;  . LAPAROSCOPIC LOW ANTERIOR RESECTION N/A 12/27/2012   Procedure: LAPAROSCOPIC LOW ANTERIOR RESECTION, COLO-ANAL ANASTOMOSIS, DIVERTING LOOP ILEOSTOMY,SPLENIC FLEXURE MOBILIZATION, PRIMARY INCARCERATED UMBILICAL HERNIA REPAIR;  Surgeon: Adin Hector, MD;  Location: [facility];  Service: General;  Laterality: N/A;  . TEE WITHOUT CARDIOVERSION N/A 07/10/2013   Procedure: TRANSESOPHAGEAL ECHOCARDIOGRAM (TEE);  Surgeon: Candee Furbish, MD;  Location[facility] ENDOSCOPY;  Service: Cardiovascular;  Laterality: N/A;  . TEE WITHOUT CARDIOVERSION N/A 08/21/2013   Procedure: TRANSESOPHAGEAL ECHOCARDIOGRAM (TEE);  Surgeon: Sanda Klein, MD;  [facility];  Service: Cardiovascular;  Laterality: N/A;  . TONSILLECTOMY     as child  . UMBILICAL HERNIA REPAIR  12/27/2012   Procedure: PRIMARY REPAIR INCARCERATED UMBILICAL HERNIA;  Surgeon: Adin Hector, MD;  Location: [facility];  Service: General;;     Current Meds  Medication Sig  . acetaminophen (TYLENOL) 500 MG tablet Take 500 mg by mouth every 6 (six) hours as needed for headache.  . diclofenac sodium (VOLTAREN) 1 % GEL Apply 4 g topically 4 (four) times daily. (for joint pain)  . hydrochlorothiazide (HYDRODIURIL) 25 MG tablet Take 1 tablet (25 mg total) by mouth daily.  Marland Kitchen ketoconazole (NIZORAL) 2 % cream APPLY TOPICALLY TWICE DAILY  . lisinopril (ZESTRIL) 40 MG tablet Take 1 tablet (40 mg total) by mouth daily.  . Multiple Vitamin (MULTIVITAMIN WITH MINERALS) TABS Take 1 tablet by mouth every morning.   Marland Kitchen oxymetazoline (AFRIN) 0.05 % nasal spray Place 1 spray into both nostrils 2 (two)  times daily as needed for congestion.  . pravastatin (PRAVACHOL) 20 MG tablet Take 1 tablet (20 mg total) by mouth daily.  . Probiotic Product (DIGESTIVE ADVANTAGE PO) Take 1 tablet by mouth daily.  Marland Kitchen triamcinolone cream (KENALOG) 0.1 % APPLY TOPICALLY THREE TIMES DAILY     Allergies:   Shrimp [shellfish allergy]   Social History   Tobacco Use  . Smoking status: Former Smoker    Packs/day: 1.50    Years: 20.00    Pack years: 30.00    Types: Cigarettes    Quit date: 07/27/1972    Years since quitting: 46.5  . Smokeless tobacco: Never Used  Substance Use Topics  . Alcohol use: No  . Drug use: No     Family Hx: The patient's family history includes COPD in his brother; Cancer in his mother and sister; Healthy in his son and son; Heart disease in his brother and father.  ROS:  Please see the history of present illness.    All other systems reviewed and are negative.   Prior CV studies:   The following studies were reviewed today:  Labs/Other Tests and Data Reviewed:    EKG:  No ECG reviewed.  Recent Labs: 11/21/2018: BUN 33; Creatinine, Ser 1.44; Hemoglobin 12.7; Platelets 171; Potassium 4.9; Sodium 138   Recent Lipid Panel Lab Results  Component Value Date/Time   CHOL 142 01/23/2018 11:19 AM   TRIG 173 (H) 01/23/2018 11:19 AM   HDL 37 (L) 01/23/2018 11:19 AM   CHOLHDL 3.8 01/23/2018 11:19 AM   LDLCALC 70 01/23/2018 11:19 AM    Wt Readings from Last 3 Encounters:  02/04/19 183 lb (83 kg)  11/21/18 184 lb (83.5 kg)  07/12/18 180 lb 3.2 oz (81.7 kg)     Objective:    Vital Signs:  BP 107/65   Pulse 76   Ht 5\' 10"  (1.778 m)   Wt 183 lb (83 kg)   BMI 26.26 kg/m    Normal affect. Normal speech pattern and tone. Comfortable, no apparent distress. No audible signs of SOB or wheezing.   ASSESSMENT & PLAN:    1. Mitral regurgitation - moderate to severe. Asymptomatic, no evidence of LV enlargement or dysfunction - we will repeat his echo. Due to prior  anatomy if indicated I think he may qualify for MV repair as opposed to replacement.    2. HTN - he prefers HCTZ instead of chlorthaldone due to cost - bp at goal - if further worsening of renal function would come off HCTZ and start norvasc  3. Hyperlipidemia - continue statin    COVID-19 Education: The signs and symptoms of COVID-19 were discussed with the patient and how to seek care for testing (follow up with PCP or arrange E-visit).  The importance of social distancing was discussed today.  Time:   Today, I have spent 23 minutes with the patient with telehealth technology discussing the above problems.     Medication Adjustments/Labs and Tests Ordered: Current medicines are reviewed at length with the patient today.  Concerns regarding medicines are outlined above.   Tests Ordered: No orders of the defined types were placed in this encounter.   Medication Changes: No orders of the defined types were placed in this encounter.   Follow Up:  In Person in 6 month(s)  Signed, Carlyle Dolly, MD  02/04/2019 10:36 AM    [place]

## 2019-02-04 NOTE — Addendum Note (Signed)
Addended by: Julian Hy T on: 02/04/2019 11:02 AM   Modules accepted: Orders

## 2019-02-04 NOTE — Patient Instructions (Signed)
Your physician wants you to follow-up in: 6 MONTHS WITH DR. BRANCH You will receive a reminder letter in the mail two months in advance. If you don't receive a letter, please call our office to schedule the follow-up appointment.  Your physician recommends that you continue on your current medications as directed. Please refer to the Current Medication list given to you today.  Your physician has requested that you have an echocardiogram. Echocardiography is a painless test that uses sound waves to create images of your heart. It provides your doctor with information about the size and shape of your heart and how well your heart's chambers and valves are working. This procedure takes approximately one hour. There are no restrictions for this procedure.  Thank you for choosing [facility]!!   

## 2019-02-07 DIAGNOSIS — H04123 Dry eye syndrome of bilateral lacrimal glands: Secondary | ICD-10-CM | POA: Diagnosis not present

## 2019-02-12 ENCOUNTER — Telehealth: Payer: Self-pay | Admitting: Cardiology

## 2019-02-12 NOTE — Telephone Encounter (Signed)
°  Precert needed for: Echo   Location: [facility]    Date: Mar 14, 2019

## 2019-02-18 ENCOUNTER — Other Ambulatory Visit: Payer: Self-pay | Admitting: Cardiology

## 2019-02-22 DIAGNOSIS — H2513 Age-related nuclear cataract, bilateral: Secondary | ICD-10-CM | POA: Diagnosis not present

## 2019-02-26 DIAGNOSIS — H40033 Anatomical narrow angle, bilateral: Secondary | ICD-10-CM | POA: Diagnosis not present

## 2019-02-26 DIAGNOSIS — H2513 Age-related nuclear cataract, bilateral: Secondary | ICD-10-CM | POA: Diagnosis not present

## 2019-02-27 ENCOUNTER — Other Ambulatory Visit: Payer: PPO

## 2019-03-14 ENCOUNTER — Other Ambulatory Visit: Payer: Self-pay | Admitting: Cardiology

## 2019-03-14 ENCOUNTER — Other Ambulatory Visit (INDEPENDENT_AMBULATORY_CARE_PROVIDER_SITE_OTHER): Payer: PPO

## 2019-03-14 ENCOUNTER — Other Ambulatory Visit: Payer: Self-pay

## 2019-03-14 DIAGNOSIS — R011 Cardiac murmur, unspecified: Secondary | ICD-10-CM

## 2019-03-20 ENCOUNTER — Telehealth: Payer: Self-pay | Admitting: *Deleted

## 2019-03-20 ENCOUNTER — Other Ambulatory Visit: Payer: Self-pay

## 2019-03-20 NOTE — Telephone Encounter (Signed)
LM to return call.

## 2019-03-20 NOTE — Telephone Encounter (Signed)
Pt voiced understanding - routed to pcp  

## 2019-03-20 NOTE — Telephone Encounter (Signed)
-----   Message from Herminio Commons, MD sent at 03/15/2019  1:52 PM EDT ----- Normal pumping function.  Moderate to severe mitral valve leakage.  He should follow-up with Dr. Harl Bowie as scheduled.

## 2019-03-21 ENCOUNTER — Ambulatory Visit (INDEPENDENT_AMBULATORY_CARE_PROVIDER_SITE_OTHER): Payer: PPO

## 2019-03-21 ENCOUNTER — Other Ambulatory Visit: Payer: Self-pay

## 2019-03-21 DIAGNOSIS — Z23 Encounter for immunization: Secondary | ICD-10-CM

## 2019-05-23 ENCOUNTER — Other Ambulatory Visit: Payer: Self-pay

## 2019-05-24 ENCOUNTER — Encounter: Payer: Self-pay | Admitting: Family Medicine

## 2019-05-24 ENCOUNTER — Ambulatory Visit (INDEPENDENT_AMBULATORY_CARE_PROVIDER_SITE_OTHER): Payer: PPO | Admitting: Family Medicine

## 2019-05-24 VITALS — BP 129/78 | HR 77 | Temp 98.2°F | Ht 70.0 in | Wt 186.0 lb

## 2019-05-24 DIAGNOSIS — E782 Mixed hyperlipidemia: Secondary | ICD-10-CM | POA: Diagnosis not present

## 2019-05-24 DIAGNOSIS — I10 Essential (primary) hypertension: Secondary | ICD-10-CM | POA: Diagnosis not present

## 2019-05-24 DIAGNOSIS — D539 Nutritional anemia, unspecified: Secondary | ICD-10-CM | POA: Diagnosis not present

## 2019-05-24 MED ORDER — LISINOPRIL 40 MG PO TABS: 40.0000 mg | ORAL_TABLET | Freq: Every day | ORAL | 1 refills | Status: DC

## 2019-05-24 MED ORDER — HYDROCORTISONE (PERIANAL) 2.5 % EX CREA: 1.0000 "application " | TOPICAL_CREAM | Freq: Two times a day (BID) | CUTANEOUS | 0 refills | Status: DC

## 2019-05-24 NOTE — Progress Notes (Signed)
Subjective: CC: f/u HTN, HLD, anemia PCP: Janora Norlander, DO Tyler Diaz is a 73 y.o. male presenting to clinic today for:  1. HTN w/ HLD Patient reports compliance with medications.  No chest pain, shortness of breath, change in physical activity or lower extremity edema.  Certainly no losses of consciousness.  2. Anemia Does not report any rectal bleeding, unplanned weight loss, night sweats.  He has a history of colon cancer.  He does occasionally have hemorrhoid related to this that is relieved by a cream that he cannot recall the name of but needs a refill on.  3.  Osteoarthritis Patient reports good response to the Voltaren gel topically.  He only uses Tylenol for OA and never oral NSAIDs.   ROS: Per HPI  Allergies  Allergen Reactions  . Shrimp [Shellfish Allergy] Hives, Nausea And Vomiting and Rash   Past Medical History:  Diagnosis Date  . Anemia    hx of  . Arthritis    hands/knees  . Blood clot in vein    RT ARM WITH PICC LINE   . C. difficile colitis 08/19/2013  . Colon cancer ([place]) 07/18/12 bx   rectum=Invasive adenocarcinoma w/ extracellular mucin,polyp=benign  . GERD (gastroesophageal reflux disease)    OCCASIONAL  . Hearing loss    partial greater on left  . Heart murmur   . Hematochezia    recent  . Herpes zoster - R flank - with severe pain 03/11/2013  . History of shingles   . Hyperlipidemia   . Hypertension   . Hypothyroidism as child   hx of  . Ileostomy - diverting loop - s/p takedown 06/28/2013 01/14/2013  . Mitral regurgitation due to partially flail posterior mitral valve leaflet   . Nocturia   . Numbness    TOES / FINGERS DUE TO CHEMO  . Peripheral vascular disease (HCC)    neuropathy due to cancer treatment (fingers and toes)  . Radiation    FOR COLON CANCER  . Rectal cancer ([place])   . Rheumatic fever as child   hx  . Sinusitis    seasonal  . Tinnitus    left ear  . Umbilical hernia, incarcerated - s/p primary repair July  2014 01/03/2013    Current Outpatient Medications:  .  acetaminophen (TYLENOL) 500 MG tablet, Take 500 mg by mouth every 6 (six) hours as needed for headache., Disp: , Rfl:  .  diclofenac sodium (VOLTAREN) 1 % GEL, Apply 4 g topically 4 (four) times daily. (for joint pain), Disp: 400 g, Rfl: 2 .  hydrochlorothiazide (HYDRODIURIL) 25 MG tablet, TAKE 1 TABLET BY MOUTH ONCE DAILY. STOPPING CHLORTHALIDONE. MED CHANGED 07/12/2018, Disp: 90 tablet, Rfl: 1 .  ketoconazole (NIZORAL) 2 % cream, APPLY TOPICALLY TWICE DAILY, Disp: 60 g, Rfl: 0 .  lisinopril (ZESTRIL) 40 MG tablet, Take 1 tablet (40 mg total) by mouth daily., Disp: 90 tablet, Rfl: 1 .  Multiple Vitamin (MULTIVITAMIN WITH MINERALS) TABS, Take 1 tablet by mouth every morning. , Disp: , Rfl:  .  oxymetazoline (AFRIN) 0.05 % nasal spray, Place 1 spray into both nostrils 2 (two) times daily as needed for congestion., Disp: , Rfl:  .  pravastatin (PRAVACHOL) 20 MG tablet, Take 1 tablet (20 mg total) by mouth daily., Disp: 90 tablet, Rfl: 3 .  Probiotic Product (DIGESTIVE ADVANTAGE PO), Take 1 tablet by mouth daily., Disp: , Rfl:  .  triamcinolone cream (KENALOG) 0.1 %, APPLY TOPICALLY THREE TIMES DAILY, Disp: 80 g,  Rfl: 0 Social History   Socioeconomic History  . Marital status: Widowed    Spouse name: Not on file  . Number of children: 2  . Years of education: 10  . Highest education level: 10th grade  Occupational History  . 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 Smoking status: Former Smoker    Packs/day: 1.50    Years: 20.00    Pack years: 30.00    Types: Cigarettes    Quit date: 07/27/1972    Years since quitting: 46.8  . Smokeless tobacco: Never Used  Substance and Sexual Activity  . Alcohol use: No  . Drug use: No  . Sexual activity: Not Currently  Other Topics Concern  . 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   Has two adult children and one grandchild. One son lives with him.    Social Determinants of Health   Financial Resource Strain: Low Risk   . Difficulty of Paying Living Expenses: Not hard at all  Food Insecurity:   . Worried About Charity fundraiser in the Last Year: Not on file  . Ran Out of Food in the Last Year: Not on file  Transportation Needs:   . Lack of Transportation (Medical): Not on file  . Lack of Transportation (Non-Medical): Not on file  Physical Activity: Inactive  . Days of Exercise per Week: 0 days  . Minutes of Exercise per Session: 0 min  Stress:   . Feeling of Stress : Not on file  Social Connections:   . Frequency of Communication with Friends and Family: Not on file  . Frequency of Social Gatherings with Friends and Family: Not on file  . Attends Religious Services: Not on file  . Active Member of Clubs or Organizations: Not on file  . Attends Archivist Meetings: Not on file  . Marital Status: Not on file  Intimate Partner Violence:   . Fear of Current or Ex-Partner: Not on file  . Emotionally Abused: Not on file  . Physically Abused: Not on file  . Sexually Abused: Not on file   Family History  Problem Relation Age of Onset  . Cancer Mother        abdominal ca ?ovarian?  . Heart disease Father   . Cancer Sister        liver cancer  . Heart disease Brother   . COPD Brother   . Healthy Son   . Healthy Son     Objective: Office vital signs reviewed. BP 129/78   Pulse 77   Temp 98.2 F (36.8 C) (Temporal)   Ht 5\' 10"  (1.778 m)   Wt 186 lb (84.4 kg)   SpO2 96%   BMI 26.69 kg/m   Physical Examination:  General: Awake, alert, well nourished, No acute distress HEENT: Normal; no carotid bruits Cardio: regular rate and rhythm, S1S2 heard, 2/6 SEM murmur appreciated that does not radiate to carotids Pulm: clear to auscultation bilaterally, no wheezes, rhonchi or rales; normal work of breathing on room air Extremities: warm, well  perfused, No edema, cyanosis or clubbing; +2 pulses bilaterally  Assessment/ Plan: 73 y.o. male   1. Essential hypertension Controlled.  Continue current regimen - Basic Metabolic Panel - lisinopril (ZESTRIL) 40 MG tablet; Take 1 tablet (40 mg total) by mouth daily.  Dispense: 90 tablet; Refill: 1  2.  Mixed hyperlipidemia Not fasting today so lipid panel not obtained.  Plan for fasting lipid panel at next visit  3. Macrocytic anemia Check vitamin B12 and folate - Vitamin B12 - Folate   Orders Placed This Encounter  Procedures  . Basic Metabolic Panel  . Lipid Panel  . Vitamin B12  . Folate   Meds ordered this encounter  Medications  . hydrocortisone (PROCTO-MED HC) 2.5 % rectal cream    Sig: Place 1 application rectally 2 (two) times daily. x7-10 days prn hemorrhoid (may need to use brand for ins coverage)    Dispense:  30 g    Refill:  0  . lisinopril (ZESTRIL) 40 MG tablet    Sig: Take 1 tablet (40 mg total) by mouth daily.    Dispense:  90 tablet    Refill:  1    Please consider 90 day supplies to promote better adherence     Janora Norlander, DO [place] [phone]

## 2019-05-24 NOTE — Patient Instructions (Signed)
Make sure to fast next visit and we will check cholesterol.  Checking your kidney function and anemia today.

## 2019-05-25 LAB — VITAMIN B12: Vitamin B-12: 397 pg/mL (ref 232–1245)

## 2019-05-25 LAB — BASIC METABOLIC PANEL
BUN/Creatinine Ratio: 31 — ABNORMAL HIGH (ref 10–24)
BUN: 31 mg/dL — ABNORMAL HIGH (ref 8–27)
CO2: 21 mmol/L (ref 20–29)
Calcium: 9.2 mg/dL (ref 8.6–10.2)
Chloride: 106 mmol/L (ref 96–106)
Creatinine, Ser: 1.01 mg/dL (ref 0.76–1.27)
GFR calc Af Amer: 84 mL/min/{1.73_m2} (ref >59)
GFR calc non Af Amer: 72 mL/min/{1.73_m2} (ref >59)
Glucose: 93 mg/dL (ref 65–99)
Potassium: 4.3 mmol/L (ref 3.5–5.2)
Sodium: 141 mmol/L (ref 134–144)

## 2019-05-25 LAB — FOLATE: Folate: >20 ng/mL (ref >3.0)

## 2019-06-28 ENCOUNTER — Other Ambulatory Visit: Payer: Self-pay | Admitting: Family Medicine

## 2019-06-28 DIAGNOSIS — I10 Essential (primary) hypertension: Secondary | ICD-10-CM

## 2019-07-11 ENCOUNTER — Emergency Department (HOSPITAL_COMMUNITY): Payer: PPO

## 2019-07-11 ENCOUNTER — Inpatient Hospital Stay (HOSPITAL_COMMUNITY)
Admission: EM | Admit: 2019-07-11 | Discharge: 2019-07-17 | DRG: 872 | Disposition: A | Discharge status: Home Health | Payer: PPO | Source: Ambulatory Visit | Attending: Internal Medicine | Admitting: Internal Medicine

## 2019-07-11 ENCOUNTER — Other Ambulatory Visit: Payer: Self-pay

## 2019-07-11 ENCOUNTER — Ambulatory Visit (INDEPENDENT_AMBULATORY_CARE_PROVIDER_SITE_OTHER): Payer: PPO | Admitting: Family Medicine

## 2019-07-11 ENCOUNTER — Encounter (HOSPITAL_COMMUNITY): Payer: Self-pay | Admitting: Emergency Medicine

## 2019-07-11 DIAGNOSIS — R509 Fever, unspecified: Secondary | ICD-10-CM

## 2019-07-11 DIAGNOSIS — A419 Sepsis, unspecified organism: Secondary | ICD-10-CM | POA: Diagnosis not present

## 2019-07-11 DIAGNOSIS — Z87891 Personal history of nicotine dependence: Secondary | ICD-10-CM | POA: Diagnosis not present

## 2019-07-11 DIAGNOSIS — I340 Nonrheumatic mitral (valve) insufficiency: Secondary | ICD-10-CM | POA: Diagnosis present

## 2019-07-11 DIAGNOSIS — I129 Hypertensive chronic kidney disease with stage 1 through stage 4 chronic kidney disease, or unspecified chronic kidney disease: Secondary | ICD-10-CM | POA: Diagnosis present

## 2019-07-11 DIAGNOSIS — A4101 Sepsis due to Methicillin susceptible Staphylococcus aureus: Principal | ICD-10-CM

## 2019-07-11 DIAGNOSIS — R109 Unspecified abdominal pain: Secondary | ICD-10-CM | POA: Diagnosis not present

## 2019-07-11 DIAGNOSIS — I059 Rheumatic mitral valve disease, unspecified: Secondary | ICD-10-CM | POA: Diagnosis present

## 2019-07-11 DIAGNOSIS — B9561 Methicillin susceptible Staphylococcus aureus infection as the cause of diseases classified elsewhere: Secondary | ICD-10-CM | POA: Diagnosis not present

## 2019-07-11 DIAGNOSIS — M542 Cervicalgia: Secondary | ICD-10-CM | POA: Diagnosis present

## 2019-07-11 DIAGNOSIS — Z20822 Contact with and (suspected) exposure to covid-19: Secondary | ICD-10-CM | POA: Diagnosis present

## 2019-07-11 DIAGNOSIS — Z85048 Personal history of other malignant neoplasm of rectum, rectosigmoid junction, and anus: Secondary | ICD-10-CM | POA: Diagnosis not present

## 2019-07-11 DIAGNOSIS — G8929 Other chronic pain: Secondary | ICD-10-CM | POA: Diagnosis present

## 2019-07-11 DIAGNOSIS — Z800 Family history of malignant neoplasm of digestive organs: Secondary | ICD-10-CM

## 2019-07-11 DIAGNOSIS — Z8249 Family history of ischemic heart disease and other diseases of the circulatory system: Secondary | ICD-10-CM | POA: Diagnosis not present

## 2019-07-11 DIAGNOSIS — Z9221 Personal history of antineoplastic chemotherapy: Secondary | ICD-10-CM

## 2019-07-11 DIAGNOSIS — L259 Unspecified contact dermatitis, unspecified cause: Secondary | ICD-10-CM | POA: Diagnosis present

## 2019-07-11 DIAGNOSIS — R6510 Systemic inflammatory response syndrome (SIRS) of non-infectious origin without acute organ dysfunction: Secondary | ICD-10-CM | POA: Diagnosis present

## 2019-07-11 DIAGNOSIS — Z923 Personal history of irradiation: Secondary | ICD-10-CM

## 2019-07-11 DIAGNOSIS — R7881 Bacteremia: Secondary | ICD-10-CM | POA: Diagnosis present

## 2019-07-11 DIAGNOSIS — E785 Hyperlipidemia, unspecified: Secondary | ICD-10-CM | POA: Diagnosis present

## 2019-07-11 DIAGNOSIS — I10 Essential (primary) hypertension: Secondary | ICD-10-CM | POA: Diagnosis not present

## 2019-07-11 DIAGNOSIS — I361 Nonrheumatic tricuspid (valve) insufficiency: Secondary | ICD-10-CM | POA: Diagnosis not present

## 2019-07-11 DIAGNOSIS — E039 Hypothyroidism, unspecified: Secondary | ICD-10-CM | POA: Diagnosis not present

## 2019-07-11 DIAGNOSIS — Z8679 Personal history of other diseases of the circulatory system: Secondary | ICD-10-CM | POA: Diagnosis not present

## 2019-07-11 DIAGNOSIS — Z825 Family history of asthma and other chronic lower respiratory diseases: Secondary | ICD-10-CM | POA: Diagnosis not present

## 2019-07-11 DIAGNOSIS — D539 Nutritional anemia, unspecified: Secondary | ICD-10-CM | POA: Diagnosis present

## 2019-07-11 DIAGNOSIS — I38 Endocarditis, valve unspecified: Secondary | ICD-10-CM | POA: Diagnosis not present

## 2019-07-11 DIAGNOSIS — C20 Malignant neoplasm of rectum: Secondary | ICD-10-CM | POA: Diagnosis present

## 2019-07-11 DIAGNOSIS — Z9049 Acquired absence of other specified parts of digestive tract: Secondary | ICD-10-CM

## 2019-07-11 DIAGNOSIS — N1831 Chronic kidney disease, stage 3a: Secondary | ICD-10-CM | POA: Diagnosis not present

## 2019-07-11 DIAGNOSIS — K8020 Calculus of gallbladder without cholecystitis without obstruction: Secondary | ICD-10-CM | POA: Diagnosis not present

## 2019-07-11 LAB — CBC WITH DIFFERENTIAL/PLATELET
Abs Immature Granulocytes: 0.09 10*3/uL — ABNORMAL HIGH (ref 0.00–0.07)
Basophils Absolute: 0.1 10*3/uL (ref 0.0–0.1)
Basophils Relative: 0 %
Eosinophils Absolute: 0.1 10*3/uL (ref 0.0–0.5)
Eosinophils Relative: 0 %
HCT: 41.5 % (ref 39.0–52.0)
Hemoglobin: 13.6 g/dL (ref 13.0–17.0)
Immature Granulocytes: 1 %
Lymphocytes Relative: 3 %
Lymphs Abs: 0.5 10*3/uL — ABNORMAL LOW (ref 0.7–4.0)
MCH: 34 pg (ref 26.0–34.0)
MCHC: 32.8 g/dL (ref 30.0–36.0)
MCV: 103.8 fL — ABNORMAL HIGH (ref 80.0–100.0)
Monocytes Absolute: 1.4 10*3/uL — ABNORMAL HIGH (ref 0.1–1.0)
Monocytes Relative: 10 %
Neutro Abs: 12.3 10*3/uL — ABNORMAL HIGH (ref 1.7–7.7)
Neutrophils Relative %: 86 %
Platelets: 134 10*3/uL — ABNORMAL LOW (ref 150–400)
RBC: 4 MIL/uL — ABNORMAL LOW (ref 4.22–5.81)
RDW: 13.9 % (ref 11.5–15.5)
WBC: 14.3 10*3/uL — ABNORMAL HIGH (ref 4.0–10.5)
nRBC: 0 % (ref 0.0–0.2)

## 2019-07-11 LAB — URINALYSIS, ROUTINE W REFLEX MICROSCOPIC
Bacteria, UA: NONE SEEN
Bilirubin Urine: NEGATIVE
Glucose, UA: NEGATIVE mg/dL
Ketones, ur: NEGATIVE mg/dL
Leukocytes,Ua: NEGATIVE
Nitrite: NEGATIVE
Protein, ur: NEGATIVE mg/dL
Specific Gravity, Urine: 1.018 (ref 1.005–1.030)
pH: 5 (ref 5.0–8.0)

## 2019-07-11 LAB — COMPREHENSIVE METABOLIC PANEL
ALT: 26 U/L (ref 0–44)
AST: 22 U/L (ref 15–41)
Albumin: 4.4 g/dL (ref 3.5–5.0)
Alkaline Phosphatase: 41 U/L (ref 38–126)
Anion gap: 12 (ref 5–15)
BUN: 24 mg/dL — ABNORMAL HIGH (ref 8–23)
CO2: 22 mmol/L (ref 22–32)
Calcium: 9.3 mg/dL (ref 8.9–10.3)
Chloride: 104 mmol/L (ref 98–111)
Creatinine, Ser: 1.14 mg/dL (ref 0.61–1.24)
GFR calc Af Amer: >60 mL/min (ref >60)
GFR calc non Af Amer: >60 mL/min (ref >60)
Glucose, Bld: 99 mg/dL (ref 70–99)
Potassium: 4.3 mmol/L (ref 3.5–5.1)
Sodium: 138 mmol/L (ref 135–145)
Total Bilirubin: 1.3 mg/dL — ABNORMAL HIGH (ref 0.3–1.2)
Total Protein: 6.6 g/dL (ref 6.5–8.1)

## 2019-07-11 LAB — LIPASE, BLOOD: Lipase: 24 U/L (ref 11–51)

## 2019-07-11 LAB — LACTIC ACID, PLASMA: Lactic Acid, Venous: 1.3 mmol/L (ref 0.5–1.9)

## 2019-07-11 LAB — POC SARS CORONAVIRUS 2 AG -  ED: SARS Coronavirus 2 Ag: NEGATIVE

## 2019-07-11 MED ORDER — ONDANSETRON HCL 4 MG/2ML IJ SOLN
4.0000 mg | Freq: Once | INTRAMUSCULAR | Status: AC
  Administered 2019-07-11: 4 mg via INTRAVENOUS
  Filled 2019-07-11: qty 2

## 2019-07-11 MED ORDER — VANCOMYCIN HCL 1750 MG/350ML IV SOLN
1750.0000 mg | Freq: Once | INTRAVENOUS | Status: AC
  Administered 2019-07-12: 1750 mg via INTRAVENOUS

## 2019-07-11 MED ORDER — SODIUM CHLORIDE 0.9 % IV SOLN
2.0000 g | Freq: Two times a day (BID) | INTRAVENOUS | Status: DC
  Administered 2019-07-12: 2 g via INTRAVENOUS
  Filled 2019-07-11: qty 2

## 2019-07-11 MED ORDER — ACETAMINOPHEN 325 MG PO TABS
650.0000 mg | ORAL_TABLET | Freq: Once | ORAL | Status: AC
  Administered 2019-07-11: 650 mg via ORAL
  Filled 2019-07-11: qty 2

## 2019-07-11 MED ORDER — IOHEXOL 300 MG/ML  SOLN
100.0000 mL | Freq: Once | INTRAMUSCULAR | Status: AC | PRN
  Administered 2019-07-11: 100 mL via INTRAVENOUS

## 2019-07-11 MED ORDER — SODIUM CHLORIDE 0.9 % IV BOLUS: 500.0000 mL | Freq: Once | INTRAVENOUS | Status: DC

## 2019-07-11 MED ORDER — VANCOMYCIN HCL IN DEXTROSE 1-5 GM/200ML-% IV SOLN: 1000.0000 mg | Freq: Once | INTRAVENOUS | Status: DC

## 2019-07-11 MED ORDER — SODIUM CHLORIDE 0.9 % IV SOLN
2.0000 g | Freq: Once | INTRAVENOUS | Status: AC
  Administered 2019-07-11: 2 g via INTRAVENOUS
  Filled 2019-07-11: qty 2

## 2019-07-11 MED ORDER — MORPHINE SULFATE (PF) 4 MG/ML IV SOLN
4.0000 mg | Freq: Once | INTRAVENOUS | Status: AC
  Administered 2019-07-11: 4 mg via INTRAVENOUS
  Filled 2019-07-11: qty 1

## 2019-07-11 MED ORDER — VANCOMYCIN HCL 1500 MG/300ML IV SOLN: 1500.0000 mg | INTRAVENOUS | Status: DC

## 2019-07-11 MED ORDER — METRONIDAZOLE IN NACL 5-0.79 MG/ML-% IV SOLN
500.0000 mg | Freq: Once | INTRAVENOUS | Status: DC
  Filled 2019-07-11: qty 100

## 2019-07-11 MED ORDER — SODIUM CHLORIDE 0.9 % IV BOLUS
500.0000 mL | Freq: Once | INTRAVENOUS | Status: AC
  Administered 2019-07-11: 500 mL via INTRAVENOUS

## 2019-07-11 MED ORDER — ACETAMINOPHEN 325 MG PO TABS
325.0000 mg | ORAL_TABLET | Freq: Once | ORAL | Status: AC
  Administered 2019-07-11: 325 mg via ORAL
  Filled 2019-07-11: qty 1

## 2019-07-11 NOTE — ED Triage Notes (Signed)
C/o abdominal pain since last night, rating pain 8/10.  C/o back pain and pain down left leg.  Pt seen at [facility] in [place] today and told to come to ED for farther Evaluation.

## 2019-07-11 NOTE — Progress Notes (Signed)
Telephone visit  Subjective: CC: fever, side pain PCP: Janora Norlander, DO [id number] Tyler Diaz is a 74 y.o. male calls for telephone consult today. Patient provides verbal consent for consult held via phone.  Due to COVID-19 pandemic this visit was conducted virtually. This visit type was conducted due to national recommendations for restrictions regarding the COVID-19 Pandemic (e.g. social distancing, sheltering in place) in an effort to limit this patient's exposure and mitigate transmission in our community. All issues noted in this document were discussed and addressed.  A physical exam was not performed with this format.   Location of patient: home Location of provider: [facility] Others present for call: son  1. Side pain Patient reports he developed acute right side abdominal pain.  Denies nausea, vomiting, diarrhea.  He reports that the right side of his abdomen feels warm.  He doesn't see any redness. He had a fever to 103F.  He took tylenol and it seems to be getting better.  A/P:  1. Febrile illness, acute I am recommending that patient be seen at the nearest urgent care facility.  I think he needs a hands on evaluation given ongoing fever of uncertain etiology. ?pyelonephritis given side pain vs intraabdominal infection.  I spoke to [facility] urgent care.  I have given her the information I am concerned about.  She voiced good understanding and appreciation for the call.   Start time: 11:58am End time: 12:04pm  Total time spent on patient care (including telephone call/ virtual visit): 10 minutes  [place], [place] [phone]

## 2019-07-11 NOTE — Progress Notes (Signed)
Pharmacy Antibiotic Note  ILARIO MARLO is a 74 y.o. male admitted on 07/11/2019 with sepsis.  Pharmacy has been consulted for vancomycin and cefepime dosing.  Plan: Vancomycin 1500mg  IV every 24 hours.  Goal trough 15-20 mcg/mL. cefepime 2gm iv q12h   Height: 5\' 10"  (177.8 cm) Weight: 180 lb (81.6 kg) IBW/kg (Calculated) : 73  Temp (24hrs), Avg:99.9 F (37.7 C), Min:99.9 F (37.7 C), Max:99.9 F (37.7 C)  Recent Labs  Lab 07/11/19 1500  WBC 14.3*  CREATININE 1.14    Estimated Creatinine Clearance: 57.8 mL/min (by C-G formula based on SCr of 1.14 mg/dL).    Allergies  Allergen Reactions  . Shrimp [Shellfish Allergy] Hives, Nausea And Vomiting and Rash    Antimicrobials this admission: 2/4 cefepime >>  2/4 vancomycin >>    Microbiology results: 2/4 BCx: sent   Thank you for allowing pharmacy to be a part of this patient's care.  Donna Christen Baruc [place] 07/11/2019 8:11 PM

## 2019-07-11 NOTE — ED Provider Notes (Signed)
[place] Provider Note   CSN: [id number] Arrival date & time: 07/11/19  1409     History Chief Complaint  Patient presents with  . Abdominal Pain    Tyler Diaz is a 74 y.o. male with a history of hypertension, hyperlipidemia, GERD, hypothyroidism,rectal cancer status post resection, & multiple prior abdominal surgeries including several hernia repairs, ileostomy status post closure, and lower anterior resection with coloanal anastomosis who presents to the emergency department from urgent care for evaluation of abdominal pain that began last night.  Patient states the pain is generalized but most prominent to the left side, it waxes and wanes in severity, currently a 7 out of 10.  Pain is worse with movement and certain positions.  No alleviating factors.  He states he had one loose stool last night as well as a fever to 102 at home.  Denies nausea, vomiting, chest pain, dyspnea, dysuria, melena, hematochezia, constipation, or testicular pain/swelling.  Triage mentions some back pain and leg pain which patient states is not necessarily new.  HPI     Past Medical History:  Diagnosis Date  . Anemia    hx of  . Arthritis    hands/knees  . Blood clot in vein    RT ARM WITH PICC LINE   . C. difficile colitis 08/19/2013  . Colon cancer ([facility]) 07/18/12 bx   rectum=Invasive adenocarcinoma w/ extracellular mucin,polyp=benign  . GERD (gastroesophageal reflux disease)    OCCASIONAL  . Hearing loss    partial greater on left  . Heart murmur   . Hematochezia    recent  . Herpes zoster - R flank - with severe pain 03/11/2013  . History of shingles   . Hyperlipidemia   . Hypertension   . Hypothyroidism as child   hx of  . Ileostomy - diverting loop - s/p takedown 06/28/2013 01/14/2013  . Mitral regurgitation due to partially flail posterior mitral valve leaflet   . Nocturia   . Numbness    TOES / FINGERS DUE TO CHEMO  . Peripheral vascular disease (HCC)    neuropathy due to cancer treatment (fingers and toes)  . Radiation    FOR COLON CANCER  . Rectal cancer ([place])   . Rheumatic fever as child   hx  . Sinusitis    seasonal  . Tinnitus    left ear  . Umbilical hernia, incarcerated - s/p primary repair July 2014 01/03/2013    Patient Active Problem List   Diagnosis Date Noted  . BPH (benign prostatic hyperplasia) 10/22/2015  . Recurrent left inguinal hernia s/p lap repair w mesh 08/13/2015 08/13/2015  . Right inguinal hernia s/p lap repair w mesh 08/13/2015  . History of SBE (subacute bacterial endocarditis) 07/06/2013  . Mitral regurgitation due to partially flail posterior mitral valve leaflet 07/30/2012  . Hyperlipidemia   . Rectal cancer - distal s/p lap LAR/coloanal July 2014 - [id number]. 07/27/2012  . Hypertension     Past Surgical History:  Procedure Laterality Date  . CARPAL TUNNEL RELEASE Right 2002  . ILEOSTOMY N/A 12/27/2012   Procedure: DIVERTING LOOP ILEOSTOMY;  Surgeon: Adin Hector, MD;  Location: [facility];  Service: General;  Laterality: N/A;  . ILEOSTOMY CLOSURE N/A 06/28/2013   Procedure: loop ILEOSTOMY TAKEDOWN examination of anesthesia ;  Surgeon: Adin Hector, MD;  Location: [facility];  Service: General;  Laterality: N/A;  . INGUINAL HERNIA REPAIR Left   . INGUINAL HERNIA REPAIR Bilateral 08/13/2015   Procedure: LAPAROSCOPIC  BILATERAL INGUINAL HERNIA REPAIR WITH MESH;  Surgeon: Michael Boston, MD;  Location: [facility];  Service: General;  Laterality: Bilateral;  . INSERTION OF MESH Bilateral 08/13/2015   Procedure: INSERTION OF MESH;  Surgeon: Michael Boston, MD;  Location: [facility];  Service: General;  Laterality: Bilateral;  . LAPAROSCOPIC LOW ANTERIOR RESCECTION WITH COLOANAL ANASTOMOSIS N/A 12/27/2012   Procedure: LAPAROSCOPIC LOW ANTERIOR RESCECTION WITH COLOANAL ANASTOMOSIS;  Surgeon: Adin Hector, MD;  Location: [facility];  Service: General;  Laterality: N/A;  . LAPAROSCOPIC LOW ANTERIOR RESECTION N/A 12/27/2012    Procedure: LAPAROSCOPIC LOW ANTERIOR RESECTION, COLO-ANAL ANASTOMOSIS, DIVERTING LOOP ILEOSTOMY,SPLENIC FLEXURE MOBILIZATION, PRIMARY INCARCERATED UMBILICAL HERNIA REPAIR;  Surgeon: Adin Hector, MD;  Location: [facility];  Service: General;  Laterality: N/A;  . TEE WITHOUT CARDIOVERSION N/A 07/10/2013   Procedure: TRANSESOPHAGEAL ECHOCARDIOGRAM (TEE);  Surgeon: Candee Furbish, MD;  Location: [facility] END[facility]COPY;  Service: Cardiovascular;  Laterality: N/A;  . TEE WITHOUT CARDIOVERSION N/A 08/21/2013   Procedure: TRANSESOPHAGEAL ECHOCARDIOGRAM (TEE);  Surgeon: Sanda Klein, MD;  Location: [facility] ENDOSCOPY;  Service: Cardiovascular;  Laterality: N/A;  . TONSILLECTOMY     as child  . UMBILICAL HERNIA REPAIR  12/27/2012   Procedure: PRIMARY REPAIR INCARCERATED UMBILICAL HERNIA;  Surgeon: Adin Hector, MD;  Location: [facility];  Service: General;;       Family History  Problem Relation Age of Onset  . Cancer Mother        abdominal ca ?ovarian?  . Heart disease Father   . Cancer Sister        liver cancer  . Heart disease Brother   . COPD Brother   . Healthy Son   . Healthy Son     Social History   Tobacco Use  . Smoking status: Former Smoker    Packs/day: 1.50    Years: 20.00    Pack years: 30.00    Types: Cigarettes    Quit date: 07/27/1972    Years since quitting: 46.9  . Smokeless tobacco: Never Used  Substance Use Topics  . Alcohol use: No  . Drug use: No    Home Medications Prior to Admission medications   Medication Sig Start Date End Date Taking? Authorizing Provider  acetaminophen (TYLENOL) 500 MG tablet Take 1,000 mg by mouth every 6 (six) hours as needed for headache.    Yes [provider]  diclofenac sodium (VOLTAREN) 1 % GEL Apply 4 g topically 4 (four) times daily. (for joint pain) 11/21/18  Yes Gottschalk, Ashly M, DO  hydrochlorothiazide (HYDRODIURIL) 25 MG tablet TAKE 1 TABLET BY MOUTH ONCE DAILY. STOPPING CHLORTHALIDONE. MED CHANGED 07/12/2018 Patient taking  differently: Take 25 mg by mouth daily.  02/19/19  Yes Branch, Alphonse Guild, MD  hydrocortisone (PROCTO-MED HC) 2.5 % rectal cream Place 1 application rectally 2 (two) times daily. x7-10 days prn hemorrhoid (may need to use brand for ins coverage) Patient taking differently: Place 1 application rectally 2 (two) times daily as needed for hemorrhoids or anal itching. x7-10 days prn hemorrhoid (may need to use brand for ins coverage) 05/24/19  Yes Gottschalk, Ashly M, DO  ketoconazole (NIZORAL) 2 % cream APPLY TOPICALLY TWICE DAILY Patient taking differently: Apply 1 application topically 2 (two) times daily.  09/11/18  Yes Claretta Fraise, MD  lisinopril (ZESTRIL) 40 MG tablet Take 1 tablet by mouth once daily Patient taking differently: Take 40 mg by mouth daily.  06/28/19  Yes Ronnie Doss M, DO  Multiple Vitamin (MULTIVITAMIN WITH MINERALS) TABS Take 1 tablet  by mouth every morning.    Yes [provider]  oxymetazoline (AFRIN) 0.05 % nasal spray Place 1 spray into both nostrils 2 (two) times daily as needed for congestion.   Yes [provider]  pravastatin (PRAVACHOL) 20 MG tablet Take 1 tablet (20 mg total) by mouth daily. 05/22/18  Yes Eustaquio Maize, MD  Probiotic Product (DIGESTIVE ADVANTAGE PO) Take 1 tablet by mouth daily.   Yes [provider]  psyllium (METAMUCIL) 58.6 % packet Take 1 packet by mouth daily.   Yes [provider]  triamcinolone cream (KENALOG) 0.1 % APPLY TOPICALLY THREE TIMES DAILY Patient taking differently: Apply 1 application topically 3 (three) times daily.  01/18/19  Yes Ronnie Doss M, DO    Allergies    Shrimp [shellfish allergy]  Review of Systems   Review of Systems Constitutional: Positive for fever. Negative for chills.  Respiratory: Negative for shortness of breath.   Cardiovascular: Negative for chest pain.  Gastrointestinal: Positive for abdominal pain and diarrhea. Negative for anal bleeding, blood in stool,  constipation, nausea and vomiting.  Genitourinary: Negative for dysuria, scrotal swelling and testicular pain.  All other systems reviewed and are negative.  Physical Exam Updated Vital Signs BP (!) 143/92   Pulse 84   Temp 99.9 F (37.7 C) (Oral)   Resp 18   Ht 5\' 10"  (1.778 m)   Wt 81.6 kg   SpO2 97%   BMI 25.83 kg/m   Physical Exam Vitals and nursing note reviewed.  Constitutional:  General: He is not in acute distress. Appearance: He is well-developed. He is not toxic-appearing.  HENT:  Head: Normocephalic and atraumatic.  Eyes:  General:  Right eye: No discharge.  Left eye: No discharge.  Conjunctiva/sclera: Conjunctivae normal.  Cardiovascular:  Rate and Rhythm: Normal rate and regular rhythm.  Pulmonary:  Effort: Pulmonary effort is normal. No respiratory distress.  Breath sounds: Normal breath sounds. No wheezing, rhonchi or rales.  Abdominal:  General: Bowel sounds are normal. There is no distension.  Palpations: Abdomen is soft.  Tenderness: There is abdominal tenderness (generalized, most prominent in the LLQ). There is no right CVA tenderness, left CVA tenderness, guarding or rebound.  Musculoskeletal:  Cervical back: Neck supple.  Comments: Back: No midline tenderness  Skin:  General: Skin is warm and dry.  Findings: No rash.  Neurological:  General: No focal deficit present.  Mental Status: He is alert.  Comments: Clear speech. 5 of 5 strength with plantar &dorsiflexion bilaterally. Station grossly intact bilateral lower extremities.  Psychiatric:  Behavior: Behavior normal.    ED Results / Procedures / Treatments   Labs (all labs ordered are listed, but only abnormal results are displayed) Labs Reviewed  COMPREHENSIVE METABOLIC PANEL - Abnormal; Notable for the following components:      Result Value   BUN 24 (*)    Total Bilirubin 1.3 (*)    All other components within normal limits  CBC WITH DIFFERENTIAL/PLATELET - Abnormal; Notable for  the following components:   WBC 14.3 (*)    RBC 4.00 (*)    MCV 103.8 (*)    Platelets 134 (*)    Neutro Abs 12.3 (*)    Lymphs Abs 0.5 (*)    Monocytes Absolute 1.4 (*)    Abs Immature Granulocytes 0.09 (*)    All other components within normal limits  URINALYSIS, ROUTINE W REFLEX MICROSCOPIC - Abnormal; Notable for the following components:   Hgb urine dipstick MODERATE (*)  All other components within normal limits  LIPASE, BLOOD    EKG None  Radiology CT Abdomen Pelvis W Contrast  Result Date: 07/11/2019 CLINICAL DATA:  Patient with abdominal pain. Prior partial colectomy. EXAM: CT ABDOMEN AND PELVIS WITH CONTRAST TECHNIQUE: Multidetector CT imaging of the abdomen and pelvis was performed using the standard protocol following bolus administration of intravenous contrast. CONTRAST:  1105mL OMNIPAQUE IOHEXOL 300 MG/ML  SOLN COMPARISON:  CT abdomen pelvis 07/20/2015 FINDINGS: Lower chest: Minimal atelectasis right lung base. No pleural effusion. Hepatobiliary: Liver is normal in size and contour. Fatty deposition adjacent to the falciform ligament. Multiple stones in the gallbladder lumen. No intrahepatic or extrahepatic biliary ductal dilatation. Pancreas: Unremarkable Spleen: Unremarkable Adrenals/Urinary Tract: Normal adrenal glands. Kidneys are symmetric in size. No hydronephrosis. Mild urinary bladder wall thickening. Similar-appearing right renal cyst and additional subcentimeter too small to characterize low-attenuation renal lesions. Stomach/Bowel: Prior left hemicolectomy. Normal morphology of the stomach. No evidence for small bowel obstruction. No free fluid or free intraperitoneal air. Vascular/Lymphatic: Normal caliber abdominal aorta. Peripheral calcified atherosclerotic plaque no retroperitoneal lymphadenopathy. Reproductive: Heterogeneous prostate. Other: Postsurgical changes compatible with prior inguinal hernia repair. Musculoskeletal: Lumbar spine and lower thoracic spine  degenerative changes. IMPRESSION: Mild urinary bladder wall thickening. Recommend correlation with urinalysis to exclude the possibility of urinary tract infection. Cholelithiasis without CT evidence for acute cholecystitis. Electronically Signed   By: Lovey Newcomer M.D.   On: 07/11/2019 17:53    Procedures Procedures (including critical care time)  Medications Ordered in ED Medications  acetaminophen (TYLENOL) tablet 650 mg (has no administration in time range)  sodium chloride 0.9 % bolus 500 mL (500 mLs Intravenous New Bag/Given 07/11/19 1749)  ondansetron (ZOFRAN) injection 4 mg (4 mg Intravenous Given 07/11/19 1604)  morphine 4 MG/ML injection 4 mg (4 mg Intravenous Given 07/11/19 1605)  iohexol (OMNIPAQUE) 300 MG/ML solution 100 mL (100 mLs Intravenous Contrast Given 07/11/19 1724)    ED Course  I have reviewed the triage vital signs and the nursing notes.  Pertinent labs & imaging results that were available during my care of the patient were reviewed by me and considered in my medical decision making (see chart for details).    MDM Rules/Calculators/A&P                      Patient presents to the emergency department from urgent care for evaluation of abdominal pain that began last night.  He is nontoxic, resting comfortably, vitals without significant abnormality in the emergency department.  On exam he has generalized abdominal tenderness that is more prominent to the left abdomen.  No peritoneal signs.  Plan for labs and CT abdomen/pelvis.  Fluids, analgesics, and antiemetics ordered  CBC: Mild leukocytosis with left shift.  No anemia. CMP: No significant electrolyte derangement.  Mildly elevated total bili, LFTs WNL.   Urinalysis: Hematuria but no UTI. CT abdomen/pelvis: Mild urinary bladder wall thickening, recommend clinical correlation with urinalysis to squeeze the possibility of urinary tract infection.  Cholelithiasis of acute cholecystitis  Urinalysis not consistent with  UTI. Chest x-ray: No acute abnormality.  On reassessment patient is tachycardic in the 130s, repeat temperature 102.7 orally- remains w/ unclear source, no pneumonia, UTI, or obvious source of CT A/P, no meningismus, rapid covid test subsequently obtained & negative, blood cultures & lactic acid, has already received 650 mg of Tylenol, will given additional 325 mg and start broad spectrum abx. He also transiently desaturated into the upper 80s, returned to  92-94% on my assessment. Plan for admission.   Findings and plan of care discussed with supervising physician Dr. Wilson Singer who is in agreement.   20:55: CONSULT: Discussed with hospitalist Dr. Denton Brick- accepts admission.   NEVAN TEWALT was evaluated in Emergency Department on 07/11/2019 for the symptoms described in the history of present illness. He/she was evaluated in the context of the global COVID-19 pandemic, which necessitated consideration that the patient might be at risk for infection with the SARS-CoV-2 virus that causes COVID-19. Institutional protocols and algorithms that pertain to the evaluation of patients at risk for COVID-19 are in a state of rapid change based on information released by regulatory bodies including the CDC and federal and state organizations. These policies and algorithms were followed during the patient's care in the ED.   iagnoses Final diagnoses:  SIRS (systemic inflammatory response syndrome) [facility])    Rx / DC Orders ED Discharge Orders    None       Leafy Kindle 07/11/19 2056    Virgel Manifold, MD 07/12/19 1900

## 2019-07-11 NOTE — H&P (Signed)
History and Physical    Tyler Diaz [id number] DOB: 1945/11/12 DOA: 07/11/2019  PCP: Janora Norlander, DO   Patient coming from: Home  I have personally briefly reviewed patient's old medical records in [place]  Chief Complaint: Abdominal pain, fever  HPI: Tyler Diaz is a 74 y.o. male with medical history significant for hypertension, colon cancer, subacute bacterial endocarditis, mitral regurgitation. Patient presented to the ED with complaints of abdominal pain, mostly left-sided, that has mostly resolved at this time. One episode of loose stool last night, and fever to 102 at home.  No vomiting.  No pain with urination.  He has some mild chronic neck pain that is unchanged, no neck stiffness.  He denies cough, no difficulty breathing.  No known Covid positive contacts.  ED Course: T-max 102.7, tachycardic to 127, fluctuating O2 saturations,  > 94 % on room air.  WBC 14.3.  Normal lactic acid of 1.3. POC COVID-19 test negative.  Chest x-ray negative for acute abnormality, CT abdomen pelvis with contrast unremarkable for acute pathology.  UA with moderate hemoglobin otherwise no suggestive of UTI.  Blood and urine cultures obtained.  Antibiotics IV vancomycin cefepime and metronidazole started in ED.  Hospitalist admit for further evaluation.   Review of Systems: As per HPI all other systems reviewed and negative.  Past Medical History:  Diagnosis Date  . Anemia    hx of  . Arthritis    hands/knees  . Blood clot in vein    RT ARM WITH PICC LINE   . C. difficile colitis 08/19/2013  . Colon cancer ([place]) 07/18/12 bx   rectum=Invasive adenocarcinoma w/ extracellular mucin,polyp=benign  . GERD (gastroesophageal reflux disease)    OCCASIONAL  . Hearing loss    partial greater on left  . Heart murmur   . Hematochezia    recent  . Herpes zoster - R flank - with severe pain 03/11/2013  . History of shingles   . Hyperlipidemia   . Hypertension   . Hypothyroidism as  child   hx of  . Ileostomy - diverting loop - s/p takedown 06/28/2013 01/14/2013  . Mitral regurgitation due to partially flail posterior mitral valve leaflet   . Nocturia   . Numbness    TOES / FINGERS DUE TO CHEMO  . Peripheral vascular disease (HCC)    neuropathy due to cancer treatment (fingers and toes)  . Radiation    FOR COLON CANCER  . Rectal cancer ([place])   . Rheumatic fever as child   hx  . Sinusitis    seasonal  . Tinnitus    left ear  . Umbilical hernia, incarcerated - s/p primary repair July 2014 01/03/2013    Past Surgical History:  Procedure Laterality Date  . CARPAL TUNNEL RELEASE Right 2002  . ILEOSTOMY N/A 12/27/2012   Procedure: DIVERTING LOOP ILEOSTOMY;  Surgeon: Adin Hector, MD;  Location: [facility];  Service: General;  Laterality: N/A;  . ILEOSTOMY CLOSURE N/A 06/28/2013   Procedure: loop ILEOSTOMY TAKEDOWN examination of anesthesia ;  Surgeon: Adin Hector, MD;  Location: [facility];  Service: General;  Laterality: N/A;  . INGUINAL HERNIA REPAIR Left   . INGUINAL HERNIA REPAIR Bilateral 08/13/2015   Procedure: LAPAROSCOPIC BILATERAL INGUINAL HERNIA REPAIR WITH MESH;  Surgeon: Michael Boston, MD;  Location: [facility];  Service: General;  Laterality: Bilateral;  . INSERTION OF MESH Bilateral 08/13/2015   Procedure: INSERTION OF MESH;  Surgeon: Michael Boston, MD;  Location: [facility];  Service: General;  Laterality: Bilateral;  . LAPAROSCOPIC LOW ANTERIOR RESCECTION WITH COLOANAL ANASTOMOSIS N/A 12/27/2012   Procedure: LAPAROSCOPIC LOW ANTERIOR RESCECTION WITH COLOANAL ANASTOMOSIS;  Surgeon: Adin Hector, MD;  Location: [facility];  Service: General;  Laterality: N/A;  . LAPAROSCOPIC LOW ANTERIOR RESECTION N/A 12/27/2012   Procedure: LAPAROSCOPIC LOW ANTERIOR RESECTION, COLO-ANAL ANASTOMOSIS, DIVERTING LOOP ILEOSTOMY,SPLENIC FLEXURE MOBILIZATION, PRIMARY INCARCERATED UMBILICAL HERNIA REPAIR;  Surgeon: Adin Hector, MD;  Location: [facility];  Service: General;  Laterality: N/A;  .  TEE WITHOUT CARDIOVERSION N/A 07/10/2013   Procedure: TRANSESOPHAGEAL ECHOCARDIOGRAM (TEE);  Surgeon: Candee Furbish, MD;  Location: [facility] ENDOSCOPY;  Service: Cardiovascular;  Laterality: N/A;  . TEE WITHOUT CARDIOVERSION N/A 08/21/2013   Procedure: TRANSESOPHAGEAL ECHOCARDIOGRAM (TEE);  Surgeon: Sanda Klein, MD;  Location: [facility];  Service: Cardiovascular;  Laterality: N/A;  . TONSILLECTOMY     as child  . UMBILICAL HERNIA REPAIR  12/27/2012   Procedure: PRIMARY REPAIR INCARCERATED UMBILICAL HERNIA;  Surgeon: Adin Hector, MD;  Location: [facility];  Service: General;;     reports that he quit smoking about 46 years ago. His smoking use included cigarettes. He has a 30.00 pack-year smoking history. He has never used smokeless tobacco. He reports that he does not drink alcohol or use drugs.  Allergies  Allergen Reactions  . Shrimp [Shellfish Allergy] Hives, Nausea And Vomiting and Rash    Family History  Problem Relation Age of Onset  . Cancer Mother        abdominal ca ?ovarian?  . Heart disease Father   . Cancer Sister        liver cancer  . Heart disease Brother   . COPD Brother   . Healthy Son   . Healthy Son     Prior to Admission medications   Medication Sig Start Date End Date Taking? Authorizing Provider  acetaminophen (TYLENOL) 500 MG tablet Take 1,000 mg by mouth every 6 (six) hours as needed for headache.    Yes [provider]  diclofenac sodium (VOLTAREN) 1 % GEL Apply 4 g topically 4 (four) times daily. (for joint pain) 11/21/18  Yes Gottschalk, Ashly M, DO  hydrochlorothiazide (HYDRODIURIL) 25 MG tablet TAKE 1 TABLET BY MOUTH ONCE DAILY. STOPPING CHLORTHALIDONE. MED CHANGED 07/12/2018 Patient taking differently: Take 25 mg by mouth daily.  02/19/19  Yes Branch, Alphonse Guild, MD  hydrocortisone (PROCTO-MED HC) 2.5 % rectal cream Place 1 application rectally 2 (two) times daily. x7-10 days prn hemorrhoid (may need to use brand for ins coverage) Patient taking  differently: Place 1 application rectally 2 (two) times daily as needed for hemorrhoids or anal itching. x7-10 days prn hemorrhoid (may need to use brand for ins coverage) 05/24/19  Yes Gottschalk, Ashly M, DO  ketoconazole (NIZORAL) 2 % cream APPLY TOPICALLY TWICE DAILY Patient taking differently: Apply 1 application topically 2 (two) times daily.  09/11/18  Yes Claretta Fraise, MD  lisinopril (ZESTRIL) 40 MG tablet Take 1 tablet by mouth once daily Patient taking differently: Take 40 mg by mouth daily.  06/28/19  Yes Ronnie Doss M, DO  Multiple Vitamin (MULTIVITAMIN WITH MINERALS) TABS Take 1 tablet by mouth every morning.    Yes [provider]  oxymetazoline (AFRIN) 0.05 % nasal spray Place 1 spray into both nostrils 2 (two) times daily as needed for congestion.   Yes [provider]  pravastatin (PRAVACHOL) 20 MG tablet Take 1 tablet (20 mg total) by mouth daily. 05/22/18  Yes Eustaquio Maize, MD  Probiotic Product (DIGESTIVE ADVANTAGE PO) Take 1 tablet by mouth daily.   Yes [provider]  psyllium (METAMUCIL) 58.6 % packet Take 1 packet by mouth daily.   Yes [provider]  triamcinolone cream (KENALOG) 0.1 % APPLY TOPICALLY THREE TIMES DAILY Patient taking differently: Apply 1 application topically 3 (three) times daily.  01/18/19  Yes Janora Norlander, DO    Physical Exam: Vitals:   07/11/19 1900 07/11/19 1930 07/11/19 2000 07/11/19 2014  BP: 123/72  122/71   Pulse: (!) 122  (!) 127 (!) 126  Resp:      Temp:  (!) 102.7 F (39.3 C)    TempSrc:  Oral    SpO2: (!) 89%  94% 94%  Weight:      Height:        Constitutional: NAD, calm, comfortable Vitals:   07/11/19 1900 07/11/19 1930 07/11/19 2000 07/11/19 2014  BP: 123/72  122/71   Pulse: (!) 122  (!) 127 (!) 126  Resp:      Temp:  (!) 102.7 F (39.3 C)    TempSrc:  Oral    SpO2: (!) 89%  94% 94%  Weight:      Height:       Eyes: PERRL, lids and conjunctivae normal ENMT: Mucous  membranes are moist. Posterior pharynx clear of any exudate or lesions Neck: normal, supple, no masses Respiratory: normal respiratory effort. No accessory muscle use.  Cardiovascular: Tachycardic, regular rate and rhythm, No extremity edema. 2+ pedal pulses.   Abdomen: no tenderness, no masses palpated. No hepatosplenomegaly. Bowel sounds positive.  Musculoskeletal: no clubbing / cyanosis. No joint deformity upper and lower extremities. Good ROM, no contractures.  Skin: no rashes, lesions, ulcers. No induration Neurologic: No gross cranial nerve abnormality, moving all extremities spontaneously. Psychiatric: Normal judgment and insight. Alert and oriented x 3. Normal mood.   Labs on Admission: I have personally reviewed following labs and imaging studies  CBC: Recent Labs  Lab 07/11/19 1500  WBC 14.3*  NEUTROABS 12.3*  HGB 13.6  HCT 41.5  MCV 103.8*  PLT [id number]*   Basic Metabolic Panel: Recent Labs  Lab 07/11/19 1500  NA 138  K 4.3  CL 104  CO2 22  GLUCOSE 99  BUN 24*  CREATININE 1.14  CALCIUM 9.3   Liver Function Tests: Recent Labs  Lab 07/11/19 1500  AST 22  ALT 26  ALKPHOS 41  BILITOT 1.3*  PROT 6.6  ALBUMIN 4.4   Recent Labs  Lab 07/11/19 1500  LIPASE 24    Urine analysis:    Component Value Date/Time   COLORURINE YELLOW 07/11/2019 [place] 07/11/2019 1745   LABSPEC 1.018 07/11/2019 1745   PHURINE 5.0 07/11/2019 1745   GLUCOSEU NEGATIVE 07/11/2019 1745   HGBUR MODERATE (A) 07/11/2019 1745   BILIRUBINUR NEGATIVE 07/11/2019 1745   KETONESUR NEGATIVE 07/11/2019 1745   PROTEINUR NEGATIVE 07/11/2019 1745   UROBILINOGEN 0.2 08/17/2013 1752   NITRITE NEGATIVE 07/11/2019 1745   LEUKOCYTESUR NEGATIVE 07/11/2019 1745    Radiological Exams on Admission: CT Abdomen Pelvis W Contrast  Result Date: 07/11/2019 CLINICAL DATA:  Patient with abdominal pain. Prior partial colectomy. EXAM: CT ABDOMEN AND PELVIS WITH CONTRAST TECHNIQUE:  Multidetector CT imaging of the abdomen and pelvis was performed using the standard protocol following bolus administration of intravenous contrast. CONTRAST:  134mL OMNIPAQUE IOHEXOL 300 MG/ML  SOLN COMPARISON:  CT abdomen pelvis 07/20/2015 FINDINGS: Lower chest: Minimal atelectasis right lung base. No pleural effusion. Hepatobiliary:  Liver is normal in size and contour. Fatty deposition adjacent to the falciform ligament. Multiple stones in the gallbladder lumen. No intrahepatic or extrahepatic biliary ductal dilatation. Pancreas: Unremarkable Spleen: Unremarkable Adrenals/Urinary Tract: Normal adrenal glands. Kidneys are symmetric in size. No hydronephrosis. Mild urinary bladder wall thickening. Similar-appearing right renal cyst and additional subcentimeter too small to characterize low-attenuation renal lesions. Stomach/Bowel: Prior left hemicolectomy. Normal morphology of the stomach. No evidence for small bowel obstruction. No free fluid or free intraperitoneal air. Vascular/Lymphatic: Normal caliber abdominal aorta. Peripheral calcified atherosclerotic plaque no retroperitoneal lymphadenopathy. Reproductive: Heterogeneous prostate. Other: Postsurgical changes compatible with prior inguinal hernia repair. Musculoskeletal: Lumbar spine and lower thoracic spine degenerative changes. IMPRESSION: Mild urinary bladder wall thickening. Recommend correlation with urinalysis to exclude the possibility of urinary tract infection. Cholelithiasis without CT evidence for acute cholecystitis. Electronically Signed   By: Lovey Newcomer M.D.   On: 07/11/2019 17:53   DG Chest Portable 1 View  Result Date: 07/11/2019 CLINICAL DATA:  Fever EXAM: PORTABLE CHEST 1 VIEW COMPARISON:  08/17/2013 FINDINGS: Cardiac enlargement without heart failure. Lungs clear without infiltrate or effusion. Mediastinal contours normal. IMPRESSION: No active disease. Electronically Signed   By: Franchot Gallo M.D.   On: 07/11/2019 19:46    EKG:  None.   Assessment/Plan Active Problems:   SIRS (systemic inflammatory response syndrome) (HCC)   SIRS-with normal lactic acid.  T-max 102.7, tachycardic to 127, stable blood pressure.  Mild fluctuation O2 saturations. POC COVID-19 test negative.  Portable chest x-ray without acute abnormality.  CT abdomen and pelvis mild bladder wall thickening, correlation with UA recommended cholelithiasis without acute cholecystitis.  UA not suggestive of UTI.  Neck supple. - F/u respiratory panel -Follow-up blood and urine cultures -If positive blood cultures considering history of abnormal mitral valves, may need echocardiogram -Continue broad-spectrum antibiotics IV vancomycin, cefepime and metronidazole -CBC, BMP a.m. -579ml bolus given, continue N/s 100cc/hr x15hrs  Hypertension-stable. -Hold HCTZ for now in the setting of SIRS and while hydrating  History of mitral regurgitation-follows with Dr. [place] shows EF of 55 to 60%, moderate to severe mitral valve regurgitation, posterior leaflet prolapse and calcified density on atrial side of posterior leaflet-partial flail subsegment or old vegetation.  Questionable history of subacute bacterial endocarditis  08/2013  BPH-not on medication.  History of rectal cancer stage III, 2014,  in remission.  Status post radiation and chemotherapy, and resection.  Undergoing surveillance.  DVT prophylaxis: Lovenox Code Status: Full code Family Communication: None at bedside Disposition Plan: > 2 days, likely discharge to home, pending resolution of SIRS physiology and determination of etiology. Consults called: none Admission status: Inpatient, telemetry I certify that at the point of admission it is my clinical judgment that the patient will require inpatient hospital care spanning beyond 2 midnights from the point of admission due to high intensity of service, high risk for further deterioration and high frequency of surveillance required. The  following factors support the patient status of inpatient: SIRs requiring IV antibiotics pending blood culture results   Bethena Roys MD [facility]  07/11/2019, 10:32 PM

## 2019-07-11 NOTE — ED Notes (Signed)
Pt is aware we need urine sample, urinal at bedside.  

## 2019-07-12 DIAGNOSIS — R7881 Bacteremia: Secondary | ICD-10-CM | POA: Diagnosis present

## 2019-07-12 DIAGNOSIS — B9561 Methicillin susceptible Staphylococcus aureus infection as the cause of diseases classified elsewhere: Secondary | ICD-10-CM | POA: Diagnosis present

## 2019-07-12 HISTORY — DX: Bacteremia: R78.81

## 2019-07-12 HISTORY — DX: Methicillin susceptible Staphylococcus aureus infection as the cause of diseases classified elsewhere: B95.61

## 2019-07-12 LAB — BLOOD CULTURE ID PANEL (REFLEXED)

## 2019-07-12 LAB — BASIC METABOLIC PANEL
Anion gap: 10 (ref 5–15)
BUN: 23 mg/dL (ref 8–23)
CO2: 24 mmol/L (ref 22–32)
Calcium: 8.7 mg/dL — ABNORMAL LOW (ref 8.9–10.3)
Chloride: 102 mmol/L (ref 98–111)
Creatinine, Ser: 1.31 mg/dL — ABNORMAL HIGH (ref 0.61–1.24)
GFR calc Af Amer: >60 mL/min (ref >60)
GFR calc non Af Amer: 53 mL/min — ABNORMAL LOW (ref >60)
Glucose, Bld: 109 mg/dL — ABNORMAL HIGH (ref 70–99)
Potassium: 4 mmol/L (ref 3.5–5.1)
Sodium: 136 mmol/L (ref 135–145)

## 2019-07-12 LAB — CBC
HCT: 37.5 % — ABNORMAL LOW (ref 39.0–52.0)
Hemoglobin: 12.1 g/dL — ABNORMAL LOW (ref 13.0–17.0)
MCH: 33.5 pg (ref 26.0–34.0)
MCHC: 32.3 g/dL (ref 30.0–36.0)
MCV: 103.9 fL — ABNORMAL HIGH (ref 80.0–100.0)
Platelets: 128 10*3/uL — ABNORMAL LOW (ref 150–400)
RBC: 3.61 MIL/uL — ABNORMAL LOW (ref 4.22–5.81)
RDW: 14.1 % (ref 11.5–15.5)
WBC: 7.9 10*3/uL (ref 4.0–10.5)
nRBC: 0 % (ref 0.0–0.2)

## 2019-07-12 LAB — RESPIRATORY PANEL BY RT PCR (FLU A&B, COVID)
Influenza A by PCR: NEGATIVE
Influenza B by PCR: NEGATIVE
SARS Coronavirus 2 by RT PCR: NEGATIVE

## 2019-07-12 LAB — MRSA PCR SCREENING: MRSA by PCR: NEGATIVE

## 2019-07-12 LAB — SARS CORONAVIRUS 2 (TAT 6-24 HRS): SARS Coronavirus 2: NEGATIVE

## 2019-07-12 MED ORDER — SODIUM CHLORIDE 0.9 % IV SOLN: INTRAVENOUS | Status: DC

## 2019-07-12 MED ORDER — CEFAZOLIN SODIUM-DEXTROSE 2-4 GM/100ML-% IV SOLN
2.0000 g | Freq: Three times a day (TID) | INTRAVENOUS | Status: DC
  Administered 2019-07-12 – 2019-07-17 (×14): 2 g via INTRAVENOUS
  Filled 2019-07-12 (×15): qty 100

## 2019-07-12 MED ORDER — POLYETHYLENE GLYCOL 3350 17 G PO PACK: 17.0000 g | PACK | Freq: Every day | ORAL | Status: DC | PRN

## 2019-07-12 MED ORDER — SALINE SPRAY 0.65 % NA SOLN
1.0000 | NASAL | Status: DC | PRN
  Administered 2019-07-12: 1 via NASAL
  Filled 2019-07-12: qty 44

## 2019-07-12 MED ORDER — PRAVASTATIN SODIUM 10 MG PO TABS
20.0000 mg | ORAL_TABLET | Freq: Every day | ORAL | Status: DC
  Administered 2019-07-12 – 2019-07-17 (×5): 20 mg via ORAL
  Filled 2019-07-12 (×5): qty 2

## 2019-07-12 MED ORDER — ACETAMINOPHEN 650 MG RE SUPP: 650.0000 mg | Freq: Four times a day (QID) | RECTAL | Status: DC | PRN

## 2019-07-12 MED ORDER — ACETAMINOPHEN 325 MG PO TABS
650.0000 mg | ORAL_TABLET | Freq: Four times a day (QID) | ORAL | Status: DC
  Administered 2019-07-12 – 2019-07-17 (×19): 650 mg via ORAL
  Filled 2019-07-12 (×19): qty 2

## 2019-07-12 MED ORDER — ACETAMINOPHEN 650 MG RE SUPP: 650.0000 mg | Freq: Four times a day (QID) | RECTAL | Status: DC

## 2019-07-12 MED ORDER — METRONIDAZOLE IN NACL 5-0.79 MG/ML-% IV SOLN
500.0000 mg | Freq: Three times a day (TID) | INTRAVENOUS | Status: DC
  Administered 2019-07-12 (×2): 500 mg via INTRAVENOUS
  Filled 2019-07-12: qty 100

## 2019-07-12 MED ORDER — ENOXAPARIN SODIUM 40 MG/0.4ML ~~LOC~~ SOLN
40.0000 mg | SUBCUTANEOUS | Status: DC
  Administered 2019-07-12 – 2019-07-17 (×5): 40 mg via SUBCUTANEOUS
  Filled 2019-07-12 (×5): qty 0.4

## 2019-07-12 MED ORDER — METRONIDAZOLE IN NACL 5-0.79 MG/ML-% IV SOLN: 500.0000 mg | Freq: Three times a day (TID) | INTRAVENOUS | Status: DC

## 2019-07-12 MED ORDER — ONDANSETRON HCL 4 MG PO TABS: 4.0000 mg | ORAL_TABLET | Freq: Four times a day (QID) | ORAL | Status: DC | PRN

## 2019-07-12 MED ORDER — ONDANSETRON HCL 4 MG/2ML IJ SOLN: 4.0000 mg | Freq: Four times a day (QID) | INTRAMUSCULAR | Status: DC | PRN

## 2019-07-12 MED ORDER — ACETAMINOPHEN 325 MG PO TABS
650.0000 mg | ORAL_TABLET | Freq: Four times a day (QID) | ORAL | Status: DC | PRN
  Administered 2019-07-12: 650 mg via ORAL
  Filled 2019-07-12: qty 2

## 2019-07-12 NOTE — Progress Notes (Signed)
07/12/2019 3:23 PM  I was just notified by pharmacy that patient had a positive blood culture that is MSSA by [facility]. Switching antibiotics to cefazolin.  He will need a TEE given his mitral valve disease.  2D echo ordered but will ask cardiology to see him Monday for TEE.    Murvin Natal MD

## 2019-07-12 NOTE — Progress Notes (Signed)
PROGRESS NOTE [facility]   Tyler Diaz  [id number]  DOB: May 30, 1946  DOA: 07/11/2019 PCP: Janora Norlander, DO   Brief Admission Hx: 74 y.o. male with medical history significant for severe mitral valve disease, hypertension, colon cancer, reported history of subacute bacterial endocarditis, mitral regurgitation.  Patient presented to the ED with complaints of abdominal pain, mostly left-sided, that has mostly resolved at this time.  He was admitted with fever and SIRS of unknown origin.   MDM/Assessment & Plan:   1. SIRS -he presented with fever and tachycardia.  He has been started on broad-spectrum antibiotic therapy.  His WBC has improved to normal and his fever is defervesced.  He reports that he is starting to feel better.  His abdominal pain is slowly improving.  Discontinue vancomycin as MRSA screen was negative.  Continue IV fluid hydration.  Plan to recheck portable chest x-ray in a.m. SARS 2 coronavirus test negative. 2. History of severe mitral regurgitation-he is followed closely by Dr. Harl Bowie.  He had an echocardiogram done in October 2020.  Follow-up blood cultures closely. 3. Hypertension-monitor blood pressures closely, temporarily holding HCTZ. 4. History of rectal cancer stage III diagnosed in 2014 he is currently in remission status post radiation and chemotherapy and resection.  DVT prophylaxis: Lovenox Code Status: Full Family Communication: Patient updated at bedside, verbalizes understanding Disposition Plan: Continue inpatient IV antibiotics and supportive therapy, repeat x-ray imaging tomorrow   Consultants:    Procedures:    Antimicrobials:     Subjective: Pt reports that his LLQ abdominal pain persists but is getting better.  No chills.  No diarrhea.  No cough.    Objective: Vitals:   07/12/19 0100 07/12/19 0130 07/12/19 0219 07/12/19 0503  BP: 134/69  132/74 110/67  Pulse: 100 (!) 101 (!) 114 (!) 104  Resp:   18 16  Temp:   (!)  100.8 F (38.2 C) 98.4 F (36.9 C)  TempSrc:   Oral Oral  SpO2: 93% 92% 94% 98%  Weight:   83.5 kg   Height:   5\' 10"  (1.778 m)     Intake/Output Summary (Last 24 hours) at 07/12/2019 1227 Last data filed at 07/12/2019 0400 Gross per 24 hour  Intake 640.24 ml  Output -  Net 640.24 ml   Filed Weights   07/11/19 1441 07/12/19 0219  Weight: 81.6 kg 83.5 kg     REVIEW OF SYSTEMS  As per history otherwise all reviewed and reported negative  Exam:  General exam: Sitting up in the bed he is awake and alert oriented x3 in no apparent distress.  Cooperative. Respiratory system: Clear. No increased work of breathing. Cardiovascular system: S1 & S2 heard. No JVD, murmurs, gallops, clicks or pedal edema. Gastrointestinal system: Abdomen is nondistended, soft and very mild left lower quadrant tenderness with deep palpation no guarding or rebound tenderness.  No rash noted on the skin. Normal bowel sounds heard. Central nervous system: Alert and oriented. No focal neurological deficits. Extremities: no CCE.  Data Reviewed: Basic Metabolic Panel: Recent Labs  Lab 07/11/19 1500 07/12/19 0625  NA 138 136  K 4.3 4.0  CL 104 102  CO2 22 24  GLUCOSE 99 109*  BUN 24* 23  CREATININE 1.14 1.31*  CALCIUM 9.3 8.7*   Liver Function Tests: Recent Labs  Lab 07/11/19 1500  AST 22  ALT 26  ALKPHOS 41  BILITOT 1.3*  PROT 6.6  ALBUMIN 4.4   Recent Labs  Lab 07/11/19 1500  LIPASE 24   No results for input(s): AMMONIA in the last 168 hours. CBC: Recent Labs  Lab 07/11/19 1500 07/12/19 0625  WBC 14.3* 7.9  NEUTROABS 12.3*  --   HGB 13.6 12.1*  HCT 41.5 37.5*  MCV 103.8* 103.9*  PLT 134* 128*   Cardiac Enzymes: No results for input(s): CKTOTAL, CKMB, CKMBINDEX, TROPONINI in the last 168 hours. CBG (last 3)  No results for input(s): GLUCAP in the last 72 hours. Recent Results (from the past 240 hour(s))  Blood culture (routine x 2)     Status: None (Preliminary result)    Collection Time: 07/11/19  8:14 PM   Specimen: BLOOD  Result Value Ref Range Status   Specimen Description BLOOD LEFT ANTECUBITAL  Final   Special Requests   Final    BOTTLES DRAWN AEROBIC AND ANAEROBIC Blood Culture adequate volume   Culture  Setup Time   Final    GRAM POSITIVE COCCI IN BOTH AEROBIC AND ANAEROBIC BOTTLES Gram Stain Report Called to,Read Back By and Verified With: ROSE J. AT 1010A ON [id number] BY THOMPSON S. Performed at [facility], [address] [place], [place] [address]    Culture PENDING  Incomplete   Report Status PENDING  Incomplete  Blood culture (routine x 2)     Status: None (Preliminary result)   Collection Time: 07/11/19  8:19 PM   Specimen: BLOOD RIGHT HAND  Result Value Ref Range Status   Specimen Description BLOOD RIGHT HAND  Final   Special Requests   Final    BOTTLES DRAWN AEROBIC AND ANAEROBIC Blood Culture adequate volume   Culture  Setup Time   Final    GRAM POSITIVE COCCI IN BOTH AEROBIC AND ANAEROBIC BOTTLES Gram Stain Report Called to,Read Back By and Verified With: ROSE J. AT 1010A ON [id number] BY THOMPSON S. Performed at [facility], [address], [place], [place] [address]    Culture PENDING  Incomplete   Report Status PENDING  Incomplete  Respiratory Panel by RT PCR (Flu A&B, Covid) - Nasopharyngeal Swab     Status: None   Collection Time: 07/11/19 11:07 PM   Specimen: Nasopharyngeal Swab  Result Value Ref Range Status   SARS Coronavirus 2 by RT PCR NEGATIVE NEGATIVE Final    Comment: (NOTE) SARS-CoV-2 target nucleic acids are NOT DETECTED. The SARS-CoV-2 RNA is generally detectable in upper respiratoy specimens during the acute phase of infection. The lowest concentration of SARS-CoV-2 viral copies this assay can detect is 131 copies/mL. A negative result does not preclude SARS-Cov-2 infection and should not be used as the sole basis for treatment or other patient management decisions. A negative result may occur with  improper  specimen collection/handling, submission of specimen other than nasopharyngeal swab, presence of viral mutation(s) within the areas targeted by this assay, and inadequate number of viral copies (<131 copies/mL). A negative result must be combined with clinical observations, patient history, and epidemiological information. The expected result is Negative. Fact Sheet for Patients:  PinkCheek.be Fact Sheet for Healthcare Providers:  GravelBags.it This test is not yet ap proved or cleared by the [place] FDA and  has been authorized for detection and/or diagnosis of SARS-CoV-2 by FDA under an Emergency Use Authorization (EUA). This EUA will remain  in effect (meaning this test can be used) for the duration of the COVID-19 declaration under Section 564(b)(1) of the Act, 21 U.S.C. section 360bbb-3(b)(1), unless the authorization is terminated or revoked sooner.    Influenza A by PCR  NEGATIVE NEGATIVE Final   Influenza B by PCR NEGATIVE NEGATIVE Final    Comment: (NOTE) The Xpert Xpress SARS-CoV-2/FLU/RSV assay is intended as an aid in  the diagnosis of influenza from Nasopharyngeal swab specimens and  should not be used as a sole basis for treatment. Nasal washings and  aspirates are unacceptable for Xpert Xpress SARS-CoV-2/FLU/RSV  testing. Fact Sheet for Patients: PinkCheek.be Fact Sheet for Healthcare Providers: GravelBags.it This test is not yet approved or cleared by the [place] FDA and  has been authorized for detection and/or diagnosis of SARS-CoV-2 by  FDA under an Emergency Use Authorization (EUA). This EUA will remain  in effect (meaning this test can be used) for the duration of the  Covid-19 declaration under Section 564(b)(1) of the Act, 21  U.S.C. section 360bbb-3(b)(1), unless the authorization is  terminated or revoked. Performed at [facility], [address], [place], [place] [address]   MRSA PCR Screening     Status: None   Collection Time: 07/12/19 10:30 AM   Specimen: Nasal Mucosa; Nasopharyngeal  Result Value Ref Range Status   MRSA by PCR NEGATIVE NEGATIVE Final    Comment:        The GeneXpert MRSA Assay (FDA approved for NASAL specimens only), is one component of a comprehensive MRSA colonization surveillance program. It is not intended to diagnose MRSA infection nor to guide or monitor treatment for MRSA infections. Performed at [facility], [address], [place], [place] [address]      Studies: CT Abdomen Pelvis W Contrast  Result Date: 07/11/2019 CLINICAL DATA:  Patient with abdominal pain. Prior partial colectomy. EXAM: CT ABDOMEN AND PELVIS WITH CONTRAST TECHNIQUE: Multidetector CT imaging of the abdomen and pelvis was performed using the standard protocol following bolus administration of intravenous contrast. CONTRAST:  172mL OMNIPAQUE IOHEXOL 300 MG/ML  SOLN COMPARISON:  CT abdomen pelvis 07/20/2015 FINDINGS: Lower chest: Minimal atelectasis right lung base. No pleural effusion. Hepatobiliary: Liver is normal in size and contour. Fatty deposition adjacent to the falciform ligament. Multiple stones in the gallbladder lumen. No intrahepatic or extrahepatic biliary ductal dilatation. Pancreas: Unremarkable Spleen: Unremarkable Adrenals/Urinary Tract: Normal adrenal glands. Kidneys are symmetric in size. No hydronephrosis. Mild urinary bladder wall thickening. Similar-appearing right renal cyst and additional subcentimeter too small to characterize low-attenuation renal lesions. Stomach/Bowel: Prior left hemicolectomy. Normal morphology of the stomach. No evidence for small bowel obstruction. No free fluid or free intraperitoneal air. Vascular/Lymphatic: Normal caliber abdominal aorta. Peripheral calcified atherosclerotic plaque no retroperitoneal lymphadenopathy. Reproductive: Heterogeneous prostate. Other:  Postsurgical changes compatible with prior inguinal hernia repair. Musculoskeletal: Lumbar spine and lower thoracic spine degenerative changes. IMPRESSION: Mild urinary bladder wall thickening. Recommend correlation with urinalysis to exclude the possibility of urinary tract infection. Cholelithiasis without CT evidence for acute cholecystitis. Electronically Signed   By: Lovey Newcomer M.D.   On: 07/11/2019 17:53   DG Chest Portable 1 View  Result Date: 07/11/2019 CLINICAL DATA:  Fever EXAM: PORTABLE CHEST 1 VIEW COMPARISON:  08/17/2013 FINDINGS: Cardiac enlargement without heart failure. Lungs clear without infiltrate or effusion. Mediastinal contours normal. IMPRESSION: No active disease. Electronically Signed   By: Franchot Gallo M.D.   On: 07/11/2019 19:46     Scheduled Meds: . acetaminophen  650 mg Oral Q6H   Or  . acetaminophen  650 mg Rectal Q6H  . enoxaparin (LOVENOX) injection  40 mg Subcutaneous Q24H  . pravastatin  20 mg Oral Daily   Continuous Infusions: . sodium chloride    .  ceFEPime (MAXIPIME) IV 2 g (07/12/19 1022)  . metronidazole 500 mg (07/12/19 1126)    Active Problems:   Hypertension   Mitral regurgitation due to partially flail posterior mitral valve leaflet   History of SBE (subacute bacterial endocarditis)   SIRS (systemic inflammatory response syndrome) ([facility])  Time spent:   Irwin Brakeman, MD [facility] 07/12/2019, 12:27 PM    LOS: 1 day  How to contact the [facility] Attending or Consulting provider [place] or covering provider during after hours [place], for this patient?  1. Check the care team in [facility] and look for a) attending/consulting TR[facility] provider listed and b) the [facility] team listed 2. Log into [email or web address] and use [place]'s universal password to access. If you do not have the password, please contact the hospital operator. 3. Locate the [facility] provider you are looking for under [facility] and page to a number that you can be directly reached.  4. If you still have difficulty reaching the provider, please page the [facility] (Director on Call) for the Hospitalists listed on amion for assistance.

## 2019-07-12 NOTE — Progress Notes (Signed)
MD notified of increased MEWS score due to temperature.  Tylenol given.  No new orders received at this time. Will continue to monitor patient.

## 2019-07-12 NOTE — Evaluation (Signed)
Physical Therapy Evaluation Patient Details Name: Tyler Diaz MRN: [id number] DOB: [birth year] Today's Date: 07/12/2019   History of Present Illness  Tyler Diaz is a 74 y.o. male with medical history significant for hypertension, colon cancer, subacute bacterial endocarditis, mitral regurgitation.Patient presented to the ED with complaints of abdominal pain, mostly left-sided, that has mostly resolved at this time. One episode of loose stool last night, and fever to 102 at home.  No vomiting.  No pain with urination.  He has some mild chronic neck pain that is unchanged, no neck stiffness.  He denies cough, no difficulty breathing.  No known Covid positive contacts.    Clinical Impression  Patient functioning at baseline for functional mobility and gait.  Plan:  Patient discharged from physical therapy to care of nursing for ambulation daily as tolerated for length of stay.     Follow Up Recommendations No PT follow up    Equipment Recommendations  None recommended by PT    Recommendations for Other Services       Precautions / Restrictions Precautions Precautions: None Restrictions Weight Bearing Restrictions: No      Mobility  Bed Mobility Overal bed mobility: Independent                Transfers Overall transfer level: Modified independent               General transfer comment: slightly increased time  Ambulation/Gait Ambulation/Gait assistance: Modified independent (Device/Increase time) Gait Distance (Feet): 200 Feet Assistive device: None Gait Pattern/deviations: WFL(Within Functional Limits) Gait velocity: slightly decreased   General Gait Details: demonstrates good return for ambulation on level, inclined and declined surfaces without loss of balance  Stairs Stairs: Yes Stairs assistance: Modified independent (Device/Increase time) Stair Management: One rail Right;One rail Left;Alternating pattern Number of Stairs: 3 General stair comments:  demonstrates good return for going up/down 3 steps using 1 siderail without loss of balance  Wheelchair Mobility    Modified Rankin (Stroke Patients Only)       Balance Overall balance assessment: No apparent balance deficits (not formally assessed)                                           Pertinent Vitals/Pain Pain Assessment: No/denies pain    Home Living Family/patient expects to be discharged to:: Private residence Living Arrangements: Children Available Help at Discharge: Family;Available 24 hours/day Type of Home: House Home Access: Stairs to enter Entrance Stairs-Rails: None Entrance Stairs-Number of Steps: 4 Home Layout: One level Home Equipment: Grab bars - tub/shower      Prior Function Level of Independence: Independent         Comments: Hydrographic surveyor, drives     Hand Dominance   Dominant Hand: Right    Extremity/Trunk Assessment   Upper Extremity Assessment Upper Extremity Assessment: Overall WFL for tasks assessed    Lower Extremity Assessment Lower Extremity Assessment: Overall WFL for tasks assessed    Cervical / Trunk Assessment Cervical / Trunk Assessment: Normal  Communication   Communication: HOH  Cognition Arousal/Alertness: Awake/alert Behavior During Therapy: WFL for tasks assessed/performed Overall Cognitive Status: Within Functional Limits for tasks assessed  General Comments      Exercises     Assessment/Plan    PT Assessment Patent does not need any further PT services  PT Problem List         PT Treatment Interventions      PT Goals (Current goals can be found in the Care Plan section)  Acute Rehab PT Goals Patient Stated Goal: return home with family to assist PT Goal Formulation: With patient/family Time For Goal Achievement: 07/12/19 Potential to Achieve Goals: Good    Frequency     Barriers to discharge         Co-evaluation               AM-PAC PT "6 Clicks" Mobility  Outcome Measure Help needed turning from your back to your side while in a flat bed without using bedrails?: None Help needed moving from lying on your back to sitting on the side of a flat bed without using bedrails?: None Help needed moving to and from a bed to a chair (including a wheelchair)?: None Help needed standing up from a chair using your arms (e.g., wheelchair or bedside chair)?: None Help needed to walk in hospital room?: None Help needed climbing 3-5 steps with a railing? : None 6 Click Score: 24    End of Session   Activity Tolerance: Patient tolerated treatment well Patient left: in bed;with call bell/phone within reach;with family/visitor present Nurse Communication: Mobility status PT Visit Diagnosis: Unsteadiness on feet (R26.81);Other abnormalities of gait and mobility (R26.89);Muscle weakness (generalized) (M62.81)    Time: [id number] PT Time Calculation (min) (ACUTE ONLY): 22 min   Charges:   PT Evaluation $PT Eval Moderate Complexity: 1 Mod PT Treatments $Therapeutic Activity: 23-37 mins        12:25 PM, 07/12/19 Lonell Grandchild, MPT Physical Therapist with [facility] [phone] office [phone] mobile phone

## 2019-07-12 NOTE — Care Management Important Message (Signed)
Important Message  Patient Details  Name: Tyler Diaz MRN: [id number] Date of Birth: [birth year]   Medicare Important Message Given:  Yes(refused to sign)     Tommy Medal 07/12/2019, 2:14 PM

## 2019-07-13 ENCOUNTER — Inpatient Hospital Stay (HOSPITAL_COMMUNITY): Payer: PPO

## 2019-07-13 DIAGNOSIS — R7881 Bacteremia: Secondary | ICD-10-CM

## 2019-07-13 DIAGNOSIS — Z8679 Personal history of other diseases of the circulatory system: Secondary | ICD-10-CM

## 2019-07-13 DIAGNOSIS — B9561 Methicillin susceptible Staphylococcus aureus infection as the cause of diseases classified elsewhere: Secondary | ICD-10-CM

## 2019-07-13 LAB — URINE CULTURE

## 2019-07-13 MED ORDER — SACCHAROMYCES BOULARDII 250 MG PO CAPS
250.0000 mg | ORAL_CAPSULE | Freq: Two times a day (BID) | ORAL | Status: DC
  Administered 2019-07-13 – 2019-07-17 (×7): 250 mg via ORAL
  Filled 2019-07-13 (×7): qty 1

## 2019-07-13 MED ORDER — DIPHENOXYLATE-ATROPINE 2.5-0.025 MG PO TABS
1.0000 | ORAL_TABLET | Freq: Four times a day (QID) | ORAL | Status: DC | PRN
  Administered 2019-07-13: 2 via ORAL
  Administered 2019-07-15 – 2019-07-16 (×2): 1 via ORAL
  Filled 2019-07-13 (×2): qty 2
  Filled 2019-07-13 (×2): qty 1

## 2019-07-13 NOTE — Progress Notes (Signed)
PROGRESS NOTE [facility]  Tyler Diaz  [id number]  DOB: Dec 01, 1945  DOA: 07/11/2019 PCP: Janora Norlander, DO  Brief Admission Hx: 74 y.o. male with medical history significant for severe mitral valve disease, hypertension, colon cancer, reported history of subacute bacterial endocarditis, mitral regurgitation.  Patient presented to the ED with complaints of abdominal pain, mostly left-sided, that has mostly resolved at this time.  He was admitted with fever and SIRS of unknown origin.   MDM/Assessment & Plan:   1. Sepsis likely secondary to presumed endocarditis / staph aureus bacteremia -he presented with fever and tachycardia.  He has been started on broad-spectrum antibiotic therapy.  His WBC has improved to normal and his fever is defervesced.  He reports that he is starting to feel better.  His abdominal pain is slowly improving.  Discontinue vancomycin as MRSA screen was negative.  Continue IV fluid hydration. SARS 2 coronavirus test negative. 2. Staph aureus bacteremia - IV antibiotics changed to cefazolin.  Repeat Blood Cultures 07/14/19 to document clearance of infection and plan on PICC line after repeat cultures negative x 24 to 48 hours.  I spoke with Dr. Bronson Ing and arranging for tentatively planned TEE for 07/15/19.  Appreciate ID team assistance with management.     3. History of severe mitral regurgitation-he is followed closely by Dr. Harl Bowie.  He had an echocardiogram done in October 2020.  4. Hypertension-monitor blood pressures closely, temporarily holding HCTZ. 5. History of rectal cancer stage III diagnosed in 2014 he is currently in remission status post radiation and chemotherapy and resection.  DVT prophylaxis: Lovenox Code Status: Full Family Communication: son was at bedside and updated, verbalized understanding Disposition Plan: Continue inpatient IV antibiotics and supportive therapy  Consultants:    Procedures:    Antimicrobials:      Subjective: Pt says that his LLQ abdominal pain seems to be improving.      Objective: Vitals:   07/12/19 1450 07/12/19 2143 07/13/19 0501 07/13/19 0802  BP: 101/60 125/72 112/68   Pulse: 93 90 93   Resp: 20 20 20    Temp: 98.4 F (36.9 C) 98.8 F (37.1 C) 98.4 F (36.9 C)   TempSrc:  Oral Oral   SpO2: 97% 98% 97% 96%  Weight:      Height:        Intake/Output Summary (Last 24 hours) at 07/13/2019 1403 Last data filed at 07/13/2019 [id number] Gross per 24 hour  Intake 1102 ml  Output 800 ml  Net 302 ml   Filed Weights   07/11/19 1441 07/12/19 0219  Weight: 81.6 kg 83.5 kg     REVIEW OF SYSTEMS  As per history otherwise all reviewed and reported negative  Exam:  General exam: Sitting up in the bed he is awake and alert oriented x3 in no apparent distress.  Cooperative. Respiratory system: Clear. No increased work of breathing. Cardiovascular system: S1 & S2 heard. No JVD, murmurs, gallops, clicks or pedal edema. Gastrointestinal system: Abdomen is nondistended, soft and very mild left lower quadrant tenderness with deep palpation no guarding or rebound tenderness.  No rash noted on the skin. Normal bowel sounds heard. Central nervous system: Alert and oriented. No focal neurological deficits. Extremities: no CCE.  Data Reviewed: Basic Metabolic Panel: Recent Labs  Lab 07/11/19 1500 07/12/19 0625  NA 138 136  K 4.3 4.0  CL 104 102  CO2 22 24  GLUCOSE 99 109*  BUN 24* 23  CREATININE 1.14 1.31*  CALCIUM 9.3 8.7*  Liver Function Tests: Recent Labs  Lab 07/11/19 1500  AST 22  ALT 26  ALKPHOS 41  BILITOT 1.3*  PROT 6.6  ALBUMIN 4.4   Recent Labs  Lab 07/11/19 1500  LIPASE 24   No results for input(s): AMMONIA in the last 168 hours. CBC: Recent Labs  Lab 07/11/19 1500 07/12/19 0625  WBC 14.3* 7.9  NEUTROABS 12.3*  --   HGB 13.6 12.1*  HCT 41.5 37.5*  MCV 103.8* 103.9*  PLT 134* 128*   Cardiac Enzymes: No results for input(s): CKTOTAL, CKMB,  CKMBINDEX, TROPONINI in the last 168 hours. CBG (last 3)  No results for input(s): GLUCAP in the last 72 hours. Recent Results (from the past 240 hour(s))  Blood culture (routine x 2)     Status: Abnormal (Preliminary result)   Collection Time: 07/11/19  8:14 PM   Specimen: BLOOD  Result Value Ref Range Status   Specimen Description   Final    BLOOD LEFT ANTECUBITAL Performed at [facility], [address] [place], [place] [address]    Special Requests   Final    BOTTLES DRAWN AEROBIC AND ANAEROBIC Blood Culture adequate volume Performed at [place] [place], [place] [address]    Culture  Setup Time   Final    GRAM POSITIVE COCCI IN BOTH AEROBIC AND ANAEROBIC BOTTLES Gram Stain Report Called to,Read Back By and Verified With: ROSE J. AT 1010A ON [id number] BY THOMPSON S. CRITICAL RESULT CALLED TO, READ BACK BY AND VERIFIED WITH: STEVEN HURGHT @1503  07/12/19 AKT    Culture (A)  Final    STAPHYLOCOCCUS AUREUS SUSCEPTIBILITIES TO FOLLOW Performed at [facility], [place] [address] [place], [place] [address]    Report Status PENDING  Incomplete  Blood Culture ID Panel (Reflexed)     Status: Abnormal   Collection Time: 07/11/19  8:14 PM  Result Value Ref Range Status   Enterococcus species NOT DETECTED NOT DETECTED Final   Listeria monocytogenes NOT DETECTED NOT DETECTED Final   Staphylococcus species DETECTED (A) NOT DETECTED Final    Comment: CRITICAL RESULT CALLED TO, READ BACK BY AND VERIFIED WITH: PHARMD STEVEN HURGHT @1503  07/12/19 AKT    Staphylococcus aureus (BCID) DETECTED (A) NOT DETECTED Final    Comment: Methicillin (oxacillin) susceptible Staphylococcus aureus (MSSA). Preferred therapy is anti staphylococcal beta lactam antibiotic (Cefazolin or Nafcillin), unless clinically contraindicated. CRITICAL RESULT CALLED TO, READ BACK BY AND VERIFIED WITH: PHARMD STEVEN HURGHT @1503  07/12/19 AKT    Methicillin resistance NOT DETECTED NOT DETECTED Final    Streptococcus species NOT DETECTED NOT DETECTED Final   Streptococcus agalactiae NOT DETECTED NOT DETECTED Final   Streptococcus pneumoniae NOT DETECTED NOT DETECTED Final   Streptococcus pyogenes NOT DETECTED NOT DETECTED Final   Acinetobacter baumannii NOT DETECTED NOT DETECTED Final   Enterobacteriaceae species NOT DETECTED NOT DETECTED Final   Enterobacter cloacae complex NOT DETECTED NOT DETECTED Final   Escherichia coli NOT DETECTED NOT DETECTED Final   Klebsiella oxytoca NOT DETECTED NOT DETECTED Final   Klebsiella pneumoniae NOT DETECTED NOT DETECTED Final   Proteus species NOT DETECTED NOT DETECTED Final   Serratia marcescens NOT DETECTED NOT DETECTED Final   Haemophilus influenzae NOT DETECTED NOT DETECTED Final   Neisseria meningitidis NOT DETECTED NOT DETECTED Final   Pseudomonas aeruginosa NOT DETECTED NOT DETECTED Final   Candida albicans NOT DETECTED NOT DETECTED Final   Candida glabrata NOT DETECTED NOT DETECTED Final   Candida krusei NOT DETECTED NOT DETECTED  Final   Candida parapsilosis NOT DETECTED NOT DETECTED Final   Candida tropicalis NOT DETECTED NOT DETECTED Final    Comment: Performed at [facility], [place] [address] [place], [place] [address]  Blood culture (routine x 2)     Status: Abnormal (Preliminary result)   Collection Time: 07/11/19  8:19 PM   Specimen: BLOOD RIGHT HAND  Result Value Ref Range Status   Specimen Description   Final    BLOOD RIGHT HAND Performed at [facility], [address], [place], [place] [address]    Special Requests   Final    BOTTLES DRAWN AEROBIC AND ANAEROBIC Blood Culture adequate volume Performed at [facility], [address], [place], [place] [address]    Culture  Setup Time   Final    GRAM POSITIVE COCCI IN BOTH AEROBIC AND ANAEROBIC BOTTLES Gram Stain Report Called to,Read Back By and Verified With: ROSE J. AT 1010A ON [id number] BY THOMPSON S. Performed at [facility], [address] [place], [place] [address]      Culture STAPHYLOCOCCUS AUREUS (A)  Final   Report Status PENDING  Incomplete  SARS CORONAVIRUS 2 (TAT 6-24 HRS) Nasopharyngeal Nasopharyngeal Swab     Status: None   Collection Time: 07/11/19  8:55 PM   Specimen: Nasopharyngeal Swab  Result Value Ref Range Status   SARS Coronavirus 2 NEGATIVE NEGATIVE Final    Comment: (NOTE) SARS-CoV-2 target nucleic acids are NOT DETECTED. The SARS-CoV-2 RNA is generally detectable in upper and lower respiratory specimens during the acute phase of infection. Negative results do not preclude SARS-CoV-2 infection, do not rule out co-infections with other pathogens, and should not be used as the sole basis for treatment or other patient management decisions. Negative results must be combined with clinical observations, patient history, and epidemiological information. The expected result is Negative. Fact Sheet for Patients: SugarRoll.be Fact Sheet for Healthcare Providers: https://www.woods-mathews.com/ This test is not yet approved or cleared by the [place] FDA and  has been authorized for detection and/or diagnosis of SARS-CoV-2 by FDA under an Emergency Use Authorization (EUA). This EUA will remain  in effect (meaning this test can be used) for the duration of the COVID-19 declaration under Section 56 4(b)(1) of the Act, 21 U.S.C. section 360bbb-3(b)(1), unless the authorization is terminated or revoked sooner. Performed at [facility], [place] [address] [place], [place] [address]   Culture, Urine     Status: Abnormal   Collection Time: 07/11/19  9:34 PM   Specimen: Urine, Clean Catch  Result Value Ref Range Status   Specimen Description   Final    URINE, CLEAN CATCH Performed at [facility], [address], [place], [place] [address]    Special Requests   Final    NONE Performed at [facility], [address], [place], [place] [address]    Culture MULTIPLE SPECIES PRESENT, SUGGEST  RECOLLECTION (A)  Final   Report Status 07/13/2019 FINAL  Final  Respiratory Panel by RT PCR (Flu A&B, Covid) - Nasopharyngeal Swab     Status: None   Collection Time: 07/11/19 11:07 PM   Specimen: Nasopharyngeal Swab  Result Value Ref Range Status   SARS Coronavirus 2 by RT PCR NEGATIVE NEGATIVE Final    Comment: (NOTE) SARS-CoV-2 target nucleic acids are NOT DETECTED. The SARS-CoV-2 RNA is generally detectable in upper respiratoy specimens during the acute phase of infection. The lowest concentration of SARS-CoV-2 viral copies this assay can detect is 131 copies/mL. A  negative result does not preclude SARS-Cov-2 infection and should not be used as the sole basis for treatment or other patient management decisions. A negative result may occur with  improper specimen collection/handling, submission of specimen other than nasopharyngeal swab, presence of viral mutation(s) within the areas targeted by this assay, and inadequate number of viral copies (<131 copies/mL). A negative result must be combined with clinical observations, patient history, and epidemiological information. The expected result is Negative. Fact Sheet for Patients:  PinkCheek.be Fact Sheet for Healthcare Providers:  GravelBags.it This test is not yet ap proved or cleared by the [place] FDA and  has been authorized for detection and/or diagnosis of SARS-CoV-2 by FDA under an Emergency Use Authorization (EUA). This EUA will remain  in effect (meaning this test can be used) for the duration of the COVID-19 declaration under Section 564(b)(1) of the Act, 21 U.S.C. section 360bbb-3(b)(1), unless the authorization is terminated or revoked sooner.    Influenza A by PCR NEGATIVE NEGATIVE Final   Influenza B by PCR NEGATIVE NEGATIVE Final    Comment: (NOTE) The Xpert Xpress SARS-CoV-2/FLU/RSV assay is intended as an aid in  the diagnosis of influenza from  Nasopharyngeal swab specimens and  should not be used as a sole basis for treatment. Nasal washings and  aspirates are unacceptable for Xpert Xpress SARS-CoV-2/FLU/RSV  testing. Fact Sheet for Patients: PinkCheek.be Fact Sheet for Healthcare Providers: GravelBags.it This test is not yet approved or cleared by the [place] FDA and  has been authorized for detection and/or diagnosis of SARS-CoV-2 by  FDA under an Emergency Use Authorization (EUA). This EUA will remain  in effect (meaning this test can be used) for the duration of the  Covid-19 declaration under Section 564(b)(1) of the Act, 21  U.S.C. section 360bbb-3(b)(1), unless the authorization is  terminated or revoked. Performed at [facility], [address] [place], [place] [address]   MRSA PCR Screening     Status: None   Collection Time: 07/12/19 10:30 AM   Specimen: Nasal Mucosa; Nasopharyngeal  Result Value Ref Range Status   MRSA by PCR NEGATIVE NEGATIVE Final    Comment:        The GeneXpert MRSA Assay (FDA approved for NASAL specimens only), is one component of a comprehensive MRSA colonization surveillance program. It is not intended to diagnose MRSA infection nor to guide or monitor treatment for MRSA infections. Performed at [facility], [address] [place], [place] [address]      Studies: CT Abdomen Pelvis W Contrast  Result Date: 07/11/2019 CLINICAL DATA:  Patient with abdominal pain. Prior partial colectomy. EXAM: CT ABDOMEN AND PELVIS WITH CONTRAST TECHNIQUE: Multidetector CT imaging of the abdomen and pelvis was performed using the standard protocol following bolus administration of intravenous contrast. CONTRAST:  139mL OMNIPAQUE IOHEXOL 300 MG/ML  SOLN COMPARISON:  CT abdomen pelvis 07/20/2015 FINDINGS: Lower chest: Minimal atelectasis right lung base. No pleural effusion. Hepatobiliary: Liver is normal in size and contour. Fatty  deposition adjacent to the falciform ligament. Multiple stones in the gallbladder lumen. No intrahepatic or extrahepatic biliary ductal dilatation. Pancreas: Unremarkable Spleen: Unremarkable Adrenals/Urinary Tract: Normal adrenal glands. Kidneys are symmetric in size. No hydronephrosis. Mild urinary bladder wall thickening. Similar-appearing right renal cyst and additional subcentimeter too small to characterize low-attenuation renal lesions. Stomach/Bowel: Prior left hemicolectomy. Normal morphology of the stomach. No evidence for small bowel obstruction. No free fluid or free intraperitoneal air. Vascular/Lymphatic: Normal caliber abdominal aorta. Peripheral calcified atherosclerotic plaque no retroperitoneal  lymphadenopathy. Reproductive: Heterogeneous prostate. Other: Postsurgical changes compatible with prior inguinal hernia repair. Musculoskeletal: Lumbar spine and lower thoracic spine degenerative changes. IMPRESSION: Mild urinary bladder wall thickening. Recommend correlation with urinalysis to exclude the possibility of urinary tract infection. Cholelithiasis without CT evidence for acute cholecystitis. Electronically Signed   By: Lovey Newcomer M.D.   On: 07/11/2019 17:53   DG CHEST PORT 1 VIEW  Result Date: 07/13/2019 CLINICAL DATA:  Sepsis EXAM: PORTABLE CHEST 1 VIEW COMPARISON:  07/11/2019 FINDINGS: The cardiomediastinal silhouette is unremarkable. There is no evidence of focal airspace disease, pulmonary edema, suspicious pulmonary nodule/mass, pleural effusion, or pneumothorax. No acute bony abnormalities are identified. IMPRESSION: No active disease. Electronically Signed   By: Margarette Canada M.D.   On: 07/13/2019 10:37   DG Chest Portable 1 View  Result Date: 07/11/2019 CLINICAL DATA:  Fever EXAM: PORTABLE CHEST 1 VIEW COMPARISON:  08/17/2013 FINDINGS: Cardiac enlargement without heart failure. Lungs clear without infiltrate or effusion. Mediastinal contours normal. IMPRESSION: No active disease.  Electronically Signed   By: Franchot Gallo M.D.   On: 07/11/2019 19:46  Scheduled Meds: . acetaminophen  650 mg Oral Q6H   Or  . acetaminophen  650 mg Rectal Q6H  . enoxaparin (LOVENOX) injection  40 mg Subcutaneous Q24H  . pravastatin  20 mg Oral Daily   Continuous Infusions: . sodium chloride 75 mL/hr at 07/12/19 2358  .  ceFAZolin (ANCEF) IV 2 g (07/13/19 1116)    Principal Problem:   Staphylococcus aureus bacteremia Active Problems:   Hypertension   Mitral regurgitation due to partially flail posterior mitral valve leaflet   History of SBE (subacute bacterial endocarditis)   SIRS (systemic inflammatory response syndrome) ([place])  Time spent:   Irwin Brakeman, MD [facility] 07/13/2019, 2:03 PM    LOS: 2 days  How to contact the [facility] Attending or Consulting provider [place] or covering provider during after hours [place], for this patient?  1. Check the care team in [facility] and look for a) attending/consulting TRH provider listed and b) the [facility] team listed 2. Log into [email or web address] and use [place]'s universal password to access. If you do not have the password, please contact the hospital operator. 3. Locate the [facility] provider you are looking for under [facility] and page to a number that you can be directly reached. 4. If you still have difficulty reaching the provider, please page the [facility] (Director on Call) for the Hospitalists listed on amion for assistance.

## 2019-07-13 NOTE — Progress Notes (Signed)
Patient concerned about insurance covering echo since had one recently(03/2019). Spoke with Dr.Johnson who spoke with patient. Per Dr.Johnson it is okay to wait until Monday for TEE.

## 2019-07-13 NOTE — Progress Notes (Signed)
74 yo M with hx of MV disease, prev IE, adm with fever and tachycardia. He was started on vanco/cefepime/flagyl. Found to have MSSA bacteremia. He is now on ancef Repeat BCx to be done tomorrow TEE to be done on 07-15-19     [facility] Antimicrobial Management Team Staphylococcus aureus bacteremia   Staphylococcus aureus bacteremia (SAB) is associated with a high rate of complications and mortality.  Specific aspects of clinical management are critical to optimizing the outcome of patients with SAB.  Therefore, the [facility] Antimicrobial Management Team [facility]) has initiated an intervention aimed at improving the management of SAB at [facility].  To do so, Infectious Diseases physicians are providing an evidence-based consult for the management of all patients with SAB.     Yes No Comments  Perform follow-up blood cultures (even if the patient is afebrile) to ensure clearance of bacteremia [x]  []    Remove vascular catheter and obtain follow-up blood cultures after the removal of the catheter []  []    Perform echocardiography to evaluate for endocarditis (transthoracic ECHO is 40-50% sensitive, TEE is > 90% sensitive) [x]  []  Please keep in mind, that neither test can definitively EXCLUDE endocarditis, and that should clinical suspicion remain high for endocarditis the patient should then still be treated with an "endocarditis" duration of therapy = 6 weeks  Consult electrophysiologist to evaluate implanted cardiac device (pacemaker, ICD) []  []    Ensure source control []  []  Have all abscesses been drained effectively? Have deep seeded infections (septic joints or osteomyelitis) had appropriate surgical debridement?  Investigate for "metastatic" sites of infection []  []  Does the patient have ANY symptom or physical exam finding that would suggest a deeper infection (back or neck pain that may be suggestive of vertebral osteomyelitis or epidural abscess, muscle pain that could be a symptom of  pyomyositis)?  Keep in mind that for deep seeded infections MRI imaging with contrast is preferred rather than other often insensitive tests such as plain x-rays, especially early in a patient's presentation.  Change antibiotic therapy to __________________ [x]  []  Beta-lactam antibiotics are preferred for MSSA due to higher cure rates.   If on Vancomycin, goal trough should be 15 - 20 mcg/mL  Estimated duration of IV antibiotic therapy:   []  []  Consult case management for probably prolonged outpatient IV antibiotic therapy

## 2019-07-14 LAB — BASIC METABOLIC PANEL
Anion gap: 6 (ref 5–15)
BUN: 14 mg/dL (ref 8–23)
CO2: 24 mmol/L (ref 22–32)
Calcium: 8.4 mg/dL — ABNORMAL LOW (ref 8.9–10.3)
Chloride: 108 mmol/L (ref 98–111)
Creatinine, Ser: 1.03 mg/dL (ref 0.61–1.24)
GFR calc Af Amer: >60 mL/min (ref >60)
GFR calc non Af Amer: >60 mL/min (ref >60)
Glucose, Bld: 104 mg/dL — ABNORMAL HIGH (ref 70–99)
Potassium: 3.9 mmol/L (ref 3.5–5.1)
Sodium: 138 mmol/L (ref 135–145)

## 2019-07-14 LAB — CULTURE, BLOOD (ROUTINE X 2)
Special Requests: ADEQUATE
Special Requests: ADEQUATE

## 2019-07-14 MED ORDER — TRIAMCINOLONE ACETONIDE 0.5 % EX OINT
TOPICAL_OINTMENT | Freq: Three times a day (TID) | CUTANEOUS | Status: AC
  Administered 2019-07-14 – 2019-07-15 (×2): 1 via TOPICAL
  Filled 2019-07-14: qty 15
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
## 2019-07-14 NOTE — Progress Notes (Signed)
Repeat BCx sent this AM.  Await TEE.  On appropriate rx.  Will follow

## 2019-07-15 DIAGNOSIS — D539 Nutritional anemia, unspecified: Secondary | ICD-10-CM | POA: Diagnosis present

## 2019-07-15 LAB — BASIC METABOLIC PANEL
Anion gap: 7 (ref 5–15)
BUN: 14 mg/dL (ref 8–23)
CO2: 27 mmol/L (ref 22–32)
Calcium: 8.7 mg/dL — ABNORMAL LOW (ref 8.9–10.3)
Chloride: 108 mmol/L (ref 98–111)
Creatinine, Ser: 0.97 mg/dL (ref 0.61–1.24)
GFR calc Af Amer: >60 mL/min (ref >60)
GFR calc non Af Amer: >60 mL/min (ref >60)
Glucose, Bld: 101 mg/dL — ABNORMAL HIGH (ref 70–99)
Potassium: 4.2 mmol/L (ref 3.5–5.1)
Sodium: 142 mmol/L (ref 135–145)

## 2019-07-15 LAB — CBC
HCT: 34.7 % — ABNORMAL LOW (ref 39.0–52.0)
Hemoglobin: 11.1 g/dL — ABNORMAL LOW (ref 13.0–17.0)
MCH: 33.3 pg (ref 26.0–34.0)
MCHC: 32 g/dL (ref 30.0–36.0)
MCV: 104.2 fL — ABNORMAL HIGH (ref 80.0–100.0)
Platelets: 140 10*3/uL — ABNORMAL LOW (ref 150–400)
RBC: 3.33 MIL/uL — ABNORMAL LOW (ref 4.22–5.81)
RDW: 13.9 % (ref 11.5–15.5)
WBC: 6.8 10*3/uL (ref 4.0–10.5)
nRBC: 0 % (ref 0.0–0.2)

## 2019-07-15 LAB — MAGNESIUM: Magnesium: 1.8 mg/dL (ref 1.7–2.4)

## 2019-07-15 MED ORDER — SODIUM CHLORIDE 0.9 % IV SOLN: INTRAVENOUS | Status: DC

## 2019-07-15 NOTE — Progress Notes (Signed)
Pt is sitting up in bed watching television. He denies any pain at this time and only request is to have medicated ointment applied to areas where rash is present. No visible distress is noted. Call bell is within reach and pt remains NPO.

## 2019-07-15 NOTE — TOC Initial Note (Signed)
Transition of Care [facility]) - Initial/Assessment Note    Patient Details  Name: Tyler Diaz MRN: [id number] Date of Birth: 1945/08/15  [facility]) CM/SW Contact:    Josejulian Tarango, Chrystine Oiler, RN Phone Number: 07/15/2019, 12:39 PM  Clinical Narrative:    "Staph aureus bacteremia - IV antibiotics changed to cefazolin.  Repeat Blood Cultures 07/14/19 to document clearance of infection and plan on PICC line after repeat cultures negative x 24 to 48 hours."  TEE scheduled for 2/9.  PICC line 2/9 if repeat cultures are negative.  Discussed need for IV antibiotics. Patient wishes to do at home rather than SNF. He reports he has done at home before with the help of his son. Plans to do the same this time with the help of home health RN.   Used [facility] previously, will use again.   Expected Discharge Plan: Home w Home Health Services Barriers to Discharge: Continued Medical Work up   Patient Goals and CMS Choice Patient states their goals for this hospitalization and ongoing recovery are:: return home, wants to do IV antibiotics at home versus rehab CMS Medicare.gov Compare Post Acute Care list provided to:: Patient Choice offered to / list presented to : Patient  Expected Discharge Plan and Services Expected Discharge Plan: Home w Home Health Services   Discharge Planning Services: CM Consult, Medication Assistance Post Acute Care Choice: Home Health Living arrangements for the past 2 months: Single Family Home                             Prior Living Arrangements/Services Living arrangements for the past 2 months: Single Family Home Lives with:: Adult Children                   Activities of Daily Living Home Assistive Devices/Equipment: None ADL Screening (condition at time of admission) Patient's cognitive ability adequate to safely complete daily activities?: Yes Is the patient deaf or have difficulty hearing?: Yes Does the patient have difficulty  seeing, even when wearing glasses/contacts?: No Does the patient have difficulty concentrating, remembering, or making decisions?: No Patient able to express need for assistance with ADLs?: Yes Does the patient have difficulty dressing or bathing?: No Independently performs ADLs?: Yes (appropriate for developmental age) Does the patient have difficulty walking or climbing stairs?: Yes Weakness of Legs: None Weakness of Arms/Hands: None  Permission Sought/Granted                  Emotional Assessment   Attitude/Demeanor/Rapport: Engaged Affect (typically observed): Accepting, Appropriate Orientation: : Oriented to Self, Oriented to Place, Oriented to  Time, Oriented to Situation Alcohol / Substance Use: Not Applicable Psych Involvement: No (comment)  Admission diagnosis:  SIRS (systemic inflammatory response syndrome) (HCC) [R65.10] Patient Active Problem List   Diagnosis Date Noted  . Staphylococcus aureus bacteremia 07/12/2019  . SIRS (systemic inflammatory response syndrome) (HCC) 07/11/2019  . BPH (benign prostatic hyperplasia) 10/22/2015  . Recurrent left inguinal hernia s/p lap repair w mesh 08/13/2015 08/13/2015  . Right inguinal hernia s/p lap repair w mesh 08/13/2015  . History of SBE (subacute bacterial endocarditis) 07/06/2013  . Mitral regurgitation due to partially flail posterior mitral valve leaflet 07/30/2012  . Hyperlipidemia   . Rectal cancer - distal s/p lap LAR/coloanal July 2014 - [id number]. 07/27/2012  . Hypertension    PCP:  Raliegh Ip, DO Pharmacy:   [facility] [address], [place] -  [address] [place] [address] Phone: [phone] Fax: [phone]  [facility], [place] - [address] [place] [address] Phone: [phone] Fax: [phone]     Social Determinants of Health (SDOH) Interventions    Readmission Risk Interventions No flowsheet data  found.

## 2019-07-15 NOTE — Progress Notes (Signed)
   Progress Note  Patient Name: Tyler Diaz Date of Encounter: 07/15/2019  Primary Cardiologist: Carlyle Dolly, MD  Request per Dr. Wynetta Emery to schedule TEE in the setting of Staph aureus bacteremia and mitral valve regurgitation.  Unable to accommodate procedure today due to current hospital and office cardiology volume and inability to arrange an acceptable time this morning.  Procedure has been scheduled for tomorrow morning at 10:30 with Dr. Harl Bowie, discussed with patient per Ms. Bonnell Public PA-C and orders written.  Signed, Rozann Lesches, MD  07/15/2019, 10:44 AM

## 2019-07-15 NOTE — Care Management Important Message (Signed)
Important Message  Patient Details  Name: Tyler Diaz MRN: [id number] Date of Birth: 1945-08-22   Medicare Important Message Given:  Yes     Tommy Medal 07/15/2019, 3:47 PM
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
## 2019-07-15 NOTE — Progress Notes (Signed)
RN has been assigned to a different pod and report has been given to [employer], Therapist, sports. Pt stable and visiting with son in room. He shows no visible signs of distress.

## 2019-07-15 NOTE — Progress Notes (Signed)
Patient ID: Tyler Diaz, male   DOB: Apr 28, 1946, 74 y.o.   MRN: [id number]          [facility]    Date of Admission:  07/11/2019    Total days of antibiotics 5        Day 4 cefazolin  Tyler Diaz has MSSA bacteremia, possibly arising from the recent dermatitis around his neck.  He has known mitral valve disease and history of enterococcal mitral valve endocarditis in 2015.  He became afebrile fairly promptly with antibiotic therapy.  Repeat blood cultures are negative at a little over 24 hours.  If they remain negative tomorrow he can safely have a PICC placed.  TEE is scheduled for tomorrow to help determine optimal duration of IV cefazolin therapy.         Michel Bickers, MD [facility] [place] [facility] [phone] pager   [phone] cell 07/15/2019, 2:02 PM

## 2019-07-15 NOTE — Anesthesia Preprocedure Evaluation (Addendum)
Anesthesia Evaluation  Patient identified by MRN, date of birth, ID band Patient awake    Reviewed: Allergy & Precautions, NPO status , Patient's Chart, lab work & pertinent test results  Airway Mallampati: II  TM Distance: >3 FB Neck ROM: Full    Dental  (+) Dental Advisory Given, Implants   Pulmonary former smoker,    Pulmonary exam normal breath sounds clear to auscultation       Cardiovascular Exercise Tolerance: Good hypertension, Pt. on medications + Peripheral Vascular Disease  + Valvular Problems/Murmurs MR  Rhythm:Irregular Rate:Normal + Systolic murmurs 1. Left ventricular ejection fraction, by visual estimation, is 55 to  60%. The left ventricle has normal function. Normal left ventricular size.  There is mildly increased left ventricular hypertrophy.  2. Global right ventricle has normal systolic function.The right  ventricular size is normal. No increase in right ventricular wall  thickness.  3. Left atrial size was mildly dilated.  4. Right atrial size was normal.  5. Mild aortic valve annular calcification.  6. The mitral valve is abnormal. There is posterior leaflet prolapse and  a calcified density on the atrial side of the posterior leaflet that could  represent a partial flail subsegment of the posterior leaflet or perhaps  an old vegetation. There is  eccentric, anteriorly directed, moderate to severe mitral valve  regurgitation. PISA calculations are challenging with very eccentric jet,  but one image suggests MR radius is at least 1 cm which is consistent with  severe regurgitation. Suggest TEE for  further evaluation if clinically indicated.  7. The tricuspid valve is grossly normal. Tricuspid valve regurgitation  is mild.  8. The aortic valve is tricuspid Aortic valve regurgitation is trivial by  color flow Doppler.  9. The pulmonic valve was grossly normal. Pulmonic valve regurgitation is   mild by color flow Doppler.  10. Normal pulmonary artery systolic pressure.  11. The tricuspid regurgitant velocity is 2.36 m/s, and with an assumed  right atrial pressure of 3 mmHg, the estimated right ventricular systolic  pressure is normal at 25.3 mmHg.  12. The inferior vena cava is normal in size with greater than 50%  respiratory variability, suggesting right atrial pressure of 3 mmHg.   In comparison to the previous echocardiogram(s): Previous Echo at [facility]  showed LV EF 60%, mild LAE, moderate to severe MR with a wall-impinging MR  jet and a flail cusp of the posterior leaflet. Mild MR, PI and dilated  IVC. These images were not able  to be viewed directly.    Neuro/Psych    GI/Hepatic Neg liver ROS, GERD  ,Rectal cancer   Endo/Other  Hypothyroidism   Renal/GU negative Renal ROS     Musculoskeletal  (+) Arthritis ,   Abdominal   Peds  Hematology  (+) anemia ,   Anesthesia Other Findings Tyler Diaz has MSSA bacteremia, possibly arising from the recent dermatitis around his neck.  He has known mitral valve disease and history of enterococcal mitral valve endocarditis in 2015.  He became afebrile fairly promptly with antibiotic therapy.  Repeat blood cultures are negative at a little over 24 hours.  If they remain negative tomorrow he can safely have a PICC placed.  TEE is scheduled for tomorrow to help determine optimal duration of IV cefazolin therapy.  Tyler Bickers, MD [place] for Infectious Disease [facility] [phone] pager   [phone] cell 07/15/2019, 2:02 PM  Reproductive/Obstetrics                           Anesthesia Physical Anesthesia Plan  ASA: III  Anesthesia Plan: General   Post-op Pain Management:    Induction: Intravenous  PONV Risk Score and Plan: TIVA  Airway Management Planned: Nasal Cannula, Natural  Airway and Simple Face Mask  Additional Equipment:   Intra-op Plan:   Post-operative Plan:   Informed Consent: I have reviewed the patients History and Physical, chart, labs and discussed the procedure including the risks, benefits and alternatives for the proposed anesthesia with the patient or authorized representative who has indicated his/her understanding and acceptance.     Dental advisory given  Plan Discussed with: CRNA  Anesthesia Plan Comments:       Anesthesia Quick Evaluation

## 2019-07-16 ENCOUNTER — Inpatient Hospital Stay: Payer: Self-pay

## 2019-07-16 ENCOUNTER — Inpatient Hospital Stay (HOSPITAL_COMMUNITY): Payer: PPO | Admitting: Anesthesiology

## 2019-07-16 ENCOUNTER — Encounter (HOSPITAL_COMMUNITY)
Admission: EM | Disposition: A | Discharge status: Home Health | Payer: Self-pay | Source: Ambulatory Visit | Attending: Family Medicine

## 2019-07-16 ENCOUNTER — Inpatient Hospital Stay (HOSPITAL_COMMUNITY): Payer: PPO

## 2019-07-16 ENCOUNTER — Encounter (HOSPITAL_COMMUNITY): Payer: Self-pay | Admitting: Internal Medicine

## 2019-07-16 DIAGNOSIS — R7881 Bacteremia: Secondary | ICD-10-CM | POA: Diagnosis not present

## 2019-07-16 DIAGNOSIS — I340 Nonrheumatic mitral (valve) insufficiency: Secondary | ICD-10-CM

## 2019-07-16 DIAGNOSIS — E039 Hypothyroidism, unspecified: Secondary | ICD-10-CM | POA: Diagnosis not present

## 2019-07-16 DIAGNOSIS — I361 Nonrheumatic tricuspid (valve) insufficiency: Secondary | ICD-10-CM

## 2019-07-16 DIAGNOSIS — I10 Essential (primary) hypertension: Secondary | ICD-10-CM | POA: Diagnosis not present

## 2019-07-16 HISTORY — PX: TEE WITHOUT CARDIOVERSION: SHX5443

## 2019-07-16 LAB — SURGICAL PCR SCREEN
MRSA, PCR: NEGATIVE
Staphylococcus aureus: NEGATIVE

## 2019-07-16 SURGERY — ECHOCARDIOGRAM, TRANSESOPHAGEAL
Anesthesia: General

## 2019-07-16 MED ORDER — ACETAMINOPHEN 325 MG PO TABS
650.0000 mg | ORAL_TABLET | Freq: Once | ORAL | Status: AC
  Administered 2019-07-16: 650 mg via ORAL
  Filled 2019-07-16: qty 2

## 2019-07-16 MED ORDER — PROPOFOL 10 MG/ML IV BOLUS
INTRAVENOUS | Status: AC
  Filled 2019-07-16: qty 40

## 2019-07-16 MED ORDER — SUCCINYLCHOLINE CHLORIDE 200 MG/10ML IV SOSY
PREFILLED_SYRINGE | INTRAVENOUS | Status: AC
  Filled 2019-07-16: qty 10

## 2019-07-16 MED ORDER — KETAMINE HCL 50 MG/5ML IJ SOSY
PREFILLED_SYRINGE | INTRAMUSCULAR | Status: AC
  Filled 2019-07-16: qty 5

## 2019-07-16 MED ORDER — PROPOFOL 500 MG/50ML IV EMUL
INTRAVENOUS | Status: DC | PRN
  Administered 2019-07-16: 100 ug/kg/min via INTRAVENOUS

## 2019-07-16 MED ORDER — LACTATED RINGERS IV SOLN: INTRAVENOUS | Status: DC | PRN

## 2019-07-16 MED ORDER — LIDOCAINE 2% (20 MG/ML) 5 ML SYRINGE
INTRAMUSCULAR | Status: AC
  Filled 2019-07-16: qty 5

## 2019-07-16 MED ORDER — GLYCOPYRROLATE PF 0.2 MG/ML IJ SOSY
PREFILLED_SYRINGE | INTRAMUSCULAR | Status: AC
  Filled 2019-07-16: qty 1

## 2019-07-16 NOTE — Progress Notes (Signed)
*  PRELIMINARY RESULTS* Echocardiogram Echocardiogram Transesophageal has been performed.  Tyler Diaz 07/16/2019, 12:03 PM

## 2019-07-16 NOTE — Progress Notes (Signed)
Update floor RN  Plan to place PICC in am.

## 2019-07-16 NOTE — Progress Notes (Signed)
PHARMACY CONSULT NOTE FOR:  OUTPATIENT  PARENTERAL ANTIBIOTIC THERAPY (OPAT)  Indication: MSSA bacteremia Regimen: Cefazolin 2000 mg IV every 8 hours End date: 08/01/19  IV antibiotic discharge orders are pended. To discharging provider:  please sign these orders via discharge navigator,  Select New Orders & click on the button choice - Manage This Unsigned Work.     Thank you for allowing pharmacy to be a part of this patient's care.  Ramond Craver 07/16/2019, 2:15 PM

## 2019-07-16 NOTE — TOC Progression Note (Signed)
Transition of Care [facility]) - Progression Note    Patient Details  Name: Tyler Diaz MRN: [id number] Date of Birth: 23-Dec-1945  Transition of Care [facility]) CM/SW Contact  Shade Flood, LCSW Phone Number: 07/16/2019, 2:14 PM  Clinical Narrative:     TOC following. Pt status discussed with MD in Progression today. Anticipating pt will get PICC today after TEE and will likely be able to dc home tomorrow. Updated Carolynn Sayers from [facility]. Pam meeting pt and son today at 4:30 to do teaching. She has arranged for [facility] to follow for RN needs at home.   Assigned TOC will follow up tomorrow.  Expected Discharge Plan: [place] Barriers to Discharge: Continued Medical Work up  Expected Discharge Plan and Services Expected Discharge Plan: [place]   Discharge Planning Services: CM Consult, Medication Assistance Post Acute Care Choice: [place] arrangements for the past 2 months: Single Family Home                                       Social Determinants of Health (SDOH) Interventions    Readmission Risk Interventions No flowsheet data found.
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
## 2019-07-16 NOTE — H&P (Signed)
Patient admitted with staph bacteremia, cardiology consulted for TEE to evaluate for cardiac involvement. WIll plan for TEE today. Please refer to referenced admission H&P below for full history.   Dina Rich MD    History and Physical  JADORE PAOLUCCI RUE:[id number] DOB: [birth year] DOA: 07/11/2019  PCP: Raliegh Ip, DO  Patient coming from: Home  I have personally briefly reviewed patient's old medical records in [facility]  Chief Complaint: Abdominal pain, fever  HPI: ARTHER DWINELL is a 74 y.o. male with medical history significant for hypertension, colon cancer, subacute bacterial endocarditis, mitral regurgitation.  Patient presented to the ED with complaints of abdominal pain, mostly left-sided, that has mostly resolved at this time. One episode of loose stool last night, and fever to 102 at home. No vomiting. No pain with urination. He has some mild chronic neck pain that is unchanged, no neck stiffness. He denies cough, no difficulty breathing. No known Covid positive contacts.  ED Course: T-max 102.7, tachycardic to 127, fluctuating O2 saturations, > 94 % on room air. WBC 14.3. Normal lactic acid of 1.3. POC COVID-19 test negative. Chest x-ray negative for acute abnormality, CT abdomen pelvis with contrast unremarkable for acute pathology. UA with moderate hemoglobin otherwise no suggestive of UTI. Blood and urine cultures obtained. Antibiotics IV vancomycin cefepime and metronidazole started in ED. Hospitalist admit for further evaluation.  Review of Systems: As per HPI all other systems reviewed and negative.      Past Medical History:  Diagnosis Date  . Anemia    hx of  . Arthritis    hands/knees  . Blood clot in vein    RT ARM WITH PICC LINE   . C. difficile colitis 08/19/2013  . Colon cancer (HCC) 07/18/12 bx   rectum=Invasive adenocarcinoma w/ extracellular mucin,polyp=benign  . GERD (gastroesophageal reflux disease)    OCCASIONAL  . Hearing loss    partial  greater on left  . Heart murmur   . Hematochezia    recent  . Herpes zoster - R flank - with severe pain 03/11/2013  . History of shingles   . Hyperlipidemia   . Hypertension   . Hypothyroidism as child   hx of  . Ileostomy - diverting loop - s/p takedown 06/28/2013 01/14/2013  . Mitral regurgitation due to partially flail posterior mitral valve leaflet   . Nocturia   . Numbness    TOES / FINGERS DUE TO CHEMO  . Peripheral vascular disease (HCC)    neuropathy due to cancer treatment (fingers and toes)  . Radiation    FOR COLON CANCER  . Rectal cancer (HCC)   . Rheumatic fever as child   hx  . Sinusitis    seasonal  . Tinnitus    left ear  . Umbilical hernia, incarcerated - s/p primary repair July 2014 01/03/2013        Past Surgical History:  Procedure Laterality Date  . CARPAL TUNNEL RELEASE Right 2002  . ILEOSTOMY N/A 12/27/2012   Procedure: DIVERTING LOOP ILEOSTOMY; Surgeon: Ardeth Sportsman, MD; Location: [facility]; Service: General; Laterality: N/A;  . ILEOSTOMY CLOSURE N/A 06/28/2013   Procedure: loop ILEOSTOMY TAKEDOWN examination of anesthesia ; Surgeon: Ardeth Sportsman, MD; Location: [facility]; Service: General; Laterality: N/A;  . INGUINAL HERNIA REPAIR Left   . INGUINAL HERNIA REPAIR Bilateral 08/13/2015   Procedure: LAPAROSCOPIC BILATERAL INGUINAL HERNIA REPAIR WITH MESH; Surgeon: Karie Soda, MD; Location: [facility]; Service: General; Laterality: Bilateral;  . INSERTION OF MESH  Bilateral 08/13/2015   Procedure: INSERTION OF MESH; Surgeon: Karie Soda, MD; Location: [facility]; Service: General; Laterality: Bilateral;  . LAPAROSCOPIC LOW ANTERIOR RESCECTION WITH COLOANAL ANASTOMOSIS N/A 12/27/2012   Procedure: LAPAROSCOPIC LOW ANTERIOR RESCECTION WITH COLOANAL ANASTOMOSIS; Surgeon: Ardeth Sportsman, MD; Location: [facility]; Service: General; Laterality: N/A;  . LAPAROSCOPIC LOW ANTERIOR RESECTION N/A 12/27/2012   Procedure: LAPAROSCOPIC LOW ANTERIOR RESECTION, COLO-ANAL ANASTOMOSIS,  DIVERTING LOOP ILEOSTOMY,SPLENIC FLEXURE MOBILIZATION, PRIMARY INCARCERATED UMBILICAL HERNIA REPAIR; Surgeon: Ardeth Sportsman, MD; Location: [facility]; Service: General; Laterality: N/A;  . TEE WITHOUT CARDIOVERSION N/A 07/10/2013   Procedure: TRANSESOPHAGEAL ECHOCARDIOGRAM (TEE); Surgeon: Donato Schultz, MD; Location: [facility] ENDOSCOPY; Service: Cardiovascular; Laterality: N/A;  . TEE WITHOUT CARDIOVERSION N/A 08/21/2013   Procedure: TRANSESOPHAGEAL ECHOCARDIOGRAM (TEE); Surgeon: Thurmon Fair, MD; Location: [facility] ENDOSCOPY; Service: Cardiovascular; Laterality: N/A;  . TONSILLECTOMY     as child  . UMBILICAL HERNIA REPAIR  12/27/2012   Procedure: PRIMARY REPAIR INCARCERATED UMBILICAL HERNIA; Surgeon: Ardeth Sportsman, MD; Location: [facility]; Service: General;;   reports that he quit smoking about 46 years ago. His smoking use included cigarettes. He has a 30.00 pack-year smoking history. He has never used smokeless tobacco. He reports that he does not drink alcohol or use drugs.      Allergies  Allergen Reactions  . Shrimp [Shellfish Allergy] Hives, Nausea And Vomiting and Rash        Family History  Problem Relation Age of Onset  . Cancer Mother    abdominal ca ?ovarian?  . Heart disease Father   . Cancer Sister    liver cancer  . Heart disease Brother   . COPD Brother   . Healthy Son   . Healthy Son           Prior to Admission medications   Medication Sig Start Date End Date Taking? Authorizing Provider  acetaminophen (TYLENOL) 500 MG tablet Take 1,000 mg by mouth every 6 (six) hours as needed for headache.    Yes [provider]  diclofenac sodium (VOLTAREN) 1 % GEL Apply 4 g topically 4 (four) times daily. (for joint pain) 11/21/18  Yes Gottschalk, Ashly M, DO  hydrochlorothiazide (HYDRODIURIL) 25 MG tablet TAKE 1 TABLET BY MOUTH ONCE DAILY. STOPPING CHLORTHALIDONE. MED CHANGED 07/12/2018  Patient taking differently: Take 25 mg by mouth daily.  02/19/19  Yes Mahlik Lenn, Dorothe Pea, MD   hydrocortisone (PROCTO-MED HC) 2.5 % rectal cream Place 1 application rectally 2 (two) times daily. x7-10 days prn hemorrhoid (may need to use brand for ins coverage)  Patient taking differently: Place 1 application rectally 2 (two) times daily as needed for hemorrhoids or anal itching. x7-10 days prn hemorrhoid (may need to use brand for ins coverage) 05/24/19  Yes Gottschalk, Ashly M, DO  ketoconazole (NIZORAL) 2 % cream APPLY TOPICALLY TWICE DAILY  Patient taking differently: Apply 1 application topically 2 (two) times daily.  09/11/18  Yes Mechele Claude, MD  lisinopril (ZESTRIL) 40 MG tablet Take 1 tablet by mouth once daily  Patient taking differently: Take 40 mg by mouth daily.  06/28/19  Yes Delynn Flavin M, DO  Multiple Vitamin (MULTIVITAMIN WITH MINERALS) TABS Take 1 tablet by mouth every morning.    Yes [provider]  oxymetazoline (AFRIN) 0.05 % nasal spray Place 1 spray into both nostrils 2 (two) times daily as needed for congestion.   Yes [provider]  pravastatin (PRAVACHOL) 20 MG tablet Take 1 tablet (20 mg total) by mouth daily. 05/22/18  Yes Oswaldo Done,  Norwood Levo, MD  Probiotic Product (DIGESTIVE ADVANTAGE PO) Take 1 tablet by mouth daily.   Yes [provider]  psyllium (METAMUCIL) 58.6 % packet Take 1 packet by mouth daily.   Yes [provider]  triamcinolone cream (KENALOG) 0.1 % APPLY TOPICALLY THREE TIMES DAILY  Patient taking differently: Apply 1 application topically 3 (three) times daily.  01/18/19  Yes Raliegh Ip, DO  Physical Exam:        Vitals:   07/11/19 1900 07/11/19 1930 07/11/19 2000 07/11/19 2014  BP: 123/72  122/71   Pulse: (!) 122  (!) 127 (!) 126  Resp:      Temp:  (!) 102.7 F (39.3 C)    TempSrc:  Oral    SpO2: (!) 89%  94% 94%  Weight:      Height:       Constitutional: NAD, calm, comfortable        Vitals:   07/11/19 1900 07/11/19 1930 07/11/19 2000 07/11/19 2014  BP: 123/72  122/71   Pulse: (!)  122  (!) 127 (!) 126  Resp:      Temp:  (!) 102.7 F (39.3 C)    TempSrc:  Oral    SpO2: (!) 89%  94% 94%  Weight:      Height:       Eyes: PERRL, lids and conjunctivae normal  ENMT: Mucous membranes are moist. Posterior pharynx clear of any exudate or lesions  Neck: normal, supple, no masses  Respiratory: normal respiratory effort. No accessory muscle use.  Cardiovascular: Tachycardic, regular rate and rhythm, No extremity edema. 2+ pedal pulses.  Abdomen: no tenderness, no masses palpated. No hepatosplenomegaly. Bowel sounds positive.  Musculoskeletal: no clubbing / cyanosis. No joint deformity upper and lower extremities. Good ROM, no contractures.  Skin: no rashes, lesions, ulcers. No induration  Neurologic: No gross cranial nerve abnormality, moving all extremities spontaneously.  Psychiatric: Normal judgment and insight. Alert and oriented x 3. Normal mood.  Labs on Admission: I have personally reviewed following labs and imaging studies  CBC:  Last Labs                                      Basic Metabolic Panel:  Last Labs                                              Liver Function Tests:  Last Labs                                       Last Labs                  Urine analysis:  Labs (Brief)  Radiological Exams on Admission:  Imaging Results (Last 48 hours)    EKG: None.  Assessment/Plan  Active Problems:  SIRS (systemic inflammatory response syndrome) (HCC)   SIRS-with normal lactic acid. T-max 102.7, tachycardic to 127, stable blood pressure. Mild fluctuation O2 saturations. POC COVID-19 test negative. Portable chest x-ray without acute abnormality. CT abdomen and pelvis mild bladder wall thickening, correlation with UA recommended cholelithiasis without acute cholecystitis. UA not suggestive of UTI. Neck supple.  - F/u respiratory  panel  -Follow-up blood and urine cultures  -If positive blood cultures considering history of abnormal mitral valves, may need echocardiogram  -Continue broad-spectrum antibiotics IV vancomycin, cefepime and metronidazole  -CBC, BMP a.m.  - bolus given, continue N/s 100cc/hr x15hrs  Hypertension-stable.  -Hold HCTZ for now in the setting of SIRS and while hydrating  History of mitral regurgitation-follows with Dr. Argentina Donovan shows EF of 55 to 60%, moderate to severe mitral valve regurgitation, posterior leaflet prolapse and calcified density on atrial side of posterior leaflet-partial flail subsegment or old vegetation.  Questionable history of subacute bacterial endocarditis 08/2013  BPH-not on medication.  History of rectal cancer stage III, 2014, in remission. Status post radiation and chemotherapy, and resection. Undergoing surveillance.  DVT prophylaxis: Lovenox  Code Status: Full code  Family Communication: None at bedside  Disposition Plan: > 2 days, likely discharge to home, pending resolution of SIRS physiology and determination of etiology.  Consults called: none  Admission status: Inpatient, telemetry  I certify that at the point of admission it is my clinical judgment that the patient will require inpatient hospital care spanning beyond 2 midnights from the point of admission due to high intensity of service, high risk for further deterioration and high frequency of surveillance required. The following factors support the patient status of inpatient: SIRs requiring IV antibiotics pending blood culture results      Onnie Boer MD  [facility]  07/11/2019, 10:32 PM

## 2019-07-16 NOTE — CV Procedure (Signed)
CV procedure note  Procedure: Transesophageal echocardiogram Physician: Dr Carlyle Dolly MD Indication: bacteremia   Patient was brought to the procedure suite after appropriate consent was obtained. Sedation was achieved with the assistance of anesthesiology, please see there note for details. Bite block was placed, the patient was layed in the left lateral decubitus position. The TEE probe was intubated into the esophagus succesfully and several views were taken. Please see full TEE report for findings, in summary no signs of endocarditis. Cardiopulmonary monitoring was performed throughout the procedure. The patient tolerated the procedure well without complications.   Chronic moderate to severe eccentric MR with chronic ruptured chordae. No signs of endocarditis   Carlyle Dolly MD

## 2019-07-16 NOTE — Progress Notes (Signed)
Patient ID: Tyler Diaz, male   DOB: September 07, 1945, 74 y.o.   MRN: [id number]         [facility]    Date of Admission:  07/11/2019    Total days of antibiotics 6        Day 5 cefazolin  Tyler Diaz has MSSA bacteremia.  Repeat blood cultures are negative at a little over 48 hours.  It is safe to proceed with PICC placement now.  He underwent TEE today.  Although there was no evidence of endocarditis he does have severe, chronic mitral regurgitation related to his episode of mitral valve endocarditis in 2015.  This certainly puts him at high risk for endocarditis.  I recommend treating him with IV cefazolin for a minimum of 3 weeks through 07/31/2019.  I will arrange follow-up in my clinic and sign off now.  Diagnosis: Bacteremia  Culture Result: MSSA  Allergies  Allergen Reactions  . Shrimp [Shellfish Allergy] Hives, Nausea And Vomiting and Rash    OPAT Orders Discharge antibiotics: Cefazolin  Duration: 3 weeks End Date: 07/31/2019  [facility] Care Per Protocol:  Home health RN for IV administration and teaching; PICC line care and labs.    Labs weekly while on IV antibiotics: _x_ CBC with differential _x_ BMP __ CMP __ CRP __ ESR __ Vancomycin trough __ CK  _x_ Please pull PIC at completion of IV antibiotics __ Please leave PIC in place until doctor has seen patient or been notified  Fax weekly labs to [phone]  Clinic Follow Up Appt: 07/31/2019         Tyler Diaz, [facility] [place] [phone] pager   [phone] cell 07/16/2019, 1:55 PM

## 2019-07-16 NOTE — Progress Notes (Signed)
Received order for PICC.  Plan on placing this afternoon.

## 2019-07-16 NOTE — Transfer of Care (Signed)
Immediate Anesthesia Transfer of Care Note  Patient: Tyler Diaz  Procedure(s) Performed: TRANSESOPHAGEAL ECHOCARDIOGRAM (TEE) WITH PROPOFOL (N/A )  Patient Location: PACU  Anesthesia Type:General  Level of Consciousness: awake  Airway & Oxygen Therapy: Patient Spontanous Breathing  Post-op Assessment: Report given to RN  Post vital signs: Reviewed  Last Vitals:  Vitals Value Taken Time  BP    Temp    Pulse    Resp    SpO2      Last Pain:  Vitals:   07/16/19 0937  TempSrc:   PainSc: 0-No pain      Patients Stated Pain Goal: 3 ([id number])  Complications: No apparent anesthesia complications

## 2019-07-16 NOTE — Anesthesia Postprocedure Evaluation (Signed)
Anesthesia Post Note  Patient: MANINDER POPELKA  Procedure(s) Performed: TRANSESOPHAGEAL ECHOCARDIOGRAM (TEE) WITH PROPOFOL (N/A )  Patient location during evaluation: PACU Anesthesia Type: General Level of consciousness: awake and alert and awake Pain management: pain level controlled Vital Signs Assessment: post-procedure vital signs reviewed and stable Respiratory status: spontaneous breathing Cardiovascular status: blood pressure returned to baseline and stable Postop Assessment: no apparent nausea or vomiting Anesthetic complications: no Comments: Late entry     Last Vitals:  Vitals:   07/16/19 1210 07/16/19 1220  BP: (!) 144/85 (!) 142/80  Pulse: 82 80  Resp: 14 20  Temp:  36.6 C  SpO2: 96% 98%    Last Pain:  Vitals:   07/16/19 1220  TempSrc:   PainSc: 0-No pain                 Jaylaa Gallion

## 2019-07-17 ENCOUNTER — Telehealth: Payer: Self-pay | Admitting: *Deleted

## 2019-07-17 DIAGNOSIS — A4101 Sepsis due to Methicillin susceptible Staphylococcus aureus: Secondary | ICD-10-CM

## 2019-07-17 DIAGNOSIS — I10 Essential (primary) hypertension: Secondary | ICD-10-CM | POA: Diagnosis not present

## 2019-07-17 HISTORY — DX: Sepsis due to methicillin susceptible Staphylococcus aureus: A41.01

## 2019-07-17 MED ORDER — CEFAZOLIN IV (FOR PTA / DISCHARGE USE ONLY): 2.0000 g | Freq: Three times a day (TID) | INTRAVENOUS | 0 refills | Status: DC

## 2019-07-17 MED ORDER — SODIUM CHLORIDE 0.9% FLUSH: 10.0000 mL | Freq: Two times a day (BID) | INTRAVENOUS | Status: DC

## 2019-07-17 MED ORDER — CHLORHEXIDINE GLUCONATE CLOTH 2 % EX PADS: 6.0000 | MEDICATED_PAD | Freq: Every day | CUTANEOUS | Status: DC

## 2019-07-17 MED ORDER — SODIUM CHLORIDE 0.9% FLUSH: 10.0000 mL | INTRAVENOUS | Status: DC | PRN

## 2019-07-17 NOTE — Care Management Important Message (Signed)
Important Message  Patient Details  Name: Tyler Diaz MRN: [id number] Date of Birth: 25-Nov-1945   Medicare Important Message Given:  Yes     Tommy Medal 07/17/2019, 12:48 PM

## 2019-07-17 NOTE — Progress Notes (Signed)
Peripherally Inserted Central Catheter/Midline Placement  The IV Nurse has discussed with the patient and/or persons authorized to consent for the patient, the purpose of this procedure and the potential benefits and risks involved with this procedure.  The benefits include less needle sticks, lab draws from the catheter, and the patient may be discharged home with the catheter. Risks include, but not limited to, infection, bleeding, blood clot (thrombus formation), and puncture of an artery; nerve damage and irregular heartbeat and possibility to perform a PICC exchange if needed/ordered by physician.  Alternatives to this procedure were also discussed.  Bard Power PICC patient education guide, fact sheet on infection prevention and patient information card has been provided to patient /or left at bedside.    PICC/Midline Placement Documentation        Tyler Diaz 07/17/2019, 10:31 AM
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
## 2019-07-17 NOTE — TOC Transition Note (Addendum)
Transition of Care [facility]) - CM/SW Discharge Note   Patient Details  Name: Tyler Diaz MRN: [id number] Date of Birth: July 02, 1945  [facility] CM/SW Contact:  Tyler Diaz, Tyler Reading, RN Phone Number: 07/17/2019, 12:41 PM   Clinical Narrative:   Patient discharging home today. IV antibiotics arranged. Home health RN arranged with [facility]. Patient is concerned about copay. [facility] to [facility], [facility] can not accept patient. Discussed with Tyler Diaz , [facility] nurse, she had reached out and no other agencies can provide service.    ADDENDUM: Tyler Diaz can accept patient with no copay. Start of care services will be Friday. Tyler Diaz of [facility] to follow up with son and be a resource if help needed prior to Friday. Son, Tyler Diaz, demonstrated good understanding of antibiotic administration during teaching with Tyler Diaz.    Final next level of care: [name] Barriers to Discharge: Barriers Resolved   Patient Goals and CMS Choice Patient states their goals for this hospitalization and ongoing recovery are:: return home, wants to do IV antibiotics at home versus rehab CMS Medicare.gov Compare Post Acute Care list provided to:: Patient Choice offered to / list presented to : Patient  Discharge Plan and Services   Discharge Planning Services: CM Consult, Medication Assistance Post Acute Care Choice: Home Health                    HH Arranged: RN [name]: [name] Date [name]: 07/16/19   Representative spoke with at [place]: [name] ([place]) Interventions     Readmission Risk Interventions No flowsheet data found.
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
## 2019-07-18 ENCOUNTER — Telehealth: Payer: Self-pay | Admitting: *Deleted

## 2019-07-18 ENCOUNTER — Telehealth: Payer: Self-pay

## 2019-07-18 MED ORDER — TRIAMCINOLONE ACETONIDE 0.1 % EX OINT: 1.0000 "application " | TOPICAL_OINTMENT | Freq: Three times a day (TID) | CUTANEOUS | 0 refills | Status: DC

## 2019-07-18 NOTE — Telephone Encounter (Signed)
Yes, that is exactly what I put in my note.
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
## 2019-07-18 NOTE — Telephone Encounter (Signed)
Received call today from [place] regarding orders to weekly labs and iv duration. Per ED note patient is supposed to have CBC w/ diff and BMP. End date noted as 07/31/19. Will forward message to MD to advise if these orders are still correct.  [place], [place]: [place], [place]

## 2019-07-19 DIAGNOSIS — M170 Bilateral primary osteoarthritis of knee: Secondary | ICD-10-CM | POA: Diagnosis not present

## 2019-07-19 DIAGNOSIS — E039 Hypothyroidism, unspecified: Secondary | ICD-10-CM | POA: Diagnosis not present

## 2019-07-19 DIAGNOSIS — I501 Left ventricular failure: Secondary | ICD-10-CM | POA: Diagnosis not present

## 2019-07-19 DIAGNOSIS — I0981 Rheumatic heart failure: Secondary | ICD-10-CM | POA: Diagnosis not present

## 2019-07-19 DIAGNOSIS — M19042 Primary osteoarthritis, left hand: Secondary | ICD-10-CM | POA: Diagnosis not present

## 2019-07-19 DIAGNOSIS — R569 Unspecified convulsions: Secondary | ICD-10-CM | POA: Diagnosis not present

## 2019-07-19 DIAGNOSIS — M47815 Spondylosis without myelopathy or radiculopathy, thoracolumbar region: Secondary | ICD-10-CM | POA: Diagnosis not present

## 2019-07-19 DIAGNOSIS — D631 Anemia in chronic kidney disease: Secondary | ICD-10-CM | POA: Diagnosis not present

## 2019-07-19 DIAGNOSIS — N1831 Chronic kidney disease, stage 3a: Secondary | ICD-10-CM | POA: Diagnosis not present

## 2019-07-19 DIAGNOSIS — B9561 Methicillin susceptible Staphylococcus aureus infection as the cause of diseases classified elsewhere: Secondary | ICD-10-CM | POA: Diagnosis not present

## 2019-07-19 DIAGNOSIS — K219 Gastro-esophageal reflux disease without esophagitis: Secondary | ICD-10-CM | POA: Diagnosis not present

## 2019-07-19 DIAGNOSIS — J329 Chronic sinusitis, unspecified: Secondary | ICD-10-CM | POA: Diagnosis not present

## 2019-07-19 DIAGNOSIS — I130 Hypertensive heart and chronic kidney disease with heart failure and stage 1 through stage 4 chronic kidney disease, or unspecified chronic kidney disease: Secondary | ICD-10-CM | POA: Diagnosis not present

## 2019-07-19 DIAGNOSIS — Z452 Encounter for adjustment and management of vascular access device: Secondary | ICD-10-CM | POA: Diagnosis not present

## 2019-07-19 DIAGNOSIS — I700 Atherosclerosis of aorta: Secondary | ICD-10-CM | POA: Diagnosis not present

## 2019-07-19 DIAGNOSIS — L259 Unspecified contact dermatitis, unspecified cause: Secondary | ICD-10-CM | POA: Diagnosis not present

## 2019-07-19 DIAGNOSIS — I051 Rheumatic mitral insufficiency: Secondary | ICD-10-CM | POA: Diagnosis not present

## 2019-07-19 DIAGNOSIS — J9811 Atelectasis: Secondary | ICD-10-CM | POA: Diagnosis not present

## 2019-07-19 DIAGNOSIS — H9190 Unspecified hearing loss, unspecified ear: Secondary | ICD-10-CM | POA: Diagnosis not present

## 2019-07-19 DIAGNOSIS — M19041 Primary osteoarthritis, right hand: Secondary | ICD-10-CM | POA: Diagnosis not present

## 2019-07-19 DIAGNOSIS — H9312 Tinnitus, left ear: Secondary | ICD-10-CM | POA: Diagnosis not present

## 2019-07-19 DIAGNOSIS — I739 Peripheral vascular disease, unspecified: Secondary | ICD-10-CM | POA: Diagnosis not present

## 2019-07-19 DIAGNOSIS — K5900 Constipation, unspecified: Secondary | ICD-10-CM | POA: Diagnosis not present

## 2019-07-19 DIAGNOSIS — K8020 Calculus of gallbladder without cholecystitis without obstruction: Secondary | ICD-10-CM | POA: Diagnosis not present

## 2019-07-19 DIAGNOSIS — E785 Hyperlipidemia, unspecified: Secondary | ICD-10-CM | POA: Diagnosis not present

## 2019-07-19 LAB — CULTURE, BLOOD (ROUTINE X 2): Culture: NO GROWTH

## 2019-07-22 ENCOUNTER — Ambulatory Visit (INDEPENDENT_AMBULATORY_CARE_PROVIDER_SITE_OTHER): Payer: PPO

## 2019-07-22 ENCOUNTER — Encounter: Payer: Self-pay | Admitting: Family Medicine

## 2019-07-22 ENCOUNTER — Other Ambulatory Visit: Payer: Self-pay

## 2019-07-22 ENCOUNTER — Encounter: Payer: Self-pay | Admitting: Internal Medicine

## 2019-07-22 DIAGNOSIS — Z5181 Encounter for therapeutic drug level monitoring: Secondary | ICD-10-CM | POA: Diagnosis not present

## 2019-07-22 DIAGNOSIS — Z8679 Personal history of other diseases of the circulatory system: Secondary | ICD-10-CM | POA: Diagnosis not present

## 2019-07-22 DIAGNOSIS — A4101 Sepsis due to Methicillin susceptible Staphylococcus aureus: Secondary | ICD-10-CM

## 2019-07-22 DIAGNOSIS — Z452 Encounter for adjustment and management of vascular access device: Secondary | ICD-10-CM

## 2019-07-22 DIAGNOSIS — I10 Essential (primary) hypertension: Secondary | ICD-10-CM

## 2019-07-22 DIAGNOSIS — I051 Rheumatic mitral insufficiency: Secondary | ICD-10-CM | POA: Diagnosis not present

## 2019-07-22 DIAGNOSIS — A412 Sepsis due to unspecified staphylococcus: Secondary | ICD-10-CM | POA: Diagnosis not present

## 2019-07-22 DIAGNOSIS — Z85038 Personal history of other malignant neoplasm of large intestine: Secondary | ICD-10-CM | POA: Diagnosis not present

## 2019-07-24 ENCOUNTER — Ambulatory Visit: Payer: PPO | Admitting: Family Medicine

## 2019-07-24 ENCOUNTER — Other Ambulatory Visit: Payer: Self-pay

## 2019-07-24 ENCOUNTER — Encounter: Payer: Self-pay | Admitting: Family Medicine

## 2019-07-24 ENCOUNTER — Ambulatory Visit (INDEPENDENT_AMBULATORY_CARE_PROVIDER_SITE_OTHER): Payer: PPO | Admitting: Family Medicine

## 2019-07-24 VITALS — BP 153/87 | HR 85 | Temp 97.8°F | Ht 70.0 in | Wt 186.2 lb

## 2019-07-24 DIAGNOSIS — B9561 Methicillin susceptible Staphylococcus aureus infection as the cause of diseases classified elsewhere: Secondary | ICD-10-CM | POA: Diagnosis not present

## 2019-07-24 DIAGNOSIS — A4101 Sepsis due to Methicillin susceptible Staphylococcus aureus: Secondary | ICD-10-CM | POA: Diagnosis not present

## 2019-07-24 DIAGNOSIS — L2489 Irritant contact dermatitis due to other agents: Secondary | ICD-10-CM

## 2019-07-24 DIAGNOSIS — R7881 Bacteremia: Secondary | ICD-10-CM | POA: Diagnosis not present

## 2019-07-24 DIAGNOSIS — I38 Endocarditis, valve unspecified: Secondary | ICD-10-CM | POA: Diagnosis not present

## 2019-07-24 DIAGNOSIS — F419 Anxiety disorder, unspecified: Secondary | ICD-10-CM | POA: Diagnosis not present

## 2019-07-24 MED ORDER — SERTRALINE HCL 25 MG PO TABS: 25.0000 mg | ORAL_TABLET | Freq: Every day | ORAL | 2 refills | Status: DC

## 2019-07-24 NOTE — Progress Notes (Signed)
Assessment & Plan:  1. Staphylococcus aureus sepsis [facility]) - Patient to continue antibiotics via PICC through 07/31/2019. Encouraged to keep follow-up appointment with ID on 07/31/2019 and cardiology on 08/12/2019. Labs have been drawn but not yet resulted. Home health RN to continue coming out weekly.   2. Irritant contact dermatitis due to other agents - Continue use of Triamcinolone cream as patient is seeing an improvement.   3. Anxiety - Starting patient on medication for the first time.  - sertraline (ZOLOFT) 25 MG tablet; Take 1 tablet (25 mg total) by mouth daily.  Dispense: 30 tablet; Refill: 2   Follow up plan: Return in about 6 weeks (around 09/04/2019) for anxiety.  Hendricks Limes, MSN, APRN, FNP-C [facility]  Subjective:   Patient ID: Tyler Diaz, male    DOB: 30-Jan-1946, 74 y.o.   MRN: [id number]  HPI: LADARRIUS Diaz is a 74 y.o. male presenting on 07/24/2019 for Hospitalization Follow-up (TOC, patient was in [place]) and Anxiety  Patient was admitted to [facility] from 07/11/2019 - 07/17/2019 due to sepsis likely secondary to presumed endocarditis/staph aureus bacteremia. This has resolved. Patient had a PICC line placed 07/17/2019 and was discharged with IV cefazolin that will continue through 07/31/2019. His son is administering his antibiotics through the PICC line. He does have a home health RN that was most recently at the house on Monday at which time she changed the dressing at the PICC line and drew labs (CBC, BMP). Patient has an appointment scheduled with ID (Dr. Megan Salon) on 07/31/2019 and with cardiology (Dr. Harl Bowie) on 08/12/2019.   Additionally patient is concerned about increasing anxiety. He reports he is the most nervous person ever born and feels medication could be good for him. He has never been on medication for anxiety in the past that he is aware of, although he does mention that it is possible he was on something when he had  cancer as they had him on numerous medications that he didn't know what they were for.   Depression screen [facility] 2/9 07/24/2019 05/24/2019 11/21/2018  Decreased Interest 2 0 0  Down, Depressed, Hopeless 2 1 0  PHQ - 2 Score 4 1 0  Altered sleeping 2 0 0  Tired, decreased energy 1 1 0  Change in appetite 0 0 0  Feeling bad or failure about yourself  2 0 0  Trouble concentrating 2 0 0  Moving slowly or fidgety/restless 0 0 0  Suicidal thoughts 0 0 0  PHQ-9 Score 11 2 0  Difficult doing work/chores Somewhat difficult Not difficult at all -   GAD 7 : Generalized Anxiety Score 07/24/2019 10/22/2015  Nervous, Anxious, on Edge 3 1  Control/stop worrying 3 1  Worry too much - different things 3 1  Trouble relaxing 2 1  Restless 2 0  Easily annoyed or irritable 2 1  Afraid - awful might happen 1 0  Total GAD 7 Score 16 5  Anxiety Difficulty - Not difficult at all    ROS: Negative unless specifically indicated above in HPI.   Relevant past medical history reviewed and updated as indicated.   Allergies and medications reviewed and updated.   Current Outpatient Medications:  .  acetaminophen (TYLENOL) 500 MG tablet, Take 1,000 mg by mouth every 6 (six) hours as needed for headache. , Disp: , Rfl:  .  ceFAZolin (ANCEF) IVPB, Inject 2 g into the vein every 8 (eight) hours for 15 days. Indication:  MSSA bacteremia Last Day of Therapy:  07/31/19 Labs - Once weekly:  CBC/D and BMP, Labs - Every other week:  ESR and CRP, Disp: 45 Units, Rfl: 0 .  diclofenac sodium (VOLTAREN) 1 % GEL, Apply 4 g topically 4 (four) times daily. (for joint pain), Disp: 400 g, Rfl: 2 .  hydrochlorothiazide (HYDRODIURIL) 25 MG tablet, TAKE 1 TABLET BY MOUTH ONCE DAILY. STOPPING CHLORTHALIDONE. MED CHANGED 07/12/2018 (Patient taking differently: Take 25 mg by mouth daily. ), Disp: 90 tablet, Rfl: 1 .  hydrocortisone (PROCTO-MED HC) 2.5 % rectal cream, Place 1 application rectally 2 (two) times daily. x7-10 days prn hemorrhoid  (may need to use brand for ins coverage) (Patient taking differently: Place 1 application rectally 2 (two) times daily as needed for hemorrhoids or anal itching. x7-10 days prn hemorrhoid (may need to use brand for ins coverage)), Disp: 30 g, Rfl: 0 .  ketoconazole (NIZORAL) 2 % cream, APPLY TOPICALLY TWICE DAILY (Patient taking differently: Apply 1 application topically 2 (two) times daily. ), Disp: 60 g, Rfl: 0 .  lisinopril (ZESTRIL) 40 MG tablet, Take 1 tablet by mouth once daily (Patient taking differently: Take 40 mg by mouth daily. ), Disp: 90 tablet, Rfl: 0 .  Multiple Vitamin (MULTIVITAMIN WITH MINERALS) TABS, Take 1 tablet by mouth every morning. , Disp: , Rfl:  .  oxymetazoline (AFRIN) 0.05 % nasal spray, Place 1 spray into both nostrils 2 (two) times daily as needed for congestion., Disp: , Rfl:  .  pravastatin (PRAVACHOL) 20 MG tablet, Take 1 tablet (20 mg total) by mouth daily., Disp: 90 tablet, Rfl: 3 .  Probiotic Product (DIGESTIVE ADVANTAGE PO), Take 1 tablet by mouth daily., Disp: , Rfl:  .  psyllium (METAMUCIL) 58.6 % packet, Take 1 packet by mouth daily., Disp: , Rfl:  .  triamcinolone ointment (KENALOG) 0.1 %, Apply 1 application topically 3 (three) times daily., Disp: 30 g, Rfl: 0 .  sertraline (ZOLOFT) 25 MG tablet, Take 1 tablet (25 mg total) by mouth daily., Disp: 30 tablet, Rfl: 2  Allergies  Allergen Reactions  . Shrimp [Shellfish Allergy] Hives, Nausea And Vomiting and Rash    Objective:   BP (!) 153/87   Pulse 85   Temp 97.8 F (36.6 C) (Temporal)   Ht 5' 10" (1.778 m)   Wt 186 lb 3.2 oz (84.5 kg)   SpO2 98%   BMI 26.72 kg/m    Physical Exam Vitals reviewed.  Constitutional:      General: He is not in acute distress.    Appearance: Normal appearance. He is overweight. He is not ill-appearing, toxic-appearing or diaphoretic.  HENT:     Head: Normocephalic and atraumatic.  Eyes:     General: No scleral icterus.       Right eye: No discharge.         Left eye: No discharge.     Conjunctiva/sclera: Conjunctivae normal.  Cardiovascular:     Rate and Rhythm: Normal rate and regular rhythm.     Heart sounds: Murmur present. Systolic murmur present with a grade of 3/6. No friction rub. No gallop.   Pulmonary:     Effort: Pulmonary effort is normal. No respiratory distress.     Breath sounds: Normal breath sounds. No stridor. No wheezing, rhonchi or rales.  Musculoskeletal:        General: Normal range of motion.     Cervical back: Normal range of motion.  Skin:    General: Skin is warm and   dry.     Findings: Rash (around neck - more on left side than right) present.     Comments: PICC line in place to left upper arm without erythema, warmth, or drainage. Last dressing change 07/22/2019.   Neurological:     Mental Status: He is alert and oriented to person, place, and time. Mental status is at baseline.  Psychiatric:        Mood and Affect: Mood normal.        Behavior: Behavior normal.        Thought Content: Thought content normal.        Judgment: Judgment normal.

## 2019-07-29 ENCOUNTER — Other Ambulatory Visit: Payer: Self-pay | Admitting: Family Medicine

## 2019-07-29 DIAGNOSIS — M19041 Primary osteoarthritis, right hand: Secondary | ICD-10-CM | POA: Diagnosis not present

## 2019-07-29 DIAGNOSIS — K8020 Calculus of gallbladder without cholecystitis without obstruction: Secondary | ICD-10-CM | POA: Diagnosis not present

## 2019-07-29 DIAGNOSIS — I501 Left ventricular failure: Secondary | ICD-10-CM | POA: Diagnosis not present

## 2019-07-29 DIAGNOSIS — Z452 Encounter for adjustment and management of vascular access device: Secondary | ICD-10-CM | POA: Diagnosis not present

## 2019-07-29 DIAGNOSIS — M170 Bilateral primary osteoarthritis of knee: Secondary | ICD-10-CM | POA: Diagnosis not present

## 2019-07-29 DIAGNOSIS — I130 Hypertensive heart and chronic kidney disease with heart failure and stage 1 through stage 4 chronic kidney disease, or unspecified chronic kidney disease: Secondary | ICD-10-CM | POA: Diagnosis not present

## 2019-07-29 DIAGNOSIS — J9811 Atelectasis: Secondary | ICD-10-CM | POA: Diagnosis not present

## 2019-07-29 DIAGNOSIS — D631 Anemia in chronic kidney disease: Secondary | ICD-10-CM | POA: Diagnosis not present

## 2019-07-29 DIAGNOSIS — H9312 Tinnitus, left ear: Secondary | ICD-10-CM | POA: Diagnosis not present

## 2019-07-29 DIAGNOSIS — I700 Atherosclerosis of aorta: Secondary | ICD-10-CM | POA: Diagnosis not present

## 2019-07-29 DIAGNOSIS — J329 Chronic sinusitis, unspecified: Secondary | ICD-10-CM | POA: Diagnosis not present

## 2019-07-29 DIAGNOSIS — K5900 Constipation, unspecified: Secondary | ICD-10-CM | POA: Diagnosis not present

## 2019-07-29 DIAGNOSIS — A4101 Sepsis due to Methicillin susceptible Staphylococcus aureus: Secondary | ICD-10-CM | POA: Diagnosis not present

## 2019-07-29 DIAGNOSIS — L259 Unspecified contact dermatitis, unspecified cause: Secondary | ICD-10-CM | POA: Diagnosis not present

## 2019-07-29 DIAGNOSIS — K219 Gastro-esophageal reflux disease without esophagitis: Secondary | ICD-10-CM | POA: Diagnosis not present

## 2019-07-29 DIAGNOSIS — E039 Hypothyroidism, unspecified: Secondary | ICD-10-CM | POA: Diagnosis not present

## 2019-07-29 DIAGNOSIS — N1831 Chronic kidney disease, stage 3a: Secondary | ICD-10-CM | POA: Diagnosis not present

## 2019-07-29 DIAGNOSIS — I0981 Rheumatic heart failure: Secondary | ICD-10-CM | POA: Diagnosis not present

## 2019-07-29 DIAGNOSIS — A412 Sepsis due to unspecified staphylococcus: Secondary | ICD-10-CM | POA: Diagnosis not present

## 2019-07-29 DIAGNOSIS — E785 Hyperlipidemia, unspecified: Secondary | ICD-10-CM | POA: Diagnosis not present

## 2019-07-29 DIAGNOSIS — R569 Unspecified convulsions: Secondary | ICD-10-CM | POA: Diagnosis not present

## 2019-07-29 DIAGNOSIS — M19042 Primary osteoarthritis, left hand: Secondary | ICD-10-CM | POA: Diagnosis not present

## 2019-07-29 DIAGNOSIS — I051 Rheumatic mitral insufficiency: Secondary | ICD-10-CM | POA: Diagnosis not present

## 2019-07-29 DIAGNOSIS — H9190 Unspecified hearing loss, unspecified ear: Secondary | ICD-10-CM | POA: Diagnosis not present

## 2019-07-29 DIAGNOSIS — M47815 Spondylosis without myelopathy or radiculopathy, thoracolumbar region: Secondary | ICD-10-CM | POA: Diagnosis not present

## 2019-07-29 DIAGNOSIS — I739 Peripheral vascular disease, unspecified: Secondary | ICD-10-CM | POA: Diagnosis not present

## 2019-07-31 ENCOUNTER — Encounter: Payer: Self-pay | Admitting: Internal Medicine

## 2019-07-31 ENCOUNTER — Ambulatory Visit (INDEPENDENT_AMBULATORY_CARE_PROVIDER_SITE_OTHER): Payer: PPO | Admitting: Internal Medicine

## 2019-07-31 ENCOUNTER — Other Ambulatory Visit: Payer: Self-pay

## 2019-07-31 ENCOUNTER — Telehealth: Payer: Self-pay

## 2019-07-31 DIAGNOSIS — R7881 Bacteremia: Secondary | ICD-10-CM

## 2019-07-31 DIAGNOSIS — B9561 Methicillin susceptible Staphylococcus aureus infection as the cause of diseases classified elsewhere: Secondary | ICD-10-CM

## 2019-07-31 MED ORDER — CEPHALEXIN 500 MG PO CAPS: 500.0000 mg | ORAL_CAPSULE | Freq: Three times a day (TID) | ORAL | 0 refills | Status: AC

## 2019-07-31 MED ORDER — CEPHALEXIN 500 MG PO CAPS: 500.0000 mg | ORAL_CAPSULE | Freq: Three times a day (TID) | ORAL | 0 refills | Status: DC

## 2019-07-31 NOTE — Progress Notes (Signed)
[facility] for Infectious Disease  Reason for Consult: MSSA bacteremia Referring Provider: Dr. Shanon Brow Tat  Assessment: He is improving on therapy for staph aureus bacteremia.  Suspect that his recent dermatitis may have been the source of his infection.  Although he had no evidence of endocarditis by TEE his prior mitral valve endocarditis does make him at greater risk of this.  I talked to him about management options including stopping antibiotics now versus stopping his IV antibiotic, having his PICC removed and extending therapy with 3 more weeks of oral cephalexin.  He prefers the latter option.  He will follow-up here in 6 weeks.   Plan: 1. Discontinue cefazolin and have his PICC removed 2. Cephalexin 500 mg 3 times daily for 3 more weeks 3. Follow-up here in 6 weeks   Patient Active Problem List   Diagnosis Date Noted  . Staphylococcus aureus sepsis ([place]) 07/17/2019  . Macrocytic anemia 07/15/2019  . Staphylococcus aureus bacteremia 07/12/2019  . SIRS (systemic inflammatory response syndrome) ([place]) 07/11/2019  . BPH (benign prostatic hyperplasia) 10/22/2015  . Recurrent left inguinal hernia s/p lap repair w mesh 08/13/2015 08/13/2015  . Right inguinal hernia s/p lap repair w mesh 08/13/2015  . History of SBE (subacute bacterial endocarditis) 07/06/2013  . Mitral regurgitation due to partially flail posterior mitral valve leaflet 07/30/2012  . Hyperlipidemia   . Rectal cancer - distal s/p lap LAR/coloanal July 2014 - [id number]. 07/27/2012  . Hypertension     Patient's Medications  New Prescriptions   CEPHALEXIN (KEFLEX) 500 MG CAPSULE    Take 1 capsule (500 mg total) by mouth 3 (three) times daily for 22 days.  Previous Medications   ACETAMINOPHEN (TYLENOL) 500 MG TABLET    Take 1,000 mg by mouth every 6 (six) hours as needed for headache.    DICLOFENAC SODIUM (VOLTAREN) 1 % GEL    Apply 4 g topically 4 (four) times daily. (for joint pain)   HYDROCHLOROTHIAZIDE (HYDRODIURIL) 25 MG TABLET    TAKE 1 TABLET BY MOUTH ONCE DAILY. STOPPING CHLORTHALIDONE. MED CHANGED 07/12/2018   HYDROCORTISONE (PROCTO-MED HC) 2.5 % RECTAL CREAM    Place 1 application rectally 2 (two) times daily. x7-10 days prn hemorrhoid (may need to use brand for ins coverage)   KETOCONAZOLE (NIZORAL) 2 % CREAM    APPLY TOPICALLY TWICE DAILY   LISINOPRIL (ZESTRIL) 40 MG TABLET    Take 1 tablet by mouth once daily   MULTIPLE VITAMIN (MULTIVITAMIN WITH MINERALS) TABS    Take 1 tablet by mouth every morning.    OXYMETAZOLINE (AFRIN) 0.05 % NASAL SPRAY    Place 1 spray into both nostrils 2 (two) times daily as needed for congestion.   PRAVASTATIN (PRAVACHOL) 20 MG TABLET    Take 1 tablet (20 mg total) by mouth daily.   PROBIOTIC PRODUCT (DIGESTIVE ADVANTAGE PO)    Take 1 tablet by mouth daily.   PSYLLIUM (METAMUCIL) 58.6 % PACKET    Take 1 packet by mouth daily.   SERTRALINE (ZOLOFT) 25 MG TABLET    Take 1 tablet (25 mg total) by mouth daily.   TRIAMCINOLONE OINTMENT (KENALOG) 0.1 %    APPLY  OINTMENT TOPICALLY TO AFFECTED AREA THREE TIMES DAILY  Modified Medications   No medications on file  Discontinued Medications   CEFAZOLIN (ANCEF) IVPB    Inject 2 g into the vein every 8 (eight) hours for 15 days. Indication:  MSSA bacteremia Last Day of Therapy:  07/31/19  Labs - Once weekly:  CBC/D and BMP, Labs - Every other week:  ESR and CRP    HPI: Tyler Diaz is a 74 y.o. male who was admitted to [facility] on 07/11/2019 with fever and tachycardia.  Both admission blood cultures grew MSSA.  He had developed some dermatitis on his neck about 3 weeks prior to his admission.  He has a history of enterococcal mitral valve endocarditis in 2015.  He improved fairly promptly on antibiotics.  Repeat blood cultures were negative.  He had no evidence of recurrent endocarditis by exam or TEE.  He was discharged on IV cefazolin.  He has now completed 3 weeks of therapy.  He has not  had any problems tolerating his antibiotic or PICC.  He is feeling much better.  Review of Systems: Review of Systems  Constitutional: Negative for chills, diaphoresis and fever.  Respiratory: Negative for cough and shortness of breath.   Cardiovascular: Negative for chest pain.  Gastrointestinal: Negative for abdominal pain, diarrhea, nausea and vomiting.  Genitourinary: Negative for dysuria.  Musculoskeletal: Negative for back pain.  Skin: Positive for itching and rash.      Past Medical History:  Diagnosis Date  . Anemia    hx of  . Arthritis    hands/knees  . Blood clot in vein    RT ARM WITH PICC LINE   . C. difficile colitis 08/19/2013  . Colon cancer ([place]) 07/18/12 bx   rectum=Invasive adenocarcinoma w/ extracellular mucin,polyp=benign  . GERD (gastroesophageal reflux disease)    OCCASIONAL  . Hearing loss    partial greater on left  . Heart murmur   . Hematochezia    recent  . Herpes zoster - R flank - with severe pain 03/11/2013  . History of shingles   . Hyperlipidemia   . Hypertension   . Hypothyroidism as child   hx of  . Ileostomy - diverting loop - s/p takedown 06/28/2013 01/14/2013  . Mitral regurgitation due to partially flail posterior mitral valve leaflet   . Nocturia   . Numbness    TOES / FINGERS DUE TO CHEMO  . Peripheral vascular disease (HCC)    neuropathy due to cancer treatment (fingers and toes)  . Radiation    FOR COLON CANCER  . Rectal cancer ([place])   . Rheumatic fever as child   hx  . Sinusitis    seasonal  . Tinnitus    left ear  . Umbilical hernia, incarcerated - s/p primary repair July 2014 01/03/2013    Social History   Tobacco Use  . Smoking status: Former Smoker    Packs/day: 1.50    Years: 20.00    Pack years: 30.00    Types: Cigarettes    Quit date: 07/27/1972    Years since quitting: 47.0  . Smokeless tobacco: Never Used  Substance Use Topics  . Alcohol use: No  . Drug use: No    Family History  Problem Relation  Age of Onset  . Cancer Mother        abdominal ca ?ovarian?  . Heart disease Father   . Cancer Sister        liver cancer  . Heart disease Brother   . COPD Brother   . Healthy Son   . Healthy Son    Allergies  Allergen Reactions  . Shrimp [Shellfish Allergy] Hives, Nausea And Vomiting and Rash    OBJECTIVE: Vitals:   07/31/19 1535  BP: (!) 160/90  Pulse:  79  Weight: 182 lb (82.6 kg)   Body mass index is 26.11 kg/m.   Physical Exam Constitutional:      Comments: He is alert and in no distress.  He is accompanied by his son.  Cardiovascular:     Rate and Rhythm: Normal rate and regular rhythm.     Heart sounds: Murmur present.     Comments: He has a 2/6 systolic murmur heard best in the left mid axillary line. Pulmonary:     Effort: Pulmonary effort is normal.     Breath sounds: Normal breath sounds.  Abdominal:     Palpations: Abdomen is soft.     Tenderness: There is no abdominal tenderness.  Musculoskeletal:        General: No swelling or tenderness.  Skin:    Findings: No rash.     Comments: Left arm PICC site looks good.  He still has some faint erythema around his neck.  Neurological:     General: No focal deficit present.  Psychiatric:        Mood and Affect: Mood normal.     Microbiology: No results found for this or any previous visit (from the past 240 hour(s)).  Michel Bickers, MD [facility] for Infectious [facility] [phone] pager   [phone] cell 07/31/2019, 5:13 PM

## 2019-07-31 NOTE — Telephone Encounter (Signed)
Per Dr. Megan Salon called Tyler Diaz to relay verbal orders to pull PICC asap and discontinue antibiotics. Verbal orders repeated back. RN to visit home tomorrow 2/25 and will pull PICC then.   Amberley Hamler Lorita Officer, RN

## 2019-07-31 NOTE — Assessment & Plan Note (Signed)
He is improving on therapy for staph aureus bacteremia.  Suspect that his recent dermatitis may have been the source of his infection.  Although he had no evidence of endocarditis by TEE his prior mitral valve endocarditis does make him at greater risk of this.  I talked to him about management options including stopping antibiotics now versus stopping his IV antibiotic, having his PICC removed and extending therapy with 3 more weeks of oral cephalexin.  He prefers the latter option.  He will follow-up here in 6 weeks.

## 2019-08-09 ENCOUNTER — Other Ambulatory Visit: Payer: Self-pay | Admitting: *Deleted

## 2019-08-09 DIAGNOSIS — E785 Hyperlipidemia, unspecified: Secondary | ICD-10-CM

## 2019-08-09 MED ORDER — PRAVASTATIN SODIUM 20 MG PO TABS: 20.0000 mg | ORAL_TABLET | Freq: Every day | ORAL | 1 refills | Status: DC

## 2019-08-12 ENCOUNTER — Encounter: Payer: Self-pay | Admitting: Cardiology

## 2019-08-12 ENCOUNTER — Ambulatory Visit: Payer: PPO | Admitting: Cardiology

## 2019-08-12 ENCOUNTER — Ambulatory Visit (INDEPENDENT_AMBULATORY_CARE_PROVIDER_SITE_OTHER): Payer: PPO | Admitting: Cardiology

## 2019-08-12 ENCOUNTER — Other Ambulatory Visit: Payer: Self-pay

## 2019-08-12 VITALS — BP 126/82 | HR 83 | Ht 70.0 in | Wt 182.8 lb

## 2019-08-12 DIAGNOSIS — I10 Essential (primary) hypertension: Secondary | ICD-10-CM

## 2019-08-12 DIAGNOSIS — E782 Mixed hyperlipidemia: Secondary | ICD-10-CM | POA: Diagnosis not present

## 2019-08-12 DIAGNOSIS — I340 Nonrheumatic mitral (valve) insufficiency: Secondary | ICD-10-CM

## 2019-08-12 NOTE — Progress Notes (Signed)
Clinical Summary Tyler Diaz is a 74 y.o.male  seen today for follow up of the following medical problems.    1. Mitral regurgitation -11/2017 echo LVEF 60%, moderate to severe MR. Partial flail cusp of posterior leaflet. LVIDs 3.6 - 08/2013 TEE flail segment of P2 scallop posterior leaflet due to ruptured chordae, moderate to severe MR  03/2019 TTE There is posterior leaflet prolapse and  a calcified density on the atrial side of the posterior leaflet that could  represent a partial flail subsegment of the posterior leaflet or perhaps  an old vegetation. There is  eccentric, anteriorly directed, moderate to severe mitral valve  Regurgitation   07/2019 TEE: Ruptured chordae attached to the posterior leaflet with  partial prolapse of P2. Chronic finding when compared to 07/2013 TEE. There  is eccentric anterior directed MR thats difficult to quantify due to  eccentricity, appears moderate to  severe.  - no recent LE edema. No significant SOB or DOE  remains doing his yard work.   2. HTN - he is compliant with meds  3. Hyperlipidemia - 01/2018 TC 142 TG 173 HDL 37 LDL 70 - labs followed by pcp  4. Colorectal cancer - s/p surgery,currently cancer free.   5. Recent Sepsis/Bacteremia - recent admission 07/2019 with sepsis and staph aureus bacteremia - TEE with signs of endocarditis  Past Medical History:  Diagnosis Date  . Anemia    hx of  . Arthritis    hands/knees  . Blood clot in vein    RT ARM WITH PICC LINE   . C. difficile colitis 08/19/2013  . Colon cancer ([place]) 07/18/12 bx   rectum=Invasive adenocarcinoma w/ extracellular mucin,polyp=benign  . GERD (gastroesophageal reflux disease)    OCCASIONAL  . Hearing loss    partial greater on left  . Heart murmur   . Hematochezia    recent  . Herpes zoster - R flank - with severe pain 03/11/2013  . History of shingles   . Hyperlipidemia   . Hypertension   . Hypothyroidism as child   hx of  .  Ileostomy - diverting loop - s/p takedown 06/28/2013 01/14/2013  . Mitral regurgitation due to partially flail posterior mitral valve leaflet   . Nocturia   . Numbness    TOES / FINGERS DUE TO CHEMO  . Peripheral vascular disease (HCC)    neuropathy due to cancer treatment (fingers and toes)  . Radiation    FOR COLON CANCER  . Rectal cancer ([place])   . Rheumatic fever as child   hx  . Sinusitis    seasonal  . Tinnitus    left ear  . Umbilical hernia, incarcerated - s/p primary repair July 2014 01/03/2013     Allergies  Allergen Reactions  . Shrimp [Shellfish Allergy] Hives, Nausea And Vomiting and Rash     Current Outpatient Medications  Medication Sig Dispense Refill  . acetaminophen (TYLENOL) 500 MG tablet Take 1,000 mg by mouth every 6 (six) hours as needed for headache.     . cephALEXin (KEFLEX) 500 MG capsule Take 1 capsule (500 mg total) by mouth 3 (three) times daily for 22 days. 63 capsule 0  . diclofenac sodium (VOLTAREN) 1 % GEL Apply 4 g topically 4 (four) times daily. (for joint pain) 400 g 2  . hydrochlorothiazide (HYDRODIURIL) 25 MG tablet TAKE 1 TABLET BY MOUTH ONCE DAILY. STOPPING CHLORTHALIDONE. MED CHANGED 07/12/2018 (Patient taking differently: Take 25 mg by mouth daily. ) 90 tablet 1  .  hydrocortisone (PROCTO-MED HC) 2.5 % rectal cream Place 1 application rectally 2 (two) times daily. x7-10 days prn hemorrhoid (may need to use brand for ins coverage) (Patient taking differently: Place 1 application rectally 2 (two) times daily as needed for hemorrhoids or anal itching. x7-10 days prn hemorrhoid (may need to use brand for ins coverage)) 30 g 0  . ketoconazole (NIZORAL) 2 % cream APPLY TOPICALLY TWICE DAILY (Patient taking differently: Apply 1 application topically 2 (two) times daily. ) 60 g 0  . lisinopril (ZESTRIL) 40 MG tablet Take 1 tablet by mouth once daily (Patient taking differently: Take 40 mg by mouth daily. ) 90 tablet 0  . Multiple Vitamin (MULTIVITAMIN WITH  MINERALS) TABS Take 1 tablet by mouth every morning.     Marland Kitchen oxymetazoline (AFRIN) 0.05 % nasal spray Place 1 spray into both nostrils 2 (two) times daily as needed for congestion.    . pravastatin (PRAVACHOL) 20 MG tablet Take 1 tablet (20 mg total) by mouth daily. 90 tablet 1  . Probiotic Product (DIGESTIVE ADVANTAGE PO) Take 1 tablet by mouth daily.    . psyllium (METAMUCIL) 58.6 % packet Take 1 packet by mouth daily.    . sertraline (ZOLOFT) 25 MG tablet Take 1 tablet (25 mg total) by mouth daily. 30 tablet 2  . triamcinolone ointment (KENALOG) 0.1 % APPLY  OINTMENT TOPICALLY TO AFFECTED AREA THREE TIMES DAILY 30 g 0   No current facility-administered medications for this visit.     Past Surgical History:  Procedure Laterality Date  . CARPAL TUNNEL RELEASE Right 2002  . ILEOSTOMY N/A 12/27/2012   Procedure: DIVERTING LOOP ILEOSTOMY;  Surgeon: Adin Hector, MD;  Location: [facility];  Service: General;  Laterality: N/A;  . ILEOSTOMY CLOSURE N/A 06/28/2013   Procedure: loop ILEOSTOMY TAKEDOWN examination of anesthesia ;  Surgeon: Adin Hector, MD;  Location: [facility];  Service: General;  Laterality: N/A;  . INGUINAL HERNIA REPAIR Left   . INGUINAL HERNIA REPAIR Bilateral 08/13/2015   Procedure: LAPAROSCOPIC BILATERAL INGUINAL HERNIA REPAIR WITH MESH;  Surgeon: Michael Boston, MD;  Location: [facility];  Service: General;  Laterality: Bilateral;  . INSERTION OF MESH Bilateral 08/13/2015   Procedure: INSERTION OF MESH;  Surgeon: Michael Boston, MD;  Location: [facility];  Service: General;  Laterality: Bilateral;  . LAPAROSCOPIC LOW ANTERIOR RESCECTION WITH COLOANAL ANASTOMOSIS N/A 12/27/2012   Procedure: LAPAROSCOPIC LOW ANTERIOR RESCECTION WITH COLOANAL ANASTOMOSIS;  Surgeon: Adin Hector, MD;  Location: [facility];  Service: General;  Laterality: N/A;  . LAPAROSCOPIC LOW ANTERIOR RESECTION N/A 12/27/2012   Procedure: LAPAROSCOPIC LOW ANTERIOR RESECTION, COLO-ANAL ANASTOMOSIS, DIVERTING LOOP ILEOSTOMY,SPLENIC  FLEXURE MOBILIZATION, PRIMARY INCARCERATED UMBILICAL HERNIA REPAIR;  Surgeon: Adin Hector, MD;  Location[facility];  Service: General;  Laterality: N/A;  . TEE WITHOUT CARDIOVERSION N/A 07/10/2013   Procedure: TRANSESOPHAGEAL ECHOCARDIOGRAM (TEE);  Surgeon: Candee Furbish, MD;  Location: [facility] ENDOSCOPY;  Service: Cardiovascular;  Laterality: N/A;  . TEE WITHOUT CARDIOVERSION N/A 08/21/2013   Procedure: TRANSESOPHAGEAL ECHOCARDIOGRAM (TEE);  Surgeon: Sanda Klein, MD;  Location: [facility] ENDOSCOPY;  Service: Cardiovascular;  Laterality: N/A;  . TEE WITHOUT CARDIOVERSION N/A 07/16/2019   Procedure: TRANSESOPHAGEAL ECHOCARDIOGRAM (TEE) WITH PROPOFOL;  Surgeon: Arnoldo Lenis, MD;  Location: [facility];  Service: Endoscopy;  Laterality: N/A;  . TONSILLECTOMY     as child  . UMBILICAL HERNIA REPAIR  12/27/2012   Procedure: PRIMARY REPAIR INCARCERATED UMBILICAL HERNIA;  Surgeon: Adin Hector, MD;  Location: [facility];  Service: General;;     Allergies  Allergen Reactions  . Shrimp [Shellfish Allergy] Hives, Nausea And Vomiting and Rash      Family History  Problem Relation Age of Onset  . Cancer Mother        abdominal ca ?ovarian?  . Heart disease Father   . Cancer Sister        liver cancer  . Heart disease Brother   . COPD Brother   . Healthy Son   . Healthy Son      Social History Tyler Diaz reports that he quit smoking about 47 years ago. His smoking use included cigarettes. He has a 30.00 pack-year smoking history. He has never used smokeless tobacco. Tyler Diaz reports no history of alcohol use.   Review of Systems CONSTITUTIONAL: No weight loss, fever, chills, weakness or fatigue.  HEENT: Eyes: No visual loss, blurred vision, double vision or yellow sclerae.No hearing loss, sneezing, congestion, runny nose or sore throat.  SKIN: No rash or itching.  CARDIOVASCULAR: per hpi RESPIRATORY: No shortness of breath, cough or sputum.  GASTROINTESTINAL: No anorexia, nausea, vomiting or  diarrhea. No abdominal pain or blood.  GENITOURINARY: No burning on urination, no polyuria NEUROLOGICAL: No headache, dizziness, syncope, paralysis, ataxia, numbness or tingling in the extremities. No change in bowel or bladder control.  MUSCULOSKELETAL: No muscle, back pain, joint pain or stiffness.  LYMPHATICS: No enlarged nodes. No history of splenectomy.  PSYCHIATRIC: No history of depression or anxiety.  ENDOCRINOLOGIC: No reports of sweating, cold or heat intolerance. No polyuria or polydipsia.  Marland Kitchen   Physical Examination Today's Vitals   08/12/19 0902  BP: 126/82  Pulse: 83  SpO2: 96%  Weight: 182 lb 12.8 oz (82.9 kg)  Height: 5\' 10"  (1.778 m)   Body mass index is 26.23 kg/m.  Gen: resting comfortably, no acute distress HEENT: no scleral icterus, pupils equal round and reactive, no palptable cervical adenopathy,  CV: RRR, 3/6 systolic murmur apex, no jvd.  Resp: Clear to auscultation bilaterally GI: abdomen is soft, non-tender, non-distended, normal bowel sounds, no hepatosplenomegaly MSK: extremities are warm, no edema.  Skin: warm, no rash Neuro:  no focal deficits Psych: appropriate affect   Diagnostic Studies  03/2019 echo IMPRESSIONS    1. Left ventricular ejection fraction, by visual estimation, is 55 to  60%. The left ventricle has normal function. Normal left ventricular size.  There is mildly increased left ventricular hypertrophy.  2. Global right ventricle has normal systolic function.The right  ventricular size is normal. No increase in right ventricular wall  thickness.  3. Left atrial size was mildly dilated.  4. Right atrial size was normal.  5. Mild aortic valve annular calcification.  6. The mitral valve is abnormal. There is posterior leaflet prolapse and  a calcified density on the atrial side of the posterior leaflet that could  represent a partial flail subsegment of the posterior leaflet or perhaps  an old vegetation. There is    eccentric, anteriorly directed, moderate to severe mitral valve  regurgitation. PISA calculations are challenging with very eccentric jet,  but one image suggests MR radius is at least 1 cm which is consistent with  severe regurgitation. Suggest TEE for  further evaluation if clinically indicated.  7. The tricuspid valve is grossly normal. Tricuspid valve regurgitation  is mild.  8. The aortic valve is tricuspid Aortic valve regurgitation is trivial by  color flow Doppler.  9. The pulmonic valve was grossly normal. Pulmonic valve regurgitation is  mild by color flow Doppler.  10. Normal pulmonary artery systolic pressure.  11. The tricuspid regurgitant velocity is 2.36 m/s, and with an assumed  right atrial pressure of 3 mmHg, the estimated right ventricular systolic  pressure is normal at 25.3 mmHg.  12. The inferior vena cava is normal in size with greater than 50%  respiratory variability, suggesting right atrial pressure of 3 mmHg.   In comparison to the previous echocardiogram(s): Previous Echo at [facility]  showed LV EF 60%, mild LAE, moderate to severe MR with a wall-impinging MR  jet and a flail cusp of the posterior leaflet. Mild MR, PI and dilated  IVC. These images were not able  to be viewed directly.  FINDINGS  Left Ventricle: Left ventricular ejection fraction, by visual estimation,  is 55 to 60%. The left ventricle has normal function. There is mildly  increased left ventricular hypertrophy. Normal left ventricular size.  Spectral Doppler shows Left ventricular  diastolic parameters were normal pattern of LV diastolic filling.    [id number] TEE IMPRESSIONS    1. Left ventricular ejection fraction, by estimation, is 60 to 65%. The  left ventricle has normal function. The left ventrical has no regional  wall motion abnormalities. Left ventricular diastolic function could not  be evaluated.  2. Right ventricular systolic function is normal. The right ventricular   size is normal.  3. Left atrial size was moderately dilated. No left atrial/left atrial  appendage thrombus was detected.  4. Right atrial size was moderately dilated.  5. Ruptured chordae attached to the posterior leaflet with partial  prolapse of P2. Chronic finding when compared to 07/2013 TEE. There is  eccentric anterior directed MR thats difficult to quantify due to  eccentricity, appears moderate to severe. . The  mitral valve is abnormal. Moderate to severe MR mitral valve  regurgitation. No evidence of mitral stenosis.  6. Tricuspid valve regurgitation is mild to moderate.  7. The aortic valve is tricuspid. Aortic valve regurgitation is trivial .  No aortic stenosis is present.    Assessment and Plan   1. Mitral regurgitation - moderate to severe. Asymptomatic, no evidence of LV enlargement or dysfunction - continue to monitor at this time, he does not meet criteria for intervention at this point.    2. HTN - he prefers HCTZ instead of chlorthaldone due to cost - at goal, continue current meds  3. Hyperlipidemia - he will continue statin  EKG today shows NSR   F/u 6 months      Arnoldo Lenis, M.D.

## 2019-08-12 NOTE — Patient Instructions (Signed)

## 2019-08-15 ENCOUNTER — Other Ambulatory Visit: Payer: Self-pay | Admitting: *Deleted

## 2019-08-15 DIAGNOSIS — E785 Hyperlipidemia, unspecified: Secondary | ICD-10-CM

## 2019-08-21 ENCOUNTER — Telehealth: Payer: Self-pay | Admitting: Family Medicine

## 2019-08-21 NOTE — Chronic Care Management (AMB) (Signed)
  Chronic Care Management   Note  08/21/2019 Name: RAYKWON HOBBS MRN: [id number] DOB: 1945/10/19  Tyler Diaz is a 74 y.o. year old male who is a primary care patient of Janora Norlander, DO. I reached out to [name] by phone today in response to a referral sent by Mr. [name] Rabold's health plan.     Mr. Carrigan was given information about Chronic Care Management services today including:  1. CCM service includes personalized support from designated clinical staff supervised by his physician, including individualized plan of care and coordination with other care providers 2. 24/7 contact phone numbers for assistance for urgent and routine care needs. 3. Service will only be billed when office clinical staff spend 20 minutes or more in a month to coordinate care. 4. Only one practitioner may furnish and bill the service in a calendar month. 5. The patient may stop CCM services at any time (effective at the end of the month) by phone call to the office staff. 6. The patient will be responsible for cost sharing (co-pay) of up to 20% of the service fee (after annual deductible is met).  Patient did not agree to enrollment in care management services and does not wish to consider at this time due to not having aviablibility with CCM RN CM until July 2021.  Follow up plan: The care management team is available to follow up with the patient after provider conversation with the patient regarding recommendation for care management engagement and subsequent re-referral to the care management team.  The patient has been provided with contact information for the care management team and has been advised to call with any health related questions or concerns.   [place], [place] [address] Direct Dial: [phone] [email or web address]

## 2019-09-02 ENCOUNTER — Other Ambulatory Visit: Payer: Self-pay | Admitting: Cardiology

## 2019-09-04 ENCOUNTER — Other Ambulatory Visit: Payer: Self-pay

## 2019-09-04 ENCOUNTER — Encounter: Payer: Self-pay | Admitting: Family Medicine

## 2019-09-04 ENCOUNTER — Ambulatory Visit (INDEPENDENT_AMBULATORY_CARE_PROVIDER_SITE_OTHER): Payer: PPO | Admitting: Family Medicine

## 2019-09-04 VITALS — BP 137/79 | HR 76 | Temp 97.8°F | Ht 70.0 in | Wt 182.0 lb

## 2019-09-04 DIAGNOSIS — F419 Anxiety disorder, unspecified: Secondary | ICD-10-CM

## 2019-09-04 MED ORDER — MIRTAZAPINE 7.5 MG PO TABS: ORAL_TABLET | ORAL | 0 refills | Status: DC

## 2019-09-04 NOTE — Patient Instructions (Signed)
I am changing your medication to Mirtazapine.  Take 1 tablet every night at bedtime for 2 weeks.  Then increase to 2 tablets at bedtime.  This can help with sleep, anxiety, depression and appetite.    See me back in 4 weeks in office or by phone to recheck symptoms and see if we need to go up any more.

## 2019-09-04 NOTE — Progress Notes (Signed)
Subjective: CC: f/u anxiety PCP: Janora Norlander, DO [id number] Tyler Diaz is a 74 y.o. male presenting to clinic today for:  1. GAD Patient was seen on 07/24/2019 for hospital follow-up.  At that time, he had noticed increasing anxiety and requested to be started on medication.  He was started on Zoloft 25 mg daily.  He was on this medicine for about 3 weeks but self discontinued because he did not find that this was helping at all with his symptoms.  He notes intermittent good sleep but unless he is exhausted himself 3 being active throughout the day, he is unable to sleep at nighttime.  He reports good appetite but does not eat a lot during meals.  He reports nervousness throughout the day, sometimes surrounding his cancer.  ROS: Per HPI  Allergies  Allergen Reactions  . Shrimp [Shellfish Allergy] Hives, Nausea And Vomiting and Rash    "only shrimp" - crustaceans - blisters, n/v    Past Medical History:  Diagnosis Date  . Anemia    hx of  . Arthritis    hands/knees  . Blood clot in vein    RT ARM WITH PICC LINE   . C. difficile colitis 08/19/2013  . Colon cancer ([place]) 07/18/12 bx   rectum=Invasive adenocarcinoma w/ extracellular mucin,polyp=benign  . GERD (gastroesophageal reflux disease)    OCCASIONAL  . Hearing loss    partial greater on left  . Heart murmur   . Hematochezia    recent  . Herpes zoster - R flank - with severe pain 03/11/2013  . History of shingles   . Hyperlipidemia   . Hypertension   . Hypothyroidism as child   hx of  . Ileostomy - diverting loop - s/p takedown 06/28/2013 01/14/2013  . Mitral regurgitation due to partially flail posterior mitral valve leaflet   . Nocturia   . Numbness    TOES / FINGERS DUE TO CHEMO  . Peripheral vascular disease (HCC)    neuropathy due to cancer treatment (fingers and toes)  . Radiation    FOR COLON CANCER  . Rectal cancer ([place])   . Rheumatic fever as child   hx  . Sinusitis    seasonal  . Tinnitus    left ear   . Umbilical hernia, incarcerated - s/p primary repair July 2014 01/03/2013    Current Outpatient Medications:  .  acetaminophen (TYLENOL) 500 MG tablet, Take 1,000 mg by mouth every 6 (six) hours as needed for headache. , Disp: , Rfl:  .  diclofenac sodium (VOLTAREN) 1 % GEL, Apply 4 g topically 4 (four) times daily. (for joint pain), Disp: 400 g, Rfl: 2 .  hydrochlorothiazide (HYDRODIURIL) 25 MG tablet, TAKE 1 TABLET BY MOUTH ONCE DAILY. STOPPING CHLORTHALIDONE, Disp: 90 tablet, Rfl: 0 .  hydrocortisone (PROCTO-MED HC) 2.5 % rectal cream, Place 1 application rectally 2 (two) times daily. x7-10 days prn hemorrhoid (may need to use brand for ins coverage) (Patient taking differently: Place 1 application rectally 2 (two) times daily as needed for hemorrhoids or anal itching. x7-10 days prn hemorrhoid (may need to use brand for ins coverage)), Disp: 30 g, Rfl: 0 .  ketoconazole (NIZORAL) 2 % cream, APPLY TOPICALLY TWICE DAILY (Patient taking differently: Apply 1 application topically 2 (two) times daily. ), Disp: 60 g, Rfl: 0 .  lisinopril (ZESTRIL) 40 MG tablet, Take 1 tablet by mouth once daily (Patient taking differently: Take 40 mg by mouth daily. ), Disp: 90 tablet, Rfl: 0 .  Multiple Vitamin (MULTIVITAMIN WITH MINERALS) TABS, Take 1 tablet by mouth every morning. , Disp: , Rfl:  .  oxymetazoline (AFRIN) 0.05 % nasal spray, Place 1 spray into both nostrils 2 (two) times daily as needed for congestion., Disp: , Rfl:  .  pravastatin (PRAVACHOL) 20 MG tablet, Take 1 tablet (20 mg total) by mouth daily., Disp: 90 tablet, Rfl: 1 .  Probiotic Product (DIGESTIVE ADVANTAGE PO), Take 1 tablet by mouth daily., Disp: , Rfl:  .  psyllium (METAMUCIL) 58.6 % packet, Take 1 packet by mouth daily., Disp: , Rfl:  .  sertraline (ZOLOFT) 25 MG tablet, Take 1 tablet (25 mg total) by mouth daily. (Patient not taking: Reported on 09/04/2019), Disp: 30 tablet, Rfl: 2 .  triamcinolone ointment (KENALOG) 0.1 %, APPLY   OINTMENT TOPICALLY TO AFFECTED AREA THREE TIMES DAILY, Disp: 30 g, Rfl: 0 Social History   Socioeconomic History  . Marital status: Widowed    Spouse name: Not on file  . Number of children: 2  . Years of education: 10  . Highest education level: 10th grade  Occupational History  . 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 Smoking status: Former Smoker    Packs/day: 1.50    Years: 20.00    Pack years: 30.00    Types: Cigarettes    Quit date: 07/27/1972    Years since quitting: 47.1  . Smokeless tobacco: Never Used  Substance and Sexual Activity  . Alcohol use: No  . Drug use: No  . Sexual activity: Not Currently  Other Topics Concern  . 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  Has two adult children and one grandchild. One son lives with him.    Social Determinants of Health   Financial Resource Strain: Low Risk   . Difficulty of Paying Living Expenses: Not hard at all  Food Insecurity:   . Worried About Charity fundraiser in the Last Year:   . Arboriculturist in the Last Year:   Transportation Needs:   . Film/video editor (Medical):   Marland Kitchen Lack of Transportation (Non-Medical):   Physical Activity: Inactive  . Days of Exercise per Week: 0 days  . Minutes of Exercise per Session: 0 min  Stress:   . Feeling of Stress :   Social Connections:   . Frequency of Communication with Friends and Family:   . Frequency of Social Gatherings with Friends and Family:   . Attends Religious Services:   . Active Member of Clubs or Organizations:   . Attends Archivist Meetings:   Marland Kitchen Marital Status:   Intimate Partner Violence:   . Fear of Current or Ex-Partner:   . Emotionally Abused:   Marland Kitchen Physically Abused:   . Sexually Abused:    Family History  Problem Relation Age of Onset  . Cancer Mother        abdominal ca ?ovarian?  . Heart disease Father   .  Cancer Sister        liver cancer  . Heart disease Brother   . COPD Brother   . Healthy Son   . Healthy Son     Objective: Office vital signs reviewed. BP 137/79   Pulse 76   Temp 97.8 F (36.6 C) (Temporal)   Ht 5\' 10"  (1.778 m)   Wt 182 lb (82.6 kg)  SpO2 99%   BMI 26.11 kg/m   Physical Examination:  General: Awake, alert, well nourished, No acute distress HEENT: Normal, sclera white, MMM Cardio: regular rate and rhythm, S1S2 heard, 3/6 SEM at bilateral borders Pulm: clear to auscultation bilaterally, no wheezes, rhonchi or rales; normal work of breathing on room air Psych: Mood stable, speech normal, affect appropriate Depression screen [facility] 2/9 09/04/2019 07/31/2019 07/24/2019  Decreased Interest 1 0 2  Down, Depressed, Hopeless 1 0 2  PHQ - 2 Score 2 0 4  Altered sleeping 1 - 2  Tired, decreased energy 1 - 1  Change in appetite 0 - 0  Feeling bad or failure about yourself  1 - 2  Trouble concentrating 1 - 2  Moving slowly or fidgety/restless 0 - 0  Suicidal thoughts 0 - 0  PHQ-9 Score 6 - 11  Difficult doing work/chores Somewhat difficult - Somewhat difficult   GAD 7 : Generalized Anxiety Score 09/04/2019 07/24/2019 10/22/2015  Nervous, Anxious, on Edge 1 3 1   Control/stop worrying 0 3 1  Worry too much - different things 0 3 1  Trouble relaxing 2 2 1   Restless 1 2 0  Easily annoyed or irritable 1 2 1   Afraid - awful might happen 1 1 0  Total GAD 7 Score 6 16 5   Anxiety Difficulty Somewhat difficult - Not difficult at all   Assessment/ Plan: 74 y.o. male   1. Anxiety Scores actually are improved from his check in February.  I am going to switch him over to mirtazapine instead given age and comorbidities.  We will start 7.5 mg nightly.  After 2 weeks he may increase to 15 mg if he feels that this is necessary.  We will follow-up in 4 weeks either in office or via telephone for follow-up. - mirtazapine (REMERON) 7.5 MG tablet; Take 1 tablet (7.5 mg total) by mouth  at bedtime for 14 days, THEN 2 tablets (15 mg total) at bedtime for 16 days.  Dispense: 45 tablet; Refill: 0   No orders of the defined types were placed in this encounter.  No orders of the defined types were placed in this encounter.    Janora Norlander, DO [place] [phone]

## 2019-09-24 ENCOUNTER — Other Ambulatory Visit: Payer: Self-pay

## 2019-09-24 ENCOUNTER — Other Ambulatory Visit: Payer: Self-pay | Admitting: Family Medicine

## 2019-09-24 ENCOUNTER — Ambulatory Visit (INDEPENDENT_AMBULATORY_CARE_PROVIDER_SITE_OTHER): Payer: PPO | Admitting: Internal Medicine

## 2019-09-24 ENCOUNTER — Encounter: Payer: Self-pay | Admitting: Internal Medicine

## 2019-09-24 DIAGNOSIS — B9561 Methicillin susceptible Staphylococcus aureus infection as the cause of diseases classified elsewhere: Secondary | ICD-10-CM

## 2019-09-24 DIAGNOSIS — I10 Essential (primary) hypertension: Secondary | ICD-10-CM

## 2019-09-24 DIAGNOSIS — R7881 Bacteremia: Secondary | ICD-10-CM

## 2019-09-24 NOTE — Progress Notes (Signed)
[facility]  Patient Active Problem List   Diagnosis Date Noted  . Staphylococcus aureus sepsis ([place]) 07/17/2019  . Macrocytic anemia 07/15/2019  . Staphylococcus aureus bacteremia 07/12/2019  . SIRS (systemic inflammatory response syndrome) ([place]) 07/11/2019  . BPH (benign prostatic hyperplasia) 10/22/2015  . Recurrent left inguinal hernia s/p lap repair w mesh 08/13/2015 08/13/2015  . Right inguinal hernia s/p lap repair w mesh 08/13/2015  . History of SBE (subacute bacterial endocarditis) 07/06/2013  . Mitral regurgitation due to partially flail posterior mitral valve leaflet 07/30/2012  . Hyperlipidemia   . Rectal cancer - distal s/p lap LAR/coloanal July 2014 - [id number]. 07/27/2012  . Hypertension     Patient's Medications  New Prescriptions   No medications on file  Previous Medications   ACETAMINOPHEN (TYLENOL) 500 MG TABLET    Take 1,000 mg by mouth every 6 (six) hours as needed for headache.    DICLOFENAC SODIUM (VOLTAREN) 1 % GEL    Apply 4 g topically 4 (four) times daily. (for joint pain)   HYDROCHLOROTHIAZIDE (HYDRODIURIL) 25 MG TABLET    TAKE 1 TABLET BY MOUTH ONCE DAILY. STOPPING CHLORTHALIDONE   HYDROCORTISONE (PROCTO-MED HC) 2.5 % RECTAL CREAM    Place 1 application rectally 2 (two) times daily. x7-10 days prn hemorrhoid (may need to use brand for ins coverage)   KETOCONAZOLE (NIZORAL) 2 % CREAM    APPLY TOPICALLY TWICE DAILY   LISINOPRIL (ZESTRIL) 40 MG TABLET    Take 1 tablet by mouth once daily   MIRTAZAPINE (REMERON) 7.5 MG TABLET    Take 1 tablet (7.5 mg total) by mouth at bedtime for 14 days, THEN 2 tablets (15 mg total) at bedtime for 16 days.   MULTIPLE VITAMIN (MULTIVITAMIN WITH MINERALS) TABS    Take 1 tablet by mouth every morning.    OXYMETAZOLINE (AFRIN) 0.05 % NASAL SPRAY    Place 1 spray into both nostrils 2 (two) times daily as needed for congestion.   PRAVASTATIN (PRAVACHOL) 20 MG TABLET    Take 1 tablet (20 mg  total) by mouth daily.   PROBIOTIC PRODUCT (DIGESTIVE ADVANTAGE PO)    Take 1 tablet by mouth daily.   PSYLLIUM (METAMUCIL) 58.6 % PACKET    Take 1 packet by mouth daily.   TRIAMCINOLONE OINTMENT (KENALOG) 0.1 %    APPLY  OINTMENT TOPICALLY TO AFFECTED AREA THREE TIMES DAILY  Modified Medications   No medications on file  Discontinued Medications   No medications on file    Subjective: Tyler Diaz is in for his routine follow-up visit.  He is accompanied by his son.  He was hospitalized February with MSSA bacteremia.  Repeat blood cultures became negative fairly quickly and there was no evidence of endocarditis by exam or TEE.  I did give him a total of 6 weeks of antibiotic therapy because a history of mitral valve endocarditis several years ago.  He completed therapy on 08/21/2019.  Had any fever, chills, sweats or other signs or symptoms of relapse.  Review of Systems: Review of Systems  Constitutional: Negative for chills, diaphoresis and fever.    Past Medical History:  Diagnosis Date  . Anemia    hx of  . Arthritis    hands/knees  . Blood clot in vein    RT ARM WITH PICC LINE   . C. difficile colitis 08/19/2013  . Colon cancer ([place]) 07/18/12 bx   rectum=Invasive adenocarcinoma w/ extracellular mucin,polyp=benign  .  GERD (gastroesophageal reflux disease)    OCCASIONAL  . Hearing loss    partial greater on left  . Heart murmur   . Hematochezia    recent  . Herpes zoster - R flank - with severe pain 03/11/2013  . History of shingles   . Hyperlipidemia   . Hypertension   . Hypothyroidism as child   hx of  . Ileostomy - diverting loop - s/p takedown 06/28/2013 01/14/2013  . Mitral regurgitation due to partially flail posterior mitral valve leaflet   . Nocturia   . Numbness    TOES / FINGERS DUE TO CHEMO  . Peripheral vascular disease (HCC)    neuropathy due to cancer treatment (fingers and toes)  . Radiation    FOR COLON CANCER  . Rectal cancer ([place])   . Rheumatic fever as  child   hx  . Sinusitis    seasonal  . Tinnitus    left ear  . Umbilical hernia, incarcerated - s/p primary repair July 2014 01/03/2013    Social History   Tobacco Use  . Smoking status: Former Smoker    Packs/day: 1.50    Years: 20.00    Pack years: 30.00    Types: Cigarettes    Quit date: 07/27/1972    Years since quitting: 47.1  . Smokeless tobacco: Never Used  Substance Use Topics  . Alcohol use: No  . Drug use: No    Family History  Problem Relation Age of Onset  . Cancer Mother        abdominal ca ?ovarian?  . Heart disease Father   . Cancer Sister        liver cancer  . Heart disease Brother   . COPD Brother   . Healthy Son   . Healthy Son     Allergies  Allergen Reactions  . Shrimp [Shellfish Allergy] Hives, Nausea And Vomiting and Rash    "only shrimp" - crustaceans - blisters, n/v     Objective: Vitals:   09/24/19 1057  BP: (!) 155/88  Pulse: 86  Temp: 97.6 F (36.4 C)  SpO2: 98%  Weight: 181 lb (82.1 kg)  Height: 5\' 10"  (1.778 m)   Body mass index is 25.97 kg/m.  Physical Exam Constitutional:      General: He is not in acute distress. Cardiovascular:     Rate and Rhythm: Normal rate and regular rhythm.     Heart sounds: Murmur present.     Comments: There is no change in his chronic 2/6 systolic murmur. Psychiatric:        Mood and Affect: Mood normal.     Lab Results    Problem List Items Addressed This Visit      Unprioritized   Staphylococcus aureus bacteremia    I believe his MSSA bacteremia has been cured.  He may follow-up here as needed.          Tyler Bickers, MD [facility] [phone] pager   [phone] cell 09/24/2019, 11:11 AM

## 2019-09-24 NOTE — Assessment & Plan Note (Signed)
I believe his MSSA bacteremia has been cured.  He may follow-up here as needed.

## 2019-10-03 ENCOUNTER — Ambulatory Visit: Payer: PPO | Admitting: Family Medicine

## 2019-11-25 ENCOUNTER — Ambulatory Visit (INDEPENDENT_AMBULATORY_CARE_PROVIDER_SITE_OTHER): Payer: PPO | Admitting: Family Medicine

## 2019-11-25 ENCOUNTER — Encounter: Payer: Self-pay | Admitting: Family Medicine

## 2019-11-25 ENCOUNTER — Other Ambulatory Visit: Payer: Self-pay

## 2019-11-25 DIAGNOSIS — E782 Mixed hyperlipidemia: Secondary | ICD-10-CM | POA: Diagnosis not present

## 2019-11-25 DIAGNOSIS — Z85048 Personal history of other malignant neoplasm of rectum, rectosigmoid junction, and anus: Secondary | ICD-10-CM

## 2019-11-25 DIAGNOSIS — I10 Essential (primary) hypertension: Secondary | ICD-10-CM

## 2019-11-25 DIAGNOSIS — H1013 Acute atopic conjunctivitis, bilateral: Secondary | ICD-10-CM

## 2019-11-25 DIAGNOSIS — F419 Anxiety disorder, unspecified: Secondary | ICD-10-CM | POA: Diagnosis not present

## 2019-11-25 LAB — CBC WITH DIFFERENTIAL/PLATELET
Basophils Absolute: 0.1 10*3/uL (ref 0.0–0.2)
Basos: 2 %
EOS (ABSOLUTE): 0.3 10*3/uL (ref 0.0–0.4)
Eos: 4 %
Hematocrit: 38.9 % (ref 37.5–51.0)
Hemoglobin: 13.4 g/dL (ref 13.0–17.7)
Immature Grans (Abs): 0 10*3/uL (ref 0.0–0.1)
Immature Granulocytes: 1 %
Lymphocytes Absolute: 1.2 10*3/uL (ref 0.7–3.1)
Lymphs: 17 %
MCH: 34.1 pg — ABNORMAL HIGH (ref 26.6–33.0)
MCHC: 34.4 g/dL (ref 31.5–35.7)
MCV: 99 fL — ABNORMAL HIGH (ref 79–97)
Monocytes Absolute: 0.9 10*3/uL (ref 0.1–0.9)
Monocytes: 12 %
Neutrophils Absolute: 4.7 10*3/uL (ref 1.4–7.0)
Neutrophils: 64 %
Platelets: 183 10*3/uL (ref 150–450)
RBC: 3.93 x10E6/uL — ABNORMAL LOW (ref 4.14–5.80)
RDW: 13 % (ref 11.6–15.4)
WBC: 7.2 10*3/uL (ref 3.4–10.8)

## 2019-11-25 NOTE — Patient Instructions (Signed)
Ok to continue Loratidine daily for allergies. Use an allergy eye drop as well  Hydrate well.  You had labs performed today.  You will be contacted with the results of the labs once they are available, usually in the next 3 business days for routine lab work.  If you have an active my chart account, they will be released to your MyChart.  If you prefer to have these labs released to you via telephone, please let us know.  If you had a pap smear or biopsy performed, expect to be contacted in about 7-10 days.

## 2019-11-25 NOTE — Progress Notes (Signed)
Subjective: CC: f/u anxiety, HTN, HLD PCP: Janora Norlander, DO HYW:[id number] Tyler Diaz is a 74 y.o. male presenting to clinic today for:  1. GAD At last visit, he was started on Mirtazapine for anxiety and difficulty with sleep after failing Zoloft.  He was supposed to follow up 1 month after but cancelled appt. he notes that he discontinued the mirtazapine after 2 doses because it caused severe constipation.  He has been doing okay without it.  2. HTN, HLD Compliant with lisinopril, hydrochlorothiazide and Pravachol.  No chest pain, shortness of breath, loss of consciousness.  3.  Allergic conjunctivitis Patient reports watery itchy eyes that has been getting better with OTC loratadine.  He also uses an OTC allergy eyedrop.  He notes that his allergies seem to be a little bit worse then normal.  No cough, congestion.  ROS: Per HPI  Allergies  Allergen Reactions  . Shrimp [Shellfish Allergy] Hives, Nausea And Vomiting and Rash    "only shrimp" - crustaceans - blisters, n/v    Past Medical History:  Diagnosis Date  . Anemia    hx of  . Arthritis    hands/knees  . Blood clot in vein    RT ARM WITH PICC LINE   . C. difficile colitis 08/19/2013  . Colon cancer ([place]) 07/18/12 bx   rectum=Invasive adenocarcinoma w/ extracellular mucin,polyp=benign  . GERD (gastroesophageal reflux disease)    OCCASIONAL  . Hearing loss    partial greater on left  . Heart murmur   . Hematochezia    recent  . Herpes zoster - R flank - with severe pain 03/11/2013  . History of shingles   . Hyperlipidemia   . Hypertension   . Hypothyroidism as child   hx of  . Ileostomy - diverting loop - s/p takedown 06/28/2013 01/14/2013  . Mitral regurgitation due to partially flail posterior mitral valve leaflet   . Nocturia   . Numbness    TOES / FINGERS DUE TO CHEMO  . Peripheral vascular disease (HCC)    neuropathy due to cancer treatment (fingers and toes)  . Radiation    FOR COLON CANCER  . Rectal  cancer ([place])   . Rheumatic fever as child   hx  . Sinusitis    seasonal  . Tinnitus    left ear  . Umbilical hernia, incarcerated - s/p primary repair July 2014 01/03/2013    Current Outpatient Medications:  .  acetaminophen (TYLENOL) 500 MG tablet, Take 1,000 mg by mouth every 6 (six) hours as needed for headache. , Disp: , Rfl:  .  diclofenac sodium (VOLTAREN) 1 % GEL, Apply 4 g topically 4 (four) times daily. (for joint pain), Disp: 400 g, Rfl: 2 .  hydrochlorothiazide (HYDRODIURIL) 25 MG tablet, TAKE 1 TABLET BY MOUTH ONCE DAILY. STOPPING CHLORTHALIDONE, Disp: 90 tablet, Rfl: 0 .  hydrocortisone (PROCTO-MED HC) 2.5 % rectal cream, Place 1 application rectally 2 (two) times daily. x7-10 days prn hemorrhoid (may need to use brand for ins coverage) (Patient taking differently: Place 1 application rectally 2 (two) times daily as needed for hemorrhoids or anal itching. x7-10 days prn hemorrhoid (may need to use brand for ins coverage)), Disp: 30 g, Rfl: 0 .  ketoconazole (NIZORAL) 2 % cream, APPLY TOPICALLY TWICE DAILY (Patient taking differently: Apply 1 application topically 2 (two) times daily. ), Disp: 60 g, Rfl: 0 .  lisinopril (ZESTRIL) 40 MG tablet, Take 1 tablet by mouth once daily, Disp: 90 tablet, Rfl: 0 .  mirtazapine (REMERON) 7.5 MG tablet, Take 1 tablet (7.5 mg total) by mouth at bedtime for 14 days, THEN 2 tablets (15 mg total) at bedtime for 16 days., Disp: 45 tablet, Rfl: 0 .  Multiple Vitamin (MULTIVITAMIN WITH MINERALS) TABS, Take 1 tablet by mouth every morning. , Disp: , Rfl:  .  oxymetazoline (AFRIN) 0.05 % nasal spray, Place 1 spray into both nostrils 2 (two) times daily as needed for congestion., Disp: , Rfl:  .  pravastatin (PRAVACHOL) 20 MG tablet, Take 1 tablet (20 mg total) by mouth daily., Disp: 90 tablet, Rfl: 1 .  Probiotic Product (DIGESTIVE ADVANTAGE PO), Take 1 tablet by mouth daily., Disp: , Rfl:  .  psyllium (METAMUCIL) 58.6 % packet, Take 1 packet by mouth  daily., Disp: , Rfl:  .  triamcinolone ointment (KENALOG) 0.1 %, APPLY  OINTMENT TOPICALLY TO AFFECTED AREA THREE TIMES DAILY, Disp: 30 g, Rfl: 0 Social History   Socioeconomic History  . Marital status: Widowed    Spouse name: Not on file  . Number of children: 2  . Years of education: 10  . Highest education level: 10th grade  Occupational History  . 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 Smoking status: Former Smoker    Packs/day: 1.50    Years: 20.00    Pack years: 30.00    Types: Cigarettes    Quit date: 07/27/1972    Years since quitting: 47.3  . Smokeless tobacco: Never Used  Vaping Use  . Vaping Use: Never used  Substance and Sexual Activity  . Alcohol use: No  . Drug use: No  . Sexual activity: Not Currently  Other Topics Concern  . 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  Has two adult children and one grandchild. One son lives with him.    Social Determinants of Health   Financial Resource Strain:   . Difficulty of Paying Living Expenses:   Food Insecurity:   . Worried About Charity fundraiser in the Last Year:   . Arboriculturist in the Last Year:   Transportation Needs:   . Film/video editor (Medical):   Marland Kitchen Lack of Transportation (Non-Medical):   Physical Activity:   . Days of Exercise per Week:   . Minutes of Exercise per Session:   Stress:   . Feeling of Stress :   Social Connections:   . Frequency of Communication with Friends and Family:   . Frequency of Social Gatherings with Friends and Family:   . Attends Religious Services:   . Active Member of Clubs or Organizations:   . Attends Archivist Meetings:   Marland Kitchen Marital Status:   Intimate Partner Violence:   . Fear of Current or Ex-Partner:   . Emotionally Abused:   Marland Kitchen Physically Abused:   . Sexually Abused:    Family History  Problem Relation Age of Onset  . Cancer  Mother        abdominal ca ?ovarian?  . Heart disease Father   . Cancer Sister        liver cancer  . Heart disease Brother   . COPD Brother   . Healthy Son   . Healthy Son     Objective: Office vital signs reviewed. There were no vitals taken for this visit.  Physical Examination:  General: Awake, alert, well nourished,  No acute distress HEENT: Normal, sclera minimally injected.  He has quite a bit of puffiness of the eyelids bilaterally, MMM Cardio: regular rate and rhythm, S1S2 heard, stable 3/6 SEM at bilateral borders Pulm: clear to auscultation bilaterally, no wheezes, rhonchi or rales; normal work of breathing on room air Psych: Mood stable, speech normal, affect appropriate Depression screen [facility] 2/9 11/25/2019 09/04/2019 07/31/2019  Decreased Interest 0 1 0  Down, Depressed, Hopeless 0 1 0  PHQ - 2 Score 0 2 0  Altered sleeping 0 1 -  Tired, decreased energy 1 1 -  Change in appetite 0 0 -  Feeling bad or failure about yourself  1 1 -  Trouble concentrating 0 1 -  Moving slowly or fidgety/restless 0 0 -  Suicidal thoughts 0 0 -  PHQ-9 Score 2 6 -  Difficult doing work/chores Not difficult at all Somewhat difficult -  Some recent data might be hidden   Assessment/ Plan: 74 y.o. male   1. Anxiety Stable.  Not on medication - TSH  2. Essential hypertension Controlled.  Continue current regimen - CMP14+EGFR  3. Mixed hyperlipidemia Check fasting lipid panel - CMP14+EGFR - TSH - Lipid Panel  4. History of rectal cancer - CBC with Differential/Platelet  5. Allergic conjunctivitis of both eyes Continue OTC loratadine, allergy eyedrops.   No orders of the defined types were placed in this encounter.  No orders of the defined types were placed in this encounter.    Janora Norlander, DO [place] [phone]

## 2019-11-26 ENCOUNTER — Other Ambulatory Visit: Payer: Self-pay

## 2019-11-26 ENCOUNTER — Ambulatory Visit (INDEPENDENT_AMBULATORY_CARE_PROVIDER_SITE_OTHER): Payer: PPO | Admitting: *Deleted

## 2019-11-26 DIAGNOSIS — E860 Dehydration: Secondary | ICD-10-CM

## 2019-11-26 DIAGNOSIS — Z0000 Encounter for general adult medical examination without abnormal findings: Secondary | ICD-10-CM

## 2019-11-26 DIAGNOSIS — I10 Essential (primary) hypertension: Secondary | ICD-10-CM

## 2019-11-26 LAB — CMP14+EGFR
ALT: 27 IU/L (ref 0–44)
AST: 24 IU/L (ref 0–40)
Albumin/Globulin Ratio: 2.3 — ABNORMAL HIGH (ref 1.2–2.2)
Albumin: 4.9 g/dL — ABNORMAL HIGH (ref 3.7–4.7)
Alkaline Phosphatase: 50 IU/L (ref 48–121)
BUN/Creatinine Ratio: 39 — ABNORMAL HIGH (ref 10–24)
BUN: 59 mg/dL — ABNORMAL HIGH (ref 8–27)
Bilirubin Total: 1.4 mg/dL — ABNORMAL HIGH (ref 0.0–1.2)
CO2: 19 mmol/L — ABNORMAL LOW (ref 20–29)
Calcium: 10 mg/dL (ref 8.6–10.2)
Chloride: 103 mmol/L (ref 96–106)
Creatinine, Ser: 1.53 mg/dL — ABNORMAL HIGH (ref 0.76–1.27)
GFR calc Af Amer: 50 mL/min/{1.73_m2} — ABNORMAL LOW (ref >59)
GFR calc non Af Amer: 44 mL/min/{1.73_m2} — ABNORMAL LOW (ref >59)
Globulin, Total: 2.1 g/dL (ref 1.5–4.5)
Glucose: 98 mg/dL (ref 65–99)
Potassium: 5.4 mmol/L — ABNORMAL HIGH (ref 3.5–5.2)
Sodium: 137 mmol/L (ref 134–144)
Total Protein: 7 g/dL (ref 6.0–8.5)

## 2019-11-26 LAB — LIPID PANEL
Chol/HDL Ratio: 4 ratio (ref 0.0–5.0)
Cholesterol, Total: 140 mg/dL (ref 100–199)
HDL: 35 mg/dL — ABNORMAL LOW (ref >39)
LDL Chol Calc (NIH): 77 mg/dL (ref 0–99)
Triglycerides: 163 mg/dL — ABNORMAL HIGH (ref 0–149)
VLDL Cholesterol Cal: 28 mg/dL (ref 5–40)

## 2019-11-26 LAB — TSH: TSH: 3.48 u[IU]/mL (ref 0.450–4.500)

## 2019-11-26 NOTE — Progress Notes (Signed)
MEDICARE ANNUAL WELLNESS VISIT  11/26/2019  Telephone Visit Disclaimer This Medicare AWV was conducted by telephone due to national recommendations for restrictions regarding the COVID-19 Pandemic (e.g. social distancing).  I verified, using two identifiers, that I am speaking with Tyler Diaz or their authorized healthcare agent. I discussed the limitations, risks, security, and privacy concerns of performing an evaluation and management service by telephone and the potential availability of an in-person appointment in the future. The patient expressed understanding and agreed to proceed.   Subjective:  Tyler Diaz is a 74 y.o. male patient of Tyler Norlander, DO who had a Medicare Annual Wellness Visit today via telephone. Tyler Diaz is Retired and lives with their son. he has 2 children. he reports that he is not socially active and does interact with friends/family regularly. he is not physically active and enjoys yard [employer] techn[employer] markets.  Patient Care Team: Tyler Norlander, DO as PCP - General (Family Medicine) Harl Bowie Alphonse Guild, MD as PCP - Cardiology (Cardiology) Michael Boston, MD as Consulting Physician (Colon and Rectal Surgery) Wilford Corner, MD as Consulting Physician (Gastroenterology) [name], MD as Consulting Physician (Oncology) Harlen Labs, MD as Referring Physician (Optometry)  Advanced Directives 11/26/2019 07/12/2019 07/11/2019 11/19/2018 11/13/2017 11/13/2017 11/11/2016  Does Patient Have a Medical Advance Directive? No - Yes No (No Data) No No  Type of Advance Directive - - Living will - - - -  Does patient want to make changes to medical advance directive? - - No - Patient declined - - - -  Would patient like information on creating a medical advance directive? No - Patient declined No - Patient declined No - Patient declined No - Patient declined - No - Patient declined Yes (MAU/Ambulatory/Procedural Areas - Information given)  Pre-existing  out of facility DNR order (yellow form or pink MOST form) - - - - - - -    Hospital Utilization Over the Past 12 Months: # of hospitalizations or ER visits: 1# of surgeries: 0  Review of Systems    Patient reports that his overall health is worse compared to last year.  History obtained from chart review and the patient  Patient Reported Readings (BP, Pulse, CBG, Weight, etc) none  Pain Assessment Pain : No/denies pain     Current Medications & Allergies (verified) Allergies as of 11/26/2019      Reactions   Shrimp [shellfish Allergy] Hives, Nausea And Vomiting, Rash   "only shrimp" - crustaceans - blisters, n/v       Medication List       Accurate as of November 26, 2019  9:50 AM. If you have any questions, ask your nurse or doctor.        acetaminophen 500 MG tablet Commonly known as: TYLENOL Take 1,000 mg by mouth every 6 (six) hours as needed for headache.   diclofenac sodium 1 % Gel Commonly known as: VOLTAREN Apply 4 g topically 4 (four) times daily. (for joint pain)   DIGESTIVE ADVANTAGE PO Take 1 tablet by mouth daily.   hydrochlorothiazide 25 MG tablet Commonly known as: HYDRODIURIL TAKE 1 TABLET BY MOUTH ONCE DAILY. STOPPING CHLORTHALIDONE   hydrocortisone 2.5 % rectal cream Commonly known as: Procto-Med HC Place 1 application rectally 2 (two) times daily. x7-10 days prn hemorrhoid (may need to use brand for ins coverage) What changed:   when to take this  reasons to take this   ketoconazole 2 % cream Commonly known  as: NIZORAL APPLY TOPICALLY TWICE DAILY What changed: how much to take   lisinopril 40 MG tablet Commonly known as: ZESTRIL Take 1 tablet by mouth once daily   multivitamin with minerals Tabs tablet Take 1 tablet by mouth every morning.   oxymetazoline 0.05 % nasal spray Commonly known as: AFRIN Place 1 spray into both nostrils 2 (two) times daily as needed for congestion.   pravastatin 20 MG tablet Commonly known as:  PRAVACHOL Take 1 tablet (20 mg total) by mouth daily.   psyllium 58.6 % packet Commonly known as: METAMUCIL Take 1 packet by mouth daily.   triamcinolone ointment 0.1 % Commonly known as: KENALOG APPLY  OINTMENT TOPICALLY TO AFFECTED AREA THREE TIMES DAILY       History (reviewed): Past Medical History:  Diagnosis Date  . Anemia    hx of  . Arthritis    hands/knees  . Blood clot in vein    RT ARM WITH PICC LINE   . C. difficile colitis 08/19/2013  . Colon cancer ([place]) 07/18/12 bx   rectum=Invasive adenocarcinoma w/ extracellular mucin,polyp=benign  . GERD (gastroesophageal reflux disease)    OCCASIONAL  . Hearing loss    partial greater on left  . Heart murmur   . Hematochezia    recent  . Herpes zoster - R flank - with severe pain 03/11/2013  . History of shingles   . Hyperlipidemia   . Hypertension   . Hypothyroidism as child   hx of  . Ileostomy - diverting loop - s/p takedown 06/28/2013 01/14/2013  . Mitral regurgitation due to partially flail posterior mitral valve leaflet   . Nocturia   . Numbness    TOES / FINGERS DUE TO CHEMO  . Peripheral vascular disease (HCC)    neuropathy due to cancer treatment (fingers and toes)  . Radiation    FOR COLON CANCER  . Rectal cancer ([place])   . Rheumatic fever as child   hx  . Sinusitis    seasonal  . Tinnitus    left ear  . Umbilical hernia, incarcerated - s/p primary repair July 2014 01/03/2013   Past Surgical History:  Procedure Laterality Date  . CARPAL TUNNEL RELEASE Right 2002  . ILEOSTOMY N/A 12/27/2012   Procedure: DIVERTING LOOP ILEOSTOMY;  Surgeon: Adin Hector, MD;  Location: [facility];  Service: General;  Laterality: N/A;  . ILEOSTOMY CLOSURE N/A 06/28/2013   Procedure: loop ILEOSTOMY TAKEDOWN examination of anesthesia ;  Surgeon: Adin Hector, MD;  Location: [facility];  Service: General;  Laterality: N/A;  . INGUINAL HERNIA REPAIR Left   . INGUINAL HERNIA REPAIR Bilateral 08/13/2015   Procedure: LAPAROSCOPIC  BILATERAL INGUINAL HERNIA REPAIR WITH MESH;  Surgeon: Michael Boston, MD;  Location: [facility];  Service: General;  Laterality: Bilateral;  . INSERTION OF MESH Bilateral 08/13/2015   Procedure: INSERTION OF MESH;  Surgeon: Michael Boston, MD;  Location: [facility];  Service: General;  Laterality: Bilateral;  . LAPAROSCOPIC LOW ANTERIOR RESCECTION WITH COLOANAL ANASTOMOSIS N/A 12/27/2012   Procedure: LAPAROSCOPIC LOW ANTERIOR RESCECTION WITH COLOANAL ANASTOMOSIS;  Surgeon: Adin Hector, MD;  Location: [facility];  Service: General;  Laterality: N/A;  . LAPAROSCOPIC LOW ANTERIOR RESECTION N/A 12/27/2012   Procedure: LAPAROSCOPIC LOW ANTERIOR RESECTION, COLO-ANAL ANASTOMOSIS, DIVERTING LOOP ILEOSTOMY,SPLENIC FLEXURE MOBILIZATION, PRIMARY INCARCERATED UMBILICAL HERNIA REPAIR;  Surgeon: Adin Hector, MD;  Location: [facility];  Service: General;  Laterality: N/A;  . TEE WITHOUT CARDIOVERSION N/A 07/10/2013   Procedure: TRANSESOPHAGEAL ECHOCARDIOGRAM (TEE);  Surgeon: Candee Furbish, MD;  Location: [facility] ENDOSCOPY;  Service: Cardiovascular;  Laterality: N/A;  . TEE WITHOUT CARDIOVERSION N/A 08/21/2013   Procedure: TRANSESOPHAGEAL ECHOCARDIOGRAM (TEE);  Surgeon: Sanda Klein, MD;  Location[facility] ENDOSCOPY;  Service: Cardiovascular;  Laterality: N/A;  . TEE WITHOUT CARDIOVERSION N/A 07/16/2019   Procedure: TRANSESOPHAGEAL ECHOCARDIOGRAM (TEE) WITH PROPOFOL;  Surgeon: Arnoldo Lenis, MD;  Location: [facility];  Service: Endoscopy;  Laterality: N/A;  . TONSILLECTOMY     as child  . UMBILICAL HERNIA REPAIR  12/27/2012   Procedure: PRIMARY REPAIR INCARCERATED UMBILICAL HERNIA;  Surgeon: Adin Hector, MD;  Location: [facility];  Service: General;;   Family History  Problem Relation Age of Onset  . Cancer Mother        abdominal ca ?ovarian?  . Heart disease Father   . Cancer Sister        liver cancer  . Heart disease Brother   . COPD Brother   . Healthy Son   . Healthy Son    Social History   Socioeconomic History  . Marital  status: Widowed    Spouse name: Not on file  . Number of children: 2  . Years of education: 9  . Highest education level: 9th grade  Occupational History  . 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 Smoking status: Former Smoker    Packs/day: 1.50    Years: 20.00    Pack years: 30.00    Types: Cigarettes    Quit date: 07/27/1972    Years since quitting: 47.3  . Smokeless tobacco: Never Used  Vaping Use  . Vaping Use: Never used  Substance and Sexual Activity  . Alcohol use: No  . Drug use: No  . Sexual activity: Not Currently  Other Topics Concern  . 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  Has two adult children and one grandchild. One son lives with him.    Social Determinants of Health   Financial Resource Strain:   . Difficulty of Paying Living Expenses:   Food Insecurity: No Food Insecurity  . Worried About Charity fundraiser in the Last Year: Never true  . Ran Out of Food in the Last Year: Never true  Transportation Needs: No Transportation Needs  . Lack of Transportation (Medical): No  . Lack of Transportation (Non-Medical): No  Physical Activity: Inactive  . Days of Exercise per Week: 0 days  . Minutes of Exercise per Session: 0 min  Stress: No Stress Concern Present  . Feeling of Stress : Only a little  Social Connections: Moderately Isolated  . Frequency of Communication with Friends and Family: More than three times a week  . Frequency of Social Gatherings with Friends and Family: More than three times a week  . Attends Religious Services: 1 to 4 times per year  . Active Member of Clubs or Organizations: No  . Attends Archivist Meetings: Never  . Marital Status: Widowed    Activities of Daily Living In your present state of health, do you have any difficulty performing the following activities: 11/26/2019 07/12/2019  Hearing? N Y    Vision? Y N  Comment wears glasses -  Difficulty concentrating or making decisions? N N  Walking or climbing stairs? N Y  Dressing or bathing? N N  Doing errands, shopping? N N  Conservation officer, nature and  eating ? N -  Using the Toilet? N -  In the past six months, have you accidently leaked urine? N -  Do you have problems with loss of bowel control? N -  Managing your Medications? N -  Managing your Finances? N -  Housekeeping or managing your Housekeeping? N -  Some recent data might be hidden    Patient Education/ Literacy How often do you need to have someone help you when you read instructions, pamphlets, or other written materials from your doctor or pharmacy?: 2 - Rarely What is the last grade level you completed in school?: 9  Exercise Current Exercise Habits: The patient does not participate in regular exercise at present, Exercise limited by: None identified  Diet Patient reports consuming 2 meals a day and 3 snack(s) a day Patient reports that his primary diet is: Regular Patient reports that she does have regular access to food.   Depression Screen PHQ 2/9 Scores 11/26/2019 11/25/2019 09/04/2019 07/31/2019 07/24/2019 05/24/2019 11/21/2018  PHQ - 2 Score 0 0 2 0 4 1 0  PHQ- 9 Score - 2 6 - 11 2 0     Fall Risk Fall Risk  11/26/2019 11/25/2019 09/04/2019 07/31/2019 07/24/2019  Falls in the past year? 0 0 0 0 0  Risk for fall due to : - - - Impaired balance/gait;Impaired mobility -  Follow up - - - Falls evaluation completed -     Objective:  Tyler Diaz seemed alert and oriented and he participated appropriately during our telephone visit.  Blood Pressure Weight BMI  BP Readings from Last 3 Encounters:  09/24/19 (!) 155/88  09/04/19 137/79  08/12/19 126/82   Wt Readings from Last 3 Encounters:  09/24/19 181 lb (82.1 kg)  09/04/19 182 lb (82.6 kg)  08/12/19 182 lb 12.8 oz (82.9 kg)   BMI Readings from Last 1 Encounters:  09/24/19 25.97 kg/m    *Unable to obtain  current vital signs, weight, and BMI due to telephone visit type  Hearing/Vision  . Kealii did not seem to have difficulty with hearing/understanding during the telephone conversation . Reports that he has had a formal eye exam by an eye care professional within the past year . Reports that he has not had a formal hearing evaluation within the past year *Unable to fully assess hearing and vision during telephone visit type  Cognitive Function: 6CIT Screen 11/26/2019 11/19/2018  What Year? 0 points 0 points  What month? 0 points 0 points  What time? 0 points 0 points  Count back from 20 0 points 0 points  Months in reverse 2 points 0 points  Repeat phrase 0 points 0 points  Total Score 2 0   (Normal:0-7, Significant for Dysfunction: >8)  Normal Cognitive Function Screening: Yes   Immunization & Health Maintenance Record Immunization History  Administered Date(s) Administered  . Fluad Quad(high Dose 65+) 03/21/2019  . Influenza Inj Mdck Quad Pf 03/21/2019  . Influenza, High Dose Seasonal PF 03/16/2018  . Influenza,inj,Quad PF,6+ Mos 05/21/2013, 03/13/2014, 03/10/2015, 03/14/2016, 03/08/2017  . Pneumococcal Polysaccharide-23 05/21/2013    Health Maintenance  Topic Date Due  . COVID-19 Vaccine (1) Never done  . TETANUS/TDAP  05/23/2020 (Originally 09/11/1964)  . INFLUENZA VACCINE  01/05/2020  . Hepatitis C Screening  Completed  . PNA vac Low Risk Adult  Completed       Assessment  This is a routine wellness examination for Tyler Diaz.  Health Maintenance: Due or Overdue Health Maintenance Due  Topic Date Due  . COVID-19 Vaccine (1) Never done    Tyler Diaz does not need a referral for Community Assistance: Care Management:   no Social Work:    no Prescription Assistance:  no Nutrition/Diabetes Education:  no   Plan:  Personalized Goals Goals Addressed            This Visit's Progress   . DIET - REDUCE FAST FOOD INTAKE       Eat more meals at home with  son    . Exercise 150 min/wk Moderate Activity   Not on track   . Prevent falls   On track    Move carefully to avoid falls      Personalized Health Maintenance & Screening Recommendations  shingles vaccine  Lung Cancer Screening Recommended: no (Low Dose CT Chest recommended if Age 76-80 years, 30 pack-year currently smoking OR have quit w/in past 15 years) Hepatitis C Screening recommended: no HIV Screening recommended: no  Advanced Directives: Written information was not prepared per patient's request.  Referrals & Orders No orders of the defined types were placed in this encounter.   Follow-up Plan . Follow-up with Tyler Norlander, DO as planned on 05/26/20 . Pt continues to be independent with all ADL's. . 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 . Pt denies hearing difficulties . Wears glasses and has annual vision exams with Dr. Marin Comment . Pts goals are to prevent falls and cut back on fast food. . Pt is up to date on all health maintenance, except shingles vaccine. Will offer at next visit. . Pt does not have advanced directives in place and chooses not to at this time. . Pt voices no healthcare concerns. . AVS printed and placed up front for patient to pick up   I have personally reviewed and noted the following in the patient's chart:   . Medical and social history . Use of alcohol, tobacco or illicit drugs  . Current medications and supplements . Functional ability and status . Nutritional status . Physical activity . Advanced directives . List of other physicians . Hospitalizations, surgeries, and ER visits in previous 12 months . Vitals . Screenings to include cognitive, depression, and falls . Referrals and appointments  In addition, I have reviewed and discussed with Tyler Diaz certain preventive protocols, quality metrics, and best practice recommendations. A written personalized care plan for preventive services as well as general  preventive health recommendations is available and can be mailed to the patient at his request.      Rana Snare, LPN  09/02/760

## 2019-12-03 ENCOUNTER — Other Ambulatory Visit: Payer: Self-pay

## 2019-12-03 ENCOUNTER — Other Ambulatory Visit: Payer: PPO

## 2019-12-03 DIAGNOSIS — I10 Essential (primary) hypertension: Secondary | ICD-10-CM

## 2019-12-03 DIAGNOSIS — E860 Dehydration: Secondary | ICD-10-CM | POA: Diagnosis not present

## 2019-12-03 LAB — BMP8+EGFR
BUN/Creatinine Ratio: 15 (ref 10–24)
BUN: 17 mg/dL (ref 8–27)
CO2: 23 mmol/L (ref 20–29)
Calcium: 9.1 mg/dL (ref 8.6–10.2)
Chloride: 104 mmol/L (ref 96–106)
Creatinine, Ser: 1.11 mg/dL (ref 0.76–1.27)
GFR calc Af Amer: 74 mL/min/{1.73_m2} (ref >59)
GFR calc non Af Amer: 64 mL/min/{1.73_m2} (ref >59)
Glucose: 97 mg/dL (ref 65–99)
Potassium: 4.4 mmol/L (ref 3.5–5.2)
Sodium: 141 mmol/L (ref 134–144)

## 2019-12-05 ENCOUNTER — Telehealth: Payer: Self-pay | Admitting: Family Medicine

## 2019-12-05 NOTE — Telephone Encounter (Signed)
Called patient and gave results - see note in chart

## 2019-12-15 ENCOUNTER — Other Ambulatory Visit: Payer: Self-pay | Admitting: Cardiology

## 2019-12-16 ENCOUNTER — Telehealth: Payer: Self-pay | Admitting: Family Medicine

## 2019-12-16 ENCOUNTER — Ambulatory Visit (INDEPENDENT_AMBULATORY_CARE_PROVIDER_SITE_OTHER): Payer: PPO | Admitting: Physician Assistant

## 2019-12-16 ENCOUNTER — Encounter: Payer: Self-pay | Admitting: Physician Assistant

## 2019-12-16 ENCOUNTER — Other Ambulatory Visit: Payer: Self-pay

## 2019-12-16 VITALS — BP 146/77 | HR 77 | Temp 97.4°F | Ht 70.0 in | Wt 186.0 lb

## 2019-12-16 DIAGNOSIS — H10023 Other mucopurulent conjunctivitis, bilateral: Secondary | ICD-10-CM | POA: Diagnosis not present

## 2019-12-16 MED ORDER — TOBRAMYCIN 0.3 % OP SOLN: 2.0000 [drp] | Freq: Four times a day (QID) | OPHTHALMIC | 0 refills | Status: DC

## 2019-12-16 NOTE — Patient Instructions (Signed)

## 2019-12-16 NOTE — Telephone Encounter (Signed)
Patient with complaints of facial swelling. States he woke up like that. Applying ice but not helping. Appt given for today and patient verbalized understanding

## 2019-12-16 NOTE — Progress Notes (Signed)
  Subjective:     Patient ID: Tyler Diaz, male   DOB: [birth year], 74 y.o.   MRN: [id number]  HPI Seen last week due to allergy sx to the eyes Recommended cool compresses and to f/u prn Now having matting to bot eyes with continued redness and swelling Denies any eye pain or change in vision  Review of Systems  Eyes: Positive for discharge and redness. Negative for photophobia, pain, itching and visual disturbance.       Objective:   Physical Exam Vitals and nursing note reviewed.  Constitutional:      General: He is not in acute distress.    Appearance: He is not toxic-appearing.  Neurological:     Mental Status: He is alert.   PERRLA EOMI + sl erythema to the conjunct bilat + thickened drainage bilat Sl periorbital erythema Sl upper lid edema to the L eye No preaurc nodes palp     Assessment:     1. Other mucopurulent conjunctivitis of both eyes         Plan:     Cool compresses Discussed allergy vs possible bacterial infection Due to the new drainage Tobrex rx F/U in 1 week for further eval

## 2019-12-19 ENCOUNTER — Ambulatory Visit (INDEPENDENT_AMBULATORY_CARE_PROVIDER_SITE_OTHER): Payer: PPO | Admitting: Physician Assistant

## 2019-12-19 ENCOUNTER — Other Ambulatory Visit: Payer: Self-pay

## 2019-12-19 ENCOUNTER — Encounter: Payer: Self-pay | Admitting: Physician Assistant

## 2019-12-19 VITALS — BP 129/79 | HR 78 | Temp 97.4°F | Resp 20 | Ht 70.0 in | Wt 183.0 lb

## 2019-12-19 DIAGNOSIS — H1033 Unspecified acute conjunctivitis, bilateral: Secondary | ICD-10-CM | POA: Diagnosis not present

## 2019-12-19 NOTE — Patient Instructions (Signed)
Allergic Conjunctivitis, Adult  Allergic conjunctivitis is inflammation of the clear membrane that covers the white part of your eye and the inner surface of your eyelid (conjunctiva). The inflammation is caused by allergies. The blood vessels in the conjunctiva become inflamed and this causes the eyes to become red or pink. The eyes often feel itchy. Allergic conjunctivitis cannot be spread from one person to another person (is not contagious). What are the causes? This condition is caused by an allergic reaction. Common causes of an allergic reaction (allergens) include:  Outdoor allergens, such as: ? Pollen. ? Grass and weeds. ? Mold spores.  Indoor allergens, such as: ? Dust. ? Smoke. ? Mold. ? Pet dander. ? Animal hair. What increases the risk? You may be more likely to develop this condition if you have a family history of allergies, such as:  Allergic rhinitis.  Bronchial asthma.  Atopic dermatitis. What are the signs or symptoms? Symptoms of this condition include eyes that are:  Itchy.  Red.  Watery.  Puffy. Your eyes may also:  Sting or burn.  Have clear drainage coming from them. How is this diagnosed? This condition may be diagnosed by medical history and physical exam. If you have drainage from your eyes, it may be tested to rule out other causes of conjunctivitis. You may also need to see a health care provider who specializes in treating allergies (allergist) or eye conditions (ophthalmologist) for tests to confirm the diagnosis. You may have:  Skin tests to see which allergens are causing your symptoms. These tests involve pricking the skin with a tiny needle and exposing the skin to small amounts of potential allergens to see if your skin reacts.  Blood tests.  Tissue scrapings from your eyelid. These will be examined under a microscope. How is this treated? Treatments for this condition may include:  Cold cloths (compresses) to soothe itching and  swelling.  Washing the face to remove allergens.  Eye drops. These may be prescription or over-the-counter. There are several different types. You may need to try different types to see which one works best for you. Your may need: ? Eye drops that block the allergic reaction (antihistamine). ? Eye drops that reduce swelling and irritation (anti-inflammatory). ? Steroid eye drops to lessen a severe reaction (vernal conjunctivitis).  Oral antihistamine medicines to reduce your allergic reaction. You may need these if eye drops do not help or are difficult to use. Follow these instructions at home:  Avoid known allergens whenever possible.  Take or apply over-the-counter and prescription medicines only as told by your health care provider. These include any eye drops.  Apply a cool, clean washcloth to your eye for 10-20 minutes, 3-4 times a day.  Do not touch or rub your eyes.  Do not wear contact lenses until the inflammation is gone. Wear glasses instead.  Do not wear eye makeup until the inflammation is gone.  Keep all follow-up visits as told by your health care provider. This is important. Contact a health care provider if:  Your symptoms get worse or do not improve with treatment.  You have mild eye pain.  You have sensitivity to light.  You have spots or blisters on your eyes.  You have pus draining from your eye.  You have a fever. Get help right away if:  You have redness, swelling, or other symptoms in only one eye.  Your vision is blurred or you have vision changes.  You have severe eye pain. This information   is not intended to replace advice given to you by your health care provider. Make sure you discuss any questions you have with your health care provider. Document Revised: 05/05/2017 Document Reviewed: 12/04/2015 Elsevier Patient Education  2020 Elsevier Inc.  

## 2019-12-19 NOTE — Progress Notes (Signed)
  Subjective:     Patient ID: Tyler Diaz, male   DOB: [birth year], 74 y.o.   MRN: [id number]  HPI Pt here for f/u of his conjunctivitis States sx have improved Swelling has improved Has noted some scaling below the eye and wants to make sure ok  Review of Systems  Eyes: Negative.        Objective:   Physical Exam Vitals and nursing note reviewed.  Constitutional:      Appearance: Normal appearance.  Eyes:     Extraocular Movements: Extraocular movements intact.     Conjunctiva/sclera: Conjunctivae normal.     Pupils: Pupils are equal, round, and reactive to light.  Neurological:     Mental Status: He is alert.   Sig improvement in the periorbital edema/erythema Sl scaling noted below the eye  No edema/erythema     Assessment:     1. Acute bacterial conjunctivitis of both eyes        Plan:     Discussed scaling due to the haling after the edema Finish ATB drops Cool compresses F/U prn

## 2019-12-23 ENCOUNTER — Ambulatory Visit: Payer: PPO | Admitting: Nurse Practitioner

## 2019-12-29 ENCOUNTER — Other Ambulatory Visit: Payer: Self-pay | Admitting: Family Medicine

## 2019-12-29 DIAGNOSIS — I10 Essential (primary) hypertension: Secondary | ICD-10-CM

## 2020-02-13 ENCOUNTER — Other Ambulatory Visit: Payer: Self-pay | Admitting: Family Medicine

## 2020-02-13 DIAGNOSIS — E785 Hyperlipidemia, unspecified: Secondary | ICD-10-CM

## 2020-02-18 ENCOUNTER — Ambulatory Visit: Payer: PPO | Admitting: Cardiology

## 2020-03-04 ENCOUNTER — Ambulatory Visit: Payer: PPO | Admitting: Cardiology

## 2020-03-25 ENCOUNTER — Ambulatory Visit: Payer: PPO | Admitting: Cardiology

## 2020-03-25 ENCOUNTER — Other Ambulatory Visit: Payer: Self-pay

## 2020-03-25 ENCOUNTER — Encounter: Payer: Self-pay | Admitting: Cardiology

## 2020-03-25 ENCOUNTER — Ambulatory Visit (INDEPENDENT_AMBULATORY_CARE_PROVIDER_SITE_OTHER): Payer: PPO

## 2020-03-25 VITALS — BP 128/80 | HR 75 | Ht 70.0 in | Wt 187.2 lb

## 2020-03-25 DIAGNOSIS — I340 Nonrheumatic mitral (valve) insufficiency: Secondary | ICD-10-CM

## 2020-03-25 DIAGNOSIS — I10 Essential (primary) hypertension: Secondary | ICD-10-CM | POA: Diagnosis not present

## 2020-03-25 DIAGNOSIS — E782 Mixed hyperlipidemia: Secondary | ICD-10-CM

## 2020-03-25 DIAGNOSIS — Z23 Encounter for immunization: Secondary | ICD-10-CM

## 2020-03-25 NOTE — Patient Instructions (Signed)
Medication Instructions:  Continue all current medications.   Labwork: none  Testing/Procedures: none  Follow-Up: 6 months   Any Other Special Instructions Will Be Listed Below (If Applicable).   If you need a refill on your cardiac medications before your next appointment, please call your pharmacy.  

## 2020-03-25 NOTE — Progress Notes (Signed)
Clinical Summary Tyler Diaz is a 74 y.o.male seen today for follow up of the following medical problems.   1. Mitral regurgitation -11/2017 echo LVEF 60%, moderate to severe MR. Partial flail cusp of posterior leaflet. LVIDs 3.6 - 08/2013 TEE flail segment of P2 scallop posterior leaflet due to ruptured chordae, moderate to severe MR  03/2019 TTE There is posterior leaflet prolapse and  a calcified density on the atrial side of the posterior leaflet that could  represent a partial flail subsegment of the posterior leaflet or perhaps  an old vegetation. There is  eccentric, anteriorly directed, moderate to severe mitral valve  Regurgitation   07/2019 TEE: Ruptured chordae attached to the posterior leaflet with  partial prolapse of P2. Chronic finding when compared to 07/2013 TEE. There  is eccentric anterior directed MR thats difficult to quantify due to  eccentricity, appears moderate to  severe.    - no SOB/DOE, no LE edema   2. HTN - compliant with meds  3. Hyperlipidemia -6/2021TC 140 TG 163 HDL 35 LDL 77  compliant with statin  4. Colorectal cancer - s/p surgery,currently cancer free.   5. Recent Sepsis/Bacteremia - recent admission 07/2019 with sepsis and staph aureus bacteremia - TEE without signs of endocarditis  Has not had covid vaccine.  Past Medical History:  Diagnosis Date  . Anemia    hx of  . Arthritis    hands/knees  . Blood clot in vein    RT ARM WITH PICC LINE   . C. difficile colitis 08/19/2013  . Colon cancer ([place]) 07/18/12 bx   rectum=Invasive adenocarcinoma w/ extracellular mucin,polyp=benign  . GERD (gastroesophageal reflux disease)    OCCASIONAL  . Hearing loss    partial greater on left  . Heart murmur   . Hematochezia    recent  . Herpes zoster - R flank - with severe pain 03/11/2013  . History of shingles   . Hyperlipidemia   . Hypertension   . Hypothyroidism as child   hx of  . Ileostomy - diverting loop -  s/p takedown 06/28/2013 01/14/2013  . Mitral regurgitation due to partially flail posterior mitral valve leaflet   . Nocturia   . Numbness    TOES / FINGERS DUE TO CHEMO  . Peripheral vascular disease (HCC)    neuropathy due to cancer treatment (fingers and toes)  . Radiation    FOR COLON CANCER  . Rectal cancer ([place])   . Rheumatic fever as child   hx  . Sinusitis    seasonal  . Tinnitus    left ear  . Umbilical hernia, incarcerated - s/p primary repair July 2014 01/03/2013     Allergies  Allergen Reactions  . Shrimp [Shellfish Allergy] Hives, Nausea And Vomiting and Rash    "only shrimp" - crustaceans - blisters, n/v      Current Outpatient Medications  Medication Sig Dispense Refill  . acetaminophen (TYLENOL) 500 MG tablet Take 1,000 mg by mouth every 6 (six) hours as needed for headache.     . diclofenac sodium (VOLTAREN) 1 % GEL Apply 4 g topically 4 (four) times daily. (for joint pain) 400 g 2  . hydrochlorothiazide (HYDRODIURIL) 25 MG tablet TAKE 1 TABLET BY MOUTH ONCE DAILY. STOPPING CHLORTHALIDONE 90 tablet 1  . hydrocortisone (PROCTO-MED HC) 2.5 % rectal cream Place 1 application rectally 2 (two) times daily. x7-10 days prn hemorrhoid (may need to use brand for ins coverage) (Patient taking differently: Place 1 application rectally  2 (two) times daily as needed for hemorrhoids or anal itching. x7-10 days prn hemorrhoid (may need to use brand for ins coverage)) 30 g 0  . ketoconazole (NIZORAL) 2 % cream APPLY TOPICALLY TWICE DAILY (Patient taking differently: Apply 1 application topically 2 (two) times daily. ) 60 g 0  . lisinopril (ZESTRIL) 40 MG tablet Take 1 tablet by mouth once daily 90 tablet 1  . Multiple Vitamin (MULTIVITAMIN WITH MINERALS) TABS Take 1 tablet by mouth every morning.     Marland Kitchen oxymetazoline (AFRIN) 0.05 % nasal spray Place 1 spray into both nostrils 2 (two) times daily as needed for congestion.    . pravastatin (PRAVACHOL) 20 MG tablet Take 1 tablet by  mouth once daily 90 tablet 0  . Probiotic Product (DIGESTIVE ADVANTAGE PO) Take 1 tablet by mouth daily.    . psyllium (METAMUCIL) 58.6 % packet Take 1 packet by mouth daily.    Marland Kitchen tobramycin (TOBREX) 0.3 % ophthalmic solution Place 2 drops into both eyes every 6 (six) hours. 5 mL 0  . triamcinolone ointment (KENALOG) 0.1 % APPLY  OINTMENT TOPICALLY TO AFFECTED AREA THREE TIMES DAILY 30 g 0   No current facility-administered medications for this visit.     Past Surgical History:  Procedure Laterality Date  . CARPAL TUNNEL RELEASE Right 2002  . ILEOSTOMY N/A 12/27/2012   Procedure: DIVERTING LOOP ILEOSTOMY;  Surgeon: Adin Hector, MD;  Location: [facility];  Service: General;  Laterality: N/A;  . ILEOSTOMY CLOSURE N/A 06/28/2013   Procedure: loop ILEOSTOMY TAKEDOWN examination of anesthesia ;  Surgeon: Adin Hector, MD;  Location: [facility];  Service: General;  Laterality: N/A;  . INGUINAL HERNIA REPAIR Left   . INGUINAL HERNIA REPAIR Bilateral 08/13/2015   Procedure: LAPAROSCOPIC BILATERAL INGUINAL HERNIA REPAIR WITH MESH;  Surgeon: Michael Boston, MD;  Location: [facility];  Service: General;  Laterality: Bilateral;  . INSERTION OF MESH Bilateral 08/13/2015   Procedure: INSERTION OF MESH;  Surgeon: Michael Boston, MD;  Location: [facility];  Service: General;  Laterality: Bilateral;  . LAPAROSCOPIC LOW ANTERIOR RESCECTION WITH COLOANAL ANASTOMOSIS N/A 12/27/2012   Procedure: LAPAROSCOPIC LOW ANTERIOR RESCECTION WITH COLOANAL ANASTOMOSIS;  Surgeon: Adin Hector, MD;  Location: [facility];  Service: General;  Laterality: N/A;  . LAPAROSCOPIC LOW ANTERIOR RESECTION N/A 12/27/2012   Procedure: LAPAROSCOPIC LOW ANTERIOR RESECTION, COLO-ANAL ANASTOMOSIS, DIVERTING LOOP ILEOSTOMY,SPLENIC FLEXURE MOBILIZATION, PRIMARY INCARCERATED UMBILICAL HERNIA REPAIR;  Surgeon: Adin Hector, MD;  Location: [facility];  Service: General;  Laterality: N/A;  . TEE WITHOUT CARDIOVERSION N/A 07/10/2013   Procedure: TRANSESOPHAGEAL  ECHOCARDIOGRAM (TEE);  Surgeon: Candee Furbish, MD;  Location: [facility] ENDOSCOPY;  Service: Cardiovascular;  Laterality: N/A;  . TEE WITHOUT CARDIOVERSION N/A 08/21/2013   Procedure: TRANSESOPHAGEAL ECHOCARDIOGRAM (TEE);  Surgeon: Sanda Klein, MD;  Location: [facility] ENDOSCOPY;  Service: Cardiovascular;  Laterality: N/A;  . TEE WITHOUT CARDIOVERSION N/A 07/16/2019   Procedure: TRANSESOPHAGEAL ECHOCARDIOGRAM (TEE) WITH PROPOFOL;  Surgeon: Arnoldo Lenis, MD;  Location: [facility];  Service: Endoscopy;  Laterality: N/A;  . TONSILLECTOMY     as child  . UMBILICAL HERNIA REPAIR  12/27/2012   Procedure: PRIMARY REPAIR INCARCERATED UMBILICAL HERNIA;  Surgeon: Adin Hector, MD;  Location: [facility];  Service: General;;     Allergies  Allergen Reactions  . Shrimp [Shellfish Allergy] Hives, Nausea And Vomiting and Rash    "only shrimp" - crustaceans - blisters, n/v       Family History  Problem Relation Age of  Onset  . Cancer Mother        abdominal ca ?ovarian?  . Heart disease Father   . Cancer Sister        liver cancer  . Heart disease Brother   . COPD Brother   . Healthy Son   . Healthy Son      Social History Mr. Dugal reports that he quit smoking about 47 years ago. His smoking use included cigarettes. He has a 30.00 pack-year smoking history. He has never used smokeless tobacco. Mr. Deskins reports no history of alcohol use.   Review of Systems CONSTITUTIONAL: No weight loss, fever, chills, weakness or fatigue.  HEENT: Eyes: No visual loss, blurred vision, double vision or yellow sclerae.No hearing loss, sneezing, congestion, runny nose or sore throat.  SKIN: No rash or itching.  CARDIOVASCULAR: per hpi RESPIRATORY: No shortness of breath, cough or sputum.  GASTROINTESTINAL: No anorexia, nausea, vomiting or diarrhea. No abdominal pain or blood.  GENITOURINARY: No burning on urination, no polyuria NEUROLOGICAL: No headache, dizziness, syncope, paralysis, ataxia, numbness or tingling in the  extremities. No change in bowel or bladder control.  MUSCULOSKELETAL: No muscle, back pain, joint pain or stiffness.  LYMPHATICS: No enlarged nodes. No history of splenectomy.  PSYCHIATRIC: No history of depression or anxiety.  ENDOCRINOLOGIC: No reports of sweating, cold or heat intolerance. No polyuria or polydipsia.  Marland Kitchen   Physical Examination Today's Vitals   03/25/20 0815  BP: 128/80  Pulse: 75  SpO2: 96%  Weight: 187 lb 3.2 oz (84.9 kg)  Height: 5\' 10"  (1.778 m)   Body mass index is 26.86 kg/m.  Gen: resting comfortably, no acute distress HEENT: no scleral icterus, pupils equal round and reactive, no palptable cervical adenopathy,  CV: RRR, 3/6 systolic murmur apex, no jvd Resp: Clear to auscultation bilaterally GI: abdomen is soft, non-tender, non-distended, normal bowel sounds, no hepatosplenomegaly MSK: extremities are warm, no edema.  Skin: warm, no rash Neuro:  no focal deficits Psych: appropriate affect   Diagnostic Studies 03/2019 echo IMPRESSIONS    1. Left ventricular ejection fraction, by visual estimation, is 55 to  60%. The left ventricle has normal function. Normal left ventricular size.  There is mildly increased left ventricular hypertrophy.  2. Global right ventricle has normal systolic function.The right  ventricular size is normal. No increase in right ventricular wall  thickness.  3. Left atrial size was mildly dilated.  4. Right atrial size was normal.  5. Mild aortic valve annular calcification.  6. The mitral valve is abnormal. There is posterior leaflet prolapse and  a calcified density on the atrial side of the posterior leaflet that could  represent a partial flail subsegment of the posterior leaflet or perhaps  an old vegetation. There is  eccentric, anteriorly directed, moderate to severe mitral valve  regurgitation. PISA calculations are challenging with very eccentric jet,  but one image suggests MR radius is at least 1 cm  which is consistent with  severe regurgitation. Suggest TEE for  further evaluation if clinically indicated.  7. The tricuspid valve is grossly normal. Tricuspid valve regurgitation  is mild.  8. The aortic valve is tricuspid Aortic valve regurgitation is trivial by  color flow Doppler.  9. The pulmonic valve was grossly normal. Pulmonic valve regurgitation is  mild by color flow Doppler.  10. Normal pulmonary artery systolic pressure.  11. The tricuspid regurgitant velocity is 2.36 m/s, and with an assumed  right atrial pressure of 3 mmHg, the estimated right  ventricular systolic  pressure is normal at 25.3 mmHg.  12. The inferior vena cava is normal in size with greater than 50%  respiratory variability, suggesting right atrial pressure of 3 mmHg.   In comparison to the previous echocardiogram(s): Previous Echo at [facility]  showed LV EF 60%, mild LAE, moderate to severe MR with a wall-impinging MR  jet and a flail cusp of the posterior leaflet. Mild MR, PI and dilated  IVC. These images were not able  to be viewed directly.  FINDINGS  Left Ventricle: Left ventricular ejection fraction, by visual estimation,  is 55 to 60%. The left ventricle has normal function. There is mildly  increased left ventricular hypertrophy. Normal left ventricular size.  Spectral Doppler shows Left ventricular  diastolic parameters were normal pattern of LV diastolic filling.    08/5571 TEE IMPRESSIONS    1. Left ventricular ejection fraction, by estimation, is 60 to 65%. The  left ventricle has normal function. The left ventrical has no regional  wall motion abnormalities. Left ventricular diastolic function could not  be evaluated.  2. Right ventricular systolic function is normal. The right ventricular  size is normal.  3. Left atrial size was moderately dilated. No left atrial/left atrial  appendage thrombus was detected.  4. Right atrial size was moderately dilated.  5. Ruptured  chordae attached to the posterior leaflet with partial  prolapse of P2. Chronic finding when compared to 07/2013 TEE. There is  eccentric anterior directed MR thats difficult to quantify due to  eccentricity, appears moderate to severe. . The  mitral valve is abnormal. Moderate to severe MR mitral valve  regurgitation. No evidence of mitral stenosis.  6. Tricuspid valve regurgitation is mild to moderate.  7. The aortic valve is tricuspid. Aortic valve regurgitation is trivial .  No aortic stenosis is present.     Assessment and Plan   1. Mitral regurgitation - moderate to severe. Asymptomatic, no evidence of LV enlargement or dysfunction - we will continue to monitor, repeat echo after next visit.    2. HTN - he prefers HCTZ instead of chlorthaldone due to cost - bp at goal, continue current meds  3. Hyperlipidemia -overall at goal, continue statin       Arnoldo Lenis, M.D.

## 2020-03-27 ENCOUNTER — Telehealth: Payer: Self-pay

## 2020-03-27 ENCOUNTER — Telehealth: Payer: Self-pay | Admitting: Family Medicine

## 2020-03-27 DIAGNOSIS — H10503 Unspecified blepharoconjunctivitis, bilateral: Secondary | ICD-10-CM

## 2020-03-27 MED ORDER — TOBRAMYCIN 0.3 % OP SOLN: 2.0000 [drp] | Freq: Four times a day (QID) | OPHTHALMIC | 0 refills | Status: AC

## 2020-03-27 MED ORDER — OLOPATADINE HCL 0.1 % OP SOLN: 1.0000 [drp] | Freq: Two times a day (BID) | OPHTHALMIC | 12 refills | Status: DC

## 2020-03-27 NOTE — Telephone Encounter (Signed)
Pt called back to see if a provider is going to call in refill for him (see previous message). Pt says the Rx is for Tobramycin eye drops.   Please call pt to let him know if refill can be called in for him.

## 2020-03-27 NOTE — Telephone Encounter (Signed)
Telephone visit  Subjective: CC: eye swelling PCP: Janora Norlander, DO [id number] Tyler Diaz is a 74 y.o. male calls for telephone consult today. Patient provides verbal consent for consult held via phone.  Due to COVID-19 pandemic this visit was conducted virtually. This visit type was conducted due to national recommendations for restrictions regarding the COVID-19 Pandemic (e.g. social distancing, sheltering in place) in an effort to limit this patient's exposure and mitigate transmission in our community. All issues noted in this document were discussed and addressed.  A physical exam was not performed with this format.   Location of patient: home Location of provider: [facility] Others present for call: none  1. Conjunctivitis  He reports itchy eyes and was using an OTC allergy eye drop.  Symptoms have worsened and he now has swollen eyes.  He reports drainage from eye that is sticky.  He reports no visual changes.  He is using ice pack on his eyes.  ROS: Per HPI  Allergies  Allergen Reactions  . Shrimp [Shellfish Allergy] Hives, Nausea And Vomiting and Rash    "only shrimp" - crustaceans - blisters, n/v    Past Medical History:  Diagnosis Date  . Anemia    hx of  . Arthritis    hands/knees  . Blood clot in vein    RT ARM WITH PICC LINE   . C. difficile colitis 08/19/2013  . Colon cancer ([place]) 07/18/12 bx   rectum=Invasive adenocarcinoma w/ extracellular mucin,polyp=benign  . GERD (gastroesophageal reflux disease)    OCCASIONAL  . Hearing loss    partial greater on left  . Heart murmur   . Hematochezia    recent  . Herpes zoster - R flank - with severe pain 03/11/2013  . History of shingles   . Hyperlipidemia   . Hypertension   . Hypothyroidism as child   hx of  . Ileostomy - diverting loop - s/p takedown 06/28/2013 01/14/2013  . Mitral regurgitation due to partially flail posterior mitral valve leaflet   . Nocturia   . Numbness    TOES / FINGERS DUE TO CHEMO  .  Peripheral vascular disease (HCC)    neuropathy due to cancer treatment (fingers and toes)  . Radiation    FOR COLON CANCER  . Rectal cancer ([place])   . Rheumatic fever as child   hx  . Sinusitis    seasonal  . Tinnitus    left ear  . Umbilical hernia, incarcerated - s/p primary repair July 2014 01/03/2013    Current Outpatient Medications:  .  acetaminophen (TYLENOL) 500 MG tablet, Take 1,000 mg by mouth every 6 (six) hours as needed for headache. , Disp: , Rfl:  .  diclofenac sodium (VOLTAREN) 1 % GEL, Apply 4 g topically 4 (four) times daily. (for joint pain), Disp: 400 g, Rfl: 2 .  hydrochlorothiazide (HYDRODIURIL) 25 MG tablet, TAKE 1 TABLET BY MOUTH ONCE DAILY. STOPPING CHLORTHALIDONE, Disp: 90 tablet, Rfl: 1 .  hydrocortisone (PROCTO-MED HC) 2.5 % rectal cream, Place 1 application rectally 2 (two) times daily. x7-10 days prn hemorrhoid (may need to use brand for ins coverage) (Patient taking differently: Place 1 application rectally 2 (two) times daily as needed for hemorrhoids or anal itching. x7-10 days prn hemorrhoid (may need to use brand for ins coverage)), Disp: 30 g, Rfl: 0 .  ketoconazole (NIZORAL) 2 % cream, APPLY TOPICALLY TWICE DAILY (Patient taking differently: Apply 1 application topically 2 (two) times daily. ), Disp: 60 g, Rfl: 0 .  lisinopril (ZESTRIL) 40 MG tablet, Take 1 tablet by mouth once daily, Disp: 90 tablet, Rfl: 1 .  Multiple Vitamin (MULTIVITAMIN WITH MINERALS) TABS, Take 1 tablet by mouth every morning. , Disp: , Rfl:  .  oxymetazoline (AFRIN) 0.05 % nasal spray, Place 1 spray into both nostrils 2 (two) times daily as needed for congestion., Disp: , Rfl:  .  pravastatin (PRAVACHOL) 20 MG tablet, Take 1 tablet by mouth once daily, Disp: 90 tablet, Rfl: 0 .  Probiotic Product (DIGESTIVE ADVANTAGE PO), Take 1 tablet by mouth daily., Disp: , Rfl:  .  psyllium (METAMUCIL) 58.6 % packet, Take 1 packet by mouth daily., Disp: , Rfl:  .  triamcinolone ointment  (KENALOG) 0.1 %, APPLY  OINTMENT TOPICALLY TO AFFECTED AREA THREE TIMES DAILY, Disp: 30 g, Rfl: 0  Assessment/ Plan: 74 y.o. male   1. Blepharoconjunctivitis of both eyes, unspecified blepharoconjunctivitis type Start with Tobrex eyedrops.  May use every 6 hours for the next 7 days.  He may then start Patanol for maintenance.  Home care instructions reviewed and red flags were discussed.  Follow-up prn - tobramycin (TOBREX) 0.3 % ophthalmic solution; Place 2 drops into both eyes every 6 (six) hours for 7 days.  Dispense: 5 mL; Refill: 0 - olopatadine (PATANOL) 0.1 % ophthalmic solution; Place 1 drop into both eyes 2 (two) times daily. FOR eye allergies (start after antibiotic)  Dispense: 5 mL; Refill: 12   Start time: 4:53pm End time: 5:03pm  Total time spent on patient care (including telephone call/ virtual visit): 10 minutes  [place], [place] [phone]

## 2020-03-27 NOTE — Telephone Encounter (Signed)
Pt called stating that he was seen back in July by Osa Craver and was prescribed some medicine to take because his eyes/faces were swelling. Pt is requesting refill on that medicine. Says his allergies are flaring up again and that medicine really helped him last time.

## 2020-04-29 ENCOUNTER — Other Ambulatory Visit: Payer: Self-pay

## 2020-04-29 ENCOUNTER — Ambulatory Visit (INDEPENDENT_AMBULATORY_CARE_PROVIDER_SITE_OTHER): Payer: PPO

## 2020-04-29 DIAGNOSIS — Z23 Encounter for immunization: Secondary | ICD-10-CM | POA: Diagnosis not present

## 2020-04-29 NOTE — Progress Notes (Signed)
° °  Covid-19 Vaccination Clinic  Name:  Tyler Diaz    MRN: [id number] DOB: 26-Nov-1945  04/29/2020  [name] was observed post Covid-19 immunization for 15 minutes without incident. He was provided with Vaccine Information Sheet and instruction to access the V-Safe system.   [name] was instructed to call 911 with any severe reactions post vaccine:  Difficulty breathing   Swelling of face and throat   A fast heartbeat   A bad rash all over body   Dizziness and weakness   Immunizations Administered    Name Date Dose VIS Date Route   Moderna COVID-19 Vaccine 04/29/2020  2:58 PM 0.5 mL 03/25/2020 Intramuscular   Manufacturer: Moderna   Lot: [id number]   [place]: [id number]

## 2020-05-26 ENCOUNTER — Other Ambulatory Visit: Payer: Self-pay

## 2020-05-26 ENCOUNTER — Encounter: Payer: Self-pay | Admitting: Family Medicine

## 2020-05-26 ENCOUNTER — Ambulatory Visit (INDEPENDENT_AMBULATORY_CARE_PROVIDER_SITE_OTHER): Payer: PPO | Admitting: Family Medicine

## 2020-05-26 VITALS — BP 121/65 | HR 77 | Temp 97.6°F | Ht 70.0 in | Wt 183.8 lb

## 2020-05-26 DIAGNOSIS — I10 Essential (primary) hypertension: Secondary | ICD-10-CM

## 2020-05-26 DIAGNOSIS — E785 Hyperlipidemia, unspecified: Secondary | ICD-10-CM

## 2020-05-26 DIAGNOSIS — H1013 Acute atopic conjunctivitis, bilateral: Secondary | ICD-10-CM | POA: Diagnosis not present

## 2020-05-26 MED ORDER — OLOPATADINE HCL 0.2 % OP SOLN: OPHTHALMIC | 12 refills | Status: AC

## 2020-05-26 MED ORDER — PRAVASTATIN SODIUM 20 MG PO TABS: 20.0000 mg | ORAL_TABLET | Freq: Every day | ORAL | 3 refills | Status: DC

## 2020-05-26 MED ORDER — HYDROCHLOROTHIAZIDE 25 MG PO TABS: ORAL_TABLET | ORAL | 3 refills | Status: DC

## 2020-05-26 MED ORDER — LISINOPRIL 40 MG PO TABS: 40.0000 mg | ORAL_TABLET | Freq: Every day | ORAL | 3 refills | Status: DC

## 2020-05-26 NOTE — Patient Instructions (Signed)
I've switched the Patanol to Pataday (once daily eye drop).  It should be cheaper.

## 2020-05-26 NOTE — Progress Notes (Signed)
Subjective: CC: Follow-up hypertension, hyperlipidemia PCP: Raliegh Ip, DO RDE:YCXKGY Tyler Diaz is a 74 y.o. male presenting to clinic today for:  1.  Hypertension with hyperlipidemia Patient reports compliance with hydrochlorothiazide, lisinopril, Pravachol.  He is coming up due for refills on Pravachol.  No chest pain, shortness of breath or dizziness or falls  2.  Allergic conjunctivitis Patient with ongoing intermittent allergic conjunctivitis such that he had some facial swelling recently.  He was evaluated here in the office.  He does report use of the Patanol but notes that this is kind of expensive.  He asked for an alternative.  No excessive itching or redness of the eyes lately.  He uses an antihistamine intermittently.   ROS: Per HPI  Allergies  Allergen Reactions  . Shrimp [Shellfish Allergy] Hives, Nausea And Vomiting and Rash    "only shrimp" - crustaceans - blisters, n/v    Past Medical History:  Diagnosis Date  . Anemia    hx of  . Arthritis    hands/knees  . Blood clot in vein    RT ARM WITH PICC LINE   . C. difficile colitis 08/19/2013  . Colon cancer (HCC) 07/18/12 bx   rectum=Invasive adenocarcinoma w/ extracellular mucin,polyp=benign  . GERD (gastroesophageal reflux disease)    OCCASIONAL  . Hearing loss    partial greater on left  . Heart murmur   . Hematochezia    recent  . Herpes zoster - R flank - with severe pain 03/11/2013  . History of shingles   . Hyperlipidemia   . Hypertension   . Hypothyroidism as child   hx of  . Ileostomy - diverting loop - s/p takedown 06/28/2013 01/14/2013  . Mitral regurgitation due to partially flail posterior mitral valve leaflet   . Nocturia   . Numbness    TOES / FINGERS DUE TO CHEMO  . Peripheral vascular disease (HCC)    neuropathy due to cancer treatment (fingers and toes)  . Radiation    FOR COLON CANCER  . Rectal cancer (HCC)   . Rheumatic fever as child   hx  . Sinusitis    seasonal  .  Tinnitus    left ear  . Umbilical hernia, incarcerated - s/p primary repair July 2014 01/03/2013    Current Outpatient Medications:  .  acetaminophen (TYLENOL) 500 MG tablet, Take 1,000 mg by mouth every 6 (six) hours as needed for headache. , Disp: , Rfl:  .  diclofenac sodium (VOLTAREN) 1 % GEL, Apply 4 g topically 4 (four) times daily. (for joint pain), Disp: 400 g, Rfl: 2 .  hydrochlorothiazide (HYDRODIURIL) 25 MG tablet, TAKE 1 TABLET BY MOUTH ONCE DAILY. STOPPING CHLORTHALIDONE, Disp: 90 tablet, Rfl: 1 .  hydrocortisone (PROCTO-MED HC) 2.5 % rectal cream, Place 1 application rectally 2 (two) times daily. x7-10 days prn hemorrhoid (may need to use brand for ins coverage) (Patient taking differently: Place 1 application rectally 2 (two) times daily as needed for hemorrhoids or anal itching. x7-10 days prn hemorrhoid (may need to use brand for ins coverage)), Disp: 30 g, Rfl: 0 .  ketoconazole (NIZORAL) 2 % cream, APPLY TOPICALLY TWICE DAILY (Patient taking differently: Apply 1 application topically 2 (two) times daily. ), Disp: 60 g, Rfl: 0 .  lisinopril (ZESTRIL) 40 MG tablet, Take 1 tablet by mouth once daily, Disp: 90 tablet, Rfl: 1 .  Multiple Vitamin (MULTIVITAMIN WITH MINERALS) TABS, Take 1 tablet by mouth every morning. , Disp: , Rfl:  .  olopatadine (PATANOL) 0.1 % ophthalmic solution, Place 1 drop into both eyes 2 (two) times daily. FOR eye allergies (start after antibiotic), Disp: 5 mL, Rfl: 12 .  oxymetazoline (AFRIN) 0.05 % nasal spray, Place 1 spray into both nostrils 2 (two) times daily as needed for congestion., Disp: , Rfl:  .  pravastatin (PRAVACHOL) 20 MG tablet, Take 1 tablet by mouth once daily, Disp: 90 tablet, Rfl: 0 .  Probiotic Product (DIGESTIVE ADVANTAGE PO), Take 1 tablet by mouth daily., Disp: , Rfl:  .  psyllium (METAMUCIL) 58.6 % packet, Take 1 packet by mouth daily., Disp: , Rfl:  .  triamcinolone ointment (KENALOG) 0.1 %, APPLY  OINTMENT TOPICALLY TO AFFECTED AREA  THREE TIMES DAILY, Disp: 30 g, Rfl: 0 Social History   Socioeconomic History  . Marital status: Widowed    Spouse name: Not on file  . Number of children: 2  . Years of education: 9  . Highest education level: 9th grade  Occupational History  . 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 Smoking status: Former Smoker    Packs/day: 1.50    Years: 20.00    Pack years: 30.00    Types: Cigarettes    Quit date: 07/27/1972    Years since quitting: 47.8  . Smokeless tobacco: Never Used  Vaping Use  . Vaping Use: Never used  Substance and Sexual Activity  . Alcohol use: No  . Drug use: No  . Sexual activity: Not Currently  Other Topics Concern  . 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  Has two adult children and one grandchild. One son lives with him.    Social Determinants of Health   Financial Resource Strain: Not on file  Food Insecurity: No Food Insecurity  . Worried About Charity fundraiser in the Last Year: Never true  . Ran Out of Food in the Last Year: Never true  Transportation Needs: No Transportation Needs  . Lack of Transportation (Medical): No  . Lack of Transportation (Non-Medical): No  Physical Activity: Inactive  . Days of Exercise per Week: 0 days  . Minutes of Exercise per Session: 0 min  Stress: No Stress Concern Present  . Feeling of Stress : Only a little  Social Connections: Moderately Isolated  . Frequency of Communication with Friends and Family: More than three times a week  . Frequency of Social Gatherings with Friends and Family: More than three times a week  . Attends Religious Services: 1 to 4 times per year  . Active Member of Clubs or Organizations: No  . Attends Archivist Meetings: Never  . Marital Status: Widowed  Intimate Partner Violence: Not on file   Family History  Problem Relation Age of Onset  . Cancer Mother         abdominal ca ?ovarian?  . Heart disease Father   . Cancer Sister        liver cancer  . Heart disease Brother   . COPD Brother   . Healthy Son   . Healthy Son     Objective: Office vital signs reviewed. BP 121/65   Pulse 77   Temp 97.6 F (36.4 C)   Ht 5\' 10"  (1.778 m)   Wt 183 lb 12.8 oz (83.4 kg)   SpO2 98%   BMI 26.37 kg/m   Physical Examination:  General:  Awake, alert, well nourished, No acute distress HEENT: Normal; sclera white.  Moist mucous membranes Cardio: regular rate and rhythm, S1S2 heard, 2-3/6 SEM appreciated Pulm: clear to auscultation bilaterally, no wheezes, rhonchi or rales; normal work of breathing on room air Extremities: warm, well perfused, No edema, cyanosis or clubbing; +2 pulses bilaterally MSK: normal gait and station  Assessment/ Plan: 74 y.o. male   Allergic conjunctivitis of both eyes - Plan: Olopatadine HCl 0.2 % SOLN  Essential hypertension - Plan: lisinopril (ZESTRIL) 40 MG tablet  Hyperlipidemia, unspecified hyperlipidemia type - Plan: pravastatin (PRAVACHOL) 20 MG tablet  Switch Patanol to Pataday given cost.  Have also given him a good Rx coupon.  Advised of SIG changes.  Blood pressures under good control.  Continue current regimen  Continue statin.  Not due for fasting lipid panel.  Plan for full physical exam at next visit.  He is aware to be fasting for that appointment  No orders of the defined types were placed in this encounter.  No orders of the defined types were placed in this encounter.    Janora Norlander, DO [place] [phone]

## 2020-06-18 ENCOUNTER — Other Ambulatory Visit: Payer: Self-pay | Admitting: Family Medicine

## 2020-06-18 DIAGNOSIS — M8949 Other hypertrophic osteoarthropathy, multiple sites: Secondary | ICD-10-CM

## 2020-06-18 DIAGNOSIS — I10 Essential (primary) hypertension: Secondary | ICD-10-CM

## 2020-06-18 DIAGNOSIS — M159 Polyosteoarthritis, unspecified: Secondary | ICD-10-CM

## 2020-06-19 ENCOUNTER — Other Ambulatory Visit: Payer: Self-pay | Admitting: Family Medicine

## 2020-06-19 DIAGNOSIS — I10 Essential (primary) hypertension: Secondary | ICD-10-CM

## 2020-09-16 ENCOUNTER — Other Ambulatory Visit: Payer: Self-pay | Admitting: Family Medicine

## 2020-10-16 ENCOUNTER — Ambulatory Visit: Payer: PPO | Admitting: Cardiology

## 2020-11-03 ENCOUNTER — Telehealth: Payer: Self-pay | Admitting: Family Medicine

## 2020-11-03 NOTE — Telephone Encounter (Signed)
Last colonoscopy 12/04/13. Scanned in under procedures it was abnormal. Please advise covering Dr. Lajuana Ripple

## 2020-11-09 NOTE — Telephone Encounter (Signed)
Lmtcb.

## 2020-11-09 NOTE — Telephone Encounter (Signed)
I will have to defer to GI as the usual cut off is 77 years old. They will have to weight benefit vs risks.

## 2020-11-09 NOTE — Telephone Encounter (Signed)
Pt r/c.

## 2020-11-09 NOTE — Telephone Encounter (Signed)
Patient aware and verbalized understanding. °

## 2020-11-24 ENCOUNTER — Other Ambulatory Visit: Payer: Self-pay

## 2020-11-24 ENCOUNTER — Ambulatory Visit (INDEPENDENT_AMBULATORY_CARE_PROVIDER_SITE_OTHER): Payer: PPO | Admitting: Family Medicine

## 2020-11-24 ENCOUNTER — Encounter: Payer: Self-pay | Admitting: Family Medicine

## 2020-11-24 VITALS — BP 136/81 | HR 77 | Temp 97.8°F | Resp 20 | Ht 70.0 in | Wt 190.2 lb

## 2020-11-24 DIAGNOSIS — Z0001 Encounter for general adult medical examination with abnormal findings: Secondary | ICD-10-CM | POA: Diagnosis not present

## 2020-11-24 DIAGNOSIS — R21 Rash and other nonspecific skin eruption: Secondary | ICD-10-CM

## 2020-11-24 DIAGNOSIS — E781 Pure hyperglyceridemia: Secondary | ICD-10-CM | POA: Diagnosis not present

## 2020-11-24 DIAGNOSIS — Z0000 Encounter for general adult medical examination without abnormal findings: Secondary | ICD-10-CM

## 2020-11-24 DIAGNOSIS — I10 Essential (primary) hypertension: Secondary | ICD-10-CM | POA: Diagnosis not present

## 2020-11-24 DIAGNOSIS — Z125 Encounter for screening for malignant neoplasm of prostate: Secondary | ICD-10-CM | POA: Diagnosis not present

## 2020-11-24 DIAGNOSIS — D692 Other nonthrombocytopenic purpura: Secondary | ICD-10-CM

## 2020-11-24 DIAGNOSIS — R718 Other abnormality of red blood cells: Secondary | ICD-10-CM | POA: Diagnosis not present

## 2020-11-24 MED ORDER — CLOTRIMAZOLE-BETAMETHASONE 1-0.05 % EX CREA: 1.0000 "application " | TOPICAL_CREAM | Freq: Two times a day (BID) | CUTANEOUS | 0 refills | Status: DC

## 2020-11-24 NOTE — Progress Notes (Signed)
Tyler Diaz is a 75 y.o. male presents to office today for annual physical exam examination.    Concerns today include: 1.  Rash around neck Patient was had a rash on his neck for the last couple of weeks.  Describes an itchy.  When its in the sun it burns.  He denies any exudates or vesicle formation.  He is worried about possible shingles as he is had this on his right side of the abdomen in the past.  He denies it feeling the same however.  He has been applying triamcinolone to the affected area but this seems just to make it burn more.  Denies rash elsewhere.  No known contact with new substances.  It seems to be localized only to the neck  Occupation: Retired  Diet: Fair, Exercise: No structured Last eye exam: Up-to-date Last dental exam: Needs Last colonoscopy: Up-to-date Immunizations needed: Shingles, tetanus and COVID booster needed.  He will get this done at his pharmacy Immunization History  Administered Date(s) Administered   Fluad Quad(high Dose 65+) 03/21/2019, 03/25/2020   Influenza Inj Mdck Quad Pf 03/21/2019   Influenza, High Dose Seasonal PF 03/16/2018   Influenza,inj,Quad PF,6+ Mos 05/21/2013, 03/13/2014, 03/10/2015, 03/14/2016, 03/08/2017   Moderna Sars-Covid-2 Vaccination 03/25/2020, 04/29/2020   Pneumococcal Polysaccharide-23 05/21/2013     Past Medical History:  Diagnosis Date   Anemia    hx of   Arthritis    hands/knees   Blood clot in vein    RT ARM WITH PICC LINE    C. difficile colitis 08/19/2013   Colon cancer ([facility]) 07/18/12 bx   rectum=Invasive adenocarcinoma w/ extracellular mucin,polyp=benign   GERD (gastroesophageal reflux disease)    OCCASIONAL   Hearing loss    partial greater on left   Heart murmur    Hematochezia    recent   Herpes zoster - R flank - with severe pain 03/11/2013   History of shingles    Hyperlipidemia    Hypertension    Hypothyroidism as child   hx of   Ileostomy - diverting loop - s/p takedown 06/28/2013 01/14/2013    Mitral regurgitation due to partially flail posterior mitral valve leaflet    Nocturia    Numbness    TOES / FINGERS DUE TO CHEMO   Peripheral vascular disease (HCC)    neuropathy due to cancer treatment (fingers and toes)   Radiation    FOR COLON CANCER   Rectal cancer (HCC)    Rheumatic fever as child   hx   Sinusitis    seasonal   Tinnitus    left ear   Umbilical hernia, incarcerated - s/p primary repair July 2014 01/03/2013   Social History   Socioeconomic History   Marital status: Widowed    Spouse name: Not on file   Number of children: 2   Years of education: 9   Highest education level: 9th grade  Occupational History   Occupation: Retired    Fish farm manager: Marine scientist    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  Has two adult children and one grandchild. One son lives with him.    Social Determinants of Health   Financial Resource Strain: Not on file  Food Insecurity: No Food Insecurity   Worried About Charity fundraiser in the Last Year: Never true   Ran Out of Food in the Last Year: Never true  Transportation Needs: No Transportation Needs   Lack of Transportation (Medical): No   Lack of Transportation (Non-Medical): No  Physical Activity: Inactive   Days of Exercise per Week: 0 days   Minutes of Exercise per Session: 0 min  Stress: No Stress Concern Present   Feeling of Stress : Only a little  Social Connections: Moderately Isolated   Frequency of Communication with Friends and Family: More than three times a  week   Frequency of Social Gatherings with Friends and Family: More than three times a week   Attends Religious Services: 1 to 4 times per year   Active Member of [employer] or Organizations: No   Attends Archivist Meetings: Never   Marital Status: Widowed  Intimate Partner Violence: Not on file   Past Surgical History:  Procedure Laterality Date   CARPAL TUNNEL RELEASE Right 2002   ILEOSTOMY N/A 12/27/2012   Procedure: DIVERTING LOOP ILEOSTOMY;  Surgeon: Adin Hector, MD;  Location: [facility];  Service: General;  Laterality: N/A;   ILEOSTOMY CLOSURE N/A 06/28/2013   Procedure: loop ILEOSTOMY TAKEDOWN examination of anesthesia ;  Surgeon: Adin Hector, MD;  Location: [facility];  Service: General;  Laterality: N/A;   INGUINAL HERNIA REPAIR Left    INGUINAL HERNIA REPAIR Bilateral 08/13/2015   Procedure: LAPAROSCOPIC BILATERAL INGUINAL HERNIA REPAIR WITH MESH;  Surgeon: Michael Boston, MD;  Location: [facility];  Service: General;  Laterality: Bilateral;   INSERTION OF MESH Bilateral 08/13/2015   Procedure: INSERTION OF MESH;  Surgeon: Michael Boston, MD;  Location: [facility];  Service: General;  Laterality: Bilateral;   LAPAROSCOPIC LOW ANTERIOR RESCECTION WITH COLOANAL ANASTOMOSIS N/A 12/27/2012   Procedure: LAPAROSCOPIC LOW ANTERIOR RESCECTION WITH COLOANAL ANASTOMOSIS;  Surgeon: Adin Hector, MD;  Location: [facility];  Service: General;  Laterality: N/A;   LAPAROSCOPIC LOW ANTERIOR RESECTION N/A 12/27/2012   Procedure: LAPAROSCOPIC LOW ANTERIOR RESECTION, COLO-ANAL ANASTOMOSIS, DIVERTING LOOP ILEOSTOMY,SPLENIC FLEXURE MOBILIZATION, PRIMARY INCARCERATED UMBILICAL HERNIA REPAIR;  Surgeon: Adin Hector, MD;  Location: [facility];  Service: General;  Laterality: N/A;   TEE WITHOUT CARDIOVERSION N/A 07/10/2013   Procedure: TRANSESOPHAGEAL ECHOCARDIOGRAM (TEE);  Surgeon: Candee Furbish, MD;  Location: [facility];  Service: Cardiovascular;  Laterality: N/A;   TEE WITHOUT CARDIOVERSION N/A 08/21/2013    Procedure: TRANSESOPHAGEAL ECHOCARDIOGRAM (TEE);  Surgeon: Sanda Klein, MD;  Location: [facility];  Service: Cardiovascular;  Laterality: N/A;   TEE WITHOUT CARDIOVERSION N/A 07/16/2019   Procedure: TRANSESOPHAGEAL ECHOCARDIOGRAM (TEE) WITH PROPOFOL;  Surgeon: Arnoldo Lenis, MD;  Location: [facility];  Service: Endoscopy;  Laterality: N/A;   TONSILLECTOMY     as child   UMBILICAL HERNIA REPAIR  12/27/2012   Procedure: PRIMARY REPAIR INCARCERATED UMBILICAL HERNIA;  Surgeon: Adin Hector, MD;  Location: [facility];  Service: General;;   Family History  Problem Relation Age of Onset   Cancer Mother        abdominal ca ?ovarian?   Heart disease Father    Cancer Sister  liver cancer   Heart disease Brother    COPD Brother    Healthy Son    Healthy Son     Current Outpatient Medications:    acetaminophen (TYLENOL) 500 MG tablet, Take 1,000 mg by mouth every 6 (six) hours as needed for headache. , Disp: , Rfl:    diclofenac Sodium (VOLTAREN) 1 % GEL, APPLY 4 GRAMS TOPICALLY 4 TIMES DAILY FOR JOINT PAIN, Disp: 400 g, Rfl: 0   hydrochlorothiazide (HYDRODIURIL) 25 MG tablet, TAKE 1 TABLET BY MOUTH ONCE DAILY. STOPPING CHLORTHALIDONE, Disp: 90 tablet, Rfl: 3   ketoconazole (NIZORAL) 2 % cream, APPLY TOPICALLY TWICE DAILY (Patient taking differently: Apply 1 application topically 2 (two) times daily.), Disp: 60 g, Rfl: 0   lisinopril (ZESTRIL) 40 MG tablet, Take 1 tablet (40 mg total) by mouth daily., Disp: 90 tablet, Rfl: 3   Multiple Vitamin (MULTIVITAMIN WITH MINERALS) TABS, Take 1 tablet by mouth every morning. , Disp: , Rfl:    oxymetazoline (AFRIN) 0.05 % nasal spray, Place 1 spray into both nostrils 2 (two) times daily as needed for congestion., Disp: , Rfl:    pravastatin (PRAVACHOL) 20 MG tablet, Take 1 tablet (20 mg total) by mouth daily., Disp: 90 tablet, Rfl: 3   Probiotic Product (DIGESTIVE ADVANTAGE PO), Take 1 tablet by mouth daily., Disp: , Rfl:    PROCTO-MED HC 2.5 %  rectal cream, INSERT 1 APPLICATORFUL RECTALLY TWICE DAILY AS NEEDED FOR HEMORRHOIDS OR ANAL ITCHING FOR 7-10 DAYS, Disp: 28 g, Rfl: 0   psyllium (METAMUCIL) 58.6 % packet, Take 1 packet by mouth daily., Disp: , Rfl:    triamcinolone ointment (KENALOG) 0.1 %, APPLY  OINTMENT TOPICALLY TO AFFECTED AREA THREE TIMES DAILY, Disp: 30 g, Rfl: 0   Olopatadine HCl 0.2 % SOLN, Instill 1 drop in each eye ONCE daily. (Patient not taking: Reported on 11/24/2020), Disp: 2.5 mL, Rfl: 12  Allergies  Allergen Reactions   Shrimp [Shellfish Allergy] Hives, Nausea And Vomiting and Rash    "only shrimp" - crustaceans - blisters, n/v      ROS: Review of Systems A comprehensive review of systems was negative except for: Eyes: positive for watery eyes Ears, nose, mouth, throat, and face: positive for hearing loss Integument/breast: positive for rash Musculoskeletal: positive for arthralgias Allergic/Immunologic: positive for rash    Physical exam BP 136/81   Pulse 77   Temp 97.8 F (36.6 C) (Temporal)   Resp 20   Ht $R'5\' 10"'ru$  (1.778 m)   Wt 190 lb 4 oz (86.3 kg)   SpO2 100%   BMI 27.30 kg/m  General appearance: alert, cooperative, appears stated age, and no distress Head: Normocephalic, without obvious abnormality, atraumatic Eyes: negative findings: lids and lashes normal, conjunctivae and sclerae normal, corneas clear, and mild tearing of right eye noted Ears: normal TM's and external ear canals both ears Nose: Nares normal. Septum midline. Mucosa normal. No drainage or sinus tenderness. Throat: lips, mucosa, and tongue normal; teeth and gums normal Neck: no adenopathy, supple, symmetrical, trachea midline, and thyroid not enlarged, symmetric, no tenderness/mass/nodules Back: symmetric, no curvature. ROM normal. No CVA tenderness. Lungs: clear to auscultation bilaterally Chest wall: no tenderness Heart:  Regular rate and rhythm.  S1-S2 heard.  2 out of 6 systolic ejection murmur noted most prominently  over the mitral valve Abdomen: soft, non-tender; bowel sounds normal; no masses,  no organomegaly Extremities: extremities normal, atraumatic, no cyanosis or edema Pulses: 2+ and symmetric Skin:  Erythematous, blanching maculopapular rash noted  along the neck.  There is associated flaking of the skin but no secondary bacterial infection appreciated.  He has senile purpura noted on the upper extremities Lymph nodes: Cervical, supraclavicular, and axillary nodes normal. Neurologic: Alert and oriented X 3, normal strength and tone. Normal symmetric reflexes. Normal coordination and gait    Assessment/ Plan: [name] here for annual physical exam.   Annual physical exam  Essential hypertension - Plan: CMP14+EGFR  Hypertriglyceridemia - Plan: CMP14+EGFR, Lipid Panel, TSH  Elevated MCV - Plan: Vitamin B12, CBC  Screening for malignant neoplasm of prostate - Plan: PSA  Rash of neck - Plan: clotrimazole-betamethasone (LOTRISONE) cream  Senile purpura (HCC)  BP well controlled. Check CMP  Check lipid/ TSH  Vit b12 and CBC given elevated MCV  PSA ordered  Rash consistent with dermatitis. Uncertain etiology.  Will cover for fungal infection as well.  Follow up in 2 weeks if not resolved.  Purpura senilis noted.  Not anticoagulated.  Likely secondary to thinning of skin  Counseled on healthy lifestyle choices, including diet (rich in fruits, vegetables and lean meats and low in salt and simple carbohydrates) and exercise (at least 30 minutes of moderate physical activity daily).  Quillan Whitter M. Lajuana Ripple, DO

## 2020-11-24 NOTE — Patient Instructions (Signed)
You had labs performed today.  You will be contacted with the results of the labs once they are available, usually in the next 3 business days for routine lab work.  If you have an active my chart account, they will be released to your MyChart.  If you prefer to have these labs released to you via telephone, please let us know.  If you had a pap smear or biopsy performed, expect to be contacted in about 7-10 days.  Preventive Care 77 Years and Older, Male Preventive care refers to lifestyle choices and visits with your health care provider that can promote health and wellness. This includes: A yearly physical exam. This is also called an annual wellness visit. Regular dental and eye exams. Immunizations. Screening for certain conditions. Healthy lifestyle choices, such as: Eating a healthy diet. Getting regular exercise. Not using drugs or products that contain nicotine and tobacco. Limiting alcohol use. What can I expect for my preventive care visit? Physical exam Your health care provider will check your: Height and weight. These may be used to calculate your BMI (body mass index). BMI is a measurement that tells if you are at a healthy weight. Heart rate and blood pressure. Body temperature. Skin for abnormal spots. Counseling Your health care provider may ask you questions about your: Past medical problems. Family's medical history. Alcohol, tobacco, and drug use. Emotional well-being. Home life and relationship well-being. Sexual activity. Diet, exercise, and sleep habits. History of falls. Memory and ability to understand (cognition). Work and work Statistician. Access to firearms. What immunizations do I need?  Vaccines are usually given at various ages, according to a schedule. Your health care provider will recommend vaccines for you based on your age, medicalhistory, and lifestyle or other factors, such as travel or where you work. What tests do I need? Blood  tests Lipid and cholesterol levels. These may be checked every 5 years, or more often depending on your overall health. Hepatitis C test. Hepatitis B test. Screening Lung cancer screening. You may have this screening every year starting at age 47 if you have a 30-pack-year history of smoking and currently smoke or have quit within the past 15 years. Colorectal cancer screening. All adults should have this screening starting at age 87 and continuing until age 37. Your health care provider may recommend screening at age 34 if you are at increased risk. You will have tests every 1-10 years, depending on your results and the type of screening test. Prostate cancer screening. Recommendations will vary depending on your family history and other risks. Genital exam to check for testicular cancer or hernias. Diabetes screening. This is done by checking your blood sugar (glucose) after you have not eaten for a while (fasting). You may have this done every 1-3 years. Abdominal aortic aneurysm (AAA) screening. You may need this if you are a current or former smoker. STD (sexually transmitted disease) testing, if you are at risk. Follow these instructions at home: Eating and drinking  Eat a diet that includes fresh fruits and vegetables, whole grains, lean protein, and low-fat dairy products. Limit your intake of foods with high amounts of sugar, saturated fats, and salt. Take vitamin and mineral supplements as recommended by your health care provider. Do not drink alcohol if your health care provider tells you not to drink. If you drink alcohol: Limit how much you have to 0-2 drinks a day. Be aware of how much alcohol is in your drink. In the [place] one drink  equals one 12 oz bottle of beer (355 mL), one 5 oz glass of wine (148 mL), or one 1 oz glass of hard liquor (44 mL).  Lifestyle Take daily care of your teeth and gums. Brush your teeth every morning and night with fluoride toothpaste. Floss one  time each day. Stay active. Exercise for at least 30 minutes 5 or more days each week. Do not use any products that contain nicotine or tobacco, such as cigarettes, e-cigarettes, and chewing tobacco. If you need help quitting, ask your health care provider. Do not use drugs. If you are sexually active, practice safe sex. Use a condom or other form of protection to prevent STIs (sexually transmitted infections). Talk with your health care provider about taking a low-dose aspirin or statin. Find healthy ways to cope with stress, such as: Meditation, yoga, or listening to music. Journaling. Talking to a trusted person. Spending time with friends and family. Safety Always wear your seat belt while driving or riding in a vehicle. Do not drive: If you have been drinking alcohol. Do not ride with someone who has been drinking. When you are tired or distracted. While texting. Wear a helmet and other protective equipment during sports activities. If you have firearms in your house, make sure you follow all gun safety procedures. What's next? Visit your health care provider once a year for an annual wellness visit. Ask your health care provider how often you should have your eyes and teeth checked. Stay up to date on all vaccines. This information is not intended to replace advice given to you by your health care provider. Make sure you discuss any questions you have with your healthcare provider. Document Revised: 02/19/2019 Document Reviewed: 05/17/2018 Elsevier Patient Education  2022 [employer].

## 2020-11-25 LAB — CMP14+EGFR
ALT: 33 IU/L (ref 0–44)
AST: 27 IU/L (ref 0–40)
Albumin/Globulin Ratio: 2.2 (ref 1.2–2.2)
Albumin: 4.4 g/dL (ref 3.7–4.7)
Alkaline Phosphatase: 51 IU/L (ref 44–121)
BUN/Creatinine Ratio: 15 (ref 10–24)
BUN: 17 mg/dL (ref 8–27)
Bilirubin Total: 0.9 mg/dL (ref 0.0–1.2)
CO2: 21 mmol/L (ref 20–29)
Calcium: 9.4 mg/dL (ref 8.6–10.2)
Chloride: 106 mmol/L (ref 96–106)
Creatinine, Ser: 1.16 mg/dL (ref 0.76–1.27)
Globulin, Total: 2 g/dL (ref 1.5–4.5)
Glucose: 105 mg/dL — ABNORMAL HIGH (ref 65–99)
Potassium: 4.4 mmol/L (ref 3.5–5.2)
Sodium: 143 mmol/L (ref 134–144)
Total Protein: 6.4 g/dL (ref 6.0–8.5)
eGFR: 65 mL/min/{1.73_m2} (ref >59)

## 2020-11-25 LAB — CBC
Hematocrit: 37.5 % (ref 37.5–51.0)
Hemoglobin: 12.7 g/dL — ABNORMAL LOW (ref 13.0–17.7)
MCH: 33.9 pg — ABNORMAL HIGH (ref 26.6–33.0)
MCHC: 33.9 g/dL (ref 31.5–35.7)
MCV: 100 fL — ABNORMAL HIGH (ref 79–97)
Platelets: 172 10*3/uL (ref 150–450)
RBC: 3.75 x10E6/uL — ABNORMAL LOW (ref 4.14–5.80)
RDW: 13.5 % (ref 11.6–15.4)
WBC: 8.5 10*3/uL (ref 3.4–10.8)

## 2020-11-25 LAB — PSA: Prostate Specific Ag, Serum: 2.6 ng/mL (ref 0.0–4.0)

## 2020-11-25 LAB — LIPID PANEL
Chol/HDL Ratio: 3.5 ratio (ref 0.0–5.0)
Cholesterol, Total: 140 mg/dL (ref 100–199)
HDL: 40 mg/dL (ref >39)
LDL Chol Calc (NIH): 76 mg/dL (ref 0–99)
Triglycerides: 137 mg/dL (ref 0–149)
VLDL Cholesterol Cal: 24 mg/dL (ref 5–40)

## 2020-11-25 LAB — TSH: TSH: 4.02 u[IU]/mL (ref 0.450–4.500)

## 2020-11-25 LAB — VITAMIN B12: Vitamin B-12: 510 pg/mL (ref 232–1245)

## 2020-11-26 ENCOUNTER — Ambulatory Visit (INDEPENDENT_AMBULATORY_CARE_PROVIDER_SITE_OTHER): Payer: PPO

## 2020-11-26 VITALS — BP 113/70 | HR 85 | Ht 70.0 in | Wt 190.0 lb

## 2020-11-26 DIAGNOSIS — Z0000 Encounter for general adult medical examination without abnormal findings: Secondary | ICD-10-CM | POA: Diagnosis not present

## 2020-11-26 NOTE — Progress Notes (Signed)
Subjective:   BRANNON LEVENE is a 75 y.o. male who presents for Medicare Annual/Subsequent preventive examination.  Virtual Visit via Telephone Note  I connected with  [name] on 11/26/20 at  9:00 AM EDT by telephone and verified that I am speaking with the correct person using two identifiers.  Location: Patient: Home Provider: [facility] Persons participating in the virtual visit: patient/Nurse Health Advisor   I discussed the limitations, risks, security and privacy concerns of performing an evaluation and management service by telephone and the availability of in person appointments. The patient expressed understanding and agreed to proceed.  Interactive audio and video telecommunications were attempted between this nurse and patient, however failed, due to patient having technical difficulties OR patient did not have access to video capability.  We continued and completed visit with audio only.  Some vital signs may be absent or patient reported.   Dangelo Guzzetta E Meriel Kelliher, LPN   Review of Systems     Cardiac Risk Factors include: advanced age (>43men, >51 women);male gender;sedentary lifestyle;dyslipidemia;hypertension     Objective:    Today's Vitals   11/26/20 0910  BP: 113/70  Pulse: 85  Weight: 190 lb (86.2 kg)  Height: 5\' 10"  (1.778 m)   Body mass index is 27.26 kg/m.  Advanced Directives 11/26/2020 11/26/2019 07/12/2019 07/11/2019 11/19/2018 11/13/2017 11/13/2017  Does Patient Have a Medical Advance Directive? No No - Yes No (No Data) No  Type of Advance Directive - - - Living will - - -  Does patient want to make changes to medical advance directive? - - - No - Patient declined - - -  Would patient like information on creating a medical advance directive? No - Patient declined No - Patient declined No - Patient declined No - Patient declined No - Patient declined - No - Patient declined  Pre-existing out of facility DNR order (yellow form or pink MOST form) - - - - - - -     Current Medications (verified) Outpatient Encounter Medications as of 11/26/2020  Medication Sig   acetaminophen (TYLENOL) 500 MG tablet Take 1,000 mg by mouth every 6 (six) hours as needed for headache.    clotrimazole-betamethasone (LOTRISONE) cream Apply 1 application topically 2 (two) times daily. To neck for rash for up to 2 weeks.   diclofenac Sodium (VOLTAREN) 1 % GEL APPLY 4 GRAMS TOPICALLY 4 TIMES DAILY FOR JOINT PAIN   hydrochlorothiazide (HYDRODIURIL) 25 MG tablet TAKE 1 TABLET BY MOUTH ONCE DAILY. STOPPING CHLORTHALIDONE   ketoconazole (NIZORAL) 2 % cream APPLY TOPICALLY TWICE DAILY (Patient taking differently: Apply 1 application topically 2 (two) times daily.)   lisinopril (ZESTRIL) 40 MG tablet Take 1 tablet (40 mg total) by mouth daily.   Multiple Vitamin (MULTIVITAMIN WITH MINERALS) TABS Take 1 tablet by mouth every morning.    Olopatadine HCl 0.2 % SOLN Instill 1 drop in each eye ONCE daily. (Patient not taking: Reported on 11/24/2020)   oxymetazoline (AFRIN) 0.05 % nasal spray Place 1 spray into both nostrils 2 (two) times daily as needed for congestion.   pravastatin (PRAVACHOL) 20 MG tablet Take 1 tablet (20 mg total) by mouth daily.   Probiotic Product (DIGESTIVE ADVANTAGE PO) Take 1 tablet by mouth daily.   PROCTO-MED HC 2.5 % rectal cream INSERT 1 APPLICATORFUL RECTALLY TWICE DAILY AS NEEDED FOR HEMORRHOIDS OR ANAL ITCHING FOR 7-10 DAYS   psyllium (METAMUCIL) 58.6 % packet Take 1 packet by mouth daily.   triamcinolone ointment (KENALOG) 0.1 % APPLY  OINTMENT TOPICALLY TO AFFECTED AREA THREE TIMES DAILY   No facility-administered encounter medications on file as of 11/26/2020.    Allergies (verified) Shrimp [shellfish allergy]   History: Past Medical History:  Diagnosis Date   Anemia    hx of   Arthritis    hands/knees   Blood clot in vein    RT ARM WITH PICC LINE    C. difficile colitis 08/19/2013   Colon cancer ([facility]) 07/18/12 bx   rectum=Invasive  adenocarcinoma w/ extracellular mucin,polyp=benign   GERD (gastroesophageal reflux disease)    OCCASIONAL   Hearing loss    partial greater on left   Heart murmur    Hematochezia    recent   Herpes zoster - R flank - with severe pain 03/11/2013   History of shingles    Hyperlipidemia    Hypertension    Hypothyroidism as child   hx of   Ileostomy - diverting loop - s/p takedown 06/28/2013 01/14/2013   Mitral regurgitation due to partially flail posterior mitral valve leaflet    Nocturia    Numbness    TOES / FINGERS DUE TO CHEMO   Peripheral vascular disease (HCC)    neuropathy due to cancer treatment (fingers and toes)   Radiation    FOR COLON CANCER   Rectal cancer (HCC)    Rheumatic fever as child   hx   Sinusitis    seasonal   Tinnitus    left ear   Umbilical hernia, incarcerated - s/p primary repair July 2014 01/03/2013   Past Surgical History:  Procedure Laterality Date   CARPAL TUNNEL RELEASE Right 2002   ILEOSTOMY N/A 12/27/2012   Procedure: DIVERTING LOOP ILEOSTOMY;  Surgeon: Adin Hector, MD;  Location: [facility];  Service: General;  Laterality: N/A;   ILEOSTOMY CLOSURE N/A 06/28/2013   Procedure: loop ILEOSTOMY TAKEDOWN examination of anesthesia ;  Surgeon: Adin Hector, MD;  Location: [facility];  Service: General;  Laterality: N/A;   INGUINAL HERNIA REPAIR Left    INGUINAL HERNIA REPAIR Bilateral 08/13/2015   Procedure: LAPAROSCOPIC BILATERAL INGUINAL HERNIA REPAIR WITH MESH;  Surgeon: Michael Boston, MD;  Location: [facility];  Service: General;  Laterality: Bilateral;   INSERTION OF MESH Bilateral 08/13/2015   Procedure: INSERTION OF MESH;  Surgeon: Michael Boston, MD;  Location: [facility];  Service: General;  Laterality: Bilateral;   LAPAROSCOPIC LOW ANTERIOR RESCECTION WITH COLOANAL ANASTOMOSIS N/A 12/27/2012   Procedure: LAPAROSCOPIC LOW ANTERIOR RESCECTION WITH COLOANAL ANASTOMOSIS;  Surgeon: Adin Hector, MD;  Location: [facility];  Service: General;  Laterality: N/A;    LAPAROSCOPIC LOW ANTERIOR RESECTION N/A 12/27/2012   Procedure: LAPAROSCOPIC LOW ANTERIOR RESECTION, COLO-ANAL ANASTOMOSIS, DIVERTING LOOP ILEOSTOMY,SPLENIC FLEXURE MOBILIZATION, PRIMARY INCARCERATED UMBILICAL HERNIA REPAIR;  Surgeon: Adin Hector, MD;  Location: [facility];  Service: General;  Laterality: N/A;   TEE WITHOUT CARDIOVERSION N/A 07/10/2013   Procedure: TRANSESOPHAGEAL ECHOCARDIOGRAM (TEE);  Surgeon: Candee Furbish, MD;  Location[facility];  Service: Cardiovascular;  Laterality: N/A;   TEE WITHOUT CARDIOVERSION N/A 08/21/2013   Procedure: TRANSESOPHAGEAL ECHOCARDIOGRAM (TEE);  Surgeon: Sanda Klein, MD;  Location: [facility];  Service: Cardiovascular;  Laterality: N/A;   TEE WITHOUT CARDIOVERSION N/A 07/16/2019   Procedure: TRANSESOPHAGEAL ECHOCARDIOGRAM (TEE) WITH PROPOFOL;  Surgeon: Arnoldo Lenis, MD;  Location: [facility];  Service: Endoscopy;  Laterality: N/A;   TONSILLECTOMY     as child   UMBILICAL HERNIA REPAIR  12/27/2012   Procedure: PRIMARY REPAIR INCARCERATED UMBILICAL HERNIA;  Surgeon: Remo Lipps  C. Gross, MD;  Location: [facility];  Service: General;;   Family History  Problem Relation Age of Onset   Cancer Mother        abdominal ca ?ovarian?   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  Has two adult children and one grandchild. One son lives with him.    Social Determinants of Health   Financial Resource Strain: Not on file  Food Insecurity: Not on file  Transportation Needs: Not on file  Physical Activity: Insufficiently Active   Days of Exercise per Week: 7 days   Minutes of Exercise per Session: 20 min  Stress: No Stress Concern Present   Feeling of Stress : Only a little  Social Connections: Moderately Isolated   Frequency of Communication with Friends and Family: More than three times a week   Frequency of Social Gatherings with Friends and Family: More than three times a week   Attends Religious Services: 1 to 4 times per year   Active Member of [employer] or Organizations: No   Attends Archivist Meetings: Never   Marital Status: Widowed    Tobacco Counseling Counseling given: Not Answered   Clinical Intake:  Pre-visit preparation completed: Yes  Pain : No/denies pain     BMI - recorded: 27.26 Nutritional Status: BMI 25 -29 Overweight Nutritional Risks: None Diabetes: No  How often do you need to have someone help you when you read instructions, pamphlets, or other written materials from your doctor or pharmacy?: 1 - Never  Diabetic? No  Interpreter Needed?: No  Information entered by :: Khiry Pasquariello, LPN   Activities of Daily Living In your present state of health, do you have any difficulty performing the following activities: 11/26/2020  Hearing? Y  Vision? N  Difficulty concentrating or making decisions? Y  Walking or climbing stairs? N  Dressing or bathing? N  Doing errands, shopping? N  Preparing Food and eating ? N  Using the Toilet? N  In the past six months, have you accidently leaked urine? N  Do you have problems with loss of bowel control? N  Managing your Medications?  N  Managing your Finances? N  Housekeeping or managing your Housekeeping? N  Some recent data might be hidden    Patient Care Team: Janora Norlander, DO as PCP - General (Family Medicine) Harl Bowie Alphonse Guild, MD as  PCP - Cardiology (Cardiology) Michael Boston, MD as Consulting Physician (Colon and Rectal Surgery) Wilford Corner, MD as Consulting Physician (Gastroenterology) [name], MD as Consulting Physician (Oncology) Harlen Labs, MD as Referring Physician (Optometry)  Indicate any recent Medical Services you may have received from other than [facility] providers in the past year (date may be approximate).     Assessment:   This is a routine wellness examination for Delrae Ashley.  Hearing/Vision screen Hearing Screening - Comments:: C/o mild hearing difficulties - declines hearing aids Vision Screening - Comments:: Wears eyeglasses - up to date with annual eye exams with Dr Marin Comment in [place]   Dietary issues and exercise activities discussed: Current Exercise Habits: Home exercise routine, Type of exercise: walking;Other - see comments (working in the yard, caring for [employer], Social research officer, government), Time (Minutes): 20, Frequency (Times/Week): 7, Weekly Exercise (Minutes/Week): 140, Intensity: Mild, Exercise limited by: orthopedic condition(s);cardiac condition(s)   Goals Addressed             This Visit's Progress    Prevent falls   On track    Move carefully to avoid falls        Depression Screen PHQ 2/9 Scores 11/26/2020 11/24/2020 05/26/2020 12/19/2019 12/16/2019 11/26/2019 11/25/2019  PHQ - 2 Score 0 0 0 0 0 0 0  PHQ- 9 Score 0 0 - - - - 2    Fall Risk Fall Risk  11/26/2020 11/24/2020 05/26/2020 12/19/2019 12/16/2019  Falls in the past year? 0 0 0 0 0  Number falls in past yr: 0 - - - -  Injury with Fall? 0 - - - -  Risk for fall due to : Impaired vision;Orthopedic patient - - - -  Follow up Falls prevention discussed Falls evaluation completed - - -    FALL RISK PREVENTION  PERTAINING TO THE HOME:  Any stairs in or around the home? Yes  If so, are there any without handrails? Yes  few steps coming into house - no hand rail - he will consider installing Home free of loose throw rugs in walkways, pet beds, electrical cords, etc? Yes  Adequate lighting in your home to reduce risk of falls? Yes   ASSISTIVE DEVICES UTILIZED TO PREVENT FALLS:  Life alert? No  Use of a cane, walker or w/c? No  Grab bars in the bathroom? Yes  Shower chair or bench in shower? No  Elevated toilet seat or a handicapped toilet? No   TIMED UP AND GO:  Was the test performed? No . Telephonic visit  Cognitive Function: MMSE - Mini Mental State Exam 11/13/2017 11/13/2017 11/11/2016  Orientation to time 5 5 5   Orientation to Place 5 5 5   Registration 3 - 3  Attention/ Calculation 0 - 4  Attention/Calculation-comments didn't attempt. Was able to spell forward - -  Recall 3 - 1  Language- name 2 objects 2 - 2  Language- repeat 1 - 1  Language- follow 3 step command 3 - 3  Language- read & follow direction 1 - 1  Write a sentence 1 - 1  Copy design 1 - 1  Total score 25 - 27     6CIT Screen 11/26/2020 11/26/2019 11/19/2018  What Year? 0 points 0 points 0 points  What month? 0 points 0 points 0 points  What time? 0 points 0 points 0 points  Count back from 20 0 points 0 points 0 points  Months in reverse 0 points 2 points 0 points  Repeat phrase 2  points 0 points 0 points  Total Score 2 2 0    Immunizations Immunization History  Administered Date(s) Administered   Fluad Quad(high Dose 65+) 03/21/2019, 03/25/2020   Influenza Inj Mdck Quad Pf 03/21/2019   Influenza, High Dose Seasonal PF 03/16/2018   Influenza,inj,Quad PF,6+ Mos 05/21/2013, 03/13/2014, 03/10/2015, 03/14/2016, 03/08/2017   Moderna Sars-Covid-2 Vaccination 03/25/2020, 04/29/2020, 11/24/2020   Pneumococcal Conjugate-13 06/06/2014   Pneumococcal Polysaccharide-23 05/21/2013   Tdap 11/24/2020    TDAP status: Up  to date  Flu Vaccine status: Up to date  Pneumococcal vaccine status: Up to date  Covid-19 vaccine status: Completed vaccines  Qualifies for Shingles Vaccine? Yes   Zostavax completed Yes   Shingrix Completed?: No.    Education has been provided regarding the importance of this vaccine. Patient has been advised to call insurance company to determine out of pocket expense if they have not yet received this vaccine. Advised may also receive vaccine at local pharmacy or Health Dept. Verbalized acceptance and understanding.  Screening Tests Health Maintenance  Topic Date Due   Zoster Vaccines- Shingrix (1 of 2) Never done   INFLUENZA VACCINE  01/04/2021   COVID-19 Vaccine (4 - Booster for Moderna series) 02/24/2021   TETANUS/TDAP  11/25/2030   Hepatitis C Screening  Completed   PNA vac Low Risk Adult  Completed   HPV VACCINES  Aged Out    Health Maintenance  Health Maintenance Due  Topic Date Due   Zoster Vaccines- Shingrix (1 of 2) Never done    Colorectal cancer screening: No longer required.   Lung Cancer Screening: (Low Dose CT Chest recommended if Age 77-80 years, 30 pack-year currently smoking OR have quit w/in 15years.) does not qualify.   Additional Screening:  Hepatitis C Screening: does qualify; Completed 05/22/2018  Vision Screening: Recommended annual ophthalmology exams for early detection of glaucoma and other disorders of the eye. Is the patient up to date with their annual eye exam?  Yes  Who is the provider or what is the name of the office in which the patient attends annual eye exams? Dr Marin Comment in [place] If pt is not established with a provider, would they like to be referred to a provider to establish care? No .   Dental Screening: Recommended annual dental exams for proper oral hygiene  Community Resource Referral / Chronic Care Management: CRR required this visit?  No   CCM required this visit?  No      Plan:     I have personally reviewed and  noted the following in the patient's chart:   Medical and social history Use of alcohol, tobacco or illicit drugs  Current medications and supplements including opioid prescriptions. Patient is not currently taking opioid prescriptions. Functional ability and status Nutritional status Physical activity Advanced directives List of other physicians Hospitalizations, surgeries, and ER visits in previous 12 months Vitals Screenings to include cognitive, depression, and falls Referrals and appointments  In addition, I have reviewed and discussed with patient certain preventive protocols, quality metrics, and best practice recommendations. A written personalized care plan for preventive services as well as general preventive health recommendations were provided to patient.     Sandrea Hammond, LPN   [id number]   Nurse Notes: None

## 2020-11-26 NOTE — Patient Instructions (Signed)
[name] , Thank you for taking time to come for your Medicare Wellness Visit. I appreciate your ongoing commitment to your health goals. Please review the following plan we discussed and let me know if I can assist you in the future.   Screening recommendations/referrals: Colonoscopy: Done 02/23/2017 - Likely no repeat required Recommended yearly ophthalmology/optometry visit for glaucoma screening and checkup Recommended yearly dental visit for hygiene and checkup  Vaccinations: Influenza vaccine: Done 03/25/2020 - Repeat annually Pneumococcal vaccine: Done 05/21/2013 & 2016 when in hospital Tdap vaccine: Done 11/24/20 - Repeat in 10 years Shingles vaccine: Due Shingrix discussed. Please contact your pharmacy for coverage information.    Covid-19: Done 03/25/2020, 04/29/2020, & 11/24/2020  Advanced directives: Advance directive discussed with you today. Even though you declined this today, please call our office should you change your mind, and we can give you the proper paperwork for you to fill out.  Conditions/risks identified: Aim for 30 minutes of exercise or brisk walking each day, drink 6-8 glasses of water and eat lots of fruits and vegetables.  Next appointment: Follow up in one year for your annual wellness visit.   Preventive Care 77 Years and Older, Male  Preventive care refers to lifestyle choices and visits with your health care provider that can promote health and wellness. What does preventive care include? A yearly physical exam. This is also called an annual well check. Dental exams once or twice a year. Routine eye exams. Ask your health care provider how often you should have your eyes checked. Personal lifestyle choices, including: Daily care of your teeth and gums. Regular physical activity. Eating a healthy diet. Avoiding tobacco and drug use. Limiting alcohol use. Practicing safe sex. Taking low doses of aspirin every day. Taking vitamin and mineral  supplements as recommended by your health care provider. What happens during an annual well check? The services and screenings done by your health care provider during your annual well check will depend on your age, overall health, lifestyle risk factors, and family history of disease. Counseling  Your health care provider may ask you questions about your: Alcohol use. Tobacco use. Drug use. Emotional well-being. Home and relationship well-being. Sexual activity. Eating habits. History of falls. Memory and ability to understand (cognition). Work and work Statistician. Screening  You may have the following tests or measurements: Height, weight, and BMI. Blood pressure. Lipid and cholesterol levels. These may be checked every 5 years, or more frequently if you are over 46 years old. Skin check. Lung cancer screening. You may have this screening every year starting at age 20 if you have a 30-pack-year history of smoking and currently smoke or have quit within the past 15 years. Fecal occult blood test (FOBT) of the stool. You may have this test every year starting at age 43. Flexible sigmoidoscopy or colonoscopy. You may have a sigmoidoscopy every 5 years or a colonoscopy every 10 years starting at age 53. Prostate cancer screening. Recommendations will vary depending on your family history and other risks. Hepatitis C blood test. Hepatitis B blood test. Sexually transmitted disease (STD) testing. Diabetes screening. This is done by checking your blood sugar (glucose) after you have not eaten for a while (fasting). You may have this done every 1-3 years. Abdominal aortic aneurysm (AAA) screening. You may need this if you are a current or former smoker. Osteoporosis. You may be screened starting at age 74 if you are at high risk. Talk with your health care provider about your  test results, treatment options, and if necessary, the need for more tests. Vaccines  Your health care provider  may recommend certain vaccines, such as: Influenza vaccine. This is recommended every year. Tetanus, diphtheria, and acellular pertussis (Tdap, Td) vaccine. You may need a Td booster every 10 years. Zoster vaccine. You may need this after age 96. Pneumococcal 13-valent conjugate (PCV13) vaccine. One dose is recommended after age 86. Pneumococcal polysaccharide (PPSV23) vaccine. One dose is recommended after age 34. Talk to your health care provider about which screenings and vaccines you need and how often you need them. This information is not intended to replace advice given to you by your health care provider. Make sure you discuss any questions you have with your health care provider. Document Released: 06/19/2015 Document Revised: 02/10/2016 Document Reviewed: 03/24/2015 Elsevier Interactive Patient Education  2017 [employer] Prevention in the Home Falls can cause injuries. They can happen to people of all ages. There are many things you can do to make your home safe and to help prevent falls. What can I do on the outside of my home? Regularly fix the edges of walkways and driveways and fix any cracks. Remove anything that might make you trip as you walk through a door, such as a raised step or threshold. Trim any bushes or trees on the path to your home. Use bright outdoor lighting. Clear any walking paths of anything that might make someone trip, such as rocks or tools. Regularly check to see if handrails are loose or broken. Make sure that both sides of any steps have handrails. Any raised decks and porches should have guardrails on the edges. Have any leaves, snow, or ice cleared regularly. Use sand or salt on walking paths during winter. Clean up any spills in your garage right away. This includes oil or grease spills. What can I do in the bathroom? Use night lights. Install grab bars by the toilet and in the tub and shower. Do not use towel bars as grab bars. Use  non-skid mats or decals in the tub or shower. If you need to sit down in the shower, use a plastic, non-slip stool. Keep the floor dry. Clean up any water that spills on the floor as soon as it happens. Remove soap buildup in the tub or shower regularly. Attach bath mats securely with double-sided non-slip rug tape. Do not have throw rugs and other things on the floor that can make you trip. What can I do in the bedroom? Use night lights. Make sure that you have a light by your bed that is easy to reach. Do not use any sheets or blankets that are too big for your bed. They should not hang down onto the floor. Have a firm chair that has side arms. You can use this for support while you get dressed. Do not have throw rugs and other things on the floor that can make you trip. What can I do in the kitchen? Clean up any spills right away. Avoid walking on wet floors. Keep items that you use a lot in easy-to-reach places. If you need to reach something above you, use a strong step stool that has a grab bar. Keep electrical cords out of the way. Do not use floor polish or wax that makes floors slippery. If you must use wax, use non-skid floor wax. Do not have throw rugs and other things on the floor that can make you trip. What can I do with my  stairs? Do not leave any items on the stairs. Make sure that there are handrails on both sides of the stairs and use them. Fix handrails that are broken or loose. Make sure that handrails are as long as the stairways. Check any carpeting to make sure that it is firmly attached to the stairs. Fix any carpet that is loose or worn. Avoid having throw rugs at the top or bottom of the stairs. If you do have throw rugs, attach them to the floor with carpet tape. Make sure that you have a light switch at the top of the stairs and the bottom of the stairs. If you do not have them, ask someone to add them for you. What else can I do to help prevent falls? Wear  shoes that: Do not have high heels. Have rubber bottoms. Are comfortable and fit you well. Are closed at the toe. Do not wear sandals. If you use a stepladder: Make sure that it is fully opened. Do not climb a closed stepladder. Make sure that both sides of the stepladder are locked into place. Ask someone to hold it for you, if possible. Clearly mark and make sure that you can see: Any grab bars or handrails. First and last steps. Where the edge of each step is. Use tools that help you move around (mobility aids) if they are needed. These include: Canes. Walkers. Scooters. Crutches. Turn on the lights when you go into a dark area. Replace any light bulbs as soon as they burn out. Set up your furniture so you have a clear path. Avoid moving your furniture around. If any of your floors are uneven, fix them. If there are any pets around you, be aware of where they are. Review your medicines with your doctor. Some medicines can make you feel dizzy. This can increase your chance of falling. Ask your doctor what other things that you can do to help prevent falls. This information is not intended to replace advice given to you by your health care provider. Make sure you discuss any questions you have with your health care provider. Document Released: 03/19/2009 Document Revised: 10/29/2015 Document Reviewed: 06/27/2014 Elsevier Interactive Patient Education  2017 [employer].

## 2020-11-30 ENCOUNTER — Telehealth: Payer: Self-pay | Admitting: Family Medicine

## 2020-11-30 DIAGNOSIS — E785 Hyperlipidemia, unspecified: Secondary | ICD-10-CM

## 2020-11-30 MED ORDER — PRAVASTATIN SODIUM 20 MG PO TABS: 20.0000 mg | ORAL_TABLET | Freq: Every day | ORAL | 3 refills | Status: DC

## 2020-11-30 NOTE — Telephone Encounter (Signed)
  Prescription Request  11/30/2020  What is the name of the medication or equipment? pravastatin (PRAVACHOL) 20 MG tablet  Have you contacted your pharmacy to request a refill? (if applicable) yes  Which pharmacy would you like this sent to? [facility]   Patient notified that their request is being sent to the clinical staff for review and that they should receive a response within 2 business days.

## 2020-11-30 NOTE — Telephone Encounter (Addendum)
Rx sent to pharmacy and patient aware.  

## 2020-12-23 ENCOUNTER — Ambulatory Visit: Payer: PPO | Admitting: Cardiology

## 2020-12-23 NOTE — Progress Notes (Deleted)
Clinical Summary Tyler Diaz is a 75 y.o.maleseen today for follow up of the following medical problems.      1. Mitral regurgitation - 11/2017 echo LVEF 60%, moderate to severe MR. Partial flail cusp of posterior leaflet. LVIDs 3.6 - 08/2013 TEE flail segment of P2 scallop posterior leaflet due to ruptured chordae, moderate to severe MR   03/2019 TTE There is posterior leaflet prolapse and  a calcified density on the atrial side of the posterior leaflet that could  represent a partial flail subsegment of the posterior leaflet or perhaps  an old vegetation. There is  eccentric, anteriorly directed, moderate to severe mitral valve  Regurgitation     07/2019 TEE: Ruptured chordae attached to the posterior leaflet with  partial prolapse of P2. Chronic finding when compared to 07/2013 TEE. There  is eccentric anterior directed MR thats difficult to quantify due to  eccentricity, appears moderate to  severe.       - no SOB/DOE, no LE edema     2. HTN  - compliant with meds   3. Hyperlipidemia -6/2021TC 140 TG 163 HDL 35 LDL 77  compliant with statin   4. Colorectal cancer - s/p surgery, currently cancer free.      5. Recent Sepsis/Bacteremia - recent admission 07/2019 with sepsis and staph aureus bacteremia - TEE without signs of endocarditis   Past Medical History:  Diagnosis Date   Anemia    hx of   Arthritis    hands/knees   Blood clot in vein    RT ARM WITH PICC LINE    C. difficile colitis 08/19/2013   Colon cancer ([place]) 07/18/12 bx   rectum=Invasive adenocarcinoma w/ extracellular mucin,polyp=benign   GERD (gastroesophageal reflux disease)    OCCASIONAL   Hearing loss    partial greater on left   Heart murmur    Hematochezia    recent   Herpes zoster - R flank - with severe pain 03/11/2013   History of shingles    Hyperlipidemia    Hypertension    Hypothyroidism as child   hx of   Ileostomy - diverting loop - s/p takedown 06/28/2013 01/14/2013   Mitral  regurgitation due to partially flail posterior mitral valve leaflet    Nocturia    Numbness    TOES / FINGERS DUE TO CHEMO   Peripheral vascular disease (HCC)    neuropathy due to cancer treatment (fingers and toes)   Radiation    FOR COLON CANCER   Rectal cancer (HCC)    Rheumatic fever as child   hx   Sinusitis    seasonal   Tinnitus    left ear   Umbilical hernia, incarcerated - s/p primary repair July 2014 01/03/2013     Allergies  Allergen Reactions   Shrimp [Shellfish Allergy] Hives, Nausea And Vomiting and Rash    "only shrimp" - crustaceans - blisters, n/v      Current Outpatient Medications  Medication Sig Dispense Refill   acetaminophen (TYLENOL) 500 MG tablet Take 1,000 mg by mouth every 6 (six) hours as needed for headache.      clotrimazole-betamethasone (LOTRISONE) cream Apply 1 application topically 2 (two) times daily. To neck for rash for up to 2 weeks. 30 g 0   diclofenac Sodium (VOLTAREN) 1 % GEL APPLY 4 GRAMS TOPICALLY 4 TIMES DAILY FOR JOINT PAIN 400 g 0   hydrochlorothiazide (HYDRODIURIL) 25 MG tablet TAKE 1 TABLET BY MOUTH ONCE DAILY. STOPPING CHLORTHALIDONE 90 tablet  3   ketoconazole (NIZORAL) 2 % cream APPLY TOPICALLY TWICE DAILY (Patient taking differently: Apply 1 application topically 2 (two) times daily.) 60 g 0   lisinopril (ZESTRIL) 40 MG tablet Take 1 tablet (40 mg total) by mouth daily. 90 tablet 3   Multiple Vitamin (MULTIVITAMIN WITH MINERALS) TABS Take 1 tablet by mouth every morning.      Olopatadine HCl 0.2 % SOLN Instill 1 drop in each eye ONCE daily. (Patient not taking: Reported on 11/24/2020) 2.5 mL 12   oxymetazoline (AFRIN) 0.05 % nasal spray Place 1 spray into both nostrils 2 (two) times daily as needed for congestion.     pravastatin (PRAVACHOL) 20 MG tablet Take 1 tablet (20 mg total) by mouth daily. 90 tablet 3   Probiotic Product (DIGESTIVE ADVANTAGE PO) Take 1 tablet by mouth daily.     PROCTO-MED HC 2.5 % rectal cream INSERT 1  APPLICATORFUL RECTALLY TWICE DAILY AS NEEDED FOR HEMORRHOIDS OR ANAL ITCHING FOR 7-10 DAYS 28 g 0   psyllium (METAMUCIL) 58.6 % packet Take 1 packet by mouth daily.     triamcinolone ointment (KENALOG) 0.1 % APPLY  OINTMENT TOPICALLY TO AFFECTED AREA THREE TIMES DAILY 30 g 0   No current facility-administered medications for this visit.     Past Surgical History:  Procedure Laterality Date   CARPAL TUNNEL RELEASE Right 2002   ILEOSTOMY N/A 12/27/2012   Procedure: DIVERTING LOOP ILEOSTOMY;  Surgeon: Adin Hector, MD;  Location: [facility];  Service: General;  Laterality: N/A;   ILEOSTOMY CLOSURE N/A 06/28/2013   Procedure: loop ILEOSTOMY TAKEDOWN examination of anesthesia ;  Surgeon: Adin Hector, MD;  Location: [facility];  Service: General;  Laterality: N/A;   INGUINAL HERNIA REPAIR Left    INGUINAL HERNIA REPAIR Bilateral 08/13/2015   Procedure: LAPAROSCOPIC BILATERAL INGUINAL HERNIA REPAIR WITH MESH;  Surgeon: Michael Boston, MD;  Location: [facility];  Service: General;  Laterality: Bilateral;   INSERTION OF MESH Bilateral 08/13/2015   Procedure: INSERTION OF MESH;  Surgeon: Michael Boston, MD;  Location: [facility];  Service: General;  Laterality: Bilateral;   LAPAROSCOPIC LOW ANTERIOR RESCECTION WITH COLOANAL ANASTOMOSIS N/A 12/27/2012   Procedure: LAPAROSCOPIC LOW ANTERIOR RESCECTION WITH COLOANAL ANASTOMOSIS;  Surgeon: Adin Hector, MD;  Location: [facility];  Service: General;  Laterality: N/A;   LAPAROSCOPIC LOW ANTERIOR RESECTION N/A 12/27/2012   Procedure: LAPAROSCOPIC LOW ANTERIOR RESECTION, COLO-ANAL ANASTOMOSIS, DIVERTING LOOP ILEOSTOMY,SPLENIC FLEXURE MOBILIZATION, PRIMARY INCARCERATED UMBILICAL HERNIA REPAIR;  Surgeon: Adin Hector, MD;  Location: [facility];  Service: General;  Laterality: N/A;   TEE WITHOUT CARDIOVERSION N/A 07/10/2013   Procedure: TRANSESOPHAGEAL ECHOCARDIOGRAM (TEE);  Surgeon: Candee Furbish, MD;  Location: [facility] ENDOSCOPY;  Service: Cardiovascular;  Laterality: N/A;   TEE WITHOUT  CARDIOVERSION N/A 08/21/2013   Procedure: TRANSESOPHAGEAL ECHOCARDIOGRAM (TEE);  Surgeon: Sanda Klein, MD;  Location: [facility];  Service: Cardiovascular;  Laterality: N/A;   TEE WITHOUT CARDIOVERSION N/A 07/16/2019   Procedure: TRANSESOPHAGEAL ECHOCARDIOGRAM (TEE) WITH PROPOFOL;  Surgeon: Arnoldo Lenis, MD;  Location: [facility];  Service: Endoscopy;  Laterality: N/A;   TONSILLECTOMY     as child   UMBILICAL HERNIA REPAIR  12/27/2012   Procedure: PRIMARY REPAIR INCARCERATED UMBILICAL HERNIA;  Surgeon: Adin Hector, MD;  Location: [facility];  Service: General;;     Allergies  Allergen Reactions   Shrimp [Shellfish Allergy] Hives, Nausea And Vomiting and Rash    "only shrimp" - crustaceans - blisters, n/v  Family History  Problem Relation Age of Onset   Cancer Mother        abdominal ca ?ovarian?   Heart disease Father    Cancer Sister        liver cancer   Heart disease Brother    COPD Brother    Healthy Son    Healthy Son      Social History Mr. Donaghue reports that he quit smoking about 48 years ago. His smoking use included cigarettes. He has a 30.00 pack-year smoking history. He has never used smokeless tobacco. Mr. Rockwell reports no history of alcohol use.   Review of Systems CONSTITUTIONAL: No weight loss, fever, chills, weakness or fatigue.  HEENT: Eyes: No visual loss, blurred vision, double vision or yellow sclerae.No hearing loss, sneezing, congestion, runny nose or sore throat.  SKIN: No rash or itching.  CARDIOVASCULAR:  RESPIRATORY: No shortness of breath, cough or sputum.  GASTROINTESTINAL: No anorexia, nausea, vomiting or diarrhea. No abdominal pain or blood.  GENITOURINARY: No burning on urination, no polyuria NEUROLOGICAL: No headache, dizziness, syncope, paralysis, ataxia, numbness or tingling in the extremities. No change in bowel or bladder control.  MUSCULOSKELETAL: No muscle, back pain, joint pain or stiffness.  LYMPHATICS: No enlarged nodes.  No history of splenectomy.  PSYCHIATRIC: No history of depression or anxiety.  ENDOCRINOLOGIC: No reports of sweating, cold or heat intolerance. No polyuria or polydipsia.  Marland Kitchen   Physical Examination There were no vitals filed for this visit. There were no vitals filed for this visit.  Gen: resting comfortably, no acute distress HEENT: no scleral icterus, pupils equal round and reactive, no palptable cervical adenopathy,  CV Resp: Clear to auscultation bilaterally GI: abdomen is soft, non-tender, non-distended, normal bowel sounds, no hepatosplenomegaly MSK: extremities are warm, no edema.  Skin: warm, no rash Neuro:  no focal deficits Psych: appropriate affect   Diagnostic Studies 03/2019 echo IMPRESSIONS     1. Left ventricular ejection fraction, by visual estimation, is 55 to  60%. The left ventricle has normal function. Normal left ventricular size.  There is mildly increased left ventricular hypertrophy.   2. Global right ventricle has normal systolic function.The right  ventricular size is normal. No increase in right ventricular wall  thickness.   3. Left atrial size was mildly dilated.   4. Right atrial size was normal.   5. Mild aortic valve annular calcification.   6. The mitral valve is abnormal. There is posterior leaflet prolapse and  a calcified density on the atrial side of the posterior leaflet that could  represent a partial flail subsegment of the posterior leaflet or perhaps  an old vegetation. There is  eccentric, anteriorly directed, moderate to severe mitral valve  regurgitation. PISA calculations are challenging with very eccentric jet,  but one image suggests MR radius is at least 1 cm which is consistent with  severe regurgitation. Suggest TEE for  further evaluation if clinically indicated.   7. The tricuspid valve is grossly normal. Tricuspid valve regurgitation  is mild.   8. The aortic valve is tricuspid Aortic valve regurgitation is trivial by   color flow Doppler.   9. The pulmonic valve was grossly normal. Pulmonic valve regurgitation is  mild by color flow Doppler.  10. Normal pulmonary artery systolic pressure.  11. The tricuspid regurgitant velocity is 2.36 m/s, and with an assumed  right atrial pressure of 3 mmHg, the estimated right ventricular systolic  pressure is normal at 25.3 mmHg.  12. The inferior  vena cava is normal in size with greater than 50%  respiratory variability, suggesting right atrial pressure of 3 mmHg.   In comparison to the previous echocardiogram(s): Previous Echo at [facility]  showed LV EF 60%, mild LAE, moderate to severe MR with a wall-impinging MR  jet and a flail cusp of the posterior leaflet. Mild MR, PI and dilated  IVC. These images were not able  to be viewed directly.  FINDINGS   Left Ventricle: Left ventricular ejection fraction, by visual estimation,  is 55 to 60%. The left ventricle has normal function. There is mildly  increased left ventricular hypertrophy. Normal left ventricular size.  Spectral Doppler shows Left ventricular   diastolic parameters were normal pattern of LV diastolic filling.      10/6431 TEE IMPRESSIONS     1. Left ventricular ejection fraction, by estimation, is 60 to 65%. The  left ventricle has normal function. The left ventrical has no regional  wall motion abnormalities. Left ventricular diastolic function could not  be evaluated.   2. Right ventricular systolic function is normal. The right ventricular  size is normal.   3. Left atrial size was moderately dilated. No left atrial/left atrial  appendage thrombus was detected.   4. Right atrial size was moderately dilated.   5. Ruptured chordae attached to the posterior leaflet with partial  prolapse of P2. Chronic finding when compared to 07/2013 TEE. There is  eccentric anterior directed MR thats difficult to quantify due to  eccentricity, appears moderate to severe. . The  mitral valve is abnormal.  Moderate to severe MR mitral valve  regurgitation. No evidence of mitral stenosis.   6. Tricuspid valve regurgitation is mild to moderate.   7. The aortic valve is tricuspid. Aortic valve regurgitation is trivial .  No aortic stenosis is present.    Assessment and Plan  1. Mitral regurgitation - moderate to severe. Asymptomatic, no evidence of LV enlargement or dysfunction - we will continue to monitor, repeat echo after next visit.      2. HTN - he prefers HCTZ instead of chlorthaldone due to cost - bp at goal, continue current meds   3. Hyperlipidemia -overall at goal, continue statin        Arnoldo Lenis, M.D., F.A.C.C.

## 2020-12-24 ENCOUNTER — Encounter: Payer: Self-pay | Admitting: Cardiology

## 2020-12-24 ENCOUNTER — Ambulatory Visit: Payer: PPO | Admitting: Cardiology

## 2020-12-24 VITALS — BP 140/90 | HR 76 | Ht 70.0 in | Wt 188.0 lb

## 2020-12-24 DIAGNOSIS — I10 Essential (primary) hypertension: Secondary | ICD-10-CM

## 2020-12-24 DIAGNOSIS — I340 Nonrheumatic mitral (valve) insufficiency: Secondary | ICD-10-CM | POA: Diagnosis not present

## 2020-12-24 DIAGNOSIS — E782 Mixed hyperlipidemia: Secondary | ICD-10-CM | POA: Diagnosis not present

## 2020-12-24 NOTE — Progress Notes (Signed)
Clinical Summary Tyler Diaz is a 75 y.o.maleseen today for follow up of the following medical problems.      1. Mitral regurgitation - 11/2017 echo LVEF 60%, moderate to severe MR. Partial flail cusp of posterior leaflet. LVIDs 3.6 - 08/2013 TEE flail segment of P2 scallop posterior leaflet due to ruptured chordae, moderate to severe MR   03/2019 TTE There is posterior leaflet prolapse and  a calcified density on the atrial side of the posterior leaflet that could  represent a partial flail subsegment of the posterior leaflet or perhaps  an old vegetation. There is  eccentric, anteriorly directed, moderate to severe mitral valve  Regurgitation     07/2019 TEE: Ruptured chordae attached to the posterior leaflet with  partial prolapse of P2. Chronic finding when compared to 07/2013 TEE. There  is eccentric anterior directed MR thats difficult to quantify due to  eccentricity, appears moderate to  severe.       - no recent SOB/DOE, no LE edema      2. HTN  - compliant with meds  home bp's 110s/70s - recent pcp visit 130/80 -reports some white coat HTN.    3. Hyperlipidemia  11/2020 TC 140 TG 137 HDL 40 LDL 76 - he is on pravatatin   4. Colorectal cancer - s/p surgery, currently cancer free.      5. Recent Sepsis/Bacteremia - recent admission 07/2019 with sepsis and staph aureus bacteremia - TEE without signs of endocarditis   Past Medical History:  Diagnosis Date   Anemia    hx of   Arthritis    hands/knees   Blood clot in vein    RT ARM WITH PICC LINE    C. difficile colitis 08/19/2013   Colon cancer ([name]) 07/18/12 bx   rectum=Invasive adenocarcinoma w/ extracellular mucin,polyp=benign   GERD (gastroesophageal reflux disease)    OCCASIONAL   Hearing loss    partial greater on left   Heart murmur    Hematochezia    recent   Herpes zoster - R flank - with severe pain 03/11/2013   History of shingles    Hyperlipidemia    Hypertension    Hypothyroidism as  child   hx of   Ileostomy - diverting loop - s/p takedown 06/28/2013 01/14/2013   Mitral regurgitation due to partially flail posterior mitral valve leaflet    Nocturia    Numbness    TOES / FINGERS DUE TO CHEMO   Peripheral vascular disease (HCC)    neuropathy due to cancer treatment (fingers and toes)   Radiation    FOR COLON CANCER   Rectal cancer (HCC)    Rheumatic fever as child   hx   Sinusitis    seasonal   Tinnitus    left ear   Umbilical hernia, incarcerated - s/p primary repair July 2014 01/03/2013     Allergies  Allergen Reactions   Shrimp [Shellfish Allergy] Hives, Nausea And Vomiting and Rash    "only shrimp" - crustaceans - blisters, n/v      Current Outpatient Medications  Medication Sig Dispense Refill   acetaminophen (TYLENOL) 500 MG tablet Take 1,000 mg by mouth every 6 (six) hours as needed for headache.      clotrimazole-betamethasone (LOTRISONE) cream Apply 1 application topically 2 (two) times daily. To neck for rash for up to 2 weeks. 30 g 0   diclofenac Sodium (VOLTAREN) 1 % GEL APPLY 4 GRAMS TOPICALLY 4 TIMES DAILY FOR JOINT PAIN 400  g 0   hydrochlorothiazide (HYDRODIURIL) 25 MG tablet TAKE 1 TABLET BY MOUTH ONCE DAILY. STOPPING CHLORTHALIDONE 90 tablet 3   ketoconazole (NIZORAL) 2 % cream APPLY TOPICALLY TWICE DAILY (Patient taking differently: Apply 1 application topically 2 (two) times daily.) 60 g 0   lisinopril (ZESTRIL) 40 MG tablet Take 1 tablet (40 mg total) by mouth daily. 90 tablet 3   Multiple Vitamin (MULTIVITAMIN WITH MINERALS) TABS Take 1 tablet by mouth every morning.      Olopatadine HCl 0.2 % SOLN Instill 1 drop in each eye ONCE daily. (Patient not taking: Reported on 11/24/2020) 2.5 mL 12   oxymetazoline (AFRIN) 0.05 % nasal spray Place 1 spray into both nostrils 2 (two) times daily as needed for congestion.     pravastatin (PRAVACHOL) 20 MG tablet Take 1 tablet (20 mg total) by mouth daily. 90 tablet 3   Probiotic Product (DIGESTIVE  ADVANTAGE PO) Take 1 tablet by mouth daily.     PROCTO-MED HC 2.5 % rectal cream INSERT 1 APPLICATORFUL RECTALLY TWICE DAILY AS NEEDED FOR HEMORRHOIDS OR ANAL ITCHING FOR 7-10 DAYS 28 g 0   psyllium (METAMUCIL) 58.6 % packet Take 1 packet by mouth daily.     triamcinolone ointment (KENALOG) 0.1 % APPLY  OINTMENT TOPICALLY TO AFFECTED AREA THREE TIMES DAILY 30 g 0   No current facility-administered medications for this visit.     Past Surgical History:  Procedure Laterality Date   CARPAL TUNNEL RELEASE Right 2002   ILEOSTOMY N/A 12/27/2012   Procedure: DIVERTING LOOP ILEOSTOMY;  Surgeon: Adin Hector, MD;  Location: [facility];  Service: General;  Laterality: N/A;   ILEOSTOMY CLOSURE N/A 06/28/2013   Procedure: loop ILEOSTOMY TAKEDOWN examination of anesthesia ;  Surgeon: Adin Hector, MD;  Location: [facility];  Service: General;  Laterality: N/A;   INGUINAL HERNIA REPAIR Left    INGUINAL HERNIA REPAIR Bilateral 08/13/2015   Procedure: LAPAROSCOPIC BILATERAL INGUINAL HERNIA REPAIR WITH MESH;  Surgeon: Michael Boston, MD;  Location: [facility];  Service: General;  Laterality: Bilateral;   INSERTION OF MESH Bilateral 08/13/2015   Procedure: INSERTION OF MESH;  Surgeon: Michael Boston, MD;  Location: [facility];  Service: General;  Laterality: Bilateral;   LAPAROSCOPIC LOW ANTERIOR RESCECTION WITH COLOANAL ANASTOMOSIS N/A 12/27/2012   Procedure: LAPAROSCOPIC LOW ANTERIOR RESCECTION WITH COLOANAL ANASTOMOSIS;  Surgeon: Adin Hector, MD;  Location: [facility];  Service: General;  Laterality: N/A;   LAPAROSCOPIC LOW ANTERIOR RESECTION N/A 12/27/2012   Procedure: LAPAROSCOPIC LOW ANTERIOR RESECTION, COLO-ANAL ANASTOMOSIS, DIVERTING LOOP ILEOSTOMY,SPLENIC FLEXURE MOBILIZATION, PRIMARY INCARCERATED UMBILICAL HERNIA REPAIR;  Surgeon: Adin Hector, MD;  Location: [facility];  Service: General;  Laterality: N/A;   TEE WITHOUT CARDIOVERSION N/A 07/10/2013   Procedure: TRANSESOPHAGEAL ECHOCARDIOGRAM (TEE);  Surgeon: Candee Furbish,  MD;  Location: [facility] ENDOSCOPY;  Service: Cardiovascular;  Laterality: N/A;   TEE WITHOUT CARDIOVERSION N/A 08/21/2013   Procedure: TRANSESOPHAGEAL ECHOCARDIOGRAM (TEE);  Surgeon: Sanda Klein, MD;  Location: [facility];  Service: Cardiovascular;  Laterality: N/A;   TEE WITHOUT CARDIOVERSION N/A 07/16/2019   Procedure: TRANSESOPHAGEAL ECHOCARDIOGRAM (TEE) WITH PROPOFOL;  Surgeon: Arnoldo Lenis, MD;  Location: [facility];  Service: Endoscopy;  Laterality: N/A;   TONSILLECTOMY     as child   UMBILICAL HERNIA REPAIR  12/27/2012   Procedure: PRIMARY REPAIR INCARCERATED UMBILICAL HERNIA;  Surgeon: Adin Hector, MD;  Location: [facility];  Service: General;;     Allergies  Allergen Reactions   Shrimp [Shellfish Allergy] Hives,  Nausea And Vomiting and Rash    "only shrimp" - crustaceans - blisters, n/v       Family History  Problem Relation Age of Onset   Cancer Mother        abdominal ca ?ovarian?   Heart disease Father    Cancer Sister        liver cancer   Heart disease Brother    COPD Brother    Healthy Son    Healthy Son      Social History Mr. Recendez reports that he quit smoking about 48 years ago. His smoking use included cigarettes. He has a 30.00 pack-year smoking history. He has never used smokeless tobacco. Mr. Bartel reports no history of alcohol use.   Review of Systems CONSTITUTIONAL: No weight loss, fever, chills, weakness or fatigue.  HEENT: Eyes: No visual loss, blurred vision, double vision or yellow sclerae.No hearing loss, sneezing, congestion, runny nose or sore throat.  SKIN: No rash or itching.  CARDIOVASCULAR: per hpi RESPIRATORY: No shortness of breath, cough or sputum.  GASTROINTESTINAL: No anorexia, nausea, vomiting or diarrhea. No abdominal pain or blood.  GENITOURINARY: No burning on urination, no polyuria NEUROLOGICAL: No headache, dizziness, syncope, paralysis, ataxia, numbness or tingling in the extremities. No change in bowel or bladder control.   MUSCULOSKELETAL: No muscle, back pain, joint pain or stiffness.  LYMPHATICS: No enlarged nodes. No history of splenectomy.  PSYCHIATRIC: No history of depression or anxiety.  ENDOCRINOLOGIC: No reports of sweating, cold or heat intolerance. No polyuria or polydipsia.  Marland Kitchen   Physical Examination Today's Vitals   12/24/20 0957  BP: 140/90  Pulse: 76  SpO2: 97%  Weight: 188 lb (85.3 kg)  Height: 5\' 10"  (1.778 m)   Body mass index is 26.98 kg/m.  Gen: resting comfortably, no acute distress HEENT: no scleral icterus, pupils equal round and reactive, no palptable cervical adenopathy,  CV: RRR, 3/6 systolic murmur apex Resp: Clear to auscultation bilaterally GI: abdomen is soft, non-tender, non-distended, normal bowel sounds, no hepatosplenomegaly MSK: extremities are warm, no edema.  Skin: warm, no rash Neuro:  no focal deficits Psych: appropriate affect   Diagnostic Studies 03/2019 echo IMPRESSIONS     1. Left ventricular ejection fraction, by visual estimation, is 55 to  60%. The left ventricle has normal function. Normal left ventricular size.  There is mildly increased left ventricular hypertrophy.   2. Global right ventricle has normal systolic function.The right  ventricular size is normal. No increase in right ventricular wall  thickness.   3. Left atrial size was mildly dilated.   4. Right atrial size was normal.   5. Mild aortic valve annular calcification.   6. The mitral valve is abnormal. There is posterior leaflet prolapse and  a calcified density on the atrial side of the posterior leaflet that could  represent a partial flail subsegment of the posterior leaflet or perhaps  an old vegetation. There is  eccentric, anteriorly directed, moderate to severe mitral valve  regurgitation. PISA calculations are challenging with very eccentric jet,  but one image suggests MR radius is at least 1 cm which is consistent with  severe regurgitation. Suggest TEE for   further evaluation if clinically indicated.   7. The tricuspid valve is grossly normal. Tricuspid valve regurgitation  is mild.   8. The aortic valve is tricuspid Aortic valve regurgitation is trivial by  color flow Doppler.   9. The pulmonic valve was grossly normal. Pulmonic valve regurgitation is  mild by  color flow Doppler.  10. Normal pulmonary artery systolic pressure.  11. The tricuspid regurgitant velocity is 2.36 m/s, and with an assumed  right atrial pressure of 3 mmHg, the estimated right ventricular systolic  pressure is normal at 25.3 mmHg.  12. The inferior vena cava is normal in size with greater than 50%  respiratory variability, suggesting right atrial pressure of 3 mmHg.   In comparison to the previous echocardiogram(s): Previous Echo at [facility]  showed LV EF 60%, mild LAE, moderate to severe MR with a wall-impinging MR  jet and a flail cusp of the posterior leaflet. Mild MR, PI and dilated  IVC. These images were not able  to be viewed directly.  FINDINGS   Left Ventricle: Left ventricular ejection fraction, by visual estimation,  is 55 to 60%. The left ventricle has normal function. There is mildly  increased left ventricular hypertrophy. Normal left ventricular size.  Spectral Doppler shows Left ventricular   diastolic parameters were normal pattern of LV diastolic filling.      0/3500 TEE IMPRESSIONS     1. Left ventricular ejection fraction, by estimation, is 60 to 65%. The  left ventricle has normal function. The left ventrical has no regional  wall motion abnormalities. Left ventricular diastolic function could not  be evaluated.   2. Right ventricular systolic function is normal. The right ventricular  size is normal.   3. Left atrial size was moderately dilated. No left atrial/left atrial  appendage thrombus was detected.   4. Right atrial size was moderately dilated.   5. Ruptured chordae attached to the posterior leaflet with partial  prolapse  of P2. Chronic finding when compared to 07/2013 TEE. There is  eccentric anterior directed MR thats difficult to quantify due to  eccentricity, appears moderate to severe. . The  mitral valve is abnormal. Moderate to severe MR mitral valve  regurgitation. No evidence of mitral stenosis.   6. Tricuspid valve regurgitation is mild to moderate.   7. The aortic valve is tricuspid. Aortic valve regurgitation is trivial .  No aortic stenosis is present.    Assessment and Plan   1. Mitral regurgitation - moderate to severe. Has been asytmptomatic without evience of cardiac dysfunction - repeat echo     2. HTN - he prefers HCTZ instead of chlorthaldone due to cost - at goal, continue current meds   3. Hyperlipidemia -lipids are at goal, continue pravastatin  EKG today shows NSR  F/u 6 months    Arnoldo Lenis, M.D.

## 2020-12-24 NOTE — Patient Instructions (Signed)

## 2021-01-01 ENCOUNTER — Encounter: Payer: Self-pay | Admitting: *Deleted

## 2021-02-21 IMAGING — DX DG CHEST 1V PORT
1 series · 1 of 1 positions shown · non-contrast
Comparison: 07/11/2019

CLINICAL DATA: Sepsis

EXAM:
PORTABLE CHEST 1 VIEW

[chest ap]
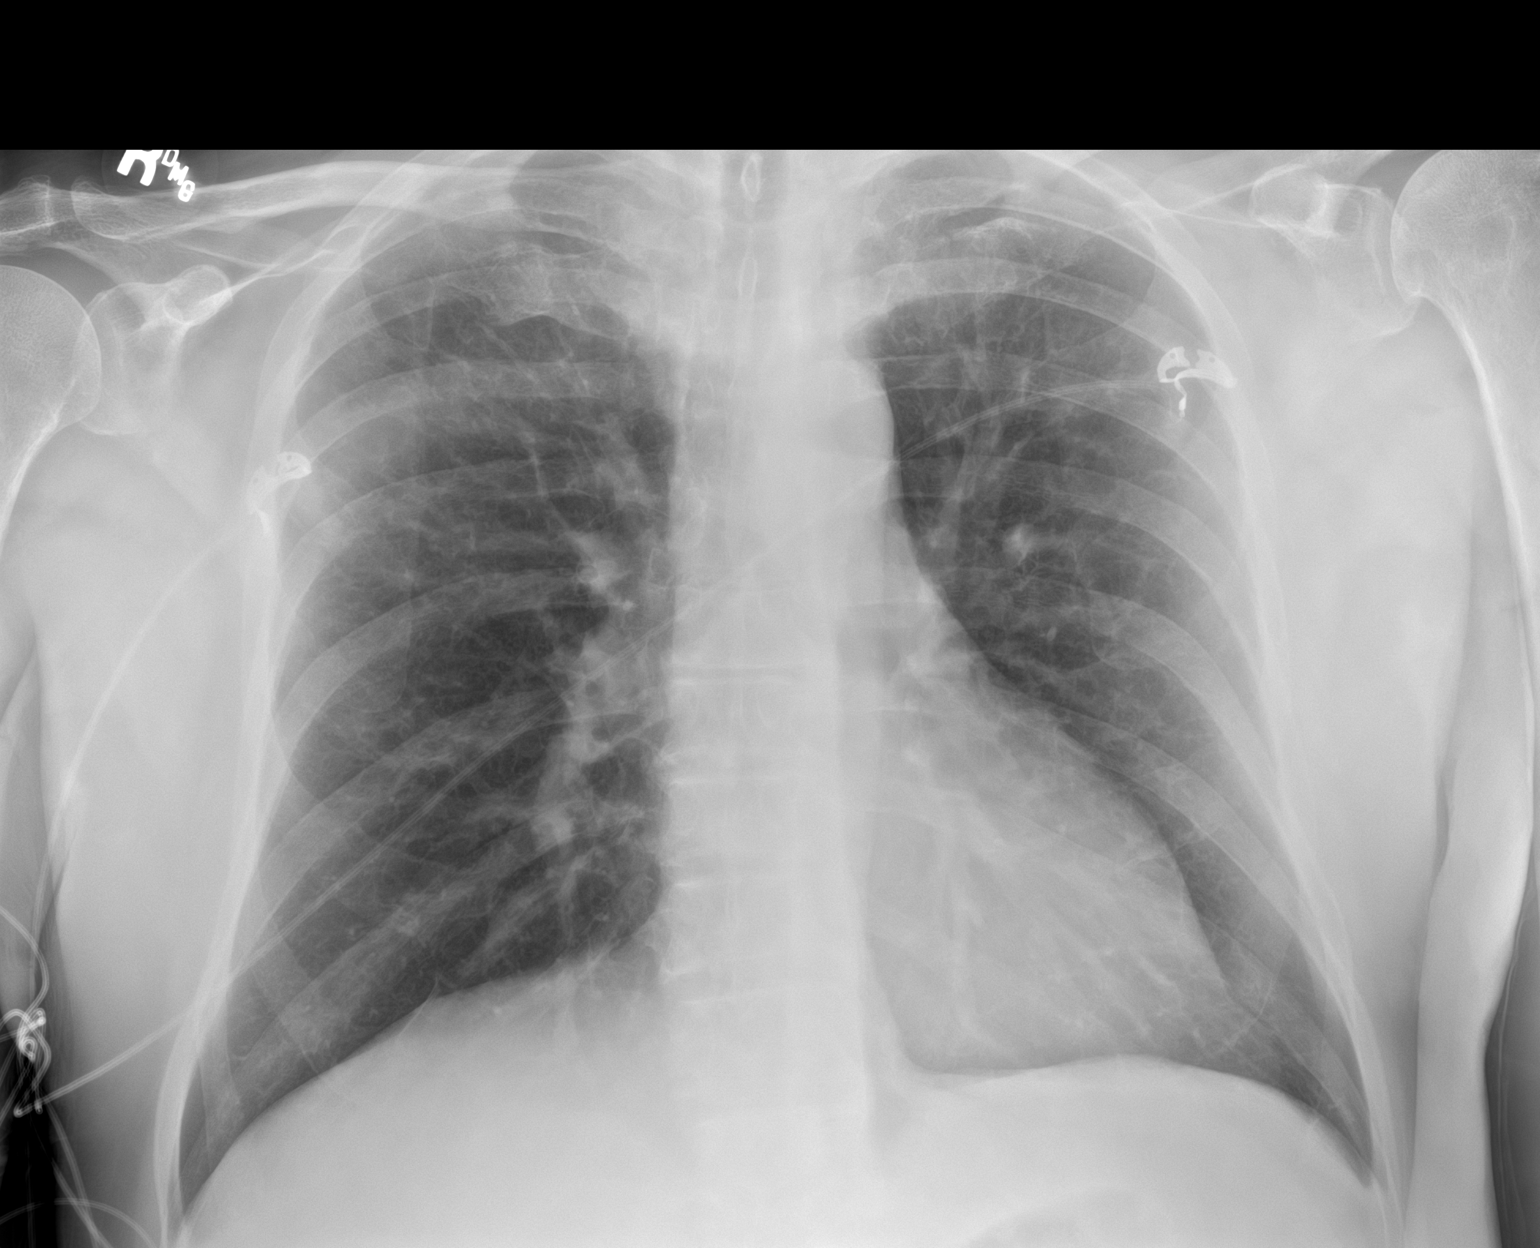

[1 of 1 positions shown; findings below may reference images not displayed]

FINDINGS: The cardiomediastinal silhouette is unremarkable.

There is no evidence of focal airspace disease, pulmonary edema,
suspicious pulmonary nodule/mass, pleural effusion, or pneumothorax.

No acute bony abnormalities are identified.
IMPRESSION: No active disease.

## 2021-02-23 ENCOUNTER — Other Ambulatory Visit: Payer: Self-pay | Admitting: Family Medicine

## 2021-02-24 NOTE — Telephone Encounter (Signed)
Please inquire as to what this is being used for.  Was last prescribed by [facility] in 2020.

## 2021-02-25 ENCOUNTER — Ambulatory Visit (INDEPENDENT_AMBULATORY_CARE_PROVIDER_SITE_OTHER): Payer: PPO

## 2021-02-25 DIAGNOSIS — I340 Nonrheumatic mitral (valve) insufficiency: Secondary | ICD-10-CM

## 2021-02-25 LAB — ECHOCARDIOGRAM COMPLETE
AR max vel: 2.43 cm2
AV Area VTI: 1.9 cm2
AV Area mean vel: 2.08 cm2
AV Mean grad: 4.1 mmHg
AV Peak grad: 7.1 mmHg
AV Vena cont: 0.53 cm
Ao pk vel: 1.33 m/s
Area-P 1/2: 7.16 cm2
Calc EF: 69.3 %
MV M vel: 5.16 m/s
MV Peak grad: 106.3 mmHg
P 1/2 time: 307 msec
Radius: 1.25 cm
S' Lateral: 2.92 cm
Single Plane A2C EF: 69.9 %
Single Plane A4C EF: 66.5 %

## 2021-03-08 DIAGNOSIS — H40033 Anatomical narrow angle, bilateral: Secondary | ICD-10-CM | POA: Diagnosis not present

## 2021-03-08 DIAGNOSIS — H04123 Dry eye syndrome of bilateral lacrimal glands: Secondary | ICD-10-CM | POA: Diagnosis not present

## 2021-03-14 ENCOUNTER — Other Ambulatory Visit: Payer: Self-pay | Admitting: Family Medicine

## 2021-03-18 ENCOUNTER — Telehealth: Payer: Self-pay | Admitting: Cardiology

## 2021-03-18 NOTE — Telephone Encounter (Signed)
New message    Patient returning call for echo results  

## 2021-03-18 NOTE — Telephone Encounter (Signed)
Patient informed. Copy sent to PCP °

## 2021-03-18 NOTE — Telephone Encounter (Signed)
-----   Message from Arnoldo Lenis, MD sent at 03/15/2021 10:14 AM EDT ----- Echo shows heart valve remains just moderatly leaky, we will continue to monitor. Heart pumping funciton remains normal  Zandra Abts MD

## 2021-04-01 ENCOUNTER — Ambulatory Visit (INDEPENDENT_AMBULATORY_CARE_PROVIDER_SITE_OTHER): Payer: PPO

## 2021-04-01 ENCOUNTER — Other Ambulatory Visit: Payer: Self-pay

## 2021-04-01 DIAGNOSIS — Z23 Encounter for immunization: Secondary | ICD-10-CM

## 2021-05-03 ENCOUNTER — Other Ambulatory Visit: Payer: Self-pay | Admitting: Family Medicine

## 2021-05-03 DIAGNOSIS — M159 Polyosteoarthritis, unspecified: Secondary | ICD-10-CM

## 2021-05-18 ENCOUNTER — Other Ambulatory Visit: Payer: Self-pay | Admitting: Family Medicine

## 2021-05-18 ENCOUNTER — Other Ambulatory Visit: Payer: Self-pay | Admitting: Family

## 2021-05-26 ENCOUNTER — Ambulatory Visit: Payer: PPO | Admitting: Family Medicine

## 2021-06-01 ENCOUNTER — Ambulatory Visit (INDEPENDENT_AMBULATORY_CARE_PROVIDER_SITE_OTHER): Payer: PPO | Admitting: Family Medicine

## 2021-06-01 ENCOUNTER — Encounter: Payer: Self-pay | Admitting: Family Medicine

## 2021-06-01 VITALS — BP 116/75 | HR 89 | Temp 97.8°F | Ht 70.0 in | Wt 186.6 lb

## 2021-06-01 DIAGNOSIS — R972 Elevated prostate specific antigen [PSA]: Secondary | ICD-10-CM

## 2021-06-01 DIAGNOSIS — I10 Essential (primary) hypertension: Secondary | ICD-10-CM | POA: Diagnosis not present

## 2021-06-01 DIAGNOSIS — R39198 Other difficulties with micturition: Secondary | ICD-10-CM | POA: Diagnosis not present

## 2021-06-01 MED ORDER — HYDROCHLOROTHIAZIDE 25 MG PO TABS: ORAL_TABLET | ORAL | 3 refills | Status: DC

## 2021-06-01 MED ORDER — LISINOPRIL 40 MG PO TABS
40.0000 mg | ORAL_TABLET | Freq: Every day | ORAL | 3 refills | Status: DC
Start: 2021-06-01 — End: 2022-07-07

## 2021-06-01 NOTE — Progress Notes (Signed)
Subjective: CC:f/u elevated PSA PCP: Janora Norlander, DO [facility] Tyler Diaz is a 75 y.o. male presenting to clinic today for:  1. Elevated PSA He reports incomplete bladder emptying and decreased urinary stream.  No unplanned weight loss. No night sweats.   ROS: Per HPI  Allergies  Allergen Reactions   Shrimp [Shellfish Allergy] Hives, Nausea And Vomiting and Rash    "only shrimp" - crustaceans - blisters, n/v    Past Medical History:  Diagnosis Date   Anemia    hx of   Arthritis    hands/knees   Blood clot in vein    RT ARM WITH PICC LINE    C. difficile colitis 08/19/2013   Colon cancer ([place]) 07/18/12 bx   rectum=Invasive adenocarcinoma w/ extracellular mucin,polyp=benign   GERD (gastroesophageal reflux disease)    OCCASIONAL   Hearing loss    partial greater on left   Heart murmur    Hematochezia    recent   Herpes zoster - R flank - with severe pain 03/11/2013   History of shingles    Hyperlipidemia    Hypertension    Hypothyroidism as child   hx of   Ileostomy - diverting loop - s/p takedown 06/28/2013 01/14/2013   Mitral regurgitation due to partially flail posterior mitral valve leaflet    Nocturia    Numbness    TOES / FINGERS DUE TO CHEMO   Peripheral vascular disease (HCC)    neuropathy due to cancer treatment (fingers and toes)   Radiation    FOR COLON CANCER   Rectal cancer (HCC)    Rheumatic fever as child   hx   Sinusitis    seasonal   Tinnitus    left ear   Umbilical hernia, incarcerated - s/p primary repair July 2014 01/03/2013    Current Outpatient Medications:    acetaminophen (TYLENOL) 500 MG tablet, Take 1,000 mg by mouth every 6 (six) hours as needed for headache. , Disp: , Rfl:    clotrimazole-betamethasone (LOTRISONE) cream, Apply 1 application topically 2 (two) times daily. To neck for rash for up to 2 weeks., Disp: 30 g, Rfl: 0   diclofenac Sodium (VOLTAREN) 1 % GEL, APPLY 4 GRAMS TOPICALLY 4 TIMES A DAY FOR JOINT PAIN, Disp: 400  g, Rfl: 0   hydrochlorothiazide (HYDRODIURIL) 25 MG tablet, TAKE 1 TABLET BY MOUTH ONCE DAILY. STOPPING CHLORTHALIDONE, Disp: 90 tablet, Rfl: 3   ketoconazole (NIZORAL) 2 % cream, Apply 1 application topically 2 (two) times daily., Disp: 60 g, Rfl: 1   lisinopril (ZESTRIL) 40 MG tablet, Take 1 tablet (40 mg total) by mouth daily., Disp: 90 tablet, Rfl: 3   Multiple Vitamin (MULTIVITAMIN WITH MINERALS) TABS, Take 1 tablet by mouth every morning. , Disp: , Rfl:    Olopatadine HCl 0.2 % SOLN, Instill 1 drop in each eye ONCE daily., Disp: 2.5 mL, Rfl: 12   oxymetazoline (AFRIN) 0.05 % nasal spray, Place 1 spray into both nostrils 2 (two) times daily as needed for congestion., Disp: , Rfl:    pravastatin (PRAVACHOL) 20 MG tablet, Take 1 tablet (20 mg total) by mouth daily., Disp: 90 tablet, Rfl: 3   Probiotic Product (DIGESTIVE ADVANTAGE PO), Take 1 tablet by mouth daily., Disp: , Rfl:    PROCTO-MED HC 2.5 % rectal cream, INSERT 1 APPLICATORFUL OF CREAM RECTALLY TWICE DAILY AS NEEDED FOR HEMORRHOIDS OR ANAL ITCHING X 7-10 DAYS, Disp: 28 g, Rfl: 0   psyllium (METAMUCIL) 58.6 % packet, Take 1  packet by mouth daily., Disp: , Rfl:    triamcinolone cream (KENALOG) 0.1 %, Apply topically 3 (three) times daily as needed., Disp: 80 g, Rfl: 0   triamcinolone ointment (KENALOG) 0.1 %, APPLY  OINTMENT TOPICALLY TO AFFECTED AREA THREE TIMES DAILY, Disp: 30 g, Rfl: 0 Social History   Socioeconomic History   Marital status: Widowed    Spouse name: Not on file   Number of children: 2   Years of education: 9   Highest education level: 9th grade  Occupational History   Occupation: Retired    Fish farm manager: Psychologist, occupational [employer]    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  Has two adult children and one grandchild. One son lives with him.    Social Determinants of Health   Financial Resource Strain: Not on file  Food Insecurity: Not on file  Transportation Needs: Not on file  Physical Activity: Insufficiently Active   Days of Exercise per Week: 7 days   Minutes of Exercise per Session: 20 min  Stress: No Stress Concern Present   Feeling of Stress : Only a little  Social Connections: Moderately Isolated   Frequency of Communication with Friends and Family: More than three times a week   Frequency of Social Gatherings with Friends and Family: More than three times a week   Attends Religious Services: 1 to 4 times per year   Active Member of [employer] or Organizations: No   Attends Archivist Meetings: Never   Marital Status: Widowed  Human resources officer Violence: Not At Risk   Fear of Current or Ex-Partner: No   Emotionally Abused: No   Physically Abused: No   Sexually Abused: No   Family History  Problem Relation Age of Onset   Cancer Mother        abdominal ca ?ovarian?   Heart disease Father    Cancer Sister        liver cancer   Heart disease Brother    COPD Brother    Healthy Son    Healthy Son     Objective: Office vital signs reviewed. BP 116/75    Pulse 89    Temp 97.8 F (36.6 C)    Ht 5\' 10"  (1.778 m)    Wt 186 lb 9.6 oz (84.6 kg)    SpO2 97%    BMI 26.77 kg/m   Physical Examination:  General: Awake, alert, well nourished, No acute distress HEENT: Normal; sclera white Cardio: regular rate and rhythm, S1S2 heard,  2/6 SEM Pulm: clear to auscultation bilaterally, no wheezes, rhonchi or rales; normal work of breathing on room air   Assessment/ Plan: 75 y.o. male   Elevated PSA - Plan: PSA  Decreased urine stream - Plan:  PSA  Essential hypertension - Plan: lisinopril (ZESTRIL) 40 MG tablet  Patient remains symptomatic with incomplete evacuation of bladder/decreased urinary stream.  Given his a rise of PSA from 3 years  ago from 0.7-2.6 I am electing to repeat this PSA.  Would say that if he still has relative elevation, we should strongly consider having him see the urologist.  If he has shown a downtrend in PSA, could consider treatment for BPH with something like Flomax.  Though I would strongly consider discontinuing his HCTZ so as to avoid orthostasis.  Blood pressure otherwise controlled and he can continue lisinopril.  May follow-up in 6 months, sooner if concerns arise  No orders of the defined types were placed in this encounter.  No orders of the defined types were placed in this encounter.    Janora Norlander, DO [place] [phone]

## 2021-06-01 NOTE — Patient Instructions (Addendum)
Will check your prostate level again.  Should consider seeing a urologist since you have issues with prostate symptoms.  Might be worth them doing a prostate exam on you.  We'll see what your level shows and decide then.  Benign Prostatic Hyperplasia Benign prostatic hyperplasia (BPH) is an enlarged prostate gland that is caused by the normal aging process. The prostate may get bigger as a man gets older. The condition is not caused by cancer. The prostate is a walnut-sized gland that is involved in the production of semen. It is located in front of the rectum and below the bladder. The bladder stores urine. The urethra carries stored urine out of the body. An enlarged prostate can press on the urethra. This can make it harder to pass urine. The buildup of urine in the bladder can cause infection. Back pressure and infection may progress to bladder damage and kidney (renal) failure. What are the causes? This condition is part of the normal aging process. However, not all men develop problems from this condition. If the prostate enlarges away from the urethra, urine flow will not be blocked. If it enlarges toward the urethra and compresses it, there will be problems passing urine. What increases the risk? This condition is more likely to develop in men older than 50 years. What are the signs or symptoms? Symptoms of this condition include: Getting up often during the night to urinate. Needing to urinate frequently during the day. Difficulty starting urine flow. Decrease in size and strength of your urine stream. Leaking (dribbling) after urinating. Inability to pass urine. This needs immediate treatment. Inability to completely empty your bladder. Pain when you pass urine. This is more common if there is also an infection. Urinary tract infection (UTI). How is this diagnosed? This condition is diagnosed based on your medical history, a physical exam, and your symptoms. Tests will also be done,  such as: A post-void bladder scan. This measures any amount of urine that may remain in your bladder after you finish urinating. A digital rectal exam. In a rectal exam, your health care provider checks your prostate by putting a lubricated, gloved finger into your rectum to feel the back of your prostate gland. This exam detects the size of your gland and any abnormal lumps or growths. An exam of your urine (urinalysis). A prostate specific antigen (PSA) screening. This is a blood test used to screen for prostate cancer. An ultrasound. This test uses sound waves to electronically produce a picture of your prostate gland. Your health care provider may refer you to a specialist in kidney and prostate diseases (urologist). How is this treated? Once symptoms begin, your health care provider will monitor your condition (active surveillance or watchful waiting). Treatment for this condition will depend on the severity of your condition. Treatment may include: Observation and yearly exams. This may be the only treatment needed if your condition and symptoms are mild. Medicines to relieve your symptoms, including: Medicines to shrink the prostate. Medicines to relax the muscle of the prostate. Surgery in severe cases. Surgery may include: Prostatectomy. In this procedure, the prostate tissue is removed completely through an open incision or with a laparoscope or robotics. Transurethral resection of the prostate (TURP). In this procedure, a tool is inserted through the opening at the tip of the penis (urethra). It is used to cut away tissue of the inner core of the prostate. The pieces are removed through the same opening of the penis. This removes the blockage. Transurethral  incision (TUIP). In this procedure, small cuts are made in the prostate. This lessens the prostate's pressure on the urethra. Transurethral microwave thermotherapy (TUMT). This procedure uses microwaves to create heat. The heat  destroys and removes a small amount of prostate tissue. Transurethral needle ablation (TUNA). This procedure uses radio frequencies to destroy and remove a small amount of prostate tissue. Interstitial laser coagulation ([place]). This procedure uses a laser to destroy and remove a small amount of prostate tissue. Transurethral electrovaporization (TUVP). This procedure uses electrodes to destroy and remove a small amount of prostate tissue. Prostatic urethral lift. This procedure inserts an implant to push the lobes of the prostate away from the urethra. Follow these instructions at home: Take over-the-counter and prescription medicines only as told by your health care provider. Monitor your symptoms for any changes. Contact your health care provider with any changes. Avoid drinking large amounts of liquid before going to bed or out in public. Avoid or reduce how much caffeine or alcohol you drink. Give yourself time when you urinate. Keep all follow-up visits. This is important. Contact a health care provider if: You have unexplained back pain. Your symptoms do not get better with treatment. You develop side effects from the medicine you are taking. Your urine becomes very dark or has a bad smell. Your lower abdomen becomes distended and you have trouble passing urine. Get help right away if: You have a fever or chills. You suddenly cannot urinate. You feel light-headed or very dizzy, or you faint. There are large amounts of blood or clots in your urine. Your urinary problems become hard to manage. You develop moderate to severe low back or flank pain. The flank is the side of your body between the ribs and the hip. These symptoms may be an emergency. Get help right away. Call 911. Do not wait to see if the symptoms will go away. Do not drive yourself to the hospital. Summary Benign prostatic hyperplasia (BPH) is an enlarged prostate that is caused by the normal aging process. It is not  caused by cancer. An enlarged prostate can press on the urethra. This can make it hard to pass urine. This condition is more likely to develop in men older than 50 years. Get help right away if you suddenly cannot urinate. This information is not intended to replace advice given to you by your health care provider. Make sure you discuss any questions you have with your health care provider. Document Revised: 12/09/2020 Document Reviewed: 12/09/2020 Elsevier Patient Education  [place].

## 2021-06-02 ENCOUNTER — Other Ambulatory Visit: Payer: Self-pay | Admitting: Family Medicine

## 2021-06-02 DIAGNOSIS — R39198 Other difficulties with micturition: Secondary | ICD-10-CM

## 2021-06-02 LAB — PSA: Prostate Specific Ag, Serum: 2.8 ng/mL (ref 0.0–4.0)

## 2021-06-02 NOTE — Progress Notes (Signed)
PT R/C 

## 2021-06-29 ENCOUNTER — Ambulatory Visit: Payer: PPO | Admitting: Urology

## 2021-06-30 ENCOUNTER — Ambulatory Visit: Payer: PPO | Admitting: Cardiology

## 2021-06-30 ENCOUNTER — Encounter: Payer: Self-pay | Admitting: Cardiology

## 2021-06-30 VITALS — BP 138/70 | HR 70 | Ht 70.0 in | Wt 187.4 lb

## 2021-06-30 DIAGNOSIS — I10 Essential (primary) hypertension: Secondary | ICD-10-CM | POA: Diagnosis not present

## 2021-06-30 DIAGNOSIS — E782 Mixed hyperlipidemia: Secondary | ICD-10-CM

## 2021-06-30 DIAGNOSIS — I340 Nonrheumatic mitral (valve) insufficiency: Secondary | ICD-10-CM | POA: Diagnosis not present

## 2021-06-30 NOTE — Progress Notes (Signed)
Clinical Summary Tyler Diaz is a 76 y.o.male seen today for follow up of the following medical problems.      1. Mitral regurgitation - 11/2017 echo LVEF 60%, moderate to severe MR. Partial flail cusp of posterior leaflet. LVIDs 3.6 - 08/2013 TEE flail segment of P2 scallop posterior leaflet due to ruptured chordae, moderate to severe MR   03/2019 TTE There is posterior leaflet prolapse and  a calcified density on the atrial side of the posterior leaflet that could  represent a partial flail subsegment of the posterior leaflet or perhaps  an old vegetation. There is  eccentric, anteriorly directed, moderate to severe mitral valve  Regurgitation     07/2019 TEE: Ruptured chordae attached to the posterior leaflet with  partial prolapse of P2. Chronic finding when compared to 07/2013 TEE. There  is eccentric anterior directed MR thats difficult to quantify due to  eccentricity, appears moderate to  severe.     02/2021 echo: LVEF 60-65%, no WMAs, indet diastolic, normal RV, moderate MR - no recent SOB/DOE, no LE edema   2. HTN  - his home bp's are usually 110s-120s/60s-70s -reports some white coat HTN.  - compliant with meds   3. Hyperlipidemia   11/2020 TC 140 TG 137 HDL 40 LDL 76 - compliant with pravastatin   4. Colorectal cancer - s/p surgery, currently cancer free.      5. Recent Sepsis/Bacteremia - recent admission 07/2019 with sepsis and staph aureus bacteremia - TEE without signs of endocarditis   Past Medical History:  Diagnosis Date   Anemia    hx of   Arthritis    hands/knees   Blood clot in vein    RT ARM WITH PICC LINE    C. difficile colitis 08/19/2013   Colon cancer ([place]) 07/18/12 bx   rectum=Invasive adenocarcinoma w/ extracellular mucin,polyp=benign   GERD (gastroesophageal reflux disease)    OCCASIONAL   Hearing loss    partial greater on left   Heart murmur    Hematochezia    recent   Herpes zoster - R flank - with severe pain 03/11/2013    History of shingles    Hyperlipidemia    Hypertension    Hypothyroidism as child   hx of   Ileostomy - diverting loop - s/p takedown 06/28/2013 01/14/2013   Mitral regurgitation due to partially flail posterior mitral valve leaflet    Nocturia    Numbness    TOES / FINGERS DUE TO CHEMO   Peripheral vascular disease (HCC)    neuropathy due to cancer treatment (fingers and toes)   Radiation    FOR COLON CANCER   Rectal cancer (HCC)    Rheumatic fever as child   hx   Sinusitis    seasonal   Tinnitus    left ear   Umbilical hernia, incarcerated - s/p primary repair July 2014 01/03/2013     Allergies  Allergen Reactions   Shrimp [Shellfish Allergy] Hives, Nausea And Vomiting and Rash    "only shrimp" - crustaceans - blisters, n/v      Current Outpatient Medications  Medication Sig Dispense Refill   acetaminophen (TYLENOL) 500 MG tablet Take 1,000 mg by mouth every 6 (six) hours as needed for headache.      clotrimazole-betamethasone (LOTRISONE) cream Apply 1 application topically 2 (two) times daily. To neck for rash for up to 2 weeks. 30 g 0   diclofenac Sodium (VOLTAREN) 1 % GEL APPLY 4 GRAMS TOPICALLY 4  TIMES A DAY FOR JOINT PAIN 400 g 0   hydrochlorothiazide (HYDRODIURIL) 25 MG tablet TAKE 1 TABLET BY MOUTH ONCE DAILY. STOPPING CHLORTHALIDONE 90 tablet 3   ketoconazole (NIZORAL) 2 % cream Apply 1 application topically 2 (two) times daily. 60 g 1   lisinopril (ZESTRIL) 40 MG tablet Take 1 tablet (40 mg total) by mouth daily. 90 tablet 3   Multiple Vitamin (MULTIVITAMIN WITH MINERALS) TABS Take 1 tablet by mouth every morning.      Olopatadine HCl 0.2 % SOLN Instill 1 drop in each eye ONCE daily. 2.5 mL 12   oxymetazoline (AFRIN) 0.05 % nasal spray Place 1 spray into both nostrils 2 (two) times daily as needed for congestion.     pravastatin (PRAVACHOL) 20 MG tablet Take 1 tablet (20 mg total) by mouth daily. 90 tablet 3   Probiotic Product (DIGESTIVE ADVANTAGE PO) Take 1 tablet  by mouth daily.     PROCTO-MED HC 2.5 % rectal cream INSERT 1 APPLICATORFUL OF CREAM RECTALLY TWICE DAILY AS NEEDED FOR HEMORRHOIDS OR ANAL ITCHING X 7-10 DAYS 28 g 0   psyllium (METAMUCIL) 58.6 % packet Take 1 packet by mouth daily.     triamcinolone cream (KENALOG) 0.1 % Apply topically 3 (three) times daily as needed. 80 g 0   No current facility-administered medications for this visit.     Past Surgical History:  Procedure Laterality Date   CARPAL TUNNEL RELEASE Right 2002   ILEOSTOMY N/A 12/27/2012   Procedure: DIVERTING LOOP ILEOSTOMY;  Surgeon: Adin Hector, MD;  Location: [facility];  Service: General;  Laterality: N/A;   ILEOSTOMY CLOSURE N/A 06/28/2013   Procedure: loop ILEOSTOMY TAKEDOWN examination of anesthesia ;  Surgeon: Adin Hector, MD;  Location: [facility];  Service: General;  Laterality: N/A;   INGUINAL HERNIA REPAIR Left    INGUINAL HERNIA REPAIR Bilateral 08/13/2015   Procedure: LAPAROSCOPIC BILATERAL INGUINAL HERNIA REPAIR WITH MESH;  Surgeon: Michael Boston, MD;  Location: [facility];  Service: General;  Laterality: Bilateral;   INSERTION OF MESH Bilateral 08/13/2015   Procedure: INSERTION OF MESH;  Surgeon: Michael Boston, MD;  Location: [facility];  Service: General;  Laterality: Bilateral;   LAPAROSCOPIC LOW ANTERIOR RESCECTION WITH COLOANAL ANASTOMOSIS N/A 12/27/2012   Procedure: LAPAROSCOPIC LOW ANTERIOR RESCECTION WITH COLOANAL ANASTOMOSIS;  Surgeon: Adin Hector, MD;  Location: [facility];  Service: General;  Laterality: N/A;   LAPAROSCOPIC LOW ANTERIOR RESECTION N/A 12/27/2012   Procedure: LAPAROSCOPIC LOW ANTERIOR RESECTION, COLO-ANAL ANASTOMOSIS, DIVERTING LOOP ILEOSTOMY,SPLENIC FLEXURE MOBILIZATION, PRIMARY INCARCERATED UMBILICAL HERNIA REPAIR;  Surgeon: Adin Hector, MD;  Location: [facility];  Service: General;  Laterality: N/A;   TEE WITHOUT CARDIOVERSION N/A 07/10/2013   Procedure: TRANSESOPHAGEAL ECHOCARDIOGRAM (TEE);  Surgeon: Candee Furbish, MD;  Location: [facility] ENDOSCOPY;   Service: Cardiovascular;  Laterality: N/A;   TEE WITHOUT CARDIOVERSION N/A 08/21/2013   Procedure: TRANSESOPHAGEAL ECHOCARDIOGRAM (TEE);  Surgeon: Sanda Klein, MD;  Location: [facility];  Service: Cardiovascular;  Laterality: N/A;   TEE WITHOUT CARDIOVERSION N/A 07/16/2019   Procedure: TRANSESOPHAGEAL ECHOCARDIOGRAM (TEE) WITH PROPOFOL;  Surgeon: Arnoldo Lenis, MD;  Location: [facility];  Service: Endoscopy;  Laterality: N/A;   TONSILLECTOMY     as child   UMBILICAL HERNIA REPAIR  12/27/2012   Procedure: PRIMARY REPAIR INCARCERATED UMBILICAL HERNIA;  Surgeon: Adin Hector, MD;  Location: [facility];  Service: General;;     Allergies  Allergen Reactions   Shrimp [Shellfish Allergy] Hives, Nausea And Vomiting and Rash    "  only shrimp" - crustaceans - blisters, n/v       Family History  Problem Relation Age of Onset   Cancer Mother        abdominal ca ?ovarian?   Heart disease Father    Cancer Sister        liver cancer   Heart disease Brother    COPD Brother    Healthy Son    Healthy Son      Social History Mr. Philson reports that he quit smoking about 48 years ago. His smoking use included cigarettes. He has a 30.00 pack-year smoking history. He has never used smokeless tobacco. Mr. Purtee reports no history of alcohol use.   Review of Systems CONSTITUTIONAL: No weight loss, fever, chills, weakness or fatigue.  HEENT: Eyes: No visual loss, blurred vision, double vision or yellow sclerae.No hearing loss, sneezing, congestion, runny nose or sore throat.  SKIN: No rash or itching.  CARDIOVASCULAR: per hpi RESPIRATORY: No shortness of breath, cough or sputum.  GASTROINTESTINAL: No anorexia, nausea, vomiting or diarrhea. No abdominal pain or blood.  GENITOURINARY: No burning on urination, no polyuria NEUROLOGICAL: No headache, dizziness, syncope, paralysis, ataxia, numbness or tingling in the extremities. No change in bowel or bladder control.  MUSCULOSKELETAL: No muscle, back  pain, joint pain or stiffness.  LYMPHATICS: No enlarged nodes. No history of splenectomy.  PSYCHIATRIC: No history of depression or anxiety.  ENDOCRINOLOGIC: No reports of sweating, cold or heat intolerance. No polyuria or polydipsia.  Marland Kitchen   Physical Examination Today's Vitals   06/30/21 0841  BP: 138/70  Pulse: 70  SpO2: 97%  Weight: 187 lb 6.4 oz (85 kg)  Height: 5\' 10"  (1.778 m)   Body mass index is 26.89 kg/m.  Gen: resting comfortably, no acute distress HEENT: no scleral icterus, pupils equal round and reactive, no palptable cervical adenopathy,  CV: RRR, no m/r/g no jvd Resp: Clear to auscultation bilaterally GI: abdomen is soft, non-tender, non-distended, normal bowel sounds, no hepatosplenomegaly MSK: extremities are warm, no edema.  Skin: warm, no rash Neuro:  no focal deficits Psych: appropriate affect   Diagnostic Studies 03/2019 echo IMPRESSIONS     1. Left ventricular ejection fraction, by visual estimation, is 55 to  60%. The left ventricle has normal function. Normal left ventricular size.  There is mildly increased left ventricular hypertrophy.   2. Global right ventricle has normal systolic function.The right  ventricular size is normal. No increase in right ventricular wall  thickness.   3. Left atrial size was mildly dilated.   4. Right atrial size was normal.   5. Mild aortic valve annular calcification.   6. The mitral valve is abnormal. There is posterior leaflet prolapse and  a calcified density on the atrial side of the posterior leaflet that could  represent a partial flail subsegment of the posterior leaflet or perhaps  an old vegetation. There is  eccentric, anteriorly directed, moderate to severe mitral valve  regurgitation. PISA calculations are challenging with very eccentric jet,  but one image suggests MR radius is at least 1 cm which is consistent with  severe regurgitation. Suggest TEE for  further evaluation if clinically indicated.    7. The tricuspid valve is grossly normal. Tricuspid valve regurgitation  is mild.   8. The aortic valve is tricuspid Aortic valve regurgitation is trivial by  color flow Doppler.   9. The pulmonic valve was grossly normal. Pulmonic valve regurgitation is  mild by color flow Doppler.  10. Normal  pulmonary artery systolic pressure.  11. The tricuspid regurgitant velocity is 2.36 m/s, and with an assumed  right atrial pressure of 3 mmHg, the estimated right ventricular systolic  pressure is normal at 25.3 mmHg.  12. The inferior vena cava is normal in size with greater than 50%  respiratory variability, suggesting right atrial pressure of 3 mmHg.   In comparison to the previous echocardiogram(s): Previous Echo at [facility]  showed LV EF 60%, mild LAE, moderate to severe MR with a wall-impinging MR  jet and a flail cusp of the posterior leaflet. Mild MR, PI and dilated  IVC. These images were not able  to be viewed directly.  FINDINGS   Left Ventricle: Left ventricular ejection fraction, by visual estimation,  is 55 to 60%. The left ventricle has normal function. There is mildly  increased left ventricular hypertrophy. Normal left ventricular size.  Spectral Doppler shows Left ventricular   diastolic parameters were normal pattern of LV diastolic filling.      08/7626 TEE IMPRESSIONS     1. Left ventricular ejection fraction, by estimation, is 60 to 65%. The  left ventricle has normal function. The left ventrical has no regional  wall motion abnormalities. Left ventricular diastolic function could not  be evaluated.   2. Right ventricular systolic function is normal. The right ventricular  size is normal.   3. Left atrial size was moderately dilated. No left atrial/left atrial  appendage thrombus was detected.   4. Right atrial size was moderately dilated.   5. Ruptured chordae attached to the posterior leaflet with partial  prolapse of P2. Chronic finding when compared to 07/2013  TEE. There is  eccentric anterior directed MR thats difficult to quantify due to  eccentricity, appears moderate to severe. . The  mitral valve is abnormal. Moderate to severe MR mitral valve  regurgitation. No evidence of mitral stenosis.   6. Tricuspid valve regurgitation is mild to moderate.   7. The aortic valve is tricuspid. Aortic valve regurgitation is trivial .  No aortic stenosis is present.     02/2021 echo IMPRESSIONS     1. Left ventricular ejection fraction, by estimation, is 60 to 65%. The  left ventricle has normal function. The left ventricle has no regional  wall motion abnormalities. Left ventricular diastolic parameters are  indeterminate.   2. Right ventricular systolic function is normal. The right ventricular  size is normal. There is mildly elevated pulmonary artery systolic  pressure.   3. Left atrial size was mildly dilated.   4. There is prolapse of a portion of the posterior MV leaflet with  resultant eccentric anterior MR. The MR VC is 0.6 cm. The MV/AV VTI ratio  is 1.3 supporting moderate MR. . The mitral valve is abnormal. Moderate  mitral valve regurgitation. No evidence  of mitral stenosis.   5. The aortic valve is tricuspid. Aortic valve regurgitation is mild. No  aortic stenosis is present.   6. The inferior vena cava is normal in size with greater than 50%  respiratory variability, suggesting right atrial pressure of 3 mmHg.    Assessment and Plan  1. Mitral regurgitation - moderate to severe by echos over time - no significant symptoms, continue to monitor - repeat echo after next visit.      2. HTN - he prefers HCTZ instead of chlorthaldone due to cost - has white coat HTN often elevated in clinic. Home numbers remain at goal - continue current meds   3. Hyperlipidemia -cholesterol  at goal, continue pravastatin.    F/u 6 months        Arnoldo Lenis, M.D.

## 2021-06-30 NOTE — Patient Instructions (Signed)

## 2021-07-01 ENCOUNTER — Ambulatory Visit (INDEPENDENT_AMBULATORY_CARE_PROVIDER_SITE_OTHER): Payer: PPO | Admitting: Family Medicine

## 2021-07-01 ENCOUNTER — Encounter: Payer: Self-pay | Admitting: Family Medicine

## 2021-07-01 DIAGNOSIS — U071 COVID-19: Secondary | ICD-10-CM

## 2021-07-01 MED ORDER — MOLNUPIRAVIR EUA 200MG CAPSULE
4.0000 | ORAL_CAPSULE | Freq: Two times a day (BID) | ORAL | 0 refills | Status: AC
Start: 2021-07-01 — End: 2021-07-06

## 2021-07-01 NOTE — Progress Notes (Signed)
Virtual Visit via telephone Note  I connected with Tyler Diaz on 07/01/21 at 1750 by telephone and verified that I am speaking with the correct person using two identifiers. Tyler Diaz is currently located at home and patient are currently with her during visit. The provider, Fransisca Kaufmann Wauneta Silveria, MD is located in their office at time of visit.  Call ended at 1755  I discussed the limitations, risks, security and privacy concerns of performing an evaluation and management service by telephone and the availability of in person appointments. I also discussed with the patient that there may be a patient responsible charge related to this service. The patient expressed understanding and agreed to proceed.   History and Present Illness: Patient is calling in for congestion yesterday and nasal pressure.  He is getting congestion in his chest. His son did a covid test and it was positive. He had 3 covid vaccines.  He denies fevers or chills.  He is used Pharmacist, hospital cold and flu and lozenges and they helped some.  He does take tylenol normally.   1. COVID-19 virus infection     Outpatient Encounter Medications as of 07/01/2021  Medication Sig   molnupiravir EUA (LAGEVRIO) 200 mg CAPS capsule Take 4 capsules (800 mg total) by mouth 2 (two) times daily for 5 days.   acetaminophen (TYLENOL) 500 MG tablet Take 1,000 mg by mouth every 6 (six) hours as needed for headache.    clotrimazole-betamethasone (LOTRISONE) cream Apply 1 application topically 2 (two) times daily. To neck for rash for up to 2 weeks.   diclofenac Sodium (VOLTAREN) 1 % GEL APPLY 4 GRAMS TOPICALLY 4 TIMES A DAY FOR JOINT PAIN   hydrochlorothiazide (HYDRODIURIL) 25 MG tablet TAKE 1 TABLET BY MOUTH ONCE DAILY. STOPPING CHLORTHALIDONE   ketoconazole (NIZORAL) 2 % cream Apply 1 application topically 2 (two) times daily.   lisinopril (ZESTRIL) 40 MG tablet Take 1 tablet (40 mg total) by mouth daily.   Multiple Vitamin (MULTIVITAMIN  WITH MINERALS) TABS Take 1 tablet by mouth every morning.    Olopatadine HCl 0.2 % SOLN Instill 1 drop in each eye ONCE daily.   oxymetazoline (AFRIN) 0.05 % nasal spray Place 1 spray into both nostrils 2 (two) times daily as needed for congestion.   pravastatin (PRAVACHOL) 20 MG tablet Take 1 tablet (20 mg total) by mouth daily.   Probiotic Product (DIGESTIVE ADVANTAGE PO) Take 1 tablet by mouth daily.   PROCTO-MED HC 2.5 % rectal cream INSERT 1 APPLICATORFUL OF CREAM RECTALLY TWICE DAILY AS NEEDED FOR HEMORRHOIDS OR ANAL ITCHING X 7-10 DAYS   psyllium (METAMUCIL) 58.6 % packet Take 1 packet by mouth daily.   triamcinolone cream (KENALOG) 0.1 % Apply topically 3 (three) times daily as needed.   No facility-administered encounter medications on file as of 07/01/2021.    Review of Systems  Constitutional:  Negative for chills and fever.  HENT:  Positive for congestion and sore throat. Negative for ear discharge, ear pain, postnasal drip, rhinorrhea, sinus pressure, sneezing and voice change.   Eyes:  Negative for visual disturbance.  Respiratory:  Positive for cough. Negative for shortness of breath and wheezing.   Cardiovascular:  Negative for chest pain and leg swelling.  Musculoskeletal:  Negative for gait problem.  Skin:  Negative for rash.  All other systems reviewed and are negative.  Observations/Objective: Patient sounds comfortable and in no acute distress.  Assessment and Plan: Problem List Items Addressed This Visit   None  Visit Diagnoses     COVID-19 virus infection    -  Primary   Relevant Medications   molnupiravir EUA (LAGEVRIO) 200 mg CAPS capsule     Will have patient take antiviral, if any symptoms worsen then call us back or going to the emergency department but he sounds very stable right now and is already been vaccinated so I am imagining that he is not going to do too poorly with this.  He is high risk low so I did send in the antiviral.  Follow up  plan: Return if symptoms worsen or fail to improve.     I discussed the assessment and treatment plan with the patient. The patient was provided an opportunity to ask questions and all were answered. The patient agreed with the plan and demonstrated an understanding of the instructions.   The patient was advised to call back or seek an in-person evaluation if the symptoms worsen or if the condition fails to improve as anticipated.  The above assessment and management plan was discussed with the patient. The patient verbalized understanding of and has agreed to the management plan. Patient is aware to call the clinic if symptoms persist or worsen. Patient is aware when to return to the clinic for a follow-up visit. Patient educated on when it is appropriate to go to the emergency department.    I provided 5 minutes of non-face-to-face time during this encounter.    Worthy Rancher, MD

## 2021-09-17 ENCOUNTER — Other Ambulatory Visit: Payer: Self-pay | Admitting: Family Medicine

## 2021-11-02 ENCOUNTER — Other Ambulatory Visit: Payer: Self-pay | Admitting: Family Medicine

## 2021-11-09 ENCOUNTER — Other Ambulatory Visit: Payer: Self-pay | Admitting: Family Medicine

## 2021-11-29 ENCOUNTER — Ambulatory Visit (INDEPENDENT_AMBULATORY_CARE_PROVIDER_SITE_OTHER): Payer: PPO

## 2021-11-29 VITALS — Wt 187.0 lb

## 2021-11-29 DIAGNOSIS — Z0000 Encounter for general adult medical examination without abnormal findings: Secondary | ICD-10-CM

## 2021-12-01 ENCOUNTER — Ambulatory Visit (INDEPENDENT_AMBULATORY_CARE_PROVIDER_SITE_OTHER): Payer: PPO | Admitting: Family Medicine

## 2021-12-01 ENCOUNTER — Encounter: Payer: Self-pay | Admitting: Family Medicine

## 2021-12-01 VITALS — BP 140/82 | HR 75 | Temp 97.6°F | Resp 20 | Ht 70.0 in | Wt 184.0 lb

## 2021-12-01 DIAGNOSIS — D692 Other nonthrombocytopenic purpura: Secondary | ICD-10-CM | POA: Diagnosis not present

## 2021-12-01 DIAGNOSIS — K529 Noninfective gastroenteritis and colitis, unspecified: Secondary | ICD-10-CM | POA: Diagnosis not present

## 2021-12-01 DIAGNOSIS — R972 Elevated prostate specific antigen [PSA]: Secondary | ICD-10-CM | POA: Diagnosis not present

## 2021-12-01 DIAGNOSIS — M792 Neuralgia and neuritis, unspecified: Secondary | ICD-10-CM

## 2021-12-01 DIAGNOSIS — Z0001 Encounter for general adult medical examination with abnormal findings: Secondary | ICD-10-CM | POA: Diagnosis not present

## 2021-12-01 DIAGNOSIS — E781 Pure hyperglyceridemia: Secondary | ICD-10-CM | POA: Diagnosis not present

## 2021-12-01 DIAGNOSIS — Z0000 Encounter for general adult medical examination without abnormal findings: Secondary | ICD-10-CM

## 2021-12-01 DIAGNOSIS — I10 Essential (primary) hypertension: Secondary | ICD-10-CM | POA: Diagnosis not present

## 2021-12-01 DIAGNOSIS — D539 Nutritional anemia, unspecified: Secondary | ICD-10-CM

## 2021-12-01 DIAGNOSIS — Z85048 Personal history of other malignant neoplasm of rectum, rectosigmoid junction, and anus: Secondary | ICD-10-CM

## 2021-12-01 MED ORDER — DIPHENOXYLATE-ATROPINE 2.5-0.025 MG PO TABS: 1.0000 | ORAL_TABLET | Freq: Every day | ORAL | 1 refills | Status: DC | PRN

## 2021-12-01 MED ORDER — GABAPENTIN 100 MG PO CAPS: 100.0000 mg | ORAL_CAPSULE | Freq: Two times a day (BID) | ORAL | 12 refills | Status: DC | PRN

## 2021-12-01 MED ORDER — PRAVASTATIN SODIUM 20 MG PO TABS: 20.0000 mg | ORAL_TABLET | Freq: Every day | ORAL | 3 refills | Status: DC

## 2021-12-01 NOTE — Patient Instructions (Signed)
Use the new diarrhea medication SPARINGLY.  It is considered a HIGH risk medication in persons over age 78.  Gabapentin sent for the nerve pain in your legs.  Take at night to start then can increase to twice daily if not too sleepy of medication.

## 2021-12-01 NOTE — Progress Notes (Signed)
Tyler Diaz is a 76 y.o. male presents to office today for annual physical exam examination.    Concerns today include: 1.  Arthritis, nerve pain Patient reports he has polyarthritis primarily in the left wrist and bilateral thumb joints.  He suffers from bilateral knee pain this has been a chronic issue for him but sometimes he feels some neuropathic type pains down the legs.  He uses 1000 mg of Tylenol roughly 3-4 times daily as well as topical Voltaren gel.  He does avoid the Voltaren gel on the hands though because he worries about excellently rubbing it in his eyes.  2.  Diarrhea Patient reports nonbloody diarrhea that is been a chronic issue for him since he had colonic resection, radiation and chemotherapy for colon cancer several years ago.  He uses over-the-counter diarrheal medications which do help some but he sometimes finds this hard to find.  Wondering if there is anything prescription we might be able to provide him.  He only uses these types of medications when he is leaving the home so as not to have a stool accident.  This is roughly 1 or maybe twice weekly.  Occupation: Retired, Substance use: None Diet: Balanced, Exercise: No structured Last colonoscopy: UTD Refills needed today: None Immunizations needed: Immunization History  Administered Date(s) Administered   Fluad Quad(high Dose 65+) 03/21/2019, 03/25/2020, 04/01/2021   Influenza Inj Mdck Quad Pf 03/21/2019   Influenza, High Dose Seasonal PF 03/16/2018   Influenza,inj,Quad PF,6+ Mos 05/21/2013, 03/13/2014, 03/10/2015, 03/14/2016, 03/08/2017   Moderna Sars-Covid-2 Vaccination 03/25/2020, 04/29/2020, 11/24/2020   Pneumococcal Conjugate-13 06/06/2014   Pneumococcal Polysaccharide-23 05/21/2013   Tdap 11/24/2020     Past Medical History:  Diagnosis Date   Anemia    hx of   Arthritis    hands/knees   Blood clot in vein    RT ARM WITH PICC LINE    C. difficile colitis 08/19/2013   Colon cancer ([facility]) 07/18/12  bx   rectum=Invasive adenocarcinoma w/ extracellular mucin,polyp=benign   GERD (gastroesophageal reflux disease)    OCCASIONAL   Hearing loss    partial greater on left   Heart murmur    Hematochezia    recent   Herpes zoster - R flank - with severe pain 03/11/2013   History of shingles    Hyperlipidemia    Hypertension    Hypothyroidism as child   hx of   Ileostomy - diverting loop - s/p takedown 06/28/2013 01/14/2013   Mitral regurgitation due to partially flail posterior mitral valve leaflet    Nocturia    Numbness    TOES / FINGERS DUE TO CHEMO   Peripheral vascular disease (HCC)    neuropathy due to cancer treatment (fingers and toes)   Radiation    FOR COLON CANCER   Rectal cancer ([facility])    Rheumatic fever as child   hx   Sinusitis    seasonal   Staphylococcus aureus bacteremia 07/12/2019   Staphylococcus aureus sepsis ([place]) 07/17/2019   Tinnitus    left ear   Umbilical hernia, incarcerated - s/p primary repair July 2014 01/03/2013   Social History   Socioeconomic History   Marital status: Widowed    Spouse name: Not on file   Number of children: 2   Years of education: 9   Highest education level: 9th grade  Occupational History   Occupation: Retired    Fish farm manager: Marine scientist    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  Has two adult children and one grandchild. One son lives with him.    Social Determinants of Health   Financial Resource Strain: Low Risk  (11/29/2021)   Overall Financial Resource Strain  (CARDIA)    Difficulty of Paying Living Expenses: Not hard at all  Food Insecurity: No Food Insecurity (11/29/2021)   Hunger Vital Sign    Worried About Running Out of Food in the Last Year: Never true    Ran Out of Food in the Last Year: Never true  Transportation Needs: No Transportation Needs (11/29/2021)   PRAPARE - Hydrologist (Medical): No    Lack of Transportation (Non-Medical): No  Physical Activity: Insufficiently Active (11/29/2021)   Exercise Vital Sign    Days of Exercise per Week: 7 days    Minutes of Exercise per Session: 20 min  Stress: No Stress Concern Present (11/29/2021)   [place]    Feeling of Stress : Only a little  Social Connections: Moderately Isolated (11/29/2021)   Social Connection and Isolation Panel [NHANES]    Frequency of Communication with Friends and Family: More than three times a week    Frequency of Social Gatherings with Friends and Family: Twice a week    Attends Religious Services: Never    Marine scientist or Organizations: Yes    Attends Music therapist: More than 4 times per year    Marital Status: Widowed  Intimate Partner Violence: Not At Risk (11/29/2021)   Humiliation, Afraid, Rape, and Kick questionnaire    Fear of Current or Ex-Partner: No    Emotionally Abused: No    Physically Abused: No    Sexually Abused: No   Past Surgical History:  Procedure Laterality Date   CARPAL TUNNEL RELEASE Right 2002   ILEOSTOMY N/A 12/27/2012   Procedure: DIVERTING LOOP ILEOSTOMY;  Surgeon: Adin Hector, MD;  Location: [facility];  Service: General;  Laterality: N/A;   ILEOSTOMY CLOSURE N/A 06/28/2013   Procedure: loop ILEOSTOMY TAKEDOWN examination of anesthesia ;  Surgeon: Adin Hector, MD;  Location: [facility];  Service: General;  Laterality: N/A;   INGUINAL HERNIA REPAIR Left    INGUINAL HERNIA REPAIR Bilateral 08/13/2015   Procedure:  LAPAROSCOPIC BILATERAL INGUINAL HERNIA REPAIR WITH MESH;  Surgeon: Michael Boston, MD;  Location: [facility];  Service: General;  Laterality: Bilateral;   INSERTION OF MESH Bilateral 08/13/2015   Procedure: INSERTION OF MESH;  Surgeon: Michael Boston, MD;  Location: [facility];  Service: General;  Laterality: Bilateral;   LAPAROSCOPIC LOW ANTERIOR RESCECTION WITH COLOANAL ANASTOMOSIS N/A 12/27/2012   Procedure: LAPAROSCOPIC LOW ANTERIOR RESCECTION WITH COLOANAL ANASTOMOSIS;  Surgeon: Adin Hector, MD;  Location: [facility];  Service: General;  Laterality: N/A;   LAPAROSCOPIC LOW ANTERIOR RESECTION N/A 12/27/2012   Procedure: LAPAROSCOPIC LOW ANTERIOR RESECTION, COLO-ANAL ANASTOMOSIS, DIVERTING LOOP ILEOSTOMY,SPLENIC FLEXURE MOBILIZATION, PRIMARY INCARCERATED UMBILICAL  HERNIA REPAIR;  Surgeon: Adin Hector, MD;  Location[facility];  Service: General;  Laterality: N/A;   TEE WITHOUT CARDIOVERSION N/A 07/10/2013   Procedure: TRANSESOPHAGEAL ECHOCARDIOGRAM (TEE);  Surgeon: Candee Furbish, MD;  Location: [facility];  Service: Cardiovascular;  Laterality: N/A;   TEE WITHOUT CARDIOVERSION N/A 08/21/2013   Procedure: TRANSESOPHAGEAL ECHOCARDIOGRAM (TEE);  Surgeon: Sanda Klein, MD;  Location: [facility];  Service: Cardiovascular;  Laterality: N/A;   TEE WITHOUT CARDIOVERSION N/A 07/16/2019   Procedure: TRANSESOPHAGEAL ECHOCARDIOGRAM (TEE) WITH PROPOFOL;  Surgeon: Arnoldo Lenis, MD;  Location: [facility];  Service: Endoscopy;  Laterality: N/A;   TONSILLECTOMY     as child   UMBILICAL HERNIA REPAIR  12/27/2012   Procedure: PRIMARY REPAIR INCARCERATED UMBILICAL HERNIA;  Surgeon: Adin Hector, MD;  Location: [facility];  Service: General;;   Family History  Problem Relation Age of Onset   Cancer Mother        abdominal ca ?ovarian?   Heart disease Father    Cancer Sister        liver cancer   Heart disease Brother    COPD Brother    Healthy Son    Healthy Son     Current Outpatient Medications:    acetaminophen  (TYLENOL) 500 MG tablet, Take 1,000 mg by mouth every 6 (six) hours as needed for headache. , Disp: , Rfl:    clotrimazole-betamethasone (LOTRISONE) cream, Apply 1 application topically 2 (two) times daily. To neck for rash for up to 2 weeks., Disp: 30 g, Rfl: 0   diclofenac Sodium (VOLTAREN) 1 % GEL, APPLY 4 GRAMS TOPICALLY 4 TIMES A DAY FOR JOINT PAIN, Disp: 400 g, Rfl: 0   hydrochlorothiazide (HYDRODIURIL) 25 MG tablet, TAKE 1 TABLET BY MOUTH ONCE DAILY. STOPPING CHLORTHALIDONE, Disp: 90 tablet, Rfl: 3   hydrocortisone (ANUSOL-HC) 2.5 % rectal cream, INSERT 1 APPLICATORFUL OF CREAM RECTALLY TWICE DAILY AS NEEDED FOR HEMORRHOIDS OR ANAL ITCHING FOR 7 TO 10 DAYS, Disp: 28 g, Rfl: 0   ketoconazole (NIZORAL) 2 % cream, Apply 1 application topically 2 (two) times daily., Disp: 60 g, Rfl: 1   lisinopril (ZESTRIL) 40 MG tablet, Take 1 tablet (40 mg total) by mouth daily., Disp: 90 tablet, Rfl: 3   Multiple Vitamin (MULTIVITAMIN WITH MINERALS) TABS, Take 1 tablet by mouth every morning. , Disp: , Rfl:    Olopatadine HCl 0.2 % SOLN, Instill 1 drop in each eye ONCE daily., Disp: 2.5 mL, Rfl: 12   oxymetazoline (AFRIN) 0.05 % nasal spray, Place 1 spray into both nostrils 2 (two) times daily as needed for congestion., Disp: , Rfl:    pravastatin (PRAVACHOL) 20 MG tablet, Take 1 tablet (20 mg total) by mouth daily., Disp: 90 tablet, Rfl: 3   Probiotic Product (DIGESTIVE ADVANTAGE PO), Take 1 tablet by mouth daily., Disp: , Rfl:    psyllium (METAMUCIL) 58.6 % packet, Take 1 packet by mouth daily., Disp: , Rfl:    triamcinolone cream (KENALOG) 0.1 %, APPLY  CREAM EXTERNALLY THREE TIMES DAILY AS NEEDED, Disp: 80 g, Rfl: 0  Allergies  Allergen Reactions   Shrimp [Shellfish Allergy] Hives, Nausea And Vomiting and Rash    "only shrimp" - crustaceans - blisters, n/v      ROS: Review of Systems Pertinent items noted in HPI and remainder of comprehensive ROS otherwise negative.    Physical exam BP 140/82    Pulse 75   Temp 97.6 F (36.4 C) (Oral)   Resp 20  Ht 5' 10"  (1.778 m)   Wt 184 lb (83.5 kg)   SpO2 97%   BMI 26.40 kg/m  General appearance: alert, cooperative, appears stated age, and no distress Head: Normocephalic, without obvious abnormality, atraumatic Eyes: negative findings: lids and lashes normal, conjunctivae and sclerae normal, corneas clear, and pupils equal, round, reactive to light and accomodation Ears: normal TM's and external ear canals both ears Nose: Nares normal. Septum midline. Mucosa normal. No drainage or sinus tenderness. Throat: lips, mucosa, and tongue normal; teeth and gums normal Neck: no adenopathy, no carotid bruit, supple, symmetrical, trachea midline, and thyroid not enlarged, symmetric, no tenderness/mass/nodules Back: symmetric, no curvature. ROM normal. No CVA tenderness. Lungs: clear to auscultation bilaterally Chest wall: no tenderness Heart:  Systolic heart murmur noted along the left sternal border that does radiate to the carotid Abdomen: soft, non-tender; bowel sounds normal; no masses,  no organomegaly Extremities: extremities normal, atraumatic, no cyanosis or edema Pulses: 2+ and symmetric Skin:  Senile purpura noted Lymph nodes: Cervical, supraclavicular, and axillary nodes normal. Neurologic: Grossly normal Psych: Mood stable, speech normal, affect appropriate    12/01/2021    9:01 AM 11/29/2021    9:04 AM 06/01/2021    9:18 AM  Depression screen PHQ 2/9  Decreased Interest 0 0 0  Down, Depressed, Hopeless 0 0 0  PHQ - 2 Score 0 0 0  Altered sleeping 0    Tired, decreased energy 0    Change in appetite 0    Feeling bad or failure about yourself  0    Trouble concentrating 0    Moving slowly or fidgety/restless 0    Suicidal thoughts 0    PHQ-9 Score 0        12/01/2021    9:01 AM 06/01/2021    9:18 AM 09/04/2019    9:27 AM 07/24/2019    8:18 AM  GAD 7 : Generalized Anxiety Score  Nervous, Anxious, on Edge 0 0 1 3   Control/stop worrying 0 0 0 3  Worry too much - different things 0 0 0 3  Trouble relaxing 0 0 2 2  Restless 0 0 1 2  Easily annoyed or irritable 0 0 1 2  Afraid - awful might happen 0 0 1 1  Total GAD 7 Score 0 0 6 16  Anxiety Difficulty  Not difficult at all Somewhat difficult       Assessment/ Plan: Tyler Diaz here for annual physical exam.   Annual physical exam  Hypertriglyceridemia - Plan: CMP14+EGFR, TSH, Lipid Panel, pravastatin (PRAVACHOL) 20 MG tablet  Essential hypertension - Plan: CMP14+EGFR  Senile purpura ([place]) - Plan: CBC  Macrocytic anemia - Plan: CBC  Elevated PSA - Plan: PSA  History of colorectal cancer - Plan: diphenoxylate-atropine (LOMOTIL) 2.5-0.025 MG tablet  Chronic diarrhea - Plan: diphenoxylate-atropine (LOMOTIL) 2.5-0.025 MG tablet  Neuropathic pain - Plan: gabapentin (NEURONTIN) 100 MG capsule  Check fasting labs.  Patient declined shingles vaccination.  Blood pressure is controlled upon recheck.  No changes  Check CBC given senile purpura which is likely due to advanced age  Check PSA given history of elevation in the past.  Chronic diarrhea likely secondary to treatments for colon cancer.  Will give him Lomotil but I discussed the high risk of this after age 61.  We discussed red flag signs and symptoms warranting discontinuation.  Use extremely sparingly.  National narcotic database reviewed and there were no red flags  Gabapentin prescribed for what sounds like neuropathic  pain that is refractory to OTC treatments.  He has normal renal function so we could consider an oral NSAID if he would be willing to trial.  Caution sedation with the gabapentin.  Follow-up as needed for further refills  Counseled on healthy lifestyle choices, including diet (rich in fruits, vegetables and lean meats and low in salt and simple carbohydrates) and exercise (at least 30 minutes of moderate physical activity daily).  Patient to follow up in  84m Kazden Largo M. GLajuana Ripple DO

## 2021-12-02 LAB — CMP14+EGFR
ALT: 24 IU/L (ref 0–44)
AST: 21 IU/L (ref 0–40)
Albumin/Globulin Ratio: 2.4 — ABNORMAL HIGH (ref 1.2–2.2)
Albumin: 4.4 g/dL (ref 3.7–4.7)
Alkaline Phosphatase: 47 IU/L (ref 44–121)
BUN/Creatinine Ratio: 21 (ref 10–24)
BUN: 23 mg/dL (ref 8–27)
Bilirubin Total: 1 mg/dL (ref 0.0–1.2)
CO2: 24 mmol/L (ref 20–29)
Calcium: 9.2 mg/dL (ref 8.6–10.2)
Chloride: 104 mmol/L (ref 96–106)
Creatinine, Ser: 1.11 mg/dL (ref 0.76–1.27)
Globulin, Total: 1.8 g/dL (ref 1.5–4.5)
Glucose: 93 mg/dL (ref 70–99)
Potassium: 4.3 mmol/L (ref 3.5–5.2)
Sodium: 141 mmol/L (ref 134–144)
Total Protein: 6.2 g/dL (ref 6.0–8.5)
eGFR: 68 mL/min/{1.73_m2} (ref >59)

## 2021-12-02 LAB — CBC
Hematocrit: 38.3 % (ref 37.5–51.0)
Hemoglobin: 13.2 g/dL (ref 13.0–17.7)
MCH: 34.4 pg — ABNORMAL HIGH (ref 26.6–33.0)
MCHC: 34.5 g/dL (ref 31.5–35.7)
MCV: 100 fL — ABNORMAL HIGH (ref 79–97)
Platelets: 166 10*3/uL (ref 150–450)
RBC: 3.84 x10E6/uL — ABNORMAL LOW (ref 4.14–5.80)
RDW: 13.3 % (ref 11.6–15.4)
WBC: 8.3 10*3/uL (ref 3.4–10.8)

## 2021-12-02 LAB — LIPID PANEL
Chol/HDL Ratio: 3.7 ratio (ref 0.0–5.0)
Cholesterol, Total: 139 mg/dL (ref 100–199)
HDL: 38 mg/dL — ABNORMAL LOW (ref >39)
LDL Chol Calc (NIH): 77 mg/dL (ref 0–99)
Triglycerides: 138 mg/dL (ref 0–149)
VLDL Cholesterol Cal: 24 mg/dL (ref 5–40)

## 2021-12-02 LAB — TSH: TSH: 3.26 u[IU]/mL (ref 0.450–4.500)

## 2021-12-02 LAB — PSA: Prostate Specific Ag, Serum: 3.1 ng/mL (ref 0.0–4.0)

## 2021-12-06 ENCOUNTER — Telehealth: Payer: Self-pay | Admitting: Family Medicine

## 2021-12-06 ENCOUNTER — Encounter: Payer: Self-pay | Admitting: Emergency Medicine

## 2021-12-08 NOTE — Telephone Encounter (Signed)
LM informing patient of labs

## 2021-12-28 ENCOUNTER — Ambulatory Visit: Payer: PPO | Admitting: Cardiology

## 2021-12-28 ENCOUNTER — Encounter: Payer: Self-pay | Admitting: Cardiology

## 2021-12-28 VITALS — BP 132/80 | HR 72 | Ht 70.0 in | Wt 182.8 lb

## 2021-12-28 DIAGNOSIS — I340 Nonrheumatic mitral (valve) insufficiency: Secondary | ICD-10-CM | POA: Diagnosis not present

## 2021-12-28 DIAGNOSIS — E782 Mixed hyperlipidemia: Secondary | ICD-10-CM

## 2021-12-28 DIAGNOSIS — I10 Essential (primary) hypertension: Secondary | ICD-10-CM | POA: Diagnosis not present

## 2021-12-28 NOTE — Patient Instructions (Signed)
Medication Instructions:  Continue all current medications.  Labwork: none  Testing/Procedures: Your physician has requested that you have an echocardiogram. Echocardiography is a painless test that uses sound waves to create images of your heart. It provides your doctor with information about the size and shape of your heart and how well your heart's chambers and valves are working. This procedure takes approximately one hour. There are no restrictions for this procedure - Due in September   Follow-Up: 6 months   Any Other Special Instructions Will Be Listed Below (If Applicable).   If you need a refill on your cardiac medications before your next appointment, please call your pharmacy.

## 2021-12-28 NOTE — Addendum Note (Signed)
Addended by: Merlene Laughter on: 12/28/2021 11:04 AM   Modules accepted: Orders

## 2021-12-28 NOTE — Progress Notes (Signed)
Clinical Summary Tyler Diaz is a 76 y.o.male seen today for follow up of the following medical problems.      1. Mitral regurgitation - 11/2017 echo LVEF 60%, moderate to severe MR. Partial flail cusp of posterior leaflet. LVIDs 3.6 - 08/2013 TEE flail segment of P2 scallop posterior leaflet due to ruptured chordae, moderate to severe MR   03/2019 TTE There is posterior leaflet prolapse and  a calcified density on the atrial side of the posterior leaflet that could  represent a partial flail subsegment of the posterior leaflet or perhaps  an old vegetation. There is  eccentric, anteriorly directed, moderate to severe mitral valve  Regurgitation     07/2019 TEE: Ruptured chordae attached to the posterior leaflet with  partial prolapse of P2. Chronic finding when compared to 07/2013 TEE. There  is eccentric anterior directed MR thats difficult to quantify due to  eccentricity, appears moderate to  severe.     02/2021 echo: LVEF 60-65%, no WMAs, indet diastolic, normal RV, moderate MR, LVIDs 2.9 - no recent SOB, no DOE. Mild LE edema at times.  -    2. HTN -reports some white coat HTN.  - home bp's usually around 120s/60s-70s   3. Hyperlipidemia   - compliant with pravastatin - 11/2021 TC 139 TG 138 HDL 38 LDL 77   4. Colorectal cancer - s/p surgery, currently cancer free.      5. Recent Sepsis/Bacteremia - recent admission 07/2019 with sepsis and staph aureus bacteremia - TEE without signs of endocarditis   Past Medical History:  Diagnosis Date   Anemia    hx of   Arthritis    hands/knees   Blood clot in vein    RT ARM WITH PICC LINE    C. difficile colitis 08/19/2013   Colon cancer ([place]) 07/18/12 bx   rectum=Invasive adenocarcinoma w/ extracellular mucin,polyp=benign   GERD (gastroesophageal reflux disease)    OCCASIONAL   Hearing loss    partial greater on left   Heart murmur    Hematochezia    recent   Herpes zoster - R flank - with severe pain 03/11/2013    History of shingles    Hyperlipidemia    Hypertension    Hypothyroidism as child   hx of   Ileostomy - diverting loop - s/p takedown 06/28/2013 01/14/2013   Mitral regurgitation due to partially flail posterior mitral valve leaflet    Nocturia    Numbness    TOES / FINGERS DUE TO CHEMO   Peripheral vascular disease (HCC)    neuropathy due to cancer treatment (fingers and toes)   Radiation    FOR COLON CANCER   Rectal cancer ([place])    Rheumatic fever as child   hx   Sinusitis    seasonal   Staphylococcus aureus bacteremia 07/12/2019   Staphylococcus aureus sepsis ([place]) 07/17/2019   Tinnitus    left ear   Umbilical hernia, incarcerated - s/p primary repair July 2014 01/03/2013     Allergies  Allergen Reactions   Shrimp [Shellfish Allergy] Hives, Nausea And Vomiting and Rash    "only shrimp" - crustaceans - blisters, n/v      Current Outpatient Medications  Medication Sig Dispense Refill   acetaminophen (TYLENOL) 500 MG tablet Take 1,000 mg by mouth every 6 (six) hours as needed for headache.      clotrimazole-betamethasone (LOTRISONE) cream Apply 1 application topically 2 (two) times daily. To neck for rash for up to 2  weeks. 30 g 0   diclofenac Sodium (VOLTAREN) 1 % GEL APPLY 4 GRAMS TOPICALLY 4 TIMES A DAY FOR JOINT PAIN 400 g 0   diphenoxylate-atropine (LOMOTIL) 2.5-0.025 MG tablet Take 1 tablet by mouth daily as needed for diarrhea or loose stools. 30 tablet 1   gabapentin (NEURONTIN) 100 MG capsule Take 1 capsule (100 mg total) by mouth 2 (two) times daily as needed (nerve pain). 60 capsule 12   hydrochlorothiazide (HYDRODIURIL) 25 MG tablet TAKE 1 TABLET BY MOUTH ONCE DAILY. STOPPING CHLORTHALIDONE 90 tablet 3   hydrocortisone (ANUSOL-HC) 2.5 % rectal cream INSERT 1 APPLICATORFUL OF CREAM RECTALLY TWICE DAILY AS NEEDED FOR HEMORRHOIDS OR ANAL ITCHING FOR 7 TO 10 DAYS 28 g 0   ketoconazole (NIZORAL) 2 % cream Apply 1 application topically 2 (two) times daily. 60 g 1    lisinopril (ZESTRIL) 40 MG tablet Take 1 tablet (40 mg total) by mouth daily. 90 tablet 3   Multiple Vitamin (MULTIVITAMIN WITH MINERALS) TABS Take 1 tablet by mouth every morning.      Olopatadine HCl 0.2 % SOLN Instill 1 drop in each eye ONCE daily. 2.5 mL 12   oxymetazoline (AFRIN) 0.05 % nasal spray Place 1 spray into both nostrils 2 (two) times daily as needed for congestion.     pravastatin (PRAVACHOL) 20 MG tablet Take 1 tablet (20 mg total) by mouth daily. 90 tablet 3   Probiotic Product (DIGESTIVE ADVANTAGE PO) Take 1 tablet by mouth daily.     psyllium (METAMUCIL) 58.6 % packet Take 1 packet by mouth daily.     triamcinolone cream (KENALOG) 0.1 % APPLY  CREAM EXTERNALLY THREE TIMES DAILY AS NEEDED 80 g 0   No current facility-administered medications for this visit.     Past Surgical History:  Procedure Laterality Date   CARPAL TUNNEL RELEASE Right 2002   ILEOSTOMY N/A 12/27/2012   Procedure: DIVERTING LOOP ILEOSTOMY;  Surgeon: Adin Hector, MD;  Location: [facility];  Service: General;  Laterality: N/A;   ILEOSTOMY CLOSURE N/A 06/28/2013   Procedure: loop ILEOSTOMY TAKEDOWN examination of anesthesia ;  Surgeon: Adin Hector, MD;  Location: [facility];  Service: General;  Laterality: N/A;   INGUINAL HERNIA REPAIR Left    INGUINAL HERNIA REPAIR Bilateral 08/13/2015   Procedure: LAPAROSCOPIC BILATERAL INGUINAL HERNIA REPAIR WITH MESH;  Surgeon: Michael Boston, MD;  Location: [facility];  Service: General;  Laterality: Bilateral;   INSERTION OF MESH Bilateral 08/13/2015   Procedure: INSERTION OF MESH;  Surgeon: Michael Boston, MD;  Location: [facility];  Service: General;  Laterality: Bilateral;   LAPAROSCOPIC LOW ANTERIOR RESCECTION WITH COLOANAL ANASTOMOSIS N/A 12/27/2012   Procedure: LAPAROSCOPIC LOW ANTERIOR RESCECTION WITH COLOANAL ANASTOMOSIS;  Surgeon: Adin Hector, MD;  Location: [facility];  Service: General;  Laterality: N/A;   LAPAROSCOPIC LOW ANTERIOR RESECTION N/A 12/27/2012   Procedure:  LAPAROSCOPIC LOW ANTERIOR RESECTION, COLO-ANAL ANASTOMOSIS, DIVERTING LOOP ILEOSTOMY,SPLENIC FLEXURE MOBILIZATION, PRIMARY INCARCERATED UMBILICAL HERNIA REPAIR;  Surgeon: Adin Hector, MD;  Location: [facility];  Service: General;  Laterality: N/A;   TEE WITHOUT CARDIOVERSION N/A 07/10/2013   Procedure: TRANSESOPHAGEAL ECHOCARDIOGRAM (TEE);  Surgeon: Candee Furbish, MD;  Location: [facility];  Service: Cardiovascular;  Laterality: N/A;   TEE WITHOUT CARDIOVERSION N/A 08/21/2013   Procedure: TRANSESOPHAGEAL ECHOCARDIOGRAM (TEE);  Surgeon: Sanda Klein, MD;  Location: [facility] ENDOSCOPY;  Service: Cardiovascular;  Laterality: N/A;   TEE WITHOUT CARDIOVERSION N/A 07/16/2019   Procedure: TRANSESOPHAGEAL ECHOCARDIOGRAM (TEE) WITH PROPOFOL;  Surgeon: Carlyle Dolly  F, MD;  Location: [facility];  Service: Endoscopy;  Laterality: N/A;   TONSILLECTOMY     as child   UMBILICAL HERNIA REPAIR  12/27/2012   Procedure: PRIMARY REPAIR INCARCERATED UMBILICAL HERNIA;  Surgeon: Adin Hector, MD;  Location: [facility];  Service: General;;     Allergies  Allergen Reactions   Shrimp [Shellfish Allergy] Hives, Nausea And Vomiting and Rash    "only shrimp" - crustaceans - blisters, n/v       Family History  Problem Relation Age of Onset   Cancer Mother        abdominal ca ?ovarian?   Heart disease Father    Cancer Sister        liver cancer   Heart disease Brother    COPD Brother    Healthy Son    Healthy Son      Social History Mr. Dosher reports that he quit smoking about 49 years ago. His smoking use included cigarettes. He has a 30.00 pack-year smoking history. He has never used smokeless tobacco. Mr. Benish reports no history of alcohol use.   Review of Systems CONSTITUTIONAL: No weight loss, fever, chills, weakness or fatigue.  HEENT: Eyes: No visual loss, blurred vision, double vision or yellow sclerae.No hearing loss, sneezing, congestion, runny nose or sore throat.  SKIN: No rash or itching.   CARDIOVASCULAR: per hpi RESPIRATORY: No shortness of breath, cough or sputum.  GASTROINTESTINAL: No anorexia, nausea, vomiting or diarrhea. No abdominal pain or blood.  GENITOURINARY: No burning on urination, no polyuria NEUROLOGICAL: No headache, dizziness, syncope, paralysis, ataxia, numbness or tingling in the extremities. No change in bowel or bladder control.  MUSCULOSKELETAL: No muscle, back pain, joint pain or stiffness.  LYMPHATICS: No enlarged nodes. No history of splenectomy.  PSYCHIATRIC: No history of depression or anxiety.  ENDOCRINOLOGIC: No reports of sweating, cold or heat intolerance. No polyuria or polydipsia.  Marland Kitchen   Physical Examination Today's Vitals   12/28/21 0830  BP: 132/80  Pulse: 72  SpO2: 96%  Weight: 182 lb 12.8 oz (82.9 kg)  Height: '5\' 10"'$  (1.778 m)   Body mass index is 26.23 kg/m.  Gen: resting comfortably, no acute distress HEENT: no scleral icterus, pupils equal round and reactive, no palptable cervical adenopathy,  CV: RRR, 3/6 systolic murmur apex, no jvd Resp: Clear to auscultation bilaterally GI: abdomen is soft, non-tender, non-distended, normal bowel sounds, no hepatosplenomegaly MSK: extremities are warm, no edema.  Skin: warm, no rash Neuro:  no focal deficits Psych: appropriate affect   Diagnostic Studies  03/2019 echo IMPRESSIONS     1. Left ventricular ejection fraction, by visual estimation, is 55 to  60%. The left ventricle has normal function. Normal left ventricular size.  There is mildly increased left ventricular hypertrophy.   2. Global right ventricle has normal systolic function.The right  ventricular size is normal. No increase in right ventricular wall  thickness.   3. Left atrial size was mildly dilated.   4. Right atrial size was normal.   5. Mild aortic valve annular calcification.   6. The mitral valve is abnormal. There is posterior leaflet prolapse and  a calcified density on the atrial side of the  posterior leaflet that could  represent a partial flail subsegment of the posterior leaflet or perhaps  an old vegetation. There is  eccentric, anteriorly directed, moderate to severe mitral valve  regurgitation. PISA calculations are challenging with very eccentric jet,  but one image suggests MR radius is at  least 1 cm which is consistent with  severe regurgitation. Suggest TEE for  further evaluation if clinically indicated.   7. The tricuspid valve is grossly normal. Tricuspid valve regurgitation  is mild.   8. The aortic valve is tricuspid Aortic valve regurgitation is trivial by  color flow Doppler.   9. The pulmonic valve was grossly normal. Pulmonic valve regurgitation is  mild by color flow Doppler.  10. Normal pulmonary artery systolic pressure.  11. The tricuspid regurgitant velocity is 2.36 m/s, and with an assumed  right atrial pressure of 3 mmHg, the estimated right ventricular systolic  pressure is normal at 25.3 mmHg.  12. The inferior vena cava is normal in size with greater than 50%  respiratory variability, suggesting right atrial pressure of 3 mmHg.   In comparison to the previous echocardiogram(s): Previous Echo at [facility]  showed LV EF 60%, mild LAE, moderate to severe MR with a wall-impinging MR  jet and a flail cusp of the posterior leaflet. Mild MR, PI and dilated  IVC. These images were not able  to be viewed directly.  FINDINGS   Left Ventricle: Left ventricular ejection fraction, by visual estimation,  is 55 to 60%. The left ventricle has normal function. There is mildly  increased left ventricular hypertrophy. Normal left ventricular size.  Spectral Doppler shows Left ventricular   diastolic parameters were normal pattern of LV diastolic filling.      12/7937 TEE IMPRESSIONS     1. Left ventricular ejection fraction, by estimation, is 60 to 65%. The  left ventricle has normal function. The left ventrical has no regional  wall motion abnormalities.  Left ventricular diastolic function could not  be evaluated.   2. Right ventricular systolic function is normal. The right ventricular  size is normal.   3. Left atrial size was moderately dilated. No left atrial/left atrial  appendage thrombus was detected.   4. Right atrial size was moderately dilated.   5. Ruptured chordae attached to the posterior leaflet with partial  prolapse of P2. Chronic finding when compared to 07/2013 TEE. There is  eccentric anterior directed MR thats difficult to quantify due to  eccentricity, appears moderate to severe. . The  mitral valve is abnormal. Moderate to severe MR mitral valve  regurgitation. No evidence of mitral stenosis.   6. Tricuspid valve regurgitation is mild to moderate.   7. The aortic valve is tricuspid. Aortic valve regurgitation is trivial .  No aortic stenosis is present.     02/2021 echo IMPRESSIONS     1. Left ventricular ejection fraction, by estimation, is 60 to 65%. The  left ventricle has normal function. The left ventricle has no regional  wall motion abnormalities. Left ventricular diastolic parameters are  indeterminate.   2. Right ventricular systolic function is normal. The right ventricular  size is normal. There is mildly elevated pulmonary artery systolic  pressure.   3. Left atrial size was mildly dilated.   4. There is prolapse of a portion of the posterior MV leaflet with  resultant eccentric anterior MR. The MR VC is 0.6 cm. The MV/AV VTI ratio  is 1.3 supporting moderate MR. . The mitral valve is abnormal. Moderate  mitral valve regurgitation. No evidence  of mitral stenosis.   5. The aortic valve is tricuspid. Aortic valve regurgitation is mild. No  aortic stenosis is present.   6. The inferior vena cava is normal in size with greater than 50%  respiratory variability, suggesting right atrial pressure  of 3 mmHg.    Assessment and Plan  1. Mitral regurgitation - moderate to severe by echos over time  without evidence of LV dysfunction or enlargement -denies symptoms, repeat echo 02/2022     2. HTN - he prefers HCTZ instead of chlorthaldone due to cost - has white coat HTN often elevated in clinic. Home numbers remain at goal - at goal, continue current meds   3. Hyperlipidemia -at goal, continue current meds   F/u 6 months      Arnoldo Lenis, M.D.

## 2022-01-10 ENCOUNTER — Other Ambulatory Visit: Payer: Self-pay | Admitting: Family Medicine

## 2022-01-10 NOTE — Telephone Encounter (Signed)
Last office visit 12/01/21 Last refill 11/09/21, 28 grams, no refills

## 2022-02-08 ENCOUNTER — Other Ambulatory Visit: Payer: PPO

## 2022-03-02 ENCOUNTER — Ambulatory Visit: Payer: PPO | Attending: Cardiology

## 2022-03-02 DIAGNOSIS — I340 Nonrheumatic mitral (valve) insufficiency: Secondary | ICD-10-CM

## 2022-03-02 LAB — ECHOCARDIOGRAM COMPLETE
AR max vel: 3.1 cm2
AV Area VTI: 3.34 cm2
AV Area mean vel: 3.35 cm2
AV Mean grad: 2.7 mmHg
AV Peak grad: 5.6 mmHg
Ao pk vel: 1.18 m/s
Area-P 1/2: 3.06 cm2
Calc EF: 58.7 %
MV M vel: 4.87 m/s
MV Peak grad: 95 mmHg
P 1/2 time: 1358 msec
Radius: 0.83 cm
S' Lateral: 2.63 cm
Single Plane A2C EF: 58.1 %
Single Plane A4C EF: 60.4 %

## 2022-03-08 ENCOUNTER — Other Ambulatory Visit: Payer: Self-pay | Admitting: Family Medicine

## 2022-03-15 ENCOUNTER — Telehealth: Payer: Self-pay | Admitting: Cardiology

## 2022-03-15 NOTE — Telephone Encounter (Signed)
Laurine Blazer, LPN  68/37/2902  1:11 PM EDT Back to Top    Notified, copy to pcp.    Laurine Blazer, LPN  55/07/800  2:33 PM EDT     Left message to return call.   Arnoldo Lenis, MD  03/07/2022 12:08 PM EDT     Echo shows mitral valve remains moderately to severelty leaky, overall stable. Heart pumping function remains strong. Continue to monitor at this time   Zandra Abts MD

## 2022-03-15 NOTE — Telephone Encounter (Signed)
Pt was returning call to [facility] for Echo results   Best number is [phone].  He stated he will not be home and would like a message to be left with the results if he does not answer

## 2022-03-30 ENCOUNTER — Ambulatory Visit (INDEPENDENT_AMBULATORY_CARE_PROVIDER_SITE_OTHER): Payer: PPO

## 2022-03-30 DIAGNOSIS — Z23 Encounter for immunization: Secondary | ICD-10-CM

## 2022-06-03 ENCOUNTER — Ambulatory Visit (INDEPENDENT_AMBULATORY_CARE_PROVIDER_SITE_OTHER): Payer: PPO | Admitting: Family Medicine

## 2022-06-03 ENCOUNTER — Encounter: Payer: Self-pay | Admitting: Family Medicine

## 2022-06-03 VITALS — BP 129/75 | HR 77 | Temp 97.1°F | Resp 20 | Ht 70.0 in | Wt 179.0 lb

## 2022-06-03 DIAGNOSIS — M792 Neuralgia and neuritis, unspecified: Secondary | ICD-10-CM

## 2022-06-03 DIAGNOSIS — I10 Essential (primary) hypertension: Secondary | ICD-10-CM

## 2022-06-03 MED ORDER — GABAPENTIN 100 MG PO CAPS: 200.0000 mg | ORAL_CAPSULE | Freq: Two times a day (BID) | ORAL | 3 refills | Status: DC | PRN

## 2022-06-03 NOTE — Patient Instructions (Signed)
I increased the gabapentin to 2 capsules twice daily.  I'm so glad that this is working for you. I'll see you back in 6 months.  FAST for that appointment. It will be your physical.

## 2022-06-03 NOTE — Progress Notes (Signed)
Subjective: [id number] pain PCP: Janora Norlander, DO [id number] Tyler Diaz is a 76 y.o. male presenting to clinic today for:  1. Neuropathic pain Patient reports that 100 mg twice daily was not especially helpful for the neuropathic pain in his hands but when he took them as a single dose this was really helpful.  He denies any excessive daytime sedation, falls, confusion or lower extremity edema.  He would like to continue the 200 mg but would like to do it twice daily if possible as he does find the nighttime need for this medication.  He also incidentally notes that it helps some with his knees.   ROS: Per HPI  Allergies  Allergen Reactions   Shrimp [Shellfish Allergy] Hives, Nausea And Vomiting and Rash    "only shrimp" - crustaceans - blisters, n/v    Past Medical History:  Diagnosis Date   Anemia    hx of   Arthritis    hands/knees   Blood clot in vein    RT ARM WITH PICC LINE    C. difficile colitis 08/19/2013   Colon cancer ([place]) 07/18/12 bx   rectum=Invasive adenocarcinoma w/ extracellular mucin,polyp=benign   GERD (gastroesophageal reflux disease)    OCCASIONAL   Hearing loss    partial greater on left   Heart murmur    Hematochezia    recent   Herpes zoster - R flank - with severe pain 03/11/2013   History of shingles    Hyperlipidemia    Hypertension    Hypothyroidism as child   hx of   Ileostomy - diverting loop - s/p takedown 06/28/2013 01/14/2013   Mitral regurgitation due to partially flail posterior mitral valve leaflet    Nocturia    Numbness    TOES / FINGERS DUE TO CHEMO   Peripheral vascular disease (HCC)    neuropathy due to cancer treatment (fingers and toes)   Radiation    FOR COLON CANCER   Rectal cancer ([place])    Rheumatic fever as child   hx   Sinusitis    seasonal   Staphylococcus aureus bacteremia 07/12/2019   Staphylococcus aureus sepsis ([place]) 07/17/2019   Tinnitus    left ear   Umbilical hernia, incarcerated - s/p primary repair  July 2014 01/03/2013    Current Outpatient Medications:    acetaminophen (TYLENOL) 500 MG tablet, Take 1,000 mg by mouth every 6 (six) hours as needed for headache. , Disp: , Rfl:    clotrimazole-betamethasone (LOTRISONE) cream, Apply 1 application topically 2 (two) times daily. To neck for rash for up to 2 weeks., Disp: 30 g, Rfl: 0   diclofenac Sodium (VOLTAREN) 1 % GEL, APPLY 4 GRAMS TOPICALLY 4 TIMES A DAY FOR JOINT PAIN, Disp: 400 g, Rfl: 0   diphenoxylate-atropine (LOMOTIL) 2.5-0.025 MG tablet, Take 1 tablet by mouth daily as needed for diarrhea or loose stools., Disp: 30 tablet, Rfl: 1   gabapentin (NEURONTIN) 100 MG capsule, Take 1 capsule (100 mg total) by mouth 2 (two) times daily as needed (nerve pain)., Disp: 60 capsule, Rfl: 12   hydrochlorothiazide (HYDRODIURIL) 25 MG tablet, TAKE 1 TABLET BY MOUTH ONCE DAILY. STOPPING CHLORTHALIDONE, Disp: 90 tablet, Rfl: 3   ketoconazole (NIZORAL) 2 % cream, Apply 1 application topically 2 (two) times daily., Disp: 60 g, Rfl: 1   lisinopril (ZESTRIL) 40 MG tablet, Take 1 tablet (40 mg total) by mouth daily., Disp: 90 tablet, Rfl: 3   Multiple Vitamin (MULTIVITAMIN WITH MINERALS) TABS, Take 1 tablet  by mouth every morning. , Disp: , Rfl:    Olopatadine HCl 0.2 % SOLN, Instill 1 drop in each eye ONCE daily., Disp: 2.5 mL, Rfl: 12   oxymetazoline (AFRIN) 0.05 % nasal spray, Place 1 spray into both nostrils 2 (two) times daily as needed for congestion., Disp: , Rfl:    pravastatin (PRAVACHOL) 20 MG tablet, Take 1 tablet (20 mg total) by mouth daily., Disp: 90 tablet, Rfl: 3   Probiotic Product (DIGESTIVE ADVANTAGE PO), Take 1 tablet by mouth daily., Disp: , Rfl:    PROCTO-MED HC 2.5 % rectal cream, INSERT 1 APPLICATORFUL OF CREAM RECTALLY TWICE DAILY AS NEEDED FOR HEMORRHOIDS OR ANAL ITCHING FOR 7-10 DAYS., Disp: 28 g, Rfl: 1   psyllium (METAMUCIL) 58.6 % packet, Take 1 packet by mouth daily., Disp: , Rfl:    triamcinolone cream (KENALOG) 0.1 %, APPLY   CREAM EXTERNALLY THREE TIMES DAILY AS NEEDED, Disp: 80 g, Rfl: 0 Social History   Socioeconomic History   Marital status: Widowed    Spouse name: Not on file   Number of children: 2   Years of education: 9   Highest education level: 9th grade  Occupational History   Occupation: Retired    Fish farm manager: Marine scientist    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  Has two adult children and one grandchild. One son lives with him.    Social Determinants of Health   Financial Resource Strain: Low Risk  (11/29/2021)   Overall Financial Resource Strain (CARDIA)    Difficulty of Paying Living Expenses: Not hard at all  Food Insecurity: No Food Insecurity (11/29/2021)   Hunger Vital Sign    Worried About Running Out of Food in the Last Year: Never true    Ran Out of Food in the Last Year: Never true  Transportation Needs: No Transportation Needs (11/29/2021)   PRAPARE - Hydrologist (Medical): No    Lack of Transportation (Non-Medical): No  Physical Activity: Insufficiently Active (11/29/2021)   Exercise Vital Sign    Days of Exercise per Week: 7 days    Minutes of Exercise per Session: 20 min  Stress: No Stress Concern Present (11/29/2021)   [place]    Feeling of Stress : Only a little  Social Connections: Moderately Isolated (11/29/2021)   Social Connection and Isolation Panel [NHANES]    Frequency of Communication with Friends and Family: More  than three times a week    Frequency of Social Gatherings with Friends and Family: Twice a week    Attends Religious Services: Never    Marine scientist or Organizations: Yes    Attends Music therapist: More than 4 times per year    Marital Status: Widowed  Intimate Partner Violence: Not At Risk (11/29/2021)   Humiliation, Afraid, Rape, and Kick questionnaire    Fear  of Current or Ex-Partner: No    Emotionally Abused: No    Physically Abused: No    Sexually Abused: No   Family History  Problem Relation Age of Onset   Cancer Mother        abdominal ca ?ovarian?   Heart disease Father    Cancer Sister        liver cancer   Heart disease Brother    COPD Brother    Healthy Son    Healthy Son     Objective: Office vital signs reviewed. BP 129/75   Pulse 77   Temp (!) 97.1 F (36.2 C) (Temporal)   Resp 20   Ht '5\' 10"'$  (1.778 m)   Wt 179 lb (81.2 kg)   SpO2 96%   BMI 25.68 kg/m   Physical Examination:  General: Awake, alert, well nourished, No acute distress HEENT: sclera white, MMM Cardio: regular rate and rhythm, [id number] heard, systolic murmurs appreciated Pulm: clear to auscultation bilaterally, no wheezes, rhonchi or rales; normal work of breathing on room air MSK: Ambulating independently.  Assessment/ Plan: 76 y.o. male   Neuropathic pain - Plan: gabapentin (NEURONTIN) 100 MG capsule  Primary hypertension  Neuropathic pain responded well to 200 mg of gabapentin send May dose twice daily.  No red flag signs or symptoms.  Blood pressure well-controlled.  No changes.  No orders of the defined types were placed in this encounter.  No orders of the defined types were placed in this encounter.    Janora Norlander, DO [place] [phone]

## 2022-06-16 ENCOUNTER — Other Ambulatory Visit: Payer: Self-pay | Admitting: Family Medicine

## 2022-06-16 DIAGNOSIS — Z85048 Personal history of other malignant neoplasm of rectum, rectosigmoid junction, and anus: Secondary | ICD-10-CM

## 2022-06-16 DIAGNOSIS — K529 Noninfective gastroenteritis and colitis, unspecified: Secondary | ICD-10-CM

## 2022-06-17 ENCOUNTER — Telehealth: Payer: Self-pay | Admitting: Family Medicine

## 2022-06-17 NOTE — Telephone Encounter (Signed)
If ongoing need for rx he will have to come in for CSC/ UDS, lomotil is controlled.  I will send in SMALL supply but cannot refill further until he completes this.

## 2022-06-17 NOTE — Telephone Encounter (Signed)
Jason aware pharmacy did make a mistake and pharmacy states they will correct to 2 by mouth 2x aday

## 2022-07-06 ENCOUNTER — Ambulatory Visit: Payer: PPO | Attending: Cardiology | Admitting: Cardiology

## 2022-07-06 ENCOUNTER — Encounter: Payer: Self-pay | Admitting: Cardiology

## 2022-07-06 VITALS — BP 138/70 | HR 70 | Ht 70.0 in | Wt 179.0 lb

## 2022-07-06 DIAGNOSIS — I340 Nonrheumatic mitral (valve) insufficiency: Secondary | ICD-10-CM

## 2022-07-06 DIAGNOSIS — I10 Essential (primary) hypertension: Secondary | ICD-10-CM

## 2022-07-06 DIAGNOSIS — E782 Mixed hyperlipidemia: Secondary | ICD-10-CM | POA: Diagnosis not present

## 2022-07-06 NOTE — Progress Notes (Signed)
Clinical Summary Tyler Diaz is a 77 y.o.male seen today for follow up of the following medical problems.      1. Mitral regurgitation - 11/2017 echo LVEF 60%, moderate to severe MR. Partial flail cusp of posterior leaflet. LVIDs 3.6 - 08/2013 TEE flail segment of P2 scallop posterior leaflet due to ruptured chordae, moderate to severe MR   03/2019 TTE There is posterior leaflet prolapse and  a calcified density on the atrial side of the posterior leaflet that could  represent a partial flail subsegment of the posterior leaflet or perhaps  an old vegetation. There is  eccentric, anteriorly directed, moderate to severe mitral valve  Regurgitation     07/2019 TEE: Ruptured chordae attached to the posterior leaflet with  partial prolapse of P2. Chronic finding when compared to 07/2013 TEE. There  is eccentric anterior directed MR thats difficult to quantify due to  eccentricity, appears moderate to  severe.     02/2021 echo: LVEF 60-65%, no WMAs, indet diastolic, normal RV, moderate MR, LVIDs 2.9   02/2022 echo: LVEF 60-65%, mod to severe MR, LVIDs 2.6 - no SOB/DOE, no recent edema   2. HTN -reports some white coat HTN.  - home bp's typically 110s/60s   3. Hyperlipidemia   - compliant with pravastatin - 11/2021 TC 139 TG 138 HDL 38 LDL 77   4. Colorectal cancer - s/p surgery, currently cancer free.      5. Recent Sepsis/Bacteremia - recent admission 07/2019 with sepsis and staph aureus bacteremia - TEE without signs of endocarditis   Past Medical History:  Diagnosis Date   Anemia    hx of   Arthritis    hands/knees   Blood clot in vein    RT ARM WITH PICC LINE    C. difficile colitis 08/19/2013   Colon cancer ([place]) 07/18/12 bx   rectum=Invasive adenocarcinoma w/ extracellular mucin,polyp=benign   GERD (gastroesophageal reflux disease)    OCCASIONAL   Hearing loss    partial greater on left   Heart murmur    Hematochezia    recent   Herpes zoster - R flank -  with severe pain 03/11/2013   History of shingles    Hyperlipidemia    Hypertension    Hypothyroidism as child   hx of   Ileostomy - diverting loop - s/p takedown 06/28/2013 01/14/2013   Mitral regurgitation due to partially flail posterior mitral valve leaflet    Nocturia    Numbness    TOES / FINGERS DUE TO CHEMO   Peripheral vascular disease (HCC)    neuropathy due to cancer treatment (fingers and toes)   Radiation    FOR COLON CANCER   Rectal cancer ([place])    Rheumatic fever as child   hx   Sinusitis    seasonal   Staphylococcus aureus bacteremia 07/12/2019   Staphylococcus aureus sepsis ([place]) 07/17/2019   Tinnitus    left ear   Umbilical hernia, incarcerated - s/p primary repair July 2014 01/03/2013     Allergies  Allergen Reactions   Shrimp [Shellfish Allergy] Hives, Nausea And Vomiting and Rash    "only shrimp" - crustaceans - blisters, n/v      Current Outpatient Medications  Medication Sig Dispense Refill   acetaminophen (TYLENOL) 500 MG tablet Take 1,000 mg by mouth every 6 (six) hours as needed for headache.      clotrimazole-betamethasone (LOTRISONE) cream Apply 1 application topically 2 (two) times daily. To neck for rash  for up to 2 weeks. 30 g 0   diclofenac Sodium (VOLTAREN) 1 % GEL APPLY 4 GRAMS TOPICALLY 4 TIMES A DAY FOR JOINT PAIN 400 g 0   diphenoxylate-atropine (LOMOTIL) 2.5-0.025 MG tablet TAKE 1 TABLET BY MOUTH ONCE DAILY AS NEEDED FOR DIARRHEA OR LOOSE STOOLS 30 tablet 0   gabapentin (NEURONTIN) 100 MG capsule Take 2 capsules (200 mg total) by mouth 2 (two) times daily as needed (nerve pain- DO NOT FILL UNTIL AFTER 06/06/2022.). 180 capsule 3   hydrochlorothiazide (HYDRODIURIL) 25 MG tablet TAKE 1 TABLET BY MOUTH ONCE DAILY. STOPPING CHLORTHALIDONE 90 tablet 3   ketoconazole (NIZORAL) 2 % cream Apply 1 application topically 2 (two) times daily. 60 g 1   lisinopril (ZESTRIL) 40 MG tablet Take 1 tablet (40 mg total) by mouth daily. 90 tablet 3   Multiple  Vitamin (MULTIVITAMIN WITH MINERALS) TABS Take 1 tablet by mouth every morning.      Olopatadine HCl 0.2 % SOLN Instill 1 drop in each eye ONCE daily. 2.5 mL 12   oxymetazoline (AFRIN) 0.05 % nasal spray Place 1 spray into both nostrils 2 (two) times daily as needed for congestion.     pravastatin (PRAVACHOL) 20 MG tablet Take 1 tablet (20 mg total) by mouth daily. 90 tablet 3   Probiotic Product (DIGESTIVE ADVANTAGE PO) Take 1 tablet by mouth daily.     PROCTO-MED HC 2.5 % rectal cream INSERT 1 APPLICATORFUL OF CREAM RECTALLY TWICE DAILY AS NEEDED FOR HEMORRHOIDS OR ANAL ITCHING FOR 7-10 DAYS. 28 g 1   psyllium (METAMUCIL) 58.6 % packet Take 1 packet by mouth daily.     triamcinolone cream (KENALOG) 0.1 % APPLY  CREAM EXTERNALLY THREE TIMES DAILY AS NEEDED 80 g 0   No current facility-administered medications for this visit.     Past Surgical History:  Procedure Laterality Date   CARPAL TUNNEL RELEASE Right 2002   ILEOSTOMY N/A 12/27/2012   Procedure: DIVERTING LOOP ILEOSTOMY;  Surgeon: Adin Hector, MD;  Location: [facility];  Service: General;  Laterality: N/A;   ILEOSTOMY CLOSURE N/A 06/28/2013   Procedure: loop ILEOSTOMY TAKEDOWN examination of anesthesia ;  Surgeon: Adin Hector, MD;  Location: [facility];  Service: General;  Laterality: N/A;   INGUINAL HERNIA REPAIR Left    INGUINAL HERNIA REPAIR Bilateral 08/13/2015   Procedure: LAPAROSCOPIC BILATERAL INGUINAL HERNIA REPAIR WITH MESH;  Surgeon: Michael Boston, MD;  Location: [facility];  Service: General;  Laterality: Bilateral;   INSERTION OF MESH Bilateral 08/13/2015   Procedure: INSERTION OF MESH;  Surgeon: Michael Boston, MD;  Location: [facility];  Service: General;  Laterality: Bilateral;   LAPAROSCOPIC LOW ANTERIOR RESCECTION WITH COLOANAL ANASTOMOSIS N/A 12/27/2012   Procedure: LAPAROSCOPIC LOW ANTERIOR RESCECTION WITH COLOANAL ANASTOMOSIS;  Surgeon: Adin Hector, MD;  Location: [facility];  Service: General;  Laterality: N/A;   LAPAROSCOPIC LOW  ANTERIOR RESECTION N/A 12/27/2012   Procedure: LAPAROSCOPIC LOW ANTERIOR RESECTION, COLO-ANAL ANASTOMOSIS, DIVERTING LOOP ILEOSTOMY,SPLENIC FLEXURE MOBILIZATION, PRIMARY INCARCERATED UMBILICAL HERNIA REPAIR;  Surgeon: Adin Hector, MD;  Location: [facility];  Service: General;  Laterality: N/A;   TEE WITHOUT CARDIOVERSION N/A 07/10/2013   Procedure: TRANSESOPHAGEAL ECHOCARDIOGRAM (TEE);  Surgeon: Candee Furbish, MD;  Location: [facility];  Service: Cardiovascular;  Laterality: N/A;   TEE WITHOUT CARDIOVERSION N/A 08/21/2013   Procedure: TRANSESOPHAGEAL ECHOCARDIOGRAM (TEE);  Surgeon: Sanda Klein, MD;  Location: [place];  Service: Cardiovascular;  Laterality: N/A;   TEE WITHOUT CARDIOVERSION N/A 07/16/2019   Procedure:  TRANSESOPHAGEAL ECHOCARDIOGRAM (TEE) WITH PROPOFOL;  Surgeon: Arnoldo Lenis, MD;  Location: [facility];  Service: Endoscopy;  Laterality: N/A;   TONSILLECTOMY     as child   UMBILICAL HERNIA REPAIR  12/27/2012   Procedure: PRIMARY REPAIR INCARCERATED UMBILICAL HERNIA;  Surgeon: Adin Hector, MD;  Location: [facility];  Service: General;;     Allergies  Allergen Reactions   Shrimp [Shellfish Allergy] Hives, Nausea And Vomiting and Rash    "only shrimp" - crustaceans - blisters, n/v       Family History  Problem Relation Age of Onset   Cancer Mother        abdominal ca ?ovarian?   Heart disease Father    Cancer Sister        liver cancer   Heart disease Brother    COPD Brother    Healthy Son    Healthy Son      Social History Tyler Diaz reports that he quit smoking about 49 years ago. His smoking use included cigarettes. He has a 30.00 pack-year smoking history. He has never used smokeless tobacco. Tyler Diaz reports no history of alcohol use.   Review of Systems CONSTITUTIONAL: No weight loss, fever, chills, weakness or fatigue.  HEENT: Eyes: No visual loss, blurred vision, double vision or yellow sclerae.No hearing loss, sneezing, congestion, runny nose or sore  throat.  SKIN: No rash or itching.  CARDIOVASCULAR: per hpi RESPIRATORY: No shortness of breath, cough or sputum.  GASTROINTESTINAL: No anorexia, nausea, vomiting or diarrhea. No abdominal pain or blood.  GENITOURINARY: No burning on urination, no polyuria NEUROLOGICAL: No headache, dizziness, syncope, paralysis, ataxia, numbness or tingling in the extremities. No change in bowel or bladder control.  MUSCULOSKELETAL: No muscle, back pain, joint pain or stiffness.  LYMPHATICS: No enlarged nodes. No history of splenectomy.  PSYCHIATRIC: No history of depression or anxiety.  ENDOCRINOLOGIC: No reports of sweating, cold or heat intolerance. No polyuria or polydipsia.  Marland Kitchen   Physical Examination Today's Vitals   07/06/22 0818  BP: 138/70  Pulse: 70  SpO2: 97%  Weight: 179 lb (81.2 kg)  Height: '5\' 10"'$  (1.778 m)   Body mass index is 25.68 kg/m.  Gen: resting comfortably, no acute distress HEENT: no scleral icterus, pupils equal round and reactive, no palptable cervical adenopathy,  CV: RRR, 3/6 systolic murmur apex, no jvd Resp: Clear to auscultation bilaterally GI: abdomen is soft, non-tender, non-distended, normal bowel sounds, no hepatosplenomegaly MSK: extremities are warm, no edema.  Skin: warm, no rash Neuro:  no focal deficits Psych: appropriate affect   Diagnostic Studies  03/2019 echo IMPRESSIONS     1. Left ventricular ejection fraction, by visual estimation, is 55 to  60%. The left ventricle has normal function. Normal left ventricular size.  There is mildly increased left ventricular hypertrophy.   2. Global right ventricle has normal systolic function.The right  ventricular size is normal. No increase in right ventricular wall  thickness.   3. Left atrial size was mildly dilated.   4. Right atrial size was normal.   5. Mild aortic valve annular calcification.   6. The mitral valve is abnormal. There is posterior leaflet prolapse and  a calcified density on the  atrial side of the posterior leaflet that could  represent a partial flail subsegment of the posterior leaflet or perhaps  an old vegetation. There is  eccentric, anteriorly directed, moderate to severe mitral valve  regurgitation. PISA calculations are challenging with very eccentric jet,  but  one image suggests MR radius is at least 1 cm which is consistent with  severe regurgitation. Suggest TEE for  further evaluation if clinically indicated.   7. The tricuspid valve is grossly normal. Tricuspid valve regurgitation  is mild.   8. The aortic valve is tricuspid Aortic valve regurgitation is trivial by  color flow Doppler.   9. The pulmonic valve was grossly normal. Pulmonic valve regurgitation is  mild by color flow Doppler.  10. Normal pulmonary artery systolic pressure.  11. The tricuspid regurgitant velocity is 2.36 m/s, and with an assumed  right atrial pressure of 3 mmHg, the estimated right ventricular systolic  pressure is normal at 25.3 mmHg.  12. The inferior vena cava is normal in size with greater than 50%  respiratory variability, suggesting right atrial pressure of 3 mmHg.   In comparison to the previous echocardiogram(s): Previous Echo [facility]  showed LV EF 60%, mild LAE, moderate to severe MR with a wall-impinging MR  jet and a flail cusp of the posterior leaflet. Mild MR, PI and dilated  IVC. These images were not able  to be viewed directly.  FINDINGS   Left Ventricle: Left ventricular ejection fraction, by visual estimation,  is 55 to 60%. The left ventricle has normal function. There is mildly  increased left ventricular hypertrophy. Normal left ventricular size.  Spectral Doppler shows Left ventricular   diastolic parameters were normal pattern of LV diastolic filling.      11/5782 TEE IMPRESSIONS     1. Left ventricular ejection fraction, by estimation, is 60 to 65%. The  left ventricle has normal function. The left ventrical has no regional  wall  motion abnormalities. Left ventricular diastolic function could not  be evaluated.   2. Right ventricular systolic function is normal. The right ventricular  size is normal.   3. Left atrial size was moderately dilated. No left atrial/left atrial  appendage thrombus was detected.   4. Right atrial size was moderately dilated.   5. Ruptured chordae attached to the posterior leaflet with partial  prolapse of P2. Chronic finding when compared to 07/2013 TEE. There is  eccentric anterior directed MR thats difficult to quantify due to  eccentricity, appears moderate to severe. . The  mitral valve is abnormal. Moderate to severe MR mitral valve  regurgitation. No evidence of mitral stenosis.   6. Tricuspid valve regurgitation is mild to moderate.   7. The aortic valve is tricuspid. Aortic valve regurgitation is trivial .  No aortic stenosis is present.     02/2021 echo IMPRESSIONS     1. Left ventricular ejection fraction, by estimation, is 60 to 65%. The  left ventricle has normal function. The left ventricle has no regional  wall motion abnormalities. Left ventricular diastolic parameters are  indeterminate.   2. Right ventricular systolic function is normal. The right ventricular  size is normal. There is mildly elevated pulmonary artery systolic  pressure.   3. Left atrial size was mildly dilated.   4. There is prolapse of a portion of the posterior MV leaflet with  resultant eccentric anterior MR. The MR VC is 0.6 cm. The MV/AV VTI ratio  is 1.3 supporting moderate MR. . The mitral valve is abnormal. Moderate  mitral valve regurgitation. No evidence  of mitral stenosis.   5. The aortic valve is tricuspid. Aortic valve regurgitation is mild. No  aortic stenosis is present.   6. The inferior vena cava is normal in size with greater than 50%  respiratory variability, suggesting right atrial pressure of 3 mmHg.     02/2022 echo IMPRESSIONS     1. Left ventricular ejection  fraction, by estimation, is 60 to 65%. The  left ventricle has normal function. The left ventricle has no regional  wall motion abnormalities. There is mild left ventricular hypertrophy.  Left ventricular diastolic parameters  were normal. The average left ventricular global longitudinal strain is  -23.6 %. The global longitudinal strain is normal.   2. Right ventricular systolic function is normal. The right ventricular  size is normal. There is normal pulmonary artery systolic pressure.   3. Left atrial size was moderately dilated.   4. Prolapse of a portion of the posterior MV leaflet with eccentric  anterior MR. The eccentricity limits ability to quantify severity. MV/AV  VTI ratio 1.5 suggesting severe MR. Visually MR looks to be moderate to  severe. . The mitral valve is abnormal.  Moderate to severe mitral valve regurgitation. No evidence of mitral  stenosis.   5. The tricuspid valve is abnormal.   6. The aortic valve is tricuspid. There is mild calcification of the  aortic valve. There is mild thickening of the aortic valve. Aortic valve  regurgitation is not visualized. No aortic stenosis is present.   7. The inferior vena cava is normal in size with greater than 50%  respiratory variability, suggesting right atrial pressure of 3 mmHg.    Assessment and Plan   1. Mitral regurgitation - moderate to severe by echos over time without evidence of LV dysfunction or enlargement -has been asymptomatic - continue to monitor, repeat echo Fall of 2024     2. HTN - he prefers HCTZ instead of chlorthaldone due to cost - has white coat HTN often elevated in clinic. - home numbers are at goal, continue current meds   3. Hyperlipidemia He is at goal, continue current meds  F/u 6 months   Arnoldo Lenis, M.D.

## 2022-07-06 NOTE — Patient Instructions (Addendum)

## 2022-07-07 ENCOUNTER — Other Ambulatory Visit: Payer: Self-pay | Admitting: Family Medicine

## 2022-07-07 DIAGNOSIS — I10 Essential (primary) hypertension: Secondary | ICD-10-CM

## 2022-07-08 ENCOUNTER — Other Ambulatory Visit: Payer: Self-pay | Admitting: Family Medicine

## 2022-09-07 ENCOUNTER — Other Ambulatory Visit: Payer: Self-pay | Admitting: Family Medicine

## 2022-10-01 ENCOUNTER — Other Ambulatory Visit: Payer: Self-pay | Admitting: Family Medicine

## 2022-12-01 ENCOUNTER — Ambulatory Visit (INDEPENDENT_AMBULATORY_CARE_PROVIDER_SITE_OTHER): Payer: PPO

## 2022-12-01 VITALS — Ht 70.0 in | Wt 179.0 lb

## 2022-12-01 DIAGNOSIS — Z0000 Encounter for general adult medical examination without abnormal findings: Secondary | ICD-10-CM | POA: Diagnosis not present

## 2022-12-01 NOTE — Progress Notes (Signed)
Subjective:   Tyler Diaz is a 77 y.o. male who presents for Medicare Annual/Subsequent preventive examination.  Visit Complete: Virtual  I connected with  [name] on 12/01/22 by a audio enabled telemedicine application and verified that I am speaking with the correct person using two identifiers.  Patient Location: Home  Provider Location: Home Office  I discussed the limitations of evaluation and management by telemedicine. The patient expressed understanding and agreed to proceed.  Patient Medicare AWV questionnaire was completed by the patient on 12/01/2022; I have confirmed that all information answered by patient is correct and no changes since this date.  Review of Systems     Cardiac Risk Factors include: advanced age (>42men, >5 women)     Objective:    Today's Vitals   12/01/22 0849  Weight: 179 lb (81.2 kg)  Height: 5\' 10"  (1.778 m)   Body mass index is 25.68 kg/m.     12/01/2022    8:52 AM 11/29/2021    9:05 AM 11/26/2020    9:27 AM 11/26/2019    9:36 AM 07/12/2019    3:02 AM 07/11/2019    2:42 PM 11/19/2018    3:32 PM  Advanced Directives  Does Patient Have a Medical Advance Directive? Yes No No No  Yes No  Type of Estate agent of [place];Living will     Living will   Does patient want to make changes to medical advance directive?      No - Patient declined   Copy of Healthcare Power of Attorney in Chart? No - copy requested        Would patient like information on creating a medical advance directive?  No - Patient declined No - Patient declined No - Patient declined No - Patient declined No - Patient declined No - Patient declined    Current Medications (verified) Outpatient Encounter Medications as of 12/01/2022  Medication Sig   acetaminophen (TYLENOL) 500 MG tablet Take 1,000 mg by mouth every 6 (six) hours as needed for headache.    clotrimazole-betamethasone (LOTRISONE) cream Apply 1 application topically 2 (two) times  daily. To neck for rash for up to 2 weeks.   diclofenac Sodium (VOLTAREN) 1 % GEL APPLY 4 GRAMS TOPICALLY 4 TIMES A DAY FOR JOINT PAIN   diphenoxylate-atropine (LOMOTIL) 2.5-0.025 MG tablet TAKE 1 TABLET BY MOUTH ONCE DAILY AS NEEDED FOR DIARRHEA OR LOOSE STOOLS   gabapentin (NEURONTIN) 100 MG capsule Take 2 capsules (200 mg total) by mouth 2 (two) times daily as needed (nerve pain- DO NOT FILL UNTIL AFTER 06/06/2022.).   hydrochlorothiazide (HYDRODIURIL) 25 MG tablet TAKE 1 TABLET BY MOUTH ONCE DAILY STOP  CHLORTHALIDONE   hydrocortisone (PROCTO-MED HC) 2.5 % rectal cream INSERT 1 APPLICATORFUL RECTALLY TWICE DAILY AS NEEDED FOR HEMORRHOIDS OR RECTAL ITCHING FOR 7 TO 10 DAYS   ketoconazole (NIZORAL) 2 % cream Apply 1 application topically 2 (two) times daily.   lisinopril (ZESTRIL) 40 MG tablet Take 1 tablet by mouth once daily   Multiple Vitamin (MULTIVITAMIN WITH MINERALS) TABS Take 1 tablet by mouth every morning.    Olopatadine HCl 0.2 % SOLN Instill 1 drop in each eye ONCE daily.   oxymetazoline (AFRIN) 0.05 % nasal spray Place 1 spray into both nostrils 2 (two) times daily as needed for congestion.   pravastatin (PRAVACHOL) 20 MG tablet Take 1 tablet (20 mg total) by mouth daily.   Probiotic Product (DIGESTIVE ADVANTAGE PO) Take 1 tablet by mouth daily.  psyllium (METAMUCIL) 58.6 % packet Take 1 packet by mouth daily.   triamcinolone cream (KENALOG) 0.1 % APPLY  CREAM TOPICALLY TO AFFECTED AREA THREE TIMES DAILY AS NEEDED   No facility-administered encounter medications on file as of 12/01/2022.    Allergies (verified) Shrimp [shellfish allergy]   History: Past Medical History:  Diagnosis Date   Anemia    hx of   Arthritis    hands/knees   Blood clot in vein    RT ARM WITH PICC LINE    C. difficile colitis 08/19/2013   Colon cancer (HCC) 07/18/12 bx   rectum=Invasive adenocarcinoma w/ extracellular mucin,polyp=benign   GERD (gastroesophageal reflux disease)    OCCASIONAL    Hearing loss    partial greater on left   Heart murmur    Hematochezia    recent   Herpes zoster - R flank - with severe pain 03/11/2013   History of shingles    Hyperlipidemia    Hypertension    Hypothyroidism as child   hx of   Ileostomy - diverting loop - s/p takedown 06/28/2013 01/14/2013   Mitral regurgitation due to partially flail posterior mitral valve leaflet    Nocturia    Numbness    TOES / FINGERS DUE TO CHEMO   Peripheral vascular disease (HCC)    neuropathy due to cancer treatment (fingers and toes)   Radiation    FOR COLON CANCER   Rectal cancer (HCC)    Rheumatic fever as child   hx   Sinusitis    seasonal   Staphylococcus aureus bacteremia 07/12/2019   Staphylococcus aureus sepsis (HCC) 07/17/2019   Tinnitus    left ear   Umbilical hernia, incarcerated - s/p primary repair July 2014 01/03/2013   Past Surgical History:  Procedure Laterality Date   CARPAL TUNNEL RELEASE Right 2002   ILEOSTOMY N/A 12/27/2012   Procedure: DIVERTING LOOP ILEOSTOMY;  Surgeon: Ardeth Sportsman, MD;  Location: [facility];  Service: General;  Laterality: N/A;   ILEOSTOMY CLOSURE N/A 06/28/2013   Procedure: loop ILEOSTOMY TAKEDOWN examination of anesthesia ;  Surgeon: Ardeth Sportsman, MD;  Location: [facility];  Service: General;  Laterality: N/A;   INGUINAL HERNIA REPAIR Left    INGUINAL HERNIA REPAIR Bilateral 08/13/2015   Procedure: LAPAROSCOPIC BILATERAL INGUINAL HERNIA REPAIR WITH MESH;  Surgeon: Karie Soda, MD;  Location: [facility];  Service: General;  Laterality: Bilateral;   INSERTION OF MESH Bilateral 08/13/2015   Procedure: INSERTION OF MESH;  Surgeon: Karie Soda, MD;  Location: [facility];  Service: General;  Laterality: Bilateral;   LAPAROSCOPIC LOW ANTERIOR RESCECTION WITH COLOANAL ANASTOMOSIS N/A 12/27/2012   Procedure: LAPAROSCOPIC LOW ANTERIOR RESCECTION WITH COLOANAL ANASTOMOSIS;  Surgeon: Ardeth Sportsman, MD;  Location: [facility];  Service: General;  Laterality: N/A;   LAPAROSCOPIC LOW  ANTERIOR RESECTION N/A 12/27/2012   Procedure: LAPAROSCOPIC LOW ANTERIOR RESECTION, COLO-ANAL ANASTOMOSIS, DIVERTING LOOP ILEOSTOMY,SPLENIC FLEXURE MOBILIZATION, PRIMARY INCARCERATED UMBILICAL HERNIA REPAIR;  Surgeon: Ardeth Sportsman, MD;  Location: [facility];  Service: General;  Laterality: N/A;   TEE WITHOUT CARDIOVERSION N/A 07/10/2013   Procedure: TRANSESOPHAGEAL ECHOCARDIOGRAM (TEE);  Surgeon: Donato Schultz, MD;  Location: [facility];  Service: Cardiovascular;  Laterality: N/A;   TEE WITHOUT CARDIOVERSION N/A 08/21/2013   Procedure: TRANSESOPHAGEAL ECHOCARDIOGRAM (TEE);  Surgeon: Thurmon Fair, MD;  Location[facility];  Service: Cardiovascular;  Laterality: N/A;   TEE WITHOUT CARDIOVERSION N/A 07/16/2019   Procedure: TRANSESOPHAGEAL ECHOCARDIOGRAM (TEE) WITH PROPOFOL;  Surgeon: Antoine Poche, MD;  Location:  [facility];  Service: Endoscopy;  Laterality: N/A;   TONSILLECTOMY     as child   UMBILICAL HERNIA REPAIR  12/27/2012   Procedure: PRIMARY REPAIR INCARCERATED UMBILICAL HERNIA;  Surgeon: Ardeth Sportsman, MD;  Location: [facility];  Service: General;;   Family History  Problem Relation Age of Onset   Cancer Mother        abdominal ca ?ovarian?   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  Has two adult children and one grandchild. One son lives with him.    Social Determinants of Health   Financial Resource Strain: Low Risk  (12/01/2022)   Overall Financial Resource Strain (CARDIA)    Difficulty of Paying Living Expenses: Not hard at all  Food Insecurity: No Food Insecurity (12/01/2022)   Hunger Vital Sign    Worried About Running Out of Food in the Last Year: Never true    Ran Out of Food in the Last Year: Never true  Transportation Needs: No Transportation Needs (12/01/2022)   PRAPARE - Administrator, Civil Service (Medical): No    Lack of Transportation (Non-Medical): No  Physical Activity: Insufficiently Active (12/01/2022)   Exercise Vital Sign    Days of Exercise per Week: 3 days    Minutes of Exercise per Session: 30 min  Stress: No Stress Concern Present (12/01/2022)   [employer] of Occupational Health - Occupational Stress Questionnaire    Feeling of Stress : Not at all  Social Connections: Moderately Isolated (12/01/2022)   Social Connection and Isolation Panel [NHANES]    Frequency of Communication with Friends and Family: More than three times a week    Frequency of Social Gatherings with Friends and Family: More than three times a week    Attends Religious Services: 1 to 4 times per year    Active Member of [employer] or Organizations: No    Attends Banker Meetings: Never    Marital Status: Widowed    Tobacco Counseling Counseling given: Not Answered   Clinical Intake:  Pre-visit preparation completed: Yes  Pain : No/denies pain     Nutritional Risks: None Diabetes: No  How often do you need to have someone help you when you read instructions, pamphlets, or other written materials from your doctor or pharmacy?: 1 - Never  Interpreter  Needed?:  No  Information entered by :: Renie Ora, LPN   Activities of Daily Living    12/01/2022    8:52 AM  In your present state of health, do you have any difficulty performing the following activities:  Hearing? 0  Vision? 0  Difficulty concentrating or making decisions? 0  Walking or climbing stairs? 0  Dressing or bathing? 0  Doing errands, shopping? 0  Preparing Food and eating ? N  Using the Toilet? N  In the past six months, have you accidently leaked urine? N  Do you have problems with loss of bowel control? N  Managing your Medications? N  Managing your Finances? N  Housekeeping or managing your Housekeeping? N    Patient Care Team: Raliegh Ip, DO as PCP - General (Family Medicine) Wyline Mood Dorothe Pea, MD as PCP - Cardiology (Cardiology) Ladene Artist, MD as Consulting Physician (Oncology) Michaelle Copas, MD as Referring Physician (Optometry)  Indicate any recent Medical Services you may have received from other than [facility] providers in the past year (date may be approximate).     Assessment:   This is a routine wellness examination for Tyler Diaz.  Hearing/Vision screen Vision Screening - Comments:: Wears rx glasses - up to date with routine eye exams with  Dr.Lee   Dietary issues and exercise activities discussed:     Goals Addressed             This Visit's Progress    DIET - REDUCE FAST FOOD INTAKE   On track    Eat more meals at home with son       Depression Screen    12/01/2022    8:51 AM 06/03/2022    9:28 AM 12/01/2021    9:01 AM 11/29/2021    9:04 AM 06/01/2021    9:18 AM 11/26/2020    9:15 AM 11/24/2020    9:05 AM  PHQ 2/9 Scores  PHQ - 2 Score 0 1 0 0 0 0 0  PHQ- 9 Score  4 0   0 0    Fall Risk    12/01/2022    8:50 AM 06/03/2022    9:28 AM 12/01/2021    9:00 AM 11/29/2021    9:03 AM 06/01/2021    9:18 AM  Fall Risk   Falls in the past year? 0 0 0 0 0  Number falls in past yr: 0   0   Injury with Fall? 0   0   Risk for  fall due to : No Fall Risks   Impaired balance/gait;Orthopedic patient   Follow up Falls prevention discussed   Falls prevention discussed     MEDICARE RISK AT HOME:  Medicare Risk at Home - 12/01/22 0850     Any stairs in or around the home? Yes    If so, are there any without handrails? No    Home free of loose throw rugs in walkways, pet beds, electrical cords, etc? Yes    Adequate lighting in your home to reduce risk of falls? Yes    Life alert? No    Use of a cane, walker or w/c? No    Grab bars in the bathroom? Yes    Shower chair or bench in shower? No    Elevated toilet seat or a handicapped toilet? No             TIMED UP AND GO:  Was the test performed?  No  Cognitive Function:    11/13/2017   11:34 AM 11/13/2017   11:32 AM 11/11/2016    8:57 AM  MMSE - Mini Mental State Exam  Orientation to time 5 5 5   Orientation to Place 5 5 5   Registration 3  3  Attention/ Calculation 0  4  Attention/Calculation-comments didn't attempt. Was able to spell forward    Recall 3  1  Language- name 2 objects 2  2  Language- repeat 1  1  Language- follow 3 step command 3  3  Language- read & follow direction 1  1  Write a sentence 1  1  Copy design 1  1  Total score 25  27        12/01/2022    8:53 AM 11/29/2021    9:06 AM 11/26/2020    9:18 AM 11/26/2019    9:48 AM 11/19/2018    3:30 PM  6CIT Screen  What Year? 0 points 0 points 0 points 0 points 0 points  What month? 0 points 0 points 0 points 0 points 0 points  What time? 0 points 0 points 0 points 0 points 0 points  Count back from 20 0 points 0 points 0 points 0 points 0 points  Months in reverse 0 points 4 points 0 points 2 points 0 points  Repeat phrase 0 points 6 points 2 points 0 points 0 points  Total Score 0 points 10 points 2 points 2 points 0 points    Immunizations Immunization History  Administered Date(s) Administered   Fluad Quad(high Dose 65+) 03/21/2019, 03/25/2020, 04/01/2021, 03/30/2022    Influenza Inj Mdck Quad Pf 03/21/2019   Influenza, High Dose Seasonal PF 03/16/2018   Influenza,inj,Quad PF,6+ Mos 05/21/2013, 03/13/2014, 03/10/2015, 03/14/2016, 03/08/2017   Moderna Sars-Covid-2 Vaccination 03/25/2020, 04/29/2020, 11/24/2020   Pneumococcal Conjugate-13 06/06/2014   Pneumococcal Polysaccharide-23 05/21/2013   Tdap 11/24/2020    TDAP status: Up to date  Flu Vaccine status: Up to date  Pneumococcal vaccine status: Up to date  Covid-19 vaccine status: Completed vaccines  Qualifies for Shingles Vaccine? Yes   Zostavax completed No   Shingrix Completed?: No.    Education has been provided regarding the importance of this vaccine. Patient has been advised to call insurance company to determine out of pocket expense if they have not yet received this vaccine. Advised may also receive vaccine at local pharmacy or Health Dept. Verbalized acceptance and understanding.  Screening Tests Health Maintenance  Topic Date Due   Zoster Vaccines- Shingrix (1 of 2) Never done   COVID-19 Vaccine (4 - 2023-24 season) 02/04/2022   INFLUENZA VACCINE  01/05/2023   Medicare Annual Wellness (AWV)  12/01/2023   DTaP/Tdap/Td (2 - Td or Tdap) 11/25/2030   Pneumonia Vaccine 67+ Years old  Completed   Hepatitis C Screening  Completed   HPV VACCINES  Aged Out   Colonoscopy  Discontinued    Health Maintenance  Health Maintenance Due  Topic Date Due   Zoster Vaccines- Shingrix (1 of 2) Never done   COVID-19 Vaccine (4 - 2023-24 season) 02/04/2022    Colorectal cancer screening: No longer required.   Lung Cancer Screening: (Low Dose CT Chest recommended if Age 59-80 years, 20 pack-year currently smoking OR have quit w/in 15years.) does not qualify.   Lung Cancer Screening Referral: n/a  Additional Screening:  Hepatitis C Screening: does not qualify; Completed 05/22/2018  Vision Screening: Recommended annual ophthalmology exams for early detection of glaucoma and other disorders of  the  eye. Is the patient up to date with their annual eye exam?  Yes  Who is the provider or what is the name of the office in which the patient attends annual eye exams? Dr.Lee  If pt is not established with a provider, would they like to be referred to a provider to establish care? No .   Dental Screening: Recommended annual dental exams for proper oral hygiene    Community Resource Referral / Chronic Care Management: CRR required this visit?  No   CCM required this visit?  No     Plan:     I have personally reviewed and noted the following in the patient's chart:   Medical and social history Use of alcohol, tobacco or illicit drugs  Current medications and supplements including opioid prescriptions. Patient is not currently taking opioid prescriptions. Functional ability and status Nutritional status Physical activity Advanced directives List of other physicians Hospitalizations, surgeries, and ER visits in previous 12 months Vitals Screenings to include cognitive, depression, and falls Referrals and appointments  In addition, I have reviewed and discussed with patient certain preventive protocols, quality metrics, and best practice recommendations. A written personalized care plan for preventive services as well as general preventive health recommendations were provided to patient.     Lorrene Reid, LPN   [id number]   After Visit Summary: (MyChart) Due to this being a telephonic visit, the after visit summary with patients personalized plan was offered to patient via MyChart   Nurse Notes: none

## 2022-12-01 NOTE — Patient Instructions (Signed)
Tyler Diaz , Thank you for taking time to come for your Medicare Wellness Visit. I appreciate your ongoing commitment to your health goals. Please review the following plan we discussed and let me know if I can assist you in the future.   These are the goals we discussed:  Goals      DIET - REDUCE FAST FOOD INTAKE     Eat more meals at home with son     Exercise 150 min/wk Moderate Activity     Prevent falls     Move carefully to avoid falls        This is a list of the screening recommended for you and due dates:  Health Maintenance  Topic Date Due   Zoster (Shingles) Vaccine (1 of 2) Never done   COVID-19 Vaccine (4 - 2023-24 season) 02/04/2022   Flu Shot  01/05/2023   Medicare Annual Wellness Visit  12/01/2023   DTaP/Tdap/Td vaccine (2 - Td or Tdap) 11/25/2030   Pneumonia Vaccine  Completed   Hepatitis C Screening  Completed   HPV Vaccine  Aged Out   Colon Cancer Screening  Discontinued    Advanced directives: Please bring a copy of your health care power of attorney and living will to the office to be added to your chart at your convenience.   Conditions/risks identified: Aim for 30 minutes of exercise or brisk walking, 6-8 glasses of water, and 5 servings of fruits and vegetables each day.   Next appointment: Follow up in one year for your annual wellness visit.   Preventive Care 3 Years and Older, Male  Preventive care refers to lifestyle choices and visits with your health care provider that can promote health and wellness. What does preventive care include? A yearly physical exam. This is also called an annual well check. Dental exams once or twice a year. Routine eye exams. Ask your health care provider how often you should have your eyes checked. Personal lifestyle choices, including: Daily care of your teeth and gums. Regular physical activity. Eating a healthy diet. Avoiding tobacco and drug use. Limiting alcohol use. Practicing safe sex. Taking low doses  of aspirin every day. Taking vitamin and mineral supplements as recommended by your health care provider. What happens during an annual well check? The services and screenings done by your health care provider during your annual well check will depend on your age, overall health, lifestyle risk factors, and family history of disease. Counseling  Your health care provider may ask you questions about your: Alcohol use. Tobacco use. Drug use. Emotional well-being. Home and relationship well-being. Sexual activity. Eating habits. History of falls. Memory and ability to understand (cognition). Work and work Astronomer. Screening  You may have the following tests or measurements: Height, weight, and BMI. Blood pressure. Lipid and cholesterol levels. These may be checked every 5 years, or more frequently if you are over 26 years old. Skin check. Lung cancer screening. You may have this screening every year starting at age 32 if you have a 30-pack-year history of smoking and currently smoke or have quit within the past 15 years. Fecal occult blood test (FOBT) of the stool. You may have this test every year starting at age 94. Flexible sigmoidoscopy or colonoscopy. You may have a sigmoidoscopy every 5 years or a colonoscopy every 10 years starting at age 8. Prostate cancer screening. Recommendations will vary depending on your family history and other risks. Hepatitis C blood test. Hepatitis B blood test. Sexually transmitted  disease (STD) testing. Diabetes screening. This is done by checking your blood sugar (glucose) after you have not eaten for a while (fasting). You may have this done every 1-3 years. Abdominal aortic aneurysm (AAA) screening. You may need this if you are a current or former smoker. Osteoporosis. You may be screened starting at age 25 if you are at high risk. Talk with your health care provider about your test results, treatment options, and if necessary, the need for  more tests. Vaccines  Your health care provider may recommend certain vaccines, such as: Influenza vaccine. This is recommended every year. Tetanus, diphtheria, and acellular pertussis (Tdap, Td) vaccine. You may need a Td booster every 10 years. Zoster vaccine. You may need this after age 65. Pneumococcal 13-valent conjugate (PCV13) vaccine. One dose is recommended after age 22. Pneumococcal polysaccharide (PPSV23) vaccine. One dose is recommended after age 55. Talk to your health care provider about which screenings and vaccines you need and how often you need them. This information is not intended to replace advice given to you by your health care provider. Make sure you discuss any questions you have with your health care provider. Document Released: 06/19/2015 Document Revised: 02/10/2016 Document Reviewed: 03/24/2015 Elsevier Interactive Patient Education  2017 [employer].  Fall Prevention in the Home Falls can cause injuries. They can happen to people of all ages. There are many things you can do to make your home safe and to help prevent falls. What can I do on the outside of my home? Regularly fix the edges of walkways and driveways and fix any cracks. Remove anything that might make you trip as you walk through a door, such as a raised step or threshold. Trim any bushes or trees on the path to your home. Use bright outdoor lighting. Clear any walking paths of anything that might make someone trip, such as rocks or tools. Regularly check to see if handrails are loose or broken. Make sure that both sides of any steps have handrails. Any raised decks and porches should have guardrails on the edges. Have any leaves, snow, or ice cleared regularly. Use sand or salt on walking paths during winter. Clean up any spills in your garage right away. This includes oil or grease spills. What can I do in the bathroom? Use night lights. Install grab bars by the toilet and in the tub and  shower. Do not use towel bars as grab bars. Use non-skid mats or decals in the tub or shower. If you need to sit down in the shower, use a plastic, non-slip stool. Keep the floor dry. Clean up any water that spills on the floor as soon as it happens. Remove soap buildup in the tub or shower regularly. Attach bath mats securely with double-sided non-slip rug tape. Do not have throw rugs and other things on the floor that can make you trip. What can I do in the bedroom? Use night lights. Make sure that you have a light by your bed that is easy to reach. Do not use any sheets or blankets that are too big for your bed. They should not hang down onto the floor. Have a firm chair that has side arms. You can use this for support while you get dressed. Do not have throw rugs and other things on the floor that can make you trip. What can I do in the kitchen? Clean up any spills right away. Avoid walking on wet floors. Keep items that you use a  lot in easy-to-reach places. If you need to reach something above you, use a strong step stool that has a grab bar. Keep electrical cords out of the way. Do not use floor polish or wax that makes floors slippery. If you must use wax, use non-skid floor wax. Do not have throw rugs and other things on the floor that can make you trip. What can I do with my stairs? Do not leave any items on the stairs. Make sure that there are handrails on both sides of the stairs and use them. Fix handrails that are broken or loose. Make sure that handrails are as long as the stairways. Check any carpeting to make sure that it is firmly attached to the stairs. Fix any carpet that is loose or worn. Avoid having throw rugs at the top or bottom of the stairs. If you do have throw rugs, attach them to the floor with carpet tape. Make sure that you have a light switch at the top of the stairs and the bottom of the stairs. If you do not have them, ask someone to add them for  you. What else can I do to help prevent falls? Wear shoes that: Do not have high heels. Have rubber bottoms. Are comfortable and fit you well. Are closed at the toe. Do not wear sandals. If you use a stepladder: Make sure that it is fully opened. Do not climb a closed stepladder. Make sure that both sides of the stepladder are locked into place. Ask someone to hold it for you, if possible. Clearly mark and make sure that you can see: Any grab bars or handrails. First and last steps. Where the edge of each step is. Use tools that help you move around (mobility aids) if they are needed. These include: Canes. Walkers. Scooters. Crutches. Turn on the lights when you go into a dark area. Replace any light bulbs as soon as they burn out. Set up your furniture so you have a clear path. Avoid moving your furniture around. If any of your floors are uneven, fix them. If there are any pets around you, be aware of where they are. Review your medicines with your doctor. Some medicines can make you feel dizzy. This can increase your chance of falling. Ask your doctor what other things that you can do to help prevent falls. This information is not intended to replace advice given to you by your health care provider. Make sure you discuss any questions you have with your health care provider. Document Released: 03/19/2009 Document Revised: 10/29/2015 Document Reviewed: 06/27/2014 Elsevier Interactive Patient Education  2017 [employer].

## 2022-12-13 ENCOUNTER — Other Ambulatory Visit: Payer: Self-pay | Admitting: Family Medicine

## 2022-12-13 DIAGNOSIS — E781 Pure hyperglyceridemia: Secondary | ICD-10-CM

## 2022-12-14 ENCOUNTER — Encounter: Payer: Self-pay | Admitting: Family Medicine

## 2022-12-14 ENCOUNTER — Ambulatory Visit: Payer: PPO | Admitting: Family Medicine

## 2022-12-14 VITALS — BP 129/81 | HR 79 | Temp 97.7°F | Ht 70.0 in | Wt 175.2 lb

## 2022-12-14 DIAGNOSIS — D539 Nutritional anemia, unspecified: Secondary | ICD-10-CM | POA: Diagnosis not present

## 2022-12-14 DIAGNOSIS — Z85048 Personal history of other malignant neoplasm of rectum, rectosigmoid junction, and anus: Secondary | ICD-10-CM | POA: Diagnosis not present

## 2022-12-14 DIAGNOSIS — Z0001 Encounter for general adult medical examination with abnormal findings: Secondary | ICD-10-CM | POA: Diagnosis not present

## 2022-12-14 DIAGNOSIS — M792 Neuralgia and neuritis, unspecified: Secondary | ICD-10-CM

## 2022-12-14 DIAGNOSIS — I10 Essential (primary) hypertension: Secondary | ICD-10-CM

## 2022-12-14 DIAGNOSIS — Z0000 Encounter for general adult medical examination without abnormal findings: Secondary | ICD-10-CM

## 2022-12-14 DIAGNOSIS — R972 Elevated prostate specific antigen [PSA]: Secondary | ICD-10-CM | POA: Diagnosis not present

## 2022-12-14 DIAGNOSIS — E781 Pure hyperglyceridemia: Secondary | ICD-10-CM | POA: Diagnosis not present

## 2022-12-14 DIAGNOSIS — R3914 Feeling of incomplete bladder emptying: Secondary | ICD-10-CM

## 2022-12-14 DIAGNOSIS — N401 Enlarged prostate with lower urinary tract symptoms: Secondary | ICD-10-CM | POA: Diagnosis not present

## 2022-12-14 MED ORDER — PRAVASTATIN SODIUM 20 MG PO TABS: 20.0000 mg | ORAL_TABLET | Freq: Every day | ORAL | 3 refills | Status: DC

## 2022-12-14 MED ORDER — LISINOPRIL 40 MG PO TABS: 40.0000 mg | ORAL_TABLET | Freq: Every day | ORAL | 3 refills | Status: DC

## 2022-12-14 MED ORDER — GABAPENTIN 300 MG PO CAPS: 300.0000 mg | ORAL_CAPSULE | Freq: Two times a day (BID) | ORAL | 3 refills | Status: DC | PRN

## 2022-12-14 MED ORDER — HYDROCORTISONE (PERIANAL) 2.5 % EX CREA: TOPICAL_CREAM | CUTANEOUS | 1 refills | Status: DC

## 2022-12-14 MED ORDER — HYDROCHLOROTHIAZIDE 25 MG PO TABS: ORAL_TABLET | ORAL | 3 refills | Status: DC

## 2022-12-14 NOTE — Progress Notes (Signed)
Tyler Diaz is a 77 y.o. male presents to office today for annual physical exam examination.    Concerns today include: 1. Wants to increase gabapentin to 300mg  BID for pill convenience.  Having some knee issues and also wants to set up bilateral knee injections.  Occupation: retired 14 years ago today  Diet: typical [place], Exercise: active/ takes care of chickens Refills needed today: all Immunizations needed: Shingles declined Immunization History  Administered Date(s) Administered   Fluad Quad(high Dose 65+) 03/21/2019, 03/25/2020, 04/01/2021, 03/30/2022   Influenza Inj Mdck Quad Pf 03/21/2019   Influenza, High Dose Seasonal PF 03/16/2018   Influenza,inj,Quad PF,6+ Mos 05/21/2013, 03/13/2014, 03/10/2015, 03/14/2016, 03/08/2017   Moderna Sars-Covid-2 Vaccination 03/25/2020, 04/29/2020, 11/24/2020   Pneumococcal Conjugate-13 06/06/2014   Pneumococcal Polysaccharide-23 05/21/2013   Tdap 11/24/2020     Past Medical History:  Diagnosis Date   Anemia    hx of   Arthritis    hands/knees   Blood clot in vein    RT ARM WITH PICC LINE    C. difficile colitis 08/19/2013   Colon cancer (HCC) 07/18/12 bx   rectum=Invasive adenocarcinoma w/ extracellular mucin,polyp=benign   GERD (gastroesophageal reflux disease)    OCCASIONAL   Hearing loss    partial greater on left   Heart murmur    Hematochezia    recent   Herpes zoster - R flank - with severe pain 03/11/2013   History of shingles    Hyperlipidemia    Hypertension    Hypothyroidism as child   hx of   Ileostomy - diverting loop - s/p takedown 06/28/2013 01/14/2013   Mitral regurgitation due to partially flail posterior mitral valve leaflet    Nocturia    Numbness    TOES / FINGERS DUE TO CHEMO   Peripheral vascular disease (HCC)    neuropathy due to cancer treatment (fingers and toes)   Radiation    FOR COLON CANCER   Rectal cancer (HCC)    Rheumatic fever as child   hx   Sinusitis    seasonal   Staphylococcus  aureus bacteremia 07/12/2019   Staphylococcus aureus sepsis (HCC) 07/17/2019   Tinnitus    left ear   Umbilical hernia, incarcerated - s/p primary repair July 2014 01/03/2013   Social History   Socioeconomic History   Marital status: Widowed    Spouse name: Not on file   Number of children: 2   Years of education: 9   Highest education level: 9th grade  Occupational History   Occupation: Retired    Associate Professor: Public librarian    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  Has two adult children and one grandchild. One son lives with him.    Social Determinants of Health   Financial Resource Strain: Low Risk  (12/01/2022)   Overall Financial Resource Strain (CARDIA)    Difficulty of Paying Living  Expenses: Not hard at all  Food Insecurity: No Food Insecurity (12/01/2022)   Hunger Vital Sign    Worried About Running Out of Food in the Last Year: Never true    Ran Out of Food in the Last Year: Never true  Transportation Needs: No Transportation Needs (12/01/2022)   PRAPARE - Administrator, Civil Service (Medical): No    Lack of Transportation (Non-Medical): No  Physical Activity: Insufficiently Active (12/01/2022)   Exercise Vital Sign    Days of Exercise per Week: 3 days    Minutes of Exercise per Session: 30 min  Stress: No Stress Concern Present (12/01/2022)   [employer] of Occupational Health - Occupational Stress Questionnaire    Feeling of Stress : Not at all  Social Connections:  Moderately Isolated (12/01/2022)   Social Connection and Isolation Panel [NHANES]    Frequency of Communication with Friends and Family: More than three times a week    Frequency of Social Gatherings with Friends and Family: More than three times a week    Attends Religious Services: 1 to 4 times per year    Active Member of [employer] or Organizations: No    Attends Banker Meetings: Never    Marital Status: Widowed  Intimate Partner Violence: Not At Risk (12/01/2022)   Humiliation, Afraid, Rape, and Kick questionnaire    Fear of Current or Ex-Partner: No    Emotionally Abused: No    Physically Abused: No    Sexually Abused: No   Past Surgical History:  Procedure Laterality Date   CARPAL TUNNEL RELEASE Right 2002   ILEOSTOMY N/A 12/27/2012   Procedure: DIVERTING LOOP ILEOSTOMY;  Surgeon: Ardeth Sportsman, MD;  Location: [facility];  Service: General;  Laterality: N/A;   ILEOSTOMY CLOSURE N/A 06/28/2013   Procedure: loop ILEOSTOMY TAKEDOWN examination of anesthesia ;  Surgeon: Ardeth Sportsman, MD;  Location: [facility];  Service: General;  Laterality: N/A;   INGUINAL HERNIA REPAIR Left    INGUINAL HERNIA REPAIR Bilateral 08/13/2015   Procedure: LAPAROSCOPIC BILATERAL INGUINAL HERNIA REPAIR WITH MESH;  Surgeon: Karie Soda, MD;  Location: [facility];  Service: General;  Laterality: Bilateral;   INSERTION OF MESH Bilateral 08/13/2015   Procedure: INSERTION OF MESH;  Surgeon: Karie Soda, MD;  Location: [facility];  Service: General;  Laterality: Bilateral;   LAPAROSCOPIC LOW ANTERIOR RESCECTION WITH COLOANAL ANASTOMOSIS N/A 12/27/2012   Procedure: LAPAROSCOPIC LOW ANTERIOR RESCECTION WITH COLOANAL ANASTOMOSIS;  Surgeon: Ardeth Sportsman, MD;  Location: [facility];  Service: General;  Laterality: N/A;   LAPAROSCOPIC LOW ANTERIOR RESECTION N/A 12/27/2012   Procedure: LAPAROSCOPIC LOW ANTERIOR RESECTION, COLO-ANAL ANASTOMOSIS, DIVERTING LOOP ILEOSTOMY,SPLENIC FLEXURE MOBILIZATION, PRIMARY INCARCERATED UMBILICAL  HERNIA REPAIR;  Surgeon: Ardeth Sportsman, MD;  Location: [facility];  Service: General;  Laterality: N/A;   TEE WITHOUT CARDIOVERSION N/A 07/10/2013   Procedure: TRANSESOPHAGEAL ECHOCARDIOGRAM (TEE);  Surgeon: Donato Schultz, MD;  [facility] END[facility];  Service: Cardiovascular;  Laterality: N/A;   TEE WITHOUT CARDIOVERSION N/A 08/21/2013   Procedure: TRANSESOPHAGEAL ECHOCARDIOGRAM (TEE);  Surgeon: Thurmon Fair, MD;  Location: [facility] ENDOSCOPY;  Service: Cardiovascular;  Laterality: N/A;   TEE WITHOUT CARDIOVERSION N/A 07/16/2019   Procedure: TRANSESOPHAGEAL ECHOCARDIOGRAM (TEE) WITH PROPOFOL;  Surgeon: Antoine Poche, MD;  Location: [facility];  Service: Endoscopy;  Laterality: N/A;   TONSILLECTOMY     as child   UMBILICAL HERNIA REPAIR  12/27/2012   Procedure: PRIMARY REPAIR INCARCERATED UMBILICAL HERNIA;  Surgeon: Ardeth Sportsman, MD;  Location: [facility];  Service: General;;   Family History  Problem Relation Age of Onset   Cancer Mother        abdominal ca ?ovarian?   Heart disease Father    Cancer Sister        liver cancer   Heart disease Brother    COPD Brother    Healthy Son    Healthy Son     Current Outpatient Medications:    acetaminophen (TYLENOL) 500 MG tablet, Take 1,000 mg by mouth every 6 (six) hours as needed for headache. , Disp: , Rfl:    clotrimazole-betamethasone (LOTRISONE) cream, Apply 1 application topically 2 (two) times daily. To neck for rash for up to 2 weeks., Disp: 30 g, Rfl: 0   diclofenac Sodium (VOLTAREN) 1 % GEL, APPLY 4 GRAMS TOPICALLY 4 TIMES A DAY FOR JOINT PAIN, Disp: 400 g, Rfl: 0   diphenoxylate-atropine (LOMOTIL) 2.5-0.025 MG tablet, TAKE 1 TABLET BY MOUTH ONCE DAILY AS NEEDED FOR DIARRHEA OR LOOSE STOOLS, Disp: 30 tablet, Rfl: 0   gabapentin (NEURONTIN) 100 MG capsule, Take 2 capsules (200 mg total) by mouth 2 (two) times daily as needed (nerve pain- DO NOT FILL UNTIL AFTER 06/06/2022.)., Disp: 180 capsule, Rfl: 3   hydrochlorothiazide (HYDRODIURIL) 25 MG  tablet, TAKE 1 TABLET BY MOUTH ONCE DAILY STOP  CHLORTHALIDONE, Disp: 90 tablet, Rfl: 0   hydrocortisone (PROCTO-MED HC) 2.5 % rectal cream, INSERT 1 APPLICATORFUL RECTALLY TWICE DAILY AS NEEDED FOR HEMORRHOIDS OR RECTAL ITCHING FOR 7 TO 10 DAYS, Disp: 28 g, Rfl: 0   ketoconazole (NIZORAL) 2 % cream, Apply 1 application topically 2 (two) times daily., Disp: 60 g, Rfl: 1   lisinopril (ZESTRIL) 40 MG tablet, Take 1 tablet by mouth once daily, Disp: 90 tablet, Rfl: 1   Multiple Vitamin (MULTIVITAMIN WITH MINERALS) TABS, Take 1 tablet by mouth every morning. , Disp: , Rfl:    Olopatadine HCl 0.2 % SOLN, Instill 1 drop in each eye ONCE daily., Disp: 2.5 mL, Rfl: 12   oxymetazoline (AFRIN) 0.05 % nasal spray, Place 1 spray into both nostrils 2 (two) times daily as needed for congestion., Disp: , Rfl:    pravastatin (PRAVACHOL) 20 MG tablet, Take 1 tablet by mouth once daily, Disp: 90 tablet, Rfl: 0   Probiotic Product (DIGESTIVE ADVANTAGE PO), Take 1 tablet by mouth daily., Disp: , Rfl:    psyllium (METAMUCIL) 58.6 % packet, Take 1 packet by mouth daily., Disp: , Rfl:    triamcinolone cream (KENALOG) 0.1 %, APPLY  CREAM TOPICALLY TO AFFECTED AREA THREE TIMES DAILY AS NEEDED, Disp: 80 g, Rfl: 1  Allergies  Allergen Reactions   Shrimp [Shellfish Allergy] Hives, Nausea And Vomiting and Rash    "only shrimp" - crustaceans - blisters, n/v      ROS: Review of Systems A comprehensive review of systems was negative except for: Eyes: positive for contacts/glasses Integument/breast: positive for dryness and rash Musculoskeletal: positive for arthralgias    Physical exam BP 129/81   Pulse 79   Temp 97.7 F (36.5 C) (Temporal)   Ht 5\' 10"  (1.778 m)   Wt 175 lb 3.2 oz (79.5 kg)   SpO2 97%   BMI 25.14 kg/m  General appearance: alert, cooperative, appears stated age, and no distress Head: Normocephalic, without obvious abnormality, atraumatic Eyes: negative findings: lids and lashes normal,  conjunctivae and sclerae normal, corneas clear, and pupils equal, round, reactive to light and accomodation Ears:  right TM obscured by cerumen, left  TM normal Nose: Nares normal. Septum midline. Mucosa normal. No drainage or sinus tenderness. Throat: lips, mucosa, and tongue normal; teeth and gums normal Neck: no adenopathy, supple, symmetrical, trachea midline, thyroid not enlarged, symmetric, no tenderness/mass/nodules, and radiation of heart murmur to carotids bilaterally Back:  Increased kyphosis of thoracic spine present Lungs: clear to auscultation bilaterally Chest wall: no tenderness Heart:  Regular rate and rhythm with 2/6 systolic ejection murmur that radiates to carotids bilaterally Abdomen: soft, non-tender; bowel sounds normal; no masses,  no organomegaly and postsurgical changes present Extremities: extremities normal, atraumatic, no cyanosis or edema.  He does have arthritic changes to bilateral knees.  No gross joint effusion or erythema appreciated Pulses: 2+ and symmetric Skin:  Slight dermatitis noted along the lateral aspect of the neck Lymph nodes: Cervical, supraclavicular, and axillary nodes normal. Neurologic: Grossly normal Psych: Mood stable, speech normal, affect appropriate   Assessment/ Plan: [name] here for annual physical exam.   Annual physical exam  Primary hypertension - Plan: CMP14+EGFR, lisinopril (ZESTRIL) 40 MG tablet, hydrochlorothiazide (HYDRODIURIL) 25 MG tablet  Hypertriglyceridemia - Plan: CMP14+EGFR, Lipid Panel, TSH, pravastatin (PRAVACHOL) 20 MG tablet  Macrocytic anemia - Plan: CBC  History of colorectal cancer - Plan: CBC  Benign prostatic hyperplasia with incomplete bladder emptying - Plan: PSA  Neuropathic pain - Plan: gabapentin (NEURONTIN) 300 MG capsule  Declines shingles vaccination.  Fasting labs collected today.  Blood pressure medications and cholesterol medications renewed  Check CBC given history of colorectal  cancer and macrocytic anemia.  Not experience any concerning rectal bleeding at this time  Check PSA level given history of elevation in the past.  Experiencing some incomplete bladder emptying but this may be postradiation changes given history of colon cancer  I have advanced his gabapentin to 300 mg twice daily.  Caution sedation.  Will see him back in about 3 weeks for bilateral knee injection.  His son will bring him to appointment  Counseled on healthy lifestyle choices, including diet (rich in fruits, vegetables and lean meats and low in salt and simple carbohydrates) and exercise (at least 30 minutes of moderate physical activity daily).     Daran Favaro M. Nadine Counts, DO

## 2022-12-14 NOTE — Patient Instructions (Signed)
Preventive Care 65 Years and Older, Male Preventive care refers to lifestyle choices and visits with your health care provider that can promote health and wellness. Preventive care visits are also called wellness exams. What can I expect for my preventive care visit? Counseling During your preventive care visit, your health care provider may ask about your: Medical history, including: Past medical problems. Family medical history. History of falls. Current health, including: Emotional well-being. Home life and relationship well-being. Sexual activity. Memory and ability to understand (cognition). Lifestyle, including: Alcohol, nicotine or tobacco, and drug use. Access to firearms. Diet, exercise, and sleep habits. Work and work environment. Sunscreen use. Safety issues such as seatbelt and bike helmet use. Physical exam Your health care provider will check your: Height and weight. These may be used to calculate your BMI (body mass index). BMI is a measurement that tells if you are at a healthy weight. Waist circumference. This measures the distance around your waistline. This measurement also tells if you are at a healthy weight and may help predict your risk of certain diseases, such as type 2 diabetes and high blood pressure. Heart rate and blood pressure. Body temperature. Skin for abnormal spots. What immunizations do I need?  Vaccines are usually given at various ages, according to a schedule. Your health care provider will recommend vaccines for you based on your age, medical history, and lifestyle or other factors, such as travel or where you work. What tests do I need? Screening Your health care provider may recommend screening tests for certain conditions. This may include: Lipid and cholesterol levels. Diabetes screening. This is done by checking your blood sugar (glucose) after you have not eaten for a while (fasting). Hepatitis C test. Hepatitis B test. HIV (human  immunodeficiency virus) test. STI (sexually transmitted infection) testing, if you are at risk. Lung cancer screening. Colorectal cancer screening. Prostate cancer screening. Abdominal aortic aneurysm (AAA) screening. You may need this if you are a current or former smoker. Talk with your health care provider about your test results, treatment options, and if necessary, the need for more tests. Follow these instructions at home: Eating and drinking  Eat a diet that includes fresh fruits and vegetables, whole grains, lean protein, and low-fat dairy products. Limit your intake of foods with high amounts of sugar, saturated fats, and salt. Take vitamin and mineral supplements as recommended by your health care provider. Do not drink alcohol if your health care provider tells you not to drink. If you drink alcohol: Limit how much you have to 0-2 drinks a day. Know how much alcohol is in your drink. In the [place] one drink equals one 12 oz bottle of beer (355 mL), one 5 oz glass of wine (148 mL), or one 1 oz glass of hard liquor (44 mL). Lifestyle Brush your teeth every morning and night with fluoride toothpaste. Floss one time each day. Exercise for at least 30 minutes 5 or more days each week. Do not use any products that contain nicotine or tobacco. These products include cigarettes, chewing tobacco, and vaping devices, such as e-cigarettes. If you need help quitting, ask your health care provider. Do not use drugs. If you are sexually active, practice safe sex. Use a condom or other form of protection to prevent STIs. Take aspirin only as told by your health care provider. Make sure that you understand how much to take and what form to take. Work with your health care provider to find out whether it is safe   and beneficial for you to take aspirin daily. Ask your health care provider if you need to take a cholesterol-lowering medicine (statin). Find healthy ways to manage stress, such  as: Meditation, yoga, or listening to music. Journaling. Talking to a trusted person. Spending time with friends and family. Safety Always wear your seat belt while driving or riding in a vehicle. Do not drive: If you have been drinking alcohol. Do not ride with someone who has been drinking. When you are tired or distracted. While texting. If you have been using any mind-altering substances or drugs. Wear a helmet and other protective equipment during sports activities. If you have firearms in your house, make sure you follow all gun safety procedures. Minimize exposure to UV radiation to reduce your risk of skin cancer. What's next? Visit your health care provider once a year for an annual wellness visit. Ask your health care provider how often you should have your eyes and teeth checked. Stay up to date on all vaccines. This information is not intended to replace advice given to you by your health care provider. Make sure you discuss any questions you have with your health care provider. Document Revised: 11/18/2020 Document Reviewed: 11/18/2020 Elsevier Patient Education  2024 Elsevier Inc.  

## 2022-12-15 LAB — LIPID PANEL
Chol/HDL Ratio: 3.8 ratio (ref 0.0–5.0)
Cholesterol, Total: 155 mg/dL (ref 100–199)
HDL: 41 mg/dL (ref >39)
LDL Chol Calc (NIH): 90 mg/dL (ref 0–99)
Triglycerides: 138 mg/dL (ref 0–149)
VLDL Cholesterol Cal: 24 mg/dL (ref 5–40)

## 2022-12-15 LAB — CBC
Hematocrit: 34.9 % — ABNORMAL LOW (ref 37.5–51.0)
Hemoglobin: 12.2 g/dL — ABNORMAL LOW (ref 13.0–17.7)
MCH: 34.1 pg — ABNORMAL HIGH (ref 26.6–33.0)
MCHC: 35 g/dL (ref 31.5–35.7)
MCV: 98 fL — ABNORMAL HIGH (ref 79–97)
Platelets: 165 10*3/uL (ref 150–450)
RBC: 3.58 x10E6/uL — ABNORMAL LOW (ref 4.14–5.80)
RDW: 12.9 % (ref 11.6–15.4)
WBC: 8.5 10*3/uL (ref 3.4–10.8)

## 2022-12-15 LAB — CMP14+EGFR
ALT: 22 IU/L (ref 0–44)
AST: 19 IU/L (ref 0–40)
Albumin: 4.5 g/dL (ref 3.8–4.8)
Alkaline Phosphatase: 48 IU/L (ref 44–121)
BUN/Creatinine Ratio: 18 (ref 10–24)
BUN: 22 mg/dL (ref 8–27)
Bilirubin Total: 1.2 mg/dL (ref 0.0–1.2)
CO2: 25 mmol/L (ref 20–29)
Calcium: 10 mg/dL (ref 8.6–10.2)
Chloride: 100 mmol/L (ref 96–106)
Creatinine, Ser: 1.25 mg/dL (ref 0.76–1.27)
Globulin, Total: 1.8 g/dL (ref 1.5–4.5)
Glucose: 98 mg/dL (ref 70–99)
Potassium: 4.7 mmol/L (ref 3.5–5.2)
Sodium: 140 mmol/L (ref 134–144)
Total Protein: 6.3 g/dL (ref 6.0–8.5)
eGFR: 59 mL/min/{1.73_m2} — ABNORMAL LOW (ref >59)

## 2022-12-15 LAB — PSA: Prostate Specific Ag, Serum: 5 ng/mL — ABNORMAL HIGH (ref 0.0–4.0)

## 2022-12-15 LAB — TSH: TSH: 4.96 u[IU]/mL — ABNORMAL HIGH (ref 0.450–4.500)

## 2022-12-16 NOTE — Addendum Note (Signed)
Addended by: Daisy Blossom on: 12/16/2022 05:04 PM   Modules accepted: Orders

## 2023-01-03 ENCOUNTER — Encounter: Payer: Self-pay | Admitting: Family Medicine

## 2023-01-03 ENCOUNTER — Ambulatory Visit (INDEPENDENT_AMBULATORY_CARE_PROVIDER_SITE_OTHER): Payer: PPO | Admitting: Family Medicine

## 2023-01-03 VITALS — BP 112/66 | HR 69 | Temp 97.9°F | Ht 70.0 in | Wt 173.0 lb

## 2023-01-03 DIAGNOSIS — R972 Elevated prostate specific antigen [PSA]: Secondary | ICD-10-CM | POA: Diagnosis not present

## 2023-01-03 DIAGNOSIS — M170 Bilateral primary osteoarthritis of knee: Secondary | ICD-10-CM | POA: Diagnosis not present

## 2023-01-03 DIAGNOSIS — M25562 Pain in left knee: Secondary | ICD-10-CM | POA: Diagnosis not present

## 2023-01-03 DIAGNOSIS — M25561 Pain in right knee: Secondary | ICD-10-CM | POA: Diagnosis not present

## 2023-01-03 DIAGNOSIS — N289 Disorder of kidney and ureter, unspecified: Secondary | ICD-10-CM | POA: Diagnosis not present

## 2023-01-03 DIAGNOSIS — G8929 Other chronic pain: Secondary | ICD-10-CM | POA: Diagnosis not present

## 2023-01-03 DIAGNOSIS — D539 Nutritional anemia, unspecified: Secondary | ICD-10-CM | POA: Diagnosis not present

## 2023-01-03 LAB — ANEMIA PROFILE B
Basophils Absolute: 0.1 10*3/uL (ref 0.0–0.2)
Basos: 1 %
EOS (ABSOLUTE): 0.5 10*3/uL — ABNORMAL HIGH (ref 0.0–0.4)
Eos: 6 %
Hematocrit: 35.9 % — ABNORMAL LOW (ref 37.5–51.0)
Hemoglobin: 12 g/dL — ABNORMAL LOW (ref 13.0–17.7)
Immature Grans (Abs): 0 10*3/uL (ref 0.0–0.1)
Immature Granulocytes: 1 %
Lymphocytes Absolute: 1.1 10*3/uL (ref 0.7–3.1)
Lymphs: 15 %
MCH: 33.8 pg — ABNORMAL HIGH (ref 26.6–33.0)
MCHC: 33.4 g/dL (ref 31.5–35.7)
MCV: 101 fL — ABNORMAL HIGH (ref 79–97)
Monocytes Absolute: 0.8 10*3/uL (ref 0.1–0.9)
Monocytes: 11 %
Neutrophils Absolute: 4.7 10*3/uL (ref 1.4–7.0)
Neutrophils: 66 %
Platelets: 154 10*3/uL (ref 150–450)
RBC: 3.55 x10E6/uL — ABNORMAL LOW (ref 4.14–5.80)
RDW: 13.6 % (ref 11.6–15.4)
Retic Ct Pct: 1 % (ref 0.6–2.6)
WBC: 7.3 10*3/uL (ref 3.4–10.8)

## 2023-01-03 LAB — BASIC METABOLIC PANEL

## 2023-01-03 MED ORDER — METHYLPREDNISOLONE ACETATE 40 MG/ML IJ SUSP
40.0000 mg | Freq: Once | INTRAMUSCULAR | Status: AC
Start: 2023-01-03 — End: 2023-01-03
  Administered 2023-01-03: 40 mg via INTRAMUSCULAR

## 2023-01-03 NOTE — Progress Notes (Signed)
Subjective: Tyler Diaz PCP: Tyler Ip, Tyler Diaz Tyler Diaz is a 77 y.o. male presenting to clinic today for:  1. Chronic knee Diaz due to OA Patient reports bilateral knee Diaz, right greater than left.  No instability.  He is needed corticosteroid injections to the knees before and had done this with orthopedics but he would like to try and get them done here.  Last injection was quite a time ago.  Does not report any falls.  He continues to remain very physically active and in fact that all of his chickens prior to arrival today.  2.  Elevation of PSA Patient has previously been told he has enlarged prostate and was put on prostate medications but notes he could not tolerate.  He has history of colorectal cancer status post radiation.  He has not seen a urologist but would be fine with seeing 1.  3.  Anemia No reports of bleeding.  P.o. intake has been good.  Remains physically active.  No report of shortness of breath, dizziness  ROS: Per HPI  Allergies  Allergen Reactions   Shrimp [Shellfish Allergy] Hives, Nausea And Vomiting and Rash    "only shrimp" - crustaceans - blisters, n/v    Past Medical History:  Diagnosis Date   Anemia    hx of   Arthritis    hands/knees   Blood clot in vein    RT ARM WITH PICC LINE    C. difficile colitis 08/19/2013   Colon cancer (HCC) 07/18/12 bx   rectum=Invasive adenocarcinoma w/ extracellular mucin,polyp=benign   GERD (gastroesophageal reflux disease)    OCCASIONAL   Hearing loss    partial greater on left   Heart murmur    Hematochezia    recent   Herpes zoster - R flank - with severe Diaz 03/11/2013   History of shingles    Hyperlipidemia    Hypertension    Hypothyroidism as child   hx of   Ileostomy - diverting loop - s/p takedown 06/28/2013 01/14/2013   Mitral regurgitation due to partially flail posterior mitral valve leaflet    Nocturia    Numbness    TOES / FINGERS DUE TO CHEMO   Peripheral vascular  disease (HCC)    neuropathy due to cancer treatment (fingers and toes)   Radiation    FOR COLON CANCER   Rectal cancer (HCC)    Rheumatic fever as child   hx   Sinusitis    seasonal   Staphylococcus aureus bacteremia 07/12/2019   Staphylococcus aureus sepsis (HCC) 07/17/2019   Tinnitus    left ear   Umbilical hernia, incarcerated - s/p primary repair July 2014 01/03/2013    Current Outpatient Medications:    acetaminophen (TYLENOL) 500 MG tablet, Take 1,000 mg by mouth every 6 (six) hours as needed for headache. , Disp: , Rfl:    clotrimazole-betamethasone (LOTRISONE) cream, Apply 1 application topically 2 (two) times daily. To neck for rash for up to 2 weeks., Disp: 30 g, Rfl: 0   diclofenac Sodium (VOLTAREN) 1 % GEL, APPLY 4 GRAMS TOPICALLY 4 TIMES A DAY FOR JOINT Diaz, Disp: 400 g, Rfl: 0   gabapentin (NEURONTIN) 300 MG capsule, Take 1 capsule (300 mg total) by mouth 2 (two) times daily as needed (nerve Diaz)., Disp: 180 capsule, Rfl: 3   hydrochlorothiazide (HYDRODIURIL) 25 MG tablet, TAKE 1 TABLET BY MOUTH ONCE DAILY STOP  CHLORTHALIDONE, Disp: 90 tablet, Rfl: 3   hydrocortisone (PROCTO-MED HC) 2.5 %  rectal cream, INSERT 1 APPLICATORFUL RECTALLY TWICE DAILY AS NEEDED FOR HEMORRHOIDS OR RECTAL ITCHING FOR 7 TO 10 DAYS, Disp: 28 g, Rfl: 1   ketoconazole (NIZORAL) 2 % cream, Apply 1 application topically 2 (two) times daily., Disp: 60 g, Rfl: 1   lisinopril (ZESTRIL) 40 MG tablet, Take 1 tablet (40 mg total) by mouth daily., Disp: 90 tablet, Rfl: 3   Multiple Vitamin (MULTIVITAMIN WITH MINERALS) TABS, Take 1 tablet by mouth every morning. , Disp: , Rfl:    Olopatadine HCl 0.2 % SOLN, Instill 1 drop in each eye ONCE daily., Disp: 2.5 mL, Rfl: 12   oxymetazoline (AFRIN) 0.05 % nasal spray, Place 1 spray into both nostrils 2 (two) times daily as needed for congestion., Disp: , Rfl:    pravastatin (PRAVACHOL) 20 MG tablet, Take 1 tablet (20 mg total) by mouth daily., Disp: 90 tablet, Rfl: 3    Probiotic Product (DIGESTIVE ADVANTAGE PO), Take 1 tablet by mouth daily., Disp: , Rfl:    psyllium (METAMUCIL) 58.6 % packet, Take 1 packet by mouth daily., Disp: , Rfl:    triamcinolone cream (KENALOG) 0.1 %, APPLY  CREAM TOPICALLY TO AFFECTED AREA THREE TIMES DAILY AS NEEDED, Disp: 80 g, Rfl: 1 Social History   Socioeconomic History   Marital status: Widowed    Spouse name: Not on file   Number of children: 2   Years of education: 9   Highest education level: 9th grade  Occupational History   Occupation: Retired    Associate Professor: Public librarian    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  Has two adult children and one grandchild. One son lives with him.    Social Determinants of Health   Financial Resource Strain: Low Risk  (12/01/2022)   Overall Financial Resource Strain (CARDIA)    Difficulty of Paying Living Expenses: Not hard at all  Food Insecurity: No Food Insecurity (12/01/2022)   Hunger Vital Sign    Worried About Running Out of Food in the Last Year: Never true    Ran Out of Food in the Last Year: Never true  Transportation Needs: No Transportation Needs (12/01/2022)   PRAPARE - Administrator, Civil Service (Medical): No    Lack of Transportation (Non-Medical): No  Physical Activity: Insufficiently Active (12/01/2022)   Exercise Vital Sign    Days of Exercise per Week: 3 days    Minutes of Exercise per Session: 30 min  Stress: No Stress Concern Present  (12/01/2022)   [employer] of Occupational Health - Occupational Stress Questionnaire    Feeling of Stress : Not at all  Social Connections: Moderately Isolated (12/01/2022)   Social Connection and Isolation Panel [NHANES]    Frequency of Communication with Friends and Family: More than three times a week    Frequency of Social Gatherings with Friends and Family: More than three  times a week    Attends Religious Services: 1 to 4 times per year    Active Member of Clubs or Organizations: No    Attends Banker Meetings: Never    Marital Status: Widowed  Intimate Partner Violence: Not At Risk (12/01/2022)   Humiliation, Afraid, Rape, and Kick questionnaire    Fear of Current or Ex-Partner: No    Emotionally Abused: No    Physically Abused: No    Sexually Abused: No   Family History  Problem Relation Age of Onset   Cancer Mother        abdominal ca ?ovarian?   Heart disease Father    Cancer Sister        liver cancer   Heart disease Brother    COPD Brother    Healthy Son    Healthy Son     Objective: Office vital signs reviewed. BP 112/66   Pulse 69   Temp 97.9 F (36.6 C)   Ht 5\' 10"  (1.778 m)   Wt 173 lb (78.5 kg)   SpO2 96%   BMI 24.82 kg/m   Physical Examination:  General: Awake, alert, well nourished, No acute distress HEENT: no conjunctival pallor Pulm: normal WOB on room air. MSK: Ambulating independently.  Gait is slow but normal.  He has osteoarthritic changes to bilateral knees.  There is no gross joint effusion, soft tissue swelling, erythema or warmth.  Range of motion is somewhat limited in extension due to arthritis.  He has an insect bite at the left medial leg just above the knee that does not appear to be infected  Right knee JOINT INJECTION:  Patient denies allergy to antiseptics (including iodine) and anesthetics.  Patient denies h/o diabetes, frequent steroid use, use of blood thinners/ antiplatelets.  Patient was given informed  consent and a signed copy has been placed in the chart. Appropriate time out was taken. Area prepped and draped in usual sterile fashion. Anatomic landmarks were identified and injection site was marked.  Ethyl chloride spray was used to numb the area and 1 cc of methylprednisolone 40 mg/ml plus  3 cc of 1% lidocaine without epinephrine was injected into the right knee  using a(n) anteriolateral approach. The patient tolerated the procedure well and there were no immediate complications. Estimated blood loss is less than 1 cc.  Post procedure instructions were reviewed and handout outlining these instructions were provided to patient.  Left knee JOINT INJECTION:  Patient was given informed consent and a signed copy has been placed in the chart. Appropriate time out was taken. Area prepped and draped in usual sterile fashion. Anatomic landmarks were identified and injection site was marked.  Ethyl chloride spray was used to numb the area and 1 cc of methylprednisolone 40 mg/ml plus  3 cc of 1% lidocaine without epinephrine was injected into the right knee using a(n) anteriolateral approach. The patient tolerated the procedure well and there were no immediate complications. Estimated blood loss is less than 3 cc.  Post procedure instructions were reviewed and handout outlining these instructions were provided to patient.  Assessment/ Plan: 77 y.o. male   Primary osteoarthritis of both knees  Chronic Diaz of both knees - Plan: methylPREDNISolone acetate (DEPO-MEDROL) injection 40 mg, methylPREDNISolone acetate (DEPO-MEDROL) injection 40 mg  Elevated PSA - Plan: Ambulatory referral to Urology  Macrocytic anemia - Plan: Anemia Profile B  Impaired renal function - Plan: Basic Metabolic Panel  Corticosteroid injection administered bilaterally today.  No immediate complications.  Home care instructions reviewed and reasons for reevaluation discussed.  He will follow-up as needed  Discussed elevation of  PSA.  Given history of radiation to colorectum for cancer I would like him to see urologist to ensure that we Tyler Diaz not have any concerns.  I suspect digital rectal exam will be difficult secondary to history of colorectal radiation  Check anemia profile, recheck renal function given abnormalities noted on last lab draw  No orders of the defined types were placed in this encounter.  No orders of the defined types were placed in this encounter.    Tyler Ip, Tyler Diaz [facility] [phone]

## 2023-01-03 NOTE — Patient Instructions (Signed)

## 2023-01-13 ENCOUNTER — Encounter: Payer: Self-pay | Admitting: Cardiology

## 2023-01-13 ENCOUNTER — Ambulatory Visit: Payer: PPO | Admitting: Cardiology

## 2023-01-13 VITALS — BP 128/70 | HR 70 | Ht 70.0 in | Wt 172.2 lb

## 2023-01-13 DIAGNOSIS — I10 Essential (primary) hypertension: Secondary | ICD-10-CM

## 2023-01-13 DIAGNOSIS — R0989 Other specified symptoms and signs involving the circulatory and respiratory systems: Secondary | ICD-10-CM

## 2023-01-13 DIAGNOSIS — E782 Mixed hyperlipidemia: Secondary | ICD-10-CM | POA: Diagnosis not present

## 2023-01-13 DIAGNOSIS — I340 Nonrheumatic mitral (valve) insufficiency: Secondary | ICD-10-CM

## 2023-01-13 NOTE — Progress Notes (Signed)
Clinical Summary Tyler Diaz is a 77 y.o.male seen today for follow up of the following medical problems.       1. Mitral regurgitation - 11/2017 echo LVEF 60%, moderate to severe MR. Partial flail cusp of posterior leaflet. LVIDs 3.6 - 08/2013 TEE flail segment of P2 scallop posterior leaflet due to ruptured chordae, moderate to severe MR   03/2019 TTE There is posterior leaflet prolapse and  a calcified density on the atrial side of the posterior leaflet that could  represent a partial flail subsegment of the posterior leaflet or perhaps  an old vegetation. There is  eccentric, anteriorly directed, moderate to severe mitral valve  Regurgitation    07/2019 TEE: Ruptured chordae attached to the posterior leaflet with  partial prolapse of P2. Chronic finding when compared to 07/2013 TEE. There  is eccentric anterior directed MR thats difficult to quantify due to  eccentricity, appears moderate to  severe.    02/2021 echo: LVEF 60-65%, no WMAs, indet diastolic, normal RV, moderate MR, LVIDs 2.9  02/2022 echo: LVEF 60-65%, mod to severe MR, LVIDs 2.6 - no SOB/DOE, no LE edema    2. HTN -compliant with meds   3. Hyperlipidemia   - compliant with pravastatin - 11/2021 TC 139 TG 138 HDL 38 LDL 77 12/2022 TC 155 TG 138 HDL 41 LDL 90   4. Colorectal cancer - s/p surgery, currently cancer free.      Past Medical History:  Diagnosis Date   Anemia    hx of   Arthritis    hands/knees   Blood clot in vein    RT ARM WITH PICC LINE    C. difficile colitis 08/19/2013   Colon cancer (HCC) 07/18/12 bx   rectum=Invasive adenocarcinoma w/ extracellular mucin,polyp=benign   GERD (gastroesophageal reflux disease)    OCCASIONAL   Hearing loss    partial greater on left   Heart murmur    Hematochezia    recent   Herpes zoster - R flank - with severe pain 03/11/2013   History of shingles    Hyperlipidemia    Hypertension    Hypothyroidism as child   hx of   Ileostomy - diverting  loop - s/p takedown 06/28/2013 01/14/2013   Mitral regurgitation due to partially flail posterior mitral valve leaflet    Nocturia    Numbness    TOES / FINGERS DUE TO CHEMO   Peripheral vascular disease (HCC)    neuropathy due to cancer treatment (fingers and toes)   Radiation    FOR COLON CANCER   Rectal cancer (HCC)    Rheumatic fever as child   hx   Sinusitis    seasonal   Staphylococcus aureus bacteremia 07/12/2019   Staphylococcus aureus sepsis (HCC) 07/17/2019   Tinnitus    left ear   Umbilical hernia, incarcerated - s/p primary repair July 2014 01/03/2013     Allergies  Allergen Reactions   Shrimp [Shellfish Allergy] Hives, Nausea And Vomiting and Rash    "only shrimp" - crustaceans - blisters, n/v      Current Outpatient Medications  Medication Sig Dispense Refill   acetaminophen (TYLENOL) 500 MG tablet Take 1,000 mg by mouth every 6 (six) hours as needed for headache.      clotrimazole-betamethasone (LOTRISONE) cream Apply 1 application topically 2 (two) times daily. To neck for rash for up to 2 weeks. 30 g 0   diclofenac Sodium (VOLTAREN) 1 % GEL APPLY 4 GRAMS TOPICALLY 4  TIMES A DAY FOR JOINT PAIN 400 g 0   gabapentin (NEURONTIN) 300 MG capsule Take 1 capsule (300 mg total) by mouth 2 (two) times daily as needed (nerve pain). 180 capsule 3   hydrochlorothiazide (HYDRODIURIL) 25 MG tablet TAKE 1 TABLET BY MOUTH ONCE DAILY STOP  CHLORTHALIDONE 90 tablet 3   hydrocortisone (PROCTO-MED HC) 2.5 % rectal cream INSERT 1 APPLICATORFUL RECTALLY TWICE DAILY AS NEEDED FOR HEMORRHOIDS OR RECTAL ITCHING FOR 7 TO 10 DAYS 28 g 1   ketoconazole (NIZORAL) 2 % cream Apply 1 application topically 2 (two) times daily. 60 g 1   lisinopril (ZESTRIL) 40 MG tablet Take 1 tablet (40 mg total) by mouth daily. 90 tablet 3   Multiple Vitamin (MULTIVITAMIN WITH MINERALS) TABS Take 1 tablet by mouth every morning.      Olopatadine HCl 0.2 % SOLN Instill 1 drop in each eye ONCE daily. 2.5 mL 12    oxymetazoline (AFRIN) 0.05 % nasal spray Place 1 spray into both nostrils 2 (two) times daily as needed for congestion.     pravastatin (PRAVACHOL) 20 MG tablet Take 1 tablet (20 mg total) by mouth daily. 90 tablet 3   Probiotic Product (DIGESTIVE ADVANTAGE PO) Take 1 tablet by mouth daily.     psyllium (METAMUCIL) 58.6 % packet Take 1 packet by mouth daily.     triamcinolone cream (KENALOG) 0.1 % APPLY  CREAM TOPICALLY TO AFFECTED AREA THREE TIMES DAILY AS NEEDED 80 g 1   No current facility-administered medications for this visit.     Past Surgical History:  Procedure Laterality Date   CARPAL TUNNEL RELEASE Right 2002   ILEOSTOMY N/A 12/27/2012   Procedure: DIVERTING LOOP ILEOSTOMY;  Surgeon: Ardeth Sportsman, MD;  Location: [facility];  Service: General;  Laterality: N/A;   ILEOSTOMY CLOSURE N/A 06/28/2013   Procedure: loop ILEOSTOMY TAKEDOWN examination of anesthesia ;  Surgeon: Ardeth Sportsman, MD;  Location: [facility];  Service: General;  Laterality: N/A;   INGUINAL HERNIA REPAIR Left    INGUINAL HERNIA REPAIR Bilateral 08/13/2015   Procedure: LAPAROSCOPIC BILATERAL INGUINAL HERNIA REPAIR WITH MESH;  Surgeon: Karie Soda, MD;  Location: [facility];  Service: General;  Laterality: Bilateral;   INSERTION OF MESH Bilateral 08/13/2015   Procedure: INSERTION OF MESH;  Surgeon: Karie Soda, MD;  Location: [facility];  Service: General;  Laterality: Bilateral;   LAPAROSCOPIC LOW ANTERIOR RESCECTION WITH COLOANAL ANASTOMOSIS N/A 12/27/2012   Procedure: LAPAROSCOPIC LOW ANTERIOR RESCECTION WITH COLOANAL ANASTOMOSIS;  Surgeon: Ardeth Sportsman, MD;  Location: [facility];  Service: General;  Laterality: N/A;   LAPAROSCOPIC LOW ANTERIOR RESECTION N/A 12/27/2012   Procedure: LAPAROSCOPIC LOW ANTERIOR RESECTION, COLO-ANAL ANASTOMOSIS, DIVERTING LOOP ILEOSTOMY,SPLENIC FLEXURE MOBILIZATION, PRIMARY INCARCERATED UMBILICAL HERNIA REPAIR;  Surgeon: Ardeth Sportsman, MD;  Location: [facility];  Service: General;  Laterality: N/A;    TEE WITHOUT CARDIOVERSION N/A 07/10/2013   Procedure: TRANSESOPHAGEAL ECHOCARDIOGRAM (TEE);  Surgeon: Donato Schultz, MD;  Location: [facility] ENDOSCOPY;  Service: Cardiovascular;  Laterality: N/A;   TEE WITHOUT CARDIOVERSION N/A 08/21/2013   Procedure: TRANSESOPHAGEAL ECHOCARDIOGRAM (TEE);  Surgeon: Thurmon Fair, MD;  Location: [facility] ENDOSCOPY;  Service: Cardiovascular;  Laterality: N/A;   TEE WITHOUT CARDIOVERSION N/A 07/16/2019   Procedure: TRANSESOPHAGEAL ECHOCARDIOGRAM (TEE) WITH PROPOFOL;  Surgeon: Antoine Poche, MD;  Location: [facility];  Service: Endoscopy;  Laterality: N/A;   TONSILLECTOMY     as child   UMBILICAL HERNIA REPAIR  12/27/2012   Procedure: PRIMARY REPAIR INCARCERATED UMBILICAL HERNIA;  Surgeon: Ardeth Sportsman, MD;  Location: [facility];  Service: General;;     Allergies  Allergen Reactions   Shrimp [Shellfish Allergy] Hives, Nausea And Vomiting and Rash    "only shrimp" - crustaceans - blisters, n/v       Family History  Problem Relation Age of Onset   Cancer Mother        abdominal ca ?ovarian?   Heart disease Father    Cancer Sister        liver cancer   Heart disease Brother    COPD Brother    Healthy Son    Healthy Son      Social History Tyler Diaz reports that he quit smoking about 50 years ago. His smoking use included cigarettes. He started smoking about 70 years ago. He has a 30 pack-year smoking history. He has never used smokeless tobacco. Tyler Diaz reports no history of alcohol use.   Review of Systems CONSTITUTIONAL: No weight loss, fever, chills, weakness or fatigue.  HEENT: Eyes: No visual loss, blurred vision, double vision or yellow sclerae.No hearing loss, sneezing, congestion, runny nose or sore throat.  SKIN: No rash or itching.  CARDIOVASCULAR: per hpi RESPIRATORY: No shortness of breath, cough or sputum.  GASTROINTESTINAL: No anorexia, nausea, vomiting or diarrhea. No abdominal pain or blood.  GENITOURINARY: No burning on urination, no  polyuria NEUROLOGICAL: No headache, dizziness, syncope, paralysis, ataxia, numbness or tingling in the extremities. No change in bowel or bladder control.  MUSCULOSKELETAL: No muscle, back pain, joint pain or stiffness.  LYMPHATICS: No enlarged nodes. No history of splenectomy.  PSYCHIATRIC: No history of depression or anxiety.  ENDOCRINOLOGIC: No reports of sweating, cold or heat intolerance. No polyuria or polydipsia.  Marland Kitchen   Physical Examination Today's Vitals   01/13/23 0816  BP: 128/70  Pulse: 70  SpO2: 98%  Weight: 172 lb 3.2 oz (78.1 kg)  Height: 5\' 10"  (1.778 m)   Body mass index is 24.71 kg/m.  Gen: resting comfortably, no acute distress HEENT: no scleral icterus, pupils equal round and reactive, no palptable cervical adenopathy,  CV: RRR, 3/6 systolic murmur apex, no jvd Resp: Clear to auscultation bilaterally GI: abdomen is soft, non-tender, non-distended, normal bowel sounds, no hepatosplenomegaly MSK: extremities are warm, no edema.  Skin: warm, no rash Neuro:  no focal deficits Psych: appropriate affect   Diagnostic Studies  03/2019 echo IMPRESSIONS     1. Left ventricular ejection fraction, by visual estimation, is 55 to  60%. The left ventricle has normal function. Normal left ventricular size.  There is mildly increased left ventricular hypertrophy.   2. Global right ventricle has normal systolic function.The right  ventricular size is normal. No increase in right ventricular wall  thickness.   3. Left atrial size was mildly dilated.   4. Right atrial size was normal.   5. Mild aortic valve annular calcification.   6. The mitral valve is abnormal. There is posterior leaflet prolapse and  a calcified density on the atrial side of the posterior leaflet that could  represent a partial flail subsegment of the posterior leaflet or perhaps  an old vegetation. There is  eccentric, anteriorly directed, moderate to severe mitral valve  regurgitation. PISA  calculations are challenging with very eccentric jet,  but one image suggests MR radius is at least 1 cm which is consistent with  severe regurgitation. Suggest TEE for  further evaluation if clinically indicated.   7. The tricuspid valve is grossly normal. Tricuspid valve  regurgitation  is mild.   8. The aortic valve is tricuspid Aortic valve regurgitation is trivial by  color flow Doppler.   9. The pulmonic valve was grossly normal. Pulmonic valve regurgitation is  mild by color flow Doppler.  10. Normal pulmonary artery systolic pressure.  11. The tricuspid regurgitant velocity is 2.36 m/s, and with an assumed  right atrial pressure of 3 mmHg, the estimated right ventricular systolic  pressure is normal at 25.3 mmHg.  12. The inferior vena cava is normal in size with greater than 50%  respiratory variability, suggesting right atrial pressure of 3 mmHg.   In comparison to the previous echocardiogram(s): Previous Echo at [facility]  showed LV EF 60%, mild LAE, moderate to severe MR with a wall-impinging MR  jet and a flail cusp of the posterior leaflet. Mild MR, PI and dilated  IVC. These images were not able  to be viewed directly.  FINDINGS   Left Ventricle: Left ventricular ejection fraction, by visual estimation,  is 55 to 60%. The left ventricle has normal function. There is mildly  increased left ventricular hypertrophy. Normal left ventricular size.  Spectral Doppler shows Left ventricular   diastolic parameters were normal pattern of LV diastolic filling.      07/2019 TEE IMPRESSIONS     1. Left ventricular ejection fraction, by estimation, is 60 to 65%. The  left ventricle has normal function. The left ventrical has no regional  wall motion abnormalities. Left ventricular diastolic function could not  be evaluated.   2. Right ventricular systolic function is normal. The right ventricular  size is normal.   3. Left atrial size was moderately dilated. No left atrial/left  atrial  appendage thrombus was detected.   4. Right atrial size was moderately dilated.   5. Ruptured chordae attached to the posterior leaflet with partial  prolapse of P2. Chronic finding when compared to 07/2013 TEE. There is  eccentric anterior directed MR thats difficult to quantify due to  eccentricity, appears moderate to severe. . The  mitral valve is abnormal. Moderate to severe MR mitral valve  regurgitation. No evidence of mitral stenosis.   6. Tricuspid valve regurgitation is mild to moderate.   7. The aortic valve is tricuspid. Aortic valve regurgitation is trivial .  No aortic stenosis is present.     02/2021 echo IMPRESSIONS     1. Left ventricular ejection fraction, by estimation, is 60 to 65%. The  left ventricle has normal function. The left ventricle has no regional  wall motion abnormalities. Left ventricular diastolic parameters are  indeterminate.   2. Right ventricular systolic function is normal. The right ventricular  size is normal. There is mildly elevated pulmonary artery systolic  pressure.   3. Left atrial size was mildly dilated.   4. There is prolapse of a portion of the posterior MV leaflet with  resultant eccentric anterior MR. The MR VC is 0.6 cm. The MV/AV VTI ratio  is 1.3 supporting moderate MR. . The mitral valve is abnormal. Moderate  mitral valve regurgitation. No evidence  of mitral stenosis.   5. The aortic valve is tricuspid. Aortic valve regurgitation is mild. No  aortic stenosis is present.   6. The inferior vena cava is normal in size with greater than 50%  respiratory variability, suggesting right atrial pressure of 3 mmHg.      02/2022 echo IMPRESSIONS     1. Left ventricular ejection fraction, by estimation, is 60 to 65%. The  left ventricle  has normal function. The left ventricle has no regional  wall motion abnormalities. There is mild left ventricular hypertrophy.  Left ventricular diastolic parameters  were normal. The  average left ventricular global longitudinal strain is  -23.6 %. The global longitudinal strain is normal.   2. Right ventricular systolic function is normal. The right ventricular  size is normal. There is normal pulmonary artery systolic pressure.   3. Left atrial size was moderately dilated.   4. Prolapse of a portion of the posterior MV leaflet with eccentric  anterior MR. The eccentricity limits ability to quantify severity. MV/AV  VTI ratio 1.5 suggesting severe MR. Visually MR looks to be moderate to  severe. . The mitral valve is abnormal.  Moderate to severe mitral valve regurgitation. No evidence of mitral  stenosis.   5. The tricuspid valve is abnormal.   6. The aortic valve is tricuspid. There is mild calcification of the  aortic valve. There is mild thickening of the aortic valve. Aortic valve  regurgitation is not visualized. No aortic stenosis is present.   7. The inferior vena cava is normal in size with greater than 50%  respiratory variability, suggesting right atrial pressure of 3 mmHg.        Assessment and Plan   1. Mitral regurgitation - moderate to severe by echos over time without evidence of LV dysfunction or enlargement -remains asymptomatic - we will repeat his echo for ongoing surveillance.      2. HTN - he prefers HCTZ instead of chlorthaldone due to cost - has white coat HTN often elevated in clinic. - at goal, continue current meds   3. Hyperlipidemia - LDL at goal, continue current meds  F/u 6 months      Antoine Poche, M.D

## 2023-01-13 NOTE — Patient Instructions (Signed)
Medication Instructions:   Your physician recommends that you continue on your current medications as directed. Please refer to the Current Medication list given to you today.   Labwork: None today  Testing/Procedures: Your physician has requested that you have an echocardiogram. Echocardiography is a painless test that uses sound waves to create images of your heart. It provides your doctor with information about the size and shape of your heart and how well your heart's chambers and valves are working. This procedure takes approximately one hour. There are no restrictions for this procedure. Please do NOT wear cologne, perfume, aftershave, or lotions (deodorant is allowed). Please arrive 15 minutes prior to your appointment time.   Your physician has requested that you have a carotid duplex. This test is an ultrasound of the carotid arteries in your neck. It looks at blood flow through these arteries that supply the brain with blood. Allow one hour for this exam. There are no restrictions or special instructions.   Follow-Up: 6 months  Any Other Special Instructions Will Be Listed Below (If Applicable).  If you need a refill on your cardiac medications before your next appointment, please call your pharmacy.

## 2023-03-21 ENCOUNTER — Ambulatory Visit (INDEPENDENT_AMBULATORY_CARE_PROVIDER_SITE_OTHER): Payer: PPO

## 2023-03-21 DIAGNOSIS — Z23 Encounter for immunization: Secondary | ICD-10-CM

## 2023-04-04 ENCOUNTER — Ambulatory Visit (INDEPENDENT_AMBULATORY_CARE_PROVIDER_SITE_OTHER): Payer: PPO

## 2023-04-04 ENCOUNTER — Ambulatory Visit: Payer: PPO | Attending: Cardiology

## 2023-04-04 DIAGNOSIS — R0989 Other specified symptoms and signs involving the circulatory and respiratory systems: Secondary | ICD-10-CM

## 2023-04-04 DIAGNOSIS — I340 Nonrheumatic mitral (valve) insufficiency: Secondary | ICD-10-CM

## 2023-04-04 LAB — ECHOCARDIOGRAM COMPLETE
AR max vel: 2.6 cm2
AV Area VTI: 2.29 cm2
AV Area mean vel: 2.32 cm2
AV Mean grad: 4 mm[Hg]
AV Peak grad: 7.1 mm[Hg]
Ao pk vel: 1.33 m/s
Area-P 1/2: 3.72 cm2
Calc EF: 56.3 %
MV M vel: 5.81 m/s
MV Peak grad: 135 mm[Hg]
MV VTI: 1.33 cm2
P 1/2 time: 626 ms
S' Lateral: 3.2 cm
Single Plane A2C EF: 61.3 %
Single Plane A4C EF: 55.3 %

## 2023-04-11 ENCOUNTER — Encounter: Payer: Self-pay | Admitting: *Deleted

## 2023-05-26 ENCOUNTER — Telehealth: Payer: Self-pay | Admitting: *Deleted

## 2023-05-26 NOTE — Telephone Encounter (Signed)
-----   Message from Dina Rich sent at 05/21/2023  9:09 AM EST ----- Echo shows normal heart pumping function. Mitral valve with severe leak, no evidence of any enlargement of weakness of the heart, at last visit he was also asymptomatic. At this time would be just something to continue to monitor closely  Dominga Ferry MD

## 2023-05-26 NOTE — Telephone Encounter (Signed)
Notified son Barbara Cower), copy to pcp.

## 2023-05-28 ENCOUNTER — Other Ambulatory Visit: Payer: Self-pay | Admitting: Family Medicine

## 2023-05-28 DIAGNOSIS — M150 Primary generalized (osteo)arthritis: Secondary | ICD-10-CM

## 2023-05-29 NOTE — Telephone Encounter (Signed)
Next appt 07/07/23  Last OV 01/03/23  Please review for refills

## 2023-07-07 ENCOUNTER — Encounter: Payer: Self-pay | Admitting: Family Medicine

## 2023-07-07 ENCOUNTER — Ambulatory Visit (INDEPENDENT_AMBULATORY_CARE_PROVIDER_SITE_OTHER): Payer: PPO | Admitting: Family Medicine

## 2023-07-07 VITALS — BP 126/73 | HR 74 | Temp 98.7°F | Ht 70.0 in | Wt 182.0 lb

## 2023-07-07 DIAGNOSIS — I10 Essential (primary) hypertension: Secondary | ICD-10-CM | POA: Diagnosis not present

## 2023-07-07 DIAGNOSIS — K640 First degree hemorrhoids: Secondary | ICD-10-CM | POA: Diagnosis not present

## 2023-07-07 DIAGNOSIS — D539 Nutritional anemia, unspecified: Secondary | ICD-10-CM | POA: Diagnosis not present

## 2023-07-07 DIAGNOSIS — E781 Pure hyperglyceridemia: Secondary | ICD-10-CM | POA: Diagnosis not present

## 2023-07-07 DIAGNOSIS — Z85048 Personal history of other malignant neoplasm of rectum, rectosigmoid junction, and anus: Secondary | ICD-10-CM | POA: Diagnosis not present

## 2023-07-07 DIAGNOSIS — M792 Neuralgia and neuritis, unspecified: Secondary | ICD-10-CM | POA: Diagnosis not present

## 2023-07-07 LAB — CBC
Hematocrit: 34 % — ABNORMAL LOW (ref 37.5–51.0)
Hemoglobin: 11.6 g/dL — ABNORMAL LOW (ref 13.0–17.7)
MCH: 34.3 pg — ABNORMAL HIGH (ref 26.6–33.0)
MCHC: 34.1 g/dL (ref 31.5–35.7)
MCV: 101 fL — ABNORMAL HIGH (ref 79–97)
Platelets: 171 10*3/uL (ref 150–450)
RBC: 3.38 x10E6/uL — ABNORMAL LOW (ref 4.14–5.80)
RDW: 13 % (ref 11.6–15.4)
WBC: 7.2 10*3/uL (ref 3.4–10.8)

## 2023-07-07 MED ORDER — GABAPENTIN 300 MG PO CAPS: 300.0000 mg | ORAL_CAPSULE | Freq: Three times a day (TID) | ORAL | 3 refills | Status: DC | PRN

## 2023-07-07 MED ORDER — HYDROCHLOROTHIAZIDE 25 MG PO TABS: ORAL_TABLET | ORAL | 3 refills | Status: DC

## 2023-07-07 MED ORDER — PRAVASTATIN SODIUM 20 MG PO TABS: 20.0000 mg | ORAL_TABLET | Freq: Every day | ORAL | 3 refills | Status: DC

## 2023-07-07 MED ORDER — HYDROCORTISONE (PERIANAL) 2.5 % EX CREA: TOPICAL_CREAM | Freq: Two times a day (BID) | CUTANEOUS | 0 refills | Status: DC | PRN

## 2023-07-07 MED ORDER — LISINOPRIL 40 MG PO TABS: 40.0000 mg | ORAL_TABLET | Freq: Every day | ORAL | 3 refills | Status: DC

## 2023-07-07 NOTE — Patient Instructions (Signed)
Fasting Labs next visit with your physical.

## 2023-07-07 NOTE — Progress Notes (Signed)
Subjective: CC: Follow-up anemia PCP: Raliegh Ip, DO JXB:JYNWGN Tyler Diaz is a 78 y.o. male presenting to clinic today for:  1.  Anemia Patient denies any rectal bleeding, hematuria.  Occasionally gets some bleeding when he gets scratched by his chickens but otherwise has not anything of concern.  He is doing well.  He reports good tolerance of exercise and stays active with his show chickens.  Does not report any edema, dizziness  2.  Neuropathy He does report that his afternoon pill seems to wear off before 7:00 each day.  He takes 1 in the morning and 1 around lunchtime.  Wondering if he might be able to start a third dose.  Reports no excessive daytime sedation, dizziness or swelling with meds  3.  Hemorrhoids Would like to have an extra bottle of the Procto-Med on hand.  Does not report any active flareups currently but this has been helpful in the past   ROS: Per HPI  Allergies  Allergen Reactions   Shrimp [Shellfish Allergy] Hives, Nausea And Vomiting and Rash    "only shrimp" - crustaceans - blisters, n/v    Past Medical History:  Diagnosis Date   Anemia    hx of   Arthritis    hands/knees   Blood clot in vein    RT ARM WITH PICC LINE    C. difficile colitis 08/19/2013   Colon cancer (HCC) 07/18/12 bx   rectum=Invasive adenocarcinoma w/ extracellular mucin,polyp=benign   GERD (gastroesophageal reflux disease)    OCCASIONAL   Hearing loss    partial greater on left   Heart murmur    Hematochezia    recent   Herpes zoster - R flank - with severe pain 03/11/2013   History of shingles    Hyperlipidemia    Hypertension    Hypothyroidism as child   hx of   Ileostomy - diverting loop - s/p takedown 06/28/2013 01/14/2013   Mitral regurgitation due to partially flail posterior mitral valve leaflet    Nocturia    Numbness    TOES / FINGERS DUE TO CHEMO   Peripheral vascular disease (HCC)    neuropathy due to cancer treatment (fingers and toes)   Radiation     FOR COLON CANCER   Rectal cancer (HCC)    Rheumatic fever as child   hx   Sinusitis    seasonal   Staphylococcus aureus bacteremia 07/12/2019   Staphylococcus aureus sepsis (HCC) 07/17/2019   Tinnitus    left ear   Umbilical hernia, incarcerated - s/p primary repair July 2014 01/03/2013    Current Outpatient Medications:    acetaminophen (TYLENOL) 500 MG tablet, Take 1,000 mg by mouth every 6 (six) hours as needed for headache. , Disp: , Rfl:    clotrimazole-betamethasone (LOTRISONE) cream, Apply 1 application topically 2 (two) times daily. To neck for rash for up to 2 weeks., Disp: 30 g, Rfl: 0   diclofenac Sodium (VOLTAREN) 1 % GEL, APPLY 4 GRAMS TOPICALLY 4 TIMES DAILY FOR JOINT PAIN, Disp: 400 g, Rfl: 0   gabapentin (NEURONTIN) 300 MG capsule, Take 1 capsule (300 mg total) by mouth 2 (two) times daily as needed (nerve pain)., Disp: 180 capsule, Rfl: 3   hydrochlorothiazide (HYDRODIURIL) 25 MG tablet, TAKE 1 TABLET BY MOUTH ONCE DAILY STOP  CHLORTHALIDONE, Disp: 90 tablet, Rfl: 3   ketoconazole (NIZORAL) 2 % cream, Apply 1 application topically 2 (two) times daily., Disp: 60 g, Rfl: 1   lisinopril (ZESTRIL) 40  MG tablet, Take 1 tablet (40 mg total) by mouth daily., Disp: 90 tablet, Rfl: 3   Multiple Vitamin (MULTIVITAMIN WITH MINERALS) TABS, Take 1 tablet by mouth every morning. , Disp: , Rfl:    Olopatadine HCl 0.2 % SOLN, Instill 1 drop in each eye ONCE daily., Disp: 2.5 mL, Rfl: 12   oxymetazoline (AFRIN) 0.05 % nasal spray, Place 1 spray into both nostrils 2 (two) times daily as needed for congestion., Disp: , Rfl:    pravastatin (PRAVACHOL) 20 MG tablet, Take 1 tablet (20 mg total) by mouth daily., Disp: 90 tablet, Rfl: 3   Probiotic Product (DIGESTIVE ADVANTAGE PO), Take 1 tablet by mouth daily., Disp: , Rfl:    PROCTO-MED HC 2.5 % rectal cream, APPLY 1 APPLICATORFUL RECTALLY TWICE DAILY AS NEEDED FOR HEMORRHOIDS FOR RECTAL ITCHING FOR 7 TO 10 DAYS, Disp: 28 g, Rfl: 0   psyllium  (METAMUCIL) 58.6 % packet, Take 1 packet by mouth daily., Disp: , Rfl:    triamcinolone cream (KENALOG) 0.1 %, APPLY  CREAM TOPICALLY TO AFFECTED AREA THREE TIMES DAILY AS NEEDED, Disp: 80 g, Rfl: 1 Social History   Socioeconomic History   Marital status: Widowed    Spouse name: Not on file   Number of children: 2   Years of education: 9   Highest education level: 9th grade  Occupational History   Occupation: Retired    Associate Professor: Public librarian    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  Has two adult children and one grandchild. One son lives with him.    Social Drivers of Corporate investment banker Strain: Low Risk  (12/01/2022)   Overall Financial Resource Strain (CARDIA)    Difficulty of Paying Living Expenses: Not hard at all  Food Insecurity: No Food Insecurity (12/01/2022)   Hunger Vital Sign    Worried About Running Out of Food in the Last Year: Never true    Ran Out of Food in the Last Year: Never true  Transportation Needs: No Transportation Needs (12/01/2022)   PRAPARE - Administrator, Civil Service (Medical): No    Lack of Transportation (Non-Medical): No  Physical Activity: Insufficiently Active (12/01/2022)   Exercise Vital Sign    Days of Exercise per Week: 3 days    Minutes of Exercise per Session: 30 min  Stress: No Stress Concern Present (12/01/2022)   [employer] of Occupational Health - Occupational Stress Questionnaire    Feeling  of Stress : Not at all  Social Connections: Moderately Isolated (12/01/2022)   Social Connection and Isolation Panel [NHANES]    Frequency of Communication with Friends and Family: More than three times a week    Frequency of Social Gatherings with Friends and Family: More than three times a week    Attends Religious Services: 1 to 4 times per year    Active  Member of Clubs or Organizations: No    Attends Banker Meetings: Never    Marital Status: Widowed  Intimate Partner Violence: Not At Risk (12/01/2022)   Humiliation, Afraid, Rape, and Kick questionnaire    Fear of Current or Ex-Partner: No    Emotionally Abused: No    Physically Abused: No    Sexually Abused: No   Family History  Problem Relation Age of Onset   Cancer Mother        abdominal ca ?ovarian?   Heart disease Father    Cancer Sister        liver cancer   Heart disease Brother    COPD Brother    Healthy Son    Healthy Son     Objective: Office vital signs reviewed. BP 126/73   Pulse 74   Temp 98.7 F (37.1 C)   Ht 5\' 10"  (1.778 m)   Wt 182 lb (82.6 kg)   SpO2 99%   BMI 26.11 kg/m   Physical Examination:  General: Awake, alert, well nourished, No acute distress HEENT: sclera white, MMM.  No conjunctival pallor.  Oropharynx without masses Cardio: regular rate and rhythm, S1S2 heard, +systolic murmurs appreciated Pulm: clear to auscultation bilaterally, no wheezes, rhonchi or rales; normal work of breathing on room air MSK: Ambulating independently with normal gait and station  Assessment/ Plan: 78 y.o. male   Macrocytic anemia - Plan: CBC  Primary hypertension - Plan: hydrochlorothiazide (HYDRODIURIL) 25 MG tablet, lisinopril (ZESTRIL) 40 MG tablet  Neuropathic pain - Plan: gabapentin (NEURONTIN) 300 MG capsule  Hypertriglyceridemia - Plan: pravastatin (PRAVACHOL) 20 MG tablet  Grade I hemorrhoids - Plan: hydrocortisone (PROCTO-MED HC) 2.5 % rectal cream  History of colorectal  cancer - Plan: CBC, hydrocortisone (PROCTO-MED HC) 2.5 % rectal cream  Check CBC  Blood pressure well-controlled.  Medications have been renewed  I have advanced his gabapentin to 3 times daily dosing.  Did not discuss hypertriglyceridemia but pravastatin renewed.  Plan for fasting labs at next visit  I have renewed his Procto-Med.  Again CBC collected given history of anemia and colorectal cancer.  No red flag signs or symptoms  Raliegh Ip, DO [facility] [phone]

## 2023-08-03 ENCOUNTER — Encounter: Payer: Self-pay | Admitting: Cardiology

## 2023-08-03 ENCOUNTER — Ambulatory Visit: Payer: HMO | Attending: Cardiology | Admitting: Cardiology

## 2023-08-03 VITALS — BP 126/68 | HR 82 | Ht 70.0 in | Wt 180.2 lb

## 2023-08-03 DIAGNOSIS — I10 Essential (primary) hypertension: Secondary | ICD-10-CM | POA: Diagnosis not present

## 2023-08-03 DIAGNOSIS — E782 Mixed hyperlipidemia: Secondary | ICD-10-CM

## 2023-08-03 DIAGNOSIS — I340 Nonrheumatic mitral (valve) insufficiency: Secondary | ICD-10-CM

## 2023-08-03 NOTE — Progress Notes (Signed)
 Clinical Summary Mr. Geraci is a 78 y.o.male seen today for follow up of the following medical problems.      1. Mitral regurgitation - 11/2017 echo LVEF 60%, moderate to severe MR. Partial flail cusp of posterior leaflet. LVIDs 3.6 - 08/2013 TEE flail segment of P2 scallop posterior leaflet due to ruptured chordae, moderate to severe MR   03/2019 TTE There is posterior leaflet prolapse and  a calcified density on the atrial side of the posterior leaflet that could  represent a partial flail subsegment of the posterior leaflet or perhaps  an old vegetation. There is  eccentric, anteriorly directed, moderate to severe mitral valve  Regurgitation    07/2019 TEE: Ruptured chordae attached to the posterior leaflet with  partial prolapse of P2. Chronic finding when compared to 07/2013 TEE. There  is eccentric anterior directed MR thats difficult to quantify due to  eccentricity, appears moderate to  severe.    02/2021 echo: LVEF 60-65%, no WMAs, indet diastolic, normal RV, moderate MR, LVIDs 2.9  02/2022 echo: LVEF 60-65%, mod to severe MR, LVIDs 2.6  03/2023 echo: LVEF 60-65%, eccentric probably severe MR, LVIDs 3.2 - no recent SOB/DOE, no LE edema. Does heavy yard work without symptoms     2. HTN - he is compliant with meds - home bp's 120s/60s   3. Hyperlipidemia  - compliant with pravastatin - 11/2021 TC 139 TG 138 HDL 38 LDL 77 12/2022 TC 155 TG 138 HDL 41 LDL 90   4. Colorectal cancer - s/p surgery, currently cancer free.    Past Medical History:  Diagnosis Date   Anemia    hx of   Arthritis    hands/knees   Blood clot in vein    RT ARM WITH PICC LINE    C. difficile colitis 08/19/2013   Colon cancer (HCC) 07/18/12 bx   rectum=Invasive adenocarcinoma w/ extracellular mucin,polyp=benign   GERD (gastroesophageal reflux disease)    OCCASIONAL   Hearing loss    partial greater on left   Heart murmur    Hematochezia    recent   Herpes zoster - R flank - with  severe pain 03/11/2013   History of shingles    Hyperlipidemia    Hypertension    Hypothyroidism as child   hx of   Ileostomy - diverting loop - s/p takedown 06/28/2013 01/14/2013   Mitral regurgitation due to partially flail posterior mitral valve leaflet    Nocturia    Numbness    TOES / FINGERS DUE TO CHEMO   Peripheral vascular disease (HCC)    neuropathy due to cancer treatment (fingers and toes)   Radiation    FOR COLON CANCER   Rectal cancer (HCC)    Rheumatic fever as child   hx   Sinusitis    seasonal   Staphylococcus aureus bacteremia 07/12/2019   Staphylococcus aureus sepsis (HCC) 07/17/2019   Tinnitus    left ear   Umbilical hernia, incarcerated - s/p primary repair July 2014 01/03/2013     Allergies  Allergen Reactions   Shrimp [Shellfish Allergy] Hives, Nausea And Vomiting and Rash    "only shrimp" - crustaceans - blisters, n/v      Current Outpatient Medications  Medication Sig Dispense Refill   acetaminophen (TYLENOL) 500 MG tablet Take 1,000 mg by mouth every 6 (six) hours as needed for headache.      clotrimazole-betamethasone (LOTRISONE) cream Apply 1 application topically 2 (two) times daily. To neck for  rash for up to 2 weeks. 30 g 0   diclofenac Sodium (VOLTAREN) 1 % GEL APPLY 4 GRAMS TOPICALLY 4 TIMES DAILY FOR JOINT PAIN 400 g 0   gabapentin (NEURONTIN) 300 MG capsule Take 1 capsule (300 mg total) by mouth 3 (three) times daily as needed (nerve pain). 270 capsule 3   hydrochlorothiazide (HYDRODIURIL) 25 MG tablet TAKE 1 TABLET BY MOUTH ONCE DAILY STOP  CHLORTHALIDONE 90 tablet 3   hydrocortisone (PROCTO-MED HC) 2.5 % rectal cream Place rectally 2 (two) times daily as needed for hemorrhoids or anal itching. 28 g 0   ketoconazole (NIZORAL) 2 % cream Apply 1 application topically 2 (two) times daily. 60 g 1   lisinopril (ZESTRIL) 40 MG tablet Take 1 tablet (40 mg total) by mouth daily. 90 tablet 3   Multiple Vitamin (MULTIVITAMIN WITH MINERALS) TABS Take 1  tablet by mouth every morning.      Olopatadine HCl 0.2 % SOLN Instill 1 drop in each eye ONCE daily. 2.5 mL 12   oxymetazoline (AFRIN) 0.05 % nasal spray Place 1 spray into both nostrils 2 (two) times daily as needed for congestion.     pravastatin (PRAVACHOL) 20 MG tablet Take 1 tablet (20 mg total) by mouth daily. 90 tablet 3   Probiotic Product (DIGESTIVE ADVANTAGE PO) Take 1 tablet by mouth daily.     psyllium (METAMUCIL) 58.6 % packet Take 1 packet by mouth daily.     triamcinolone cream (KENALOG) 0.1 % APPLY  CREAM TOPICALLY TO AFFECTED AREA THREE TIMES DAILY AS NEEDED 80 g 1   No current facility-administered medications for this visit.     Past Surgical History:  Procedure Laterality Date   CARPAL TUNNEL RELEASE Right 2002   ILEOSTOMY N/A 12/27/2012   Procedure: DIVERTING LOOP ILEOSTOMY;  Surgeon: Ardeth Sportsman, MD;  Location: [facility];  Service: General;  Laterality: N/A;   ILEOSTOMY CLOSURE N/A 06/28/2013   Procedure: loop ILEOSTOMY TAKEDOWN examination of anesthesia ;  Surgeon: Ardeth Sportsman, MD;  Location: [facility];  Service: General;  Laterality: N/A;   INGUINAL HERNIA REPAIR Left    INGUINAL HERNIA REPAIR Bilateral 08/13/2015   Procedure: LAPAROSCOPIC BILATERAL INGUINAL HERNIA REPAIR WITH MESH;  Surgeon: Karie Soda, MD;  Location: [facility];  Service: General;  Laterality: Bilateral;   INSERTION OF MESH Bilateral 08/13/2015   Procedure: INSERTION OF MESH;  Surgeon: Karie Soda, MD;  Location: [facility];  Service: General;  Laterality: Bilateral;   LAPAROSCOPIC LOW ANTERIOR RESCECTION WITH COLOANAL ANASTOMOSIS N/A 12/27/2012   Procedure: LAPAROSCOPIC LOW ANTERIOR RESCECTION WITH COLOANAL ANASTOMOSIS;  Surgeon: Ardeth Sportsman, MD;  Location: [facility];  Service: General;  Laterality: N/A;   LAPAROSCOPIC LOW ANTERIOR RESECTION N/A 12/27/2012   Procedure: LAPAROSCOPIC LOW ANTERIOR RESECTION, COLO-ANAL ANASTOMOSIS, DIVERTING LOOP ILEOSTOMY,SPLENIC FLEXURE MOBILIZATION, PRIMARY INCARCERATED  UMBILICAL HERNIA REPAIR;  Surgeon: Ardeth Sportsman, MD;  Location: [facility];  Service: General;  Laterality: N/A;   TEE WITHOUT CARDIOVERSION N/A 07/10/2013   Procedure: TRANSESOPHAGEAL ECHOCARDIOGRAM (TEE);  Surgeon: Donato Schultz, MD;  Location: [facility] ENDOSCOPY;  Service: Cardiovascular;  Laterality: N/A;   TEE WITHOUT CARDIOVERSION N/A 08/21/2013   Procedure: TRANSESOPHAGEAL ECHOCARDIOGRAM (TEE);  Surgeon: Thurmon Fair, MD;  Location: [facility];  Service: Cardiovascular;  Laterality: N/A;   TEE WITHOUT CARDIOVERSION N/A 07/16/2019   Procedure: TRANSESOPHAGEAL ECHOCARDIOGRAM (TEE) WITH PROPOFOL;  Surgeon: Antoine Poche, MD;  Location: [facility];  Service: Endoscopy;  Laterality: N/A;   TONSILLECTOMY     as child  UMBILICAL HERNIA REPAIR  12/27/2012   Procedure: PRIMARY REPAIR INCARCERATED UMBILICAL HERNIA;  Surgeon: Ardeth Sportsman, MD;  Location: [facility];  Service: General;;     Allergies  Allergen Reactions   Shrimp [Shellfish Allergy] Hives, Nausea And Vomiting and Rash    "only shrimp" - crustaceans - blisters, n/v       Family History  Problem Relation Age of Onset   Cancer Mother        abdominal ca ?ovarian?   Heart disease Father    Cancer Sister        liver cancer   Heart disease Brother    COPD Brother    Healthy Son    Healthy Son      Social History Mr. Mroczkowski reports that he quit smoking about 51 years ago. His smoking use included cigarettes. He started smoking about 71 years ago. He has a 30 pack-year smoking history. He has never used smokeless tobacco. Mr. Lisenbee reports no history of alcohol use.   Marland Kitchen   Physical Examination Today's Vitals   08/03/23 0821  BP: 126/68  Pulse: 82  SpO2: 95%  Weight: 180 lb 3.2 oz (81.7 kg)  Height: 5\' 10"  (1.778 m)   Body mass index is 25.86 kg/m.  Gen: resting comfortably, no acute distress HEENT: no scleral icterus, pupils equal round and reactive, no palptable cervical adenopathy,  CV: RRR, 3/6 systolic murmur  apex, no jvd.  Resp: Clear to auscultation bilaterally GI: abdomen is soft, non-tender, non-distended, normal bowel sounds, no hepatosplenomegaly MSK: extremities are warm, no edema.  Skin: warm, no rash Neuro:  no focal deficits Psych: appropriate affect   Diagnostic Studies 03/2019 echo IMPRESSIONS     1. Left ventricular ejection fraction, by visual estimation, is 55 to  60%. The left ventricle has normal function. Normal left ventricular size.  There is mildly increased left ventricular hypertrophy.   2. Global right ventricle has normal systolic function.The right  ventricular size is normal. No increase in right ventricular wall  thickness.   3. Left atrial size was mildly dilated.   4. Right atrial size was normal.   5. Mild aortic valve annular calcification.   6. The mitral valve is abnormal. There is posterior leaflet prolapse and  a calcified density on the atrial side of the posterior leaflet that could  represent a partial flail subsegment of the posterior leaflet or perhaps  an old vegetation. There is  eccentric, anteriorly directed, moderate to severe mitral valve  regurgitation. PISA calculations are challenging with very eccentric jet,  but one image suggests MR radius is at least 1 cm which is consistent with  severe regurgitation. Suggest TEE for  further evaluation if clinically indicated.   7. The tricuspid valve is grossly normal. Tricuspid valve regurgitation  is mild.   8. The aortic valve is tricuspid Aortic valve regurgitation is trivial by  color flow Doppler.   9. The pulmonic valve was grossly normal. Pulmonic valve regurgitation is  mild by color flow Doppler.  10. Normal pulmonary artery systolic pressure.  11. The tricuspid regurgitant velocity is 2.36 m/s, and with an assumed  right atrial pressure of 3 mmHg, the estimated right ventricular systolic  pressure is normal at 25.3 mmHg.  12. The inferior vena cava is normal in size with greater  than 50%  respiratory variability, suggesting right atrial pressure of 3 mmHg.   In comparison to the previous echocardiogram(s): Previous Echo [facility]  showed LV EF 60%,  mild LAE, moderate to severe MR with a wall-impinging MR  jet and a flail cusp of the posterior leaflet. Mild MR, PI and dilated  IVC. These images were not able  to be viewed directly.  FINDINGS   Left Ventricle: Left ventricular ejection fraction, by visual estimation,  is 55 to 60%. The left ventricle has normal function. There is mildly  increased left ventricular hypertrophy. Normal left ventricular size.  Spectral Doppler shows Left ventricular   diastolic parameters were normal pattern of LV diastolic filling.      07/2019 TEE IMPRESSIONS     1. Left ventricular ejection fraction, by estimation, is 60 to 65%. The  left ventricle has normal function. The left ventrical has no regional  wall motion abnormalities. Left ventricular diastolic function could not  be evaluated.   2. Right ventricular systolic function is normal. The right ventricular  size is normal.   3. Left atrial size was moderately dilated. No left atrial/left atrial  appendage thrombus was detected.   4. Right atrial size was moderately dilated.   5. Ruptured chordae attached to the posterior leaflet with partial  prolapse of P2. Chronic finding when compared to 07/2013 TEE. There is  eccentric anterior directed MR thats difficult to quantify due to  eccentricity, appears moderate to severe. . The  mitral valve is abnormal. Moderate to severe MR mitral valve  regurgitation. No evidence of mitral stenosis.   6. Tricuspid valve regurgitation is mild to moderate.   7. The aortic valve is tricuspid. Aortic valve regurgitation is trivial .  No aortic stenosis is present.     02/2021 echo IMPRESSIONS     1. Left ventricular ejection fraction, by estimation, is 60 to 65%. The  left ventricle has normal function. The left ventricle has no  regional  wall motion abnormalities. Left ventricular diastolic parameters are  indeterminate.   2. Right ventricular systolic function is normal. The right ventricular  size is normal. There is mildly elevated pulmonary artery systolic  pressure.   3. Left atrial size was mildly dilated.   4. There is prolapse of a portion of the posterior MV leaflet with  resultant eccentric anterior MR. The MR VC is 0.6 cm. The MV/AV VTI ratio  is 1.3 supporting moderate MR. . The mitral valve is abnormal. Moderate  mitral valve regurgitation. No evidence  of mitral stenosis.   5. The aortic valve is tricuspid. Aortic valve regurgitation is mild. No  aortic stenosis is present.   6. The inferior vena cava is normal in size with greater than 50%  respiratory variability, suggesting right atrial pressure of 3 mmHg.      02/2022 echo IMPRESSIONS     1. Left ventricular ejection fraction, by estimation, is 60 to 65%. The  left ventricle has normal function. The left ventricle has no regional  wall motion abnormalities. There is mild left ventricular hypertrophy.  Left ventricular diastolic parameters  were normal. The average left ventricular global longitudinal strain is  -23.6 %. The global longitudinal strain is normal.   2. Right ventricular systolic function is normal. The right ventricular  size is normal. There is normal pulmonary artery systolic pressure.   3. Left atrial size was moderately dilated.   4. Prolapse of a portion of the posterior MV leaflet with eccentric  anterior MR. The eccentricity limits ability to quantify severity. MV/AV  VTI ratio 1.5 suggesting severe MR. Visually MR looks to be moderate to  severe. . The mitral valve  is abnormal.  Moderate to severe mitral valve regurgitation. No evidence of mitral  stenosis.   5. The tricuspid valve is abnormal.   6. The aortic valve is tricuspid. There is mild calcification of the  aortic valve. There is mild thickening of the  aortic valve. Aortic valve  regurgitation is not visualized. No aortic stenosis is present.   7. The inferior vena cava is normal in size with greater than 50%  respiratory variability, suggesting right atrial pressure of 3 mmHg.   03/2023 echo 1. Left ventricular ejection fraction, by estimation, is 60 to 65%. The  left ventricle has normal function. The left ventricle has no regional  wall motion abnormalities. There is mild left ventricular hypertrophy.  Left ventricular diastolic parameters  were normal. The average left ventricular global longitudinal strain is  -24.3 %. The global longitudinal strain is normal.   2. Right ventricular systolic function is normal. The right ventricular  size is normal. There is normal pulmonary artery systolic pressure.   3. Left atrial size was severely dilated.   4. Partial prolapse of a portion of the posterior MV leaflet with  eccentric anterior MR. The eccentricity of the jet makes quantification  more difficult. MV to AV VTI ratio is 1.7 suggesting severe MR. . The  mitral valve is abnormal. Severe mitral valve   regurgitation. No evidence of mitral stenosis.   5. The tricuspid valve is abnormal.   6. The aortic valve is tricuspid. Aortic valve regurgitation is not  visualized. No aortic stenosis is present.   7. The inferior vena cava is normal in size with greater than 50%  respiratory variability, suggesting right atrial pressure of 3 mmHg.   03/2023 carotid US Summary:  Right Carotid: Velocities in the right ICA are consistent with a 1-39%  stenosis.                Non-hemodynamically significant plaque <50% noted in the  CCA. The                 ECA appears <50% stenosed.   Left Carotid: Velocities in the left ICA are consistent with a 1-39%  stenosis.               Non-hemodynamically significant plaque <50% noted in the  CCA. The                ECA appears <50% stenosed.   Vertebrals:  Bilateral vertebral arteries demonstrate  antegrade flow.  Subclavians: Normal flow hemodynamics were seen in bilateral subclavian               arteries.   Assessment and Plan  1. Mitral regurgitation - moderate to severe by echos over time without evidence of LV dysfunction or enlargement - continues to be asymptomatic. - continue to monitor at this time, would repeat echo later this year in the fall     2. HTN - he prefers HCTZ instead of chlorthaldone due to cost - has white coat HTN often elevated in clinic. - bp is at goal, continue current meds   3. Hyperlipidemia - his LDL is at goal, continue current meds   F/u 6 months   Antoine Poche, M.D.

## 2023-08-03 NOTE — Patient Instructions (Signed)
 Medication Instructions:  Continue all current medications.   Labwork: none  Testing/Procedures: none  Follow-Up: 6 months   Any Other Special Instructions Will Be Listed Below (If Applicable).   If you need a refill on your cardiac medications before your next appointment, please call your pharmacy.

## 2023-09-14 ENCOUNTER — Other Ambulatory Visit: Payer: Self-pay | Admitting: Family Medicine

## 2023-09-14 DIAGNOSIS — K640 First degree hemorrhoids: Secondary | ICD-10-CM

## 2023-09-14 DIAGNOSIS — Z85048 Personal history of other malignant neoplasm of rectum, rectosigmoid junction, and anus: Secondary | ICD-10-CM

## 2023-11-03 ENCOUNTER — Encounter: Payer: Self-pay | Admitting: Family Medicine

## 2023-11-03 ENCOUNTER — Ambulatory Visit: Admitting: Family Medicine

## 2023-11-03 ENCOUNTER — Telehealth: Payer: Self-pay

## 2023-11-03 VITALS — BP 125/75 | HR 73 | Temp 98.2°F | Ht 70.0 in | Wt 175.0 lb

## 2023-11-03 DIAGNOSIS — M5416 Radiculopathy, lumbar region: Secondary | ICD-10-CM

## 2023-11-03 MED ORDER — METHYLPREDNISOLONE ACETATE 40 MG/ML IJ SUSP
40.0000 mg | Freq: Once | INTRAMUSCULAR | Status: AC
  Administered 2023-11-03: 40 mg via INTRAMUSCULAR

## 2023-11-03 MED ORDER — PREDNISONE 10 MG (21) PO TBPK: ORAL_TABLET | ORAL | 0 refills | Status: DC

## 2023-11-03 MED ORDER — LIDOCAINE 5 % EX PTCH: 1.0000 | MEDICATED_PATCH | Freq: Every day | CUTANEOUS | 0 refills | Status: DC | PRN

## 2023-11-03 NOTE — Telephone Encounter (Signed)
 Pharmacy Patient Advocate Encounter   Received notification from CoverMyMeds that prior authorization for Lidocaine  5% patches is required/requested.   Insurance verification completed.   The patient is insured through [facility] ADVANTAGE/RX ADVANCE .   Per test claim: PA required; PA started via CoverMyMeds. KEY [id number] . Waiting for clinical questions to populate.

## 2023-11-03 NOTE — Progress Notes (Signed)
 Subjective: CC: Low back pain PCP: Eliodoro Guerin, DO UEA:[id number] Tyler Diaz is a 78 y.o. male presenting to clinic today for:  1.  Low back pain Patient reports 1 week history of right-sided low back pain that radiates to the right anterior leg to approximately mid thigh.  He denies any preceding injury but does report he has had a few episodes of near falls where he slipped in muddy areas around his chicken coop.  He did try and catch himself and this did not result in a fall.  He has been utilizing Tylenol  which he uses daily anyways.  However, this has not improved his symptomology.  He denies any sensory changes or weakness of the leg.  He does report that certain positions make it quite severe including standing or walking for prolonged amount of time.   ROS: Per HPI  Allergies  Allergen Reactions   Shrimp [Shellfish Allergy] Hives, Nausea And Vomiting and Rash    "only shrimp" - crustaceans - blisters, n/v    Past Medical History:  Diagnosis Date   Anemia    hx of   Arthritis    hands/knees   Blood clot in vein    RT ARM WITH PICC LINE    C. difficile colitis 08/19/2013   Colon cancer (HCC) 07/18/12 bx   rectum=Invasive adenocarcinoma w/ extracellular mucin,polyp=benign   GERD (gastroesophageal reflux disease)    OCCASIONAL   Hearing loss    partial greater on left   Heart murmur    Hematochezia    recent   Herpes zoster - R flank - with severe pain 03/11/2013   History of shingles    Hyperlipidemia    Hypertension    Hypothyroidism as child   hx of   Ileostomy - diverting loop - s/p takedown 06/28/2013 01/14/2013   Mitral regurgitation due to partially flail posterior mitral valve leaflet    Nocturia    Numbness    TOES / FINGERS DUE TO CHEMO   Peripheral vascular disease (HCC)    neuropathy due to cancer treatment (fingers and toes)   Radiation    FOR COLON CANCER   Rectal cancer (HCC)    Rheumatic fever as child   hx   Sinusitis    seasonal    Staphylococcus aureus bacteremia 07/12/2019   Staphylococcus aureus sepsis (HCC) 07/17/2019   Tinnitus    left ear   Umbilical hernia, incarcerated - s/p primary repair July 2014 01/03/2013    Current Outpatient Medications:    acetaminophen  (TYLENOL ) 500 MG tablet, Take 1,000 mg by mouth every 6 (six) hours as needed for headache. , Disp: , Rfl:    clotrimazole -betamethasone  (LOTRISONE ) cream, Apply 1 application topically 2 (two) times daily. To neck for rash for up to 2 weeks., Disp: 30 g, Rfl: 0   diclofenac  Sodium (VOLTAREN ) 1 % GEL, APPLY 4 GRAMS TOPICALLY 4 TIMES DAILY FOR JOINT PAIN, Disp: 400 g, Rfl: 0   gabapentin  (NEURONTIN ) 300 MG capsule, Take 1 capsule (300 mg total) by mouth 3 (three) times daily as needed (nerve pain)., Disp: 270 capsule, Rfl: 3   hydrochlorothiazide  (HYDRODIURIL ) 25 MG tablet, TAKE 1 TABLET BY MOUTH ONCE DAILY STOP  CHLORTHALIDONE , Disp: 90 tablet, Rfl: 3   hydrocortisone  (PROCTO-MED HC ) 2.5 % rectal cream, Place rectally 2 (two) times daily as needed for hemorrhoids or anal itching. For no more than 3-5 days per flareup, Disp: 28 g, Rfl: 0   ketoconazole  (NIZORAL ) 2 % cream, Apply 1  application topically 2 (two) times daily., Disp: 60 g, Rfl: 1   lisinopril  (ZESTRIL ) 40 MG tablet, Take 1 tablet (40 mg total) by mouth daily., Disp: 90 tablet, Rfl: 3   Multiple Vitamin (MULTIVITAMIN WITH MINERALS) TABS, Take 1 tablet by mouth every morning. , Disp: , Rfl:    Olopatadine  HCl 0.2 % SOLN, Instill 1 drop in each eye ONCE daily., Disp: 2.5 mL, Rfl: 12   oxymetazoline  (AFRIN) 0.05 % nasal spray, Place 1 spray into both nostrils 2 (two) times daily as needed for congestion., Disp: , Rfl:    pravastatin  (PRAVACHOL ) 20 MG tablet, Take 1 tablet (20 mg total) by mouth daily., Disp: 90 tablet, Rfl: 3   Probiotic Product (DIGESTIVE ADVANTAGE PO), Take 1 tablet by mouth daily., Disp: , Rfl:    psyllium (METAMUCIL) 58.6 % packet, Take 1 packet by mouth daily., Disp: , Rfl:     triamcinolone  cream (KENALOG ) 0.1 %, APPLY  CREAM TOPICALLY TO AFFECTED AREA THREE TIMES DAILY AS NEEDED, Disp: 80 g, Rfl: 1 Social History   Socioeconomic History   Marital status: Widowed    Spouse name: Not on file   Number of children: 2   Years of education: 9   Highest education level: 9th grade  Occupational History   Occupation: Retired    Associate Professor: Public librarian    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  Has two adult children and one grandchild. One son lives with him.    Social Drivers of Corporate investment banker Strain: Low Risk  (12/01/2022)   Overall Financial Resource Strain (CARDIA)    Difficulty of Paying Living Expenses: Not hard at all  Food Insecurity: No Food Insecurity (12/01/2022)   Hunger Vital Sign    Worried About Running Out of Food in the Last Year: Never true    Ran Out of Food in the Last Year: Never true  Transportation Needs: No Transportation Needs (12/01/2022)   PRAPARE - Administrator, Civil Service (Medical): No    Lack of Transportation (Non-Medical): No  Physical Activity: Insufficiently Active (12/01/2022)   Exercise Vital Sign    Days of Exercise per Week: 3 days    Minutes of Exercise per Session: 30 min  Stress: No Stress Concern Present (12/01/2022)   [employer] of Occupational Health - Occupational Stress Questionnaire    Feeling of Stress : Not at all  Social Connections: Moderately Isolated (12/01/2022)    Social Connection and Isolation Panel [NHANES]    Frequency of Communication with Friends and Family: More than three times a week    Frequency of Social Gatherings with Friends and Family: More than three times a week    Attends Religious Services: 1 to 4 times per year    Active Member of [employer] or Organizations: No    Attends Banker Meetings: Never  Marital Status: Widowed  Intimate Partner Violence: Not At Risk (12/01/2022)   Humiliation, Afraid, Rape, and Kick questionnaire    Fear of Current or Ex-Partner: No    Emotionally Abused: No    Physically Abused: No    Sexually Abused: No   Family History  Problem Relation Age of Onset   Cancer Mother        abdominal ca ?ovarian?   Heart disease Father    Cancer Sister        liver cancer   Heart disease Brother    COPD Brother    Healthy Son    Healthy Son     Objective: Office vital signs reviewed. BP 125/75   Pulse 73   Temp 98.2 F (36.8 C)   Ht 5\' 10"  (1.778 m)   Wt 175 lb (79.4 kg)   SpO2 98%   BMI 25.11 kg/m   Physical Examination:  General: Awake, alert, well nourished, No acute distress MSK: Ambulating independently but has a hunched station.  Lumbar spine without any midline tenderness palpation.  He has a slight scoliotic curve but the thoracolumbar junction.  He has negative straight leg raise. 4/5 flexor strength but otherwise 5/5 strength in lower extremities bilaterally.  Light touch and station grossly intact.  Negative FADIR.  Negative FABER  Assessment/ Plan: 78 y.o. male   Acute right lumbar radiculopathy - Plan: methylPREDNISolone  acetate (DEPO-MEDROL ) injection 40 mg, predniSONE (STERAPRED UNI-PAK 21 TAB) 10 MG (21) TBPK tablet, lidocaine  (LIDODERM ) 5 %  Suspect that he has acute on chronic issues.  I reviewed his CT abdomen pelvis from 2021 which demonstrated degenerative changes of the thoracolumbar spine at that time.  I think holding off on x-ray is fair given the lack of  injury.  I am going to treat him with intramuscular corticosteroid and he can follow-up with oral corticosteroid starting tomorrow.  Okay to continue Tylenol  if needed.  Handout with home physical therapy exercises provided and he can utilize topical lidocaine  patches at the site of pain if needed.  I will reevaluate him in 1 week and if symptoms are persistent at that time we will plan for plain films and referral to physical therapy and/or orthopedics.  He was in agreement of plan will follow-up as directed   Eliodoro Guerin, DO [facility] [phone]

## 2023-11-07 NOTE — Telephone Encounter (Signed)
 Clinical questions were answered on 11/03/2023. Will update once determination is made.

## 2023-11-07 NOTE — Telephone Encounter (Signed)
 Pharmacy Patient Advocate Encounter  Received notification from [facility] [employer] that Prior Authorization for Lidocaine  5% patches has been DENIED.  Full denial letter will be uploaded to the media tab. See denial reason below.   PA #/Case ID/Reference #:  [id number]

## 2023-11-10 ENCOUNTER — Encounter: Payer: Self-pay | Admitting: Family Medicine

## 2023-11-10 ENCOUNTER — Ambulatory Visit (INDEPENDENT_AMBULATORY_CARE_PROVIDER_SITE_OTHER)

## 2023-11-10 ENCOUNTER — Ambulatory Visit: Admitting: Family Medicine

## 2023-11-10 VITALS — BP 137/70 | HR 75 | Temp 98.5°F | Ht 70.0 in | Wt 176.8 lb

## 2023-11-10 DIAGNOSIS — M5416 Radiculopathy, lumbar region: Secondary | ICD-10-CM | POA: Diagnosis not present

## 2023-11-10 DIAGNOSIS — M48061 Spinal stenosis, lumbar region without neurogenic claudication: Secondary | ICD-10-CM | POA: Diagnosis not present

## 2023-11-10 DIAGNOSIS — M4316 Spondylolisthesis, lumbar region: Secondary | ICD-10-CM | POA: Diagnosis not present

## 2023-11-10 DIAGNOSIS — M545 Low back pain, unspecified: Secondary | ICD-10-CM

## 2023-11-10 DIAGNOSIS — N289 Disorder of kidney and ureter, unspecified: Secondary | ICD-10-CM

## 2023-11-10 DIAGNOSIS — M47816 Spondylosis without myelopathy or radiculopathy, lumbar region: Secondary | ICD-10-CM | POA: Diagnosis not present

## 2023-11-10 MED ORDER — TIZANIDINE HCL 2 MG PO TABS
2.0000 mg | ORAL_TABLET | Freq: Every day | ORAL | 0 refills | Status: AC | PRN
Start: 2023-11-10 — End: ?

## 2023-11-10 NOTE — Progress Notes (Signed)
 Subjective:  Patient ID: Tyler Diaz, male    DOB: 04/25/1946, 78 y.o.   MRN: [id number]  Patient Care Team: Eliodoro Guerin, DO as PCP - General (Family Medicine) Amanda Jungling Joyceann No, MD as PCP - Cardiology (Cardiology) Sumner Ends, MD as Consulting Physician (Oncology) Hyland Mailman, MD as Referring Physician (Optometry)   Chief Complaint:  Back Pain (Right side/X 1  week, not getting better)   HPI: Tyler Diaz is a 78 y.o. male presenting on 11/10/2023 for Back Pain (Right side/X 1  week, not getting better)  Back Pain   1. Acute right-sided low back pain, unspecified whether sciatica present He states that he has pain along his right back and hip. Patient was seen one week ago for the same issue by PCP. He was provided IM corticosteroid injection and oral burst. He completed burst. Does not feel that it helped much. He is using lidocaine  patches as well. Reports that the pain increases with standing up, improves with bending forward.  States that this morning it was so bad he had to crawl to the other room to get on the heating pad. States that he is still stretching and not sure if the stretching made it worse.  He states that he is unsure of when initial pain started as he is active in his yard with chicken coop. Reports that he has tingling that radiates down his right leg and anterior thigh. Denies numbness. States that walking helps the pain. Denies incontinence.    Relevant past medical, surgical, family, and social history reviewed and updated as indicated.  Allergies and medications reviewed and updated. Data reviewed: Chart in Epic.   Past Medical History:  Diagnosis Date   Anemia    hx of   Arthritis    hands/knees   Blood clot in vein    RT ARM WITH PICC LINE    C. difficile colitis 08/19/2013   Colon cancer (HCC) 07/18/12 bx   rectum=Invasive adenocarcinoma w/ extracellular mucin,polyp=benign   GERD (gastroesophageal reflux disease)    OCCASIONAL    Hearing loss    partial greater on left   Heart murmur    Hematochezia    recent   Herpes zoster - R flank - with severe pain 03/11/2013   History of shingles    Hyperlipidemia    Hypertension    Hypothyroidism as child   hx of   Ileostomy - diverting loop - s/p takedown 06/28/2013 01/14/2013   Mitral regurgitation due to partially flail posterior mitral valve leaflet    Nocturia    Numbness    TOES / FINGERS DUE TO CHEMO   Peripheral vascular disease (HCC)    neuropathy due to cancer treatment (fingers and toes)   Radiation    FOR COLON CANCER   Rectal cancer (HCC)    Rheumatic fever as child   hx   Sinusitis    seasonal   Staphylococcus aureus bacteremia 07/12/2019   Staphylococcus aureus sepsis (HCC) 07/17/2019   Tinnitus    left ear   Umbilical hernia, incarcerated - s/p primary repair July 2014 01/03/2013    Past Surgical History:  Procedure Laterality Date   CARPAL TUNNEL RELEASE Right 2002   ILEOSTOMY N/A 12/27/2012   Procedure: DIVERTING LOOP ILEOSTOMY;  Surgeon: Eddye Goodie, MD;  Location: [facility];  Service: General;  Laterality: N/A;   ILEOSTOMY CLOSURE N/A 06/28/2013   Procedure: loop ILEOSTOMY TAKEDOWN examination of anesthesia ;  Surgeon: Eddye Goodie, MD;  Location: [facility];  Service: General;  Laterality: N/A;   INGUINAL HERNIA REPAIR Left    INGUINAL HERNIA REPAIR Bilateral 08/13/2015   Procedure: LAPAROSCOPIC BILATERAL INGUINAL HERNIA REPAIR WITH MESH;  Surgeon: Candyce Champagne, MD;  Location: [facility];  Service: General;  Laterality: Bilateral;   INSERTION OF MESH Bilateral 08/13/2015   Procedure: INSERTION OF MESH;  Surgeon: Candyce Champagne, MD;  Location: [facility];  Service: General;  Laterality: Bilateral;   LAPAROSCOPIC LOW ANTERIOR RESCECTION WITH COLOANAL ANASTOMOSIS N/A 12/27/2012   Procedure: LAPAROSCOPIC LOW ANTERIOR RESCECTION WITH COLOANAL ANASTOMOSIS;  Surgeon: Eddye Goodie, MD;  Location: [facility];  Service: General;  Laterality: N/A;   LAPAROSCOPIC LOW  ANTERIOR RESECTION N/A 12/27/2012   Procedure: LAPAROSCOPIC LOW ANTERIOR RESECTION, COLO-ANAL ANASTOMOSIS, DIVERTING LOOP ILEOSTOMY,SPLENIC FLEXURE MOBILIZATION, PRIMARY INCARCERATED UMBILICAL HERNIA REPAIR;  Surgeon: Eddye Goodie, MD;  Location: [facility];  Service: General;  Laterality: N/A;   TEE WITHOUT CARDIOVERSION N/A 07/10/2013   Procedure: TRANSESOPHAGEAL ECHOCARDIOGRAM (TEE);  Surgeon: Dorothye Gathers, MD;  Location: [facility] ENDOSCOPY;  Service: Cardiovascular;  Laterality: N/A;   TEE WITHOUT CARDIOVERSION N/A 08/21/2013   Procedure: TRANSESOPHAGEAL ECHOCARDIOGRAM (TEE);  Surgeon: Luana Rumple, MD;  Location: [facility] ENDOSCOPY;  Service: Cardiovascular;  Laterality: N/A;   TEE WITHOUT CARDIOVERSION N/A 07/16/2019   Procedure: TRANSESOPHAGEAL ECHOCARDIOGRAM (TEE) WITH PROPOFOL ;  Surgeon: Laurann Pollock, MD;  Location: [facility];  Service: Endoscopy;  Laterality: N/A;   TONSILLECTOMY     as child   UMBILICAL HERNIA REPAIR  12/27/2012   Procedure: PRIMARY REPAIR INCARCERATED UMBILICAL HERNIA;  Surgeon: Eddye Goodie, MD;  Location: [facility];  Service: General;;    Social History   Socioeconomic History   Marital status: Widowed    Spouse name: Not on file   Number of children: 2   Years of education: 9   Highest education level: 9th grade  Occupational History   Occupation: Retired    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  Has two adult children and one  grandchild. One son lives with him.    Social Drivers of Corporate investment banker Strain: Low Risk  (12/01/2022)   Overall Financial Resource Strain (CARDIA)    Difficulty of Paying Living Expenses: Not hard at all  Food Insecurity: No Food Insecurity (12/01/2022)   Hunger Vital Sign    Worried About Running Out of Food in the Last Year: Never true    Ran Out of Food in the Last Year: Never true  Transportation Needs: No Transportation Needs (12/01/2022)   PRAPARE - Administrator, Civil Service (Medical): No    Lack of Transportation (Non-Medical): No  Physical Activity: Insufficiently Active (12/01/2022)   Exercise Vital Sign    Days of Exercise per Week: 3 days    Minutes  of Exercise per Session: 30 min  Stress: No Stress Concern Present (12/01/2022)   [employer] of Occupational Health - Occupational Stress Questionnaire    Feeling of Stress : Not at all  Social Connections: Moderately Isolated (12/01/2022)   Social Connection and Isolation Panel [NHANES]    Frequency of Communication with Friends and Family: More than three times a week    Frequency of Social Gatherings with Friends and Family: More than three times a week    Attends Religious Services: 1 to 4 times per year    Active Member of [employer] or Organizations: No    Attends Banker Meetings: Never    Marital Status: Widowed  Intimate Partner Violence: Not At Risk (12/01/2022)   Humiliation, Afraid, Rape, and Kick questionnaire    Fear of Current or Ex-Partner: No    Emotionally Abused: No    Physically Abused: No    Sexually Abused: No   Outpatient Encounter Medications as of 11/10/2023  Medication Sig   acetaminophen  (TYLENOL ) 500 MG tablet Take 1,000 mg by mouth every 6 (six) hours as needed for headache.    clotrimazole -betamethasone  (LOTRISONE ) cream Apply 1 application topically 2 (two) times daily. To neck for rash for up to 2 weeks.   diclofenac  Sodium (VOLTAREN ) 1 % GEL APPLY 4  GRAMS TOPICALLY 4 TIMES DAILY FOR JOINT PAIN   gabapentin  (NEURONTIN ) 300 MG capsule Take 1 capsule (300 mg total) by mouth 3 (three) times daily as needed (nerve pain).   hydrochlorothiazide  (HYDRODIURIL ) 25 MG tablet TAKE 1 TABLET BY MOUTH ONCE DAILY STOP  CHLORTHALIDONE    hydrocortisone  (PROCTO-MED HC ) 2.5 % rectal cream Place rectally 2 (two) times daily as needed for hemorrhoids or anal itching. For no more than 3-5 days per flareup   ketoconazole  (NIZORAL ) 2 % cream Apply 1 application topically 2 (two) times daily.   lidocaine  (LIDODERM ) 5 % Place 1 patch onto the skin daily as needed (pain). Remove & Discard patch within 12 hours or as directed by MD. IF TOO EXPENSIVE PLEASE GRAB 4% patch OTC for him   lisinopril  (ZESTRIL ) 40 MG tablet Take 1 tablet (40 mg total) by mouth daily.   Multiple Vitamin (MULTIVITAMIN WITH MINERALS) TABS Take 1 tablet by mouth every morning.    Olopatadine  HCl 0.2 % SOLN Instill 1 drop in each eye ONCE daily.   oxymetazoline  (AFRIN) 0.05 % nasal spray Place 1 spray into both nostrils 2 (two) times daily as needed for congestion.   pravastatin  (PRAVACHOL ) 20 MG tablet Take 1 tablet (20 mg total) by mouth daily.   Probiotic Product (DIGESTIVE ADVANTAGE PO) Take 1 tablet by mouth daily.   psyllium (METAMUCIL) 58.6 % packet Take 1 packet by mouth daily.   triamcinolone  cream (KENALOG ) 0.1 % APPLY  CREAM TOPICALLY TO AFFECTED AREA THREE TIMES DAILY AS NEEDED   [DISCONTINUED] predniSONE  (STERAPRED UNI-PAK 21 TAB) 10 MG (21) TBPK tablet Start 11/04/23. Take 60mg  by mouth day 1, 50mg  day 2, 40mg  day 3, 30mg  day 4, 20mg  day 5, 10mg  day 6.  Then stop.   No facility-administered encounter medications on file as of 11/10/2023.    Allergies  Allergen Reactions   Shrimp [Shellfish Allergy] Hives, Nausea And Vomiting and Rash    "only shrimp" - crustaceans - blisters, n/v     Review of Systems  Musculoskeletal:  Positive for back pain.   As per HPI  Objective:  BP  137/70   Pulse 75   Temp 98.5 F (36.9  C)   Ht 5\' 10"  (1.778 m)   Wt 176 lb 12.8 oz (80.2 kg)   SpO2 97%   BMI 25.37 kg/m    Wt Readings from Last 3 Encounters:  11/10/23 176 lb 12.8 oz (80.2 kg)  11/03/23 175 lb (79.4 kg)  08/03/23 180 lb 3.2 oz (81.7 kg)   Physical Exam Constitutional:      General: He is awake. He is not in acute distress.    Appearance: Normal appearance. He is well-developed and well-groomed. He is not ill-appearing, toxic-appearing or diaphoretic.  Cardiovascular:     Rate and Rhythm: Normal rate and regular rhythm.     Heart sounds: Murmur heard.     No gallop.  Pulmonary:     Effort: Pulmonary effort is normal. No respiratory distress.     Breath sounds: Normal breath sounds. No stridor. No wheezing, rhonchi or rales.  Musculoskeletal:     Cervical back: Full passive range of motion without pain and neck supple.     Lumbar back: No swelling, edema, deformity, signs of trauma, lacerations, spasms, tenderness or bony tenderness. Normal range of motion. Scoliosis present.     Right hip: Tenderness present. No bony tenderness or crepitus. Decreased range of motion. Normal strength.     Comments: Slow and steady gait with hunched station  Slight tenderness on palpation of right hip   Skin:    General: Skin is warm.     Capillary Refill: Capillary refill takes less than 2 seconds.  Neurological:     General: No focal deficit present.     Mental Status: He is alert, oriented to person, place, and time and easily aroused. Mental status is at baseline.     GCS: GCS eye subscore is 4. GCS verbal subscore is 5. GCS motor subscore is 6.     Motor: No weakness.  Psychiatric:        Attention and Perception: Attention and perception normal.        Mood and Affect: Mood and affect normal.        Speech: Speech normal.        Behavior: Behavior normal. Behavior is cooperative.        Thought Content: Thought content normal. Thought content does not include  homicidal or suicidal ideation. Thought content does not include homicidal or suicidal plan.        Cognition and Memory: Cognition and memory normal.        Judgment: Judgment normal.     Results for orders placed or performed in visit on 07/07/23  CBC   Collection Time: 07/07/23  8:29 AM  Result Value Ref Range   WBC 7.2 3.4 - 10.8 x10E3/uL   RBC 3.38 (L) 4.14 - 5.80 x10E6/uL   Hemoglobin 11.6 (L) 13.0 - 17.7 g/dL   Hematocrit 13.0 (L) 86.5 - 51.0 %   MCV 101 (H) 79 - 97 fL   MCH 34.3 (H) 26.6 - 33.0 pg   MCHC 34.1 31.5 - 35.7 g/dL   RDW 78.4 69.6 - 29.5 %   Platelets 171 150 - 450 x10E3/uL       07/07/2023    8:01 AM 01/03/2023   10:04 AM 12/14/2022    8:56 AM 12/01/2022    8:51 AM 06/03/2022    9:28 AM  Depression screen PHQ 2/9  Decreased Interest 0 0 0 0 1  Down, Depressed, Hopeless 0 0 0 0 0  PHQ - 2 Score 0 0 0 0 1  Altered sleeping 0 0 1  1  Tired, decreased energy 0 0 1  1  Change in appetite 0 0 0  0  Feeling bad or failure about yourself  0 0 0  0  Trouble concentrating 0 0 1  1  Moving slowly or fidgety/restless 0 0 0  0  Suicidal thoughts 0 0 0  0  PHQ-9 Score 0 0 3  4  Difficult doing work/chores Not difficult at all Not difficult at all Not difficult at all  Not difficult at all       07/07/2023    8:00 AM 01/03/2023   10:04 AM 12/14/2022    8:57 AM 06/03/2022    9:28 AM  GAD 7 : Generalized Anxiety Score  Nervous, Anxious, on Edge 0 0 0 0  Control/stop worrying 0 0 0 0  Worry too much - different things 0 0 0 0  Trouble relaxing 0 1 0 0  Restless 0 0 0 0  Easily annoyed or irritable 0 0 0 0  Afraid - awful might happen 0 0 0 0  Total GAD 7 Score 0 1 0 0  Anxiety Difficulty Not difficult at all Somewhat difficult Not difficult at all Not difficult at all   Pertinent labs & imaging results that were available during my care of the patient were reviewed by me and considered in my medical decision making.  Assessment & Plan:  Glendon was seen today  for back pain.  Diagnoses and all orders for this visit:  1. Acute right-sided low back pain, unspecified whether sciatica present (Primary) Will send in low dose and short course of medication below. Cautious with use given age and renal function. Discussed with patient the side effects. He is already taking gabapentin  and tylenol . Continue lidocaine  patches. Completed steroids. Declined referral to PT at this time.  Imaging ordered. Will communicate results to patient once available. Will await results to determine next steps.  - tiZANidine (ZANAFLEX) 2 MG tablet; Take 1 tablet (2 mg total) by mouth daily as needed for muscle spasms.  Dispense: 8 tablet; Refill: 0  Impaired renal function As above.   Continue all other maintenance medications.  Follow up plan: Return if symptoms worsen or fail to improve.   Continue healthy lifestyle choices, including diet (rich in fruits, vegetables, and lean proteins, and low in salt and simple carbohydrates) and exercise (at least 30 minutes of moderate physical activity daily).  Written and verbal instructions provided   The above assessment and management plan was discussed with the patient. The patient verbalized understanding of and has agreed to the management plan. Patient is aware to call the clinic if they develop any new symptoms or if symptoms persist or worsen. Patient is aware when to return to the clinic for a follow-up visit. Patient educated on when it is appropriate to go to the emergency department.   Jacqualyn Mates, DNP-FNP [facility] [address] [place], [place] [address] [phone]

## 2023-11-14 NOTE — Progress Notes (Unsigned)
 error

## 2023-11-16 ENCOUNTER — Ambulatory Visit (INDEPENDENT_AMBULATORY_CARE_PROVIDER_SITE_OTHER): Admitting: Family Medicine

## 2023-11-16 ENCOUNTER — Encounter: Payer: Self-pay | Admitting: Family Medicine

## 2023-11-16 VITALS — BP 106/63 | HR 93 | Temp 97.9°F | Ht 70.0 in | Wt 174.0 lb

## 2023-11-16 DIAGNOSIS — M792 Neuralgia and neuritis, unspecified: Secondary | ICD-10-CM

## 2023-11-16 DIAGNOSIS — M48062 Spinal stenosis, lumbar region with neurogenic claudication: Secondary | ICD-10-CM | POA: Diagnosis not present

## 2023-11-16 DIAGNOSIS — M4716 Other spondylosis with myelopathy, lumbar region: Secondary | ICD-10-CM

## 2023-11-16 MED ORDER — PREGABALIN 75 MG PO CAPS: 75.0000 mg | ORAL_CAPSULE | Freq: Every day | ORAL | 1 refills | Status: DC

## 2023-11-16 MED ORDER — GABAPENTIN 600 MG PO TABS: 600.0000 mg | ORAL_TABLET | Freq: Three times a day (TID) | ORAL | 1 refills | Status: DC

## 2023-11-16 NOTE — Progress Notes (Signed)
 Subjective:  Patient ID: Tyler Diaz, male    DOB: 05/21/1946  Age: 78 y.o. MRN: [id number]  CC: Back Pain (Lower back pain and right leg pain ongoing. Worse in the morning. Pain goes down the front of leg and he said it feels like he got hit in the funny bone but worse. Helps to bend over and hurts a lot when he has to straighten up. Tingling in lower leg. )   HPI Adron Horns presents for lower back pain worst when getting up in the morning. Walks around bent over for a while. Hard pain in right buttocks with radiation to anterior right thigh. Like bumping funny bone except 10X worse.  Remote onset but recent worsening.     07/07/2023    8:01 AM 01/03/2023   10:04 AM 12/14/2022    8:56 AM  Depression screen PHQ 2/9  Decreased Interest 0 0 0  Down, Depressed, Hopeless 0 0 0  PHQ - 2 Score 0 0 0  Altered sleeping 0 0 1  Tired, decreased energy 0 0 1  Change in appetite 0 0 0  Feeling bad or failure about yourself  0 0 0  Trouble concentrating 0 0 1  Moving slowly or fidgety/restless 0 0 0  Suicidal thoughts 0 0 0  PHQ-9 Score 0 0 3  Difficult doing work/chores Not difficult at all Not difficult at all Not difficult at all    History Sahil has a past medical history of Anemia, Arthritis, Blood clot in vein, C. difficile colitis (08/19/2013), Colon cancer (HCC) (07/18/12 bx), GERD (gastroesophageal reflux disease), Hearing loss, Heart murmur, Hematochezia, Herpes zoster - R flank - with severe pain (03/11/2013), History of shingles, Hyperlipidemia, Hypertension, Hypothyroidism (as child), Ileostomy - diverting loop - s/p takedown 06/28/2013 (01/14/2013), Mitral regurgitation due to partially flail posterior mitral valve leaflet, Nocturia, Numbness, Peripheral vascular disease (HCC), Radiation, Rectal cancer (HCC), Rheumatic fever (as child), Sinusitis, Staphylococcus aureus bacteremia (07/12/2019), Staphylococcus aureus sepsis (HCC) (07/17/2019), Tinnitus, and Umbilical hernia, incarcerated -  s/p primary repair July 2014 (01/03/2013).   He has a past surgical history that includes Carpal tunnel release (Right, 2002); Tonsillectomy; Laparoscopic low anterior resection (N/A, 12/27/2012); Laparoscopic low anterior rescection with coloanal anastomosis (N/A, 12/27/2012); Ileostomy (N/A, 12/27/2012); Umbilical hernia repair (12/27/2012); Inguinal hernia repair (Left); Ileostomy closure (N/A, 06/28/2013); TEE without cardioversion (N/A, 07/10/2013); TEE without cardioversion (N/A, 08/21/2013); Inguinal hernia repair (Bilateral, 08/13/2015); Insertion of mesh (Bilateral, 08/13/2015); and TEE without cardioversion (N/A, 07/16/2019).   His family history includes COPD in his brother; Cancer in his mother and sister; Healthy in his son and son; Heart disease in his brother and father.He reports that he quit smoking about 51 years ago. His smoking use included cigarettes. He started smoking about 71 years ago. He has a 30 pack-year smoking history. He has never used smokeless tobacco. He reports that he does not drink alcohol and does not use drugs.    ROS Review of Systems  Constitutional:  Negative for fever.  Respiratory:  Negative for shortness of breath.   Cardiovascular:  Negative for chest pain.  Musculoskeletal:  Negative for arthralgias.  Skin:  Negative for rash.    Objective:  BP 106/63   Pulse 93   Temp 97.9 F (36.6 C)   Ht 5' 10 (1.778 m)   Wt 174 lb (78.9 kg)   SpO2 95%   BMI 24.97 kg/m   BP Readings from Last 3 Encounters:  11/16/23 106/63  11/10/23 137/70  11/03/23 125/75    Wt Readings from Last 3 Encounters:  11/16/23 174 lb (78.9 kg)  11/10/23 176 lb 12.8 oz (80.2 kg)  11/03/23 175 lb (79.4 kg)     Physical Exam Vitals reviewed.  Constitutional:      Appearance: He is well-developed.  HENT:     Head: Normocephalic and atraumatic.     Right Ear: External ear normal.     Left Ear: External ear normal.     Mouth/Throat:     Pharynx: No oropharyngeal exudate or  posterior oropharyngeal erythema.   Eyes:     Pupils: Pupils are equal, round, and reactive to light.    Cardiovascular:     Rate and Rhythm: Normal rate and regular rhythm.     Heart sounds: No murmur heard. Pulmonary:     Effort: No respiratory distress.     Breath sounds: Normal breath sounds.   Musculoskeletal:        General: Deformity present.     Cervical back: Normal range of motion and neck supple.   Neurological:     Mental Status: He is alert and oriented to person, place, and time.   Patient tends to stoop forward with ambulation to avoid pain spinal stenosis lumbar   Assessment & Plan:  Osteoarthritis of spine with myelopathy, lumbar region -     MR LUMBAR SPINE WO CONTRAST; Future -     Gabapentin ; Take 1 tablet (600 mg total) by mouth 3 (three) times daily.  Dispense: 90 tablet; Refill: 1  Neuropathic pain -     MR LUMBAR SPINE WO CONTRAST; Future -     Gabapentin ; Take 1 tablet (600 mg total) by mouth 3 (three) times daily.  Dispense: 90 tablet; Refill: 1  Spinal stenosis of lumbar region with neurogenic claudication -     MR LUMBAR SPINE WO CONTRAST; Future -     Gabapentin ; Take 1 tablet (600 mg total) by mouth 3 (three) times daily.  Dispense: 90 tablet; Refill: 1     Follow-up: No follow-ups on file.  Roise Cleaver, M.D.

## 2023-11-17 ENCOUNTER — Encounter: Payer: Self-pay | Admitting: Family Medicine

## 2023-11-17 ENCOUNTER — Ambulatory Visit: Payer: Self-pay | Admitting: Family Medicine

## 2023-11-17 DIAGNOSIS — M4716 Other spondylosis with myelopathy, lumbar region: Secondary | ICD-10-CM

## 2023-11-17 NOTE — Progress Notes (Signed)
 Concern for transitional anatomy and evidence of degenerative changes. Can refer to PT and ortho for further management if patient would like.

## 2023-11-20 ENCOUNTER — Other Ambulatory Visit: Payer: Self-pay | Admitting: Family Medicine

## 2023-11-20 DIAGNOSIS — M545 Low back pain, unspecified: Secondary | ICD-10-CM

## 2023-11-20 NOTE — Telephone Encounter (Unsigned)
 Copied from CRM [id number]. Topic: Clinical - Medication Refill >> Nov 20, 2023  4:54 PM Phil Braun wrote: Medication: tiZANidine  (ZANAFLEX ) 2 MG tablet   Has the patient contacted their pharmacy? Yes  This is the patient's preferred pharmacy:  [id number] - [place], [place] - [address] [place] [address] Phone: [phone] Fax: [phone]   Is this the correct pharmacy for this prescription? Yes If no, delete pharmacy and type the correct one.   Has the prescription been filled recently? Yes  Is the patient out of the medication? Yes  Has the patient been seen for an appointment in the last year OR does the patient have an upcoming appointment? Yes  Can we respond through MyChart? No  Agent: Please be advised that Rx refills may take up to 3 business days. We ask that you follow-up with your pharmacy.

## 2023-11-30 ENCOUNTER — Ambulatory Visit (HOSPITAL_COMMUNITY)
Admission: RE | Admit: 2023-11-30 | Discharge: 2023-11-30 | Disposition: A | Discharge status: Home / Self Care | Source: Ambulatory Visit | Attending: Family Medicine | Admitting: Family Medicine

## 2023-11-30 DIAGNOSIS — M5116 Intervertebral disc disorders with radiculopathy, lumbar region: Secondary | ICD-10-CM | POA: Diagnosis not present

## 2023-11-30 DIAGNOSIS — M48062 Spinal stenosis, lumbar region with neurogenic claudication: Secondary | ICD-10-CM | POA: Diagnosis not present

## 2023-11-30 DIAGNOSIS — M4807 Spinal stenosis, lumbosacral region: Secondary | ICD-10-CM | POA: Diagnosis not present

## 2023-11-30 DIAGNOSIS — M5115 Intervertebral disc disorders with radiculopathy, thoracolumbar region: Secondary | ICD-10-CM | POA: Diagnosis not present

## 2023-11-30 DIAGNOSIS — M792 Neuralgia and neuritis, unspecified: Secondary | ICD-10-CM | POA: Insufficient documentation

## 2023-11-30 DIAGNOSIS — M4716 Other spondylosis with myelopathy, lumbar region: Secondary | ICD-10-CM | POA: Insufficient documentation

## 2023-12-04 ENCOUNTER — Ambulatory Visit (INDEPENDENT_AMBULATORY_CARE_PROVIDER_SITE_OTHER): Payer: PPO

## 2023-12-04 VITALS — BP 113/66 | HR 70 | Ht 70.0 in | Wt 174.0 lb

## 2023-12-04 DIAGNOSIS — Z0000 Encounter for general adult medical examination without abnormal findings: Secondary | ICD-10-CM

## 2023-12-04 NOTE — Patient Instructions (Signed)
 Tyler Diaz , Thank you for taking time out of your busy schedule to complete your Annual Wellness Visit with me. I enjoyed our conversation and look forward to speaking with you again next year. I, as well as your care team,  appreciate your ongoing commitment to your health goals. Please review the following plan we discussed and let me know if I can assist you in the future. Your Game plan/ To Do List    Follow up Visits: Next Medicare AWV with our clinical staff: 12/04/24 at 8:40a.m.   Next Office Visit with your provider: 12/15/23 at 10:30a.m.  Clinician Recommendations:  Aim for 30 minutes of exercise or brisk walking, 6-8 glasses of water , and 5 servings of fruits and vegetables each day. N/a      This is a list of the screening recommended for you and due dates:  Health Maintenance  Topic Date Due   Zoster (Shingles) Vaccine (1 of 2) 02/03/2024*   COVID-19 Vaccine (4 - 2024-25 season) 12/19/2024*   Flu Shot  01/05/2024   Medicare Annual Wellness Visit  12/03/2024   Colon Cancer Screening  02/24/2027   DTaP/Tdap/Td vaccine (2 - Td or Tdap) 11/25/2030   Pneumococcal Vaccine for age over 80  Completed   Hepatitis B Vaccine  Aged Out   HPV Vaccine  Aged Out   Meningitis B Vaccine  Aged Out   Hepatitis C Screening  Discontinued  *Topic was postponed. The date shown is not the original due date.    Advanced directives: (Declined) Advance directive discussed with you today. Even though you declined this today, please call our office should you change your mind, and we can give you the proper paperwork for you to fill out. Advance Care Planning is important because it:  [x]  Makes sure you receive the medical care that is consistent with your values, goals, and preferences  [x]  It provides guidance to your family and loved ones and reduces their decisional burden about whether or not they are making the right decisions based on your wishes.  Follow the link provided in your after visit  summary or read over the paperwork we have mailed to you to help you started getting your Advance Directives in place. If you need assistance in completing these, please reach out to us  so that we can help you!  See attachments for Preventive Care and Fall Prevention Tips.

## 2023-12-04 NOTE — Progress Notes (Signed)
 Subjective:   Tyler Diaz is a 78 y.o. who presents for a Medicare Wellness preventive visit.  As a reminder, Annual Wellness Visits don't include a physical exam, and some assessments may be limited, especially if this visit is performed virtually. We may recommend an in-person follow-up visit with your provider if needed.  Visit Complete: Virtual I connected with  Tyler Diaz on 12/04/23 by a audio enabled telemedicine application and verified that I am speaking with the correct person using two identifiers.  Patient Location: Home  Provider Location: Home Office  I discussed the limitations of evaluation and management by telemedicine. The patient expressed understanding and agreed to proceed.  Vital Signs: Because this visit was a virtual/telehealth visit, some criteria may be missing or patient reported. Any vitals not documented were not able to be obtained and vitals that have been documented are patient reported.  VideoDeclined- This patient declined Librarian, academic. Therefore the visit was completed with audio only.  Persons Participating in Visit: Patient.  AWV Questionnaire: No: Patient Medicare AWV questionnaire was not completed prior to this visit.  Cardiac Risk Factors include: advanced age (>13men, >64 women);hypertension;male gender     Objective:    Today's Vitals   12/04/23 0843 12/04/23 0845  BP: 113/66   Pulse: 70   Weight: 174 lb (78.9 kg)   Height: 5' 10 (1.778 m)   PainSc:  5    Body mass index is 24.97 kg/m.     12/04/2023    8:54 AM 12/01/2022    8:52 AM 11/29/2021    9:05 AM 11/26/2020    9:27 AM 11/26/2019    9:36 AM 07/12/2019    3:02 AM 07/11/2019    2:42 PM  Advanced Directives  Does Patient Have a Medical Advance Directive? No Yes No No No  Yes  Type of Furniture conservator/restorer;Living will     Living will  Does patient want to make changes to medical advance directive?       No -  Patient declined  Copy of Healthcare Power of Attorney in Chart?  No - copy requested       Would patient like information on creating a medical advance directive?   No - Patient declined No - Patient declined No - Patient declined No - Patient declined No - Patient declined    Current Medications (verified) Outpatient Encounter Medications as of 12/04/2023  Medication Sig   acetaminophen  (TYLENOL ) 500 MG tablet Take 1,000 mg by mouth every 6 (six) hours as needed for headache.    clotrimazole -betamethasone  (LOTRISONE ) cream Apply 1 application topically 2 (two) times daily. To neck for rash for up to 2 weeks.   diclofenac  Sodium (VOLTAREN ) 1 % GEL APPLY 4 GRAMS TOPICALLY 4 TIMES DAILY FOR JOINT PAIN   gabapentin  (NEURONTIN ) 600 MG tablet Take 1 tablet (600 mg total) by mouth 3 (three) times daily.   hydrochlorothiazide  (HYDRODIURIL ) 25 MG tablet TAKE 1 TABLET BY MOUTH ONCE DAILY STOP  CHLORTHALIDONE    hydrocortisone  (PROCTO-MED HC ) 2.5 % rectal cream Place rectally 2 (two) times daily as needed for hemorrhoids or anal itching. For no more than 3-5 days per flareup   ketoconazole  (NIZORAL ) 2 % cream Apply 1 application topically 2 (two) times daily.   lidocaine  (LIDODERM ) 5 % Place 1 patch onto the skin daily as needed (pain). Remove & Discard patch within 12 hours or as directed by MD. IF TOO EXPENSIVE PLEASE GRAB 4%  patch OTC for him   lisinopril  (ZESTRIL ) 40 MG tablet Take 1 tablet (40 mg total) by mouth daily.   Multiple Vitamin (MULTIVITAMIN WITH MINERALS) TABS Take 1 tablet by mouth every morning.    Olopatadine  HCl 0.2 % SOLN Instill 1 drop in each eye ONCE daily.   oxymetazoline  (AFRIN) 0.05 % nasal spray Place 1 spray into both nostrils 2 (two) times daily as needed for congestion.   pravastatin  (PRAVACHOL ) 20 MG tablet Take 1 tablet (20 mg total) by mouth daily.   Probiotic Product (DIGESTIVE ADVANTAGE PO) Take 1 tablet by mouth daily.   psyllium (METAMUCIL) 58.6 % packet Take 1 packet  by mouth daily.   tiZANidine  (ZANAFLEX ) 2 MG tablet TAKE 1 TABLET BY MOUTH ONCE DAILY AS NEEDED FOR MUSCLE SPASM   triamcinolone  cream (KENALOG ) 0.1 % APPLY  CREAM TOPICALLY TO AFFECTED AREA THREE TIMES DAILY AS NEEDED   No facility-administered encounter medications on file as of 12/04/2023.    Allergies (verified) Shrimp [shellfish allergy]   History: Past Medical History:  Diagnosis Date   Anemia    hx of   Arthritis    hands/knees   Blood clot in vein    RT ARM WITH PICC LINE    C. difficile colitis 08/19/2013   Colon cancer (HCC) 07/18/12 bx   rectum=Invasive adenocarcinoma w/ extracellular mucin,polyp=benign   GERD (gastroesophageal reflux disease)    OCCASIONAL   Hearing loss    partial greater on left   Heart murmur    Hematochezia    recent   Herpes zoster - R flank - with severe pain 03/11/2013   History of shingles    Hyperlipidemia    Hypertension    Hypothyroidism as child   hx of   Ileostomy - diverting loop - s/p takedown 06/28/2013 01/14/2013   Mitral regurgitation due to partially flail posterior mitral valve leaflet    Nocturia    Numbness    TOES / FINGERS DUE TO CHEMO   Peripheral vascular disease (HCC)    neuropathy due to cancer treatment (fingers and toes)   Radiation    FOR COLON CANCER   Rectal cancer (HCC)    Rheumatic fever as child   hx   Sinusitis    seasonal   Staphylococcus aureus bacteremia 07/12/2019   Staphylococcus aureus sepsis (HCC) 07/17/2019   Tinnitus    left ear   Umbilical hernia, incarcerated - s/p primary repair July 2014 01/03/2013   Past Surgical History:  Procedure Laterality Date   CARPAL TUNNEL RELEASE Right 2002   ILEOSTOMY N/A 12/27/2012   Procedure: DIVERTING LOOP ILEOSTOMY;  Surgeon: Elspeth KYM Schultze, MD;  Location: [facility];  Service: General;  Laterality: N/A;   ILEOSTOMY CLOSURE N/A 06/28/2013   Procedure: loop ILEOSTOMY TAKEDOWN examination of anesthesia ;  Surgeon: Elspeth KYM Schultze, MD;  Location: [facility];  Service:  General;  Laterality: N/A;   INGUINAL HERNIA REPAIR Left    INGUINAL HERNIA REPAIR Bilateral 08/13/2015   Procedure: LAPAROSCOPIC BILATERAL INGUINAL HERNIA REPAIR WITH MESH;  Surgeon: Elspeth Schultze, MD;  Location: [facility];  Service: General;  Laterality: Bilateral;   INSERTION OF MESH Bilateral 08/13/2015   Procedure: INSERTION OF MESH;  Surgeon: Elspeth Schultze, MD;  Location: [facility];  Service: General;  Laterality: Bilateral;   LAPAROSCOPIC LOW ANTERIOR RESCECTION WITH COLOANAL ANASTOMOSIS N/A 12/27/2012   Procedure: LAPAROSCOPIC LOW ANTERIOR RESCECTION WITH COLOANAL ANASTOMOSIS;  Surgeon: Elspeth KYM Schultze, MD;  Location: [facility];  Service: General;  Laterality:  N/A;   LAPAROSCOPIC LOW ANTERIOR RESECTION N/A 12/27/2012   Procedure: LAPAROSCOPIC LOW ANTERIOR RESECTION, COLO-ANAL ANASTOMOSIS, DIVERTING LOOP ILEOSTOMY,SPLENIC FLEXURE MOBILIZATION, PRIMARY INCARCERATED UMBILICAL HERNIA REPAIR;  Surgeon: Elspeth KYM Schultze, MD;  Location: [facility];  Service: General;  Laterality: N/A;   TEE WITHOUT CARDIOVERSION N/A 07/10/2013   Procedure: TRANSESOPHAGEAL ECHOCARDIOGRAM (TEE);  Surgeon: Oneil Parchment, MD;  Location: [facility] ENDOSCOPY;  Service: Cardiovascular;  Laterality: N/A;   TEE WITHOUT CARDIOVERSION N/A 08/21/2013   Procedure: TRANSESOPHAGEAL ECHOCARDIOGRAM (TEE);  Surgeon: Jerel Balding, MD;  Location: [facility] ENDOSCOPY;  Service: Cardiovascular;  Laterality: N/A;   TEE WITHOUT CARDIOVERSION N/A 07/16/2019   Procedure: TRANSESOPHAGEAL ECHOCARDIOGRAM (TEE) WITH PROPOFOL ;  Surgeon: Alvan Dorn FALCON, MD;  Location: [facility];  Service: Endoscopy;  Laterality: N/A;   TONSILLECTOMY     as child   UMBILICAL HERNIA REPAIR  12/27/2012   Procedure: PRIMARY REPAIR INCARCERATED UMBILICAL HERNIA;  Surgeon: Elspeth KYM Schultze, MD;  Location: [facility];  Service: General;;   Family History  Problem Relation Age of Onset   Cancer Mother        abdominal ca ?ovarian?   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  Has two adult children and one grandchild. One son lives with him.    Social Drivers of Corporate investment banker Strain: Low Risk  (12/04/2023)   Overall Financial Resource Strain (CARDIA)    Difficulty of Paying Living Expenses: Not hard at all  Food Insecurity: No Food Insecurity (12/04/2023)   Hunger Vital Sign    Worried About Running Out of Food in the Last Year: Never true    Ran Out of Food in the Last Year: Never true  Transportation Needs: No Transportation Needs (12/04/2023)   PRAPARE - Administrator, Civil Service (Medical): No    Lack of Transportation (Non-Medical): No  Physical Activity: Sufficiently Active (12/04/2023)   Exercise Vital Sign    Days of Exercise per Week: 7 days    Minutes of Exercise per Session: 60 min  Stress: No Stress Concern Present (12/04/2023)   [employer] of Occupational Health - Occupational Stress Questionnaire    Feeling of Stress: Not at all  Social Connections: Moderately Isolated (12/04/2023)   Social Connection and Isolation Panel    Frequency of  Communication with Friends and  Family: More than three times a week    Frequency of Social Gatherings with Friends and Family: More than three times a week    Attends Religious Services: Never    Database administrator or Organizations: Yes    Attends Banker Meetings: Never    Marital Status: Widowed    Tobacco Counseling Counseling given: Yes    Clinical Intake:  Pre-visit preparation completed: Yes  Pain : 0-10 (lower back/down to R-hip to leg) Pain Score: 5  (it gets better when pt is sitting down) Pain Type: Chronic pain Pain Location: Back Pain Orientation: Right Pain Radiating Towards: r-hip to leg Pain Descriptors / Indicators: Aching, Constant Pain Onset: More than a month ago Pain Frequency: Constant Pain Relieving Factors: Rx pain  Pain Relieving Factors: Rx pain  BMI - recorded: 24.97 Nutritional Status: BMI of 19-24  Normal Nutritional Risks: None Diabetes: No  No results found for: HGBA1C   How often do you need to have someone help you when you read instructions, pamphlets, or other written materials from your doctor or pharmacy?: 1 - Never  Interpreter Needed?: No  Information entered by :: Alia t/cma   Activities of Daily Living     12/04/2023    8:51 AM  In your present state of health, do you have any difficulty performing the following activities:  Hearing? 1  Vision? 0  Difficulty concentrating or making decisions? 1  Walking or climbing stairs? 0  Dressing or bathing? 0  Doing errands, shopping? 0  Preparing Food and eating ? N  Using the Toilet? N  In the past six months, have you accidently leaked urine? N  Do you have problems with loss of bowel control? Y  Managing your Medications? N  Managing your Finances? N  Housekeeping or managing your Housekeeping? N    Patient Care Team: Jolinda Norene HERO, DO as PCP - General (Family Medicine) Alvan Dorn FALCON, MD as PCP - Cardiology (Cardiology) Cloretta Arley NOVAK,  MD as Consulting Physician (Oncology) Ladora Ross Lacy Phebe, MD as Referring Physician (Optometry)  I have updated your Care Teams any recent Medical Services you may have received from other providers in the past year.     Assessment:   This is a routine wellness examination for Tyler.  Hearing/Vision screen Hearing Screening - Comments:: Pt have some hearing loss/sug getting hearing test Vision Screening - Comments:: Pt wears glasses/need to make an appt need new lens/pt goes to [facility] in [facility]   Goals Addressed             This Visit's Progress    Prevent falls   On track    Move carefully to avoid falls       Depression Screen     12/04/2023    8:57 AM 11/10/2023    3:36 PM 07/07/2023    8:01 AM 01/03/2023   10:04 AM 12/14/2022    8:56 AM 12/01/2022    8:51 AM 06/03/2022    9:28 AM  PHQ 2/9 Scores  PHQ - 2 Score 0  0 0 0 0 1  PHQ- 9 Score 0  0 0 3  4  Exception Documentation  Patient refusal         Fall Risk     12/04/2023    8:45 AM 11/03/2023   10:34 AM 07/07/2023    8:01 AM 12/01/2022    8:50 AM 06/03/2022    9:28 AM  Fall Risk   Falls  in the past year? 0 0 0 0 0  Number falls in past yr: 0 0 0 0   Injury with Fall? 0 0 0 0   Risk for fall due to : No Fall Risks No Fall Risks No Fall Risks No Fall Risks   Follow up Falls evaluation completed Falls evaluation completed Falls evaluation completed Falls prevention discussed     MEDICARE RISK AT HOME:  Medicare Risk at Home Any stairs in or around the home?: No If so, are there any without handrails?: No Home free of loose throw rugs in walkways, pet beds, electrical cords, etc?: Yes Adequate lighting in your home to reduce risk of falls?: Yes Life alert?: No Use of a cane, walker or w/c?: No Grab bars in the bathroom?: Yes Shower chair or bench in shower?: No Elevated toilet seat or a handicapped toilet?: Yes  TIMED UP AND GO:  Was the test performed?  no  Cognitive Function: 6CIT completed     11/13/2017   11:34 AM 11/13/2017   11:32 AM 11/11/2016    8:57 AM  MMSE - Mini Mental State Exam  Orientation to time 5 5 5    Orientation to Place 5 5 5    Registration 3  3   Attention/ Calculation 0  4   Attention/Calculation-comments didn't attempt. Was able to spell forward    Recall 3  1   Language- name 2 objects 2  2   Language- repeat 1  1  Language- follow 3 step command 3  3   Language- read & follow direction 1  1   Write a sentence 1  1   Copy design 1  1   Total score 25  27      Data saved with a previous flowsheet row definition        12/04/2023    9:00 AM 12/01/2022    8:53 AM 11/29/2021    9:06 AM 11/26/2020    9:18 AM 11/26/2019    9:48 AM  6CIT Screen  What Year? 0 points 0 points 0 points 0 points 0 points  What month? 0 points 0 points 0 points 0 points 0 points  What time? 0 points 0 points 0 points 0 points 0 points  Count back from 20 0 points 0 points 0 points 0 points 0 points  Months in reverse 0 points 0 points 4 points 0 points 2 points  Repeat phrase 0 points 0 points 6 points 2 points 0 points  Total Score 0 points 0 points 10 points 2 points 2 points    Immunizations Immunization History  Administered Date(s) Administered   Fluad Quad(high Dose 65+) 03/21/2019, 03/25/2020, 04/01/2021, 03/30/2022   Fluad Trivalent(High Dose 65+) 03/21/2023   Influenza Inj Mdck Quad Pf 03/21/2019   Influenza, High Dose Seasonal PF 03/16/2018   Influenza,inj,Quad PF,6+ Mos 05/21/2013, 03/13/2014, 03/10/2015, 03/14/2016, 03/08/2017   Moderna Sars-Covid-2 Vaccination 03/25/2020, 04/29/2020, 11/24/2020   Pneumococcal Conjugate-13 06/06/2014   Pneumococcal Polysaccharide-23 05/21/2013   Tdap 11/24/2020    Screening Tests Health Maintenance  Topic Date Due   Zoster Vaccines- Shingrix (1 of 2) 02/03/2024 (Originally 09/11/1964)   COVID-19 Vaccine (4 - 2024-25 season) 12/19/2024 (Originally 02/05/2023)   INFLUENZA VACCINE  01/05/2024   Medicare Annual Wellness (AWV)   12/03/2024   Colonoscopy  02/24/2027   DTaP/Tdap/Td (2 - Td or Tdap) 11/25/2030   Pneumococcal Vaccine: 50+ Years  Completed   Hepatitis B Vaccines  Aged Out   HPV VACCINES  Aged Out   Meningococcal B Vaccine  Aged Out   Hepatitis C Screening  Discontinued    Health Maintenance  There are no preventive care reminders to display for this patient.  Health Maintenance Items Addressed: See Nurse Notes at the end of this note  Additional Screening:  Vision Screening: Recommended annual ophthalmology exams for early detection of glaucoma and other disorders of the eye. Would you like a referral to an eye doctor? No    Dental Screening: Recommended annual dental exams for proper oral hygiene  Community Resource Referral / Chronic Care Management: CRR required this visit?  No   CCM required this visit?  No   Plan:    I have personally reviewed and noted the following in the patient's chart:   Medical and social history Use of alcohol, tobacco or illicit drugs  Current medications and supplements including opioid prescriptions. Patient is not currently taking opioid prescriptions. Functional ability and status Nutritional status Physical activity Advanced directives List of other physicians Hospitalizations, surgeries, and ER visits in previous 12 months Vitals Screenings to include cognitive, depression, and falls Referrals and appointments  In addition, I have reviewed and discussed with patient certain preventive protocols, quality metrics, and best practice recommendations. A written personalized care plan for preventive services as well as general preventive health recommendations were provided to patient.   Ozie Ned, CMA   12/04/2023   After Visit Summary: (Declined) Due to this being a telephonic visit, with patients personalized plan was offered to patient but patient Declined AVS at this time   Notes: Nothing significant to report at this time.

## 2023-12-06 ENCOUNTER — Telehealth: Payer: Self-pay

## 2023-12-06 NOTE — Telephone Encounter (Signed)
 Results are not back will update once they have been reviewed

## 2023-12-06 NOTE — Telephone Encounter (Signed)
 Copied from CRM [id number]. Topic: Clinical - Lab/Test Results >> Dec 06, 2023  3:03 PM Montie POUR wrote: Reason for CRM:  Please call Mr. Gentles at [phone] to review the results from his MRI and what the next steps will be.

## 2023-12-14 ENCOUNTER — Ambulatory Visit: Payer: Self-pay | Admitting: Family Medicine

## 2023-12-15 ENCOUNTER — Encounter: Payer: Self-pay | Admitting: Family Medicine

## 2023-12-15 ENCOUNTER — Ambulatory Visit: Payer: PPO | Admitting: Family Medicine

## 2023-12-15 VITALS — BP 120/64 | HR 75 | Temp 97.9°F | Ht 68.5 in | Wt 178.4 lb

## 2023-12-15 DIAGNOSIS — I10 Essential (primary) hypertension: Secondary | ICD-10-CM

## 2023-12-15 DIAGNOSIS — M4716 Other spondylosis with myelopathy, lumbar region: Secondary | ICD-10-CM

## 2023-12-15 DIAGNOSIS — N401 Enlarged prostate with lower urinary tract symptoms: Secondary | ICD-10-CM

## 2023-12-15 DIAGNOSIS — M48062 Spinal stenosis, lumbar region with neurogenic claudication: Secondary | ICD-10-CM | POA: Diagnosis not present

## 2023-12-15 DIAGNOSIS — Z85048 Personal history of other malignant neoplasm of rectum, rectosigmoid junction, and anus: Secondary | ICD-10-CM

## 2023-12-15 DIAGNOSIS — R3914 Feeling of incomplete bladder emptying: Secondary | ICD-10-CM

## 2023-12-15 DIAGNOSIS — E781 Pure hyperglyceridemia: Secondary | ICD-10-CM | POA: Diagnosis not present

## 2023-12-15 DIAGNOSIS — Z0001 Encounter for general adult medical examination with abnormal findings: Secondary | ICD-10-CM | POA: Diagnosis not present

## 2023-12-15 DIAGNOSIS — K640 First degree hemorrhoids: Secondary | ICD-10-CM | POA: Diagnosis not present

## 2023-12-15 DIAGNOSIS — D539 Nutritional anemia, unspecified: Secondary | ICD-10-CM

## 2023-12-15 DIAGNOSIS — M792 Neuralgia and neuritis, unspecified: Secondary | ICD-10-CM

## 2023-12-15 DIAGNOSIS — Z0000 Encounter for general adult medical examination without abnormal findings: Secondary | ICD-10-CM

## 2023-12-15 MED ORDER — HYDROCHLOROTHIAZIDE 25 MG PO TABS: ORAL_TABLET | ORAL | 3 refills | Status: AC

## 2023-12-15 MED ORDER — TIZANIDINE HCL 2 MG PO TABS: 2.0000 mg | ORAL_TABLET | Freq: Three times a day (TID) | ORAL | 2 refills | Status: AC | PRN

## 2023-12-15 MED ORDER — PRAVASTATIN SODIUM 20 MG PO TABS: 20.0000 mg | ORAL_TABLET | Freq: Every day | ORAL | 3 refills | Status: AC

## 2023-12-15 MED ORDER — LISINOPRIL 40 MG PO TABS: 40.0000 mg | ORAL_TABLET | Freq: Every day | ORAL | 3 refills | Status: AC

## 2023-12-15 MED ORDER — GABAPENTIN 600 MG PO TABS: 600.0000 mg | ORAL_TABLET | Freq: Three times a day (TID) | ORAL | 4 refills | Status: AC

## 2023-12-15 MED ORDER — HYDROCORTISONE (PERIANAL) 2.5 % EX CREA
TOPICAL_CREAM | Freq: Two times a day (BID) | CUTANEOUS | 0 refills | Status: DC | PRN
Start: 2023-12-15 — End: 2024-02-23

## 2023-12-15 NOTE — Progress Notes (Signed)
 Tyler Diaz is a 78 y.o. male presents to office today for annual physical exam examination.    Concerns today include: 1.  Low back pain He reports that after our visit he was actually seen x 2.  He was given a muscle relaxer which has been helpful when the gabapentin  is insufficient for pain control and then he was also seen again later and had MRI of the lumbar spine which demonstrated advanced lumbar disc and facet degeneration with moderate spinal stenosis and moderate bilateral neural foraminal stenosis at L3 on L4.  He also had moderate lateral recess and neural foraminal stenosis at L4 and L5.  He has been referred to [facility] in [place] but wants to cancel the appointment because gabapentin  has really been helping.  He denies any concerning side effects including excessive daytime sedation, dizziness, instability or falls.  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 Refills needed today: all  Immunization History  Administered Date(s) Administered   Fluad Quad(high Dose 65+) 03/21/2019, 03/25/2020, 04/01/2021, 03/30/2022   Fluad Trivalent(High Dose 65+) 03/21/2023   Influenza Inj Mdck Quad Pf 03/21/2019   Influenza, High Dose Seasonal PF 03/16/2018   Influenza,inj,Quad PF,6+ Mos 05/21/2013, 03/13/2014, 03/10/2015, 03/14/2016, 03/08/2017   Moderna Sars-Covid-2 Vaccination 03/25/2020, 04/29/2020, 11/24/2020   Pneumococcal Conjugate-13 06/06/2014   Pneumococcal Polysaccharide-23 05/21/2013   Tdap 11/24/2020   Past Medical History:  Diagnosis Date   Anemia    hx of   Arthritis    hands/knees   Blood clot in vein    RT ARM WITH PICC LINE    C. difficile colitis 08/19/2013   Colon cancer (HCC) 07/18/12 bx   rectum=Invasive adenocarcinoma w/ extracellular mucin,polyp=benign    GERD (gastroesophageal reflux disease)    OCCASIONAL   Hearing loss    partial greater on left   Heart murmur    Hematochezia    recent   Herpes zoster - R flank - with severe pain 03/11/2013   History of shingles    Hyperlipidemia    Hypertension    Hypothyroidism as child   hx of   Ileostomy - diverting loop - s/p takedown 06/28/2013 01/14/2013   Mitral regurgitation due to partially flail posterior mitral valve leaflet    Nocturia    Numbness    TOES / FINGERS DUE TO CHEMO   Peripheral vascular disease (HCC)    neuropathy due to cancer treatment (fingers and toes)   Radiation    FOR COLON CANCER   Rectal cancer (HCC)    Rheumatic fever as child   hx   Sinusitis    seasonal   Staphylococcus aureus bacteremia 07/12/2019   Staphylococcus aureus sepsis (HCC) 07/17/2019   Tinnitus    left ear   Umbilical hernia, incarcerated - s/p primary repair July 2014 01/03/2013   Social History   Socioeconomic History   Marital status: Widowed    Spouse name: Not on file   Number of children: 2   Years of education: 9   Highest education level: 9th grade  Occupational History   Occupation: Retired    Associate Professor: Clinical biochemist [employer]    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  Has two adult children and one grandchild. One son lives with him.    Social Drivers of Corporate investment banker Strain: Low Risk  (12/04/2023)   Overall Financial Resource Strain (CARDIA)    Difficulty  of Paying Living Expenses: Not hard at all  Food Insecurity: No Food Insecurity (12/04/2023)   Hunger Vital Sign    Worried About Running Out of Food in the Last Year: Never true    Ran Out of Food in the Last Year: Never true  Transportation Needs: No Transportation Needs (12/04/2023)   PRAPARE - Administrator, Civil Service (Medical): No    Lack of Transportation (Non-Medical): No  Physical Activity: Sufficiently Active (12/04/2023)   Exercise Vital Sign    Days of Exercise per Week: 7 days    Minutes of Exercise per Session: 60 min  Stress: No Stress Concern Present (12/04/2023)   [employer] of Occupational Health - Occupational Stress Questionnaire    Feeling of Stress: Not at all  Social Connections: Moderately Isolated (12/04/2023)   Social Connection and Isolation Panel    Frequency of Communication with Friends and Family: More than three times a week    Frequency of Social Gatherings with Friends and Family: More than three times a week    Attends Religious Services: Never    Database administrator or Organizations: Yes    Attends Banker Meetings: Never    Marital Status: Widowed  Intimate Partner Violence: Not At Risk (12/04/2023)   Humiliation, Afraid, Rape, and Kick questionnaire    Fear of Current or Ex-Partner: No    Emotionally Abused: No    Physically Abused: No    Sexually Abused: No   Past Surgical History:  Procedure Laterality Date   CARPAL TUNNEL RELEASE Right 2002   ILEOSTOMY N/A 12/27/2012   Procedure: DIVERTING LOOP ILEOSTOMY;  Surgeon: Elspeth KYM Schultze, MD;  Location: [facility];  Service: General;  Laterality: N/A;   ILEOSTOMY CLOSURE N/A 06/28/2013   Procedure: loop ILEOSTOMY TAKEDOWN examination of anesthesia ;  Surgeon: Elspeth KYM Schultze, MD;  Location: [facility];  Service: General;  Laterality: N/A;   INGUINAL HERNIA REPAIR Left    INGUINAL HERNIA REPAIR Bilateral 08/13/2015   Procedure: LAPAROSCOPIC BILATERAL INGUINAL HERNIA REPAIR  WITH MESH;  Surgeon: Elspeth Schultze, MD;  Location: [facility];  Service: General;  Laterality: Bilateral;   INSERTION OF MESH Bilateral 08/13/2015   Procedure: INSERTION OF MESH;  Surgeon: Elspeth Schultze, MD;  Location: [facility];  Service: General;  Laterality: Bilateral;   LAPAROSCOPIC LOW ANTERIOR RESCECTION WITH COLOANAL ANASTOMOSIS N/A 12/27/2012   Procedure: LAPAROSCOPIC LOW ANTERIOR RESCECTION WITH COLOANAL ANASTOMOSIS;  Surgeon: Elspeth KYM Schultze, MD;  Location: [facility];  Service: General;  Laterality: N/A;   LAPAROSCOPIC LOW ANTERIOR RESECTION N/A 12/27/2012   Procedure: LAPAROSCOPIC LOW ANTERIOR RESECTION, COLO-ANAL ANASTOMOSIS, DIVERTING LOOP ILEOSTOMY,SPLENIC FLEXURE MOBILIZATION, PRIMARY INCARCERATED UMBILICAL HERNIA REPAIR;  Surgeon: Elspeth KYM Schultze, MD;  Location: [facility];  Service: General;  Laterality: N/A;   TEE  WITHOUT CARDIOVERSION N/A 07/10/2013   Procedure: TRANSESOPHAGEAL ECHOCARDIOGRAM (TEE);  Surgeon: Oneil Parchment, MD;  Location: [facility] END[facility]COPY;  Service: Cardiovascular;  Laterality: N/A;   TEE WITHOUT CARDIOVERSION N/A 08/21/2013   Procedure: TRANSESOPHAGEAL ECHOCARDIOGRAM (TEE);  Surgeon: Jerel Balding, MD;  Location: [facility] ENDOSCOPY;  Service: Cardiovascular;  Laterality: N/A;   TEE WITHOUT CARDIOVERSION N/A 07/16/2019   Procedure: TRANSESOPHAGEAL ECHOCARDIOGRAM (TEE) WITH PROPOFOL ;  Surgeon: Alvan Dorn FALCON, MD;  Location: [facility];  Service: Endoscopy;  Laterality: N/A;   TONSILLECTOMY     as child   UMBILICAL HERNIA REPAIR  12/27/2012   Procedure: PRIMARY REPAIR INCARCERATED UMBILICAL HERNIA;  Surgeon: Elspeth KYM Schultze, MD;  Location: [facility];  Service: General;;   Family History  Problem Relation Age of Onset   Cancer Mother        abdominal ca ?ovarian?   Heart disease Father    Cancer Sister        liver cancer   Heart disease Brother    COPD Brother    Healthy Son    Healthy Son     Current Outpatient Medications:    acetaminophen  (TYLENOL ) 500 MG tablet, Take 1,000 mg by mouth  every 6 (six) hours as needed for headache. , Disp: , Rfl:    ketoconazole  (NIZORAL ) 2 % cream, Apply 1 application topically 2 (two) times daily., Disp: 60 g, Rfl: 1   Multiple Vitamin (MULTIVITAMIN WITH MINERALS) TABS, Take 1 tablet by mouth every morning. , Disp: , Rfl:    Olopatadine  HCl 0.2 % SOLN, Instill 1 drop in each eye ONCE daily., Disp: 2.5 mL, Rfl: 12   oxymetazoline  (AFRIN) 0.05 % nasal spray, Place 1 spray into both nostrils 2 (two) times daily as needed for congestion., Disp: , Rfl:    Probiotic Product (DIGESTIVE ADVANTAGE PO), Take 1 tablet by mouth daily., Disp: , Rfl:    psyllium (METAMUCIL) 58.6 % packet, Take 1 packet by mouth daily., Disp: , Rfl:    triamcinolone  cream (KENALOG ) 0.1 %, APPLY  CREAM TOPICALLY TO AFFECTED AREA THREE TIMES DAILY AS NEEDED, Disp: 80 g, Rfl: 1   gabapentin  (NEURONTIN ) 600 MG tablet, Take 1 tablet (600 mg total) by mouth 3 (three) times daily., Disp: 270 tablet, Rfl: 4   hydrochlorothiazide  (HYDRODIURIL ) 25 MG tablet, TAKE 1 TABLET BY MOUTH ONCE DAILY STOP  CHLORTHALIDONE , Disp: 90 tablet, Rfl: 3   hydrocortisone  (PROCTO-MED HC ) 2.5 % rectal cream, Place rectally 2 (two) times daily as needed for hemorrhoids or anal itching. For no more than 3-5 days per flareup, Disp: 28 g, Rfl: 0   lisinopril  (ZESTRIL ) 40 MG tablet, Take 1 tablet (40 mg total) by mouth daily., Disp: 90 tablet, Rfl: 3   pravastatin  (PRAVACHOL ) 20 MG tablet, Take 1 tablet (20 mg total) by mouth daily., Disp: 90 tablet, Rfl: 3   tiZANidine  (ZANAFLEX ) 2 MG tablet, Take 1 tablet (2 mg total) by mouth every 8 (eight) hours as needed for muscle spasms., Disp: 20 tablet, Rfl: 2  Allergies  Allergen Reactions   Shrimp [Shellfish Allergy] Hives, Nausea And Vomiting and Rash    only shrimp - crustaceans - blisters, n/v      ROS: Review of Systems Pertinent items noted in HPI and remainder of comprehensive ROS otherwise negative.    Physical exam BP 120/64   Pulse 75   Temp 97.9  F (36.6 C) (Temporal)   Ht 5' 8.5 (1.74 m)   Wt 178 lb 6.4 oz (80.9 kg)   SpO2  98%   BMI 26.73 kg/m  General appearance: alert, cooperative, appears stated age, and no distress Head: Normocephalic, without obvious abnormality, atraumatic Eyes: negative findings: lids and lashes normal, conjunctivae and sclerae normal, corneas clear, and pupils equal, round, reactive to light and accomodation Ears: normal TM's and external ear canals both ears Nose: Nares normal. Septum midline. Mucosa normal. No drainage or sinus tenderness. Throat: lips, mucosa, and tongue normal; teeth and gums normal Neck: no adenopathy, supple, symmetrical, trachea midline, thyroid  not enlarged, symmetric, no tenderness/mass/nodules, and cardiac murmur appreciated radiating to the carotids Back: Mild increased kyphosis of the thoracic spine.  Patient is ambulating independently with normal gait and station Lungs: clear to auscultation bilaterally Chest wall: no tenderness Heart: Regular rate and rhythm.,  S1, S2 heard. 3/6 systolic ejection murmur radiated to the carotids appreciated bilaterally Abdomen: soft, non-tender; bowel sounds normal; no masses,  no organomegaly Extremities: extremities normal, atraumatic, no cyanosis or edema Pulses: 2+ and symmetric Skin: Several seborrheic keratoses and pigmented nevi noted throughout the neck and trunk Lymph nodes: Cervical, supraclavicular, and axillary nodes normal. Neurologic: Grossly normal      12/04/2023    8:57 AM 07/07/2023    8:01 AM 01/03/2023   10:04 AM  Depression screen PHQ 2/9  Decreased Interest 0 0 0  Down, Depressed, Hopeless 0 0 0  PHQ - 2 Score 0 0 0  Altered sleeping 0 0 0  Tired, decreased energy 0 0 0  Change in appetite 0 0 0  Feeling bad or failure about yourself  0 0 0  Trouble concentrating 0 0 0  Moving slowly or fidgety/restless 0 0 0  Suicidal thoughts 0 0 0  PHQ-9 Score 0 0 0  Difficult doing work/chores Not difficult at all Not  difficult at all Not difficult at all      07/07/2023    8:00 AM 01/03/2023   10:04 AM 12/14/2022    8:57 AM 06/03/2022    9:28 AM  GAD 7 : Generalized Anxiety Score  Nervous, Anxious, on Edge 0 0 0 0  Control/stop worrying 0 0 0 0  Worry too much - different things 0 0 0 0  Trouble relaxing 0 1 0 0  Restless 0 0 0 0  Easily annoyed or irritable 0 0 0 0  Afraid - awful might happen 0 0 0 0  Total GAD 7 Score 0 1 0 0  Anxiety Difficulty Not difficult at all Somewhat difficult Not difficult at all Not difficult at all     Assessment/ Plan: [name] here for annual physical exam.   Annual physical exam  Primary hypertension - Plan: CMP14+EGFR, hydrochlorothiazide  (HYDRODIURIL ) 25 MG tablet, lisinopril  (ZESTRIL ) 40 MG tablet  Hypertriglyceridemia - Plan: CMP14+EGFR, Lipid Panel, pravastatin  (PRAVACHOL ) 20 MG tablet  Macrocytic anemia - Plan: CMP14+EGFR, Anemia Profile B  Benign prostatic hyperplasia with incomplete bladder emptying - Plan: CMP14+EGFR, PSA  Spinal stenosis of lumbar region with neurogenic claudication - Plan: CMP14+EGFR, gabapentin  (NEURONTIN ) 600 MG tablet  Osteoarthritis of spine with myelopathy, lumbar region - Plan: gabapentin  (NEURONTIN ) 600 MG tablet, tiZANidine  (ZANAFLEX ) 2 MG tablet  Neuropathic pain - Plan: gabapentin  (NEURONTIN ) 600 MG tablet, tiZANidine  (ZANAFLEX ) 2 MG tablet  Grade I hemorrhoids - Plan: hydrocortisone  (PROCTO-MED HC ) 2.5 % rectal cream  History of colorectal cancer - Plan: hydrocortisone  (PROCTO-MED HC ) 2.5 % rectal cream  Patient is up-to-date on preventative health care.  Blood pressure well-controlled medications have been renewed.  Check renal function, liver  enzymes and electrolytes  Fasting lipid panel collected.  Statin renewed  Check anemia profile given macrocytic anemia history  Check PSA given BPH  Gabapentin  working well for treatment of spinal stenosis with neurogenic claudication.  He declined referral to  orthopedics given improvement in symptoms with medications.  Procto-Med renewed for as needed use.  No active lesions currently  Counseled on healthy lifestyle choices, including diet (rich in fruits, vegetables and lean meats and low in salt and simple carbohydrates) and exercise (at least 30 minutes of moderate physical activity daily).  Aaylah Pokorny M. Jolinda, DO

## 2023-12-16 LAB — ANEMIA PROFILE B
Basophils Absolute: 0.1 x10E3/uL (ref 0.0–0.2)
Basos: 1 %
EOS (ABSOLUTE): 0.3 x10E3/uL (ref 0.0–0.4)
Eos: 4 %
Ferritin: 156 ng/mL (ref 30–400)
Folate: >20 ng/mL (ref >3.0)
Hematocrit: 35.4 % — ABNORMAL LOW (ref 37.5–51.0)
Hemoglobin: 11.8 g/dL — ABNORMAL LOW (ref 13.0–17.7)
Immature Grans (Abs): 0.1 x10E3/uL (ref 0.0–0.1)
Immature Granulocytes: 1 %
Iron Saturation: 24 (ref 15–55)
Iron: 68 ug/dL (ref 38–169)
Lymphocytes Absolute: 1.1 x10E3/uL (ref 0.7–3.1)
Lymphs: 14 %
MCH: 35 pg — ABNORMAL HIGH (ref 26.6–33.0)
MCHC: 33.3 g/dL (ref 31.5–35.7)
MCV: 105 fL — ABNORMAL HIGH (ref 79–97)
Monocytes Absolute: 1 x10E3/uL — ABNORMAL HIGH (ref 0.1–0.9)
Monocytes: 12 %
Neutrophils Absolute: 5.2 x10E3/uL (ref 1.4–7.0)
Neutrophils: 68 %
Platelets: 151 x10E3/uL (ref 150–450)
RBC: 3.37 x10E6/uL — ABNORMAL LOW (ref 4.14–5.80)
RDW: 13.8 % (ref 11.6–15.4)
Retic Ct Pct: 1.3 % (ref 0.6–2.6)
Total Iron Binding Capacity: 283 ug/dL (ref 250–450)
UIBC: 215 ug/dL (ref 111–343)
Vitamin B-12: 498 pg/mL (ref 232–1245)
WBC: 7.7 x10E3/uL (ref 3.4–10.8)

## 2023-12-16 LAB — CMP14+EGFR
ALT: 18 IU/L (ref 0–44)
AST: 18 IU/L (ref 0–40)
Albumin: 4.3 g/dL (ref 3.8–4.8)
Alkaline Phosphatase: 46 IU/L (ref 44–121)
BUN/Creatinine Ratio: 18 (ref 10–24)
BUN: 26 mg/dL (ref 8–27)
Bilirubin Total: 1.1 mg/dL (ref 0.0–1.2)
CO2: 21 mmol/L (ref 20–29)
Calcium: 9 mg/dL (ref 8.6–10.2)
Chloride: 102 mmol/L (ref 96–106)
Creatinine, Ser: 1.45 mg/dL — ABNORMAL HIGH (ref 0.76–1.27)
Globulin, Total: 1.7 g/dL (ref 1.5–4.5)
Glucose: 102 mg/dL — ABNORMAL HIGH (ref 70–99)
Potassium: 4.4 mmol/L (ref 3.5–5.2)
Sodium: 139 mmol/L (ref 134–144)
Total Protein: 6 g/dL (ref 6.0–8.5)
eGFR: 49 mL/min/1.73 — ABNORMAL LOW (ref >59)

## 2023-12-16 LAB — LIPID PANEL
Chol/HDL Ratio: 3.9 ratio (ref 0.0–5.0)
Cholesterol, Total: 151 mg/dL (ref 100–199)
HDL: 39 mg/dL — ABNORMAL LOW (ref >39)
LDL Chol Calc (NIH): 90 mg/dL (ref 0–99)
Triglycerides: 121 mg/dL (ref 0–149)
VLDL Cholesterol Cal: 22 mg/dL (ref 5–40)

## 2023-12-16 LAB — PSA: Prostate Specific Ag, Serum: 6.3 ng/mL — AB (ref 0.0–4.0)

## 2023-12-18 ENCOUNTER — Ambulatory Visit: Payer: Self-pay

## 2023-12-18 ENCOUNTER — Ambulatory Visit: Payer: Self-pay | Admitting: Family Medicine

## 2023-12-18 NOTE — Telephone Encounter (Signed)
 Patient aware of lab results and treatment plan suggestions per Suzen Hasten, RN

## 2023-12-18 NOTE — Telephone Encounter (Signed)
 Pt calling with questions about recent lab work. Pt was given lab synopsis per lab/PCP note. Pt was given number for urology. States that he does not need to speak to nephrology at this time. Phone was disconnected, attempted to reconnect with pt, failed.   Copied from CRM [id number]. Topic: Clinical - Lab/Test Results >> Dec 18, 2023 11:48 AM Carlyon D wrote: Reason for CRM: Pt called wants to speak to nurse

## 2023-12-18 NOTE — Telephone Encounter (Signed)
 FYI Only or Action Required?: FYI only for provider.  Patient was last seen in primary care on 12/15/2023 by Jolinda Norene HERO, DO.  Called Nurse Triage reporting No chief complaint on file..  Symptoms began today.  Interventions attempted: Nothing.  Symptoms are: stable.  Triage Disposition: No disposition on file.  Patient/caregiver understands and will follow disposition?:   Called for lab results.  Copied from CRM [id number]. Topic: Clinical - Lab/Test Results >> Dec 18, 2023 11:48 AM Carlyon D wrote: Reason for CRM: Pt called wants to speak to nurse Reason for Disposition  Nursing judgment or information in reference  Answer Assessment - Initial Assessment Questions 1. REASON FOR CALL: What is your main concern right now?     Called for lab results.  This RN was able to read PCP message and patient has phone number to f/u on PSA  Protocols used: No Guideline Available-A-[facility]

## 2023-12-28 DIAGNOSIS — H40033 Anatomical narrow angle, bilateral: Secondary | ICD-10-CM | POA: Diagnosis not present

## 2023-12-28 DIAGNOSIS — H43393 Other vitreous opacities, bilateral: Secondary | ICD-10-CM | POA: Diagnosis not present

## 2024-01-29 ENCOUNTER — Encounter: Payer: Self-pay | Admitting: Urology

## 2024-01-29 ENCOUNTER — Ambulatory Visit (INDEPENDENT_AMBULATORY_CARE_PROVIDER_SITE_OTHER): Admitting: Urology

## 2024-01-29 VITALS — BP 137/66 | HR 89

## 2024-01-29 DIAGNOSIS — R351 Nocturia: Secondary | ICD-10-CM

## 2024-01-29 DIAGNOSIS — R972 Elevated prostate specific antigen [PSA]: Secondary | ICD-10-CM | POA: Diagnosis not present

## 2024-01-29 LAB — URINALYSIS, ROUTINE W REFLEX MICROSCOPIC
Bilirubin, UA: NEGATIVE
Glucose, UA: NEGATIVE
Ketones, UA: NEGATIVE
Leukocytes,UA: NEGATIVE
Nitrite, UA: NEGATIVE
Protein,UA: NEGATIVE
RBC, UA: NEGATIVE
Specific Gravity, UA: 1.025 (ref 1.005–1.030)
Urobilinogen, Ur: 0.2 mg/dL (ref 0.2–1.0)
pH, UA: 6 (ref 5.0–7.5)

## 2024-01-29 MED ORDER — ALFUZOSIN HCL ER 10 MG PO TB24: 10.0000 mg | ORAL_TABLET | Freq: Every day | ORAL | 11 refills | Status: AC

## 2024-01-29 NOTE — Progress Notes (Signed)
 01/29/2024 10:44 AM   Tyler Diaz Tyler Diaz December 29, 1945 [id number]  Referring provider: Jolinda Norene HERO, DO [address] [place],  [place] [address]  Elevated PSA   HPI: Mr Tyler Diaz is a 78yo here for evaluation of elevated PSA. PSA was 3.1 two years ago then rose to 5.0 last year and was 6.3 this year. IPSS 14 QOL 2 on no BPh medication. Nocturia 3x. He has a rectal cancer and underwent surgical resection, chemotherapy and radiation. He has to wear a pad due to occasional fecal incontinence.    PMH: Past Medical History:  Diagnosis Date   Anemia    hx of   Arthritis    hands/knees   Blood clot in vein    RT ARM WITH PICC LINE    C. difficile colitis 08/19/2013   Colon cancer (HCC) 07/18/12 bx   rectum=Invasive adenocarcinoma w/ extracellular mucin,polyp=benign   GERD (gastroesophageal reflux disease)    OCCASIONAL   Hearing loss    partial greater on left   Heart murmur    Hematochezia    recent   Herpes zoster - R flank - with severe pain 03/11/2013   History of shingles    Hyperlipidemia    Hypertension    Hypothyroidism as child   hx of   Ileostomy - diverting loop - s/p takedown 06/28/2013 01/14/2013   Mitral regurgitation due to partially flail posterior mitral valve leaflet    Nocturia    Numbness    TOES / FINGERS DUE TO CHEMO   Peripheral vascular disease (HCC)    neuropathy due to cancer treatment (fingers and toes)   Radiation    FOR COLON CANCER   Rectal cancer (HCC)    Rheumatic fever as child   hx   Sinusitis    seasonal   Staphylococcus aureus bacteremia 07/12/2019   Staphylococcus aureus sepsis (HCC) 07/17/2019   Tinnitus    left ear   Umbilical hernia, incarcerated - s/p primary repair July 2014 01/03/2013    Surgical History: Past Surgical History:  Procedure Laterality Date   CARPAL TUNNEL RELEASE Right 2002   ILEOSTOMY N/A 12/27/2012   Procedure: DIVERTING LOOP ILEOSTOMY;  Surgeon: Elspeth KYM Schultze, MD;  Location: [facility];  Service: General;   Laterality: N/A;   ILEOSTOMY CLOSURE N/A 06/28/2013   Procedure: loop ILEOSTOMY TAKEDOWN examination of anesthesia ;  Surgeon: Elspeth KYM Schultze, MD;  Location: [facility];  Service: General;  Laterality: N/A;   INGUINAL HERNIA REPAIR Left    INGUINAL HERNIA REPAIR Bilateral 08/13/2015   Procedure: LAPAROSCOPIC BILATERAL INGUINAL HERNIA REPAIR WITH MESH;  Surgeon: Elspeth Schultze, MD;  Location: [facility];  Service: General;  Laterality: Bilateral;   INSERTION OF MESH Bilateral 08/13/2015   Procedure: INSERTION OF MESH;  Surgeon: Elspeth Schultze, MD;  Location: [facility];  Service: General;  Laterality: Bilateral;   LAPAROSCOPIC LOW ANTERIOR RESCECTION WITH COLOANAL ANASTOMOSIS N/A 12/27/2012   Procedure: LAPAROSCOPIC LOW ANTERIOR RESCECTION WITH COLOANAL ANASTOMOSIS;  Surgeon: Elspeth KYM Schultze, MD;  Location: [facility];  Service: General;  Laterality: N/A;   LAPAROSCOPIC LOW ANTERIOR RESECTION N/A 12/27/2012   Procedure: LAPAROSCOPIC LOW ANTERIOR RESECTION, COLO-ANAL ANASTOMOSIS, DIVERTING LOOP ILEOSTOMY,SPLENIC FLEXURE MOBILIZATION, PRIMARY INCARCERATED UMBILICAL HERNIA REPAIR;  Surgeon: Elspeth KYM Schultze, MD;  Location: [facility];  Service: General;  Laterality: N/A;   TEE WITHOUT CARDIOVERSION N/A 07/10/2013   Procedure: TRANSESOPHAGEAL ECHOCARDIOGRAM (TEE);  Surgeon: Oneil Parchment, MD;  Location: [facility];  Service: Cardiovascular;  Laterality: N/A;   TEE WITHOUT  CARDIOVERSION N/A 08/21/2013   Procedure: TRANSESOPHAGEAL ECHOCARDIOGRAM (TEE);  Surgeon: Jerel Balding, MD;  Location: [facility]OSCOPY;  Service: Cardiovascular;  Laterality: N/A;   TEE WITHOUT CARDIOVERSION N/A 07/16/2019   Procedure: TRANSESOPHAGEAL ECHOCARDIOGRAM (TEE) WITH PROPOFOL ;  Surgeon: Alvan Dorn FALCON, MD;  Location: [facility];  Service: Endoscopy;  Laterality: N/A;   TONSILLECTOMY     as child   UMBILICAL HERNIA REPAIR  12/27/2012   Procedure: PRIMARY REPAIR INCARCERATED UMBILICAL HERNIA;  Surgeon: Elspeth KYM Schultze, MD;  Location: [facility];  Service: General;;     Home Medications:  Allergies as of 01/29/2024       Reactions   Shrimp [shellfish Allergy] Hives, Nausea And Vomiting, Rash   only shrimp - crustaceans - blisters, n/v         Medication List        Accurate as of January 29, 2024 10:44 AM. If you have any questions, ask your nurse or doctor.          acetaminophen  500 MG tablet Commonly known as: TYLENOL  Take 1,000 mg by mouth every 6 (six) hours as needed for headache.   DIGESTIVE ADVANTAGE PO Take 1 tablet by mouth daily.   gabapentin  600 MG tablet Commonly known as: NEURONTIN  Take 1 tablet (600 mg total) by mouth 3 (three) times daily.   hydrochlorothiazide  25 MG tablet Commonly known as: HYDRODIURIL  TAKE 1 TABLET BY MOUTH ONCE DAILY STOP  CHLORTHALIDONE    hydrocortisone  2.5 % rectal cream Commonly known as: Procto-Med HC  Place rectally 2 (two) times daily as needed for hemorrhoids or anal itching. For no more than 3-5 days per flareup   ketoconazole  2 % cream Commonly known as: NIZORAL  Apply 1 application topically 2 (two) times daily.   lisinopril  40 MG tablet Commonly known as: ZESTRIL  Take 1 tablet (40 mg total) by mouth daily.   multivitamin with minerals Tabs tablet Take 1 tablet by mouth every morning.   Olopatadine  HCl 0.2 % Soln Instill 1 drop in each eye ONCE daily.   oxymetazoline  0.05 % nasal spray Commonly known as: AFRIN Place 1 spray into both nostrils 2 (two) times daily as needed for congestion.   pravastatin  20 MG tablet Commonly known as: PRAVACHOL  Take 1 tablet (20 mg total) by mouth daily.   psyllium 58.6 % packet Commonly known as: METAMUCIL Take 1 packet by mouth daily.   tiZANidine  2 MG tablet Commonly known as: ZANAFLEX  Take 1 tablet (2 mg total) by mouth every 8 (eight) hours as needed for muscle spasms.   triamcinolone  cream 0.1 % Commonly known as: KENALOG  APPLY  CREAM TOPICALLY TO AFFECTED AREA THREE TIMES DAILY AS NEEDED        Allergies:  Allergies   Allergen Reactions   Shrimp [Shellfish Allergy] Hives, Nausea And Vomiting and Rash    only shrimp - crustaceans - blisters, n/v     Family History: Family History  Problem Relation Age of Onset   Cancer Mother        abdominal ca ?ovarian?   Heart disease Father    Cancer Sister        liver cancer   Heart disease Brother    COPD Brother    Healthy Son    Healthy Son     Social History:  reports that he quit smoking about 51 years ago. His smoking use included cigarettes. He started smoking about 71 years ago. He has a 30 pack-year smoking history. He has never used smokeless tobacco. He reports  that he does not drink alcohol and does not use drugs.  ROS: All other review of systems were reviewed and are negative except what is noted above in HPI  Physical Exam: BP 137/66   Pulse 89   Constitutional:  Alert and oriented, No acute distress. HEENT: [place] AT, moist mucus membranes.  Trachea midline, no masses. Cardiovascular: No clubbing, cyanosis, or edema. Respiratory: Normal respiratory effort, no increased work of breathing. GI: Abdomen is soft, nontender, nondistended, no abdominal masses GU: No CVA tenderness.  Lymph: No cervical or inguinal lymphadenopathy. Skin: No rashes, bruises or suspicious lesions. Neurologic: Grossly intact, no focal deficits, moving all 4 extremities. Psychiatric: Normal mood and affect.  Laboratory Data: Lab Results  Component Value Date   WBC 7.7 12/15/2023   HGB 11.8 (L) 12/15/2023   HCT 35.4 (L) 12/15/2023   MCV 105 (H) 12/15/2023   PLT 151 12/15/2023    Lab Results  Component Value Date   CREATININE 1.45 (H) 12/15/2023    No results found for: PSA  No results found for: TESTOSTERONE  No results found for: HGBA1C  Urinalysis    Component Value Date/Time   COLORURINE YELLOW 07/11/2019 1745   APPEARANCEUR CLEAR 07/11/2019 1745   LABSPEC 1.018 07/11/2019 1745   PHURINE 5.0 07/11/2019 1745   GLUCOSEU NEGATIVE  07/11/2019 1745   HGBUR MODERATE (A) 07/11/2019 1745   BILIRUBINUR NEGATIVE 07/11/2019 1745   KETONESUR NEGATIVE 07/11/2019 1745   PROTEINUR NEGATIVE 07/11/2019 1745   UROBILINOGEN 0.2 08/17/2013 1752   NITRITE NEGATIVE 07/11/2019 1745   LEUKOCYTESUR NEGATIVE 07/11/2019 1745    Lab Results  Component Value Date   BACTERIA NONE SEEN 07/11/2019    Pertinent Imaging:  No results found for this or any previous visit.  No results found for this or any previous visit.  No results found for this or any previous visit.  No results found for this or any previous visit.  No results found for this or any previous visit.  No results found for this or any previous visit.  No results found for this or any previous visit.  No results found for this or any previous visit.   Assessment & Plan:    1. Elevated PSA (Primary) -IsoPSA, will call with results. If elevated we will proceed with MRi and fusion biopsy. If negative I will see him back in 6 months with a PSA - Urinalysis, Routine w reflex microscopic  2. Nocturia -uroxatral  10mg  daily  No follow-ups on file.  Belvie Clara, MD  [facility] [place]

## 2024-01-29 NOTE — Patient Instructions (Signed)

## 2024-02-08 ENCOUNTER — Encounter: Payer: Self-pay | Admitting: Urology

## 2024-02-13 ENCOUNTER — Telehealth: Payer: Self-pay

## 2024-02-13 NOTE — Telephone Encounter (Signed)
-----   Message from Belvie Clara sent at 02/13/2024  8:55 AM EDT ----- yes ----- Message ----- From: Malachy Slice, LPN Sent: [id number]  12:29 PM EDT To: Belvie LITTIE Clara, MD  Ok to proceed with,  If negative I will see him back in 6 months with a PSA

## 2024-02-13 NOTE — Telephone Encounter (Signed)
 Patient called and made aware of Iso Psa results and Dr. Little recommendations to f/u in 6 months with a psa.

## 2024-02-16 ENCOUNTER — Encounter: Payer: Self-pay | Admitting: Cardiology

## 2024-02-16 ENCOUNTER — Ambulatory Visit: Attending: Cardiology | Admitting: Cardiology

## 2024-02-16 VITALS — BP 118/76 | HR 80 | Ht 70.0 in | Wt 176.4 lb

## 2024-02-16 DIAGNOSIS — I340 Nonrheumatic mitral (valve) insufficiency: Secondary | ICD-10-CM

## 2024-02-16 DIAGNOSIS — I10 Essential (primary) hypertension: Secondary | ICD-10-CM

## 2024-02-16 DIAGNOSIS — E782 Mixed hyperlipidemia: Secondary | ICD-10-CM | POA: Diagnosis not present

## 2024-02-16 NOTE — Progress Notes (Signed)
 Clinical Summary Tyler Diaz is a 78 y.o.male seen today for follow up of the following medical problems.      1. Mitral regurgitation - 11/2017 echo LVEF 60%, moderate to severe MR. Partial flail cusp of posterior leaflet. LVIDs 3.6 - 08/2013 TEE flail segment of P2 scallop posterior leaflet due to ruptured chordae, moderate to severe MR   03/2019 TTE There is posterior leaflet prolapse and  a calcified density on the atrial side of the posterior leaflet that could  represent a partial flail subsegment of the posterior leaflet or perhaps  an old vegetation. There is  eccentric, anteriorly directed, moderate to severe mitral valve  Regurgitation    07/2019 TEE: Ruptured chordae attached to the posterior leaflet with  partial prolapse of P2. Chronic finding when compared to 07/2013 TEE. There  is eccentric anterior directed MR thats difficult to quantify due to  eccentricity, appears moderate to  severe.    02/2021 echo: LVEF 60-65%, no WMAs, indet diastolic, normal RV, moderate MR, LVIDs 2.9  02/2022 echo: LVEF 60-65%, mod to severe MR, LVIDs 2.6   03/2023 echo: LVEF 60-65%, eccentric probably severe MR, LVIDs 3.2 - no recent SOB/DOE, no LE edema. Does heavy yard work without symptoms   - no SOB/DOE, no recent edema      2. HTN - he is compliant with meds - home bp's typically 110s/60s   3. Hyperlipidemia  - compliant with pravastatin  - 11/2021 TC 139 TG 138 HDL 38 LDL 77 12/2022 TC 155 TG 138 HDL 41 LDL 90 - 12/2023 TC 848 TG 878 HDL 39 LDL 90   4. Colorectal cancer - s/p surgery, currently cancer free.   Past Medical History:  Diagnosis Date   Anemia    hx of   Arthritis    hands/knees   Blood clot in vein    RT ARM WITH PICC LINE    C. difficile colitis 08/19/2013   Colon cancer (HCC) 07/18/12 bx   rectum=Invasive adenocarcinoma w/ extracellular mucin,polyp=benign   GERD (gastroesophageal reflux disease)    OCCASIONAL   Hearing loss    partial greater on left    Heart murmur    Hematochezia    recent   Herpes zoster - R flank - with severe pain 03/11/2013   History of shingles    Hyperlipidemia    Hypertension    Hypothyroidism as child   hx of   Ileostomy - diverting loop - s/p takedown 06/28/2013 01/14/2013   Mitral regurgitation due to partially flail posterior mitral valve leaflet    Nocturia    Numbness    TOES / FINGERS DUE TO CHEMO   Peripheral vascular disease (HCC)    neuropathy due to cancer treatment (fingers and toes)   Radiation    FOR COLON CANCER   Rectal cancer (HCC)    Rheumatic fever as child   hx   Sinusitis    seasonal   Staphylococcus aureus bacteremia 07/12/2019   Staphylococcus aureus sepsis (HCC) 07/17/2019   Tinnitus    left ear   Umbilical hernia, incarcerated - s/p primary repair July 2014 01/03/2013     Allergies  Allergen Reactions   Shrimp [Shellfish Allergy] Hives, Nausea And Vomiting and Rash    only shrimp - crustaceans - blisters, n/v      Current Outpatient Medications  Medication Sig Dispense Refill   acetaminophen  (TYLENOL ) 500 MG tablet Take 1,000 mg by mouth every 6 (six) hours as needed for  headache.      alfuzosin  (UROXATRAL ) 10 MG 24 hr tablet Take 1 tablet (10 mg total) by mouth at bedtime. 30 tablet 11   gabapentin  (NEURONTIN ) 600 MG tablet Take 1 tablet (600 mg total) by mouth 3 (three) times daily. 270 tablet 4   hydrochlorothiazide  (HYDRODIURIL ) 25 MG tablet TAKE 1 TABLET BY MOUTH ONCE DAILY STOP  CHLORTHALIDONE  90 tablet 3   hydrocortisone  (PROCTO-MED HC ) 2.5 % rectal cream Place rectally 2 (two) times daily as needed for hemorrhoids or anal itching. For no more than 3-5 days per flareup 28 g 0   ketoconazole  (NIZORAL ) 2 % cream Apply 1 application topically 2 (two) times daily. 60 g 1   lisinopril  (ZESTRIL ) 40 MG tablet Take 1 tablet (40 mg total) by mouth daily. 90 tablet 3   Multiple Vitamin (MULTIVITAMIN WITH MINERALS) TABS Take 1 tablet by mouth every morning.      Olopatadine   HCl 0.2 % SOLN Instill 1 drop in each eye ONCE daily. 2.5 mL 12   oxymetazoline  (AFRIN) 0.05 % nasal spray Place 1 spray into both nostrils 2 (two) times daily as needed for congestion.     pravastatin  (PRAVACHOL ) 20 MG tablet Take 1 tablet (20 mg total) by mouth daily. 90 tablet 3   Probiotic Product (DIGESTIVE ADVANTAGE PO) Take 1 tablet by mouth daily.     psyllium (METAMUCIL) 58.6 % packet Take 1 packet by mouth daily.     tiZANidine  (ZANAFLEX ) 2 MG tablet Take 1 tablet (2 mg total) by mouth every 8 (eight) hours as needed for muscle spasms. 20 tablet 2   triamcinolone  cream (KENALOG ) 0.1 % APPLY  CREAM TOPICALLY TO AFFECTED AREA THREE TIMES DAILY AS NEEDED 80 g 1   No current facility-administered medications for this visit.     Past Surgical History:  Procedure Laterality Date   CARPAL TUNNEL RELEASE Right 2002   ILEOSTOMY N/A 12/27/2012   Procedure: DIVERTING LOOP ILEOSTOMY;  Surgeon: Elspeth KYM Schultze, MD;  Location: [facility];  Service: General;  Laterality: N/A;   ILEOSTOMY CLOSURE N/A 06/28/2013   Procedure: loop ILEOSTOMY TAKEDOWN examination of anesthesia ;  Surgeon: Elspeth KYM Schultze, MD;  Location: [facility];  Service: General;  Laterality: N/A;   INGUINAL HERNIA REPAIR Left    INGUINAL HERNIA REPAIR Bilateral 08/13/2015   Procedure: LAPAROSCOPIC BILATERAL INGUINAL HERNIA REPAIR WITH MESH;  Surgeon: Elspeth Schultze, MD;  Location: [facility];  Service: General;  Laterality: Bilateral;   INSERTION OF MESH Bilateral 08/13/2015   Procedure: INSERTION OF MESH;  Surgeon: Elspeth Schultze, MD;  Location: [facility];  Service: General;  Laterality: Bilateral;   LAPAROSCOPIC LOW ANTERIOR RESCECTION WITH COLOANAL ANASTOMOSIS N/A 12/27/2012   Procedure: LAPAROSCOPIC LOW ANTERIOR RESCECTION WITH COLOANAL ANASTOMOSIS;  Surgeon: Elspeth KYM Schultze, MD;  Location: [facility];  Service: General;  Laterality: N/A;   LAPAROSCOPIC LOW ANTERIOR RESECTION N/A 12/27/2012   Procedure: LAPAROSCOPIC LOW ANTERIOR RESECTION, COLO-ANAL  ANASTOMOSIS, DIVERTING LOOP ILEOSTOMY,SPLENIC FLEXURE MOBILIZATION, PRIMARY INCARCERATED UMBILICAL HERNIA REPAIR;  Surgeon: Elspeth KYM Schultze, MD;  Location: [facility];  Service: General;  Laterality: N/A;   TEE WITHOUT CARDIOVERSION N/A 07/10/2013   Procedure: TRANSESOPHAGEAL ECHOCARDIOGRAM (TEE);  Surgeon: Oneil Parchment, MD;  Location: [facility] END[facility]COPY;  Service: Cardiovascular;  Laterality: N/A;   TEE WITHOUT CARDIOVERSION N/A 08/21/2013   Procedure: TRANSESOPHAGEAL ECHOCARDIOGRAM (TEE);  Surgeon: Jerel Balding, MD;  Location: [facility] ENDOSCOPY;  Service: Cardiovascular;  Laterality: N/A;   TEE WITHOUT CARDIOVERSION N/A 07/16/2019   Procedure: TRANSESOPHAGEAL ECHOCARDIOGRAM (TEE)  WITH PROPOFOL ;  Surgeon: Alvan Dorn FALCON, MD;  Location: [facility];  Service: Endoscopy;  Laterality: N/A;   TONSILLECTOMY     as child   UMBILICAL HERNIA REPAIR  12/27/2012   Procedure: PRIMARY REPAIR INCARCERATED UMBILICAL HERNIA;  Surgeon: Elspeth KYM Schultze, MD;  Location: [facility];  Service: General;;     Allergies  Allergen Reactions   Shrimp [Shellfish Allergy] Hives, Nausea And Vomiting and Rash    only shrimp - crustaceans - blisters, n/v       Family History  Problem Relation Age of Onset   Cancer Mother        abdominal ca ?ovarian?   Heart disease Father    Cancer Sister        liver cancer   Heart disease Brother    COPD Brother    Healthy Son    Healthy Son      Social History Mr. Dion reports that he quit smoking about 51 years ago. His smoking use included cigarettes. He started smoking about 71 years ago. He has a 30 pack-year smoking history. He has never used smokeless tobacco. Mr. Huge reports no history of alcohol use.    Physical Examination Today's Vitals   02/16/24 0845  BP: 118/76  Pulse: 80  SpO2: 99%  Weight: 176 lb 6.4 oz (80 kg)  Height: 5' 10 (1.778 m)   Body mass index is 25.31 kg/m.  Gen: resting comfortably, no acute distress HEENT: no scleral icterus, pupils equal round  and reactive, no palptable cervical adenopathy,  CV: RRR, 3/6 systolic murmur apex, no jvd Resp: Clear to auscultation bilaterally GI: abdomen is soft, non-tender, non-distended, normal bowel sounds, no hepatosplenomegaly MSK: extremities are warm, no edema.  Skin: warm, no rash Neuro:  no focal deficits Psych: appropriate affect   Diagnostic Studies  03/2019 echo IMPRESSIONS     1. Left ventricular ejection fraction, by visual estimation, is 55 to  60%. The left ventricle has normal function. Normal left ventricular size.  There is mildly increased left ventricular hypertrophy.   2. Global right ventricle has normal systolic function.The right  ventricular size is normal. No increase in right ventricular wall  thickness.   3. Left atrial size was mildly dilated.   4. Right atrial size was normal.   5. Mild aortic valve annular calcification.   6. The mitral valve is abnormal. There is posterior leaflet prolapse and  a calcified density on the atrial side of the posterior leaflet that could  represent a partial flail subsegment of the posterior leaflet or perhaps  an old vegetation. There is  eccentric, anteriorly directed, moderate to severe mitral valve  regurgitation. PISA calculations are challenging with very eccentric jet,  but one image suggests MR radius is at least 1 cm which is consistent with  severe regurgitation. Suggest TEE for  further evaluation if clinically indicated.   7. The tricuspid valve is grossly normal. Tricuspid valve regurgitation  is mild.   8. The aortic valve is tricuspid Aortic valve regurgitation is trivial by  color flow Doppler.   9. The pulmonic valve was grossly normal. Pulmonic valve regurgitation is  mild by color flow Doppler.  10. Normal pulmonary artery systolic pressure.  11. The tricuspid regurgitant velocity is 2.36 m/s, and with an assumed  right atrial pressure of 3 mmHg, the estimated right ventricular systolic  pressure is  normal at 25.3 mmHg.  12. The inferior vena cava is normal in size with greater than 50%  respiratory variability, suggesting right atrial pressure of 3 mmHg.   In comparison to the previous echocardiogram(s): Previous Echo at [facility]  showed LV EF 60%, mild LAE, moderate to severe MR with a wall-impinging MR  jet and a flail cusp of the posterior leaflet. Mild MR, PI and dilated  IVC. These images were not able  to be viewed directly.  FINDINGS   Left Ventricle: Left ventricular ejection fraction, by visual estimation,  is 55 to 60%. The left ventricle has normal function. There is mildly  increased left ventricular hypertrophy. Normal left ventricular size.  Spectral Doppler shows Left ventricular   diastolic parameters were normal pattern of LV diastolic filling.      07/2019 TEE IMPRESSIONS     1. Left ventricular ejection fraction, by estimation, is 60 to 65%. The  left ventricle has normal function. The left ventrical has no regional  wall motion abnormalities. Left ventricular diastolic function could not  be evaluated.   2. Right ventricular systolic function is normal. The right ventricular  size is normal.   3. Left atrial size was moderately dilated. No left atrial/left atrial  appendage thrombus was detected.   4. Right atrial size was moderately dilated.   5. Ruptured chordae attached to the posterior leaflet with partial  prolapse of P2. Chronic finding when compared to 07/2013 TEE. There is  eccentric anterior directed MR thats difficult to quantify due to  eccentricity, appears moderate to severe. . The  mitral valve is abnormal. Moderate to severe MR mitral valve  regurgitation. No evidence of mitral stenosis.   6. Tricuspid valve regurgitation is mild to moderate.   7. The aortic valve is tricuspid. Aortic valve regurgitation is trivial .  No aortic stenosis is present.     02/2021 echo IMPRESSIONS     1. Left ventricular ejection fraction, by estimation,  is 60 to 65%. The  left ventricle has normal function. The left ventricle has no regional  wall motion abnormalities. Left ventricular diastolic parameters are  indeterminate.   2. Right ventricular systolic function is normal. The right ventricular  size is normal. There is mildly elevated pulmonary artery systolic  pressure.   3. Left atrial size was mildly dilated.   4. There is prolapse of a portion of the posterior MV leaflet with  resultant eccentric anterior MR. The MR VC is 0.6 cm. The MV/AV VTI ratio  is 1.3 supporting moderate MR. . The mitral valve is abnormal. Moderate  mitral valve regurgitation. No evidence  of mitral stenosis.   5. The aortic valve is tricuspid. Aortic valve regurgitation is mild. No  aortic stenosis is present.   6. The inferior vena cava is normal in size with greater than 50%  respiratory variability, suggesting right atrial pressure of 3 mmHg.      02/2022 echo IMPRESSIONS     1. Left ventricular ejection fraction, by estimation, is 60 to 65%. The  left ventricle has normal function. The left ventricle has no regional  wall motion abnormalities. There is mild left ventricular hypertrophy.  Left ventricular diastolic parameters  were normal. The average left ventricular global longitudinal strain is  -23.6 %. The global longitudinal strain is normal.   2. Right ventricular systolic function is normal. The right ventricular  size is normal. There is normal pulmonary artery systolic pressure.   3. Left atrial size was moderately dilated.   4. Prolapse of a portion of the posterior MV leaflet with eccentric  anterior MR. The eccentricity  limits ability to quantify severity. MV/AV  VTI ratio 1.5 suggesting severe MR. Visually MR looks to be moderate to  severe. . The mitral valve is abnormal.  Moderate to severe mitral valve regurgitation. No evidence of mitral  stenosis.   5. The tricuspid valve is abnormal.   6. The aortic valve is tricuspid.  There is mild calcification of the  aortic valve. There is mild thickening of the aortic valve. Aortic valve  regurgitation is not visualized. No aortic stenosis is present.   7. The inferior vena cava is normal in size with greater than 50%  respiratory variability, suggesting right atrial pressure of 3 mmHg.    03/2023 echo 1. Left ventricular ejection fraction, by estimation, is 60 to 65%. The  left ventricle has normal function. The left ventricle has no regional  wall motion abnormalities. There is mild left ventricular hypertrophy.  Left ventricular diastolic parameters  were normal. The average left ventricular global longitudinal strain is  -24.3 %. The global longitudinal strain is normal.   2. Right ventricular systolic function is normal. The right ventricular  size is normal. There is normal pulmonary artery systolic pressure.   3. Left atrial size was severely dilated.   4. Partial prolapse of a portion of the posterior MV leaflet with  eccentric anterior MR. The eccentricity of the jet makes quantification  more difficult. MV to AV VTI ratio is 1.7 suggesting severe MR. . The  mitral valve is abnormal. Severe mitral valve   regurgitation. No evidence of mitral stenosis.   5. The tricuspid valve is abnormal.   6. The aortic valve is tricuspid. Aortic valve regurgitation is not  visualized. No aortic stenosis is present.   7. The inferior vena cava is normal in size with greater than 50%  respiratory variability, suggesting right atrial pressure of 3 mmHg.    03/2023 carotid US  Summary:  Right Carotid: Velocities in the right ICA are consistent with a 1-39%  stenosis.                Non-hemodynamically significant plaque <50% noted in the  CCA. The                 ECA appears <50% stenosed.   Left Carotid: Velocities in the left ICA are consistent with a 1-39%  stenosis.               Non-hemodynamically significant plaque <50% noted in the  CCA. The                 ECA appears <50% stenosed.   Vertebrals:  Bilateral vertebral arteries demonstrate antegrade flow.  Subclavians: Normal flow hemodynamics were seen in bilateral subclavian               arteries.    Assessment and Plan  1. Mitral regurgitation - moderate to severe by echos over time without evidence of LV dysfunction or enlargement - he remains asymptomatic - repeat echo for ongoing surveillance.      2. HTN - he prefers HCTZ instead of chlorthaldone due to cost - has white coat HTN often elevated in clinic. - bp at goal, continue current meds   3. Hyperlipidemia - at goal, continue current meds  EKG today shows NSR  F/u 6 months      Dorn PHEBE Ross, M.D.

## 2024-02-16 NOTE — Patient Instructions (Signed)
 Medication Instructions:  Your physician recommends that you continue on your current medications as directed. Please refer to the Current Medication list given to you today.  *If you need a refill on your cardiac medications before your next appointment, please call your pharmacy*  Lab Work: None   If you have labs (blood work) drawn today and your tests are completely normal, you will receive your results only by: MyChart Message (if you have MyChart) OR A paper copy in the mail If you have any lab test that is abnormal or we need to change your treatment, we will call you to review the results.  Testing/Procedures: Your physician has requested that you have an echocardiogram. Echocardiography is a painless test that uses sound waves to create images of your heart. It provides your doctor with information about the size and shape of your heart and how well your heart's chambers and valves are working. This procedure takes approximately one hour. There are no restrictions for this procedure. Please do NOT wear cologne, perfume, aftershave, or lotions (deodorant is allowed). Please arrive 15 minutes prior to your appointment time.  Please note: We ask at that you not bring children with you during ultrasound (echo/ vascular) testing. Due to room size and safety concerns, children are not allowed in the ultrasound rooms during exams. Our front office staff cannot provide observation of children in our lobby area while testing is being conducted. An adult accompanying a patient to their appointment will only be allowed in the ultrasound room at the discretion of the ultrasound technician under special circumstances. We apologize for any inconvenience.   Follow-Up: At [facility], you and your health needs are our priority.  As part of our continuing mission to provide you with exceptional heart care, our providers are all part of one team.  This team includes your primary Cardiologist  (physician) and Advanced Practice Providers or APPs (Physician Assistants and Nurse Practitioners) who all work together to provide you with the care you need, when you need it.  Your next appointment:   6 month(s)  Provider:   You may see Alvan Carrier, MD or the following Advanced Practice Provider on your designated Care Team:   Almarie Crate, NP    We recommend signing up for the patient portal called MyChart.  Sign up information is provided on this After Visit Summary.  MyChart is used to connect with patients for Virtual Visits (Telemedicine).  Patients are able to view lab/test results, encounter notes, upcoming appointments, etc.  Non-urgent messages can be sent to your provider as well.   To learn more about what you can do with MyChart, go to [email or web address].   Other Instructions Thank you for choosing [facility]!

## 2024-02-16 NOTE — Addendum Note (Signed)
 Addended by: Kason Benak G on: 02/16/2024 09:12 AM   Modules accepted: Orders

## 2024-02-22 ENCOUNTER — Other Ambulatory Visit: Payer: Self-pay | Admitting: Family Medicine

## 2024-02-22 DIAGNOSIS — K640 First degree hemorrhoids: Secondary | ICD-10-CM

## 2024-02-22 DIAGNOSIS — Z85048 Personal history of other malignant neoplasm of rectum, rectosigmoid junction, and anus: Secondary | ICD-10-CM

## 2024-03-06 ENCOUNTER — Other Ambulatory Visit

## 2024-04-10 ENCOUNTER — Ambulatory Visit: Attending: Cardiology

## 2024-04-10 DIAGNOSIS — I340 Nonrheumatic mitral (valve) insufficiency: Secondary | ICD-10-CM

## 2024-04-10 LAB — ECHOCARDIOGRAM COMPLETE
AR max vel: 2.79 cm2
AV Peak grad: 4.3 mmHg
Ao pk vel: 1.04 m/s
Area-P 1/2: 2.99 cm2
Calc EF: 60.1 %
MV M vel: 5.49 m/s
MV Peak grad: 120.6 mmHg
P 1/2 time: 700 ms
S' Lateral: 2.8 cm
Single Plane A2C EF: 58.6 %
Single Plane A4C EF: 63.9 %

## 2024-05-31 ENCOUNTER — Other Ambulatory Visit: Payer: Self-pay | Admitting: Family Medicine

## 2024-05-31 DIAGNOSIS — Z85048 Personal history of other malignant neoplasm of rectum, rectosigmoid junction, and anus: Secondary | ICD-10-CM

## 2024-05-31 DIAGNOSIS — K640 First degree hemorrhoids: Secondary | ICD-10-CM

## 2024-06-08 ENCOUNTER — Ambulatory Visit: Payer: Self-pay | Admitting: Cardiology

## 2024-06-13 ENCOUNTER — Encounter: Payer: Self-pay | Admitting: *Deleted

## 2024-07-25 ENCOUNTER — Other Ambulatory Visit

## 2024-07-31 ENCOUNTER — Ambulatory Visit: Admitting: Urology

## 2024-08-22 ENCOUNTER — Ambulatory Visit: Admitting: Cardiology

## 2024-12-04 ENCOUNTER — Ambulatory Visit: Payer: Self-pay

## 2024-12-16 ENCOUNTER — Encounter: Payer: Self-pay | Admitting: Family Medicine
